# Patient Record
Sex: Female | Born: 1950
Health system: Southern US, Community
[De-identification: ages and names within clinical notes are randomized; demographics above are authoritative.]

## PROBLEM LIST (undated history)

## (undated) DIAGNOSIS — R55 Syncope and collapse: Secondary | ICD-10-CM

## (undated) DIAGNOSIS — E119 Type 2 diabetes mellitus without complications: Secondary | ICD-10-CM

## (undated) DIAGNOSIS — I10 Essential (primary) hypertension: Secondary | ICD-10-CM

## (undated) DIAGNOSIS — E871 Hypo-osmolality and hyponatremia: Secondary | ICD-10-CM

## (undated) DIAGNOSIS — Z992 Dependence on renal dialysis: Secondary | ICD-10-CM

## (undated) DIAGNOSIS — E785 Hyperlipidemia, unspecified: Secondary | ICD-10-CM

## (undated) DIAGNOSIS — D649 Anemia, unspecified: Secondary | ICD-10-CM

## (undated) DIAGNOSIS — D696 Thrombocytopenia, unspecified: Secondary | ICD-10-CM

## (undated) DIAGNOSIS — R413 Other amnesia: Secondary | ICD-10-CM

## (undated) DIAGNOSIS — N186 End stage renal disease: Secondary | ICD-10-CM

## (undated) DIAGNOSIS — I2510 Atherosclerotic heart disease of native coronary artery without angina pectoris: Secondary | ICD-10-CM

## (undated) DIAGNOSIS — H9190 Unspecified hearing loss, unspecified ear: Secondary | ICD-10-CM

## (undated) DIAGNOSIS — I255 Ischemic cardiomyopathy: Secondary | ICD-10-CM

## (undated) HISTORY — DX: Hypo-osmolality and hyponatremia: E87.1

## (undated) HISTORY — DX: Hyperlipidemia, unspecified: E78.5

## (undated) HISTORY — DX: Other amnesia: R41.3

## (undated) HISTORY — DX: Type 2 diabetes mellitus without complications: E11.9

## (undated) HISTORY — DX: Thrombocytopenia, unspecified: D69.6

## (undated) HISTORY — DX: Anemia, unspecified: D64.9

## (undated) HISTORY — DX: Unspecified hearing loss, unspecified ear: H91.90

---

## 2000-03-05 ENCOUNTER — Encounter: Payer: Self-pay | Admitting: Obstetrics and Gynecology

## 2000-03-05 ENCOUNTER — Encounter: Admission: RE | Admit: 2000-03-05 | Discharge: 2000-03-05 | Payer: Self-pay | Admitting: Obstetrics and Gynecology

## 2000-12-24 ENCOUNTER — Emergency Department (HOSPITAL_COMMUNITY): Admission: EM | Admit: 2000-12-24 | Discharge: 2000-12-24 | Payer: Self-pay | Admitting: *Deleted

## 2000-12-27 ENCOUNTER — Emergency Department (HOSPITAL_COMMUNITY): Admission: EM | Admit: 2000-12-27 | Discharge: 2000-12-27 | Payer: Self-pay | Admitting: Emergency Medicine

## 2000-12-31 ENCOUNTER — Emergency Department (HOSPITAL_COMMUNITY): Admission: EM | Admit: 2000-12-31 | Discharge: 2000-12-31 | Payer: Self-pay | Admitting: Emergency Medicine

## 2001-03-18 ENCOUNTER — Other Ambulatory Visit: Admission: RE | Admit: 2001-03-18 | Discharge: 2001-03-18 | Payer: Self-pay | Admitting: Obstetrics and Gynecology

## 2001-04-20 ENCOUNTER — Encounter: Admission: RE | Admit: 2001-04-20 | Discharge: 2001-04-20 | Payer: Self-pay | Admitting: Obstetrics and Gynecology

## 2001-04-20 ENCOUNTER — Encounter: Payer: Self-pay | Admitting: Obstetrics and Gynecology

## 2010-07-04 ENCOUNTER — Emergency Department (HOSPITAL_COMMUNITY): Admission: EM | Admit: 2010-07-04 | Discharge: 2010-07-04 | Payer: Self-pay | Admitting: Emergency Medicine

## 2011-03-10 ENCOUNTER — Inpatient Hospital Stay (HOSPITAL_COMMUNITY)
Admission: EM | Admit: 2011-03-10 | Discharge: 2011-03-20 | DRG: 853 | Disposition: A | Discharge status: Home Health | Payer: Self-pay | Attending: Family Medicine | Admitting: Family Medicine

## 2011-03-10 ENCOUNTER — Emergency Department (HOSPITAL_COMMUNITY): Payer: Self-pay

## 2011-03-10 DIAGNOSIS — A419 Sepsis, unspecified organism: Secondary | ICD-10-CM

## 2011-03-10 DIAGNOSIS — E1310 Other specified diabetes mellitus with ketoacidosis without coma: Secondary | ICD-10-CM | POA: Diagnosis present

## 2011-03-10 DIAGNOSIS — R578 Other shock: Secondary | ICD-10-CM

## 2011-03-10 DIAGNOSIS — A498 Other bacterial infections of unspecified site: Secondary | ICD-10-CM | POA: Diagnosis present

## 2011-03-10 DIAGNOSIS — E871 Hypo-osmolality and hyponatremia: Secondary | ICD-10-CM | POA: Diagnosis present

## 2011-03-10 DIAGNOSIS — R6521 Severe sepsis with septic shock: Secondary | ICD-10-CM | POA: Diagnosis present

## 2011-03-10 DIAGNOSIS — K5900 Constipation, unspecified: Secondary | ICD-10-CM | POA: Diagnosis not present

## 2011-03-10 DIAGNOSIS — N179 Acute kidney failure, unspecified: Secondary | ICD-10-CM

## 2011-03-10 DIAGNOSIS — R6520 Severe sepsis without septic shock: Secondary | ICD-10-CM | POA: Diagnosis present

## 2011-03-10 DIAGNOSIS — E1010 Type 1 diabetes mellitus with ketoacidosis without coma: Secondary | ICD-10-CM

## 2011-03-10 DIAGNOSIS — N10 Acute tubulo-interstitial nephritis: Secondary | ICD-10-CM | POA: Diagnosis present

## 2011-03-10 DIAGNOSIS — R634 Abnormal weight loss: Secondary | ICD-10-CM | POA: Diagnosis present

## 2011-03-10 DIAGNOSIS — E872 Acidosis: Secondary | ICD-10-CM

## 2011-03-10 DIAGNOSIS — E8779 Other fluid overload: Secondary | ICD-10-CM | POA: Diagnosis not present

## 2011-03-10 DIAGNOSIS — J9600 Acute respiratory failure, unspecified whether with hypoxia or hypercapnia: Secondary | ICD-10-CM | POA: Diagnosis present

## 2011-03-10 HISTORY — DX: Essential (primary) hypertension: I10

## 2011-03-10 LAB — RAPID URINE DRUG SCREEN, HOSP PERFORMED
Amphetamines: NOT DETECTED
Barbiturates: NOT DETECTED
Benzodiazepines: NOT DETECTED
Cocaine: NOT DETECTED
Opiates: NOT DETECTED
Tetrahydrocannabinol: NOT DETECTED

## 2011-03-10 LAB — URINALYSIS, ROUTINE W REFLEX MICROSCOPIC
Glucose, UA: 500 mg/dL — AB
Ketones, ur: 15 mg/dL — AB
Nitrite: NEGATIVE
Protein, ur: 100 mg/dL — AB
Specific Gravity, Urine: 1.025 (ref 1.005–1.030)
Urobilinogen, UA: 0.2 mg/dL (ref 0.0–1.0)
pH: 5.5 (ref 5.0–8.0)

## 2011-03-10 LAB — CBC
HCT: 40.7 % (ref 36.0–46.0)
MCH: 31.8 pg (ref 26.0–34.0)
MCHC: 36.6 g/dL — ABNORMAL HIGH (ref 30.0–36.0)
MCV: 86.8 fL (ref 78.0–100.0)
MCV: 86.5 fL (ref 78.0–100.0)
Platelets: 66 10*3/uL — ABNORMAL LOW (ref 150–400)
RBC: 3.99 MIL/uL (ref 3.87–5.11)
RDW: 12.7 % (ref 11.5–15.5)
WBC: 20.1 10*3/uL — ABNORMAL HIGH (ref 4.0–10.5)

## 2011-03-10 LAB — POCT I-STAT 3, VENOUS BLOOD GAS (G3P V)
Acid-base deficit: 9 mmol/L — ABNORMAL HIGH (ref 0.0–2.0)
O2 Saturation: 40 %
TCO2: 17 mmol/L (ref 0–100)
pCO2, Ven: 33.3 mmHg — ABNORMAL LOW (ref 45.0–50.0)
pO2, Ven: 25 mmHg — CL (ref 30.0–45.0)

## 2011-03-10 LAB — CK TOTAL AND CKMB (NOT AT ARMC)
CK, MB: 2.9 ng/mL (ref 0.3–4.0)
Relative Index: 2.6 — ABNORMAL HIGH (ref 0.0–2.5)
Total CK: 112 U/L (ref 7–177)

## 2011-03-10 LAB — COMPREHENSIVE METABOLIC PANEL
Albumin: 2.4 g/dL — ABNORMAL LOW (ref 3.5–5.2)
Alkaline Phosphatase: 134 U/L — ABNORMAL HIGH (ref 39–117)
BUN: 53 mg/dL — ABNORMAL HIGH (ref 6–23)
GFR calc Af Amer: 26 mL/min — ABNORMAL LOW (ref >60)
Potassium: 3.8 mEq/L (ref 3.5–5.1)
Sodium: 126 mEq/L — ABNORMAL LOW (ref 135–145)
Total Protein: 5.5 g/dL — ABNORMAL LOW (ref 6.0–8.3)

## 2011-03-10 LAB — BASIC METABOLIC PANEL
Calcium: 6.8 mg/dL — ABNORMAL LOW (ref 8.4–10.5)
Chloride: 102 mEq/L (ref 96–112)
Creatinine, Ser: 1.65 mg/dL — ABNORMAL HIGH (ref 0.4–1.2)
GFR calc Af Amer: 33 mL/min — ABNORMAL LOW (ref >60)
GFR calc Af Amer: 38 mL/min — ABNORMAL LOW (ref >60)
Potassium: 4.2 mEq/L (ref 3.5–5.1)

## 2011-03-10 LAB — GRAM STAIN

## 2011-03-10 LAB — DIFFERENTIAL
Eosinophils Relative: 0 % (ref 0–5)
Lymphs Abs: 0.3 10*3/uL — ABNORMAL LOW (ref 0.7–4.0)
Monocytes Relative: 4 % (ref 3–12)
Neutro Abs: 16 10*3/uL — ABNORMAL HIGH (ref 1.7–7.7)

## 2011-03-10 LAB — POCT CARDIAC MARKERS
CKMB, poc: 1.8 ng/mL (ref 1.0–8.0)
Troponin i, poc: <0.05 ng/mL (ref 0.00–0.09)

## 2011-03-10 LAB — GLUCOSE, CAPILLARY
Glucose-Capillary: 323 mg/dL — ABNORMAL HIGH (ref 70–99)
Glucose-Capillary: 207 mg/dL — ABNORMAL HIGH (ref 70–99)

## 2011-03-10 LAB — URINE MICROSCOPIC-ADD ON

## 2011-03-10 LAB — BRAIN NATRIURETIC PEPTIDE: Pro B Natriuretic peptide (BNP): 1125 pg/mL — ABNORMAL HIGH (ref 0.0–100.0)

## 2011-03-10 LAB — LIPASE, BLOOD: Lipase: <10 U/L — ABNORMAL LOW (ref 11–59)

## 2011-03-10 LAB — APTT: aPTT: 32 seconds (ref 24–37)

## 2011-03-10 LAB — TROPONIN I: Troponin I: 0.22 ng/mL — ABNORMAL HIGH (ref 0.00–0.06)

## 2011-03-11 ENCOUNTER — Inpatient Hospital Stay (HOSPITAL_COMMUNITY): Payer: Self-pay

## 2011-03-11 LAB — BASIC METABOLIC PANEL
BUN: 33 mg/dL — ABNORMAL HIGH (ref 6–23)
CO2: 15 mEq/L — ABNORMAL LOW (ref 19–32)
CO2: 24 mEq/L (ref 19–32)
Calcium: 7.3 mg/dL — ABNORMAL LOW (ref 8.4–10.5)
Calcium: 8 mg/dL — ABNORMAL LOW (ref 8.4–10.5)
Chloride: 111 mEq/L (ref 96–112)
Creatinine, Ser: 0.86 mg/dL (ref 0.4–1.2)
GFR calc Af Amer: >60 mL/min (ref >60)
GFR calc Af Amer: 58 mL/min — ABNORMAL LOW (ref >60)
GFR calc non Af Amer: 48 mL/min — ABNORMAL LOW (ref >60)
Glucose, Bld: 322 mg/dL — ABNORMAL HIGH (ref 70–99)
Glucose, Bld: 170 mg/dL — ABNORMAL HIGH (ref 70–99)
Potassium: 3.3 mEq/L — ABNORMAL LOW (ref 3.5–5.1)
Sodium: 134 mEq/L — ABNORMAL LOW (ref 135–145)
Sodium: 131 mEq/L — ABNORMAL LOW (ref 135–145)

## 2011-03-11 LAB — CBC
HCT: 36.7 % (ref 36.0–46.0)
MCHC: 35.7 g/dL (ref 30.0–36.0)
Platelets: 66 10*3/uL — ABNORMAL LOW (ref 150–400)
RDW: 12.7 % (ref 11.5–15.5)
WBC: 17.6 10*3/uL — ABNORMAL HIGH (ref 4.0–10.5)

## 2011-03-11 LAB — COMPREHENSIVE METABOLIC PANEL
ALT: 33 U/L (ref 0–35)
AST: 36 U/L (ref 0–37)
Albumin: 1.9 g/dL — ABNORMAL LOW (ref 3.5–5.2)
Alkaline Phosphatase: 105 U/L (ref 39–117)
Calcium: 7 mg/dL — ABNORMAL LOW (ref 8.4–10.5)
GFR calc Af Amer: 44 mL/min — ABNORMAL LOW (ref >60)
Potassium: 3.4 mEq/L — ABNORMAL LOW (ref 3.5–5.1)
Sodium: 133 mEq/L — ABNORMAL LOW (ref 135–145)
Total Protein: 4.7 g/dL — ABNORMAL LOW (ref 6.0–8.3)

## 2011-03-11 LAB — CARDIAC PANEL(CRET KIN+CKTOT+MB+TROPI)
Relative Index: 3.1 — ABNORMAL HIGH (ref 0.0–2.5)
Relative Index: INVALID (ref 0.0–2.5)
Total CK: 26 U/L (ref 7–177)
Troponin I: 0.05 ng/mL (ref 0.00–0.06)

## 2011-03-11 LAB — GLUCOSE, CAPILLARY
Glucose-Capillary: 235 mg/dL — ABNORMAL HIGH (ref 70–99)
Glucose-Capillary: 163 mg/dL — ABNORMAL HIGH (ref 70–99)
Glucose-Capillary: 185 mg/dL — ABNORMAL HIGH (ref 70–99)
Glucose-Capillary: 201 mg/dL — ABNORMAL HIGH (ref 70–99)
Glucose-Capillary: 162 mg/dL — ABNORMAL HIGH (ref 70–99)

## 2011-03-11 LAB — TSH: TSH: 0.843 u[IU]/mL (ref 0.350–4.500)

## 2011-03-12 ENCOUNTER — Inpatient Hospital Stay (HOSPITAL_COMMUNITY): Payer: Self-pay

## 2011-03-12 LAB — CBC
HCT: 33.1 % — ABNORMAL LOW (ref 36.0–46.0)
Hemoglobin: 11.7 g/dL — ABNORMAL LOW (ref 12.0–15.0)
MCH: 30.4 pg (ref 26.0–34.0)
MCHC: 35.3 g/dL (ref 30.0–36.0)
MCV: 86 fL (ref 78.0–100.0)

## 2011-03-12 LAB — BASIC METABOLIC PANEL
BUN: 22 mg/dL (ref 6–23)
CO2: 15 mEq/L — ABNORMAL LOW (ref 19–32)
Chloride: 109 mEq/L (ref 96–112)
Creatinine, Ser: 0.99 mg/dL (ref 0.4–1.2)
Glucose, Bld: 118 mg/dL — ABNORMAL HIGH (ref 70–99)

## 2011-03-12 LAB — GLUCOSE, CAPILLARY: Glucose-Capillary: 116 mg/dL — ABNORMAL HIGH (ref 70–99)

## 2011-03-12 LAB — URINE CULTURE: Culture  Setup Time: 201204241708

## 2011-03-12 LAB — POCT I-STAT 3, ART BLOOD GAS (G3+)
Bicarbonate: 14.6 mEq/L — ABNORMAL LOW (ref 20.0–24.0)
Patient temperature: 98.1
pH, Arterial: 7.454 — ABNORMAL HIGH (ref 7.350–7.400)
pO2, Arterial: 59 mmHg — ABNORMAL LOW (ref 80.0–100.0)

## 2011-03-12 MED ORDER — IOHEXOL 300 MG/ML  SOLN
80.0000 mL | Freq: Once | INTRAMUSCULAR | Status: AC | PRN
  Administered 2011-03-12: 80 mL via INTRAVENOUS

## 2011-03-13 DIAGNOSIS — N12 Tubulo-interstitial nephritis, not specified as acute or chronic: Secondary | ICD-10-CM

## 2011-03-13 LAB — GLUCOSE, CAPILLARY
Glucose-Capillary: 94 mg/dL (ref 70–99)
Glucose-Capillary: 147 mg/dL — ABNORMAL HIGH (ref 70–99)
Glucose-Capillary: 118 mg/dL — ABNORMAL HIGH (ref 70–99)

## 2011-03-13 LAB — BASIC METABOLIC PANEL
BUN: 18 mg/dL (ref 6–23)
CO2: 17 mEq/L — ABNORMAL LOW (ref 19–32)
Chloride: 105 mEq/L (ref 96–112)
Creatinine, Ser: 0.88 mg/dL (ref 0.4–1.2)
Glucose, Bld: 94 mg/dL (ref 70–99)

## 2011-03-13 LAB — CBC
HCT: 30.7 % — ABNORMAL LOW (ref 36.0–46.0)
MCH: 30.2 pg (ref 26.0–34.0)
MCV: 85 fL (ref 78.0–100.0)
Platelets: 48 10*3/uL — ABNORMAL LOW (ref 150–400)
RBC: 3.61 MIL/uL — ABNORMAL LOW (ref 3.87–5.11)

## 2011-03-13 LAB — MAGNESIUM: Magnesium: 2.1 mg/dL (ref 1.5–2.5)

## 2011-03-14 LAB — CBC
HCT: 33 % — ABNORMAL LOW (ref 36.0–46.0)
Platelets: 57 10*3/uL — ABNORMAL LOW (ref 150–400)
RDW: 13 % (ref 11.5–15.5)
WBC: 12.8 10*3/uL — ABNORMAL HIGH (ref 4.0–10.5)

## 2011-03-14 LAB — GLUCOSE, CAPILLARY
Glucose-Capillary: 95 mg/dL (ref 70–99)
Glucose-Capillary: 89 mg/dL (ref 70–99)

## 2011-03-14 LAB — PHOSPHORUS: Phosphorus: 2.9 mg/dL (ref 2.3–4.6)

## 2011-03-14 LAB — BASIC METABOLIC PANEL
GFR calc non Af Amer: >60 mL/min (ref >60)
Glucose, Bld: 84 mg/dL (ref 70–99)
Potassium: 3.6 mEq/L (ref 3.5–5.1)
Sodium: 129 mEq/L — ABNORMAL LOW (ref 135–145)

## 2011-03-14 LAB — MAGNESIUM: Magnesium: 1.7 mg/dL (ref 1.5–2.5)

## 2011-03-15 ENCOUNTER — Inpatient Hospital Stay (HOSPITAL_COMMUNITY): Payer: Self-pay

## 2011-03-15 LAB — GLUCOSE, CAPILLARY
Glucose-Capillary: 122 mg/dL — ABNORMAL HIGH (ref 70–99)
Glucose-Capillary: 208 mg/dL — ABNORMAL HIGH (ref 70–99)
Glucose-Capillary: 207 mg/dL — ABNORMAL HIGH (ref 70–99)

## 2011-03-15 LAB — CBC
HCT: 33.5 % — ABNORMAL LOW (ref 36.0–46.0)
Hemoglobin: 11.6 g/dL — ABNORMAL LOW (ref 12.0–15.0)
MCV: 87 fL (ref 78.0–100.0)
RBC: 3.85 MIL/uL — ABNORMAL LOW (ref 3.87–5.11)
WBC: 11.6 10*3/uL — ABNORMAL HIGH (ref 4.0–10.5)

## 2011-03-15 LAB — BASIC METABOLIC PANEL
BUN: 11 mg/dL (ref 6–23)
Chloride: 102 mEq/L (ref 96–112)
Glucose, Bld: 137 mg/dL — ABNORMAL HIGH (ref 70–99)
Potassium: 3.5 mEq/L (ref 3.5–5.1)

## 2011-03-16 ENCOUNTER — Encounter (HOSPITAL_COMMUNITY): Payer: Self-pay | Admitting: Radiology

## 2011-03-16 ENCOUNTER — Inpatient Hospital Stay (HOSPITAL_COMMUNITY): Payer: Self-pay

## 2011-03-16 LAB — GLUCOSE, CAPILLARY
Glucose-Capillary: 136 mg/dL — ABNORMAL HIGH (ref 70–99)
Glucose-Capillary: 121 mg/dL — ABNORMAL HIGH (ref 70–99)

## 2011-03-16 LAB — CBC
Hemoglobin: 11.5 g/dL — ABNORMAL LOW (ref 12.0–15.0)
MCH: 30.2 pg (ref 26.0–34.0)
MCHC: 34.1 g/dL (ref 30.0–36.0)
MCV: 88.5 fL (ref 78.0–100.0)

## 2011-03-16 LAB — BASIC METABOLIC PANEL
BUN: 8 mg/dL (ref 6–23)
Chloride: 103 mEq/L (ref 96–112)
Creatinine, Ser: 0.65 mg/dL (ref 0.4–1.2)
Glucose, Bld: 109 mg/dL — ABNORMAL HIGH (ref 70–99)

## 2011-03-16 MED ORDER — IOHEXOL 300 MG/ML  SOLN: 80.0000 mL | Freq: Once | INTRAMUSCULAR | Status: AC | PRN

## 2011-03-16 NOTE — Consult Note (Signed)
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  HISTORY OF PRESENT ILLNESS:  [name] [name] is a 60 year old Caucasian female with a past medical history significant for diabetes which has been untreated as well as has hypertension who was admitted to the Teaching Service with Family Medicine with hyperglycemia and altered mental status.  She had apparently become incoherent, had slurred speech and fallen on the bathtub.  She had antecedent history of 4 days ofnausea and vomiting and decreased oral intake.  She had lost 100 pounds last year becoming more fatigued and depressed.  Upon talking to the patient today, she actually states she has had flank pain for more than a year.  In the emergency room, she was found to have pyuria and was given Rocephin and fluid resuscitated.  She was admitted to the Teaching Service and ultimately at the intensive care unit.  In the interim, imaging of her including CT of the abdomen and pelvis has been done on March 12, 2011, and this shows near-complete replacement of her right kidney with a gas and emphysematous pyelonephritis.  Some of this gas collection extends into the retroperitoneum along the inferior vena cava and the aorta.  There is also emphysematous cystitis with gas in the posterolateral bladder walls.  The patient's culture from urine has yielded an E-coli that is actually fairly sensitive.  Her blood cultures have remained sterile.  We were  asked to assist in the management work of this patient with emphysematous pyelonephritis that is severe.  PAST MEDICAL HISTORY: 1. Untreated diabetes mellitus. 2. Hypertension.  PAST SURGICAL HISTORY:  None.  SOCIAL HISTORY:  The patient lives with other husband, denies tobacco, alcohol or illicit drug use.  FAMILY HISTORY:  Mother and father both had heart disease.  REVIEW OF SYSTEMS:  Described in history of illness, otherwise, 12-point review of systems is negative.  ALLERGIES:  No known drug allergies.  CURRENT MEDICATIONS:  Ceftriaxone, Lasix, Haldol, metronidazole, pantoprazole, potassium chloride, Tylenol, dobutamine, morphine sulfate.  PHYSICAL EXAMINATION:  VITAL SIGNS:  Temperature max in last 24 hours is 98.1, temperature current 97.8, blood pressure 119/59, pulse 73, respirations 22, pulse ox 94% on room air.  GENERAL:  Quite pleasant lady is eating her breakfast. HEENT:  Normocephalic, atraumatic.  Pupils are equal, round and reactive to light.  Sclerae anicteric.  Oropharynx clear. NECK:  Supple. CARDIOVASCULAR:  Regular rate and rhythm.  No murmurs, gallops, or rubs. LUNGS:  Clear to auscultation bilaterally without wheezes, rhonchi or rales. ABDOMEN:  Soft but tender to palpation in left upper and lower quadrants.  She has had some left-sided flank pain and  CVA tenderness. LOWER EXTREMITIES:  Minimal edema. NEUROLOGICAL:  Nonfocal.  LABORATORY DATA:  CT scan as described above with near complete replacement of the right renal parenchyma by gas consistent with emphysematous pyelonephritis and extension of the gas collection to the retroperitoneum with extension to the inferior vena cava and aorta along the level of the liver and the aortic bifurcation, emphysematous cystitis and gas in posterolateral bladder walls, bilateral pleural effusions right greater than left.  LABORATORY DATA:  CBC with differential white count 12.3, hemoglobin 10.9, platelets 48.   Metabolic panel sodium 129, potassium 3.1, chloride 105, bicarb 17, BUN and creatinine 18 and 0.88, calcium 7.5.  Blood gas done yesterday 745, 20.7 and 59.  Serum for gallstone done on March 12, 2011, 33.55.  MICROBIOLOGICAL DATA:  Urine cultures from March 10, 2011, grew E-coli which was resistant to ampicillin but otherwise sensitive to ampicillin, cefazolin, ceftriaxone, ciprofloxacin, gentamicin with Floxin, Macrobid, Tobramycin and Bactrim.  Blood culture from March 10, 2011, negative.  IMPRESSION AND RECOMMENDATIONS:  This is a 60 year old lady with severe emphysematous pyelonephritis with near replacement of one of her kidneys due to this infection. 1. Severe emphysematous pyelonephritis:  In my mind, this patient     needs a nephrectomy to clear this.  I do not see I am going to     clear this with prolonged antibiotics, she is clearly critically     ill.  I agree with the Rocephin.  The Escherichia coli is actually     likely the culprit that is causing the gas as well.  It is well-     known to cause emphysematous cystitis and pyelonephritis.  We had     added Flagyl to cover anaerobes.  This is not completely     unreasonable although there is not clear evidence of the anaerobes     involved here and as mentioned the Escherichia coli can do this     completely by itself without anaerobic involvement.  Again, the     main issue in my     mind is the need for nephrectomy. 2. Thrombocytopenia was present on admission and is likely due to DIC.  Thank you for this Infectious Disease consultation.     Acey Lav, MD     CV/MEDQ  D:  03/13/2011  T:  03/13/2011  Job:  [id number]  cc:   Deniece Portela A. Sheffield Slider, M.D. Martina Sinner, MD  Electronically Signed by Paulette Blanch DAM MD on 03/16/2011 09:09:19 PM

## 2011-03-17 ENCOUNTER — Other Ambulatory Visit: Payer: Self-pay | Admitting: Urology

## 2011-03-17 DIAGNOSIS — N12 Tubulo-interstitial nephritis, not specified as acute or chronic: Secondary | ICD-10-CM

## 2011-03-17 HISTORY — PX: OTHER SURGICAL HISTORY: SHX169

## 2011-03-17 LAB — ABO/RH: ABO/RH(D): O POS

## 2011-03-17 LAB — OSMOLALITY, URINE: Osmolality, Ur: 340 mOsm/kg — ABNORMAL LOW (ref 390–1090)

## 2011-03-17 LAB — OSMOLALITY: Osmolality: 279 mOsm/kg (ref 275–300)

## 2011-03-17 LAB — CBC
HCT: 31.7 % — ABNORMAL LOW (ref 36.0–46.0)
Hemoglobin: 10.7 g/dL — ABNORMAL LOW (ref 12.0–15.0)
MCH: 30.3 pg (ref 26.0–34.0)
MCHC: 33.8 g/dL (ref 30.0–36.0)
MCV: 89.8 fL (ref 78.0–100.0)
RDW: 13.4 % (ref 11.5–15.5)

## 2011-03-17 LAB — GLUCOSE, CAPILLARY
Glucose-Capillary: 146 mg/dL — ABNORMAL HIGH (ref 70–99)
Glucose-Capillary: 177 mg/dL — ABNORMAL HIGH (ref 70–99)
Glucose-Capillary: 79 mg/dL (ref 70–99)

## 2011-03-17 LAB — CULTURE, BLOOD (ROUTINE X 2)
Culture: NO GROWTH
Culture: NO GROWTH

## 2011-03-17 LAB — BASIC METABOLIC PANEL
BUN: 7 mg/dL (ref 6–23)
Calcium: 7.8 mg/dL — ABNORMAL LOW (ref 8.4–10.5)
Creatinine, Ser: 0.79 mg/dL (ref 0.4–1.2)
GFR calc Af Amer: >60 mL/min (ref >60)
GFR calc non Af Amer: >60 mL/min (ref >60)

## 2011-03-17 LAB — POCT I-STAT GLUCOSE: Operator id: 153281

## 2011-03-18 LAB — GLUCOSE, CAPILLARY
Glucose-Capillary: 103 mg/dL — ABNORMAL HIGH (ref 70–99)
Glucose-Capillary: 115 mg/dL — ABNORMAL HIGH (ref 70–99)
Glucose-Capillary: 135 mg/dL — ABNORMAL HIGH (ref 70–99)
Glucose-Capillary: 78 mg/dL (ref 70–99)

## 2011-03-18 LAB — BASIC METABOLIC PANEL
CO2: 26 mEq/L (ref 19–32)
Calcium: 7.8 mg/dL — ABNORMAL LOW (ref 8.4–10.5)
Chloride: 100 mEq/L (ref 96–112)
Creatinine, Ser: 0.53 mg/dL (ref 0.4–1.2)
GFR calc Af Amer: >60 mL/min (ref >60)
Glucose, Bld: 108 mg/dL — ABNORMAL HIGH (ref 70–99)

## 2011-03-18 LAB — CBC
HCT: 30.7 % — ABNORMAL LOW (ref 36.0–46.0)
Hemoglobin: 10.4 g/dL — ABNORMAL LOW (ref 12.0–15.0)
Hemoglobin: 10.8 g/dL — ABNORMAL LOW (ref 12.0–15.0)
MCH: 30.4 pg (ref 26.0–34.0)
MCHC: 33.9 g/dL (ref 30.0–36.0)
MCV: 91.7 fL (ref 78.0–100.0)
Platelets: 318 10*3/uL (ref 150–400)
RBC: 3.42 MIL/uL — ABNORMAL LOW (ref 3.87–5.11)
RBC: 3.5 MIL/uL — ABNORMAL LOW (ref 3.87–5.11)
WBC: 9.7 10*3/uL (ref 4.0–10.5)

## 2011-03-18 LAB — BETA-HYDROXYBUTYRIC ACID

## 2011-03-19 LAB — GLUCOSE, CAPILLARY
Glucose-Capillary: 173 mg/dL — ABNORMAL HIGH (ref 70–99)
Glucose-Capillary: 132 mg/dL — ABNORMAL HIGH (ref 70–99)
Glucose-Capillary: 142 mg/dL — ABNORMAL HIGH (ref 70–99)

## 2011-03-19 LAB — BASIC METABOLIC PANEL
BUN: 5 mg/dL — ABNORMAL LOW (ref 6–23)
Calcium: 8.1 mg/dL — ABNORMAL LOW (ref 8.4–10.5)
Creatinine, Ser: 0.54 mg/dL (ref 0.4–1.2)
GFR calc Af Amer: >60 mL/min (ref >60)
GFR calc non Af Amer: >60 mL/min (ref >60)

## 2011-03-19 LAB — CBC
MCH: 29.6 pg (ref 26.0–34.0)
MCV: 90.1 fL (ref 78.0–100.0)
Platelets: 359 10*3/uL (ref 150–400)
RDW: 12.7 % (ref 11.5–15.5)

## 2011-03-20 LAB — BASIC METABOLIC PANEL
CO2: 31 mEq/L (ref 19–32)
Calcium: 8.4 mg/dL (ref 8.4–10.5)
Chloride: 102 mEq/L (ref 96–112)
Glucose, Bld: 95 mg/dL (ref 70–99)
Sodium: 136 mEq/L (ref 135–145)

## 2011-03-20 LAB — CBC
HCT: 29.6 % — ABNORMAL LOW (ref 36.0–46.0)
Hemoglobin: 9.8 g/dL — ABNORMAL LOW (ref 12.0–15.0)
MCHC: 33.1 g/dL (ref 30.0–36.0)

## 2011-03-20 LAB — GLUCOSE, CAPILLARY
Glucose-Capillary: 154 mg/dL — ABNORMAL HIGH (ref 70–99)
Glucose-Capillary: 119 mg/dL — ABNORMAL HIGH (ref 70–99)
Glucose-Capillary: 141 mg/dL — ABNORMAL HIGH (ref 70–99)

## 2011-03-20 NOTE — Discharge Summary (Signed)
[name] [name], [name] [name]               ACCOUNT NO.:  [id number]  MEDICAL RECORD NO.:  [id number]           PATIENT TYPE:  I  LOCATION:  2601                         FACILITY:  [facility]  PHYSICIAN:  Leighton Roach Yasaman Kolek, M.D.DATE OF BIRTH:  May 29, 1951  DATE OF ADMISSION:  03/10/2011 DATE OF DISCHARGE:                              DISCHARGE SUMMARY   Please refer to history and physical for detailed history of present illness.  Briefly, this is a 60 year old female who has not seen a primary care provider in over 6 years who presented to the ED with altered level of consciousness, nausea and vomiting, and hyperglycemia. Upon admission, the patient did have an elevated anion gap, acidosis, and ketosis.  We started the patient on Glucommander to treat for DKA and we ordered urine cultures and blood cultures because the patient had a mild low-grade fever.  The patient was hypotensive, her blood pressures were running 70s to 80s systolic over 40s to 50s diastolic and she had a white count of 17 and she was tachycardic and tachypneic. Therefore, she did meet SIRS criteria initially.  When her urinalysis returned, there was a UTI found and thus the patient was considered uroseptic and the code sepsis was called in the ED.  The patient was started empirically on vancomycin and Zosyn, and CCM was called for consult.  The patient's blood pressures continued to remain low, despite receiving 4-5 liters of IV fluids both in the ED and when she reached ICU.  The next morning, CCM recommended that the fluid be discontinued due to possible pulmonary edema found on her chest x-ray that morning. Therefore, the patient's fluids were KVO'd, however, the patient's blood pressures continued to drop after the fluids were stopped, therefore, a dobutamine drip was started and the patient's blood pressures were stable on the dobutamine drip for 12 hours.  Infectious Disease consult was appreciated and they initially  recommended to change from vancomycin and Zosyn to Avelox, but when the urine culture returned with E. coli, Infectious Disease changed the antibiotic to Rocephin 1 g daily.  The dobutamine drip was stopped and the patient's blood pressures remained stable.  She was then transferred to the floor.  A renal ultrasound was ordered at the time of admission which showed abnormal shadowing emanating from the right kidney, question abscess with gas forming organisms or emphysematous pyelonephritis, recommend a CT scan, that was March 11, 2011.  On March 12, 2011, a CT abdomen and pelvis with contrast showed: Near complete replacement of the right renal parenchyma by gas consistent with emphysematous pyelonephritis, extension of gas collection into the retroperitoneum with extension along the IVC and aorta from the level of the liver to the aortic bifurcation, emphysematous cystitis with gas in the posterolateral bladder wall and bilateral pleural effusions greater on the right, cannot exclude a right lower lobe pneumonia.  Urology was on board and at that time he wanted to discuss the case with Interventional Radiology and his partner.  It was decided that we would repeat the CT scan on Monday, March 16, 2011, to see if the emphysematous  pyelonephritis was improving overall.  On March 16, 2011, a CT abdomen and pelvis with contrast was repeated and showed no substantial interval change in appearance of the right kidney. Imaging features remained consistent with emphysematous pyelonephritis. The gas associated with the posterior bladder wall on the previous study has almost completely resolved.  As of March 16, 2011, Urology has decided to pursue nephrectomy on either Mar 17, 2011, or Mar 18, 2011.  The patient is now under the  excellent care of the new [facility] Teaching Service team for the month of May. Please call me if you have any questions or  concerns.    ______________________________ [name], MD   ______________________________ Leighton Roach Anaia Frith, M.D.    ID/MEDQ  D:  03/17/2011  T:  03/17/2011  Job:  [id number]  Electronically Signed by [name] MD on 03/18/2011 78:29:56 PM Electronically Signed by Acquanetta Belling M.D. on 03/20/2011 11:33:50 AM

## 2011-03-21 LAB — CROSSMATCH
ABO/RH(D): O POS
Antibody Screen: NEGATIVE
Unit division: 0

## 2011-03-24 NOTE — Discharge Summary (Signed)
[name] [name], [name] [name]               ACCOUNT NO.:  [id number]  MEDICAL RECORD NO.:  [id number]           PATIENT TYPE:  I  LOCATION:  5526                         FACILITY:  [facility]  PHYSICIAN:  Nestor Ramp, MD        DATE OF BIRTH:  10/02/1951  DATE OF ADMISSION:  03/10/2011 DATE OF DISCHARGE:  03/20/2011                              DISCHARGE SUMMARY   PRIMARY CARE PROVIDER:  None.  REASON FOR HOSPITALIZATION:  Diabetic ketoacidosis.  DISCHARGE DIAGNOSES: 1. Known type 2 diabetes mellitus in diabetic ketoacidosis. 2. Emphysematous pyelonephritis.  DISCHARGE MEDICATIONS: 1. Tylenol 650 mg p.o. q.8 h. p.r.n. pain. 2. Colace 100 mg p.o. b.i.d. for constipation. 3. Lantus 20 units subcutaneously q.a.m. 4. NovoLog 1 unit for every 50 blood sugar greater than 150 t.i.d.     with meals. 5. Vicodin 5/500 one to two tablets p.o. q.6 h. p.r.n. pain. 6. Ibuprofen 200 mg one to two tablets q.4 h. p.r.n. pain. 7. Antibiotic.CONSULTS:  Urology.  PROCEDURES: 1. Right nephrectomy on Mar 17, 2011. 2. CT abdomen and pelvis on March 12, 2011, showing a replacement of     the right renal parenchyma by gas consistent with emphysematous     pyelonephritis and extension of the gas collection into the     retroperitoneum as well as emphysematous cystitis.  Repeat CT     abdomen and pelvis on March 16, 2011, showed no substantial     interval change in the appearance of the right kidney and imaging     consistent with emphysematous pyelonephritis with decrease in the     amount of retroperitoneal gas and resolution of gas in the     posterior bladder wall.  PERTINENT LABORATORY DATA:  From Mar 17, 2011, to Mar 20, 2011, blood culture no growth x5 days.  CBC on the date of discharge white blood count 5.3, hemoglobin 9.8.  BRIEF HOSPITAL COURSE:  This is a 60 year old female who presented with nausea, vomiting, confusion, hyperglycemia, and hypotension. She is a known diabetic and was in diabetic  ketoacidosis. She also had a diagnosis of right emphysematous pyelonephritis.   Please refer to the interim summary regarding the patient's hospital course from the date of admission on March 10, 2011, to March 16, 2011.   I followed the patient from Mar 17, 2011, to her time of discharge. 1. Type 2 diabetes mellitus in diabetic ketoacidosis.  The patient's     gap closed on March 10, 2011. Following the interim discharge summary, the patient did well for the     remainder of her hospitalization regarding her diabetes.  At the     time of discharge, the patient was on 20 units of Lantus in the     morning and was on a sliding scale insulin Novolog regimen.  The     patient was requiring 0-2 units for mealtime coverage at the time     of discharge. 2. Emphysematous pyelonephritis.  No improvement of emphysematous pyelonephritis on repeat CT scan despite several days of antibiotics. So Urology perfromed right nephrectomy on 03/16/2010. Surgery went well without  any operative or postoperative     complications. Please refer to operative report for full details. The patient remained afebrile and had good urine     output and good pain control.   At the time of this dictation, biopsy results of right kidney were still pending. Still questionable whether emphysematous pyelonephritis was caused by an     infection versus malignancy.   At the time of discharge, the patient's antibiotic was     switched to Ciprofloxacin and the patient was asked to continue it for 17 days.   DISCHARGE INSTRUCTIONS:   Follow-up with Urology in 1-2 weeks. The patient was also asked to make a followup appointment and to establish care with a primary care provider in 1-2 weeks.  The patient was given options including the option to be followed at the [facility].  FOLLOWUP ISSUES: 1. Follow up on the kidney biopsy results. 2. Follow up on control of type 2 diabetes.  DISCHARGE CONDITION:  The  patient was discharged to her sister's home with home health nursing arrangements.     ______________________________ Priscella Mann, MD   ______________________________ Nestor Ramp, MD    AO/MEDQ  D:  03/20/2011  T:  03/20/2011  Job:  [id number]  Electronically Signed by Priscella Mann MD on 03/21/2011 11:07:03 AM Electronically Signed by Denny Levy MD on 03/24/2011 09:54:06 AM

## 2011-03-30 NOTE — Op Note (Signed)
NAMEHELI, [name] [name]               ACCOUNT NO.:  [id number]  MEDICAL RECORD NO.:  [id number]           PATIENT TYPE:  LOCATION:                                 FACILITY:  PHYSICIAN:  Heloise Purpura, MD      DATE OF BIRTH:  [birth year]  DATE OF PROCEDURE:  03/17/2011 DATE OF DISCHARGE:                              OPERATIVE REPORT   PREOPERATIVE DIAGNOSIS:  Emphysematous right pyelonephritis.  POSTOPERATIVE DIAGNOSIS:  Emphysematous right pyelonephritis.  PROCEDURE:  Right open nephrectomy.  SURGEON:  Martina Sinner, MD.  ASSISTANT:  Heloise Purpura, MD.  ANESTHESIA:  General.  COMPLICATIONS:  None.  ESTIMATED BLOOD LOSS:  300 mL.  INTRAVENOUS FLUIDS:  2400 mL of crystalloid.  SPECIMEN:  Right kidney.  DISPOSITION OF SPECIMEN:  Pathology.  DRAINS:  #19 Blake retroperitoneal drain.  INDICATIONS:  [name] [name] is a 60 year old female who was recently admitted to the hospital with altered mental status changes.  She underwent CT imaging and renal ultrasound imaging which revealed evidence of severe emphysematous pyelonephritis with almost total destruction of her right kidney and evidence of air extending into her retroperitoneum.  She was clinically stable and initially managed with intravenous antibiotics.  Her urine culture subsequently returned positive for pansensitive E. coli.  She underwent initial conservative management and re-imaging, which revealed persistent severe emphysematous pyelonephritis.  It was recommended that she undergo right nephrectomy for definitive treatment of her infection.  The potential risks, complications, and alternative treatment options were discussed in detail and informed consent obtained.  DESCRIPTION OF PROCEDURE:  The patient was taken to the operating room and a general anesthetic was administered, where she had been maintained on culture specific antibiotics and was now placed in the supine position with her abdomen prepped  and draped in the usual sterile fashion.  A preoperative time-out was performed.  A site was selected approximately 2 fingerbreadths below the right costal margin and an incision was made in the right subcostal region and carried down through the subcutaneous tissue with electrocautery.  The anterior rectus fascia was then divided along with the underlying rectus muscle fibers.  The posterior rectus sheath was then sharply divided allowing entry into the peritoneal cavity.  The fascial layers of the abdomen and muscular layers of the abdomen were then opened along the length of the incision. Self-retaining Bookwalter retractor was placed.  Inspection of the abdomen revealed a very enlarged and firm right kidney.  The adjacent colon did appear to be somewhat inflamed and portions of the liver were densely adherent to the anterior peritoneal surface of the right kidney. Using careful right-angle dissection, the colon was mobilized medially thereby exposing the duodenum and the entire kidney.  The hepatorenal ligaments were also divided allowing the liver to be mobilized and retracted superiorly.  The duodenum was then carefully released from the kidney, which did require entry into Gerota's fascia.  Once the duodenum was mobilized adequately, it was also retracted carefully medially thereby exposing the entire kidney.  The lateral attachments to the kidney were then entered allowing the posterior and lateral kidney to be carefully freed from the  surrounding structures.  The ureter was not readily identified during the inferior dissection due to the extensive inflammatory reaction that was present.  There was noted to be a small amount of gross purulence during the procedure as well.  On further dissection medially, the inferior vena caval was identified and dissection of the inflamed perinephric tissue was performed over the inferior vena cava exposing it superiorly toward the main renal  hilum. The superior attachments of the kidney were carefully taken down with cautery dissection under right-angle direction.  Eventually, the main renal hilum was able to be isolated away from the surrounding structures, although the renal artery and renal vein were densely adherent to each other due to the intense inflammatory reaction.  At this point, it was felt that en bloc division of the renal pedicle would be prudent and therefore a 45 mm Flex-ETS stapler with the vascular staple load was used to staple and divide the main renal artery and renal vein en bloc.  The kidney specimen was able to be removed and the renal fossa was copiously irrigated with antibiotic irrigation.  A #19 Blake drain was brought through a separate stab incision in the right lower quadrant and positioned appropriately within the renal fossa.  It was secured to the skin with a nylon suture.  Additional irrigation was utilized and hemostasis appeared excellent.  The ascending colon was placed back into its normal anatomic position and preparations were made for closure.  The abdominal wall was closed in 2 layers with a running #1 looped PDS used to close the transversus abdominis and internal oblique layers along with the posterior rectus sheath.  A second #1 looped PDS suture was used to close the external oblique and anterior rectus fascia.  The superficial wound was then copiously irrigated and the skin was reapproximated with staples.  Sterile dressings were applied.  The patient appeared to tolerate the procedure well without complications.  She remained hemodynamically stable throughout the procedure.     Heloise Purpura, MD     LB/MEDQ  D:  03/17/2011  T:  03/18/2011  Job:  [id number]  Electronically Signed by Heloise Purpura MD on 03/30/2011 08:00:13 PM

## 2011-04-05 NOTE — H&P (Signed)
[name] [name], [name] [name]               ACCOUNT NO.:  [id number]  MEDICAL RECORD NO.:  [id number]           PATIENT TYPE:  I  LOCATION:  2312                         FACILITY:  [facility]  PHYSICIAN:  Veryl Winemiller A. Sheffield Slider, M.D.    DATE OF BIRTH:  1951-09-29  DATE OF ADMISSION:  03/10/2011 DATE OF DISCHARGE:                             HISTORY & PHYSICAL   PRIMARY CARE PROVIDER:  None, unassigned.  CHIEF COMPLAINT:  Hyperglycemia with altered mental status.  HISTORY OF PRESENT ILLNESS:  This is a 60 year old female with a history of diabetes who was not seen a physician in over 5 years, presenting with hyperglycemia, nausea and vomiting.  The patient is a poor historian.  Per family members, the patient has become incoherent with slurred speech, this started 2 days ago.  Yesterday, the patient fell into a bathtub.  She did not hit her head or lose consciousness. Additionally, the patient has had a 4-day history of nausea and vomiting and decreased p.o. intake.  Per family, the patient is "sluggish" at baseline.  She has also lost over 100 pounds in the last year and has becoming more fatigued and "depressed."  Sister checks the patient's CBGs or blood glucoses when she comes to visit her.  Lately, CBGs have been too high to detect.  ED COURSE:  The patient was found to have UTI.  She received one dose of IV ceftriaxone and for her low blood pressure, she received 2 liters of IV fluids.  We were called to admit the patient.  ALLERGIES:  No known drug allergies.  MEDICATIONS: 1. Motrin. 2. Benadryl. 3. Multivitamin.  PAST MEDICAL HISTORY:  Diabetes.  PAST SURGICAL HISTORY:  None.  SOCIAL HISTORY:  Lives with husband.  Denies any tobacco, alcohol or illicit drug use.  FAMILY HISTORY:  Mother and father both had heart disease.  REVIEW OF SYSTEMS:  Endorses decreased appetite, fatigue, change in weight, nausea, vomiting, one loose stool, weakness, dry skin and hair. Denies fever, chills,  headache, chest pain, cough, dyspnea, dysuria, or abdominal pain.  PHYSICAL EXAMINATION:  VITAL SIGNS:  Temperature 97.1, pulse 103, respirations 32, blood pressure 76/42, pulse ox 96% on 2 liters. GENERAL:  Awake, no acute distress. HEENT:  EOMI.  PERRLA.  Oropharynx is dry with poor dentition.  No exudates or erythema. NECK:  Supple, nontender.  No lymphadenopathy. CARDIOVASCULAR:  Regular rate and rhythm.  No murmurs. LUNGS:  Clear to auscultation bilaterally.  No wheezes, rales, or rhonchi. ABDOMEN:  Soft, nontender, nondistended.  Positive bowel sounds. EXTREMITIES:  No CCE. NEUROLOGIC:  Alert, awake and oriented x3, but cognitively slow.  No focal neuro deficits. MSK:  Dorsalis pedis pulses are strong and equal bilaterally.  LABS AND STUDIES:  White count 17, hemoglobin 14.9, hematocrit 40.7, platelets 76.  Sodium 126, potassium 3.8, chloride 92, bicarb 14, BUN 53, creatinine 2.27, platelets 322, lactic acid 2.6.  Chest x-ray showed central airway thickening, no acute disease.  Point-of-care enzymes were negative.  I-Stat; pH 7.3, pCO2 33.3, PO2 25, bicarb 16.4.  Urinalysis: Amber glucose 500, moderate bilirubin, 15 ketones, large blood, moderate leukocyte.  U-micro; 21-50  white blood cells, 21-50 red blood cells, many bacteria.  UDS negative.  ASSESSMENT AND PLAN:  This is a 60 year old female who was not seen a doctor in over 5 years, who presents with nausea, vomiting and hyperglycemia. 1. Hyperglycemia.  Anion gap is 17, likely diabetic ketoacidosis.  UA     showed 15 ketones.  She is a known diabetic, but she does not take     any medications nor check her blood sugars.  We will start a DKA     protocol.  Once her gap closes, we will change to Lantus and     sliding scale insulin.  We will need to get diabetic education     involved and social work to establish care once discharged from the     hospital.  We will get an A1c and a fasting lipid panel. 2. Urinary tract  infection.  The patient received ceftriaxone IV one     dose in the ED.  We will continue ceftriaxone daily until the     patient can tolerate oral meds.  We will consider changing to     Keflex at that time.  We are awaiting a repeat UA with a Gram stain     and urine cultures.  We will give Tylenol p.r.n. for any fever or     pain.  There is concern for sepsis, the patient is hypotensive with     an elevated white count and tachycardic.  We will consider empiric     antibiotics, vancomycin and Zosyn, if the patient's blood pressure     do not improve with hydration.  We will also consider calling CCM     for consult if this is truly urosepsis. 3. Weight loss likely secondary to her uncontrolled diabetes.  We may     need to supplement with Ensure or order a Nutrition consult.  We     will also check her TSH. 4. Acute kidney injury likely secondary to dehydration.  We will     continue IV fluids at 200 mL an hour and then may consider     discontinuing that in the morning. 5. Altered mental status and confusion likely secondary to dehydration     versus urinary tract infection.  The patient is confused and    sluggish at baseline per family, and we will continue to monitor     her mental status. 6. Prophylaxis.  Lovenox 40 subcu. 7. Fluids, electrolytes, nutrition/gastrointestinal.  N.p.o. 8. Disposition.  Pending clinical improvement.    ______________________________ [name], MD   ______________________________ [name], M.D.    ID/MEDQ  D:  03/10/2011  T:  03/11/2011  Job:  [id number]  Electronically Signed by [name] MD on 03/18/2011 08:37:31 PM Electronically Signed by Zachery Dauer M.D. on 04/05/2011 04:10:27 PM

## 2011-04-17 ENCOUNTER — Ambulatory Visit (INDEPENDENT_AMBULATORY_CARE_PROVIDER_SITE_OTHER): Payer: Self-pay | Admitting: Family Medicine

## 2011-04-17 ENCOUNTER — Encounter: Payer: Self-pay | Admitting: Family Medicine

## 2011-04-17 DIAGNOSIS — R5383 Other fatigue: Secondary | ICD-10-CM | POA: Insufficient documentation

## 2011-04-17 DIAGNOSIS — N12 Tubulo-interstitial nephritis, not specified as acute or chronic: Secondary | ICD-10-CM | POA: Insufficient documentation

## 2011-04-17 DIAGNOSIS — E119 Type 2 diabetes mellitus without complications: Secondary | ICD-10-CM

## 2011-04-17 DIAGNOSIS — K5900 Constipation, unspecified: Secondary | ICD-10-CM

## 2011-04-17 DIAGNOSIS — I959 Hypotension, unspecified: Secondary | ICD-10-CM

## 2011-04-17 LAB — CBC
HCT: 40.4 % (ref 36.0–46.0)
Hemoglobin: 13.3 g/dL (ref 12.0–15.0)
MCH: 30.6 pg (ref 26.0–34.0)
MCHC: 32.9 g/dL (ref 30.0–36.0)
MCV: 93.1 fL (ref 78.0–100.0)
Platelets: 326 K/uL (ref 150–400)
RBC: 4.34 MIL/uL (ref 3.87–5.11)
RDW: 14.4 % (ref 11.5–15.5)
WBC: 6.3 K/uL (ref 4.0–10.5)

## 2011-04-17 LAB — BASIC METABOLIC PANEL WITH GFR
BUN: 15 mg/dL (ref 6–23)
CO2: 28 meq/L (ref 19–32)
Calcium: 9.7 mg/dL (ref 8.4–10.5)
Chloride: 104 meq/L (ref 96–112)
Creat: 0.85 mg/dL (ref 0.50–1.10)
Glucose, Bld: 109 mg/dL — ABNORMAL HIGH (ref 70–99)
Potassium: 4.2 meq/L (ref 3.5–5.3)
Sodium: 140 meq/L (ref 135–145)

## 2011-04-17 LAB — TSH: TSH: 1.198 u[IU]/mL (ref 0.350–4.500)

## 2011-04-17 NOTE — Progress Notes (Signed)
  Subjective:    Patient ID: [name] [name], female    DOB: 01-15-1951, 60 y.o.   MRN: [id number]  HPI Hospital follow-up following 04/25-05/04 admission.  1. T2DM, presenting with DKA in hospital Checks sugars at home in the morning. Ranging from 7-151. Feels lightheaded when sugars are on the lower side.  Taking Lantus 20 units in the morning. Takes Novolog 1 unit for sugars >150. Has only had to take Novolog 3 times since hospital D/C on 05/04.    2. R emphysematous pyelonephritis s/p R nephrectomy  Saw urologist recently. Told that she was doing well. ROS: no difficulty with urination; no fevers; no hematuria.   3. Fatigue Expected to be more energetic following hospital discharge. Not worsening fatigue.  Able to stay active during the day. Gets tired near the end of the day.   4. Constipation Sometimes 1 week without bowel movement.  Takes Citrucel daily and Colace twice daily.  Takes oxycodone/percocet occasionally for her side pain following surgery/anxiety.  Review of Systems    Objective:   Physical Exam General: NAD, pale? HEENT: dry MM CV: RRR Pulm: CTAB Abd: soft, non-tender, NABS, right-sided incision healing well, clean, dry; no CVA TTP Ext: no pedal edema, warm Skin: 4-5 sec capillary refill    Assessment & Plan:

## 2011-04-17 NOTE — Patient Instructions (Addendum)
For your constipation, try increasing the Citrucel to twice or three times daily. You can also try Miralax 1-2 capfuls once or twice daily as needed. Please also try to drink more water or other liquids. Let's aim for 6 cups daily. Try eating foods with fiber and try prune juice as well.   For your diabetes, cut back on your Lantus to 18 units every morning. Continue to measure your sugars at least once a day. Please write these down and bring them to me at your next visit. Do not take the extra insulin any more. If you sugars are consistently over 150 (for more than 3 days in a row), please call the clinic and let me know.   For your low blood pressure, I think it's due to dehydration. Please make sure you stay well hydrated, especially when it's warm outside and you are active.

## 2011-04-18 DIAGNOSIS — I959 Hypotension, unspecified: Secondary | ICD-10-CM | POA: Insufficient documentation

## 2011-04-18 DIAGNOSIS — K5900 Constipation, unspecified: Secondary | ICD-10-CM | POA: Insufficient documentation

## 2011-04-18 DIAGNOSIS — E119 Type 2 diabetes mellitus without complications: Secondary | ICD-10-CM | POA: Insufficient documentation

## 2011-04-18 NOTE — Assessment & Plan Note (Signed)
Likely 2/2 deconditioning. Patient not anemic today. Anemia has improved since D/C from hospital early May 2012.

## 2011-04-18 NOTE — Assessment & Plan Note (Signed)
Persistent on occasional narcotics. Recommended increasing Citrucel to bid-tid. Also increase fiber in diet and PO intake of liquids. Goal 6 cups daily. Patient appears dry on PE today.

## 2011-04-18 NOTE — Assessment & Plan Note (Addendum)
Doing well. Will f/u Urology June 12, 2011. Cr doing fine today.

## 2011-04-18 NOTE — Assessment & Plan Note (Addendum)
Occasional symptomatic hypoglycemia on Lantus 20. Will decrease to Lantus 18. Discontinuing Novolog. Asked patient to keep checking sugars daily for records and to call if consistently over 150. Will need to start ACEi at some point for renal protection but will hold off now due to low blood pressure today.

## 2011-04-18 NOTE — Assessment & Plan Note (Signed)
BP low today in the 90s/60s. Likely 2/2 dehydration. Patient appears dry on PE with dry MM and increased cap refill. Does not complain of symptoms for low BP, however. Encouraged increasing PO intake of liquids to about 6 cups daily.

## 2011-07-14 ENCOUNTER — Encounter: Payer: Self-pay | Admitting: Family Medicine

## 2011-07-14 ENCOUNTER — Ambulatory Visit (INDEPENDENT_AMBULATORY_CARE_PROVIDER_SITE_OTHER): Payer: Self-pay | Admitting: Family Medicine

## 2011-07-14 VITALS — BP 84/60 | HR 76 | Temp 97.9°F | Ht 64.0 in | Wt 132.5 lb

## 2011-07-14 DIAGNOSIS — E119 Type 2 diabetes mellitus without complications: Secondary | ICD-10-CM

## 2011-07-14 DIAGNOSIS — M62830 Muscle spasm of back: Secondary | ICD-10-CM | POA: Insufficient documentation

## 2011-07-14 DIAGNOSIS — N12 Tubulo-interstitial nephritis, not specified as acute or chronic: Secondary | ICD-10-CM

## 2011-07-14 DIAGNOSIS — M549 Dorsalgia, unspecified: Secondary | ICD-10-CM

## 2011-07-14 LAB — POCT URINALYSIS DIPSTICK
Bilirubin, UA: NEGATIVE
Blood, UA: NEGATIVE
Glucose, UA: NEGATIVE
Nitrite, UA: NEGATIVE
Spec Grav, UA: 1.025
Urobilinogen, UA: 0.2

## 2011-07-14 LAB — POCT GLYCOSYLATED HEMOGLOBIN (HGB A1C): Hemoglobin A1C: 7.1

## 2011-07-14 MED ORDER — CYCLOBENZAPRINE HCL 10 MG PO TABS: 10.0000 mg | ORAL_TABLET | Freq: Three times a day (TID) | ORAL | Status: DC | PRN

## 2011-07-14 NOTE — Patient Instructions (Signed)
Try the muscle relaxant to see if it helps your symptoms. You may continue to take the Motrin every 12 hours but make sure you stay well-hydrated (8-10 glasses of fluids daily).  Follow-up with me next Friday and we can re-assess this.

## 2011-07-15 ENCOUNTER — Encounter: Payer: Self-pay | Admitting: Family Medicine

## 2011-07-15 LAB — BASIC METABOLIC PANEL
CO2: 27 mEq/L (ref 19–32)
Chloride: 103 mEq/L (ref 96–112)
Potassium: 4.1 mEq/L (ref 3.5–5.3)
Sodium: 141 mEq/L (ref 135–145)

## 2011-07-15 LAB — SEDIMENTATION RATE: Sed Rate: 5 mm/hr (ref 0–22)

## 2011-07-15 LAB — CBC
HCT: 40.9 % (ref 36.0–46.0)
Hemoglobin: 13.4 g/dL (ref 12.0–15.0)
MCH: 29.6 pg (ref 26.0–34.0)
MCHC: 32.8 g/dL (ref 30.0–36.0)
RBC: 4.53 MIL/uL (ref 3.87–5.11)

## 2011-07-15 NOTE — Progress Notes (Signed)
  Subjective:    Patient ID: [name] [name], female    DOB: 09-28-1951, 60 y.o.   MRN: [id number]  HPI Reports doing poorly overall since I last saw her.  Complaining of significant pain down most of her spine and along her flank and abdomen.  These symptoms started about a year ago. Thought that the pain was due to her emphysematous pyelonephritis (hospitalized and s/p nephrectomy 03/2011) but the back is back.   ES Tylenol doesn't help pain but Motrin bid prn does help.  Warm compresses do not help. Not sure what aggravates pain, which is constant.  ROS: no loss of bladder/bowel, no numbness. Some bilateral arm tingling occasionally. Denies constipation, nausea/vomiting. Denies recent weight loss or decreased appetite.   Recent stressors: problems with her disability. Very frustrated because the process is taking so long and is repeatedly being asked to fill out new paperwork.   Review of Systems     Objective:   Physical Exam  Constitutional: She appears distressed.       Tearful, very anxious-appearing. Uncomfortable, lying on exam table when I entered room.   HENT:  Head: Normocephalic and atraumatic.  Mouth/Throat: Oropharynx is clear and moist.  Neck: Normal range of motion. Neck supple. No thyromegaly present.  Cardiovascular: Normal rate, regular rhythm, normal heart sounds and intact distal pulses.  Exam reveals no gallop.   No murmur heard. Pulmonary/Chest: Effort normal and breath sounds normal.  Abdominal: Soft. Bowel sounds are normal. She exhibits no distension and no mass. There is tenderness. There is guarding. There is no rebound.       Obese with loose skin Tenderness starting from bilateral flanks to lower abdomen. Symmetric distribution.   Musculoskeletal: Normal range of motion. She exhibits no edema.       Tender along bony prominences and immediately lateral bilaterally to her upper thoracic and lower lumbar spine.  No tenderness in her back muscles.     Lymphadenopathy:    She has no cervical adenopathy.  Neurological: She is alert. She has normal strength. She displays no atrophy and no tremor. No cranial nerve deficit or sensory deficit. She exhibits normal muscle tone. Coordination and gait normal.  Skin: Skin is warm and dry. No rash noted. She is not diaphoretic. No erythema. There is pallor.  Psychiatric: Her speech is normal. Judgment and thought content normal. Her mood appears anxious. Her affect is not angry, not blunt, not labile and not inappropriate. She is agitated. She is not aggressive, is not hyperactive, not slowed and not withdrawn. Cognition and memory are normal. She does not exhibit a depressed mood.          Assessment & Plan:

## 2011-07-15 NOTE — Assessment & Plan Note (Addendum)
Persistent problem for the past year but first time she is being seen by me for this. Differential is broad: may be musculoskeletal, electrolytes, neurologic, GI, urinary.  Right now, musculoskeletal seems most likely based on how tender she is to palpation, how diffuse the tenderness is down her spine and on her flank and abdomen, and absence of neurological signs (she is complaining of arm tingling, but her the tenderness does not seem to be in her cervical spine). Will try flexeril to see if this helps and continue low dose Motrin (being wary of her solitary kidney). Also encouraged stretching as tolerated. She was getting Vicodin but will not refill at this time, especially since she is not taking it and has not requested any.  Electrolytes are normal except for her urea today (Cr is okay) and CRP/ESR are also normal, making inflammatory process unlikely.  Urinalysis is negative for signs of infection.  Stress likely also contributing. Consider starting SSRI/SNRI. I considered getting images of her back, but I am not quite sure yet what I would be looking for based on such a broad distribution of her back pain. I am not sure what an x-ray would show, and I do not think getting an MRI is appropriate at this time.  Multiple myeloma is also possible with bony tenderness and fatigue, but less likely since she has no protein in her urine, normal calcium, and normal CBC.  Will follow-up with me in the next 1-2 weeks.   Addendum: based on rising Cr, avoid NSAIDs. Continue flexeril q8. May take Tylenol. Asked her to call by Friday if her pain is still severe without Motrin (currently pain is improved). May give Rx for Tramadol.

## 2011-07-15 NOTE — Assessment & Plan Note (Addendum)
We did not discuss this issue today but I wanted to include an addendum based on her labs. Her Cr is creeping up. Was 0.5 in early May. Now 0.95. Called patient and asked her to stop taking Motrin and increase fluid intake from her current 5-6 cups to 8-10 cups.

## 2011-07-15 NOTE — Assessment & Plan Note (Signed)
Fair control based on A1c. Will discuss more in detail in future; didn't have time to address this issue in depth due to other issues. Patient's sugars seem to be usually in the 150s (based on her sugar log). Asked her to bring log at next visit.

## 2011-07-28 ENCOUNTER — Ambulatory Visit (INDEPENDENT_AMBULATORY_CARE_PROVIDER_SITE_OTHER): Payer: Self-pay | Admitting: Family Medicine

## 2011-07-28 ENCOUNTER — Encounter: Payer: Self-pay | Admitting: Family Medicine

## 2011-07-28 VITALS — BP 62/48 | HR 100 | Temp 97.9°F | Ht 64.0 in | Wt 133.8 lb

## 2011-07-28 DIAGNOSIS — I959 Hypotension, unspecified: Secondary | ICD-10-CM

## 2011-07-28 DIAGNOSIS — N179 Acute kidney failure, unspecified: Secondary | ICD-10-CM | POA: Insufficient documentation

## 2011-07-28 DIAGNOSIS — M62830 Muscle spasm of back: Secondary | ICD-10-CM

## 2011-07-28 DIAGNOSIS — M549 Dorsalgia, unspecified: Secondary | ICD-10-CM

## 2011-07-28 DIAGNOSIS — M5380 Other specified dorsopathies, site unspecified: Secondary | ICD-10-CM

## 2011-07-28 MED ORDER — FLUDROCORTISONE ACETATE 0.1 MG PO TABS: 0.1000 mg | ORAL_TABLET | Freq: Every day | ORAL | Status: DC

## 2011-07-28 MED ORDER — CYCLOBENZAPRINE HCL 10 MG PO TABS: 10.0000 mg | ORAL_TABLET | Freq: Three times a day (TID) | ORAL | Status: DC | PRN

## 2011-07-28 NOTE — Assessment & Plan Note (Signed)
Pain significantly improved with Flexeril. Continue this and cold packs. Also recommended daily stretching.

## 2011-07-28 NOTE — Assessment & Plan Note (Signed)
Positive orthostatic today. I think she is still dehydrated and has an element of autonomic dysfunction as well (2/2 diabetes?). Continue to encourage her to drink more fluids. Will also start Florinef due to her being symptomatic. Will also check cortisol to rule-out Addison's disease.  I am also concerned her Cr has been rising, and I am concerned this is due to her hypoperfusing her kidneys. Although Flexeril may cause hypotension, will continue this for now since she reports significant improvement with this medication, and she was hypotensive with similar sitting blood pressures before starting this.  Follow-up in 2 weeks.

## 2011-07-28 NOTE — Progress Notes (Signed)
  Subjective:    Patient ID: [name] [name], female    DOB: 01-Jul-1951, 60 y.o.   MRN: [id number]  HPI 1. Back pain improved with Flexeril and cold packs  2. Dizziness and low blood pressure.  Persistently low blood pressure Complaining of dizziness recently Drinking 4-5 glasses liquids daily. Having difficult time drinking more. Feels full.  ROS: denies recent falls, difficultly urinating, heart palpatations/chest pain  Review of Systems Per HPI    Objective:   Physical Exam  Constitutional: She is oriented to person, place, and time.       Tired-appearing  HENT:       Dry mucous membranes  Cardiovascular: Normal rate, regular rhythm, normal heart sounds and intact distal pulses.  Exam reveals no gallop.   No murmur heard. Pulmonary/Chest: Effort normal and breath sounds normal. She has no rales.  Musculoskeletal: She exhibits no edema.  Neurological: She is alert and oriented to person, place, and time.  Skin: Skin is warm and dry. There is pallor.       3-4 sec capillary refill  Psychiatric:       Appears anxious-appearing (baseline for patient)  Back: mild TTP diffusely across lumbar back     Assessment & Plan:

## 2011-07-28 NOTE — Patient Instructions (Addendum)
Try the medication (Florinef) for your blood pressure and to help with your dizziness.   If your lab results are normal, I will send you a letter with the results. If abnormal, someone at the clinic will get in touch with you.   Follow-up with me in 2 weeks.

## 2011-07-28 NOTE — Assessment & Plan Note (Signed)
Cr has been trending upward since her hospital discharge in May 2012. 0.51 (May 2012), 0.85 (June 2012), 0.95 (August 2012). BUN/Cr ratio based on labs a few weeks ago in 20:1 ratio suggest dehydration. I think this and possibly decreased perfusion of her kidneys due to her low blood pressures may be contributing. Starting Florinef for her blood pressures. Encouraged increased hydration. She had no protein in a recent urinalysis, so I do not think her diabetes is contributing to this issue, especially with her HgbA1c almost being at goal. Will re-check BMET today.

## 2011-07-29 LAB — BASIC METABOLIC PANEL
BUN: 24 mg/dL — ABNORMAL HIGH (ref 6–23)
Chloride: 101 mEq/L (ref 96–112)
Potassium: 4.1 mEq/L (ref 3.5–5.3)
Sodium: 137 mEq/L (ref 135–145)

## 2011-07-29 LAB — CORTISOL: Cortisol, Plasma: 8.7 ug/dL

## 2011-07-30 ENCOUNTER — Encounter: Payer: Self-pay | Admitting: Family Medicine

## 2011-08-12 ENCOUNTER — Ambulatory Visit: Payer: Self-pay | Admitting: Family Medicine

## 2011-08-28 ENCOUNTER — Ambulatory Visit (INDEPENDENT_AMBULATORY_CARE_PROVIDER_SITE_OTHER): Payer: Medicaid Other | Admitting: Family Medicine

## 2011-08-28 ENCOUNTER — Encounter: Payer: Self-pay | Admitting: Family Medicine

## 2011-08-28 VITALS — BP 118/77 | HR 92 | Temp 98.0°F | Wt 135.0 lb

## 2011-08-28 DIAGNOSIS — N179 Acute kidney failure, unspecified: Secondary | ICD-10-CM

## 2011-08-28 DIAGNOSIS — F329 Major depressive disorder, single episode, unspecified: Secondary | ICD-10-CM

## 2011-08-28 DIAGNOSIS — F32A Depression, unspecified: Secondary | ICD-10-CM

## 2011-08-28 DIAGNOSIS — I959 Hypotension, unspecified: Secondary | ICD-10-CM

## 2011-08-28 DIAGNOSIS — Z23 Encounter for immunization: Secondary | ICD-10-CM

## 2011-08-28 DIAGNOSIS — M62830 Muscle spasm of back: Secondary | ICD-10-CM

## 2011-08-28 DIAGNOSIS — M5380 Other specified dorsopathies, site unspecified: Secondary | ICD-10-CM

## 2011-08-28 MED ORDER — FLUOXETINE HCL 20 MG PO CAPS: 20.0000 mg | ORAL_CAPSULE | Freq: Every day | ORAL | Status: DC

## 2011-08-28 NOTE — Assessment & Plan Note (Signed)
Persistent. Continue Flexeril and cold packs. Strongly encouraged her to do exercises daily. May refer her to PT in the future if stretching on her own isn't enough.

## 2011-08-28 NOTE — Assessment & Plan Note (Signed)
Blood pressure significantly improved today. Not quite sure why. May be due to Florinef, although she has not taken for a few days now. May be due to better hydration, although she still struggles with drinking adequately throughout the day. Cortisol level was negative when checked previously and Cr finally stabilized after creeping up for the past several weeks.  Told her she may continue to take Florinef if it helps her lightheadedness.  Will re-assess in 2 weeks when she is here for depression follow-up.

## 2011-08-28 NOTE — Assessment & Plan Note (Signed)
Blood pressure improved. Will not check Cr today. Cr had stabilized at last visit around 0.95.

## 2011-08-28 NOTE — Progress Notes (Signed)
  Subjective:    Patient ID: [name] [name], female    DOB: [birth year], 60 y.o.   MRN: [id number]  HPI This is a 60 YO F with a history of T2DM insulin-dependent in non-obese individual, chronically low blood pressure with positive orthostatics, depression, s/p nephrectomy from emphysematous pyelonephritis in 2012. Here for follow-up of:  1. Orthostatic hypotension Last took Florinef two days ago. Needs to get it refilled. Thinks may have helped lightheaded spells. Still gets them and lasts a few seconds with position changes but not as often. Denies falls. ROS: denies chest pain, headache, changes in vision/hearing, nausea/vomiting  2. Right back spasm Flexeril helps. Flexeril makes her sleepy 4-5 hours after taking.  Worse with certain positions. Does not do back exercises.   ROS: denies fevers, dysuria.  3. Depression Feels depressed. No SI/HI. But taking more naps during the day and more tired overall. Saw psychiatrist in the past when she was in college. But he made her do strange things (like talking to her hands). Did not help that much in the past.  Going through a lot right now fighting with insurance companies who are not agreeing to pay her hospital bills.  Review of Systems Per HPI.     Objective:   Physical Exam Gen: NAD Psych: appears anxious and depressed, sometimes looks like she is going to cry, but appropriate to questions, engaged, normal thought content and judgement MSK:   Back: no spasms with ROM with pain on right flank area worse with flexion on that side and also elicited by extension CV: RRR, no m/r/g Pulm: CTAB Ext: warm, dry, 2+ pulses pedal and radial    Assessment & Plan:

## 2011-08-28 NOTE — Patient Instructions (Signed)
Start the Prozac for your mood.  Follow-up with me in 2 weeks.  It may make you feel more anxious, nauseated, and cause headaches at first. These symptoms should improve. Decrease your dose by half if these symptoms are bothersome.   Continue the flexeril for your back spasms. Try those back exercises daily to help strengthen your muscles.  You may continue the Florinef if it is helping your dizziness. Try to stay hydrated. You are doing a good job trying to do this.

## 2011-08-28 NOTE — Assessment & Plan Note (Signed)
Long-standing problem I think but something we first addressed today. Going through a lot right now with her arguing with insurance companies trying to get hospital bills paid. Will start Prozac today to see if it will help her fatigue (taking several naps during the day, has low energy), decreased appetite/difficulty staying hydrated, and mood.  Discussed possibly talking to psychologist. Patient open to idea despite negative experience when she had psychiatrist during college. But not sure if she can afford at this time. Husband would like her to see psychologist. Will call her insurance to see if they will cover to see Dr. Pascal Lux. Will follow-up in 2 weeks now that she is starting Prozac.

## 2011-09-14 ENCOUNTER — Encounter: Payer: Self-pay | Admitting: Family Medicine

## 2011-09-14 ENCOUNTER — Ambulatory Visit (INDEPENDENT_AMBULATORY_CARE_PROVIDER_SITE_OTHER): Payer: Medicaid Other | Admitting: Family Medicine

## 2011-09-14 VITALS — BP 97/63 | HR 86 | Temp 98.0°F | Ht 64.0 in | Wt 135.0 lb

## 2011-09-14 DIAGNOSIS — F329 Major depressive disorder, single episode, unspecified: Secondary | ICD-10-CM

## 2011-09-14 DIAGNOSIS — I959 Hypotension, unspecified: Secondary | ICD-10-CM

## 2011-09-14 DIAGNOSIS — N179 Acute kidney failure, unspecified: Secondary | ICD-10-CM

## 2011-09-14 LAB — BASIC METABOLIC PANEL
CO2: 28 mEq/L (ref 19–32)
Calcium: 9.7 mg/dL (ref 8.4–10.5)
Chloride: 103 mEq/L (ref 96–112)
Glucose, Bld: 112 mg/dL — ABNORMAL HIGH (ref 70–99)
Sodium: 139 mEq/L (ref 135–145)

## 2011-09-14 MED ORDER — FLUOXETINE HCL 20 MG PO CAPS: 40.0000 mg | ORAL_CAPSULE | Freq: Every day | ORAL | Status: DC

## 2011-09-14 NOTE — Progress Notes (Signed)
  Subjective:    Patient ID: [name] [name], female    DOB: [birth year], 60 y.o.   MRN: [id number]  HPI 1. Low blood pressure  Occasionally gets lightheaded. Taking Florinef tablet helps. Takes about every other day.  Denies falling.  2. Depression Not sure if the medication is making a difference. Still feels tired during the day. Husband feels patient is able to concentrate better (able to read books for hours at a time), but sometimes her gait seems shorter and she seems more moody. ROS: denies HI/SI, hopelessness.  Review of Systems Per HPI.    Objective:   Physical Exam Gen: NAD, accompanied by husband Psych: appears less depressed than usual, still appears a little anxious, seems more engaged and has better eye contact; does not appear tearful CV: RRR Pulm: CTAB Ext: warm, dry, 2+ pedal pulses Neuro: no focal deficits    Assessment & Plan:

## 2011-09-14 NOTE — Assessment & Plan Note (Signed)
Seems to be improved today than it was at her last office visit on 10/12 before starting Prozac. Husband reports though that patient still appears fatigued and may be more moody. I think it is worth going up on this medication to see if it will help her fatigue since her demeanor seems brighter than it usually is. We will increase the Prozac from 20 mg to 40 mg daily and follow-up in 1 month. Husband and patient is amenable to this plan. He is amenable to seeing if a higher dose will help her despite his concerns about her mood, which do not seem to bother him significantly.  Follow-up in 1 month.

## 2011-09-14 NOTE — Assessment & Plan Note (Signed)
Systolics in the 90s is good for this patient. Her extremities are warm and pink. Does get dizzy still occasionally but since Florinef seems to help these spells, encouraged her to take daily regardless of her symptoms to see if it will prevent the spells.

## 2011-09-14 NOTE — Patient Instructions (Signed)
Take 1 tablet of the Florinef daily to prevent you from getting the dizzy spells.   Increase your Prozac to 2 tablets a day.   Follow-up on your blood pressure and your mood with me in 1 month please.

## 2011-10-01 ENCOUNTER — Other Ambulatory Visit: Payer: Self-pay | Admitting: Family Medicine

## 2011-10-01 NOTE — Telephone Encounter (Signed)
Refill request

## 2011-10-05 ENCOUNTER — Other Ambulatory Visit: Payer: Self-pay | Admitting: Family Medicine

## 2011-10-05 NOTE — Telephone Encounter (Signed)
Refill request

## 2011-10-12 ENCOUNTER — Ambulatory Visit (INDEPENDENT_AMBULATORY_CARE_PROVIDER_SITE_OTHER): Payer: Medicaid Other | Admitting: Family Medicine

## 2011-10-12 ENCOUNTER — Encounter: Payer: Self-pay | Admitting: Family Medicine

## 2011-10-12 DIAGNOSIS — E119 Type 2 diabetes mellitus without complications: Secondary | ICD-10-CM

## 2011-10-12 DIAGNOSIS — M62830 Muscle spasm of back: Secondary | ICD-10-CM

## 2011-10-12 DIAGNOSIS — F329 Major depressive disorder, single episode, unspecified: Secondary | ICD-10-CM

## 2011-10-12 DIAGNOSIS — M5380 Other specified dorsopathies, site unspecified: Secondary | ICD-10-CM

## 2011-10-12 MED ORDER — FLUOXETINE HCL 20 MG PO CAPS: 60.0000 mg | ORAL_CAPSULE | Freq: Every day | ORAL | Status: DC

## 2011-10-12 MED ORDER — CYCLOBENZAPRINE HCL 10 MG PO TABS: 10.0000 mg | ORAL_TABLET | Freq: Three times a day (TID) | ORAL | Status: DC | PRN

## 2011-10-12 MED ORDER — FLUDROCORTISONE ACETATE 0.1 MG PO TABS: 0.1000 mg | ORAL_TABLET | Freq: Every day | ORAL | Status: DC

## 2011-10-12 NOTE — Assessment & Plan Note (Signed)
Persistent but improved with Flexeril. Encouraged stretching exercises. Patient struggling to pay for hospital bills now and doesn't feel she can afford PT at this time. Will try stretches on her own. Concerned flexeril contributing to fatigue but since it really helps her pain, will continue for now.

## 2011-10-12 NOTE — Assessment & Plan Note (Signed)
Persistent per patient but improved per her husband who always accompanies her to her visits. Husband feels her attentiveness is improved. Still tired though during day and takes 2-3 hour naps.  Will increase Prozac to 60 daily.  Fatigue may be due to flexeril also. Patient is willing to deal with fatigue for now since Flexeril helps.

## 2011-10-12 NOTE — Progress Notes (Signed)
  Subjective:    Patient ID: [name] [name], female    DOB: [birth year], 60 y.o.   MRN: [id number]  HPI This is a 60 YO F with a history of emphysematous pyelonephritis s/p R nephrectomy, T2DM, depression, low blood pressure, chronic side pain attributed to muscle spasms. Here for follow-up.  1. Depression Patient doesn't notice a difference with the Prozac. Still feels depressed. Decreased energy. Feels like she has had depression most of her life. But husband says that she is more attentive, less forgetful, better to be around, more reasonable.   ROS: denies crying spells, feeling of hopelessness  2. Fatigue Still feeling sleepy through day and takes naps May be associated with flexeril, not as tired when she doesn't take it but then right-sided pain comes back  3. Dizziness Occasionally happens Florinef helps  4. T2DM Has been having lower sugars in 70s Sometimes forget to take her insulin  Review of Systems Per HPI    Objective:   Physical Exam Gen: NAD Psych: slightly anxious appearing; more engaged and has better eye contact since starting Prozac HEENT: MMM CV: RRR, no m/r/g Pulm: CTAB Neuro: normal gait, no dizziness with position changes Skin: warm, pink Back: significant TTP of right flank but without spasms with ROM; has good flexion, about 60 degrees, 30 degrees extension     Assessment & Plan:

## 2011-10-12 NOTE — Patient Instructions (Signed)
For your side pain:  -Continue taking the flexeril as needed  -Try those stretches 2-3 times a day  For your dizziness:  -Take the Florinef every day if you can  Follow-up in 1 week to talk about your diabetes

## 2011-10-12 NOTE — Assessment & Plan Note (Signed)
Will check HgbA1c at next visit in 1 week. Patient concerned about weight gain since starting insulin.

## 2011-10-22 ENCOUNTER — Ambulatory Visit: Payer: Medicaid Other | Admitting: Family Medicine

## 2012-01-14 ENCOUNTER — Other Ambulatory Visit: Payer: Self-pay | Admitting: Family Medicine

## 2012-01-14 NOTE — Telephone Encounter (Signed)
Refill request

## 2012-01-15 NOTE — Telephone Encounter (Signed)
Please encourage patient to schedule appointment within next month. Follow-up diabetes and back pain.

## 2012-02-05 ENCOUNTER — Other Ambulatory Visit: Payer: Self-pay | Admitting: Family Medicine

## 2012-03-03 ENCOUNTER — Other Ambulatory Visit: Payer: Self-pay | Admitting: Family Medicine

## 2013-02-13 ENCOUNTER — Inpatient Hospital Stay (HOSPITAL_COMMUNITY)
Admission: EM | Admit: 2013-02-13 | Discharge: 2013-03-08 | DRG: 674 | Disposition: A | Discharge status: Home Health | Payer: Medicaid Other | Attending: Family Medicine | Admitting: Family Medicine

## 2013-02-13 ENCOUNTER — Encounter (HOSPITAL_COMMUNITY): Payer: Self-pay | Admitting: Emergency Medicine

## 2013-02-13 ENCOUNTER — Emergency Department (HOSPITAL_COMMUNITY): Payer: Medicaid Other

## 2013-02-13 DIAGNOSIS — R21 Rash and other nonspecific skin eruption: Secondary | ICD-10-CM | POA: Diagnosis present

## 2013-02-13 DIAGNOSIS — M5380 Other specified dorsopathies, site unspecified: Secondary | ICD-10-CM | POA: Diagnosis present

## 2013-02-13 DIAGNOSIS — Z886 Allergy status to analgesic agent status: Secondary | ICD-10-CM | POA: Diagnosis not present

## 2013-02-13 DIAGNOSIS — I059 Rheumatic mitral valve disease, unspecified: Secondary | ICD-10-CM | POA: Diagnosis present

## 2013-02-13 DIAGNOSIS — N189 Chronic kidney disease, unspecified: Secondary | ICD-10-CM | POA: Diagnosis present

## 2013-02-13 DIAGNOSIS — E861 Hypovolemia: Secondary | ICD-10-CM | POA: Diagnosis present

## 2013-02-13 DIAGNOSIS — H9190 Unspecified hearing loss, unspecified ear: Secondary | ICD-10-CM | POA: Diagnosis present

## 2013-02-13 DIAGNOSIS — M62838 Other muscle spasm: Secondary | ICD-10-CM | POA: Insufficient documentation

## 2013-02-13 DIAGNOSIS — I129 Hypertensive chronic kidney disease with stage 1 through stage 4 chronic kidney disease, or unspecified chronic kidney disease: Secondary | ICD-10-CM | POA: Diagnosis present

## 2013-02-13 DIAGNOSIS — E119 Type 2 diabetes mellitus without complications: Secondary | ICD-10-CM | POA: Diagnosis present

## 2013-02-13 DIAGNOSIS — J90 Pleural effusion, not elsewhere classified: Secondary | ICD-10-CM | POA: Diagnosis present

## 2013-02-13 DIAGNOSIS — I079 Rheumatic tricuspid valve disease, unspecified: Secondary | ICD-10-CM | POA: Diagnosis present

## 2013-02-13 DIAGNOSIS — Z794 Long term (current) use of insulin: Secondary | ICD-10-CM | POA: Diagnosis not present

## 2013-02-13 DIAGNOSIS — E872 Acidosis, unspecified: Secondary | ICD-10-CM | POA: Diagnosis present

## 2013-02-13 DIAGNOSIS — R1013 Epigastric pain: Secondary | ICD-10-CM | POA: Diagnosis present

## 2013-02-13 DIAGNOSIS — E876 Hypokalemia: Secondary | ICD-10-CM | POA: Diagnosis not present

## 2013-02-13 DIAGNOSIS — R1115 Cyclical vomiting syndrome unrelated to migraine: Secondary | ICD-10-CM | POA: Diagnosis present

## 2013-02-13 DIAGNOSIS — K3184 Gastroparesis: Secondary | ICD-10-CM | POA: Diagnosis not present

## 2013-02-13 DIAGNOSIS — E860 Dehydration: Secondary | ICD-10-CM

## 2013-02-13 DIAGNOSIS — E669 Obesity, unspecified: Secondary | ICD-10-CM | POA: Diagnosis present

## 2013-02-13 DIAGNOSIS — Z6832 Body mass index (BMI) 32.0-32.9, adult: Secondary | ICD-10-CM | POA: Diagnosis not present

## 2013-02-13 DIAGNOSIS — D649 Anemia, unspecified: Secondary | ICD-10-CM | POA: Diagnosis present

## 2013-02-13 DIAGNOSIS — R1110 Vomiting, unspecified: Secondary | ICD-10-CM

## 2013-02-13 DIAGNOSIS — E871 Hypo-osmolality and hyponatremia: Secondary | ICD-10-CM | POA: Diagnosis present

## 2013-02-13 DIAGNOSIS — Q602 Renal agenesis, unspecified: Secondary | ICD-10-CM | POA: Diagnosis not present

## 2013-02-13 DIAGNOSIS — Z905 Acquired absence of kidney: Secondary | ICD-10-CM | POA: Diagnosis not present

## 2013-02-13 DIAGNOSIS — E875 Hyperkalemia: Secondary | ICD-10-CM

## 2013-02-13 DIAGNOSIS — E8779 Other fluid overload: Secondary | ICD-10-CM

## 2013-02-13 DIAGNOSIS — R197 Diarrhea, unspecified: Secondary | ICD-10-CM | POA: Diagnosis present

## 2013-02-13 DIAGNOSIS — K5900 Constipation, unspecified: Secondary | ICD-10-CM | POA: Diagnosis not present

## 2013-02-13 DIAGNOSIS — F329 Major depressive disorder, single episode, unspecified: Secondary | ICD-10-CM

## 2013-02-13 DIAGNOSIS — R7989 Other specified abnormal findings of blood chemistry: Secondary | ICD-10-CM | POA: Diagnosis present

## 2013-02-13 DIAGNOSIS — Q605 Renal hypoplasia, unspecified: Secondary | ICD-10-CM

## 2013-02-13 DIAGNOSIS — A498 Other bacterial infections of unspecified site: Secondary | ICD-10-CM | POA: Diagnosis present

## 2013-02-13 DIAGNOSIS — M719 Bursopathy, unspecified: Secondary | ICD-10-CM | POA: Insufficient documentation

## 2013-02-13 DIAGNOSIS — I959 Hypotension, unspecified: Secondary | ICD-10-CM | POA: Diagnosis not present

## 2013-02-13 DIAGNOSIS — N12 Tubulo-interstitial nephritis, not specified as acute or chronic: Secondary | ICD-10-CM

## 2013-02-13 DIAGNOSIS — L299 Pruritus, unspecified: Secondary | ICD-10-CM

## 2013-02-13 DIAGNOSIS — K5289 Other specified noninfective gastroenteritis and colitis: Secondary | ICD-10-CM | POA: Diagnosis present

## 2013-02-13 DIAGNOSIS — K3189 Other diseases of stomach and duodenum: Secondary | ICD-10-CM | POA: Diagnosis present

## 2013-02-13 DIAGNOSIS — N170 Acute kidney failure with tubular necrosis: Principal | ICD-10-CM | POA: Diagnosis present

## 2013-02-13 DIAGNOSIS — R42 Dizziness and giddiness: Secondary | ICD-10-CM | POA: Diagnosis not present

## 2013-02-13 DIAGNOSIS — A048 Other specified bacterial intestinal infections: Secondary | ICD-10-CM | POA: Diagnosis present

## 2013-02-13 DIAGNOSIS — I4891 Unspecified atrial fibrillation: Secondary | ICD-10-CM | POA: Diagnosis present

## 2013-02-13 DIAGNOSIS — K5730 Diverticulosis of large intestine without perforation or abscess without bleeding: Secondary | ICD-10-CM | POA: Diagnosis present

## 2013-02-13 DIAGNOSIS — R112 Nausea with vomiting, unspecified: Secondary | ICD-10-CM

## 2013-02-13 DIAGNOSIS — R5383 Other fatigue: Secondary | ICD-10-CM

## 2013-02-13 DIAGNOSIS — F4321 Adjustment disorder with depressed mood: Secondary | ICD-10-CM | POA: Diagnosis present

## 2013-02-13 DIAGNOSIS — F32A Depression, unspecified: Secondary | ICD-10-CM

## 2013-02-13 DIAGNOSIS — N390 Urinary tract infection, site not specified: Secondary | ICD-10-CM | POA: Diagnosis present

## 2013-02-13 DIAGNOSIS — N179 Acute kidney failure, unspecified: Secondary | ICD-10-CM

## 2013-02-13 DIAGNOSIS — M62830 Muscle spasm of back: Secondary | ICD-10-CM

## 2013-02-13 LAB — URINALYSIS, ROUTINE W REFLEX MICROSCOPIC
Glucose, UA: NEGATIVE mg/dL
Ketones, ur: NEGATIVE mg/dL
pH: 5.5 (ref 5.0–8.0)

## 2013-02-13 LAB — POCT I-STAT 3, VENOUS BLOOD GAS (G3P V)
O2 Saturation: 82 %
O2 Saturation: 99 %
TCO2: 6 mmol/L (ref 0–100)
pCO2, Ven: 14.5 mmHg — ABNORMAL LOW (ref 45.0–50.0)
pCO2, Ven: 20.5 mmHg — ABNORMAL LOW (ref 45.0–50.0)
pH, Ven: 7.142 — CL (ref 7.250–7.300)
pO2, Ven: 151 mmHg — ABNORMAL HIGH (ref 30.0–45.0)
pO2, Ven: 58 mmHg — ABNORMAL HIGH (ref 30.0–45.0)

## 2013-02-13 LAB — HEPATIC FUNCTION PANEL
ALT: <5 U/L (ref 0–35)
Bilirubin, Direct: <0.1 mg/dL (ref 0.0–0.3)
Total Protein: 7.3 g/dL (ref 6.0–8.3)

## 2013-02-13 LAB — CBC
HCT: 27.3 % — ABNORMAL LOW (ref 36.0–46.0)
Hemoglobin: 8.8 g/dL — ABNORMAL LOW (ref 12.0–15.0)
MCV: 89.8 fL (ref 78.0–100.0)
Platelets: 366 10*3/uL (ref 150–400)
RBC: 3.04 MIL/uL — ABNORMAL LOW (ref 3.87–5.11)
WBC: 12.6 10*3/uL — ABNORMAL HIGH (ref 4.0–10.5)

## 2013-02-13 LAB — BASIC METABOLIC PANEL
BUN: 103 mg/dL — ABNORMAL HIGH (ref 6–23)
CO2: <7 mEq/L — CL (ref 19–32)
Chloride: 102 mEq/L (ref 96–112)
Creatinine, Ser: 8.72 mg/dL — ABNORMAL HIGH (ref 0.50–1.10)

## 2013-02-13 LAB — URINE MICROSCOPIC-ADD ON

## 2013-02-13 LAB — PROTIME-INR
INR: 1.21 (ref 0.00–1.49)
Prothrombin Time: 15.1 seconds (ref 11.6–15.2)

## 2013-02-13 MED ORDER — SODIUM BICARBONATE 8.4 % IV SOLN
INTRAVENOUS | Status: DC
  Administered 2013-02-14 (×2): via INTRAVENOUS
  Filled 2013-02-13 (×7): qty 1000

## 2013-02-13 MED ORDER — SODIUM CHLORIDE 0.9 % IV BOLUS (SEPSIS)
1000.0000 mL | Freq: Once | INTRAVENOUS | Status: AC
  Administered 2013-02-13: 1000 mL via INTRAVENOUS

## 2013-02-13 NOTE — ED Notes (Signed)
MD at bedside. 

## 2013-02-13 NOTE — ED Notes (Signed)
Attempted to call report. Floor RN unable to accept report.  

## 2013-02-13 NOTE — ED Provider Notes (Signed)
History     CSN: [id number]  Arrival date & time 02/13/13  1929   First MD Initiated Contact with Patient 02/13/13 2145      Chief Complaint  Patient presents with  . Emesis  . Hypotension    (Consider location/radiation/quality/duration/timing/severity/associated sxs/prior treatment) HPI Comments: Pt presenting from urgent care secondary to hypotension.  On arrival here, she was not hypotensive, but she was found to be in acute renal failure based on labs drawn in triage.   Patient is a 62 y.o. female presenting with vomiting.  Emesis Severity:  Severe Duration:  2 weeks Timing:  Intermittent Number of daily episodes:  Several Quality:  Stomach contents Progression:  Unchanged Recent urination:  Decreased Relieved by:  Nothing Associated symptoms: abdominal pain   Associated symptoms: no diarrhea and no fever   Associated symptoms comment:  Shortness of breath Risk factors: diabetes     Past Medical History  Diagnosis Date  . Hearing difficulty     Mild   . Diabetes mellitus without complication   . Night muscle spasms   . Bursitis   . Fatigue     Past Surgical History  Procedure Laterality Date  . Right nephrectomy  03/2011    For emphysematous pyelonephritis    Family History  Problem Relation Age of Onset  . Heart disease    . Diabetes    . Stroke    . Bone cancer Father     Of jaws   . Throat cancer Father     History  Substance Use Topics  . Smoking status: Never Smoker   . Smokeless tobacco: Not on file  . Alcohol Use: No    OB History   Grav Para Term Preterm Abortions TAB SAB Ect Mult Living                  Review of Systems  Constitutional: Negative for fever.  HENT: Negative for congestion.   Respiratory: Negative for cough and shortness of breath.   Cardiovascular: Negative for chest pain.  Gastrointestinal: Positive for vomiting and abdominal pain. Negative for nausea and diarrhea.  All other systems reviewed and are  negative.    Allergies  Aleve and Motrin  Home Medications   Current Outpatient Rx  Name  Route  Sig  Dispense  Refill  . cyclobenzaprine (FLEXERIL) 10 MG tablet      TAKE 1 TABLET BY MOUTH THREE TIMES DAILY AS NEEDED FOR MUSCLE SPASMS   30 tablet   0   . docusate sodium (COLACE) 100 MG capsule   Oral   Take 100 mg by mouth daily.           . fludrocortisone (FLORINEF) 0.1 MG tablet   Oral   Take 1 tablet (0.1 mg total) by mouth daily.   30 tablet   3   . EXPIRED: FLUoxetine (PROZAC) 20 MG capsule   Oral   Take 3 capsules (60 mg total) by mouth daily.   90 capsule   1   . insulin glargine (LANTUS) 100 UNIT/ML injection   Subcutaneous   Inject 18 Units into the skin every morning.   10 mL   6   . methylcellulose packet   Oral   Take by mouth daily as needed.             BP 130/45  Pulse 66  Temp(Src) 97.7 F (36.5 C) (Oral)  Resp 18  SpO2 100%  Physical Exam  Nursing note and  vitals reviewed. Constitutional: She is oriented to person, place, and time. She appears well-developed and well-nourished. No distress.  HENT:  Head: Normocephalic and atraumatic.  Mouth/Throat: Oropharynx is clear and moist.  Eyes: Conjunctivae are normal. Pupils are equal, round, and reactive to light. No scleral icterus.  Neck: Neck supple.  Cardiovascular: Normal rate, regular rhythm, normal heart sounds and intact distal pulses.   No murmur heard. Pulmonary/Chest: Effort normal and breath sounds normal. No stridor. No respiratory distress. She has no rales.  Abdominal: Soft. Bowel sounds are normal. She exhibits no distension. There is tenderness in the epigastric area. There is no rigidity, no rebound and no guarding.  Musculoskeletal: Normal range of motion.  Neurological: She is alert and oriented to person, place, and time.  Skin: Skin is warm and dry. No rash noted.  Psychiatric: She has a normal mood and affect. Her behavior is normal.    ED Course  Procedures  (including critical care time)  Labs Reviewed  CBC - Abnormal; Notable for the following:    WBC 12.6 (*)    RBC 3.04 (*)    Hemoglobin 8.8 (*)    HCT 27.3 (*)    All other components within normal limits  BASIC METABOLIC PANEL - Abnormal; Notable for the following:    Sodium 130 (*)    Potassium 5.9 (*)    CO2 <7 (*)    BUN 103 (*)    Creatinine, Ser 8.72 (*)    Calcium 8.3 (*)    GFR calc non Af Amer 4 (*)    GFR calc Af Amer 5 (*)    All other components within normal limits  URINALYSIS, ROUTINE W REFLEX MICROSCOPIC   Dg Chest Portable 1 View  02/13/2013  *RADIOLOGY REPORT*  Clinical Data: Nausea and vomiting.  Hypotension.  PORTABLE CHEST - 1 VIEW  Comparison: 03/15/2011  Findings: Shallow inspiration.  Borderline heart size and pulmonary vascularity are probably normal for technique.  No focal consolidation or airspace disease in the lungs.  No blunting of costophrenic angles.  No pneumothorax.  Mediastinal contours appear intact.  IMPRESSION: Shallow inspiration.  No evidence of active pulmonary disease.   Original Report Authenticated By: Burman Nieves, M.D.   All radiology studies independently viewed by me.      Date: 02/13/2013  Rate: 68  Rhythm: normal sinus rhythm  QRS Axis: normal  Intervals: normal  ST/T Wave abnormalities: normal  Conduction Disutrbances:none  Narrative Interpretation:   Old EKG Reviewed: unchanged   1. Acute renal failure   2. Anemia   3. Dehydration   4. Vomiting       MDM   Acute renal failure of unknown etiology.  She has been vomiting for several weeks and has not been able to drink much fluid.  Labs show significant renal dysfunction.  Discussed with Dr. Hyman Hopes (Nephrology) who recommended IV hydration with D5W + bicarb.  She has no respiratory compromise and her EKG is narrow complex without peaked t waves.  He did not think she needed to be dialyzed tonight.  Labs also show anemia, of which she reports a long history.  Her  stool is heme-negative.  Consulted Family practice for admission to stepdown unit.          Rennis Petty, MD 02/14/13 [phone]

## 2013-02-13 NOTE — ED Notes (Signed)
Critical Lab value received from lab, CO >7, Pt name and DOB verified, Charge nurse notified

## 2013-02-13 NOTE — ED Notes (Addendum)
Patient complaining of abdominal cramps, nausea and vomiting for the past couple of weeks.  Emesis intermittent.  Patient was seen at [facility]; was told to come to the ED due to dehydration and low blood pressure.  Patient has one kidney; denies any urinary changes.  Denies chest pain and shortness of breath.

## 2013-02-13 NOTE — H&P (Signed)
Family Medicine [facility] Admission History and Physical Service Pager: [phone]  Patient name: [name] [name] Medical record number: [id number] Date of birth: [birth year] Age: 62 y.o. Gender: female  Primary Care Provider: [facility], Marylene Land, MD  Chief Complaint: Nausea/Vomiting  Assessment and Plan: [name] [name] is a 62 y.o. year old female presenting with PMH of DM-2, muscle spasms, and emphysematous pylenephritis s/p right nephrectomy who presents with nausea/vomiting for approximately 2 weeks.  In the ED, patient found to have acute kidney injury with associated metabolic acidosis and hyperkalemia.  # AKI  - Will admit to Step down, Attending Dr. Gwendolyn Grant - BUN/Creatinine elevated at 103/8.72, unclear how longstanding this is given patient has not had labs or follow up in nearly 18 months.  - Renal consulted in the ED and recommended IV fluids - D5 NS with 150 mEq of Bicarb - Will hydrated overnight, and Renal will evaluate in the am. - Follow up BMP at 3 am and 5 am.  # Hyperkalemia - Potassium 5.9 on admission with mild peaking of T waves. - Kayexalate 30 g x 1.  Will follow up BMP to reassess.  # Metabolic acidosis - secondary to acute renal failure - IV fluids with Bicarb as above per Renal recommendations.  # UTI - UA revealed large leukocytes, and TNTC WBC's. - Urine Cx obtained. - Given symptoms and AKI, will treat with empiric CTX while awaiting Culture results.  # Nausea/vomiting/diarrhea - Zofran PRN nausea - Clear liquid diet - IV fluids as above.  # DM-2 - Last A1C was 7.1 in 2012 - Obtaining A1C - CBG's TID AC and QHS - Sensitive SSI  # Anemia - Obtaining anemia panel for further evaluation - Will monitor closely during admission  # Muscle spasms of back - Flexeril TID PRN, Tylenol PRN  FEN/GI: D5 NS with 150 mEq NaHCO3; Clear liquid diet Prophylaxis: Heparin SQ Disposition: Pending clinical improvement Code Status: Full code.    History of Present Illness:  [name] [name] is a 62 y.o. year old female with PMH of DM-2, muscle spasms, and emphysematous pylenephritis s/p right nephrectomy who presents with nausea/vomiting for approximately 2 weeks.  Patient reports nausea/vomiting x 1-2 weeks.  She has been unable to keep liquids/solids down.  Given decreased intake, patient has also noted a decline in her urine output.  Today, she went to urgent care to be evaluation.  There she was found to have hypotension and was sent to the ED for further evaluation.  In the ED, BMP revealed hyponatremia (130), hyperkalemia (5.9), Uremia with BUN of 103, Creatinine of 8.72.  Patient also had metabolic acidosis with bicarb of <7.  Urinalysis revealed large leukocytes, and TNTC WBC's.   ROS: Reports diarrhea and some abdominal discomfort. Denies fevers, chest pain.  Reports SOB. Reports dysuria.  Patient Active Problem List  Diagnosis  . Emphysematous pyelonephritis  . Fatigue  . Diabetes mellitus type 2 in nonobese  . Constipation  . Low blood pressure  . Muscle spasm of back  . Acute kidney injury  . Depression  . Night muscle spasms  . Bursitis   Past Medical History: Past Medical History  Diagnosis Date  . Hearing difficulty     Mild   . Diabetes mellitus without complication   . Night muscle spasms   . Bursitis   . Fatigue    Past Surgical History: Past Surgical History  Procedure Laterality Date  . Right nephrectomy  03/2011    For  emphysematous pyelonephritis   Social History: History  Substance Use Topics  . Smoking status: Never Smoker   . Smokeless tobacco: Not on file  . Alcohol Use: No   Family History: Family History  Problem Relation Age of Onset  . Heart disease    . Diabetes    . Stroke    . Bone cancer Father     Of jaws   . Throat cancer Father    Allergies: Allergies  Allergen Reactions  . Aleve (Naproxen Sodium)     Due to kidneys.  . Motrin (Ibuprofen)     Due to  kidneys.   No current facility-administered medications on file prior to encounter.   Current Outpatient Prescriptions on File Prior to Encounter  Medication Sig Dispense Refill  . cyclobenzaprine (FLEXERIL) 10 MG tablet TAKE 1 TABLET BY MOUTH THREE TIMES DAILY AS NEEDED FOR MUSCLE SPASMS  30 tablet  0  . docusate sodium (COLACE) 100 MG capsule Take 100 mg by mouth daily.        . fludrocortisone (FLORINEF) 0.1 MG tablet Take 1 tablet (0.1 mg total) by mouth daily.  30 tablet  3  . FLUoxetine (PROZAC) 20 MG capsule Take 3 capsules (60 mg total) by mouth daily.  90 capsule  1  . insulin glargine (LANTUS) 100 UNIT/ML injection Inject 18 Units into the skin every morning.  10 mL  6  . methylcellulose packet Take by mouth daily as needed.         Review Of Systems: Per HPI. Otherwise 12 point review of systems was performed and was unremarkable.  Physical Exam: BP 131/51  Pulse 67  Temp(Src) 97.7 F (36.5 C) (Oral)  Resp 13  SpO2 98% Exam: General: elderly lady resting comfortably in bed, appears in mild distress secondary to reported muscle spasms. HEENT: NCAT. Mucous membranes appear slightly dry. No pharyngeal erythema appreciated. Cardiovascular: RRR. No murmurs, rubs, or gallops. Respiratory: CTAB. No rales, rhonchi, or wheezing appreciated. Abdomen: obese, soft, diffusely tender to palpation. No rebound, organomegaly, or guarding  Extremities: Trace lower extremity edema. Warm well perfused. 2+ DP and PT pulses. Skin: No rash. Neuro: Hard of hearing. AO x 3. No focal deficits.  Labs and Imaging: CBC BMET   Recent Labs Lab 02/13/13 1942  WBC 12.6*  HGB 8.8*  HCT 27.3*  PLT 366    Recent Labs Lab 02/13/13 1942  NA 130*  K 5.9*  CL 102  CO2 <7*  BUN 103*  CREATININE 8.72*  GLUCOSE 88  CALCIUM 8.3*     Urinalysis    Component Value Date/Time   COLORURINE YELLOW 02/13/2013 2315   APPEARANCEUR TURBID* 02/13/2013 2315   LABSPEC 1.015 02/13/2013 2315   PHURINE  5.5 02/13/2013 2315   GLUCOSEU NEGATIVE 02/13/2013 2315   HGBUR LARGE* 02/13/2013 2315   BILIRUBINUR NEGATIVE 02/13/2013 2315   BILIRUBINUR NEG 07/14/2011 1701   KETONESUR NEGATIVE 02/13/2013 2315   PROTEINUR 30* 02/13/2013 2315   UROBILINOGEN 0.2 02/13/2013 2315   UROBILINOGEN 0.2 07/14/2011 1701   NITRITE NEGATIVE 02/13/2013 2315   NITRITE NEG 07/14/2011 1701   LEUKOCYTESUR LARGE* 02/13/2013 2315    Dg Chest Portable 1 View 02/13/2013  *RADIOLOGY REPORT*  Clinical Data: Nausea and vomiting.  Hypotension.  PORTABLE CHEST - 1 VIEW  Comparison: 03/15/2011  Findings: Shallow inspiration.  Borderline heart size and pulmonary vascularity are probably normal for technique.  No focal consolidation or airspace disease in the lungs.  No blunting of costophrenic angles.  No  pneumothorax.  Mediastinal contours appear intact.  IMPRESSION: Shallow inspiration.  No evidence of active pulmonary disease.  Adriana Simas Deadwood, DO 02/13/2013, 11:15 PM  PGY3 Addendum to PGY1 H & P: I agree with the above note with highlighted additions.   Ula Couvillon 02/14/2013

## 2013-02-14 ENCOUNTER — Inpatient Hospital Stay (HOSPITAL_COMMUNITY): Payer: Medicaid Other

## 2013-02-14 DIAGNOSIS — E872 Acidosis: Secondary | ICD-10-CM

## 2013-02-14 DIAGNOSIS — E119 Type 2 diabetes mellitus without complications: Secondary | ICD-10-CM

## 2013-02-14 DIAGNOSIS — D649 Anemia, unspecified: Secondary | ICD-10-CM

## 2013-02-14 DIAGNOSIS — M5380 Other specified dorsopathies, site unspecified: Secondary | ICD-10-CM

## 2013-02-14 DIAGNOSIS — N179 Acute kidney failure, unspecified: Secondary | ICD-10-CM

## 2013-02-14 DIAGNOSIS — E875 Hyperkalemia: Secondary | ICD-10-CM

## 2013-02-14 DIAGNOSIS — R112 Nausea with vomiting, unspecified: Secondary | ICD-10-CM

## 2013-02-14 LAB — CBC
Hemoglobin: 8 g/dL — ABNORMAL LOW (ref 12.0–15.0)
MCH: 28.8 pg (ref 26.0–34.0)
MCV: 88.8 fL (ref 78.0–100.0)
RBC: 2.78 MIL/uL — ABNORMAL LOW (ref 3.87–5.11)

## 2013-02-14 LAB — PREALBUMIN: Prealbumin: 12.4 mg/dL — ABNORMAL LOW (ref 17.0–34.0)

## 2013-02-14 LAB — BASIC METABOLIC PANEL
BUN: 101 mg/dL — ABNORMAL HIGH (ref 6–23)
Creatinine, Ser: 8.42 mg/dL — ABNORMAL HIGH (ref 0.50–1.10)
GFR calc Af Amer: 5 mL/min — ABNORMAL LOW (ref >90)
GFR calc non Af Amer: 4 mL/min — ABNORMAL LOW (ref >90)

## 2013-02-14 LAB — FERRITIN: Ferritin: 495 ng/mL — ABNORMAL HIGH (ref 10–291)

## 2013-02-14 LAB — OCCULT BLOOD, POC DEVICE: Fecal Occult Bld: NEGATIVE

## 2013-02-14 LAB — IRON AND TIBC: TIBC: 137 ug/dL — ABNORMAL LOW (ref 250–470)

## 2013-02-14 LAB — CHLORIDE, URINE, RANDOM: Chloride Urine: 44 mEq/L

## 2013-02-14 LAB — RAPID URINE DRUG SCREEN, HOSP PERFORMED
Cocaine: NOT DETECTED
Opiates: NOT DETECTED
Tetrahydrocannabinol: NOT DETECTED

## 2013-02-14 LAB — RETICULOCYTES
Retic Count, Absolute: 38.9 10*3/uL (ref 19.0–186.0)
Retic Ct Pct: 1.4 % (ref 0.4–3.1)

## 2013-02-14 LAB — GLUCOSE, CAPILLARY
Glucose-Capillary: 149 mg/dL — ABNORMAL HIGH (ref 70–99)
Glucose-Capillary: 79 mg/dL (ref 70–99)

## 2013-02-14 LAB — NA AND K (SODIUM & POTASSIUM), RAND UR: Sodium, Ur: 66 mEq/L

## 2013-02-14 LAB — HEMOGLOBIN A1C: Hgb A1c MFr Bld: 6.1 % — ABNORMAL HIGH (ref <5.7)

## 2013-02-14 MED ORDER — ONDANSETRON HCL 4 MG/2ML IJ SOLN
4.0000 mg | Freq: Four times a day (QID) | INTRAMUSCULAR | Status: DC | PRN
  Administered 2013-02-14 – 2013-02-18 (×8): 4 mg via INTRAVENOUS
  Filled 2013-02-14 (×9): qty 2

## 2013-02-14 MED ORDER — ACETAMINOPHEN 325 MG PO TABS
650.0000 mg | ORAL_TABLET | Freq: Four times a day (QID) | ORAL | Status: DC | PRN
  Administered 2013-02-14 – 2013-02-27 (×9): 650 mg via ORAL
  Filled 2013-02-14 (×8): qty 2

## 2013-02-14 MED ORDER — HEPARIN SODIUM (PORCINE) 5000 UNIT/ML IJ SOLN
5000.0000 [IU] | Freq: Three times a day (TID) | INTRAMUSCULAR | Status: DC
  Administered 2013-02-14 – 2013-02-15 (×4): 5000 [IU] via SUBCUTANEOUS
  Filled 2013-02-14 (×7): qty 1

## 2013-02-14 MED ORDER — INSULIN ASPART 100 UNIT/ML ~~LOC~~ SOLN
0.0000 [IU] | Freq: Three times a day (TID) | SUBCUTANEOUS | Status: DC
  Administered 2013-02-14: 2 [IU] via SUBCUTANEOUS
  Administered 2013-02-15 (×3): 1 [IU] via SUBCUTANEOUS
  Administered 2013-02-16 (×2): 2 [IU] via SUBCUTANEOUS
  Administered 2013-02-17 – 2013-02-19 (×3): 1 [IU] via SUBCUTANEOUS
  Administered 2013-02-20 – 2013-02-21 (×2): 2 [IU] via SUBCUTANEOUS
  Administered 2013-02-21 (×2): 1 [IU] via SUBCUTANEOUS
  Administered 2013-02-22 – 2013-02-24 (×3): 2 [IU] via SUBCUTANEOUS
  Administered 2013-02-25 (×2): 1 [IU] via SUBCUTANEOUS
  Administered 2013-02-26: 2 [IU] via SUBCUTANEOUS
  Administered 2013-02-26: 3 [IU] via SUBCUTANEOUS
  Administered 2013-02-26 – 2013-02-27 (×2): 2 [IU] via SUBCUTANEOUS
  Administered 2013-02-27: 1 [IU] via SUBCUTANEOUS
  Administered 2013-02-28: 3 [IU] via SUBCUTANEOUS
  Administered 2013-02-28: 2 [IU] via SUBCUTANEOUS
  Administered 2013-03-01: 3 [IU] via SUBCUTANEOUS
  Administered 2013-03-02 (×2): 1 [IU] via SUBCUTANEOUS
  Administered 2013-03-02: 13:00:00 via SUBCUTANEOUS
  Administered 2013-03-03 – 2013-03-05 (×7): 1 [IU] via SUBCUTANEOUS
  Administered 2013-03-06 – 2013-03-08 (×4): 2 [IU] via SUBCUTANEOUS

## 2013-02-14 MED ORDER — CYCLOBENZAPRINE HCL 10 MG PO TABS: 5.0000 mg | ORAL_TABLET | Freq: Three times a day (TID) | ORAL | Status: DC | PRN

## 2013-02-14 MED ORDER — ASPIRIN EC 81 MG PO TBEC
81.0000 mg | DELAYED_RELEASE_TABLET | Freq: Every day | ORAL | Status: DC
  Administered 2013-02-14 – 2013-03-08 (×20): 81 mg via ORAL
  Filled 2013-02-14 (×23): qty 1

## 2013-02-14 MED ORDER — SODIUM CHLORIDE 0.9 % IJ SOLN
3.0000 mL | Freq: Two times a day (BID) | INTRAMUSCULAR | Status: DC
  Administered 2013-02-14: 10:00:00 via INTRAVENOUS
  Administered 2013-02-17 – 2013-03-08 (×34): 3 mL via INTRAVENOUS

## 2013-02-14 MED ORDER — ACETAMINOPHEN 650 MG RE SUPP: 650.0000 mg | Freq: Four times a day (QID) | RECTAL | Status: DC | PRN

## 2013-02-14 MED ORDER — DEXTROSE 5 % IV SOLN
1.0000 g | INTRAVENOUS | Status: DC
  Administered 2013-02-14 – 2013-02-23 (×11): 1 g via INTRAVENOUS
  Filled 2013-02-14 (×12): qty 10

## 2013-02-14 MED ORDER — SODIUM POLYSTYRENE SULFONATE 15 GM/60ML PO SUSP
30.0000 g | Freq: Once | ORAL | Status: AC
  Administered 2013-02-14: 30 g via ORAL
  Filled 2013-02-14: qty 120

## 2013-02-14 MED ORDER — SODIUM BICARBONATE 8.4 % IV SOLN
INTRAVENOUS | Status: DC
  Administered 2013-02-14 – 2013-02-15 (×4): via INTRAVENOUS
  Filled 2013-02-14 (×9): qty 150

## 2013-02-14 MED ORDER — PROMETHAZINE HCL 25 MG/ML IJ SOLN
25.0000 mg | Freq: Four times a day (QID) | INTRAMUSCULAR | Status: DC | PRN
  Administered 2013-02-15: 12.5 mg via INTRAVENOUS
  Filled 2013-02-14: qty 1

## 2013-02-14 MED ORDER — ONDANSETRON HCL 4 MG PO TABS
4.0000 mg | ORAL_TABLET | Freq: Four times a day (QID) | ORAL | Status: DC | PRN
  Administered 2013-02-19: 4 mg via ORAL
  Filled 2013-02-14: qty 1

## 2013-02-14 NOTE — ED Provider Notes (Signed)
Pt has had increased n/v over the last several weeks (vague with regards to timing), has had progressive weakness as well.  She has not had much PO intake.  Sx are severe on arrival and was sent from UC secondary to hypotension.    On exam she has a soft abdomen, appears dehydrated, has clear lungs and no murmurs, mild epigastric ttp.  No edema, no rashes and normal MS though she appears diffusely weak.  Lab w/u shows that the pt has a severe metabolic acidosis, hyperkalemia, hyponatremia and acute renal failure.  The ECG shows some flattening of the P waves but normal QRS complexes and normal sinus rhythm.  She required stat Nephrology consult by Dr. Hyman Hopes who agreed with my treatmnet plan of starting Sodium Bicarbonate and then a drip of the same and hydration to help with oligouria and renal failure.  The ABG shows acidosis.  Admitted to IM service  CRITICAL CARE Performed by: Vida Roller   Total critical care time: 35  Critical care time was exclusive of separately billable procedures and treating other patients.  Critical care was necessary to treat or prevent imminent or life-threatening deterioration.  Critical care was time spent personally by me on the following activities: development of treatment plan with patient and/or surrogate as well as nursing, discussions with consultants, evaluation of patient's response to treatment, examination of patient, obtaining history from patient or surrogate, ordering and performing treatments and interventions, ordering and review of laboratory studies, ordering and review of radiographic studies, pulse oximetry and re-evaluation of patient's condition.  I have seen and interpreted and agree with the residents interpretation of the ECG  Diagnosis:  1.  Metabolic Acidosis 2.  Acute Renal Failure 3.  Anemia 4.  Dehydration 5.  Nausea and Vomiting 6.  Hyponatremia   Vida Roller, MD 02/14/13 [phone]

## 2013-02-14 NOTE — Consult Note (Signed)
[facility] - CONSULT NOTE Resident Note    Please see below for attending addendum to resident note.   Date: 02/14/2013                  Patient Name:  [name] [name]  MRN: [id number]  DOB: 10/17/1951  Age / Sex: 62 y.o., female         PCP: Priscella Mann                 Referring Physician: Payton Mccallum, MD                 Reason for Consult: AKI, metabolic acidosis, hyperkalemia, dehydration, uremia            History of Present Illness: Patient is a 62 y.o. female with a PMHx of DM II, s/p right nephrectomy for emphysematous pyelonephritis in 2012, who was admitted to [facility] on 02/13/2013 for evaluation of intractable nausea and vomiting and diarrhea/watery stools with metabolic acidosis, now on IV replacement and bicarb drip. The patient states that her symptoms began roughly 2 weeks ago. She states that the nausea and vomiting were intermittent, some days she would vomit 3x and other days she wouldn't vomit at all. When vomiting was at its worst, she states that she could not get liquids down. The vomiting did not appear blood tinged or feculent. It appeared to be food she attempted to put down. She states that the symptoms worsened yesterday so she went to Urgent care. There they said her BP was low and advised her to come here to [facility]. She denies chest pain and trouble with urination.  Labs on Admission  K - 5.9 Cl - 102 Co2 - <7 BUN - 103 Cr - 8.72 Ca - 8.3 GFR - 4 Alk phos- 128 Alb- 3.0  This morning the patient had two episodes of emesis. She tolerated jello and liquids this am. Does not report any pain.     Medications: Outpatient medications: Prescriptions prior to admission  Medication Sig Dispense Refill  . cyclobenzaprine (FLEXERIL) 10 MG tablet TAKE 1 TABLET BY MOUTH THREE TIMES DAILY AS NEEDED FOR MUSCLE SPASMS  30 tablet  0  . docusate sodium (COLACE) 100 MG capsule Take 100 mg by mouth daily.        . insulin glargine (LANTUS) 100  UNIT/ML injection Inject 18 Units into the skin every morning.  10 mL  6    Current medications: Current Facility-Administered Medications  Medication Dose Route Frequency Provider Last Rate Last Dose  . acetaminophen (TYLENOL) tablet 650 mg  650 mg Oral Q6H PRN Tommie Sams, DO   650 mg at 02/14/13 1005   Or  . acetaminophen (TYLENOL) suppository 650 mg  650 mg Rectal Q6H PRN Tommie Sams, DO      . aspirin EC tablet 81 mg  81 mg Oral Daily Jayce G Cook, DO   81 mg at 02/14/13 1005  . cefTRIAXone (ROCEPHIN) 1 g in dextrose 5 % 50 mL IVPB  1 g Intravenous Q24H Tommie Sams, DO   1 g at 02/14/13 0223  . cyclobenzaprine (FLEXERIL) tablet 5 mg  5 mg Oral TID PRN Tommie Sams, DO      . dextrose 5 % 1,000 mL with sodium bicarbonate 150 mEq infusion   Intravenous Continuous Rennis Petty, MD 125 mL/hr at 02/14/13 0600    . heparin injection 5,000 Units  5,000 Units Subcutaneous Q8H Jayce G  Cook, DO   5,000 Units at 02/14/13 0636  . insulin aspart (novoLOG) injection 0-9 Units  0-9 Units Subcutaneous TID WC Jayce G Cook, DO      . ondansetron (ZOFRAN) tablet 4 mg  4 mg Oral Q6H PRN Tommie Sams, DO       Or  . ondansetron (ZOFRAN) injection 4 mg  4 mg Intravenous Q6H PRN Tommie Sams, DO   4 mg at 02/14/13 0640  . promethazine (PHENERGAN) injection 25 mg  25 mg Intravenous Q6H PRN Elenora Gamma, MD      . sodium chloride 0.9 % injection 3 mL  3 mL Intravenous Q12H Tommie Sams, DO          Allergies: Allergies  Allergen Reactions  . Aleve (Naproxen Sodium)     Due to kidneys.  . Motrin (Ibuprofen)     Due to kidneys.      Past Medical History: Past Medical History  Diagnosis Date  . Hearing difficulty     Mild   . Diabetes mellitus without complication   . Night muscle spasms   . Bursitis   . Fatigue      Past Surgical History: Past Surgical History  Procedure Laterality Date  . Right nephrectomy  03/2011    For emphysematous pyelonephritis     Family  History: Family History  Problem Relation Age of Onset  . Heart disease    . Diabetes    . Stroke    . Bone cancer Father     Of jaws   . Throat cancer Father   Father died  37 "cancer," mother died 42 "Alzheimer's." 2 sis one with CVA and DM, one with CABG. 2 bros--one with DM, one with CAD.  No children    Social History:  reports that she has never smoked. She does not have any smokeless tobacco history on file. She reports that she does not drink alcohol or use illicit drugs.Born in [place], [place].  Grad from [employer] with degree in Special Ed.  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  Married (40th anniversary later this year).     Review of Systems: As per HPI  Vital Signs: Blood pressure 135/45, pulse 68, temperature 98.6 F (37 C), temperature source Oral, resp. rate 22, height 5\' 5"  (1.651 m), weight 83.3 kg (183 lb 10.3 oz), SpO2 94.00%.  Weight trends: Filed Weights   02/14/13 0030  Weight: 83.3 kg (183 lb 10.3 oz)    Physical Exam: General: Vital signs reviewed and noted. Well-developed, well-nourished, in no acute distress; alert, appropriate and cooperative throughout examination.  Head: Normocephalic, atraumatic.  Eyes: PERRL, EOMI, No signs of anemia or jaundice. Significant sensorineural hearing loss L>R.  Nose: Mucous membranes on the dry side, not inflammed, nonerythematous.  Throat: Oropharynx nonerythematous, no exudate appreciated.   Neck: No deformities, masses, or tenderness noted.Supple, No carotid Bruits, no JVD.  Lungs:  Normal respiratory effort. Clear to auscultation BL without crackles or wheezes.  Heart: RRR. S1 and S2 normal without gallop, murmur, or rubs.  Abdomen:  Obese, BS normoactive. Soft, Nondistended, non-tender.  No masses or organomegaly. Incision R flank  Extremities: Trace pretibial edema BL .Tenderness to palpation BL over shins.  Neurologic: A&O X3, CN II - XII are grossly intact. Motor strength is 5/5 in  the all 4 extremities, Sensations intact to light touch, Cerebellar signs negative. R handed  Skin: No visible rashes, scars. Distal nail bed discoloration on all digits  BL.    Lab results: Basic Metabolic Panel:  Recent Labs Lab 02/13/13 1942 02/14/13 0449  NA 130* 134*  K 5.9* 5.2*  CL 102 105  CO2 <7* 8*  GLUCOSE 88 112*  BUN 103* 101*  CREATININE 8.72* 8.42*  CALCIUM 8.3* 8.0*    Liver Function Tests:  Recent Labs Lab 02/13/13 2153  AST 6  ALT <5  ALKPHOS 128*  BILITOT 0.2*  PROT 7.3  ALBUMIN 3.0*   No results found for this basename: LIPASE, AMYLASE,  in the last 168 hours No results found for this basename: AMMONIA,  in the last 168 hours  CBC:  Recent Labs Lab 02/13/13 1942 02/14/13 0449  WBC 12.6* 10.7*  HGB 8.8* 8.0*  HCT 27.3* 24.7*  MCV 89.8 88.8  PLT 366 341    Cardiac Enzymes: No results found for this basename: CKTOTAL, CKMB, CKMBINDEX, TROPONINI,  in the last 168 hours  BNP: No components found with this basename: POCBNP,   CBG:  Recent Labs Lab 02/14/13 0011 02/14/13 0807  GLUCAP 79 108*    Microbiology: Results for orders placed during the hospital encounter of 02/13/13  MRSA PCR SCREENING     Status: None   Collection Time    02/14/13  2:30 AM      Result Value Range Status   MRSA by PCR NEGATIVE  NEGATIVE Final   Comment:            The GeneXpert MRSA Assay (FDA     approved for NASAL specimens     only), is one component of a     comprehensive MRSA colonization     surveillance program. It is not     intended to diagnose MRSA     infection nor to guide or     monitor treatment for     MRSA infections.    Coagulation Studies:  Recent Labs  02/13/13 2323  LABPROT 15.1  INR 1.21    Urinalysis:  Recent Labs  02/13/13 2315  COLORURINE YELLOW  LABSPEC 1.015  PHURINE 5.5  GLUCOSEU NEGATIVE  HGBUR LARGE*  BILIRUBINUR NEGATIVE  KETONESUR NEGATIVE  PROTEINUR 30*  UROBILINOGEN 0.2  NITRITE NEGATIVE   LEUKOCYTESUR LARGE*   7-10 RBC, TNTC WBC, many bacteria  Imaging: Dg Chest Portable 1 View  02/13/2013  *RADIOLOGY REPORT*  Clinical Data: Nausea and vomiting.  Hypotension.  PORTABLE CHEST - 1 VIEW  Comparison: 03/15/2011  Findings: Shallow inspiration.  Borderline heart size and pulmonary vascularity are probably normal for technique.  No focal consolidation or airspace disease in the lungs.  No blunting of costophrenic angles.  No pneumothorax.  Mediastinal contours appear intact.  IMPRESSION: Shallow inspiration.  No evidence of active pulmonary disease.   Original Report Authenticated By: Burman Nieves, M.D.      Assessment & Plan: Pt is a 62 y.o. yo female with a PMHX of DM II, with prior history emphysematous pyelonephritis-- s/p right nephrectomy in 2012, was admitted to [facility] on 02/13/2013 with nausea, vomiting, diarrhea for 2 weeks resulting in AKI with associated metabolic acidosis and hyperkalemia.   1. Acute Kidney Injury, likely ATN 2/2 hypovolemia - BUN 101, Cr 8.42, Cr on admission 8.72 and prior to that, Cr from 03/20/11-09/14/11 trended from 0.51-0.93; no more recent data as pt has not been to PCP in about 43yr. Was given D5W with 150 mEq Bicarb in ED yesterday. Scheduled for Renal US today. BP is 135/45. Urine with  protein, some RBC's, LOTS of WBCs,  FEna = 9.3%. High FE Na probably reflect vomiting with High urine Na.   Pt does not exhibit signs of uremia and does not appear overloaded; however, with hyperkalemia and acidemia, may need to plan for dialysis, watch labs.  2. HTN/vol - continue IVF as bicarb is 8. No evidence of pulm dz on CXR.   3. Hyperkalemia - 5.9 on admission. Given 30 g Kayexalate at 1:05 am. Most recent 5.2. May need to give another dose.   4. Anemia - Sat 23%, Ferritin 495, Folate 8.4. Rx with IV Fe.  5. UTI - currently on rocephin  6.  N/V/D--this appears to be what caused her to become vol depleted resulting in lab abnormalities.  The question is why  did this develop?  Does she have UTI or gastroenteritis?  UA has WBC and bacteria.  Renal US pending.  She has had prior nephrectomy for pyelo and has little margin for error. It may be useful to have GU see to determine why she gets UTIs. Does she have diabetic gastroparesis?      @PROBHOSP @  DVT PPX - Heparin [address]   Patient history and plan of care reviewed with attending, Dr. Marina Gravel.   Winnifred Friar, Student-PA  02/14/2013, 10:39 AM I saw and examined [name] [name] and discussed her with Celesta Gentile, PA student.  See my comments above.  If she has UTI, she should then bee seen by GU since she's already had nephrectomy for bad pyelo.  IVF with bicarb is fine until acidosis corrects. I don't think uremia caused N/V/D--rather N/V/D resulted in prerenal azotemia.   I'm hopeful she'll get better with vol and bicarb +/- antibiotics.

## 2013-02-14 NOTE — H&P (Signed)
FMTS Attending Admission Note: Renold Don MD Personal pager:  [phone] FPTS Service Pager:  [phone]  I  have seen and examined this patient, reviewed their chart. I have discussed this patient with the resident. I agree with the resident's findings, assessment and care plan.  Additionally: 62 yo F s/p nephrectomy 2012 for emphysematous pyelonephritis admitted for intractable nausea and vomiting likely secondary to uremia with AKI, also with metabolic acidosis, now on IV replacement and bicarb drip:  1. Acute kidney injury: - unclear etiology.  Denies NSAID use - Hgb is 8.  Check FOB, but I suspect she has component of chronic renal disease leading to chronic anemia and this is acute on chronic renal injury.  A1C pending.  - History of N/V/D and decreased fluid intake with reported increased urinary output.  Question which came first -- signs of uremia leading to dehydration or dehydration leading to uremia.   - Obtaining FeNa to check.  Not on any diuretics at home - Will add urine potassium, chloride to evaluate for RTA.  Renal US - Agree with renal consult - Patient making urine.  May need short vs long-term renal replacement.    2.  Metabolic acidosis - Bicarb < 7 last night, 8 this AM. - Little improvement with drip - Need to find underlying cause.   - Would classify as mixed anion gap and non-AG metabolic acidosis based on delta:delta ratio, possibly AG acidosis secondary to dehydration, with non-AG acidosis possibly secondary to diarrhea, RTA, or both.  Unclear why vomiting has not caused metabolic alkalosis.  - Negative ketonuria, therefore check lactate and tox screen.    3.  Pyuria: - Also with increased urinary frequency.   - Possible that she has UTI which has sparked this as well.  Agree with CTX, await urine culture.   - Afebrile currently, has had some chills at home but no overt fevers.   - WBC 12 on admit, 10 this AM.    4.  Hyperkalemia: - as per resident note

## 2013-02-14 NOTE — Progress Notes (Signed)
I discussed the care plan with Dr Cook and the FPTS team and agree with assessment and plan as documented in the progress note above.  

## 2013-02-14 NOTE — Progress Notes (Signed)
Family Medicine Teaching Service Daily Progress Note Service Page: [phone]  Subjective:  Had 2 additional episodes of emesis this am.  She was able to tolerate some liquids and jello this am. Some abdominal discomfort reported, otherwise no pain.  Continues to report nausea this am.  Objective: Temp:  [97.6 F (36.4 C)-98.6 F (37 C)] 98.6 F (37 C) (04/01 0700) Pulse Rate:  [66-68] 68 (04/01 0349) Resp:  [13-22] 22 (04/01 0349) BP: (117-142)/(45-54) 135/45 mmHg (04/01 0349) SpO2:  [97 %-100 %] 98 % (04/01 0349) Weight:  [183 lb 10.3 oz (83.3 kg)] 183 lb 10.3 oz (83.3 kg) (04/01 0030)   Intake/Output Summary (Last 24 hours) at 02/14/13 0859 Last data filed at 02/14/13 0400  Gross per 24 hour  Intake      0 ml  Output    225 ml  Net   -225 ml   Exam: General: elderly lady resting comfortably in bed, NAD. Cardiovascular: RRR. No murmurs, rubs, or gallops.  Respiratory: CTAB. No rales, rhonchi, or wheezing appreciated.  Abdomen: obese, soft, slightly tender to palpation. Extremities: Trace lower extremity edema. Neuro: No focal deficits.   CBC BMET   Recent Labs Lab 02/13/13 1942 02/14/13 0449  WBC 12.6* 10.7*  HGB 8.8* 8.0*  HCT 27.3* 24.7*  PLT 366 341    Recent Labs Lab 02/13/13 1942 02/14/13 0449  NA 130* 134*  K 5.9* 5.2*  CL 102 105  CO2 <7* 8*  BUN 103* 101*  CREATININE 8.72* 8.42*  GLUCOSE 88 112*  CALCIUM 8.3* 8.0*     Imaging/Diagnostic Tests:  Dg Chest Portable 1 View 02/13/2013  *RADIOLOGY REPORT*  Clinical Data: Nausea and vomiting.  Hypotension.  PORTABLE CHEST - 1 VIEW  Comparison: 03/15/2011  Findings: Shallow inspiration.  Borderline heart size and pulmonary vascularity are probably normal for technique.  No focal consolidation or airspace disease in the lungs.  No blunting of costophrenic angles.  No pneumothorax.  Mediastinal contours appear intact.  IMPRESSION: Shallow inspiration.  No evidence of active pulmonary  disease.  Assessment/Plan: [name] [name] is a 62 y.o. year old female presenting with PMH of DM-2, muscle spasms, and emphysematous pylenephritis s/p right nephrectomy who presents with nausea/vomiting for approximately 2 weeks. In the ED, patient found to have acute kidney injury with associated metabolic acidosis and hyperkalemia.   # AKI  - BUN/Creatinine elevated at 103/8.72 - Renal consulted in the ED and recommended IV fluids - D5 NS with 150 mEq of Bicarb  - Will continue fluids and followup renal recommendations - Obtained Urine Sodium/Creatinine today to evaluate for pre-renal vs intrinsic renal pathology  # Hyperkalemia  - Potassium 5.9 on admission with mild peaking of T waves.  - Kayexalate 30 g x 1.  - Improved this am at 5.2. Holding of on additional treatment.  Will wait for renal recommendations.  # Metabolic acidosis - Anion gap 21.  Delta:Delta ratio - <1 indicating mix AG metabolic acidosis (likely from Uremia/renal failure) and non-anion gap metabolic acidosis (likely from diarrhea) - IV fluids with Bicarb as above per Renal recommendations.  - Lactic acid pending, Utox pending  # UTI  - UA revealed large leukocytes, and TNTC WBC's.  - Urine Cx obtained.  - Given symptoms and AKI, will treat with empiric CTX while awaiting Culture results.   # Nausea/vomiting/diarrhea  - Zofran PRN nausea  - Clear liquid diet  - IV fluids as above.   # DM-2 - Last A1C was 7.1 in 2012  -  Obtaining A1C  - CBG's TID AC and QHS  - Sensitive SSI   # Anemia  - Obtaining anemia panel for further evaluation  - FOBT pending - Will monitor closely during admission   # Muscle spasms of back  - Flexeril TID PRN, Tylenol PRN   FEN/GI: D5 NS with 150 mEq NaHCO3; Clear liquid diet  Prophylaxis: Heparin SQ  Disposition: Pending clinical improvement  Code Status: Full code.    Everlene Other, DO 02/14/2013, 8:47 AM

## 2013-02-14 NOTE — Progress Notes (Signed)
Utilization review completed.  

## 2013-02-14 NOTE — Evaluation (Signed)
Physical Therapy Evaluation Patient Details Name: [name] [name] MRN: [id number] DOB: October 04, 1951 Today's Date: 02/14/2013 Time: 0940-1005 PT Time Calculation (min): 25 min  PT Assessment / Plan / Recommendation Clinical Impression  Pt is a 62 yo female admitted with actute kidney injury. Pt presents with deconditioning and decreased balance. Pt was able to tolerate ambulation 130 feet with Supervision using a RW. Pt currently requires Supervision due to lines and equipment management. I feel pt would be Mod Independent household ambulation. Pt reports she lives with her husband, but he is unable to provide any physical support. I feel pt would be able to d/c home using the RW for mobility and follow-up HHPT services.     PT Assessment  Patient needs continued PT services    Follow Up Recommendations  Home health PT    Does the patient have the potential to tolerate intense rehabilitation      Barriers to Discharge Decreased caregiver support husband with decreased vision and unale to provide physical support.    Equipment Recommendations       Recommendations for Other Services     Frequency Min 3X/week    Precautions / Restrictions Restrictions Weight Bearing Restrictions: No   Pertinent Vitals/Pain       Mobility  Bed Mobility Bed Mobility: Supine to Sit Supine to Sit: 6: Modified independent (Device/Increase time) Details for Bed Mobility Assistance: decreased speed and increased effort Transfers Transfers: Sit to Stand;Stand to Sit Sit to Stand: 5: Supervision Details for Transfer Assistance: ue support and decreased speed Ambulation/Gait Ambulation/Gait Assistance: 5: Supervision Ambulation Distance (Feet): 135 Feet Assistive device: Rolling walker Ambulation/Gait Assistance Details: decreased balance without RW, cues for upright posture Gait Pattern: Step-through pattern;Decreased stride length;Trunk flexed Gait velocity: decreased Stairs: No    Exercises      PT Diagnosis: Difficulty walking;Generalized weakness  PT Problem List: Decreased strength;Decreased activity tolerance;Decreased balance;Decreased mobility PT Treatment Interventions: DME instruction;Gait training;Stair training;Functional mobility training;Therapeutic activities;Therapeutic exercise;Balance training   PT Goals Acute Rehab PT Goals PT Goal Formulation: With patient Time For Goal Achievement: 02/28/13 Potential to Achieve Goals: Good Pt will go Sit to Stand: with modified independence PT Goal: Sit to Stand - Progress: Goal set today Pt will go Stand to Sit: with modified independence PT Goal: Stand to Sit - Progress: Goal set today Pt will Transfer Bed to Chair/Chair to Bed: with modified independence PT Transfer Goal: Bed to Chair/Chair to Bed - Progress: Goal set today Pt will Ambulate: >150 feet;with modified independence;with least restrictive assistive device PT Goal: Ambulate - Progress: Goal set today  Visit Information  Last PT Received On: 02/14/13 Assistance Needed: +1    Subjective Data  Subjective: I am tired Patient Stated Goal: To go home and be independent.   Prior Functioning  Home Living Lives With: Spouse Available Help at Discharge: Family Type of Home: House Home Access: Level entry Home Layout: One level Home Adaptive Equipment: Straight cane;Walker - rolling Prior Function Level of Independence: Independent with assistive device(s) Able to Take Stairs?: Yes Driving: No Communication Communication: HOH    Cognition  Cognition Overall Cognitive Status: Appears within functional limits for tasks assessed/performed Arousal/Alertness: Awake/alert Orientation Level: Appears intact for tasks assessed Behavior During Session: [facility] for tasks performed    Extremity/Trunk Assessment Right Lower Extremity Assessment RLE ROM/Strength/Tone: Deficits RLE ROM/Strength/Tone Deficits: grossly 4/5 RLE Sensation: WFL - Light Touch Left Lower  Extremity Assessment LLE ROM/Strength/Tone: Deficits LLE ROM/Strength/Tone Deficits: grossly 4/5 LLE Sensation: WFL - Light Touch  LLE Coordination: WFL - gross/fine motor Trunk Assessment Trunk Assessment: Other exceptions Trunk Exceptions: decreased cervical ROM into extension, minimal Thoracic kyphosis   Balance Balance Balance Assessed: Yes Static Sitting Balance Static Sitting - Balance Support: No upper extremity supported Static Sitting - Level of Assistance: 7: Independent Static Standing Balance Static Standing - Balance Support: No upper extremity supported Static Standing - Level of Assistance: 5: Stand by assistance Dynamic Standing Balance Dynamic Standing - Balance Support: No upper extremity supported Dynamic Standing - Level of Assistance: 4: Min assist High Level Balance High Level Balance Activites: Side stepping  End of Session PT - End of Session Equipment Utilized During Treatment: Gait belt Activity Tolerance: Patient tolerated treatment well Patient left: in bed;with call bell/phone within reach Nurse Communication: Mobility status  GP     Greggory Stallion 02/14/2013, 10:22 AM

## 2013-02-15 ENCOUNTER — Inpatient Hospital Stay (HOSPITAL_COMMUNITY): Payer: Medicaid Other

## 2013-02-15 LAB — CBC
MCH: 29.8 pg (ref 26.0–34.0)
MCHC: 34.3 g/dL (ref 30.0–36.0)
MCV: 86.9 fL (ref 78.0–100.0)
Platelets: 281 10*3/uL (ref 150–400)
RDW: 15.3 % (ref 11.5–15.5)

## 2013-02-15 LAB — RENAL FUNCTION PANEL
Albumin: 2.3 g/dL — ABNORMAL LOW (ref 3.5–5.2)
Calcium: 6.1 mg/dL — CL (ref 8.4–10.5)
Chloride: 98 mEq/L (ref 96–112)
Creatinine, Ser: 7.54 mg/dL — ABNORMAL HIGH (ref 0.50–1.10)
GFR calc non Af Amer: 5 mL/min — ABNORMAL LOW (ref >90)
Phosphorus: 5.1 mg/dL — ABNORMAL HIGH (ref 2.3–4.6)

## 2013-02-15 LAB — GLUCOSE, CAPILLARY
Glucose-Capillary: 108 mg/dL — ABNORMAL HIGH (ref 70–99)
Glucose-Capillary: 142 mg/dL — ABNORMAL HIGH (ref 70–99)

## 2013-02-15 MED ORDER — GI COCKTAIL ~~LOC~~
30.0000 mL | Freq: Once | ORAL | Status: AC
  Administered 2013-02-15: 30 mL via ORAL
  Filled 2013-02-15: qty 30

## 2013-02-15 MED ORDER — DARBEPOETIN ALFA-POLYSORBATE 100 MCG/0.5ML IJ SOLN
100.0000 ug | INTRAMUSCULAR | Status: DC
  Administered 2013-02-15 – 2013-02-22 (×2): 100 ug via SUBCUTANEOUS
  Filled 2013-02-15 (×4): qty 0.5

## 2013-02-15 MED ORDER — CALCIUM CARBONATE 1250 (500 CA) MG PO TABS
1.0000 | ORAL_TABLET | Freq: Three times a day (TID) | ORAL | Status: DC
  Administered 2013-02-15 – 2013-03-08 (×50): 500 mg via ORAL
  Filled 2013-02-15 (×69): qty 1

## 2013-02-15 MED ORDER — FERUMOXYTOL INJECTION 510 MG/17 ML
510.0000 mg | Freq: Once | INTRAVENOUS | Status: AC
  Administered 2013-02-15: 510 mg via INTRAVENOUS
  Filled 2013-02-15: qty 17

## 2013-02-15 NOTE — Progress Notes (Signed)
I examined this patient and discussed the care plan with Dr Benjamin Stain  and the [facility] team and agree with assessment and plan as documented in the progress note above. The CT scan did not indicate a cause for her abdominal discomfort and her remaining kidney was normal. She remains very nauseated. I suspect that azotemia is contributing.

## 2013-02-15 NOTE — Progress Notes (Cosign Needed)
[facility] - PROGRESS NOTE Resident Note   Please see below for attending addendum to resident note.  Subjective:   No complaints this morning except for "hacking cough" that leads to emesis. Pt groggy after receiving phenergan at 9am. Wants to go back to sleep. Nurse states that emesis appears bilious and is not blood tinged. Not fluid overloaded today.  Objective:    Vital Signs:   Temp:  [98.1 F (36.7 C)-98.9 F (37.2 C)] 98.2 F (36.8 C) (04/02 0758) Pulse Rate:  [62-73] 63 (04/02 0338) Resp:  [12-27] 12 (04/02 0338) BP: (133-146)/(55-64) 146/64 mmHg (04/02 0338) SpO2:  [94 %-99 %] 97 % (04/02 0338) Last BM Date: 02/15/13  24-hour weight change: Weight change:   Weight trends: Filed Weights   02/14/13 0030  Weight: 83.3 kg (183 lb 10.3 oz)    Intake/Output:  04/01 0701 - 04/02 0700 In: 1425 [I.V.:1375; IV Piggyback:50] Out: 1450 [Urine:1450]  Physical Exam: General: Vital signs reviewed and noted. Well-developed, well-nourished, in no acute distress; pt did not participate due to sleepiness after being given Phenergan.  Lungs:  Normal respiratory effort. Clear to auscultation BL without crackles or wheezes.  Heart: Distant heart sounds. RRR. S1 and S2 normal without gallop, murmur, or rubs.  Abdomen:  BS normoactive. Soft, Nondistended, non-tender.  No masses or organomegaly.  Back ? CVA tenderness BL, L>R.  Extremities: No pretibial edema.     Labs: Basic Metabolic Panel:  Recent Labs Lab 02/13/13 1942 02/14/13 0449 02/15/13 0430  NA 130* 134* 135  K 5.9* 5.2* 4.0  CL 102 105 98  CO2 <7* 8* 19  GLUCOSE 88 112* 127*  BUN 103* 101* 93*  CREATININE 8.72* 8.42* 7.54*  CALCIUM 8.3* 8.0* 6.1*  PHOS  --   --  5.1*    Liver Function Tests:  Recent Labs Lab 02/13/13 2153 02/15/13 0430  AST 6  --   ALT <5  --   ALKPHOS 128*  --   BILITOT 0.2*  --   PROT 7.3  --   ALBUMIN 3.0* 2.3*   No results found for this basename: LIPASE,  AMYLASE,  in the last 168 hours No results found for this basename: AMMONIA,  in the last 168 hours  CBC:  Recent Labs Lab 02/13/13 1942 02/14/13 0449 02/15/13 0430  WBC 12.6* 10.7* 10.5  HGB 8.8* 8.0* 7.3*  HCT 27.3* 24.7* 21.3*  MCV 89.8 88.8 86.9  PLT 366 341 281    Cardiac Enzymes: No results found for this basename: CKTOTAL, CKMB, TROPONINI,  in the last 168 hours  BNP: No components found with this basename: POCBNP,   CBG:  Recent Labs Lab 02/14/13 0807 02/14/13 1258 02/14/13 1749 02/14/13 2346 02/15/13 0801  GLUCAP 108* 105* 149* 108* 122*    Microbiology: Results for orders placed during the hospital encounter of 02/13/13  URINE CULTURE     Status: None   Collection Time    02/13/13 11:15 PM      Result Value Range Status   Specimen Description URINE, RANDOM   Final   Special Requests ADDED 2350   Final   Culture  Setup Time 02/14/2013 00:00   Final   Colony Count >=100,000 COLONIES/ML   Final   Culture ESCHERICHIA COLI   Final   Report Status PENDING   Incomplete  MRSA PCR SCREENING     Status: None   Collection Time    02/14/13  2:30 AM      Result Value  Range Status   MRSA by PCR NEGATIVE  NEGATIVE Final   Comment:            The GeneXpert MRSA Assay (FDA     approved for NASAL specimens     only), is one component of a     comprehensive MRSA colonization     surveillance program. It is not     intended to diagnose MRSA     infection nor to guide or     monitor treatment for     MRSA infections.    Coagulation Studies:  Recent Labs  02/13/13 2323  LABPROT 15.1  INR 1.21    Urinalysis:    Component Value Date/Time   COLORURINE YELLOW 02/13/2013 2315   APPEARANCEUR TURBID* 02/13/2013 2315   LABSPEC 1.015 02/13/2013 2315   PHURINE 5.5 02/13/2013 2315   GLUCOSEU NEGATIVE 02/13/2013 2315   HGBUR LARGE* 02/13/2013 2315   BILIRUBINUR NEGATIVE 02/13/2013 2315   BILIRUBINUR NEG 07/14/2011 1701   KETONESUR NEGATIVE 02/13/2013 2315    PROTEINUR 30* 02/13/2013 2315   UROBILINOGEN 0.2 02/13/2013 2315   UROBILINOGEN 0.2 07/14/2011 1701   NITRITE NEGATIVE 02/13/2013 2315   NITRITE NEG 07/14/2011 1701   LEUKOCYTESUR LARGE* 02/13/2013 2315     Imaging: US Renal  02/14/2013  *RADIOLOGY REPORT*  Clinical Data: Acute renal injury uremia, prior right nephrectomy  RENAL/URINARY TRACT ULTRASOUND COMPLETE  Comparison:  03/11/2011 Correlation:  CT abdomen and pelvis 03/16/2011  Findings:  Right Kidney:  Surgically absent  Left Kidney:  11.6 cm length.  Normal cortical thickness.  Grossly normal cortical echogenicity with limitations of exam secondary to body habitus.  Mild fullness of the collecting system at the inferior pole of the left kidney is identified. No evidence of duplicated collecting system on prior CT.  No definite mass or shadowing calcification.  No perinephric fluid.  Bladder:  Foley catheter present.  Only contains mild amount of urine.  No gross wall thickening or mass.  IMPRESSION: Post right nephrectomy. Fullness of collecting system at inferior pole of left kidney without gross pelviectasis or evidence of obstruction.   Original Report Authenticated By: Ulyses Southward, M.D.    Dg Chest Portable 1 View  02/13/2013  *RADIOLOGY REPORT*  Clinical Data: Nausea and vomiting.  Hypotension.  PORTABLE CHEST - 1 VIEW  Comparison: 03/15/2011  Findings: Shallow inspiration.  Borderline heart size and pulmonary vascularity are probably normal for technique.  No focal consolidation or airspace disease in the lungs.  No blunting of costophrenic angles.  No pneumothorax.  Mediastinal contours appear intact.  IMPRESSION: Shallow inspiration.  No evidence of active pulmonary disease.   Original Report Authenticated By: Burman Nieves, M.D.       Medications:    Infusions: .  sodium bicarbonate  infusion 1000 mL 125 mL/hr at 02/15/13 0600    Scheduled Medications: . aspirin EC  81 mg Oral Daily  . calcium carbonate  1 tablet Oral TID WC  .  cefTRIAXone (ROCEPHIN)  IV  1 g Intravenous Q24H  . heparin  5,000 Units Subcutaneous Q8H  . insulin aspart  0-9 Units Subcutaneous TID WC  . sodium chloride  3 mL Intravenous Q12H    PRN Medications: acetaminophen, acetaminophen, cyclobenzaprine, ondansetron (ZOFRAN) IV, ondansetron, promethazine   Assessment/ Plan:    Pt is a 62 y.o. yo female with a PMHx of DM-2, muscle spasms, and emphysematous pylenephritis s/p right nephrectomy who presents with nausea/vomiting for approximately 2 weeks. Pt presented to the ED on  02/14/2013 for worsening of symptoms and was found to have acute kidney injury with associated metabolic acidosis and hyperkalemia. Renal was consulted for electrolyte imbalances and AKI.   1) Acute Kidney Injury (past hx R nephrectomy for "emphysematous pyelo") - Renal US done yesterday showed fullness of collecting system at inferior pole of L kidney without gross pelviectasis or evidence of obstruction. Can't see on [place] whether stones are present.  I'd suggest CT (no contrast) to see if we can identify reason for fullness of L lower pole  collecting system.  BUN/Cr down slightly from yesterday though still elevated. Corrected calcium 7.5 at 4am-- pt was given Ca carbonate 500 mg at 7:35am. Continue IVF, bicarb 19 today. Will continue to follow labs. Renal function improving with volume and bicarb--would continue.  Urine culture > 100K e coli (sens pending)--on ceftriaxone now.   2) Hyperkalemia - improved to 4.   3) Metabolic Acidosis - acidosis correcting, bicarb 19. Pt likely had metabolic acidosis from N/V/D that caused renal failure.   4) Anemia - FOBT x 2 neg. Fe panel sat 23%, ferritin 495, folate 8.4. Rx IV Fe then Aranesp.  I'd suggest d/c heparin and check stool hemocult.  Transfuse for hbg < 7.    5) HTN/Vol - making good urine foley inplace, no evidence of volume depletion, no pretibial edema, BP 146/64.    Length of Stay: 2 days  Patient history and plan of care  reviewed with attending, Dr. Marina Gravel.    Winnifred Friar, Student-PA  02/15/2013, 10:12 AM I examined Mrs. Terra, reviewed studies,  and agree with plans as outlined above by Celesta Gentile, PA student.   Deeann Cree, MD

## 2013-02-15 NOTE — Progress Notes (Signed)
Family Medicine Teaching Service Daily Progress Note Service Page: [phone]  Subjective:  Chest discomfort with emesis overnight that resolved after GI cocktail. During rounds this morning, pt was groggy and stated she just wanted to sleep after receiving phenergan at 9 AM. Per nursing, some green-colored emesis this AM.  Medications - Received PRN zofran 6am, prn phenergan 9am  Objective: Temp:  [98.1 F (36.7 C)-98.9 F (37.2 C)] 98.9 F (37.2 C) (04/02 0338) Pulse Rate:  [62-73] 63 (04/02 0338) Resp:  [12-27] 12 (04/02 0338) BP: (133-146)/(55-64) 146/64 mmHg (04/02 0338) SpO2:  [94 %-99 %] 97 % (04/02 0338) on 2L (started around midnight)   Intake/Output Summary (Last 24 hours) at 02/15/13 0741 Last data filed at 02/15/13 0400  Gross per 24 hour  Intake   1425 ml  Output   1450 ml  Net    -25 ml   Exam: Patient did not participate in exam due to grogginess General: resting comfortably in bed, NAD. Cardiovascular: RRR. No murmurs, rubs, or gallops. Distant. Respiratory: CTAB. No rales, rhonchi, or wheezing appreciated.  Normal effort. Abdomen: obese, soft, nontender. Extremities: No lower extremity edema; tenderness to shin palpation; no size difference. Neuro: No focal deficits. Groggy. Slurred speech that was incoherent at times as pt was falling asleep BACK: Bilateral lower back tenderness possible CVA, Left>Right  CBC BMET   Recent Labs Lab 02/13/13 1942 02/14/13 0449 02/15/13 0430  WBC 12.6* 10.7* 10.5  HGB 8.8* 8.0* 7.3*  HCT 27.3* 24.7* 21.3*  PLT 366 341 281    Recent Labs Lab 02/13/13 1942 02/14/13 0449 02/15/13 0430  NA 130* 134* 135  K 5.9* 5.2* 4.0  CL 102 105 98  CO2 <7* 8* 19  BUN 103* 101* 93*  CREATININE 8.72* 8.42* 7.54*  GLUCOSE 88 112* 127*  CALCIUM 8.3* 8.0* 6.1*    Calcium corrected to 7.46  Imaging/Diagnostic Tests:  Dg Chest Portable 1 View 02/13/2013  *RADIOLOGY REPORT*  Clinical Data: Nausea and vomiting.  Hypotension.   PORTABLE CHEST - 1 VIEW  Comparison: 03/15/2011  Findings: Shallow inspiration.  Borderline heart size and pulmonary vascularity are probably normal for technique.  No focal consolidation or airspace disease in the lungs.  No blunting of costophrenic angles.  No pneumothorax.  Mediastinal contours appear intact.  IMPRESSION: Shallow inspiration.  No evidence of active pulmonary disease.  Renal US 4/1: IMPRESSION:  Post right nephrectomy.  Fullness of collecting system at inferior pole of left kidney  without gross pelviectasis or evidence of obstruction.  FOBT neg x 2 EKG yesterday 4 pm no STEMI, wnl   Assessment/Plan: [name] [name] is a 62 y.o. year old female presenting with PMH of DM-2, muscle spasms, and emphysematous pylenephritis s/p right nephrectomy who presents with nausea/vomiting for approximately 2 weeks. In the ED, patient found to have acute kidney injury with associated metabolic acidosis and hyperkalemia.   # AKI  - BUN/Creatinine elevated at 103/8.7200>7.5 today - Renal consulted recommending continuing IV fluids with bicarb - D5 NS with 150 mEq of Bicarb. Greatly appreciate their consult and further recs.  - Renal ultrasound with no evidence for obstruction, however with full-appearing kidney. With h/o previous emphysematous pyelonephritis, favor CT abdomen/pelvis without contrast per renal recs.  - Watch labs; if no improvement, may need dialysis tomorrow - Gave calcium carbonate for low calcium - Consider urology consult if pt has not seen them in the past, pending CT read.  # Hyperkalemia  - Potassium 5.9 on admission with mild peaking  of T waves. Kayexalate 30 g x 1 with improvement to 4.  # Metabolic acidosis - Anion gap 21.  Delta:Delta ratio  <1 indicating mix AG metabolic acidosis (consider Uremia/renal failure) and non-anion gap metabolic acidosis (likely from diarrhea); lactic acid WNL - IV fluids with Bicarb as above per Renal recommendations.   # UTI -  Urine Cx resulted >100,000 cfu e coli with sensitivities pending - continue rocephin and f/u sensitivities. - Given symptoms and AKI, continue empiric rocephin while awaiting Culture results.   # Nausea/vomiting/diarrhea - unclear etiology though consider azotemia, gastroenteritis; no elevated lactic acid - Zofran PRN nausea  - Clear liquid diet  - IV fluids as above; consider hemodialysis per renal recs - C difficile study if no improvement   # DM-2 - A1c 6.1. - CBG's TID AC and QHS  - Sensitive SSI   # Anemia  - Iron panel with sat 23%, ferritin 495, folate 8.4; IV Fe per renal - FOBT neg x 2 - IV iron started - Will monitor closely during admission   # Muscle spasms of back  - Discontinued flexeril (pt has not received in >24 hours), Tylenol PRN   # Oxygen requirement overnight - O2 at 2L put on overnight, with no information about respiratory distress called to team. When asked nursing staff about this, nurse also got no signout about patient needing O2 and trialed pt off of it. O2 saturation remained 99-100% on room air.  FEN/GI: D5 NS with 150 mEq NaHCO3; Clear liquid diet  Prophylaxis: Discontinued heparin given low hgb and azotemia; SCDs ordered Disposition: Pending clinical improvement, CT abdomen/pelvis, and further renal management for AKI Code Status: Full code.   Simone Curia, MD 02/15/2013, 7:41 AM

## 2013-02-15 NOTE — Progress Notes (Signed)
CRITICAL VALUE ALERT  Critical value received: Ca 6.1 Date of notification:  02/14/13  Time of notification: 0558    Critical value read back:yes  Nurse who received alert:  Vivi Martens RN   MD notified (1st page):  MD on call for Family Med  Time of first page:  0558  MD notified (2nd page):  Time of second page:  Responding MD:    Time MD responded:  0600  Orders received from MD on call

## 2013-02-15 NOTE — Progress Notes (Signed)
Brief Nutrition Consult Note  Consulted due to patient's dislike of sweet foods on her meal trays.  She has been receiving a full liquid diet.  Patient reports that she is unable to eat a lot of sweet foods, such as pudding and ice cream.  She is willing to try solid foods.  Diet advanced to CHO-modified medium for supper tonight.  Discussed patient's concerns with patient and her husband; both were appreciative of RD visit.  Nutritional services ambassador to visit patient for meal preferences within current diet order.  No further nutrition intervention needed at this time.  Please re-consult RD if further nutrition concerns arise.  Joaquin Courts, RD, LDN, CNSC Pager [phone] After Hours Pager [phone]

## 2013-02-15 NOTE — Progress Notes (Signed)
Physical Therapy Treatment Patient Details Name: [name] [name] MRN: [id number] DOB: [birth year] Today's Date: 02/15/2013 Time: 4098-1191 PT Time Calculation (min): 22 min  PT Assessment / Plan / Recommendation Comments on Treatment Session  62 y.o. female admitted to [facility] with acute kidney injury.  She only has one kidney due to h/o nephrectomy.  She presents today needing a little more assistance today with gait due to increased fatigue/lethargy likely due to nausea meds.  She was able to walk a little further than yesterday.    Follow Up Recommendations  Home health PT     Does the patient have the potential to tolerate intense rehabilitation    NA  Barriers to Discharge   none      Equipment Recommendations  None recommended by PT    Recommendations for Other Services   none  Frequency Min 3X/week   Plan Discharge plan remains appropriate;Frequency remains appropriate    Precautions / Restrictions   none  Pertinent Vitals/Pain VSS on RA with gait    Mobility  Bed Mobility Bed Mobility: Supine to Sit;Sitting - Scoot to Edge of Bed;Sit to Supine Supine to Sit: 6: Modified independent (Device/Increase time);HOB elevated;With rails Sitting - Scoot to Edge of Bed: 6: Modified independent (Device/Increase time);With rail Sit to Supine: 6: Modified independent (Device/Increase time);With rail;HOB elevated Details for Bed Mobility Assistance: heavy reliance on rails Transfers Sit to Stand: 5: Supervision;With upper extremity assist;From bed Stand to Sit: 5: Supervision;With upper extremity assist;To bed Details for Transfer Assistance: supervision for safety and line management Ambulation/Gait Ambulation/Gait Assistance: 4: Min guard Ambulation Distance (Feet): 150 Feet Assistive device: Rolling walker Ambulation/Gait Assistance Details: Min guard assist today due to increased grogginess likely due to nausea meds.  Also, Hgb is lower today which may affect her energy level.    Gait Pattern: Step-through pattern;Shuffle Gait velocity: 1.03 ft/sec (<1.8 ft/sec indicates risk for recurrent falls).   General Gait Details: Walked on RA, O2 sats stable.        PT Goals Acute Rehab PT Goals PT Goal: Sit to Stand - Progress: Progressing toward goal PT Goal: Stand to Sit - Progress: Progressing toward goal PT Goal: Ambulate - Progress: Progressing toward goal  Visit Information  Last PT Received On: 02/15/13 Assistance Needed: +1    Subjective Data  Subjective: Pt reports nausea and vomiting this morning.  Had some medicine for nausea and is now lethargic and tired.     Cognition  Cognition Overall Cognitive Status: Appears within functional limits for tasks assessed/performed Arousal/Alertness: Awake/alert Orientation Level: Appears intact for tasks assessed Behavior During Session: [facility] for tasks performed Cognition - Other Comments: pt HOH so have to speak loudly       End of Session PT - End of Session Activity Tolerance: Patient limited by fatigue Patient left: in bed;with call bell/phone within reach       [place] B. Suraiya Dickerson, PT, DPT [phone]   02/15/2013, 12:59 PM

## 2013-02-16 ENCOUNTER — Inpatient Hospital Stay (HOSPITAL_COMMUNITY): Payer: Medicaid Other

## 2013-02-16 LAB — GLUCOSE, CAPILLARY

## 2013-02-16 LAB — CBC
HCT: 23.5 % — ABNORMAL LOW (ref 36.0–46.0)
Hemoglobin: 7.8 g/dL — ABNORMAL LOW (ref 12.0–15.0)
MCH: 29.1 pg (ref 26.0–34.0)
MCV: 87.7 fL (ref 78.0–100.0)
RBC: 2.68 MIL/uL — ABNORMAL LOW (ref 3.87–5.11)
WBC: 11.2 10*3/uL — ABNORMAL HIGH (ref 4.0–10.5)

## 2013-02-16 LAB — RENAL FUNCTION PANEL
BUN: 85 mg/dL — ABNORMAL HIGH (ref 6–23)
CO2: 29 mEq/L (ref 19–32)
Chloride: 92 mEq/L — ABNORMAL LOW (ref 96–112)
Creatinine, Ser: 7.54 mg/dL — ABNORMAL HIGH (ref 0.50–1.10)
GFR calc non Af Amer: 5 mL/min — ABNORMAL LOW (ref >90)
Glucose, Bld: 162 mg/dL — ABNORMAL HIGH (ref 70–99)

## 2013-02-16 LAB — MAGNESIUM: Magnesium: 1.6 mg/dL (ref 1.5–2.5)

## 2013-02-16 LAB — URINE CULTURE

## 2013-02-16 MED ORDER — SODIUM CHLORIDE 0.9 % IV SOLN
INTRAVENOUS | Status: DC
  Administered 2013-02-16: 12:00:00 via INTRAVENOUS

## 2013-02-16 MED ORDER — PANTOPRAZOLE SODIUM 40 MG PO TBEC
40.0000 mg | DELAYED_RELEASE_TABLET | Freq: Every day | ORAL | Status: DC
  Administered 2013-02-17 – 2013-03-08 (×18): 40 mg via ORAL
  Filled 2013-02-16 (×15): qty 1

## 2013-02-16 MED ORDER — METOCLOPRAMIDE HCL 5 MG/ML IJ SOLN
5.0000 mg | Freq: Four times a day (QID) | INTRAMUSCULAR | Status: DC
  Administered 2013-02-16 – 2013-02-17 (×4): 5 mg via INTRAVENOUS
  Filled 2013-02-16 (×7): qty 1

## 2013-02-16 MED ORDER — POTASSIUM CHLORIDE 10 MEQ/100ML IV SOLN
10.0000 meq | INTRAVENOUS | Status: AC
  Administered 2013-02-16 (×4): 10 meq via INTRAVENOUS
  Filled 2013-02-16 (×2): qty 100
  Filled 2013-02-16: qty 200

## 2013-02-16 MED ORDER — PROMETHAZINE HCL 25 MG/ML IJ SOLN
12.5000 mg | Freq: Four times a day (QID) | INTRAMUSCULAR | Status: DC | PRN
  Administered 2013-02-16 – 2013-02-21 (×4): 12.5 mg via INTRAVENOUS
  Filled 2013-02-16 (×4): qty 1

## 2013-02-16 NOTE — Progress Notes (Signed)
[facility] - PROGRESS NOTE Resident Note   Please see below for attending addendum to resident note.  Subjective:   Vomited 4 times this morning.  No blood in vomit IV bicarb at 125 / hr Objective:    Vital Signs:   Temp:  [97.5 F (36.4 C)-98.5 F (36.9 C)] 97.5 F (36.4 C) (04/03 0715) Pulse Rate:  [59-64] 60 (04/03 0715) Resp:  [12-25] 21 (04/03 0715) BP: (144-173)/(57-87) 167/65 mmHg (04/03 0715) SpO2:  [88 %-97 %] 92 % (04/03 0715) Last BM Date: 02/15/13  24-hour weight change: Weight change:   Weight trends: Filed Weights   02/14/13 0030  Weight: 83.3 kg (183 lb 10.3 oz)  weight 87.9 kg today (3 Apr)  Intake/Output:  04/02 0701 - 04/03 0700 In: 3165 [P.O.:240; I.V.:2875; IV Piggyback:50] Out: 2150 [Urine:2150]  Physical Exam: General: Vital signs reviewed and noted. Well-developed, well-nourished, in no acute distress; alert, appropriate and cooperative throughout examination.  Lungs:  Normal respiratory effort. Clear to auscultation BL without crackles or wheezes.  Heart: RRR. S1 and S2 normal without gallop, murmur, or rubs.  Abdomen:  BS normoactive. Soft, Nondistended, non-tender.  No masses or organomegaly.  Extremities: No pretibial edema.  Back Tenderness to palpation: L flank area     Labs: Basic Metabolic Panel:  Recent Labs Lab 02/14/13 0449 02/15/13 0430 02/16/13 0502  NA 134* 135 137  K 5.2* 4.0 3.1*  CL 105 98 92*  CO2 8* 19 29  GLUCOSE 112* 127* 162*  BUN 101* 93* 85*  CREATININE 8.42* 7.54* 7.54*  CALCIUM 8.0* 6.1* 5.9*  PHOS  --  5.1* 5.5*    Liver Function Tests:  Recent Labs Lab 02/13/13 2153 02/15/13 0430 02/16/13 0502  AST 6  --   --   ALT <5  --   --   ALKPHOS 128*  --   --   BILITOT 0.2*  --   --   PROT 7.3  --   --   ALBUMIN 3.0* 2.3* 2.3*   No results found for this basename: LIPASE, AMYLASE,  in the last 168 hours No results found for this basename: AMMONIA,  in the last 168  hours  CBC:  Recent Labs Lab 02/14/13 0449 02/15/13 0430 02/16/13 0502  WBC 10.7* 10.5 11.2*  HGB 8.0* 7.3* 7.8*  HCT 24.7* 21.3* 23.5*  MCV 88.8 86.9 87.7  PLT 341 281 308     CBG:  Recent Labs Lab 02/15/13 0801 02/15/13 1159 02/15/13 1644 02/15/13 2137 02/16/13 0716  GLUCAP 122* 125* 142* 128* 162*     Medications:    Infusions: .  sodium bicarbonate  infusion 1000 mL 125 mL/hr at 02/15/13 2326    Scheduled Medications: . aspirin EC  81 mg Oral Daily  . calcium carbonate  1 tablet Oral TID WC  . cefTRIAXone (ROCEPHIN)  IV  1 g Intravenous Q24H  . darbepoetin (ARANESP) injection - NON-DIALYSIS  100 mcg Subcutaneous Q Wed-1800  . insulin aspart  0-9 Units Subcutaneous TID WC  . sodium chloride  3 mL Intravenous Q12H    PRN Medications: acetaminophen, acetaminophen, ondansetron (ZOFRAN) IV, ondansetron, promethazine   Assessment/ Plan:    Pt is a 62 y.o. yo female with a PMHx of DM-2, muscle spasms, and emphysematous pylenephritis s/p right nephrectomy who presents with nausea/vomiting for approximately 2 weeks. Pt presented to the ED on 02/14/2013 for worsening of symptoms and was found to have acute kidney injury with associated metabolic acidosis and hyperkalemia. Renal  was consulted for electrolyte imbalances and AKI.  1) Acute Kidney Injury-- (past hx R nephrectomy for "emphysematous pyelo") - Renal USshowed fullness in L lower pole collecting asystem. CT showed no kidney stones.  Cr was coming down with IVF, but has leveled off today.  >100 K E coli in urine (on ceftriaxone--sensitivities pending).  She's already lost one kidney(so we know she has seen urologist) to pyelo.  I rec that urology be contacted to see if there's any problem with L kidney (fullness in L lower pole collecting system) or bladder predisposing her to UTIs.  Check CXR to be sure she's not becoming fluid overloaded 2) Hyperkalemia-- - K is low now--needs KCl (will give IV since she's  vomiting)  3) Metabolic Acidosis - acidosis corrected bicarb 29.  d/c IV isotonic bicarb--continue IVF (1/2 NS alternating with NS 4) Anemia-- FOBT neg. Fe panel sat 23%, ferritin 495, folate 8.4. On IV Fe and  Aranesp. Transfuse for hgb <7.  5) HTN/Vol -- making good urine foley in place, no evidence of volume depletion, no pretibial edema, BP 167/65. 6) Vomiting-- this has persisted since admission.  Rec H2 blocker and if no better ask GI to see 7) UTI---E coli, sens pending, on ceftriaxone.  8) Hypocalcemia-- check Mg   Length of Stay: 3 days  Patient history and plan of care reviewed with attending, Dr. Marina Gravel.   Winnifred Friar, Student-PA  02/16/2013, 9:32 AM

## 2013-02-16 NOTE — Progress Notes (Signed)
Dr Caryn Section had suggested to the nurse Reglan for her nausea. Review of her diabetes control showed that A1c's have been good over the last year, but she had an A1c of 14 when diagnosed and has some numbness in her toes, so I ordered the metoclopramide IV in hopes it will improve her nausea.

## 2013-02-16 NOTE — Progress Notes (Signed)
I examined this patient and discussed the care plan with Dr Benjamin Stain  and the [facility] team and agree with assessment and plan as documented in the progress note above.  We stopped Cyclobenzaprine due to concern that it would contribute to urinary retention, but she says she hadn't been on it for over a year before this hospitalization. It was Dr Laverle Patter who did her nephrectomy.

## 2013-02-16 NOTE — Progress Notes (Signed)
Family Medicine Teaching Service Daily Progress Note Service Page: [phone]  Subjective:  - Pt reporting continued nausea and vomiting, nausea even when just watching TV. Tried food yesterday unsuccessfully. Keeping down liquids.  - Small stool this AM that improved symptoms. Denies flatus.  - Mild sore abdominal pain she thinks from vomiting.  - Chronic foot and back pain.  - Mild SOB when lay on side.  Medications - Received PRN zofran 11PM, prn phenergan 5Am  Objective: BP 167/65  Pulse 60  Temp(Src) 97.5 F (36.4 C) (Oral)  Resp 21  Ht 5\' 5"  (1.651 m)  Wt 183 lb 10.3 oz (83.3 kg)  BMI 30.56 kg/m2  SpO2 92%  (88% overnight -->2L [place], resolved, resolved in AM so d/c [place])  Intake/Output Summary (Last 24 hours) at 02/16/13 0950 Last data filed at 02/16/13 0700  Gross per 24 hour  Intake   2915 ml  Output   1825 ml  Net   1090 ml   Exam: General: resting comfortably in bed, NAD, conversant, less groggy than yest Cardiovascular: RRR. No murmurs, rubs, or gallops. Distant. Respiratory: Mild left upper lobe expiratory wheeze and faint crackles. No rales, rhonchi appreciated. Normal effort. Abdomen: obese, soft, nondistended, mild generalized tenderness, NABS. Extremities: No lower extremity edema; tenderness to shin palpation; no size difference; SCD on. Neuro: No focal deficits. Normal speech, seems hard of hearing BACK: Bilateral lower back tenderness possible CVA, Left>Right  CBC BMET   Recent Labs Lab 02/14/13 0449 02/15/13 0430 02/16/13 0502  WBC 10.7* 10.5 11.2*  HGB 8.0* 7.3* 7.8*  HCT 24.7* 21.3* 23.5*  PLT 341 281 308    Recent Labs Lab 02/14/13 0449 02/15/13 0430 02/16/13 0502  NA 134* 135 137  K 5.2* 4.0 3.1*  CL 105 98 92*  CO2 8* 19 29  BUN 101* 93* 85*  CREATININE 8.42* 7.54* 7.54*  GLUCOSE 112* 127* 162*  CALCIUM 8.0* 6.1* 5.9*    Calcium corrected to 7.3 today <--7.46 yest  Imaging/Diagnostic Tests:  Dg Chest Portable 1 View 02/13/2013   *RADIOLOGY REPORT*  Clinical Data: Nausea and vomiting.  Hypotension.  PORTABLE CHEST - 1 VIEW  Comparison: 03/15/2011  Findings: Shallow inspiration.  Borderline heart size and pulmonary vascularity are probably normal for technique.  No focal consolidation or airspace disease in the lungs.  No blunting of costophrenic angles.  No pneumothorax.  Mediastinal contours appear intact.  IMPRESSION: Shallow inspiration.  No evidence of active pulmonary disease.  Renal US 4/1: IMPRESSION:  Post right nephrectomy.  Fullness of collecting system at inferior pole of left kidney  without gross pelviectasis or evidence of obstruction.  FOBT neg x 2 EKG yesterday 4 pm no STEMI, wnl  CT abd/pelvis: IMPRESSION:  1. Previous right nephrectomy. No evidence of urolithiasis or  hydronephrosis.  2. Sigmoid diverticulosis. No radiographic evidence of  diverticulitis.  3. Tiny less than 1 cm calcified uterine fibroid.  4. Small bilateral pleural effusions, and bibasilar interstitial  infiltrates suspicious for pulmonary edema.   Assessment/Plan: [name] [name] is a 62 y.o. year old female presenting with PMH of DM-2, muscle spasms, and emphysematous pylenephritis s/p right nephrectomy who presents with nausea/vomiting for approximately 2 weeks. In the ED, patient found to have acute kidney injury with associated metabolic acidosis and hyperkalemia.   # AKI - Thought due most likely to nausea/vomiting preceding and during this admission leading to dehydration. Renal following and we greatly appreciate their recommendations. - BUN/Creatinine elevated at 103/8.7200>7.5 today, stable - Discontinuing  bicarb in IV fluids today, continue D5 NS at 161mL/hr - Renal ultrasound with no evidence for obstruction, however with full-appearing kidney. With h/o previous emphysematous pyelonephritis, will consult urology for further recommendations. - Continue calcium carbonate for low calcium - No HD at this time  #  Hyperkalemia  - Potassium 5.9 on admission with mild peaking of T waves. Kayexalate 30 g x 1 with improvement to 4 -->now with K 3.1  - Dosing K per renal - Checking Mg level today  # Metabolic acidosis - Thought to be likely from diarrhea. Corrected after IV fluids with bicarbonate, so d/c'ed bicarbonate today.   # UTI - Urine Cx resulted >100,000 cfu e coli with sensitivities pending - continue rocephin and f/u sensitivities.  # Nausea/vomiting/diarrhea - Thought to be cause of AKI. Unclear etiology, as the long duration of symptoms makes gastroenteritis less likely; no elevated lactic acid - Will start protonix and if no improvement, will order GI consult and test c difficile - Zofran PRN nausea  - Clear liquid diet  - IV fluids as above  # DM-2 - A1c 6.1. - CBG's TID AC and QHS  - Sensitive SSI   # Anemia - Iron panel with sat 23%, ferritin 495, folate 8.4; IV Fe per renal, FOBT neg x 2 - IV iron started - Will monitor closely during admission   # Muscle spasms of back  - Discontinued flexeril (pt has not received in >24 hours) due to anticholinergic effects - Tylenol PRN   # Oxygen requirement overnight - Resolves when pt awake per nursing and she is otherwise stable. with CT showing small bilateral pleural effusion and bibasilar interstitial infiltrate, consider atelectasis - Continue O2 at 2L as needed when asleep - Chest x-ray to further evaluate - IS ordered - Encourage ambulation with assistance ordered - Consider echocardiogram if more signs of fluid overload  # Hypertension - BP 173/71 today;  - Monitor and consider adding antihypertensive  # Diverticulosis - No evidence of diverticulitis on CT. If develops fever or other signs of infection, consider starting antibiotics for diverticulitis  FEN/GI: D5 NS at 188mL/hr; Clear liquid diet  Prophylaxis: Discontinued heparin given low hgb and azotemia; SCDs  Disposition: Pending clinical improvement and further renal  management for AKI Code Status: Full code.   Simone Curia, MD 02/16/2013, 9:50 AM

## 2013-02-16 NOTE — Consult Note (Signed)
Urology Consult  Referring physician: Dr Ermalinda Memos Reason for referral: renal failure  Chief Complaint: Patient had previous rt nephrectomy for emphysematous pylo; admitted and being treated for UTI and renal insufficiency  History of Present Illness: as above; E Coli; on rocephin; admitted with nausea and vomiting and Cr >8; Cr 5.9 today; questionable lower pole on u/sound; Normal CT urogram- no obstruction of stone;  Modifying factors: There are no other modifying factors  Associated signs and symptoms: There are no other associated signs and symptoms Aggravating and relieving factors: There are no other aggravating or relieving factors Severity: Moderate Duration: Persistent  Past Medical History  Diagnosis Date  . Hearing difficulty     Mild   . Diabetes mellitus without complication   . Night muscle spasms   . Bursitis   . Fatigue    Past Surgical History  Procedure Laterality Date  . Right nephrectomy  03/2011    For emphysematous pyelonephritis    Medications: I have reviewed the patient's current medications. Allergies:  Allergies  Allergen Reactions  . Aleve (Naproxen Sodium)     Due to kidneys.  . Motrin (Ibuprofen)     Due to kidneys.    Family History  Problem Relation Age of Onset  . Heart disease    . Diabetes    . Stroke    . Bone cancer Father     Of jaws   . Throat cancer Father    Social History:  reports that she has never smoked. She does not have any smokeless tobacco history on file. She reports that she does not drink alcohol or use illicit drugs.  ROS: All systems are reviewed and negative except as noted. Rest ROS neg  Physical Exam:  Vital signs in last 24 hours: Temp:  [97.4 F (36.3 C)-98.5 F (36.9 C)] 98.4 F (36.9 C) (04/03 1516) Pulse Rate:  [58-64] 58 (04/03 1100) Resp:  [16-25] 18 (04/03 1100) BP: (145-173)/(65-87) 158/69 mmHg (04/03 1100) SpO2:  [92 %-97 %] 96 % (04/03 1100) Weight:  [87.9 kg (193 lb 12.6 oz)] 87.9 kg  (193 lb 12.6 oz) (04/03 0715)  Cardiovascular: Skin warm; not flushed Respiratory: Breaths quiet; no shortness of breath Abdomen: No masses Neurological: Normal sensation to touch Musculoskeletal: Normal motor function arms and legs Lymphatics: No inguinal adenopathy Skin: No rashes Genitourinary:no exam  Laboratory Data:  Results for orders placed during the hospital encounter of 02/13/13 (from the past 72 hour(s))  CBC     Status: Abnormal   Collection Time    02/13/13  7:42 PM      Result Value Range   WBC 12.6 (*) 4.0 - 10.5 K/uL   RBC 3.04 (*) 3.87 - 5.11 MIL/uL   Hemoglobin 8.8 (*) 12.0 - 15.0 g/dL   HCT 45.4 (*) 09.8 - 11.9 %   MCV 89.8  78.0 - 100.0 fL   MCH 28.9  26.0 - 34.0 pg   MCHC 32.2  30.0 - 36.0 g/dL   RDW 14.7  82.9 - 56.2 %   Platelets 366  150 - 400 K/uL  BASIC METABOLIC PANEL     Status: Abnormal   Collection Time    02/13/13  7:42 PM      Result Value Range   Sodium 130 (*) 135 - 145 mEq/L   Potassium 5.9 (*) 3.5 - 5.1 mEq/L   Comment: NO VISIBLE HEMOLYSIS   Chloride 102  96 - 112 mEq/L   CO2 <7 (*) 19 - 32 mEq/L  Comment: CRITICAL RESULT CALLED TO, READ BACK BY AND VERIFIED WITH:     PRICE A,RN 02/13/13 2059 WAYK     REPEATED TO VERIFY   Glucose, Bld 88  70 - 99 mg/dL   BUN 161 (*) 6 - 23 mg/dL   Creatinine, Ser 0.96 (*) 0.50 - 1.10 mg/dL   Calcium 8.3 (*) 8.4 - 10.5 mg/dL   GFR calc non Af Amer 4 (*) >90 mL/min   GFR calc Af Amer 5 (*) >90 mL/min   Comment:            The eGFR has been calculated     using the CKD EPI equation.     This calculation has not been     validated in all clinical     situations.     eGFR's persistently     <90 mL/min signify     possible Chronic Kidney Disease.  HEPATIC FUNCTION PANEL     Status: Abnormal   Collection Time    02/13/13  9:53 PM      Result Value Range   Total Protein 7.3  6.0 - 8.3 g/dL   Albumin 3.0 (*) 3.5 - 5.2 g/dL   AST 6  0 - 37 U/L   ALT <5  0 - 35 U/L   Alkaline Phosphatase 128 (*)  39 - 117 U/L   Total Bilirubin 0.2 (*) 0.3 - 1.2 mg/dL   Bilirubin, Direct <0.4  0.0 - 0.3 mg/dL   Indirect Bilirubin NOT CALCULATED  0.3 - 0.9 mg/dL  LACTIC ACID, PLASMA     Status: None   Collection Time    02/13/13  9:58 PM      Result Value Range   Lactic Acid, Venous 0.6  0.5 - 2.2 mmol/L  POCT I-STAT 3, BLOOD GAS (G3P V)     Status: Abnormal   Collection Time    02/13/13 10:49 PM      Result Value Range   pH, Ven 7.142 (*) 7.250 - 7.300   pCO2, Ven 20.5 (*) 45.0 - 50.0 mmHg   pO2, Ven 58.0 (*) 30.0 - 45.0 mmHg   Bicarbonate 7.0 (*) 20.0 - 24.0 mEq/L   TCO2 8  0 - 100 mmol/L   O2 Saturation 82.0     Acid-base deficit 20.0 (*) 0.0 - 2.0 mmol/L   Sample type VENOUS     Comment NOTIFIED PHYSICIAN    URINALYSIS, ROUTINE W REFLEX MICROSCOPIC     Status: Abnormal   Collection Time    02/13/13 11:15 PM      Result Value Range   Color, Urine YELLOW  YELLOW   APPearance TURBID (*) CLEAR   Specific Gravity, Urine 1.015  1.005 - 1.030   pH 5.5  5.0 - 8.0   Glucose, UA NEGATIVE  NEGATIVE mg/dL   Hgb urine dipstick LARGE (*) NEGATIVE   Bilirubin Urine NEGATIVE  NEGATIVE   Ketones, ur NEGATIVE  NEGATIVE mg/dL   Protein, ur 30 (*) NEGATIVE mg/dL   Urobilinogen, UA 0.2  0.0 - 1.0 mg/dL   Nitrite NEGATIVE  NEGATIVE   Leukocytes, UA LARGE (*) NEGATIVE  URINE MICROSCOPIC-ADD ON     Status: Abnormal   Collection Time    02/13/13 11:15 PM      Result Value Range   Squamous Epithelial / LPF FEW (*) RARE   WBC, UA TOO NUMEROUS TO COUNT  <3 WBC/hpf   RBC / HPF 7-10  <3 RBC/hpf   Bacteria, UA MANY (*)  RARE  URINE CULTURE     Status: None   Collection Time    02/13/13 11:15 PM      Result Value Range   Specimen Description URINE, RANDOM     Special Requests ADDED 2350     Culture  Setup Time 02/14/2013 00:00     Colony Count >=100,000 COLONIES/ML     Culture ESCHERICHIA COLI     Report Status 02/16/2013 FINAL     Organism ID, Bacteria ESCHERICHIA COLI    OCCULT BLOOD, POC DEVICE      Status: None   Collection Time    02/13/13 11:15 PM      Result Value Range   Fecal Occult Bld NEGATIVE  NEGATIVE  PROTIME-INR     Status: None   Collection Time    02/13/13 11:23 PM      Result Value Range   Prothrombin Time 15.1  11.6 - 15.2 seconds   INR 1.21  0.00 - 1.49  POCT I-STAT 3, BLOOD GAS (G3P V)     Status: Abnormal   Collection Time    02/13/13 11:58 PM      Result Value Range   pH, Ven 7.190 (*) 7.250 - 7.300   pCO2, Ven 14.5 (*) 45.0 - 50.0 mmHg   pO2, Ven 151.0 (*) 30.0 - 45.0 mmHg   Bicarbonate 5.5 (*) 20.0 - 24.0 mEq/L   TCO2 6  0 - 100 mmol/L   O2 Saturation 99.0     Acid-base deficit 21.0 (*) 0.0 - 2.0 mmol/L   Sample type VENOUS     Comment NOTIFIED PHYSICIAN    GLUCOSE, CAPILLARY     Status: None   Collection Time    02/14/13 12:11 AM      Result Value Range   Glucose-Capillary 79  70 - 99 mg/dL   Comment 1 Notify RN    MRSA PCR SCREENING     Status: None   Collection Time    02/14/13  2:30 AM      Result Value Range   MRSA by PCR NEGATIVE  NEGATIVE   Comment:            The GeneXpert MRSA Assay (FDA     approved for NASAL specimens     only), is one component of a     comprehensive MRSA colonization     surveillance program. It is not     intended to diagnose MRSA     infection nor to guide or     monitor treatment for     MRSA infections.  CBC     Status: Abnormal   Collection Time    02/14/13  4:49 AM      Result Value Range   WBC 10.7 (*) 4.0 - 10.5 K/uL   RBC 2.78 (*) 3.87 - 5.11 MIL/uL   Hemoglobin 8.0 (*) 12.0 - 15.0 g/dL   HCT 96.0 (*) 45.4 - 09.8 %   MCV 88.8  78.0 - 100.0 fL   MCH 28.8  26.0 - 34.0 pg   MCHC 32.4  30.0 - 36.0 g/dL   RDW 11.9  14.7 - 82.9 %   Platelets 341  150 - 400 K/uL  PREALBUMIN     Status: Abnormal   Collection Time    02/14/13  4:49 AM      Result Value Range   Prealbumin 12.4 (*) 17.0 - 34.0 mg/dL  BASIC METABOLIC PANEL     Status: Abnormal   Collection Time  02/14/13  4:49 AM      Result  Value Range   Sodium 134 (*) 135 - 145 mEq/L   Potassium 5.2 (*) 3.5 - 5.1 mEq/L   Chloride 105  96 - 112 mEq/L   CO2 8 (*) 19 - 32 mEq/L   Comment: CRITICAL RESULT CALLED TO, READ BACK BY AND VERIFIED WITH:     L.WORLEY RN 1610 02/14/13 E.GADDY   Glucose, Bld 112 (*) 70 - 99 mg/dL   BUN 960 (*) 6 - 23 mg/dL   Creatinine, Ser 4.54 (*) 0.50 - 1.10 mg/dL   Calcium 8.0 (*) 8.4 - 10.5 mg/dL   GFR calc non Af Amer 4 (*) >90 mL/min   GFR calc Af Amer 5 (*) >90 mL/min   Comment:            The eGFR has been calculated     using the CKD EPI equation.     This calculation has not been     validated in all clinical     situations.     eGFR's persistently     <90 mL/min signify     possible Chronic Kidney Disease.  VITAMIN B12     Status: None   Collection Time    02/14/13  4:49 AM      Result Value Range   Vitamin B-12 389  211 - 911 pg/mL  FOLATE     Status: None   Collection Time    02/14/13  4:49 AM      Result Value Range   Folate 8.4     Comment: (NOTE)     Reference Ranges            Deficient:       0.4 - 3.3 ng/mL            Indeterminate:   3.4 - 5.4 ng/mL            Normal:              > 5.4 ng/mL  IRON AND TIBC     Status: Abnormal   Collection Time    02/14/13  4:49 AM      Result Value Range   Iron 31 (*) 42 - 135 ug/dL   TIBC 098 (*) 119 - 147 ug/dL   Saturation Ratios 23  20 - 55 %   UIBC 106 (*) 125 - 400 ug/dL  FERRITIN     Status: Abnormal   Collection Time    02/14/13  4:49 AM      Result Value Range   Ferritin 495 (*) 10 - 291 ng/mL  RETICULOCYTES     Status: Abnormal   Collection Time    02/14/13  4:49 AM      Result Value Range   Retic Ct Pct 1.4  0.4 - 3.1 %   RBC. 2.78 (*) 3.87 - 5.11 MIL/uL   Retic Count, Manual 38.9  19.0 - 186.0 K/uL  HEMOGLOBIN A1C     Status: Abnormal   Collection Time    02/14/13  4:49 AM      Result Value Range   Hemoglobin A1C 6.1 (*) <5.7 %   Comment: (NOTE)  According to the ADA Clinical Practice Recommendations for 2011, when     HbA1c is used as a screening test:      >=6.5%   Diagnostic of Diabetes Mellitus               (if abnormal result is confirmed)     5.7-6.4%   Increased risk of developing Diabetes Mellitus     References:Diagnosis and Classification of Diabetes Mellitus,Diabetes     Care,2011,34(Suppl 1):S62-S69 and Standards of Medical Care in             Diabetes - 2011,Diabetes Care,2011,34 (Suppl 1):S11-S61.   Mean Plasma Glucose 128 (*) <117 mg/dL  LACTIC ACID, PLASMA     Status: None   Collection Time    02/14/13  8:07 AM      Result Value Range   Lactic Acid, Venous 0.5  0.5 - 2.2 mmol/L  GLUCOSE, CAPILLARY     Status: Abnormal   Collection Time    02/14/13  8:07 AM      Result Value Range   Glucose-Capillary 108 (*) 70 - 99 mg/dL  CREATININE, URINE, RANDOM     Status: None   Collection Time    02/14/13  8:32 AM      Result Value Range   Creatinine, Urine 44.70    NA AND K (SODIUM & POTASSIUM), RAND UR     Status: None   Collection Time    02/14/13  8:32 AM      Result Value Range   Sodium, Ur 66     Potassium Urine Timed 8    CHLORIDE, URINE, RANDOM     Status: None   Collection Time    02/14/13  8:32 AM      Result Value Range   Chloride Urine 44    URINE RAPID DRUG SCREEN (HOSP PERFORMED)     Status: None   Collection Time    02/14/13  8:32 AM      Result Value Range   Opiates NONE DETECTED  NONE DETECTED   Cocaine NONE DETECTED  NONE DETECTED   Benzodiazepines NONE DETECTED  NONE DETECTED   Amphetamines NONE DETECTED  NONE DETECTED   Tetrahydrocannabinol NONE DETECTED  NONE DETECTED   Barbiturates NONE DETECTED  NONE DETECTED   Comment:            DRUG SCREEN FOR MEDICAL PURPOSES     ONLY.  IF CONFIRMATION IS NEEDED     FOR ANY PURPOSE, NOTIFY LAB     WITHIN 5 DAYS.                LOWEST DETECTABLE LIMITS     FOR URINE DRUG SCREEN     Drug Class       Cutoff (ng/mL)      Amphetamine      1000     Barbiturate      200     Benzodiazepine   200     Tricyclics       300     Opiates          300     Cocaine          300     THC              50  GLUCOSE, CAPILLARY     Status: Abnormal   Collection Time    02/14/13 12:58 PM      Result Value Range   Glucose-Capillary 105 (*)  70 - 99 mg/dL  GLUCOSE, CAPILLARY     Status: Abnormal   Collection Time    02/14/13  5:49 PM      Result Value Range   Glucose-Capillary 149 (*) 70 - 99 mg/dL   Comment 1 Documented in Chart     Comment 2 Notify RN    GLUCOSE, CAPILLARY     Status: Abnormal   Collection Time    02/14/13 11:46 PM      Result Value Range   Glucose-Capillary 108 (*) 70 - 99 mg/dL   Comment 1 Notify RN     Comment 2 Documented in Chart    OCCULT BLOOD X 1 CARD TO LAB, STOOL     Status: None   Collection Time    02/15/13  1:00 AM      Result Value Range   Fecal Occult Bld NEGATIVE  NEGATIVE  OCCULT BLOOD X 1 CARD TO LAB, STOOL     Status: None   Collection Time    02/15/13  2:04 AM      Result Value Range   Fecal Occult Bld NEGATIVE  NEGATIVE  RENAL FUNCTION PANEL     Status: Abnormal   Collection Time    02/15/13  4:30 AM      Result Value Range   Sodium 135  135 - 145 mEq/L   Potassium 4.0  3.5 - 5.1 mEq/L   Comment: DELTA CHECK NOTED   Chloride 98  96 - 112 mEq/L   CO2 19  19 - 32 mEq/L   Glucose, Bld 127 (*) 70 - 99 mg/dL   BUN 93 (*) 6 - 23 mg/dL   Creatinine, Ser 7.84 (*) 0.50 - 1.10 mg/dL   Calcium 6.1 (*) 8.4 - 10.5 mg/dL   Comment: CRITICAL RESULT CALLED TO, READ BACK BY AND VERIFIED WITH:     BILLINGS,A RN 02/15/2013 0557 JORDANS   Phosphorus 5.1 (*) 2.3 - 4.6 mg/dL   Albumin 2.3 (*) 3.5 - 5.2 g/dL   GFR calc non Af Amer 5 (*) >90 mL/min   GFR calc Af Amer 6 (*) >90 mL/min   Comment:            The eGFR has been calculated     using the CKD EPI equation.     This calculation has not been     validated in all clinical     situations.     eGFR's persistently     <90  mL/min signify     possible Chronic Kidney Disease.  CBC     Status: Abnormal   Collection Time    02/15/13  4:30 AM      Result Value Range   WBC 10.5  4.0 - 10.5 K/uL   RBC 2.45 (*) 3.87 - 5.11 MIL/uL   Hemoglobin 7.3 (*) 12.0 - 15.0 g/dL   HCT 69.6 (*) 29.5 - 28.4 %   MCV 86.9  78.0 - 100.0 fL   MCH 29.8  26.0 - 34.0 pg   MCHC 34.3  30.0 - 36.0 g/dL   RDW 13.2  44.0 - 10.2 %   Platelets 281  150 - 400 K/uL  GLUCOSE, CAPILLARY     Status: Abnormal   Collection Time    02/15/13  8:01 AM      Result Value Range   Glucose-Capillary 122 (*) 70 - 99 mg/dL   Comment 1 Notify RN     Comment 2 Documented in Chart  GLUCOSE, CAPILLARY     Status: Abnormal   Collection Time    02/15/13 11:59 AM      Result Value Range   Glucose-Capillary 125 (*) 70 - 99 mg/dL   Comment 1 Notify RN     Comment 2 Documented in Chart    GLUCOSE, CAPILLARY     Status: Abnormal   Collection Time    02/15/13  4:44 PM      Result Value Range   Glucose-Capillary 142 (*) 70 - 99 mg/dL   Comment 1 Notify RN     Comment 2 Documented in Chart    GLUCOSE, CAPILLARY     Status: Abnormal   Collection Time    02/15/13  9:37 PM      Result Value Range   Glucose-Capillary 128 (*) 70 - 99 mg/dL   Comment 1 Notify RN     Comment 2 Documented in Chart    CBC     Status: Abnormal   Collection Time    02/16/13  5:02 AM      Result Value Range   WBC 11.2 (*) 4.0 - 10.5 K/uL   RBC 2.68 (*) 3.87 - 5.11 MIL/uL   Hemoglobin 7.8 (*) 12.0 - 15.0 g/dL   HCT 11.9 (*) 14.7 - 82.9 %   MCV 87.7  78.0 - 100.0 fL   MCH 29.1  26.0 - 34.0 pg   MCHC 33.2  30.0 - 36.0 g/dL   RDW 56.2  13.0 - 86.5 %   Platelets 308  150 - 400 K/uL  RENAL FUNCTION PANEL     Status: Abnormal   Collection Time    02/16/13  5:02 AM      Result Value Range   Sodium 137  135 - 145 mEq/L   Potassium 3.1 (*) 3.5 - 5.1 mEq/L   Chloride 92 (*) 96 - 112 mEq/L   CO2 29  19 - 32 mEq/L   Glucose, Bld 162 (*) 70 - 99 mg/dL   BUN 85 (*) 6 - 23 mg/dL    Creatinine, Ser 7.84 (*) 0.50 - 1.10 mg/dL   Calcium 5.9 (*) 8.4 - 10.5 mg/dL   Comment: CRITICAL RESULT CALLED TO, READ BACK BY AND VERIFIED WITH:     HITT, L. RN [phone] EBANKS COLCLOUGH, S   Phosphorus 5.5 (*) 2.3 - 4.6 mg/dL   Albumin 2.3 (*) 3.5 - 5.2 g/dL   GFR calc non Af Amer 5 (*) >90 mL/min   GFR calc Af Amer 6 (*) >90 mL/min   Comment:            The eGFR has been calculated     using the CKD EPI equation.     This calculation has not been     validated in all clinical     situations.     eGFR's persistently     <90 mL/min signify     possible Chronic Kidney Disease.  MAGNESIUM     Status: None   Collection Time    02/16/13  5:02 AM      Result Value Range   Magnesium 1.6  1.5 - 2.5 mg/dL  GLUCOSE, CAPILLARY     Status: Abnormal   Collection Time    02/16/13  7:16 AM      Result Value Range   Glucose-Capillary 162 (*) 70 - 99 mg/dL   Comment 1 Notify RN     Comment 2 Documented in Chart    GLUCOSE, CAPILLARY  Status: Abnormal   Collection Time    02/16/13 12:11 PM      Result Value Range   Glucose-Capillary 152 (*) 70 - 99 mg/dL   Comment 1 Notify RN     Comment 2 Documented in Chart     Recent Results (from the past 240 hour(s))  URINE CULTURE     Status: None   Collection Time    02/13/13 11:15 PM      Result Value Range Status   Specimen Description URINE, RANDOM   Final   Special Requests ADDED 2350   Final   Culture  Setup Time 02/14/2013 00:00   Final   Colony Count >=100,000 COLONIES/ML   Final   Culture ESCHERICHIA COLI   Final   Report Status 02/16/2013 FINAL   Final   Organism ID, Bacteria ESCHERICHIA COLI   Final  MRSA PCR SCREENING     Status: None   Collection Time    02/14/13  2:30 AM      Result Value Range Status   MRSA by PCR NEGATIVE  NEGATIVE Final   Comment:            The GeneXpert MRSA Assay (FDA     approved for NASAL specimens     only), is one component of a     comprehensive MRSA colonization     surveillance  program. It is not     intended to diagnose MRSA     infection nor to guide or     monitor treatment for     MRSA infections.   Creatinine:  Recent Labs  02/13/13 1942 02/14/13 0449 02/15/13 0430 02/16/13 0502  CREATININE 8.72* 8.42* 7.54* 7.54*    Xrays: See report/chart See above  Impression/Assessment:  Non-obstructive elevated Cr with UTI   Plan:  No urologic intervention; treat supportive Please call me if any changes SPoke with primary team Dr Ermalinda Memos  Adasia Hoar A 02/16/2013, 4:27 PM

## 2013-02-16 NOTE — Progress Notes (Signed)
CRITICAL VALUE ALERT  Critical value received:  Calcium 5.9  Date of notification:  02/16/2013  Time of notification:  0605  Critical value read back:yes  Nurse who received alert:  Leta Baptist RN  MD notified (1st page):  Earlene Plater  Time of first page:  0605  MD notified (2nd page):  Time of second page:  Responding MD:  Earlene Plater  Time MD responded:  [phone]

## 2013-02-17 DIAGNOSIS — F329 Major depressive disorder, single episode, unspecified: Secondary | ICD-10-CM

## 2013-02-17 LAB — CBC
HCT: 24.4 % — ABNORMAL LOW (ref 36.0–46.0)
Hemoglobin: 8 g/dL — ABNORMAL LOW (ref 12.0–15.0)
MCH: 29.7 pg (ref 26.0–34.0)
MCHC: 32.8 g/dL (ref 30.0–36.0)
MCV: 90.7 fL (ref 78.0–100.0)
RBC: 2.69 MIL/uL — ABNORMAL LOW (ref 3.87–5.11)

## 2013-02-17 LAB — RENAL FUNCTION PANEL
BUN: 79 mg/dL — ABNORMAL HIGH (ref 6–23)
CO2: 30 mEq/L (ref 19–32)
Calcium: 6 mg/dL — CL (ref 8.4–10.5)
GFR calc Af Amer: 6 mL/min — ABNORMAL LOW (ref >90)
Glucose, Bld: 106 mg/dL — ABNORMAL HIGH (ref 70–99)
Sodium: 134 mEq/L — ABNORMAL LOW (ref 135–145)

## 2013-02-17 LAB — GLUCOSE, CAPILLARY
Glucose-Capillary: 99 mg/dL (ref 70–99)
Glucose-Capillary: 100 mg/dL — ABNORMAL HIGH (ref 70–99)

## 2013-02-17 LAB — HEPATITIS B SURFACE ANTIGEN: Hepatitis B Surface Ag: NEGATIVE

## 2013-02-17 LAB — HEPATITIS B SURFACE ANTIBODY,QUALITATIVE: Hep B S Ab: NONREACTIVE

## 2013-02-17 MED ORDER — ALTEPLASE 2 MG IJ SOLR
2.0000 mg | Freq: Once | INTRAMUSCULAR | Status: DC | PRN
  Filled 2013-02-17: qty 2

## 2013-02-17 MED ORDER — HEPARIN SODIUM (PORCINE) 1000 UNIT/ML DIALYSIS
1000.0000 [IU] | INTRAMUSCULAR | Status: DC | PRN
  Administered 2013-02-17: 1000 [IU] via INTRAVENOUS_CENTRAL

## 2013-02-17 MED ORDER — HEPARIN SODIUM (PORCINE) 1000 UNIT/ML DIALYSIS
20.0000 [IU]/kg | INTRAMUSCULAR | Status: DC | PRN
  Administered 2013-02-17: 1800 [IU] via INTRAVENOUS_CENTRAL

## 2013-02-17 MED ORDER — LIDOCAINE HCL (PF) 1 % IJ SOLN: 5.0000 mL | INTRAMUSCULAR | Status: DC | PRN

## 2013-02-17 MED ORDER — MAGNESIUM SULFATE 40 MG/ML IJ SOLN: 4.0000 g | Freq: Once | INTRAMUSCULAR | Status: DC

## 2013-02-17 MED ORDER — SODIUM CHLORIDE 0.9 % IV SOLN: 100.0000 mL | INTRAVENOUS | Status: DC | PRN

## 2013-02-17 MED ORDER — NEPRO/CARBSTEADY PO LIQD
237.0000 mL | ORAL | Status: DC | PRN
  Filled 2013-02-17: qty 237

## 2013-02-17 MED ORDER — PENTAFLUOROPROP-TETRAFLUOROETH EX AERO: 1.0000 "application " | INHALATION_SPRAY | CUTANEOUS | Status: DC | PRN

## 2013-02-17 MED ORDER — LIDOCAINE-PRILOCAINE 2.5-2.5 % EX CREA
1.0000 "application " | TOPICAL_CREAM | CUTANEOUS | Status: DC | PRN
  Filled 2013-02-17: qty 5

## 2013-02-17 MED ORDER — MAGNESIUM SULFATE 40 MG/ML IJ SOLN
4.0000 g | Freq: Once | INTRAMUSCULAR | Status: AC
  Administered 2013-02-17: 4 g via INTRAVENOUS
  Filled 2013-02-17: qty 100

## 2013-02-17 NOTE — Progress Notes (Signed)
PT Cancellation Note  Patient Details Name: JOSLYNNE KLATT MRN: [id number] DOB: 05-Dec-1950   Cancelled Treatment:    Reason Eval/Treat Not Completed: Fatigue/lethargy limiting ability to participate. Pt complaining of being too dizzy to do anything today and needing to rest. Will try to check back later if schedule allows. Patient educated on benefits of getting OOB with nursing staff daily to prevent deconditioning.   [facility] HELEN 02/17/2013, 10:04 AM Pager: [phone]

## 2013-02-17 NOTE — Progress Notes (Signed)
I examined this patient and discussed the care plan with Dr Benjamin Stain, Dr Caryn Section and the [facility] team and agree with assessment and plan as documented in the progress note above.

## 2013-02-17 NOTE — Progress Notes (Addendum)
Family Medicine Teaching Service Daily Progress Note Service Page: [phone]  Subjective:  Pt complains of dizziness this morning/lightheadedness. Still vomited this morning at 6AM, but ate 1/2 apple sauce afterward and kept it down.   Medications - Received PRN phenergan at 6 pm  Objective: BP 150-160s/80s Pulse 49  Temp(Src) 97.5 F (36.4 C) (Oral)  Resp 14  Ht 5\' 5"  (1.651 m)  Wt 195 lb 15.8 oz (88.9 kg)  BMI 32.61 kg/m2  SpO2 97% on 2L O2  Intake/Output Summary (Last 24 hours) at 02/16/13 0950   Gross per 24 hour  Intake   3290  Output   1500      Exam: General: resting comfortably in bed, NAD, conversant, seems irritable Cardiovascular: RRR. No murmurs, rubs, or gallops. Distant. Respiratory: Bilateral faint crackles, No wheezing appreciated. Normal effort. Abdomen: obese, soft, nondistended, NT , NABS. Extremities: Trace lower extremity edema; tenderness to shin palpation; no size difference; SCD on. Neuro: No focal deficits. Normal speech, seems hard of hearing; alert and oriented x 3 with normal registration and recall, EOMI BACK: Bilateral lower back tenderness possible CVA, Left>Right  Labs:  CBC    Component Value Date/Time   WBC 13.1* 02/17/2013 0404   RBC 2.69* 02/17/2013 0404   HGB 8.0* 02/17/2013 0404   HCT 24.4* 02/17/2013 0404   PLT 302 02/17/2013 0404   MCV 90.7 02/17/2013 0404   MCH 29.7 02/17/2013 0404   MCHC 32.8 02/17/2013 0404   RDW 15.0 02/17/2013 0404   LYMPHSABS 0.3* 03/10/2011 1431   MONOABS 0.7 03/10/2011 1431   EOSABS 0.0 03/10/2011 1431   BASOSABS 0.0 03/10/2011 1431    BMET    Component Value Date/Time   NA 134* 02/17/2013 0454   K 3.7 02/17/2013 0454   CL 89* 02/17/2013 0454   CO2 30 02/17/2013 0454   GLUCOSE 106* 02/17/2013 0454   BUN 79* 02/17/2013 0454   CREATININE 7.77* 02/17/2013 0454   CREATININE 0.93 09/14/2011 1615   CALCIUM 6.0* 02/17/2013 0454   GFRNONAA 5* 02/17/2013 0454   GFRAA 6* 02/17/2013 0454   Calcium corrected to 7.3 today <--7.3 yest Mg  1.6 yest  Imaging/Diagnostic Tests:  Dg Chest Portable 1 View 02/13/2013  *RADIOLOGY REPORT*  Clinical Data: Nausea and vomiting.  Hypotension.  PORTABLE CHEST - 1 VIEW  Comparison: 03/15/2011  Findings: Shallow inspiration.  Borderline heart size and pulmonary vascularity are probably normal for technique.  No focal consolidation or airspace disease in the lungs.  No blunting of costophrenic angles.  No pneumothorax.  Mediastinal contours appear intact.  IMPRESSION: Shallow inspiration.  No evidence of active pulmonary disease.  Renal US 4/1: IMPRESSION:  Post right nephrectomy.  Fullness of collecting system at inferior pole of left kidney  without gross pelviectasis or evidence of obstruction.  FOBT neg x 2 EKG yesterday 4 pm no STEMI, wnl  CT abd/pelvis: IMPRESSION:  1. Previous right nephrectomy. No evidence of urolithiasis or  hydronephrosis.  2. Sigmoid diverticulosis. No radiographic evidence of  diverticulitis.  3. Tiny less than 1 cm calcified uterine fibroid.  4. Small bilateral pleural effusions, and bibasilar interstitial  infiltrates suspicious for pulmonary edema.  Chest x-ray 4/3: IMPRESSION:  1. Findings, as above, suggestive of mild to moderate congestive  heart failure.  Assessment/Plan: CHRISTOL THETFORD is a 62 y.o. year old female presenting with PMH of DM-2, muscle spasms, and emphysematous pylenephritis s/p right nephrectomy who presents with nausea/vomiting for approximately 2 weeks. On admission found to have acute kidney  injury with associated metabolic acidosis and hyperkalemia, as well as intractable n/v possibly secondary to azotemia.   # AKI - Thought due most likely to nausea/vomiting preceding and during this admission leading to dehydration. Renal following and we greatly appreciate their recommendations. Renal US with full-appearing kidney with no sign of obstruction. Contulted urology due to h/o previous emphysematous pyelonephritis, saw pt and signed  off with no further rec or concern for obstruction. - UTI likely contributing, e coli pan-sensitive; continue ctx - BUN/Creatinine elevated at 103/8.7200>7.7 today, stable - Discontinued IV fluids yesterday with signs of fluid overload (see below) - Continue calcium carbonate for low calcium - Per renal recommendations, started HD today given fluid overload seen on chest x-ray; greatly appreciate further recs - Review records to understand better pt's baseline Cr - SUpplemental O2 prn  # Hyperkalemia with mild T wave peaking on EKG, s/p kayexilate with resultant hypokalemia, dosed IV K x 1, resolved; normal Mg  # Metabolic acidosis - Thought to be likely from diarrhea. Corrected after IV fluids with bicarbonate  # UTI - Urine Cx resulted >100,000 cfu e coli, pan-sensitive; continue CTX  # Nausea/vomiting/diarrhea - Thought to be cause of AKI. Unclear etiology, as the long duration of symptoms makes gastroenteritis less likely; no elevated lactic acid; now possibly also due to azotemia - Protonix started yesterday - Discontinued reglan due to dizziness this AM - Ordered h pylori antibody - Will order GI consult and test c difficile if no improvement - Zofran PRN nausea   # DM-2 - A1c 6.1 - CBG's TID AC and QHS  - Sensitive SSI   # Anemia - Iron panel with sat 23%, ferritin 495, folate 8.4; IV Fe per renal, FOBT neg x 2 - IV iron started - Will monitor closely during admission   # Muscle spasms of back  - Discontinued flexeril (pt has not received in >24 hours) due to anticholinergic effects - Tylenol PRN   # Hypertension - BP 173/71 today; likely 2/2 fluid overload - Hemodialysis today - Continue to monitor  # Diverticulosis - No evidence of diverticulitis on CT. If develops fever or other signs of infection, consider starting antibiotics for diverticulitis  FEN/GI: KVO; renal diet Prophylaxis: Discontinued heparin given low hgb and azotemia; SCDs  Disposition: Pending  clinical improvement in nausea and vomiting and further renal management for AKI Code Status: Full code.   Simone Curia, MD 02/16/2013, 9:50 AM

## 2013-02-17 NOTE — Procedures (Signed)
[facility]  On HD via R femoral vein cath BP--126/63 Goal--3.5 L Hgb--8   K+--3.7

## 2013-02-17 NOTE — Progress Notes (Signed)
OT Cancellation Note  Patient Details Name: [name] [name] MRN: [id number] DOB: 1951/05/19   Cancelled Treatment:    Reason Eval/Treat Not Completed: Patient at procedure or test/ unavailable (@ HD at this time)  Lucile Shutters 02/17/2013, 12:06 PM Pager: [phone]

## 2013-02-17 NOTE — Procedures (Signed)
 [facility]  DIALYSIS CATHETER INSERTION NOTE [name] [name] [id number] June 18, 1951  3 lumen trialysis catheter placed in R femoral vein after lidocaine local anesthesia. Saline placed in both ends of cath.  Pt tolerated procedure well.  [name] F 02/17/2013, 12:19 PM

## 2013-02-17 NOTE — Care Management Note (Signed)
    Page 1 of 2   02/23/2013     3:08:57 PM   CARE MANAGEMENT NOTE 02/23/2013  Patient:  [name] [name], [name] [name]   Account Number:  [id number]  Date Initiated:  02/17/2013  Documentation initiated by:  Donn Pierini  Subjective/Objective Assessment:   Pt admitted with ARF     Action/Plan:   PTA pt lived at home with spouse   Anticipated DC Date:  02/27/2013   Anticipated DC Plan:  HOME W HOME HEALTH SERVICES      DC Planning Services  CM consult      Choice offered to / List presented to:             Status of service:  In process, will continue to follow Medicare Important Message given?   (If response is "NO", the following Medicare IM given date fields will be blank) Date Medicare IM given:   Date Additional Medicare IM given:    Discharge Disposition:    Per UR Regulation:  Reviewed for med. necessity/level of care/duration of stay  If discussed at Long Length of Stay Meetings, dates discussed:    Comments:  02/23/13- 1200- Donn Pierini RN, BSN [phone] Received referral regarding pt's HD situation- pt is self pay- has no insurance- and as per renal note- "Cannot be discharged until clearly recovers or not (has no means of payment for outpt HD at present even if we declared her ESRD which we cannot do at this stage as she is "medicaid pending") So will have to remain in house for observation and prn HD until course declares itself"- spoke with resident regarding this and explained that pt has no other option but to remain in hospital   02/21/13- 1515- Donn Pierini RN, BSN [phone] Pt went into afib today now on cardizem gtt- plan for HD again today then possible removal of fem. cath to allow pt to get up- PT/OT still recommending HH at discharge- NCM to cont. to follow for d/c planning  02/17/13- 1125- Donn Pierini RN, BSN [phone] Plan today to insert fem vein HD cath  and begin HD tx- per PT evals recommendations at this time for [facility]- RNCM will follow for d/c  needs/planning as pt progresses.

## 2013-02-17 NOTE — Progress Notes (Signed)
[name]  Subjective:  "I feel dizzy and light headed" (? From reglan).  Vomited last night and this morning   Objective: Vital signs in last 24 hours: Blood pressure 135/67, pulse 58, temperature 98.4 F (36.9 C), temperature source Oral, resp. rate 15, height 5\' 5"  (1.651 m), weight 87.9 kg (193 lb 12.6 oz), SpO2 99.00%.    PHYSICAL EXAM General--as above Chest--exp wheezes and crackles Heart--no rub Abd--nontender Extr--trace edema  Lab Results:   Recent Labs Lab 02/15/13 0430 02/16/13 0502 02/17/13 0454  NA 135 137 134*  K 4.0 3.1* 3.7  CL 98 92* 89*  CO2 19 29 30   BUN 93* 85* 79*  CREATININE 7.54* 7.54* 7.77*  GLUCOSE 127* 162* 106*  CALCIUM 6.1* 5.9* 6.0*  PHOS 5.1* 5.5* 6.2*     Recent Labs  02/16/13 0502 02/17/13 0404  WBC 11.2* 13.1*  HGB 7.8* 8.0*  HCT 23.5* 24.4*  PLT 308 302   CXR--pulm vascular congestion plus small B pl effusions  I have reviewed the patient's current medications. Scheduled: . aspirin EC  81 mg Oral Daily  . calcium carbonate  1 tablet Oral TID WC  . cefTRIAXone (ROCEPHIN)  IV  1 g Intravenous Q24H  . darbepoetin (ARANESP) injection - NON-DIALYSIS  100 mcg Subcutaneous Q Wed-1800  . insulin aspart  0-9 Units Subcutaneous TID WC  . metoCLOPramide (REGLAN) injection  5 mg Intravenous Q6H  . pantoprazole  40 mg Oral Daily  . sodium chloride  3 mL Intravenous Q12H   Continuous: . sodium chloride 10 mL/hr (02/16/13 1921)    Assessment/Plan: 1. Acute Kidney Injury-- past hx R nephrectomy for emphysematous pyelo--GU not suspicious of anything acc to Dr. McDiarmid's note yesterday.  IVF have not reversed potential prerenal problem and now CXR looks wet.  Will plan dialysis today (I'll insert fem vein HD cath on 6700). IV now at [facility].   Pt understands and agrees 2.  Hyperkalemia-- K 3.7 today 3.   Metabolic Acidosis - acidosis corrected bicarb 30 today. 4.   Anemia-- FOBT neg. Fe panel sat 23%, ferritin 495,  folate 8.4. On IV Fe and Aranesp. Transfuse for hgb <7.  Hgb 8 today  5.   HTN/Vol -- BP ok today.  Urine output 2350 past 24 hr.    6.   Vomiting-- this has persisted since admission. Rec H2 blocker and if no better ask GI to see .  I'd suggest d/c reglan (I didn't recommend it at all) 7.   UTI---E coli, sens pending, on ceftriaxone.  8.   Hypocalcemia--  Mg 1.6 yesterday, alb 2.4.  Corrected Ca 7.2.  Will give MgSO4 IV 32 mEq over 24 hr.      LOS: 4 days   [name] [name] F 02/17/2013,7:59 AM   .labalb

## 2013-02-17 NOTE — Progress Notes (Signed)
CRITICAL VALUE ALERT  Critical value received:  Calcium 6.0  Date of notification:  02/17/13  Time of notification:  0600  Critical value read back:yes  Nurse who received alert:  Leta Baptist RN  MD notified (1st page):  Gwendolyn Grant  Time of first page:  0650  MD notified (2nd page):  Time of second page:  Responding MD:    Time MD responded:  [phone]  No new orders at this time.

## 2013-02-18 LAB — RENAL FUNCTION PANEL
CO2: 26 mEq/L (ref 19–32)
Calcium: 7.1 mg/dL — ABNORMAL LOW (ref 8.4–10.5)
GFR calc Af Amer: 8 mL/min — ABNORMAL LOW (ref >90)
GFR calc non Af Amer: 6 mL/min — ABNORMAL LOW (ref >90)
Phosphorus: 5.3 mg/dL — ABNORMAL HIGH (ref 2.3–4.6)
Potassium: 4.1 mEq/L (ref 3.5–5.1)
Sodium: 133 mEq/L — ABNORMAL LOW (ref 135–145)

## 2013-02-18 LAB — GLUCOSE, CAPILLARY
Glucose-Capillary: 96 mg/dL (ref 70–99)
Glucose-Capillary: 148 mg/dL — ABNORMAL HIGH (ref 70–99)

## 2013-02-18 LAB — URINALYSIS, ROUTINE W REFLEX MICROSCOPIC
Bilirubin Urine: NEGATIVE
Glucose, UA: 100 mg/dL — AB
Ketones, ur: NEGATIVE mg/dL
pH: 7.5 (ref 5.0–8.0)

## 2013-02-18 LAB — URINE MICROSCOPIC-ADD ON

## 2013-02-18 LAB — CBC
MCHC: 31.5 g/dL (ref 30.0–36.0)
RDW: 14.6 % (ref 11.5–15.5)
WBC: 13.9 10*3/uL — ABNORMAL HIGH (ref 4.0–10.5)

## 2013-02-18 MED ORDER — POTASSIUM CHLORIDE CRYS ER 20 MEQ PO TBCR
40.0000 meq | EXTENDED_RELEASE_TABLET | Freq: Once | ORAL | Status: AC
  Administered 2013-02-18: 40 meq via ORAL
  Filled 2013-02-18 (×2): qty 1

## 2013-02-18 MED ORDER — NEPRO/CARBSTEADY PO LIQD
237.0000 mL | ORAL | Status: DC | PRN
  Filled 2013-02-18: qty 237

## 2013-02-18 MED ORDER — SODIUM CHLORIDE 0.9 % IV SOLN: 100.0000 mL | INTRAVENOUS | Status: DC | PRN

## 2013-02-18 MED ORDER — LIDOCAINE HCL (PF) 1 % IJ SOLN: 5.0000 mL | INTRAMUSCULAR | Status: DC | PRN

## 2013-02-18 MED ORDER — HEPARIN SODIUM (PORCINE) 1000 UNIT/ML DIALYSIS
20.0000 [IU]/kg | INTRAMUSCULAR | Status: DC | PRN
  Filled 2013-02-18: qty 2

## 2013-02-18 MED ORDER — LIDOCAINE-PRILOCAINE 2.5-2.5 % EX CREA: 1.0000 "application " | TOPICAL_CREAM | CUTANEOUS | Status: DC | PRN

## 2013-02-18 MED ORDER — HEPARIN SODIUM (PORCINE) 1000 UNIT/ML DIALYSIS
1000.0000 [IU] | INTRAMUSCULAR | Status: DC | PRN
  Filled 2013-02-18: qty 1

## 2013-02-18 MED ORDER — PENTAFLUOROPROP-TETRAFLUOROETH EX AERO: 1.0000 "application " | INHALATION_SPRAY | CUTANEOUS | Status: DC | PRN

## 2013-02-18 MED ORDER — ALTEPLASE 2 MG IJ SOLR: 2.0000 mg | Freq: Once | INTRAMUSCULAR | Status: AC | PRN

## 2013-02-18 MED ORDER — SODIUM CHLORIDE 0.9 % IJ SOLN
INTRAMUSCULAR | Status: AC
  Administered 2013-02-18: 08:00:00
  Filled 2013-02-18: qty 10

## 2013-02-18 MED ORDER — HEPARIN SOD (PORK) LOCK FLUSH 100 UNIT/ML IV SOLN: 2800.0000 [IU] | Freq: Once | INTRAVENOUS | Status: DC

## 2013-02-18 NOTE — Progress Notes (Signed)
I examined this patient and discussed the care plan with Dr Tye Savoy and the [facility] team and agree with assessment and plan as documented in the progress note above. Hopefully her nausea will improve after further dialysis.

## 2013-02-18 NOTE — Procedures (Signed)
[facility]  On HD via R fem. HD cath BP--189/87 Goal--3 L Hgb--8.7   K+--4.1

## 2013-02-18 NOTE — Progress Notes (Signed)
[facility]  Subjective:  Dialyzed yesterday, awake, breathing easier, still vomiting   Objective: Vital signs in last 24 hours: Blood pressure 161/137, pulse 74, temperature 98.2 F (36.8 C), temperature source Axillary, resp. rate 23, height 5\' 5"  (1.651 m), weight 86.6 kg (190 lb 14.7 oz), SpO2 98.00%.    PHYSICAL EXAM General--as above  Chest--exp wheezes and crackles  Heart--no rub  Abd--nontender  Extr--trace edema, HD cath in R femoral vein   Lab Results:   Recent Labs Lab 02/16/13 0502 02/17/13 0454 02/18/13 0445  NA 137 134* 133*  K 3.1* 3.7 4.1  CL 92* 89* 92*  CO2 29 30 26   BUN 85* 79* 57*  CREATININE 7.54* 7.77* 6.19*  GLUCOSE 162* 106* 102*  CALCIUM 5.9* 6.0* 7.1*  PHOS 5.5* 6.2* 5.3*     Recent Labs  02/17/13 0404 02/18/13 0439  WBC 13.1* 13.9*  HGB 8.0* 8.7*  HCT 24.4* 27.6*  PLT 302 325     I have reviewed the patient's current medications. Scheduled: . aspirin EC  81 mg Oral Daily  . calcium carbonate  1 tablet Oral TID WC  . cefTRIAXone (ROCEPHIN)  IV  1 g Intravenous Q24H  . darbepoetin (ARANESP) injection - NON-DIALYSIS  100 mcg Subcutaneous Q Wed-1800  . insulin aspart  0-9 Units Subcutaneous TID WC  . pantoprazole  40 mg Oral Daily  . potassium chloride  40 mEq Oral Once  . sodium chloride  3 mL Intravenous Q12H    Assessment/Plan:  1.  Acute Kidney Injury-- past hx R nephrectomy for emphysematous pyelo--GU not suspicious of anything acc to Dr. McDiarmid's note. IVF have not reversed potential prerenal problem. CXR looked wet on 3 Apr.. Will plan dialysis again today.  Vomiting no better with dialysis 2. Hyperkalemia-- K 4.1 today  3. Metabolic Acidosis - acidosis corrected bicarb 26 today.  4. Anemia-- FOBT neg. Fe panel sat 23%, ferritin 495, folate 8.4. On IV Fe and Aranesp. Transfuse for hgb <7. Hgb 8.7 today  5. HTN/Vol -- BP ok today. Urine output 950 cc past 24 hr.  6. Vomiting-- this has persisted since  admission. On protonix.  I'd suggest GI see 7. UTI---E coli, sens pending, on ceftriaxone. UA today--TNTC WBC, 11-20 RBC.  Will send ANCA, anti GBM, ANA in case acute GN causing elevated Cr (I doubt this).  Urine EOS pending.   8. Hypocalcemia--Ca 7.1 today --corrected  Ca 8.6 (got MgSO4 yesterday).    LOS: 5 days   Peterson Mathey F 02/18/2013,11:01 AM   .labalb

## 2013-02-18 NOTE — Progress Notes (Signed)
Family Medicine Teaching Service Daily Progress Note Service Page: [phone]  Subjective: Patient vomited again this morning.  Nurse says she was able to eat more after dialysis yesterday, and patient can keep fluids down.  Overall, patient says she is feeling better.  She complains of RT flank pain that started yesterday, which she thinks are muscle spasms.  Denies any abdominal or pelvic pain at this time.  Objective: Temp:  [97.5 F (36.4 C)-98.6 F (37 C)] 98.6 F (37 C) (04/05 0348) Pulse Rate:  [49-64] 59 (04/05 0349) Resp:  [10-18] 17 (04/05 0349) BP: (137-171)/(61-83) 157/69 mmHg (04/05 0349) SpO2:  [90 %-99 %] 97 % (04/05 0349) Weight:  [190 lb 14.7 oz (86.6 kg)-195 lb 15.8 oz (88.9 kg)] 190 lb 14.7 oz (86.6 kg) (04/04 1604)  Exam: General: resting comfortably in bed, NAD Cardiovascular: RRR. No murmurs, rubs, or gallops. Distant.  Respiratory: CTAB. No wheezing appreciated. Normal effort.  Abdomen: obese, soft, nondistended, NT , NABS.  Extremities: SCD in place bilaterally, no edema, clubbing, or cyanosis  BACK: Mild tenderness on palpation of midline lumbar spine and RT flank.  I have reviewed the patient's medications, labs, imaging, and diagnostic testing.  Notable results are summarized below.  CBC BMET   Recent Labs Lab 02/16/13 0502 02/17/13 0404 02/18/13 0439  WBC 11.2* 13.1* 13.9*  HGB 7.8* 8.0* 8.7*  HCT 23.5* 24.4* 27.6*  PLT 308 302 325    Recent Labs Lab 02/16/13 0502 02/17/13 0454 02/18/13 0445  NA 137 134* 133*  K 3.1* 3.7 4.1  CL 92* 89* 92*  CO2 29 30 26   BUN 85* 79* 57*  CREATININE 7.54* 7.77* 6.19*  GLUCOSE 162* 106* 102*  CALCIUM 5.9* 6.0* 7.1*     Imaging/Diagnostic Tests:  Renal US 4/1: IMPRESSION:  Post right nephrectomy.  Fullness of collecting system at inferior pole of left kidney  without gross pelviectasis or evidence of obstruction.  FOBT neg x 2  CT abd/pelvis: IMPRESSION:  1. Previous right nephrectomy. No  evidence of urolithiasis or  hydronephrosis.  2. Sigmoid diverticulosis. No radiographic evidence of  diverticulitis.  3. Tiny less than 1 cm calcified uterine fibroid.  4. Small bilateral pleural effusions, and bibasilar interstitial  infiltrates suspicious for pulmonary edema.  Chest x-ray 4/3: IMPRESSION:  1. Findings, as above, suggestive of mild to moderate congestive  heart failure.  Plan: [name] [name] is a 62 y.o. female presenting with PMH of DM-2, muscle spasms, and emphysematous pylenephritis s/p right nephrectomy who presents with nausea/vomiting for approximately 2 weeks. On admission found to have acute kidney injury with associated metabolic acidosis and hyperkalemia, as well as intractable n/v possibly secondary to azotemia.   # AKI - Thought due most likely to nausea/vomiting preceding and during this admission leading to dehydration. Renal following and we greatly appreciate their recommendations. Renal US with full-appearing kidney with no sign of obstruction. Contulted urology due to h/o previous emphysematous pyelonephritis, saw pt and signed off with no further rec or concern for obstruction.  - UTI likely contributing, e coli pan-sensitive; continue CTX - Creatinine down to 6.19 today status post HD yesterday - Continue calcium carbonate for low calcium   # Hyperkalemia with mild T wave peaking on EKG, s/p kayexalate with resultant hypokalemia, dosed IV K x 1, resolved; normal Mg. # Metabolic acidosis - Thought to be likely from diarrhea. Corrected after IV fluids with bicarbonate   # UTI - Urine Cx resulted >100,000 cfu e coli, pan-sensitive; continue CTX   #  Nausea/vomiting/diarrhea - Thought to be cause of AKI. Unclear etiology, as the long duration of symptoms makes gastroenteritis less likely; no elevated lactic acid; now possibly also due to azotemia. - Protonix started yesterday  - Ordered h pylori antibody - in process - Will order GI consult and test c  difficile if no improvement  - Zofran PRN nausea   # DM-2 - A1c 6.1  - CBG's TID AC and QHS  - Sensitive SSI   # Anemia - Iron panel with sat 23%, ferritin 495, folate 8.4; IV Fe per renal, FOBT neg x 2  - IV iron started  - Will monitor closely during admission   # Muscle spasms of back  - Discontinued flexeril (pt has not received in >24 hours) due to anticholinergic effects  - Tylenol PRN   # Hypertension - BP 173/71 today; likely 2/2 fluid overload  - Hemodialysis today  - Continue to monitor   # Diverticulosis - No evidence of diverticulitis on CT. If develops fever or other signs of infection, consider starting antibiotics for diverticulitis   FEN/GI: KVO; renal diet  Prophylaxis: Discontinued heparin given low hgb and azotemia; SCDs  Disposition: Pending clinical improvement in nausea and vomiting and further renal management for AKI  Code Status: Full code  DE LA CRUZ,Marylynne Keelin, DO 02/18/2013, 8:30 AM

## 2013-02-19 LAB — GLUCOSE, CAPILLARY
Glucose-Capillary: 123 mg/dL — ABNORMAL HIGH (ref 70–99)
Glucose-Capillary: 143 mg/dL — ABNORMAL HIGH (ref 70–99)

## 2013-02-19 LAB — COMPREHENSIVE METABOLIC PANEL
ALT: <5 U/L (ref 0–35)
Alkaline Phosphatase: 116 U/L (ref 39–117)
BUN: 42 mg/dL — ABNORMAL HIGH (ref 6–23)
CO2: 26 mEq/L (ref 19–32)
Calcium: 8.2 mg/dL — ABNORMAL LOW (ref 8.4–10.5)
GFR calc Af Amer: 9 mL/min — ABNORMAL LOW (ref >90)
GFR calc non Af Amer: 8 mL/min — ABNORMAL LOW (ref >90)
Glucose, Bld: 126 mg/dL — ABNORMAL HIGH (ref 70–99)
Potassium: 4.8 mEq/L (ref 3.5–5.1)
Sodium: 133 mEq/L — ABNORMAL LOW (ref 135–145)

## 2013-02-19 LAB — RENAL FUNCTION PANEL
Albumin: 2.6 g/dL — ABNORMAL LOW (ref 3.5–5.2)
BUN: 41 mg/dL — ABNORMAL HIGH (ref 6–23)
Calcium: 8.2 mg/dL — ABNORMAL LOW (ref 8.4–10.5)
Chloride: 93 mEq/L — ABNORMAL LOW (ref 96–112)
Creatinine, Ser: 5.24 mg/dL — ABNORMAL HIGH (ref 0.50–1.10)

## 2013-02-19 LAB — CBC
HCT: 28.3 % — ABNORMAL LOW (ref 36.0–46.0)
Hemoglobin: 8.9 g/dL — ABNORMAL LOW (ref 12.0–15.0)
MCHC: 31.4 g/dL (ref 30.0–36.0)
RBC: 3.06 MIL/uL — ABNORMAL LOW (ref 3.87–5.11)
RDW: 14.3 % (ref 11.5–15.5)

## 2013-02-19 MED ORDER — TRAZODONE 25 MG HALF TABLET
25.0000 mg | ORAL_TABLET | Freq: Every evening | ORAL | Status: DC | PRN
  Filled 2013-02-19: qty 1

## 2013-02-19 MED ORDER — DIPHENHYDRAMINE HCL 25 MG PO CAPS
25.0000 mg | ORAL_CAPSULE | Freq: Four times a day (QID) | ORAL | Status: DC | PRN
  Administered 2013-02-21 (×2): 25 mg via ORAL
  Filled 2013-02-19 (×4): qty 1

## 2013-02-19 MED ORDER — WHITE PETROLATUM GEL
Status: AC
  Administered 2013-02-19: 11:00:00
  Filled 2013-02-19: qty 5

## 2013-02-19 MED ORDER — ZINC OXIDE 12.8 % EX OINT
TOPICAL_OINTMENT | CUTANEOUS | Status: DC | PRN
  Filled 2013-02-19: qty 56.7

## 2013-02-19 NOTE — Consult Note (Signed)
[facility] Consultation Note  Referring Provider: Payton Mccallum, MD [facility] Teaching service) Primary Care Physician:  [facility], Marylene Land, MD  Reason for Consultation:  Nausea, vomiting  HPI: [name] [name] is a 62 y.o. female admitted for nausea and vomiting with acute renal failure.  She has required dialysis, but her nausea and vomiting has persisted despite improvement in her BUN.  She has had persistent nausea and vomiting for the past several days.  She describes intermittent vomiting of food almost immediately after she eats.  Some early satiety and sensation of poor food digestion as well.  Vague solid and liquid dysphagia.  Chronic constipation with bloating, straining.  No blood in stool or hematemesis.  Patient tells me she has had intermittent troubles with nausea and vomiting, followed by significant symptom-free intervals, for the past several years.  Patient denies NSAIDs.  She tells me she had an endoscopy over 10 years ago, during which time an ulcer was seen.   She also describes having what might have been a colonoscopy several years ago as well.   Past Medical History  Diagnosis Date  . Hearing difficulty     Mild   . Diabetes mellitus without complication   . Night muscle spasms   . Bursitis   . Fatigue     Past Surgical History  Procedure Laterality Date  . 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 Authorizing Provider  insulin glargine (LANTUS) 100 UNIT/ML injection Inject 20 Units into the skin daily.   Yes Historical Provider, MD  pseudoephedrine-acetaminophen (TYLENOL SINUS) 30-500 MG TABS Take 1 tablet by mouth every 4 (four) hours as needed (for sinus drainage).   Yes Historical Provider, MD    Current Facility-Administered Medications  Medication Dose Route Frequency Provider Last Rate Last Dose  . 0.9 %  sodium chloride infusion  100  mL Intravenous PRN Zada Girt, MD      . 0.9 %  sodium chloride infusion  100 mL Intravenous PRN Zada Girt, MD      . acetaminophen (TYLENOL) tablet 650 mg  650 mg Oral Q6H PRN Tommie Sams, DO   650 mg at 02/18/13 0900   Or  . acetaminophen (TYLENOL) suppository 650 mg  650 mg Rectal Q6H PRN Tommie Sams, DO      . aspirin EC tablet 81 mg  81 mg Oral Daily Tommie Sams, DO   81 mg at 02/19/13 1610  . calcium carbonate (OS-CAL - dosed in mg of elemental calcium) tablet 500 mg of elemental calcium  1 tablet Oral TID WC Lonia Skinner, MD   500 mg of elemental calcium at 02/19/13 1056  . cefTRIAXone (ROCEPHIN) 1 g in dextrose 5 % 50 mL IVPB  1 g Intravenous Q24H Tommie Sams, DO   1 g at 02/19/13 0148  . darbepoetin (ARANESP) injection 100 mcg  100 mcg Subcutaneous Q Wed-1800 Zada Girt, MD   100 mcg at 02/15/13 1747  . feeding supplement (NEPRO CARB STEADY) liquid 237 mL  237 mL Oral PRN Zada Girt, MD      . heparin injection 1,000 Units  1,000 Units Dialysis PRN Zada Girt, MD      . heparin injection 1,700 Units  20 Units/kg Dialysis PRN Zada Girt, MD      . heparin lock flush 100 unit/mL  2,800 Units Intravenous Once Zada Girt, MD      . insulin aspart (novoLOG) injection 0-9 Units  0-9 Units Subcutaneous TID WC Tommie Sams, DO   1 Units at 02/18/13 1231  . lidocaine (PF) (XYLOCAINE) 1 % injection 5 mL  5 mL Intradermal PRN Zada Girt, MD      . lidocaine-prilocaine (EMLA) cream 1 application  1 application Topical PRN Zada Girt, MD      . ondansetron [facility]) tablet 4 mg  4 mg Oral Q6H PRN Tommie Sams, DO   4 mg at 02/19/13 0842   Or  . ondansetron (ZOFRAN) injection 4 mg  4 mg Intravenous Q6H PRN Tommie Sams, DO   4 mg at 02/18/13 1045  . pantoprazole (PROTONIX) EC tablet 40 mg  40 mg Oral Daily Lonia Skinner, MD   40 mg at 02/19/13 0918  . pentafluoroprop-tetrafluoroeth (GEBAUERS) aerosol 1 application  1 application Topical PRN Zada Girt, MD       . promethazine (PHENERGAN) injection 12.5 mg  12.5 mg Intravenous Q6H PRN Martina Sinner, MD   12.5 mg at 02/18/13 0738  . sodium chloride 0.9 % injection 3 mL  3 mL Intravenous Q12H Tommie Sams, DO   3 mL at 02/19/13 0918  . traZODone (DESYREL) tablet 25 mg  25 mg Oral QHS PRN Shelly Rubenstein, MD        Allergies as of 02/13/2013 - Review Complete 02/13/2013  Allergen Reaction Noted  . Aleve (naproxen sodium)  02/13/2013  . Motrin (ibuprofen)  02/13/2013    Family History  Problem Relation Age of Onset  . Heart disease    . Diabetes    . Stroke    . Bone cancer Father     Of jaws   . Throat cancer Father     History   Social History  . Marital Status: Married    Spouse Name: N/A    Number of Children: N/A  . Years of Education: N/A   Occupational History  . Not on file.   Social History Main Topics  . Smoking status: Never Smoker   . Smokeless tobacco: Not on file  . Alcohol Use: No  . Drug Use: No  . Sexually Active: Not on file   Other Topics Concern  . Not on file   Social History Narrative   Lives with husband [name] [name] (born [birth year]).    Denies alcohol, tobacco (never smoked), other drugs   Hobbies: reading          Review of Systems: ROS Dr. Lula Olszewski 02/13/13 reviewed and I agree  Physical Exam: Vital signs in last 24 hours: Temp:  [98.3 F (36.8 C)-98.6 F (37 C)] 98.6 F (37 C) (04/06 1104) Pulse Rate:  [59-73] 73 (04/06 1104) Resp:  [12-20] 15 (04/06 1104) BP: (78-181)/(34-113) 168/80 mmHg (04/06 1104) SpO2:  [91 %-100 %] 98 % (04/06 1104) Weight:  [83.7 kg (184 lb 8.4 oz)-85.3 kg (188 lb 0.8 oz)] 83.7 kg (184 lb 8.4 oz) (04/05 2135) Last BM Date: 02/16/13 General:   Alert,  Well-developed, well-nourished, pleasant and cooperative in NAD Head:  Normocephalic and atraumatic. Eyes:  Sclera clear, no icterus.   Conjunctiva pink. Ears:  Some hearing difficulty R > L Nose:  No deformity, discharge,  or lesions. Mouth:  Fair  dentition; No deformity or lesions.  Oropharynx pink, mucous membranes a bit dry. Neck:  Supple; no masses or thyromegaly.  Lungs:  Clear throughout to auscultation.   No wheezes, crackles, or rhonchi. No acute distress. Heart:  Regular rate and rhythm; no murmurs, clicks, rubs,  or gallops. Abdomen:  Soft, mild generalized (worse in periumbilical region) abdominal tenderness No masses, hepatosplenomegaly or hernias noted. Normal bowel sounds, without guarding, and without rebound.     Msk:  Symmetrical without gross deformities. Normal posture. Pulses:  Normal pulses noted. Extremities:  Without clubbing or edema. Neurologic:  Alert and  oriented x4;  Diffusely weak, otherwise grossly normal neurologically. Skin:  Intact without significant lesions or rashes. Psych:  Alert and cooperative. Normal mood and affect.   Lab Results:  Recent Labs  02/17/13 0404 02/18/13 0439 02/19/13 0747  WBC 13.1* 13.9* 11.9*  HGB 8.0* 8.7* 8.9*  HCT 24.4* 27.6* 28.3*  PLT 302 325 310   BMET  Recent Labs  02/17/13 0454 02/18/13 0445 02/19/13 0747  NA 134* 133* 134*  K 3.7 4.1 4.4  CL 89* 92* 93*  CO2 30 26 26   GLUCOSE 106* 102* 95  BUN 79* 57* 41*  CREATININE 7.77* 6.19* 5.24*  CALCIUM 6.0* 7.1* 8.2*   LFT  Recent Labs  02/19/13 0747  ALBUMIN 2.6*   PT/INR No results found for this basename: LABPROT, INR,  in the last 72 hours  Studies/Results: No results found.  Impression:  1.  Nausea and vomiting.  This has been an ongoing issue, albeit intermittent with acute flares as well as symptom-free intervals, for a number of years.  Suspect gastroparesis.  Uremia can certainly be playing a role, but her nausea and vomiting symptoms have persisted despite dialysis and interval decrease in BUN.  Plan:  1.  Pantoprazole. 2.  Check liver tests. 3.  Endoscopy tomorrow. 4.  If endoscopy is unrevealing, would consider gastric emptying study and possible abdominal ultrasound. 5.  Thank  you for the consult. Will follow.   LOS: 6 days   Hung Rhinesmith M  02/19/2013, 12:53 PM

## 2013-02-19 NOTE — Progress Notes (Signed)
[facility]  Subjective:  Awake, no SOB, vomited again this AM Dialyzed last night but treatment shortened due to low BP (lowest 83/34)   Objective: Vital signs in last 24 hours: Blood pressure 153/63, pulse 70, temperature 98.5 [name] (36.9 C), temperature source Oral, resp. rate 20, height 5\' 5"  (1.651 m), weight 83.7 kg (184 lb 8.4 oz), SpO2 94.00%.    PHYSICAL EXAM General--as above  Chest--exp wheezes--no crackles today Heart--no rub  Abd--nontender  Extr--no edema, HD cath in R femoral vein  Date   Weight 1 Apr  83.3  4 Apr  88.9 5 Apr  85.3 6 Apr  83.7  I/0 last 24 hr  13/1500 (nonoliguric)  Lab Results:   Recent Labs Lab 02/17/13 0454 02/18/13 0445 02/19/13 0747  NA 134* 133* 134*  K 3.7 4.1 4.4  CL 89* 92* 93*  CO2 30 26 26   BUN 79* 57* 41*  CREATININE 7.77* 6.19* 5.24*  GLUCOSE 106* 102* 95  CALCIUM 6.0* 7.1* 8.2*  PHOS 6.2* 5.3* 4.2     Recent Labs  02/18/13 0439 02/19/13 0747  WBC 13.9* 11.9*  HGB 8.7* 8.9*  HCT 27.6* 28.3*  PLT 325 310     I have reviewed the patient's current medications. Scheduled: . aspirin EC  81 mg Oral Daily  . calcium carbonate  1 tablet Oral TID WC  . cefTRIAXone (ROCEPHIN)  IV  1 g Intravenous Q24H  . darbepoetin (ARANESP) injection - NON-DIALYSIS  100 mcg Subcutaneous Q Wed-1800  . heparin lock flush  2,800 Units Intravenous Once  . insulin aspart  0-9 Units Subcutaneous TID WC  . pantoprazole  40 mg Oral Daily  . sodium chloride  3 mL Intravenous Q12H    Assessment/Plan: 1. Acute Kidney Injury-- past hx R nephrectomy for emphysematous pyelo--GU not suspicious of anything acc to Dr. McDiarmid's note. IVF have not reversed potential prerenal problem. CXR looked wet on 3 Apr.. Dialyzed on 4 and 5 Apr. Vomiting no better with dialysis.  Urine EOS--pending, ANA, ant-GBM, and ANCA pending.  HD cath still in R  Fem vein. 2. Hyperkalemia-- K 4.4 today  3. Metabolic Acidosis - acidosis corrected  bicarb 26 today.  4. Anemia-- FOBT neg. Fe panel sat 23%, ferritin 495, folate 8.4. On IV Fe and Aranesp. Transfuse for hgb <7. Hgb 8.9 today  5. HTN/Vol -- BP ok today. Urine output 1500 cc past 24 hr.  6. Vomiting-- this has persisted since admission. On protonix. I'd suggest GI see  7. UTI---E coli, sens pending, on ceftriaxone. UA yesterday--TNTC WBC, 11-20 RBC.  ANCA, anti GBM, ANA sent in case acute GN causing elevated Cr (I doubt this). Urine EOS pending.  8. Hypocalcemia--Ca 8.2 today --corrected Ca  9.4 (got MgSO4 4 Apr).     LOS: 6 days   [name] [name] 02/19/2013,10:08 AM   .labalb

## 2013-02-19 NOTE — Discharge Summary (Signed)
Discharge Summary 03/08/2013 10:38 PM  [name] [name] DOB: 1951-03-21 MRN: [id number]  Date of Admission: 02/13/2013 Date of Discharge: 03/08/2013  PCP: Karen Kays, MD Consultants: Nephrology, urology, GI, radiology   Reason for Admission: Intractable nausea and vomiting with acute kidney injury  Discharge Diagnosis Primary 1. Chronic renal failure Secondary 1. Anemia 2. Hyperkalemia with mild T wave peaking on EKG 3. Itching 4. Atrial fibrillation with RVR 5. Metabolic acidosis 6. Nausea/vomiting/diarrhea 7. DM Type II 8. Muscle spasms of back 9. Hypertension 10. Diverticulosis 11. CHD risk with DM Type II and HTN 12. Hard of hearing 13. Depressed mood/adjustment disorder  Hospital Course: [name] [name] is a 62 y.o. year old female presenting with PMH of DM-2, muscle spasms, and emphysematous pylenephritis s/p right nephrectomy who presents with nausea/vomiting for approximately 2 weeks. On admission found to have acute kidney injury with associated metabolic acidosis and hyperkalemia, as well as intractable n/v possibly secondary to azotemia.   # Chronic renal failure - Initially thought due most likely to nausea/vomiting preceding and during this admission leading to dehydration, along with pan-sensitive e coli UTI. Treated UTI with 10 days ceftriaxone and hydrated with IV fluids, with creatinine improving from 8 to 4-5 (baseline previously was <1). Renal consult recommended Renal US which was negative for obstruction but showed full-appearing kidney. With h/o previous emphysematous pyelo and loss of one kidney, consulted urology, who found no abnormalities or reason for urologic intervention. ANCA, anti GBM, and ANA negative. Etiology remained unclear. On 4/3, signs of fluid overload in chest xray so discontinued IV fluids, performed echocardiogram (relatively WNL with trivial mitral regurgitation and mild tricuspid regurgitation). On 4/4, started hemodialysis via femoral  catheter. Creatinine was not improving with subsequent multiple treatments of hemodialysis and pt continued to be fluid overloaded on 2nd CXR. Dialyzed 4/4, 4/5, 4/8, 4/10, 4/14, 4/18, 4/19 and then began Tuesday-Thurs-Sat regimen. With plan for regular hemodialysis, placed AV fistula/graft left arm. Dosed renal vitamins including ca carbonate and calcitriol for low Ca. Patient was very interested in learning diabetic and renal diet recommendations and met with dietary services. Patient was deemed chronic renal failure and set up with outpatient M-W-F dialysis with appointment for next HD on 4/25. Discharged patient with renavit and Calcium carbonate. Pt has follow-up with Dr. Jimmy Footman scheduled (renal) and was instructed to follow up with Dr. Eden Lathe (vascular surgery) in 5-6 weeks.  # Anemia - Hemoglobin stable through admission ~8. Iron panel with sat 23%, ferritin 495, folate 8.4. FOBT was checked and negative x 2. Per renal, dosed IV iron and aranesp. Hgb on discharge was 8.9. Used mechanical PPX for DVT due to low Hgb and azotemia but once platelet count stable, switched to SQ heparin with no issues.   # Hyperkalemia with mild T wave peaking on EKG - Pt was given a dose of kayexilate with resultant hypokalemia, and was dosed IV K x 1 with resultant resolution. Mg level was normal.   # Itching - Severe itching that patient states is baseline at home due to "sensitive skin". Dosed PRN atarax that helped with itching. Dosed polysporin abx ointment for excoriation at base of neck that appeared honey-crusted 4/21, last dose 4/23 PM, and discharged with zinc oxide ointment and stopped atarax.  # Atrial fibrillation with RVR - Pt had a brief episode 4/8 with emesis and was started on diltiazem drip until returned to NSR and was having borderline low BP, so diltiazem drip stopped the following day with no further  episodes.  # Metabolic acidosis on admission - Thought likely due to diarrhea and corrected after  receiving IV fluids with bicarbonate. Now getting hemodialysis with tighter electrolyte control in dialysate.  # Nausea/vomiting/diarrhea - Unclear initial etiology possibly gastroenteritis, but thought to be worsened by azotemia. LFTs, lipase WNL but h pylori IgG positive. With no resolution even after hemodialysis x2, GI consulted and suggested possible gastroparesis and reglan and protonix started. Endoscopy was unrevealing; Gastric emptying study showed moderate delayed emptying. Improved with reglan and hemodialysis. GI signed off 4/11. Held off on h pylori treatment due to no EGD findings - can consider outpatient treatment if becomes an issue again. Tapered reglan from TID to daily dosing and discharged on this; plan to eventually discontinue reglan to prevent tardive dyskinesia. For intermittent diarrhea and constipation, dosed miralax and colace prn and c difficile was negative. Discharged patient on colace and protonix.  # Type II DM - A1c 6.1. Patient put on sensitive SSI. Continued low-dose aspirin for CHD risk with HTN. Discharged on lantus 4 units daily. Consider statin as outpatient.   # Muscle spasms of back - Pain controlled with tylenol PRN; discontinued home flexeril due to potential for anticholinergic effects. Patient never actually received in hospital.  # Hypertension - BP was initially elevated likely secondary to fluid overload state. After HD, pt continiued having significant fluctuations in BP. By discharge, stable and continued to monitor. PT was consulted and recommended Home Health PT and rolling walker with 5 inch wheels to prevent falls, which was set up by CM through [place].   # Diverticulosis - seen on CT scan with no evidence of diverticulitis. No issues.  # CHD risk with DM Type II and HTN - Continued low-dose aspirin. Consider starting statin as outpatient.  # Hard of hearing - Patient would benefit from hearing aid as outpatient.  # Depressed  mood/adjustment disorder - Patient is facing major life transition with new diagnosis of chronic renal failure. Consulted spiritual services and started effexor renally dosed, in place of [place] which patient wanted. Patient also became tearful more than once this hospitalization when discussing her current medical situation and her home situation, describing herself as a Chiropractor". Follow-up.    Discharge day physical exam: Filed Vitals:   03/08/13 1345  BP: 119/72  Pulse: 84  Temp: 98.6 F (37 C)  Resp: 16  General: NAD, sitting up in bedside chair in the sun. HOH.  Cardiovascular: RRR.1/6 SM no gallops or rubs, though distant.  Respiratory: CTAB. No wheezing appreciated. No increased work of breathing. HD Cath Right upper chest C/D/I  Abdomen: obese, soft, nondistended, NABS  Extremities: No LE edema, erythema or tenderness today. Left arm with 2 inch incision that is closed and non-erythematous with no exudate. Healing well.  NEURO: Awake, alert, normal speech, no focal findings, hard of hearing; hears best from the right ear.  SKIN: excoriations that are healing scabs on thighs and upper back - upper back excoriations with mild erythema and honey-colored crust  PSYCH: Makes appropriate eye contact, appears a little down with affect congruent but smiles occasionally and appropriately.    Procedures: EGD, echocardiogram, gastric emptying study, placement of hemodialysis catheter, removal of this catheter and placement of permanent left arm shunt; hemodialysis   Discharge Medications   Medication List    STOP taking these medications       pseudoephedrine-acetaminophen 30-500 MG Tabs  Commonly known as:  TYLENOL SINUS  TAKE these medications       acetaminophen 325 MG tablet  Commonly known as:  TYLENOL  Take 2 tablets (650 mg total) by mouth every 6 (six) hours as needed.     aspirin 81 MG EC tablet  Take 1 tablet (81 mg total) by mouth daily.     calcium  carbonate 1250 MG tablet  Commonly known as:  OS-CAL - dosed in mg of elemental calcium  Take 1 tablet (500 mg of elemental calcium total) by mouth 3 (three) times daily with meals.     DSS 100 MG Caps  Take 100 mg by mouth daily as needed.     insulin glargine 100 UNIT/ML injection  Commonly known as:  LANTUS  Inject 0.04 mLs (4 Units total) into the skin daily.     metoCLOPramide 5 MG tablet  Commonly known as:  REGLAN  Take 1 tablet (5 mg total) by mouth daily before breakfast.     multivitamin Tabs tablet  Take 1 tablet by mouth at bedtime.     pantoprazole 40 MG tablet  Commonly known as:  PROTONIX  Take 1 tablet (40 mg total) by mouth daily.     polymixin-bacitracin 500-10000 UNIT/GM Oint ointment  Apply 1 application topically 2 (two) times daily.     venlafaxine XR 37.5 MG 24 hr capsule  Commonly known as:  EFFEXOR-XR  Take 1 capsule (37.5 mg total) by mouth daily with breakfast.     Zinc Oxide 12.8 % ointment  Commonly known as:  TRIPLE PASTE  Apply topically as needed. Please allow current tube to go with her.        Pertinent Hospital Imaging/Labs  Albumin 2.4  FOBT neg x 2  Autoimmune studies (AND, GBM Ab, myeloperoxidase abs, serine protease 3) WNL  Alpha-1-globulin 10.2  Alpha-2-globulin 21.1  Beta-globulin 4.5, total protein ELP 5.7, albumin ELP 41.7  LFTs WNL, lipase 21  TSH 0.431  H pylori IgG 2.24  INR 4/22 0.98  c difficile 03/06/13 Neg  Recent Labs  Lab  03/05/13 0948  03/07/13 0550  03/07/13 1300   HGB  8.1*  9.0*  8.9*   HCT  26.0*  28.4*  28.0*   WBC  7.0  7.5  7.4   PLT  267  263  275     Recent Labs  Lab  03/02/13 0500  03/04/13 0635  03/05/13 0948  03/07/13 0550  03/07/13 1300   NA  125*  130*  132*  134*  134*   K  4.6  4.2  3.7  4.3  3.9   CL  91*  94*  96  97  97   CO2  26  28  29  28  27    GLUCOSE  124*  115*  173*  136*  156*   BUN  30*  16  16  25*  29*   CREATININE  5.32*  3.88*  3.42*  4.31*  4.40*   CALCIUM   8.7  8.6  8.8  9.2  8.7   PHOS  3.2  3.1  2.7  --  3.3    Imaging/Diagnostic Tests:   Renal US 4/1: IMPRESSION:  Post right nephrectomy.  Fullness of collecting system at inferior pole of left kidney  without gross pelviectasis or evidence of obstruction.   CT abd/pelvis: IMPRESSION:  1. Previous right nephrectomy. No evidence of urolithiasis or  hydronephrosis.  2. Sigmoid diverticulosis. No radiographic evidence of  diverticulitis.  3.  Tiny less than 1 cm calcified uterine fibroid.  4. Small bilateral pleural effusions, and bibasilar interstitial  infiltrates suspicious for pulmonary edema.   Chest x-ray 4/3: IMPRESSION:  1. Findings, as above, suggestive of mild to moderate congestive  heart failure.  4-8 chest xray: IMPRESSION:  Suboptimal inspiration. Worsening interstitial pulmonary edema  since the examination 5 days ago. Probable small bilateral pleural  effusions.   Echocardiogram 4/8: Normal systolic function (EF 55-60%), trivial mitral regurgitation, mild tricuspid regurgitation  Upper extremity doppler - all appear compressible    Discharge instructions: Instructions regarding dialysis and access care, shunt placement care, and follow up appointments  Condition at discharge: stable  Disposition: Home with Home Health PT   Pending Tests: None  Follow up: Follow-up Information   Schedule an appointment as soon as possible for a visit with Sherren Kerns, MD. (5-6 week follow up)    Contact information:   [address] [place] [address] [phone]       Follow up with DETERDING,JAMES L, MD. Schedule an appointment as soon as possible for a visit on 03/10/2013. (12:00 )    Contact information:   [facility]  [phone]       Follow up with [facility], ANGELA, MD On 03/15/2013. (2:45 PM)    Contact information:   [address] [place] [address] [phone]       Follow up Issues:  - F/u with Renal to help  understand etiology of chronic renal failure - Consider H pylori treatment if pain/nausea becomes an issue again - Eventual discontinuation of reglan to prevent tardive dyskinesia - Consider statin - Attempt to get patient hearing aid  - F/u on mood with initiation of Effexor renally dosed; f/u on hoarding   [name] [name] 03/08/2013 10:38 PM PGY-1 Family Medicine Teaching Service Service pager [phone]

## 2013-02-19 NOTE — Progress Notes (Signed)
I examined this patient and discussed the care plan with Dr Sharol Given and the [facility] team and agree with assessment and plan as documented in the progress note above. She now says she vomited after eating apple sauce this morning and sometimes after coughing, but isn't nauseated currently. She does endorse early satiety and bloated feeling after eating before the current illness. She may benefit from a gastric emptying study.

## 2013-02-19 NOTE — Progress Notes (Signed)
Family Medicine Teaching Service Daily Progress Note Service Page: [phone]  Subjective:  She underwent dialysis yesterday. She said it was curtailed due to low blood pressures. She thinks she may have been there for 1-2 hours.  She was able to keep down applesauce last night, and she wants to try again to eat today. She still endorses some nausea. She last vomited yesterday.   Objective: Temp:  [98.2 F (36.8 C)-98.8 F (37.1 C)] 98.6 F (37 C) (04/06 0357) Pulse Rate:  [59-74] 70 (04/06 0357) Resp:  [14-23] 20 (04/06 0357) BP: (78-181)/(34-137) 153/63 mmHg (04/06 0357) SpO2:  [91 %-100 %] 94 % (04/06 0357) Weight:  [184 lb 8.4 oz (83.7 kg)-188 lb 0.8 oz (85.3 kg)] 184 lb 8.4 oz (83.7 kg) (04/05 2135)  Exam: General: resting comfortably in bed, NAD Cardiovascular: RRR. No murmurs, rubs, or gallops. Distant.  Respiratory: CTAB. No wheezing appreciated. Normal effort.  Abdomen: obese, soft, nondistended, NT , NABS.  Extremities: SCD in place bilaterally, no edema, clubbing, or cyanosis  BACK: Mild tenderness on palpation of midline lumbar spine and RT flank.  I have reviewed the patient's medications, labs, imaging, and diagnostic testing.  Notable results are summarized below.  CBC BMET   Recent Labs Lab 02/16/13 0502 02/17/13 0404 02/18/13 0439  WBC 11.2* 13.1* 13.9*  HGB 7.8* 8.0* 8.7*  HCT 23.5* 24.4* 27.6*  PLT 308 302 325    Recent Labs Lab 02/16/13 0502 02/17/13 0454 02/18/13 0445  NA 137 134* 133*  K 3.1* 3.7 4.1  CL 92* 89* 92*  CO2 29 30 26   BUN 85* 79* 57*  CREATININE 7.54* 7.77* 6.19*  GLUCOSE 162* 106* 102*  CALCIUM 5.9* 6.0* 7.1*     Imaging/Diagnostic Tests:  Renal US 4/1: IMPRESSION:  Post right nephrectomy.  Fullness of collecting system at inferior pole of left kidney  without gross pelviectasis or evidence of obstruction.  FOBT neg x 2  CT abd/pelvis: IMPRESSION:  1. Previous right nephrectomy. No evidence of urolithiasis or   hydronephrosis.  2. Sigmoid diverticulosis. No radiographic evidence of  diverticulitis.  3. Tiny less than 1 cm calcified uterine fibroid.  4. Small bilateral pleural effusions, and bibasilar interstitial  infiltrates suspicious for pulmonary edema.  Chest x-ray 4/3: IMPRESSION:  1. Findings, as above, suggestive of mild to moderate congestive  heart failure.  Plan: [name] [name] is a 62 y.o. female presenting with PMH of DM-2, muscle spasms, and emphysematous pylenephritis s/p right nephrectomy who presents with nausea/vomiting for approximately 2 weeks. On admission found to have acute kidney injury with associated metabolic acidosis and hyperkalemia, as well as intractable n/v possibly secondary to azotemia.   # AKI - Thought due most likely to nausea/vomiting preceding and during this admission leading to dehydration.  Renal US with full-appearing kidney with no sign of obstruction. Contulted urology due to h/o previous emphysematous pyelonephritis, saw pt and signed off with no further rec or concern for obstruction.  - UTI likely contributing, e coli pan-sensitive; continue CTX - Renal following and we greatly appreciate their recommendations.     4/5, 4/6 HD   -Follow-up ANCA, anti GBM, ANA in case of acute GN, urine eosinophils  - Follow-up Cr; it was trending back up as of 4/5 - Continue calcium carbonate for low calcium   # Hyperkalemia with mild T wave peaking on EKG, s/p kayexalate with resultant hypokalemia, dosed IV K x 1, resolved; normal Mg.  # Metabolic acidosis - Thought to be likely from  diarrhea. Corrected after IV fluids with bicarbonate   # UTI - Urine Cx resulted >100,000 cfu e coli, pan-sensitive; continue CTX   # Nausea/vomiting/diarrhea - Thought to be cause of AKI. Unclear etiology, as the long duration of symptoms makes gastroenteritis less likely; no elevated lactic acid. We had suspected this may be secondary to azotemia, but it has been persistent  despite HD x 2 and improvement in BUN.  - Consult GI due to persistent nausea/vomiting. She is a diabetic and may have component of gastroparesis; HgbA1c has indicated good control, however at 6.1, but this is still possible.  - Protonix  - Ordered h pylori antibody - in proces - Zofran PRN (x 1), phenergan prn (x 1 ); she reports antiemetics not that helpful   # DM-2 - A1c 6.1  - CBG's TID AC and QHS  - Sensitive SSI   # Anemia - Iron panel with sat 23%, ferritin 495, folate 8.4; IV Fe per renal, FOBT neg x 2  - IV iron started  - Will monitor closely during admission. Follow-up AM Hgb.   # Muscle spasms of back  - Discontinued flexeril (pt has not received in >24 hours) due to anticholinergic effects  - Tylenol PRN   # Hypertension - BP 173/71 today; likely 2/2 fluid overload  - Continue to monitor. She has significant fluctuations in blood pressures.   # Diverticulosis - No evidence of diverticulitis on CT. If develops fever or other signs of infection, consider starting antibiotics for diverticulitis    FEN/GI: KVO; renal diet  Prophylaxis: Discontinued heparin given low hgb and azotemia; SCDs  Disposition: Pending clinical improvement in nausea and vomiting and further renal management for AKI  Code Status: Full code   [facility] [place], Otto Felkins, MD 02/19/2013, 6:22 AM

## 2013-02-20 ENCOUNTER — Encounter (HOSPITAL_COMMUNITY): Payer: Self-pay | Admitting: Gastroenterology

## 2013-02-20 ENCOUNTER — Encounter (HOSPITAL_COMMUNITY)
Admission: EM | Disposition: A | Discharge status: Home Health | Payer: Self-pay | Source: Home / Self Care | Attending: Family Medicine

## 2013-02-20 DIAGNOSIS — N12 Tubulo-interstitial nephritis, not specified as acute or chronic: Secondary | ICD-10-CM

## 2013-02-20 HISTORY — PX: ESOPHAGOGASTRODUODENOSCOPY: SHX5428

## 2013-02-20 LAB — UIFE/LIGHT CHAINS/TP QN, 24-HR UR
Albumin, U: DETECTED
Free Kappa Lt Chains,Ur: 5.74 mg/dL — ABNORMAL HIGH (ref 0.14–2.42)
Free Lambda Lt Chains,Ur: 1.62 mg/dL — ABNORMAL HIGH (ref 0.02–0.67)
Gamma Globulin, Urine: DETECTED — AB
Total Protein, Urine: 9.4 mg/dL

## 2013-02-20 LAB — PROTEIN ELECTROPHORESIS, SERUM
Albumin ELP: 41.7 % — ABNORMAL LOW (ref 55.8–66.1)
Alpha-2-Globulin: 21.1 % — ABNORMAL HIGH (ref 7.1–11.8)
M-Spike, %: NOT DETECTED g/dL
Total Protein ELP: 5.7 g/dL — ABNORMAL LOW (ref 6.0–8.3)

## 2013-02-20 LAB — GLUCOSE, CAPILLARY
Glucose-Capillary: 103 mg/dL — ABNORMAL HIGH (ref 70–99)
Glucose-Capillary: 138 mg/dL — ABNORMAL HIGH (ref 70–99)

## 2013-02-20 LAB — H. PYLORI ANTIBODY, IGG: H Pylori IgG: 2.24 {ISR} — ABNORMAL HIGH

## 2013-02-20 LAB — CBC
MCHC: 31.8 g/dL (ref 30.0–36.0)
RDW: 14.4 % (ref 11.5–15.5)

## 2013-02-20 LAB — RENAL FUNCTION PANEL
Albumin: 2.4 g/dL — ABNORMAL LOW (ref 3.5–5.2)
Chloride: 95 mEq/L — ABNORMAL LOW (ref 96–112)
GFR calc Af Amer: 8 mL/min — ABNORMAL LOW (ref >90)
GFR calc non Af Amer: 7 mL/min — ABNORMAL LOW (ref >90)
Potassium: 4.4 mEq/L (ref 3.5–5.1)

## 2013-02-20 LAB — MPO/PR-3 (ANCA) ANTIBODIES: Myeloperoxidase Abs: <1 AU/mL (ref <20)

## 2013-02-20 LAB — ANA: Anti Nuclear Antibody(ANA): NEGATIVE

## 2013-02-20 SURGERY — EGD (ESOPHAGOGASTRODUODENOSCOPY)
Anesthesia: Moderate Sedation | Laterality: Left

## 2013-02-20 MED ORDER — BENZONATATE 100 MG PO CAPS
200.0000 mg | ORAL_CAPSULE | Freq: Three times a day (TID) | ORAL | Status: DC
  Administered 2013-02-20: 200 mg via ORAL
  Filled 2013-02-20 (×4): qty 2

## 2013-02-20 MED ORDER — TRAZODONE HCL 50 MG PO TABS
50.0000 mg | ORAL_TABLET | Freq: Every day | ORAL | Status: DC
  Administered 2013-02-20: 50 mg via ORAL
  Filled 2013-02-20 (×2): qty 1

## 2013-02-20 MED ORDER — SODIUM CHLORIDE 0.9 % IV SOLN: INTRAVENOUS | Status: DC

## 2013-02-20 MED ORDER — BUTAMBEN-TETRACAINE-BENZOCAINE 2-2-14 % EX AERO
INHALATION_SPRAY | CUTANEOUS | Status: DC | PRN
  Administered 2013-02-20: 2 via TOPICAL

## 2013-02-20 MED ORDER — MIDAZOLAM HCL 10 MG/2ML IJ SOLN
INTRAMUSCULAR | Status: DC | PRN
  Administered 2013-02-20 (×4): .5 mg via INTRAVENOUS

## 2013-02-20 MED ORDER — MIDAZOLAM HCL 5 MG/ML IJ SOLN
INTRAMUSCULAR | Status: AC
  Filled 2013-02-20: qty 2

## 2013-02-20 MED ORDER — FENTANYL CITRATE 0.05 MG/ML IJ SOLN
INTRAMUSCULAR | Status: AC
  Filled 2013-02-20: qty 2

## 2013-02-20 NOTE — Progress Notes (Signed)
Seen and examined.  Discussed with Dr. Karie Schwalbe.  Appreciate GI and renal help. [name] [name] is finally feeling better.  She was eating lunch as I interviewed her and tolerating well.  She feels the best she has felt is weeks.  Of course, she is quite weak.  Will transfer to the floor and disconnect her for foley and O2.  PT eval will be important.  Providing the nausea does not recur, we are looking at DC in the next couple of days.  If it does recur, we will do gastric emptying and likely a trial of reglan.

## 2013-02-20 NOTE — Progress Notes (Signed)
Interim Progress Note  Family Medicine Resident Pager [phone]  Patient name: [name] [name] Medical record number: [id number] Date of birth: 1950/12/20 Age: 62 y.o. Gender: female  Overview: Pt is a 61 y.o. yo female with a PMHX of DM II, with prior history emphysematous pyelonephritis-- s/p right nephrectomy in 2012, was admitted to [facility] on 02/13/2013 with nausea, vomiting, diarrhea for 2 weeks resulting in AKI with associated metabolic acidosis and hyperkalemia.  S: Patient notes that improving PO intake with increased appetite. She denies nausea and vomiting right now. She notes that she is very stressed about her social situation. She has been depressed for several years. This has caused her to sleep excessively, become less social, and allow her house to become filthy.  She says it is analogous to the show [employer], which she was watching when I walked into the room. She says that she is "ashamed" of her house and her appearance, and feels like a "failure." She also said that her husband is her best social support, but does not have any other family members to confide in. She has no friends and notes being very lonely. Previous treatment for depression included prozac, which was stopped due to nightmares.   O: BP 146/56  Pulse 69  Temp(Src) 97.8 F (36.6 C) (Oral)  Resp 11  Ht 5\' 5"  (1.651 m)  Wt 184 lb 8.4 oz (83.7 kg)  BMI 30.71 kg/m2  SpO2 96% Gen: chronically ill appearing, persistant dry cough Psych: very tearful, depressed affect, normal thought content and speech pattern  A/P: 62 year old w/ AKI on HD who has severe depression.  - Increase trazodone to 50 mg nightly to improve sleep - Consider starting SSRI and referral for counseling - Tessalon for cough.   Si Raider Clinton Sawyer, MD, MBA 02/20/2013, 7:21 PM Family Medicine Resident, PGY-2

## 2013-02-20 NOTE — Progress Notes (Signed)
PT/ OT Cancellation Note  Patient Details Name: [name] MRN: [id number] DOB: 11-Jun-1951   Cancelled Treatment:    Reason Eval/Treat Not Completed: Patient at procedure or test/ unavailable (endoscopy)  Harrel Carina [facility] 02/20/2013, 10:33 AM Pager: [phone]

## 2013-02-20 NOTE — Op Note (Signed)
[facility] [address] [place], [address]   ENDOSCOPY PROCEDURE REPORT  PATIENT: [name] [name], [name] [name].  MR#: [id number] BIRTHDATE: 10-15-1951 , 61  yrs. old GENDER: Female ENDOSCOPIST:Andrell Bergeson, MD REFERRED BY:  Unassigned PROCEDURE DATE:  02/20/2013 PROCEDURE:      upper endoscopy ASA CLASS: INDICATIONS:   nausea and vomiting, recurrent, in a diabetic patient who presented to the hospital with infection and acute renal insufficiency MEDICATION:    Versed 4 mg IV TOPICAL ANESTHETIC:    Cetacaine spray  DESCRIPTION OF PROCEDURE:   the patient was brought from her hospital room to the [facility] cone endoscopy unit and received the above sedation after time out. She remained clinically stable through the course of the procedure.  The Pentax video endoscope was passed under direct vision. The larynx looked grossly normal.  The esophagus was entered under direct vision without undue difficulty, and was normal in its entirety. No reflux esophagitis, Barrett's esophagus, varices, infection, neoplasia, ring, stricture, or significant hiatal hernia were noted.  The stomach was entered. It contained essentially no residual. A small amount of bile-stained mucus was present here and there.  There was a rather prominent absence of gastric rugal folds in the stomach, consistent with gastric mucosal atrophy.  In the antrum of the stomach, there was mottled erythema, without any erosive changes or ulcers. No polyps or masses were seen.  A retroflex view the cardia was normal, with perhaps a minimal hiatal hernia present.  The pylorus, duodenal bulb, and second duodenum looked normal. In particular, there was no evidence of any anatomic gastric outlet obstruction.  No gastric motility was observed during this exam, but that is not necessarily abnormal while under sedation.  No biopsies were obtained. The patient tolerated the procedure  well.     COMPLICATIONS: None  ENDOSCOPIC IMPRESSION:  1. No anatomic cause for patient's nausea and vomiting seen. This makes the impression of gastroparesis more likely.  2. Gastric mucosal atrophy and erythema of uncertain clinical significance, and doubtful relevance to the patient's symptoms.  RECOMMENDATIONS:  1. I would favor motility evaluation with a gastric emptying scan.  2. Once the patient's renal failure clears, if nausea is still a problem, could consider a trial of sucralfate for possible bile reflux gastritis.    _______________________________ Rosalie DoctorBernette Redbird, MD 02/20/2013 12:02 PM    PATIENT NAME:  [name] [name]. MR#: [id number]

## 2013-02-20 NOTE — Progress Notes (Signed)
Family Medicine Teaching Service Daily Progress Note Service Page: [phone]  Subjective:  Very sleepy this morning, reporting no nausea or vomiting (no emesis since 6pm yesterday) and that her throat hurts and she is hungry. Per RN, pt has been doing well overnight.  Objective: Temp:  [98.4 F (36.9 C)-98.7 F (37.1 C)] 98.5 F (36.9 C) (04/07 0335) Pulse Rate:  [65-73] 65 (04/07 0335) Resp:  [12-23] 22 (04/07 0335) BP: (140-169)/(60-80) 169/70 mmHg (04/07 0335) SpO2:  [97 %-98 %] 97 % (04/07 0335)  Exam: General: resting comfortably in bed, NAD Cardiovascular: RRR. No murmurs, rubs, or gallops. 2+ bilateral DP pulses. Right femoral catheter in place for hemodialysis Respiratory: CTAB. No wheezing appreciated. Normal effort. Possible left upper and lower lobe fine crackles v poor effort Abdomen: obese, soft, nondistended, NT, NABS.  Extremities: SCD in place bilaterally, no edema, clubbing, or cyanosis; tibial tenderness that pt states is her baseline  GU: Foley catheter in place NEURO: Sleepy but answers questions appropriately  ALP:FXTK = 833 in, 1250 out, PO 720mL   I have reviewed the patient's medications, labs, imaging, and diagnostic testing.  Notable results are summarized below. Medications - No PRN over 24 hours as of 7 AM   Labs/Imaging -   CBC BMET   Recent Labs Lab 02/18/13 0439 02/19/13 0747 02/20/13 0445  WBC 13.9* 11.9* 10.7*  HGB 8.7* 8.9* 8.8*  HCT 27.6* 28.3* 27.7*  PLT 325 310 277    Recent Labs Lab 02/19/13 0747 02/19/13 1518 02/20/13 0445  NA 134* 133* 135  K 4.4 4.8 4.4  CL 93* 93* 95*  CO2 26 26 28   BUN 41* 42* 44*  CREATININE 5.24* 5.37* 5.87*  GLUCOSE 95 126* 122*  CALCIUM 8.2* 8.2* 8.2*     Albumin 2.4  Imaging/Diagnostic Tests:  Renal US 4/1: IMPRESSION:  Post right nephrectomy.  Fullness of collecting system at inferior pole of left kidney  without gross pelviectasis or evidence of obstruction.  FOBT neg x 2  CT  abd/pelvis: IMPRESSION:  1. Previous right nephrectomy. No evidence of urolithiasis or  hydronephrosis.  2. Sigmoid diverticulosis. No radiographic evidence of  diverticulitis.  3. Tiny less than 1 cm calcified uterine fibroid.  4. Small bilateral pleural effusions, and bibasilar interstitial  infiltrates suspicious for pulmonary edema.  Chest x-ray 4/3: IMPRESSION:  1. Findings, as above, suggestive of mild to moderate congestive  heart failure.    Assessment and Plan: DEZHANE STATEN is a 62 y.o. female presenting with PMH of DM-2, muscle spasms, and emphysematous pylenephritis s/p right nephrectomy who presented with nausea/vomiting for approximately 2 weeks. On admission found to have acute kidney injury with associated metabolic acidosis and hyperkalemia, as well as intractable n/v possibly secondary to azotemia.   # AKI - Thought due most likely to nausea/vomiting preceding and during this admission leading to dehydration. Renal US with full-appearing kidney with no sign of obstruction. Consulted urology due to h/o previous emphysematous pyelonephritis, saw pt and signed off with no further rec or concern for obstruction. With no significant improvement with IV fluids, and fluid overload 4/3 per CXR, dialyzed 4/4 and 4/5. - UTI likely contributing, e coli pan-sensitive; continue CTX (abx 24-OXB course), once certain tolerating PO, transition to keflex - Renal following and we greatly appreciate their recommendations.     4/5, 4/6 HD - Follow-up ANCA, anti GBM, ANA in case of acute GN, urine eosinophils  - Follow-up Cr; down since admission but slightly uptrending at 5.87 - Continue  calcium carbonate for low calcium   # Hyperkalemia with mild T wave peaking on EKG, s/p kayexalate with resultant hypokalemia, dosed IV K x 1, resolved; normal Mg.  # Metabolic acidosis - Thought to be likely from diarrhea. Corrected after IV fluids with bicarbonate   # UTI - Urine Cx resulted >100,000  cfu e coli, pan-sensitive; continue CTX started 4/1 for 10 days course due to h/o emphysematous pyelo  # Nausea/vomiting/diarrhea - Seems improving since last night, possibly due to hemodialysis. Thought to be cause of AKI. Unclear etiology, as the long duration of symptoms makes gastroenteritis less likely; no elevated lactic acid. We had suspected this may be secondary to azotemia, now with some improvement reported since yesterday evening with HD x 2 and improvement in BUN.  - GI consulted yesterday due to persistent nausea/vomiting and suspects gastroparesis. (A1c has indicated good control at 6.1, however 14.6 in 2012). LFTs WNL; EGD and lipase today; if EGD unrevealing, consider gastric emptying study and ?abdominal US; Appreciate consult and will f/u further recs - Protonix  - F/u pending h pylori antibody - Zofran PRN, phenergan prn; she reports antiemetics not that helpful   # DM-2 - A1c 6.1  - CBG's TID AC and QHS  - Sensitive SSI   # Anemia - Iron panel with sat 23%, ferritin 495, folate 8.4; IV Fe per renal, FOBT neg x 2  - IV iron started and on aranesp - Will monitor closely during admission. Follow-up  - Transfuse for Hgb <7  # Muscle spasms of back  - Discontinued flexeril (pt has not received in >24 hours) due to anticholinergic effects  - Tylenol PRN   # Hypertension - BP continues to be high, 2/2 fluid overload v renal disease - Continue to monitor. She has significant fluctuations in blood pressures.   # Diverticulosis - No evidence of diverticulitis on CT. If develops fever or other signs of infection, consider starting antibiotics for diverticulitis    # CHD risk with DM Type II and HTN - continue low-dose aspirin  FEN/GI: SLIV; diet NPO since yesterday likely for EGD Prophylaxis: Discontinued heparin given low hgb and azotemia; SCDs  Disposition: Pending clinical improvement in nausea and vomiting and further renal management for AKI  - f/u PT/OT recs Code  Status: Full code   Conni Slipper, MD 02/20/2013, 6:54 AM

## 2013-02-20 NOTE — Interval H&P Note (Signed)
History and Physical Interval Note:  02/20/2013 11:25 AM  [name] [name]  has presented today for surgery, with the diagnosis of nausea and vomiting  The various methods of treatment have been discussed with the patient. After consideration of risks, benefits and other options for treatment, the patient has consented to  Procedure(s): ESOPHAGOGASTRODUODENOSCOPY (EGD) (Left) as a surgical intervention .  The patient's history has been reviewed, patient examined, no change in status, stable for surgery.  I have reviewed the patient's chart and labs.  Questions were answered to the patient's satisfaction.     Florencia Reasons

## 2013-02-20 NOTE — Progress Notes (Signed)
As per OT note cancelling for today due to procedure.  Will check back tomorrow. [place], [place] [phone] 02/20/2013

## 2013-02-20 NOTE — Progress Notes (Signed)
[facility]  Subjective:  Back from Endo No anatomic reason for N/V seen on EGD Actually ate some lunch and is thirsty Gastroparesis still felt likely Dialyzed 4/5 but treatment shortened due to low BP (lowest 83/34)  Objective: Vital signs in last 24 hours: Blood pressure 164/77, pulse 70, temperature 99.1 F (37.3 C), temperature source Oral, resp. rate 18, height 5\' 5"  (1.651 m), weight 83.7 kg (184 lb 8.4 oz), SpO2 97.00%.  PHYSICAL EXAM General--as above  Very hard of hearing Chest--clear Heart--no rub  Abd--nontender  Extr--no edema, HD cath in R femoral vein (4/4)  Date   Weight 1 Apr  83.3  4 Apr  88.9 5 Apr  85.3 pre HD 5 Apr  83.7 post HD 7 Apr  None recorded  I/O last 3 completed shifts: In: 517 [P.O.:780; I.V.:3; IV Piggyback:50] Out: 2604 [Urine:2000; Other:604] Total I/O In: 200 [I.V.:200] Out: 375 [Urine:375]  Lab Results:  Recent Labs Lab 02/18/13 0445 02/19/13 0747 02/19/13 1518 02/20/13 0445  NA 133* 134* 133* 135  K 4.1 4.4 4.8 4.4  CL 92* 93* 93* 95*  CO2 26 26 26 28   BUN 57* 41* 42* 44*  CREATININE 6.19* 5.24* 5.37* 5.87*  GLUCOSE 102* 95 126* 122*  CALCIUM 7.1* 8.2* 8.2* 8.2*  PHOS 5.3* 4.2  --  4.2     Recent Labs  02/19/13 0747 02/20/13 0445  WBC 11.9* 10.7*  HGB 8.9* 8.8*  HCT 28.3* 27.7*  PLT 310 277   Results for [name], MINTEER (MRN [id number]) as of 02/20/2013 15:11  Ref. Range 02/18/2013 11:33  ANA Latest Range: NEGATIVE  NEGATIVE  GBM Ab Latest Range: <20 AU/mL <1  Myeloperoxidase Abs Latest Range: <20 AU/mL <1  Serine Protease 3 Latest Range: <20 AU/mL <1   Results for [name], [name] (MRN [id number]) as of 02/20/2013 15:11  Ref. Range 02/17/2013 04:04 02/17/2013 16:37  Hepatitis B Surface Ag Latest Range: NEGATIVE  NEGATIVE   Hep B S Ab Latest Range: NONREACTIVE  NONREACTIVE   Hep B Core Total Ab Latest Range: NEGATIVE  NEGATIVE   H Pylori IgG No range found  2.24 (H)   I have reviewed the patient's  current medications. Scheduled: . aspirin EC  81 mg Oral Daily  . calcium carbonate  1 tablet Oral TID WC  . cefTRIAXone (ROCEPHIN)  IV  1 g Intravenous Q24H  . darbepoetin (ARANESP) injection - NON-DIALYSIS  100 mcg Subcutaneous Q Wed-1800  . heparin lock flush  2,800 Units Intravenous Once  . insulin aspart  0-9 Units Subcutaneous TID WC  . pantoprazole  40 mg Oral Daily  . sodium chloride  3 mL Intravenous Q12H    Assessment/Plan: Pt is a 62 y.o. yo female with a PMHX of DM II, with prior history emphysematous pyelonephritis-- s/p right nephrectomy in 2012, was admitted to [facility] on 02/13/2013 with nausea, vomiting, diarrhea for 2 weeks resulting in AKI with associated metabolic acidosis and hyperkalemia.   Initially felt d/t hypovolemia with  BUN 101, Cr 8.42.    Baseline Cr from 03/20/11-09/14/11 trended from 0.51-0.93, but no labs since that time, and has not responded to IV fluid hydration, requiring initiation of HD 02/17/13  1. Acute Kidney Injury Past hx R nephrectomy for emphysematous pyelo Kidney normal size by ultrasound GU not suspicious of anything acc to Dr. McDiarmid's note.   Urine EOS--pending,  ANA, ANCA and antiGBM all negative IVF did not reverse potential prerenal problem.  CXR looked wet on 3  Apr.  Dialyzed on 4 and 5 Apr.  Vomiting no better with dialysis.    HD cath still in R  Fem vein (4/4). No indications for HD today but creatinine continues to rise in interdialytic interval Will reassess 4/8 Etiology of her AKI not readily apparent to me (assuming that it is acute)  2. Hyperkalemia--corrected  3. Metabolic Acidosis - acidosis corrected   4. Anemia-- FOBT neg. Fe panel sat 23%, ferritin 495, folate 8.4.  On IV Fe and Aranesp.  Transfuse for hgb <7. Hgb 8.9 today  5. HTN/Vol -- BP ok today. Urine output 1500 cc past 24 hr.   6. Vomiting-- this has persisted since admission. On protonix.  EGD negative IS H pylori positive  7. UTI---E coli, sens  pending, on ceftriaxone.       LOS: 7 days   Ikechukwu Cerny B 02/20/2013,3:08 PM   .labalb

## 2013-02-20 NOTE — Progress Notes (Signed)
Endoscopy well tolerated, and unrevealing. Some chronic atrophic gastritis was noted, with minimal bile reflux. No anatomic cause for nausea and vomiting endoscopically evident. Suspect gastroparesis.  Anticipate doing a gastric emptying scan prior to discharge if nausea and vomiting persist. However, at the moment, the patient is doing better, having tolerated supper last night and feeling hungry this morning.  Florencia Reasons, M.D. [phone]

## 2013-02-21 ENCOUNTER — Inpatient Hospital Stay (HOSPITAL_COMMUNITY): Payer: Medicaid Other

## 2013-02-21 ENCOUNTER — Encounter (HOSPITAL_COMMUNITY): Payer: Self-pay | Admitting: Gastroenterology

## 2013-02-21 LAB — RENAL FUNCTION PANEL
Albumin: 2.3 g/dL — ABNORMAL LOW (ref 3.5–5.2)
Calcium: 7.8 mg/dL — ABNORMAL LOW (ref 8.4–10.5)
Chloride: 94 mEq/L — ABNORMAL LOW (ref 96–112)
Creatinine, Ser: 6.44 mg/dL — ABNORMAL HIGH (ref 0.50–1.10)
GFR calc non Af Amer: 6 mL/min — ABNORMAL LOW (ref >90)

## 2013-02-21 LAB — URINE CULTURE

## 2013-02-21 LAB — CBC
MCH: 29.1 pg (ref 26.0–34.0)
MCHC: 30.7 g/dL (ref 30.0–36.0)
Platelets: 257 10*3/uL (ref 150–400)

## 2013-02-21 LAB — GLUCOSE, CAPILLARY: Glucose-Capillary: 123 mg/dL — ABNORMAL HIGH (ref 70–99)

## 2013-02-21 MED ORDER — DILTIAZEM HCL 100 MG IV SOLR
5.0000 mg/h | INTRAVENOUS | Status: DC
  Administered 2013-02-21: 5 mg/h via INTRAVENOUS
  Filled 2013-02-21: qty 100

## 2013-02-21 MED ORDER — GUAIFENESIN-DM 100-10 MG/5ML PO SYRP
5.0000 mL | ORAL_SOLUTION | ORAL | Status: DC | PRN
  Filled 2013-02-21: qty 5

## 2013-02-21 MED ORDER — LORATADINE 10 MG PO TABS
10.0000 mg | ORAL_TABLET | Freq: Every day | ORAL | Status: DC
  Administered 2013-02-21 – 2013-02-24 (×4): 10 mg via ORAL
  Filled 2013-02-21 (×5): qty 1

## 2013-02-21 MED ORDER — METOCLOPRAMIDE HCL 5 MG/ML IJ SOLN
5.0000 mg | Freq: Four times a day (QID) | INTRAMUSCULAR | Status: DC
  Administered 2013-02-21 – 2013-02-22 (×3): 5 mg via INTRAVENOUS
  Filled 2013-02-21 (×8): qty 1

## 2013-02-21 MED ORDER — SODIUM CHLORIDE 0.9 % IJ SOLN
INTRAMUSCULAR | Status: AC
  Administered 2013-02-21: 10 mL
  Filled 2013-02-21: qty 10

## 2013-02-21 MED ORDER — CEFAZOLIN SODIUM 1-5 GM-% IV SOLN
1.0000 g | Freq: Once | INTRAVENOUS | Status: AC
  Administered 2013-02-22: 1 g via INTRAVENOUS
  Filled 2013-02-21: qty 50

## 2013-02-21 NOTE — Progress Notes (Signed)
Utilization review completed.  

## 2013-02-21 NOTE — Progress Notes (Signed)
Family Medicine Teaching Service Daily Progress Note Service Page: [phone]  Subjective:  Still tolerating PO without nausea, notes some itching (responding to benadryl, for last few days she says) and increased cough with intermittent dyspnea  Objective: Temp:  [98.4 F (36.9 C)-98.7 F (37.1 C)] 98.5 F (36.9 C) (04/07 0335) Pulse Rate:  [65-73] 65 (04/07 0335) Resp:  [12-23] 22 (04/07 0335) BP: (140-169)/(60-80) 169/70 mmHg (04/07 0335) SpO2:  [97 %-98 %] 97 % (04/07 0335)  Exam: General: resting comfortably in bed, NAD Cardiovascular: RRR. No murmurs, rubs, or gallops. 2+ bilateral DP pulses. Right femoral catheter in place for hemodialysis Respiratory: CTAB. No wheezing appreciated. Normal effort. L upper lobe coarse breath sounds, also poor effort Abdomen: obese, soft, nondistended, NT, NABS.  Extremities: SCD in place bilaterally, no edema, clubbing, or cyanosis; GU: Foley catheter in place NEURO: Sleepy but answers questions appropriately  ION:GEXB = 833 in, 1250 out, PO 745mL   I have reviewed the patient's medications, labs, imaging, and diagnostic testing.  Notable results are summarized below. Medications - No PRN over 24 hours as of 7 AM   Scheduled Meds: . aspirin EC  81 mg Oral Daily  . benzonatate  200 mg Oral TID  . calcium carbonate  1 tablet Oral TID WC  . cefTRIAXone (ROCEPHIN)  IV  1 g Intravenous Q24H  . darbepoetin (ARANESP) injection - NON-DIALYSIS  100 mcg Subcutaneous Q Wed-1800  . heparin lock flush  2,800 Units Intravenous Once  . insulin aspart  0-9 Units Subcutaneous TID WC  . pantoprazole  40 mg Oral Daily  . sodium chloride  3 mL Intravenous Q12H  . traZODone  50 mg Oral QHS   Continuous Infusions:  PRN Meds:.sodium chloride, sodium chloride, acetaminophen, acetaminophen, diphenhydrAMINE, feeding supplement (NEPRO CARB STEADY), heparin, heparin, lidocaine (PF), lidocaine-prilocaine, ondansetron (ZOFRAN) IV, ondansetron,  pentafluoroprop-tetrafluoroeth, promethazine, Zinc Oxide\  Labs/Imaging -   Recent Labs Lab 02/19/13 0747 02/20/13 0445 02/21/13 0522  HGB 8.9* 8.8* 8.9*  HCT 28.3* 27.7* 29.0*  WBC 11.9* 10.7* 11.8*  PLT 310 277 257    Recent Labs Lab 02/16/13 0502 02/17/13 0454 02/18/13 0445 02/19/13 0747 02/19/13 1518 02/20/13 0445 02/21/13 0522  NA 137 134* 133* 134* 133* 135 131*  K 3.1* 3.7 4.1 4.4 4.8 4.4 4.6  CL 92* 89* 92* 93* 93* 95* 94*  CO2 29 30 26 26 26 28 27   GLUCOSE 162* 106* 102* 95 126* 122* 121*  BUN 85* 79* 57* 41* 42* 44* 47*  CREATININE 7.54* 7.77* 6.19* 5.24* 5.37* 5.87* 6.44*  CALCIUM 5.9* 6.0* 7.1* 8.2* 8.2* 8.2* 7.8*  MG 1.6  --  2.7*  --   --   --   --   PHOS 5.5* 6.2* 5.3* 4.2  --  4.2 3.9    Albumin 2.4  Imaging/Diagnostic Tests:  Renal US 4/1: IMPRESSION:  Post right nephrectomy.  Fullness of collecting system at inferior pole of left kidney  without gross pelviectasis or evidence of obstruction.  FOBT neg x 2  CT abd/pelvis: IMPRESSION:  1. Previous right nephrectomy. No evidence of urolithiasis or  hydronephrosis.  2. Sigmoid diverticulosis. No radiographic evidence of  diverticulitis.  3. Tiny less than 1 cm calcified uterine fibroid.  4. Small bilateral pleural effusions, and bibasilar interstitial  infiltrates suspicious for pulmonary edema.  Chest x-ray 4/3: IMPRESSION:  1. Findings, as above, suggestive of mild to moderate congestive  heart failure.    Assessment and Plan: [name] [name] is a 62  y.o. female presenting with PMH of DM-2, muscle spasms, and emphysematous pylenephritis s/p right nephrectomy who presented with nausea/vomiting for approximately 2 weeks. On admission found to have acute kidney injury with associated metabolic acidosis and hyperkalemia, as well as intractable n/v possibly secondary to azotemia.   AKI  - Thought due most likely to nausea/vomiting preceding and during this admission leading to dehydration.  Renal US with full-appearing kidney with no sign of obstruction. - Consulted urology due to h/o previous emphysematous pyelonephritis, saw pt and signed off with no further rec or concern for obstruction.  - With no significant improvement with IV fluids, and fluid overload 4/3 per CXR, dialyzed 4/4 and 4/5. - UTI likely contributing, e coli pan-sensitive; continue CTX (abx 10-day course), - plan to transition to [place] today - Renal following and we greatly appreciate their recommendations.      - 4/5, 4/6 HD    - ANCA, anti GBM, ANA  Negative, urine eosinophils pending    - Will eval today for need of HD - Cre trending up at 6.4 - Continue calcium carbonate for low calcium   Hyperkalemia with mild T wave peaking on EKG,  - s/p kayexalate with resultant hypokalemia, dosed IV K x 1, resolved; normal Mg.  Metabolic acidosis - Thought to be likely from diarrhea. Corrected after IV fluids with bicarbonate   UTI  - Urine Cx resulted >100,000 cfu e coli, pan-sensitive  - continue CTX started 4/1 for 10 days course due to h/o emphysematous pyelo--> keflex today  Nausea/vomiting/diarrhea  - Seems improving for 24+ hourst, possibly due to hemodialysis. Thought to be cause of AKI.  - Unclear etiology, unlikely to be viral gastro, possibly azotemia with improvement after HD X2 - GI consulted - appreciate reccs  - Normal EGD  - Gatsric emptying study if continues - Protonix  - H pylori antibody- Pending - Zofran PRN, phenergan prn  DM-2 - A1c 6.1  - CBG's TID AC and QHS ,  - CBGs 103-181 - Sensitive SSI   Anemia - Iron panel with sat 23%, ferritin 495, folate 8.4; IV Fe per renal, FOBT neg x 2  - IV iron, If nephro Agrees, on aranesp - Will monitor closely during admission. Follow-up  - Transfuse for Hgb <7  Muscle spasms of back  - Discontinued flexeril (pt has not received in >24 hours) due to anticholinergic effects  - Tylenol PRN   Hypertension - 129-167/75-80, mostly above 409  systolic - BP continues to be high, 2/2 fluid overload v renal disease - Continue to monitor.   Diverticulosis  - No evidence of diverticulitis on CT. I - Monitor  CHD risk with DM Type II and HTN - continue low-dose aspirin  FEN/GI: SLIV; diet carb modified Prophylaxis: SCDs  Disposition: Pending clinical improvement in nausea and vomiting and further renal management for AKI   - f/u PT/OT recs Code Status: Full code   Kenn File, MD 02/21/2013, 6:49 AM

## 2013-02-21 NOTE — Progress Notes (Signed)
OT Cancellation NOTE I agree with the following treatment note after reviewing documentation.   Harrel Carina Nilwood   OTR/L Pager: [phone] Office: [phone] .

## 2013-02-21 NOTE — Progress Notes (Signed)
Seen and examined.  Bad day.  Vomited.  While I believe she had some element of uremia as cause of nausea, we will now treat as if she has added element of gastroparesis and begin reglan. Brief episode of A fib with RVR with vomiting.  Now in RSR with low BP.  Will stop diltiazem.  Sleepy - she wonders if hung over from sleeping pill. Rash on legs. Urticarial.  "I have sensitive skin and break out easily."

## 2013-02-21 NOTE — Progress Notes (Signed)
Patient has been in normal sinus since around 1pm just notified family practice resident about patient's BP and the fact it is low and renal wants to dialyses patient later today.

## 2013-02-21 NOTE — Progress Notes (Signed)
Family Medicine Teaching Service Daily Progress Note Service Page: [phone]  Subjective:  After symptoms began improving, [name] [name] has re-developed nausea/vomiting over the past 2 days and had an episode of a-fib yesterday with projectile vomiting, so was put on cardizem drip. This was stopped after she converted back to normal sinus rhythm and had decreased SBP to 100s during HD with no further episodes a-fib.  This morning, sleepy and per nurse no complaints of nausea/vomiting. Complaining of itching and headache.   Patient did not require any PRNs over 24 hours.  Objective: BP 123/55 (SBP 93-155) Pulse 63  Temp(Src) 99.4 F (37.4 C) (Oral)  Resp 19  Ht 5\' 5"  (1.651 m)  Wt 183 lb 13.8 oz (83.4 kg)  BMI 30.6 kg/m2  SpO2 96%  Exam: General: resting comfortably in bed, NAD Cardiovascular: RRR. No murmurs, rubs, or gallops.  Respiratory: Fine basilar crackles, poor effort. No wheezing appreciated. No increased work of breathing.  Abdomen: obese, soft, nondistended, NT, NABS.  Extremities: Trace LE edema, SCD not in place today, 2+ bilateral DP pulses  NEURO: Sleepy but answers questions appropriately when woken  HWE:XHBZ =513 in, 1125 out, net 612 out   I have reviewed the patient's medications, labs, imaging, and diagnostic testing.  Notable results are summarized below. Medications - No PRN over 24 hours as of 7 AM   Labs/Imaging -   Recent Labs Lab 02/20/13 0445 02/21/13 0522 02/22/13 0450  HGB 8.8* 8.9* 8.1*  HCT 27.7* 29.0* 26.0*  WBC 10.7* 11.8* 9.7  PLT 277 257 189    Recent Labs Lab 02/16/13 0502  02/18/13 0445 02/19/13 0747 02/19/13 1518 02/20/13 0445 02/21/13 0522 02/22/13 0450  NA 137  < > 133* 134* 133* 135 131* 137  K 3.1*  < > 4.1 4.4 4.8 4.4 4.6 3.7  CL 92*  < > 92* 93* 93* 95* 94* 100  CO2 29  < > 26 26 26 28 27 28   GLUCOSE 162*  < > 102* 95 126* 122* 121* 109*  BUN 85*  < > 57* 41* 42* 44* 47* 12  CREATININE 7.54*  < > 6.19* 5.24* 5.37*  5.87* 6.44* 2.51*  CALCIUM 5.9*  < > 7.1* 8.2* 8.2* 8.2* 7.8* 7.4*  MG 1.6  --  2.7*  --   --   --   --   --   PHOS 5.5*  < > 5.3* 4.2  --  4.2 3.9 2.2*  < > = values in this interval not displayed.  Imaging/Diagnostic Tests:  Autoimmune studies (AND, GBM Ab, myeloperoxidase abs, serine protease 3) WNL Alpha-1-globulin 10.2 Alpha-2-globulin 21.1 Beta-globulin  4.5, total protein ELP 5.7, albumin ELP 41.7 LFTs WNL, lipase 21 TSH 0.431 H pylori IgG 2.24  4-8 chest xray: IMPRESSION:  Suboptimal inspiration. Worsening interstitial pulmonary edema  since the examination 5 days ago. Probable small bilateral pleural  effusions.  Echocardiogram 4/8: Normal systolic function (EF 16-96%), trivial mitral regurgitation, mild tricuspid regurgitation   Assessment and Plan: [name] [name] is a 62 y.o. female presenting with PMH of DM-2, muscle spasms, and emphysematous pylenephritis s/p right nephrectomy who presented with nausea/vomiting for approximately 2 weeks. On admission found to have acute kidney injury with associated metabolic acidosis and hyperkalemia, as well as intractable n/v possibly secondary to azotemia.   # AKI - Likely prerenal due to nausea/vomiting. Creatinine improving with dialysis, 2.5 today. BP tenuous, and pt not on any antihypertensives. - UTI likely contributing, e coli pan-sensitive; continue CTX due  to poor PO (10-day course, last dose 4/9) - Renal following and we greatly appreciate their recommendations.      - 4/5, 4/6, 4/8 late pm HD    - ANCA, anti GBM, ANA  Negative, urine eosinophils pending  - Continue calcium carbonate for low calcium  - Echo ordered for fluid overload was WNL with mild regurgitation (Tricuspid and mitral); order IS to bedside - Pulled femoral cath yesterday so patient could be OOB and plan to place tunneled HD cath today  # Hyperkalemia with mild T wave peaking on EKG - s/p kayexalate with resultant hypokalemia, dosed IV K x 1, resolved;  normal Mg.  # Atrial fibrillation with RVR - Brief episode yesterday with vomiting, started diltiazem drip and pt came out of episode. With borderline low blood pressures, stopped diltiazem drip and pt stable with no further episodes.  # Metabolic acidosis - Thought to be likely from diarrhea. Corrected after IV fluids with bicarbonate   # UTI  - Urine Cx resulted >100,000 cfu e coli, pan-sensitive  - continue CTX started 4/1 for 10 days course due to h/o emphysematous pyelo--> keflex 4/8, last dose 4/11  # Nausea/vomiting/diarrhea - thought due to AKI/azotemia, and was improving with HD until yesterday when symptoms returned; now more strongly considering contribution of gastroparesis - GI consulted - appreciate reccs  - Normal EGD  - Gatsric emptying study - now now when pt feels less ill - Protonix, reglan (need endpoint in mind to avoid tardive dyskinesia) - H pylori IgG positive - will hold off on treatment as no EGD findings and do not want to muddy the waters with potential for c diff infection or GI symptoms with antibiotics if not needed - Zofran PRN, phenergan prn  # DM-2 - A1c 6.1; - CBG's TID AC and QHS  - Sensitive SSI   # Anemia - Iron panel with sat 23%, ferritin 495, folate 8.4; IV Fe per renal, FOBT neg x 2  - IV iron, If nephro Agrees, on aranesp - Will monitor closely during admission. - Transfuse for Hgb <7  # Muscle spasms of back  - Tylenol PRN; will refrain from flexeril due to anticholinergic effects   # Hypertension - 193-155/66-69 - BP continues to be tenuous, 2/2 fluid overload v renal disease and episode of atrial fibrillation yesterday - Continue to monitor.   # Diverticulosis with no evidence of diverticulitis on CT  # CHD risk with DM Type II and HTN - continue low-dose aspirin  # FEN/GI: SLIV; diet carb modified  # Prophylaxis: SCDs   # Disposition: Pending clinical improvement in nausea and vomiting and further renal management for AKI  -  f/u PT/OT recs  - Will remove foley.. Hoped for possible discharge tomorrow pending renal recs and able to tolerate PO but cannot due to AKI and continued need for HD. - Will c/s CM to find out if can get outpatient HD for AKI.  # Code Status: Full code   Conni Slipper, MD 02/22/2013, 9:48 AM

## 2013-02-21 NOTE — Progress Notes (Signed)
  Echocardiogram 2D Echocardiogram has been performed.  Ellender Hose A 02/21/2013, 2:59 PM

## 2013-02-21 NOTE — H&P (Signed)
Agree 

## 2013-02-21 NOTE — Progress Notes (Signed)
PT Cancellation Note  Patient Details Name: [name] [name] MRN: [id number] DOB: [birth year]   Cancelled Treatment:    Reason Eval/Treat Not Completed: Medical issues which prohibited therapy; pt with projectile vomiting and irregular HR per RN.  Will attempt at later date.   WYNN,CYNDI 02/21/2013, 10:23 AM

## 2013-02-21 NOTE — H&P (Signed)
[name] [name] is an 62 y.o. female.   Chief Complaint: pt with continual worsening renal function Presented N/V/D 3/31 Temp cath placed by nephrology Dialysis started 02/17/13 Need more permanent catheter for long term use Scheduled now for tunneled hemodialysis catheter 4/9 in IR HPI: R nephrectomy 2012- emphysematous nephritis; metabolic acidosis; hyperkalemia  Past Medical History  Diagnosis Date  . Hearing difficulty     Mild   . Diabetes mellitus without complication   . Night muscle spasms   . Bursitis   . Fatigue     Past Surgical History  Procedure Laterality Date  . 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 Esophagogastroduodenoscopy Left 02/20/2013    Procedure: ESOPHAGOGASTRODUODENOSCOPY (EGD);  Surgeon: Florencia Reasons, MD;  Location: [facility] ENDOSCOPY;  Service: Endoscopy;  Laterality: Left;    Family History  Problem Relation Age of Onset  . Heart disease    . Diabetes    . Stroke    . Bone cancer Father     Of jaws   . Throat cancer Father    Social History:  reports that she has never smoked. She does not have any smokeless tobacco history on file. She reports that she does not drink alcohol or use illicit drugs.  Allergies:  Allergies  Allergen Reactions  . Aleve (Naproxen Sodium)     Due to kidneys.  . Motrin (Ibuprofen)     Due to kidneys.    Medications Prior to Admission  Medication Sig Dispense Refill  . insulin glargine (LANTUS) 100 UNIT/ML injection Inject 20 Units into the skin daily.      . pseudoephedrine-acetaminophen (TYLENOL SINUS) 30-500 MG TABS Take 1 tablet by mouth every 4 (four) hours as needed (for sinus drainage).        Results for orders placed during the hospital encounter of 02/13/13 (from the past 48 hour(s))  COMPREHENSIVE METABOLIC PANEL     Status: Abnormal   Collection Time    02/19/13  3:18 PM      Result Value Range   Sodium 133 (*) 135 - 145 mEq/L   Potassium 4.8  3.5 - 5.1 mEq/L   Chloride  93 (*) 96 - 112 mEq/L   CO2 26  19 - 32 mEq/L   Glucose, Bld 126 (*) 70 - 99 mg/dL   BUN 42 (*) 6 - 23 mg/dL   Creatinine, Ser 8.29 (*) 0.50 - 1.10 mg/dL   Calcium 8.2 (*) 8.4 - 10.5 mg/dL   Total Protein 6.7  6.0 - 8.3 g/dL   Albumin 2.6 (*) 3.5 - 5.2 g/dL   AST 10  0 - 37 U/L   ALT <5  0 - 35 U/L   Alkaline Phosphatase 116  39 - 117 U/L   Total Bilirubin 0.2 (*) 0.3 - 1.2 mg/dL   GFR calc non Af Amer 8 (*) >90 mL/min   GFR calc Af Amer 9 (*) >90 mL/min   Comment:            The eGFR has been calculated     using the CKD EPI equation.     This calculation has not been     validated in all clinical     situations.     eGFR's persistently     <90 mL/min signify     possible Chronic Kidney Disease.  GLUCOSE, CAPILLARY     Status: Abnormal   Collection Time    02/19/13  4:41 PM  Result Value Range   Glucose-Capillary 123 (*) 70 - 99 mg/dL   Comment 1 Notify RN    GLUCOSE, CAPILLARY     Status: Abnormal   Collection Time    02/19/13  9:51 PM      Result Value Range   Glucose-Capillary 143 (*) 70 - 99 mg/dL   Comment 1 Documented in Chart     Comment 2 Notify RN    RENAL FUNCTION PANEL     Status: Abnormal   Collection Time    02/20/13  4:45 AM      Result Value Range   Sodium 135  135 - 145 mEq/L   Potassium 4.4  3.5 - 5.1 mEq/L   Chloride 95 (*) 96 - 112 mEq/L   CO2 28  19 - 32 mEq/L   Glucose, Bld 122 (*) 70 - 99 mg/dL   BUN 44 (*) 6 - 23 mg/dL   Creatinine, Ser 0.45 (*) 0.50 - 1.10 mg/dL   Calcium 8.2 (*) 8.4 - 10.5 mg/dL   Phosphorus 4.2  2.3 - 4.6 mg/dL   Albumin 2.4 (*) 3.5 - 5.2 g/dL   GFR calc non Af Amer 7 (*) >90 mL/min   GFR calc Af Amer 8 (*) >90 mL/min   Comment:            The eGFR has been calculated     using the CKD EPI equation.     This calculation has not been     validated in all clinical     situations.     eGFR's persistently     <90 mL/min signify     possible Chronic Kidney Disease.  CBC     Status: Abnormal   Collection Time     02/20/13  4:45 AM      Result Value Range   WBC 10.7 (*) 4.0 - 10.5 K/uL   RBC 2.96 (*) 3.87 - 5.11 MIL/uL   Hemoglobin 8.8 (*) 12.0 - 15.0 g/dL   HCT 40.9 (*) 81.1 - 91.4 %   MCV 93.6  78.0 - 100.0 fL   MCH 29.7  26.0 - 34.0 pg   MCHC 31.8  30.0 - 36.0 g/dL   RDW 78.2  95.6 - 21.3 %   Platelets 277  150 - 400 K/uL  GLUCOSE, CAPILLARY     Status: Abnormal   Collection Time    02/20/13  7:50 AM      Result Value Range   Glucose-Capillary 116 (*) 70 - 99 mg/dL  GLUCOSE, CAPILLARY     Status: Abnormal   Collection Time    02/20/13 12:52 PM      Result Value Range   Glucose-Capillary 103 (*) 70 - 99 mg/dL  LIPASE, BLOOD     Status: None   Collection Time    02/20/13  3:36 PM      Result Value Range   Lipase 21  11 - 59 U/L  GLUCOSE, CAPILLARY     Status: Abnormal   Collection Time    02/20/13  4:45 PM      Result Value Range   Glucose-Capillary 181 (*) 70 - 99 mg/dL   Comment 1 Notify RN     Comment 2 Documented in Chart    GLUCOSE, CAPILLARY     Status: Abnormal   Collection Time    02/20/13  9:28 PM      Result Value Range   Glucose-Capillary 138 (*) 70 - 99 mg/dL  RENAL FUNCTION  PANEL     Status: Abnormal   Collection Time    02/21/13  5:22 AM      Result Value Range   Sodium 131 (*) 135 - 145 mEq/L   Potassium 4.6  3.5 - 5.1 mEq/L   Chloride 94 (*) 96 - 112 mEq/L   CO2 27  19 - 32 mEq/L   Glucose, Bld 121 (*) 70 - 99 mg/dL   BUN 47 (*) 6 - 23 mg/dL   Creatinine, Ser 1.19 (*) 0.50 - 1.10 mg/dL   Calcium 7.8 (*) 8.4 - 10.5 mg/dL   Phosphorus 3.9  2.3 - 4.6 mg/dL   Albumin 2.3 (*) 3.5 - 5.2 g/dL   GFR calc non Af Amer 6 (*) >90 mL/min   GFR calc Af Amer 7 (*) >90 mL/min   Comment:            The eGFR has been calculated     using the CKD EPI equation.     This calculation has not been     validated in all clinical     situations.     eGFR's persistently     <90 mL/min signify     possible Chronic Kidney Disease.  CBC     Status: Abnormal   Collection Time     02/21/13  5:22 AM      Result Value Range   WBC 11.8 (*) 4.0 - 10.5 K/uL   RBC 3.06 (*) 3.87 - 5.11 MIL/uL   Hemoglobin 8.9 (*) 12.0 - 15.0 g/dL   HCT 14.7 (*) 82.9 - 56.2 %   MCV 94.8  78.0 - 100.0 fL   MCH 29.1  26.0 - 34.0 pg   MCHC 30.7  30.0 - 36.0 g/dL   RDW 13.0  86.5 - 78.4 %   Platelets 257  150 - 400 K/uL  GLUCOSE, CAPILLARY     Status: Abnormal   Collection Time    02/21/13  7:07 AM      Result Value Range   Glucose-Capillary 128 (*) 70 - 99 mg/dL   Comment 1 Notify RN     Comment 2 Documented in Chart    GLUCOSE, CAPILLARY     Status: Abnormal   Collection Time    02/21/13 11:49 AM      Result Value Range   Glucose-Capillary 152 (*) 70 - 99 mg/dL   Comment 1 Notify RN     Comment 2 Documented in Chart     Dg Chest Port 1 View  02/21/2013  *RADIOLOGY REPORT*  Clinical Data: Cough.  PORTABLE CHEST - 1 VIEW 02/21/2013 1138 hours:  Comparison: Two-view chest x-ray 02/16/2013, 03/15/2011.  Portable chest x-ray 02/13/2013, 03/12/2011.  Findings: Suboptimal inspiration accounts for crowded bronchovascular markings, especially in the lung bases, and accentuates the cardiac silhouette.  Taking this into account, cardiac silhouette mildly enlarged but stable.  Mild diffuse interstitial pulmonary edema.  Small bilateral pleural effusions suspected.  Interval slight worsening since the examination 5 days ago.  IMPRESSION: Suboptimal inspiration.  Worsening interstitial pulmonary edema since the examination 5 days ago.  Probable small bilateral pleural effusions.   Original Report Authenticated By: Hulan Saas, M.D.     Review of Systems  Constitutional: Negative for fever and weight loss.  Respiratory: Negative for cough.   Cardiovascular: Negative for chest pain.  Gastrointestinal: Positive for nausea and vomiting.  Neurological: Positive for weakness.    Blood pressure 143/65, pulse 69, temperature 100.2 F (37.9 C), temperature source  Oral, resp. rate 18, height 5\' 5"   (1.651 m), weight 182 lb 5.1 oz (82.7 kg), SpO2 97.00%. Physical Exam  Constitutional: She is oriented to person, place, and time. She appears well-nourished.  Cardiovascular: Normal rate and regular rhythm.   No murmur heard. Respiratory: Effort normal. She has no wheezes.  GI: Soft. Bowel sounds are normal. There is no tenderness.  Musculoskeletal: Normal range of motion.  But very sleepy  Neurological: She is alert and oriented to person, place, and time.  Oriented to time; place; name;dob; reason for exam  Skin: Skin is warm.  Psychiatric: She has a normal mood and affect. Her behavior is normal. Judgment and thought content normal.     Assessment/Plan Worsening renal fxn Temp cath placed 3/31- in use since 4/4 Need more permanent access per Dr Eliott Nine Scheduled for tunneled HD cath in am Pt aware of procedure benefits and risks and agreeable to proceed consent signed and in chart  Emonnie Cannady A 02/21/2013, 12:47 PM

## 2013-02-21 NOTE — Progress Notes (Signed)
While attempting to get this patient to of bed to the chair patient suddenly had projectile vomiting which was mainly undigested food. Also patient has gone into Afib. Color is extremely pale. Bowel sounds are hypoactive at present. Have notified physician of episode. Physician in to see patient and has placed new orders.

## 2013-02-21 NOTE — Progress Notes (Signed)
EGD findings from yesterday reviewed with patient.  Unfortunately, after having had a good day 2 days ago, the patient is now symptomatic again, with vomiting earlier today and some residual nausea now.  It remains to be seen to what degree her nausea is due to gastric dysmotility, versus uremia , as per Dr. Cyndia Skeeters note. Agree with trial of Reglan; the patient is not currently up to undergoing a gastric emptying scan.  We will follow at distance; please call us if earlier input would be helpful.  [name] [name], M.D. [phone]

## 2013-02-21 NOTE — Progress Notes (Signed)
[facility]  Subjective:  Episode of projectile vomiting of undigested food earlier today Reglan restarted 5 IV Qh^ No anatomic reason for N/V seen on EGD Gastroparesis still felt likely Dialyzed 4/5 but treatment shortened due to low BP (lowest 83/34) Creatinine continues to rise in interdialytic interval Went into Afib earlier - now on dilt drip  Objective: Vital signs in last 24 hours: Blood pressure 143/65, pulse 69, temperature 99.6 F (37.6 C), temperature source Axillary, resp. rate 18, height 5\' 5"  (1.651 m), weight 82.7 kg (182 lb 5.1 oz), SpO2 97.00%.  PHYSICAL EXAM General--as above  Very hard of hearing Chest--clear Heart--no rub  Abd--nontender  Extr--no edema, HD cath in R femoral vein (4/4)  Date   Weight 1 Apr  83.3  4 Apr  88.9 5 Apr  85.3 pre HD 5 Apr  83.7 post HD 7 Apr  None recorded 8 Apr  82.7 kg I/O last 3 completed shifts: In: 1233 [P.O.:980; I.V.:203; IV Piggyback:50] Out: 8119 [Urine:1550] Total I/O In: 10 [I.V.:10] Out: 350 [Urine:350]  Lab Results:  Recent Labs Lab 02/19/13 0747 02/19/13 1518 02/20/13 0445 02/21/13 0522  NA 134* 133* 135 131*  K 4.4 4.8 4.4 4.6  CL 93* 93* 95* 94*  CO2 26 26 28 27   BUN 41* 42* 44* 47*  CREATININE 5.24* 5.37* 5.87* 6.44*  GLUCOSE 95 126* 122* 121*  CALCIUM 8.2* 8.2* 8.2* 7.8*  PHOS 4.2  --  4.2 3.9     Recent Labs  02/20/13 0445 02/21/13 0522  WBC 10.7* 11.8*  HGB 8.8* 8.9*  HCT 27.7* 29.0*  PLT 277 257   Results for [name] [name], [name] [name] (MRN [id number]) as of 02/20/2013 15:11  Ref. Range 02/18/2013 11:33  ANA Latest Range: NEGATIVE  NEGATIVE  GBM Ab Latest Range: <20 AU/mL <1  Myeloperoxidase Abs Latest Range: <20 AU/mL <1  Serine Protease 3 Latest Range: <20 AU/mL <1   Results for [name] [name], [name] [name] (MRN [id number]) as of 02/20/2013 15:11  Ref. Range 02/17/2013 04:04 02/17/2013 16:37  Hepatitis B Surface Ag Latest Range: NEGATIVE  NEGATIVE   Hep B S Ab Latest Range: NONREACTIVE   NONREACTIVE   Hep B Core Total Ab Latest Range: NEGATIVE  NEGATIVE   H Pylori IgG No range found  2.24 (H)   I have reviewed the patient's current medications. Scheduled: . aspirin EC  81 mg Oral Daily  . benzonatate  200 mg Oral TID  . calcium carbonate  1 tablet Oral TID WC  . cefTRIAXone (ROCEPHIN)  IV  1 g Intravenous Q24H  . darbepoetin (ARANESP) injection - NON-DIALYSIS  100 mcg Subcutaneous Q Wed-1800  . heparin lock flush  2,800 Units Intravenous Once  . insulin aspart  0-9 Units Subcutaneous TID WC  . metoCLOPramide (REGLAN) injection  5 mg Intravenous Q6H  . pantoprazole  40 mg Oral Daily  . sodium chloride  3 mL Intravenous Q12H  . traZODone  50 mg Oral QHS   . diltiazem (CARDIZEM) infusion 5 mg/hr (02/21/13 1042)    Assessment/Plan: Pt is a 62 y.o. yo female with a PMHX of DM II, with prior history emphysematous pyelonephritis-- s/p right nephrectomy in 2012, was admitted to [facility] on 02/13/2013 with nausea, vomiting, diarrhea for 2 weeks resulting in AKI with associated metabolic acidosis and hyperkalemia.   Initially felt d/t hypovolemia with  BUN 101, Cr 8.42.    Baseline Cr from 03/20/11-09/14/11 trended from 0.51-0.93, but no labs in interim PTA, and has not responded to  IV fluid hydration, requiring initiation of HD 02/17/13  1. Acute Kidney Injury Past hx R nephrectomy for emphysematous pyelo Kidney normal size by ultrasound GU not suspicious of anything acc to Dr. McDiarmid's note.   Urine EOS--pending,  ANA, ANCA and antiGBM all negative IVF did not reverse potential prerenal problem.  CXR looked wet on 3 Apr.  Dialyzed on 4 and 5 Apr.  Vomiting no better with dialysis  HD cath still in R  Fem vein (4/4). Creatinine continues to rise in interdialytic interval Will go ahead with HD today then pull fem cath (so pt can be OOB) Replace with [facility] tomorrow  Etiology of her AKI still not readily apparent to me (assuming that it is acute)  2.  Hyperkalemia--corrected  3. Metabolic Acidosis - acidosis corrected   4. Anemia-- FOBT neg. Fe panel sat 23%, ferritin 495, folate 8.4.  On IV Fe and Aranesp. Transfuse for hgb <7. Hgb 8.9 today   5. HTN/Vol -- BP ok today. Urine output 1500 cc past 24 hr.   6. Vomiting-- this has persisted since admission. On protonix.  EGD negative IS H pylori positive Felt gastroparesis Back on IV Reglan  7. UTI---E coli, sens pending, on ceftriaxone.       LOS: 8 days   Devery Murgia B 02/21/2013,11:42 AM

## 2013-02-22 ENCOUNTER — Inpatient Hospital Stay (HOSPITAL_COMMUNITY): Payer: Medicaid Other

## 2013-02-22 DIAGNOSIS — I10 Essential (primary) hypertension: Secondary | ICD-10-CM

## 2013-02-22 DIAGNOSIS — E8779 Other fluid overload: Secondary | ICD-10-CM

## 2013-02-22 LAB — RENAL FUNCTION PANEL
Albumin: 2.2 g/dL — ABNORMAL LOW (ref 3.5–5.2)
Calcium: 7.4 mg/dL — ABNORMAL LOW (ref 8.4–10.5)
Chloride: 100 mEq/L (ref 96–112)
Creatinine, Ser: 2.51 mg/dL — ABNORMAL HIGH (ref 0.50–1.10)
GFR calc Af Amer: 22 mL/min — ABNORMAL LOW (ref >90)
GFR calc non Af Amer: 19 mL/min — ABNORMAL LOW (ref >90)
Sodium: 137 mEq/L (ref 135–145)

## 2013-02-22 LAB — GLUCOSE, CAPILLARY
Glucose-Capillary: 108 mg/dL — ABNORMAL HIGH (ref 70–99)
Glucose-Capillary: 153 mg/dL — ABNORMAL HIGH (ref 70–99)

## 2013-02-22 LAB — CBC
MCH: 29.9 pg (ref 26.0–34.0)
MCHC: 31.2 g/dL (ref 30.0–36.0)
Platelets: 189 10*3/uL (ref 150–400)
RDW: 14.6 % (ref 11.5–15.5)

## 2013-02-22 MED ORDER — TECHNETIUM TC 99M SULFUR COLLOID
2.0000 | Freq: Once | INTRAVENOUS | Status: AC | PRN
  Administered 2013-02-22: 2 via INTRAVENOUS

## 2013-02-22 MED ORDER — BIOTENE DRY MOUTH MT LIQD
15.0000 mL | Freq: Two times a day (BID) | OROMUCOSAL | Status: DC
  Administered 2013-02-22 – 2013-03-08 (×25): 15 mL via OROMUCOSAL

## 2013-02-22 MED ORDER — CEFAZOLIN SODIUM-DEXTROSE 2-3 GM-% IV SOLR
2.0000 g | INTRAVENOUS | Status: AC
  Filled 2013-02-22 (×3): qty 50

## 2013-02-22 MED ORDER — METOCLOPRAMIDE HCL 5 MG PO TABS
5.0000 mg | ORAL_TABLET | Freq: Three times a day (TID) | ORAL | Status: DC
  Administered 2013-02-22 – 2013-02-26 (×12): 5 mg via ORAL
  Filled 2013-02-22 (×16): qty 1

## 2013-02-22 NOTE — Progress Notes (Signed)
OT Cancellation Note  Patient Details Name: [name] [name] MRN: [id number] DOB: 10/14/1951   Cancelled Treatment:    Reason Eval/Treat Not Completed: Other (comment) (Fem cath removed and HD cath placement pt unable at this time)  Lucile Shutters Pager: [phone]  02/22/2013, 2:45 PM

## 2013-02-22 NOTE — Progress Notes (Addendum)
[facility]  Subjective: Dialysis yesterday without any major issues via fem cath which was removed this AM No volume removed (intentionally) Down in radiology for gastric emptying study ?) and IR placement of permcath Labs and chart reviewed RN reports no further vomiting overnight (but was NPO) Has been switched to PO reglan  Objective: Vital signs in last 24 hours: Blood pressure 123/55, pulse 63, temperature 99.4 F (37.4 C), temperature source Oral, resp. rate 19, height 5\' 5"  (1.651 m), weight 83.4 kg (183 lb 13.8 oz), SpO2 96.00%.  PHYSICAL EXAM Will examine upon return to the floor from radiology Very hard of hearing  Chest--clear  Heart--no rub  Abd--nontender  Extr--no edema   I/O last 3 completed shifts: In: 39 [P.O.:620; I.V.:13] Out: 1600 [Urine:1600]   Weights: 02/22/13 0303 83.5 kg   02/22/13 0228 83 kg  02/21/13 2240 82.8 kg  02/21/13 0800 82.7 kg  02/18/13 2135 83.7 kg  02/18/13 1930 85.3 kg   02/17/13 1604 86.6 kg 02/17/13 1145 88.9 kg  02/16/13 0715 87.9 kg  02/14/13 0030 83.3 kg   Lab Results:  Recent Labs Lab 02/20/13 0445 02/21/13 0522 02/22/13 0450  NA 135 131* 137  K 4.4 4.6 3.7  CL 95* 94* 100  CO2 28 27 28   BUN 44* 47* 12  CREATININE 5.87* 6.44* 2.51*  GLUCOSE 122* 121* 109*  CALCIUM 8.2* 7.8* 7.4*  PHOS 4.2 3.9 2.2*     Recent Labs  02/21/13 0522 02/22/13 0450  WBC 11.8* 9.7  HGB 8.9* 8.1*  HCT 29.0* 26.0*  PLT 257 189   Results for [name] [name], [name] [name] (MRN [id number]) as of 02/20/2013 15:11  Ref. Range 02/18/2013 11:33  ANA Latest Range: NEGATIVE  NEGATIVE  GBM Ab Latest Range: <20 AU/mL <1  Myeloperoxidase Abs Latest Range: <20 AU/mL <1  Serine Protease 3 Latest Range: <20 AU/mL <1   Results for [name] [name], [name] [name] (MRN [id number]) as of 02/20/2013 15:11  Ref. Range 02/17/2013 04:04 02/17/2013 16:37  Hepatitis B Surface Ag Latest Range: NEGATIVE  NEGATIVE   Hep B S Ab Latest Range: NONREACTIVE  NONREACTIVE   Hep B  Core Total Ab Latest Range: NEGATIVE  NEGATIVE   H Pylori IgG No range found  2.24 (H)   Results for [name] [name], [name] [name] (MRN [id number]) as of 02/22/2013 12:28  Ref. Range 02/14/2013 04:49  Iron Latest Range: 42-135 ug/dL 31 (L)  UIBC Latest Range: 125-400 ug/dL 106 (L)  TIBC Latest Range: 250-470 ug/dL 137 (L)  Saturation Ratios Latest Range: 20-55 % 23  Ferritin Latest Range: 10-291 ng/mL 495 (H)  Folate No range found 8.4   I have reviewed the patient's current medications. Scheduled: . antiseptic oral rinse  15 mL Mouth Rinse BID  . aspirin EC  81 mg Oral Daily  . calcium carbonate  1 tablet Oral TID WC  . cefTRIAXone (ROCEPHIN)  IV  1 g Intravenous Q24H  . darbepoetin (ARANESP) injection - NON-DIALYSIS  100 mcg Subcutaneous Q Wed-1800  . heparin lock flush  2,800 Units Intravenous Once  . insulin aspart  0-9 Units Subcutaneous TID WC  . loratadine  10 mg Oral Daily  . metoCLOPramide  5 mg Oral TID AC  . pantoprazole  40 mg Oral Daily  . sodium chloride  3 mL Intravenous Q12H      Assessment/Plan: Pt is a 62 y.o. yo female with a PMHX of DM II, with prior history emphysematous pyelonephritis-- s/p right nephrectomy in 2012, was admitted to  [place] on 02/13/2013 with nausea, vomiting, diarrhea for 2 weeks resulting in AKI with associated metabolic acidosis and hyperkalemia.   Initially felt d/t hypovolemia with  BUN 101, Cr 8.42.    Baseline Cr from 03/20/11-09/14/11 trended from 0.51-0.93, but no labs in interim PTA, and did not respond to IV fluid hydration, requiring initiation of HD 02/17/13  Acute Kidney Injury in pt with solitary kidney Past hx R nephrectomy for emphysematous pyelo Kidney normal size by ultrasound GU not suspicious of anything acc to Dr. McDiarmid's note.  Urine EOS--still pending (unclear that lab ever received),  ANA, ANCA and antiGBM all negative IVF did not reverse potential prerenal problem.  CXR looked wet on 3 Apr.  HD dep since 4/4 Fem cath (4/4) removed  4/9 For permcath today then prn HD until recovers (or not)--->postponed until 4/10 Cannot be discharged until clearly recovers or not (has no means of payment for outpt HD at present even if we declared her ESRD which we cannot do at this stage as she is "medicaid pending") So will have to remain in house for observation and prn HD until course declares itself Etiology of her AKI still not readily apparent to me (assuming that it is acute) unless just slow to resolve ATN in her solitary kidney  Hyperkalemia--corrected  Metabolic Acidosis - corrected  Anemia FOBT neg.  Fe panel sat 23%, ferritin 495, folate 8.4.  On Aranesp 100/week Received Feraheme  510 IV on 4/2.  HTN/Vol -- BP ok today.  Vomiting On protonix.  EGD negative IS H pylori positive but no plan to Rx since no ulcers etc seen on EGD Felt gastroparesis On Reglan Gast emptying study pending  UTI on ceftriaxone.  E Coli     LOS: 9 days   [name] [name] B 02/22/2013,12:25 PM

## 2013-02-22 NOTE — Progress Notes (Signed)
Patient ID: [name] [name], female   DOB: [birth year], 62 y.o.   MRN: [id number] HD cath placement (with IV conscious sedation) on pt will be postponed until 4/10 secondary to pt receiving NM gastric emptying study earlier today. Nurse aware and new orders placed.

## 2013-02-22 NOTE — Progress Notes (Signed)
R femoral HD catheter removed by IV team.  Pressure held for 30 minutes. No signs of bleeding noted.  Site level 0.  Pt instructed to lay still for 1 hour.  Will continue to monitor.    Maximino Greenland RN

## 2013-02-22 NOTE — Progress Notes (Signed)
Down to xray in bed for placement of HD catheter.

## 2013-02-22 NOTE — Progress Notes (Signed)
Physical Therapy Treatment Patient Details Name: [name] [name] MRN: [id number] DOB: 1951/06/01 Today's Date: 02/22/2013 Time: 1308-6578 PT Time Calculation (min): 26 min  PT Assessment / Plan / Recommendation Comments on Treatment Session  Patient demonstrates more weakness and increased assist for mobility today.  Has been difficult to see due to N/V and HD so first PT session in one week.  Will keep recommendation for HHPT at this time, but may need updated recs if still this weak next session.    Follow Up Recommendations  Home health PT           Equipment Recommendations  Rolling walker with 5" wheels       Frequency Min 3X/week   Plan Discharge plan remains appropriate    Precautions / Restrictions Precautions Precautions: Fall   Pertinent Vitals/Pain Denies pain     Mobility  Bed Mobility Supine to Sit: HOB elevated;5: Supervision Sitting - Scoot to Edge of Bed: 5: Supervision Sit to Supine: HOB flat Details for Bed Mobility Assistance: increased reliance on rails and assist due to fatige Transfers Sit to Stand: 4: Min guard;From bed;From chair/3-in-1 Stand to Sit: To chair/3-in-1;To bed;4: Min assist Details for Transfer Assistance: cues for safety to back up all the way with walker to bed; sat in chair during ambulation due to c/o weak and shakey Ambulation/Gait Ambulation/Gait Assistance: 4: Min guard Ambulation Distance (Feet): 70 Feet Assistive device: Rolling walker Ambulation/Gait Assistance Details: Needed assist for safety, demonstated decreased speed, increased trunk flexion and needed seated rest during ambulation due to c/o fatigue/weakness Gait Pattern: Decreased stride length;Step-through pattern;Shuffle      PT Goals Acute Rehab PT Goals Pt will go Sit to Stand: with modified independence PT Goal: Sit to Stand - Progress: Progressing toward goal Pt will go Stand to Sit: with modified independence PT Goal: Stand to Sit - Progress:  Progressing toward goal Pt will Ambulate: >150 feet;with modified independence;with least restrictive assistive device PT Goal: Ambulate - Progress: Not progressing (due to sick for couple of days and no PT x 1 week)  Visit Information  Last PT Received On: 02/22/13    Subjective Data  Subjective: Just back from nuclear medicine and had dialysis till 3 am, really tired, but I will try cause want to go home soon.   Cognition  Cognition Overall Cognitive Status: Appears within functional limits for tasks assessed/performed Arousal/Alertness: Awake/alert Orientation Level: Appears intact for tasks assessed Behavior During Session: [facility] for tasks performed Cognition - Other Comments: pt HOH so have to speak loudly    Balance  Static Standing Balance Static Standing - Balance Support: Bilateral upper extremity supported Static Standing - Level of Assistance: 5: Stand by assistance  End of Session PT - End of Session Equipment Utilized During Treatment: Gait belt Activity Tolerance: Patient limited by fatigue Patient left: in bed;with call bell/phone within reach;with family/visitor present   GP     [facility] 02/22/2013, 4:29 PM Sheran Lawless, PT [phone] 02/22/2013

## 2013-02-22 NOTE — Progress Notes (Signed)
Seen and examined.  Less nausea.  Moderate delayed gastric emptying.  Acute renal failure continues to need dialysis.  So, despite the fact that she is making good progress in other realms, will need to remain inpatient for this dialysis.  Transfer to floor - tele because of single bout of a fib.

## 2013-02-23 ENCOUNTER — Inpatient Hospital Stay (HOSPITAL_COMMUNITY): Payer: Medicaid Other

## 2013-02-23 LAB — RENAL FUNCTION PANEL
Albumin: 2.3 g/dL — ABNORMAL LOW (ref 3.5–5.2)
BUN: 16 mg/dL (ref 6–23)
GFR calc non Af Amer: 12 mL/min — ABNORMAL LOW (ref >90)
Phosphorus: 2.9 mg/dL (ref 2.3–4.6)
Potassium: 3.5 mEq/L (ref 3.5–5.1)
Sodium: 133 mEq/L — ABNORMAL LOW (ref 135–145)

## 2013-02-23 LAB — CBC
HCT: 25.6 % — ABNORMAL LOW (ref 36.0–46.0)
MCHC: 31.3 g/dL (ref 30.0–36.0)
Platelets: 197 10*3/uL (ref 150–400)
RDW: 14.9 % (ref 11.5–15.5)
WBC: 9.9 10*3/uL (ref 4.0–10.5)

## 2013-02-23 LAB — GLUCOSE, CAPILLARY
Glucose-Capillary: 104 mg/dL — ABNORMAL HIGH (ref 70–99)
Glucose-Capillary: 116 mg/dL — ABNORMAL HIGH (ref 70–99)
Glucose-Capillary: 110 mg/dL — ABNORMAL HIGH (ref 70–99)

## 2013-02-23 MED ORDER — DIPHENHYDRAMINE HCL 25 MG PO CAPS
25.0000 mg | ORAL_CAPSULE | Freq: Three times a day (TID) | ORAL | Status: DC | PRN
  Administered 2013-02-23: 25 mg via ORAL
  Filled 2013-02-23: qty 1

## 2013-02-23 MED ORDER — FENTANYL CITRATE 0.05 MG/ML IJ SOLN
INTRAMUSCULAR | Status: AC | PRN
  Administered 2013-02-23: 50 ug via INTRAVENOUS

## 2013-02-23 MED ORDER — MIDAZOLAM HCL 2 MG/2ML IJ SOLN
INTRAMUSCULAR | Status: AC | PRN
  Administered 2013-02-23: 1 mg via INTRAVENOUS

## 2013-02-23 NOTE — Progress Notes (Signed)
Subjective:  Just back from permcath placement Reports dyspnea when she moves around/exerts No nausea this AM  Objective:   Vital signs in last 24 hours: Filed Vitals:   02/23/13 0828 02/23/13 0845 02/23/13 0850 02/23/13 0855  BP: 125/59 148/68 128/66 133/84  Pulse: 62 78 63 58  Temp:      TempSrc:      Resp: 15 23 17 16   Height:      Weight:      SpO2: 90% 98% 95% 96%   Weight change: 0.7 kg (1 lb 8.7 oz)  Intake/Output Summary (Last 24 hours) at 02/23/13 0858 Last data filed at 02/23/13 0046  Gross per 24 hour  Intake      0 ml  Output    400 ml  Net   -400 ml   Weights:  02/22/13 0303 83.5 kg  02/22/13 0228 83 kg  02/21/13 2240 82.8 kg  02/21/13 0800 82.7 kg  02/18/13 2135 83.7 kg  02/18/13 1930 85.3 kg  02/17/13 1604 86.6 kg  02/17/13 1145 88.9 kg  02/16/13 0715 87.9 kg  02/14/13 0030 83.3 kg   Physical Exam:  Blood pressure 133/84, pulse 58, temperature 98.9 F (37.2 C), temperature source Oral, resp. rate 16, height 5\' 5"  (1.651 m), weight 83.4 kg (183 lb 13.8 oz), SpO2 96.00%. Hard of hearing NAD Few base crackles on lung exam S1S2 no S3 Abdomen soft and non tender No substantial LE edema   Recent Labs Lab 02/17/13 0454 02/18/13 0445 02/19/13 0747 02/19/13 1518 02/20/13 0445 02/21/13 0522 02/22/13 0450 02/23/13 0500  NA 134* 133* 134* 133* 135 131* 137 133*  K 3.7 4.1 4.4 4.8 4.4 4.6 3.7 3.5  CL 89* 92* 93* 93* 95* 94* 100 97  CO2 30 26 26 26 28 27 28 28   GLUCOSE 106* 102* 95 126* 122* 121* 109* 102*  BUN 79* 57* 41* 42* 44* 47* 12 16  CREATININE 7.77* 6.19* 5.24* 5.37* 5.87* 6.44* 2.51* 3.74*  CALCIUM 6.0* 7.1* 8.2* 8.2* 8.2* 7.8* 7.4* 7.6*  PHOS 6.2* 5.3* 4.2  --  4.2 3.9 2.2* 2.9    Recent Labs Lab 02/19/13 1518  02/21/13 0522 02/22/13 0450 02/23/13 0500  AST 10  --   --   --   --   ALT <5  --   --   --   --   ALKPHOS 116  --   --   --   --   BILITOT 0.2*  --   --   --   --   PROT 6.7  --   --   --   --   ALBUMIN 2.6*  < > 2.3*  2.2* 2.3*  < > = values in this interval not displayed.  Recent Labs Lab 02/20/13 1536  LIPASE 21   Recent Labs Lab 02/20/13 0445 02/21/13 0522 02/22/13 0450 02/23/13 0500  WBC 10.7* 11.8* 9.7 9.9  HGB 8.8* 8.9* 8.1* 8.0*  HCT 27.7* 29.0* 26.0* 25.6*  MCV 93.6 94.8 95.9 95.9  PLT 277 257 189 197    Recent Labs Lab 02/22/13 0618 02/22/13 0718 02/22/13 1618 02/22/13 2150 02/23/13 0801  GLUCAP 94 108* 153* 121* 104*  Results for TYANA, BUTZER (MRN [id number]) as of 02/20/2013 15:11   Ref. Range  02/18/2013 11:33   ANA  Latest Range: NEGATIVE  NEGATIVE   GBM Ab  Latest Range: <20 AU/mL  <1   Myeloperoxidase Abs  Latest Range: <20 AU/mL  <1   Serine  Protease 3  Latest Range: <20 AU/mL  <1   Results for FAVOUR, ALESHIRE (MRN [id number]) as of 02/20/2013 15:11   Ref. Range  02/17/2013 04:04  02/17/2013 16:37   Hepatitis B Surface Ag  Latest Range: NEGATIVE  NEGATIVE    Hep B S Ab  Latest Range: NONREACTIVE  NONREACTIVE    Hep B Core Total Ab  Latest Range: NEGATIVE  NEGATIVE    H Pylori IgG  No range found   2.24 (H)   Results for LEVON, PENNING (MRN [id number]) as of 02/22/2013 12:28   Ref. Range  02/14/2013 04:49   Iron  Latest Range: 42-135 ug/dL  31 (L)   UIBC  Latest Range: 125-400 ug/dL  106 (L)   TIBC  Latest Range: 250-470 ug/dL  137 (L)   Saturation Ratios  Latest Range: 20-55 %  23   Ferritin  Latest Range: 10-291 ng/mL  495 (H)   Folate  No range found  8.4     Studies/Results: Nm Gastric Emptying  02/22/2013  *RADIOLOGY REPORT*  Clinical Data:  Abdominal pain with persistent nausea and projectile vomiting.  NUCLEAR MEDICINE GASTRIC EMPTYING SCAN  Technique:  After oral ingestion of radiolabeled meal, sequential abdominal images were obtained for 120 minutes.  Residual percentage of activity remaining within the stomach was calculated at 60 and 120 minutes.  Radiopharmaceutical: 2 mCi Tc-87m Sulfur Colloid cooked in one egg.  Comparison: Abdominal CT 02/15/2013.  Findings:  Anterior images of the abdomen demonstrate moderately delayed emptying of the stomach.  Calculated retention in the stomach is 65% at both one and two hours.  Normal retention is less than 30% at 2 hours.  IMPRESSION: Moderately delayed solid gastric emptying.   Original Report Authenticated By: Richardean Sale, M.D.    Dg Chest Port 1 View  02/21/2013  *RADIOLOGY REPORT*  Clinical Data: Cough.  PORTABLE CHEST - 1 VIEW 02/21/2013 1138 hours:  Comparison: Two-view chest x-ray 02/16/2013, 03/15/2011.  Portable chest x-ray 02/13/2013, 03/12/2011.  Findings: Suboptimal inspiration accounts for crowded bronchovascular markings, especially in the lung bases, and accentuates the cardiac silhouette.  Taking this into account, cardiac silhouette mildly enlarged but stable.  Mild diffuse interstitial pulmonary edema.  Small bilateral pleural effusions suspected.  Interval slight worsening since the examination 5 days ago.  IMPRESSION: Suboptimal inspiration.  Worsening interstitial pulmonary edema since the examination 5 days ago.  Probable small bilateral pleural effusions.   Original Report Authenticated By: Evangeline Dakin, M.D.       . antiseptic oral rinse  15 mL Mouth Rinse BID  . aspirin EC  81 mg Oral Daily  . calcium carbonate  1 tablet Oral TID WC  .  ceFAZolin (ANCEF) IV  2 g Intravenous On Call  . darbepoetin (ARANESP) injection - NON-DIALYSIS  100 mcg Subcutaneous Q Wed-1800  . heparin lock flush  2,800 Units Intravenous Once  . insulin aspart  0-9 Units Subcutaneous TID WC  . loratadine  10 mg Oral Daily  . metoCLOPramide  5 mg Oral TID AC  . pantoprazole  40 mg Oral Daily  . sodium chloride  3 mL Intravenous Q12H     I  have reviewed scheduled and prn medications.  ASSESSMENT/RECOMMENDATIONS Pt is a 62 y.o. yo female with a PMHX of DM II, with prior history emphysematous pyelonephritis-- s/p right nephrectomy in 2012, was admitted to [facility] on 02/13/2013 with nausea, vomiting, diarrhea for 2  weeks resulting in AKI with associated metabolic acidosis and hyperkalemia. Initially  felt d/t hypovolemia with BUN 101, Cr 8.42. Baseline Cr from 03/20/11-09/14/11 trended from 0.51-0.93, but no labs in interim PTA, and did not respond to IV fluid hydration this admit, requiring initiation of HD 02/17/13 for volume and azotemia   Renal failure (presume AKI) in patient with solitary kidney Kidney normal size by ultrasound and GU not suspicious of anything acc to Dr. McDiarmid's note.  Urine EOS--still pending (unclear that lab ever received),  ANA, ANCA and antiGBM all negative  IVF did not reverse potential prerenal problem.  HD dep since 4/4  Fem cath (4/4) removed 4/9 S/p TDC today (4/10) then HD after and prn until recovers or not (Creatinine rising >1mg % per day in intradialytic interval) Cannot be discharged until clearly recovers or not (has no means of payment for outpt HD at present even if we declared her ESRD which we cannot do at this stage as she is "medicaid pending")  So will have to remain in house for observation and prn HD until course declares itself  Etiology of her AKI still not readily apparent to me (assuming that it is acute) unless just slow to resolve ATN in her solitary kidney   CKD-MBD On calcium carbonate Check PTH  Anemia  FOBT neg.  Fe panel sat 23%, ferritin 495, folate 8.4.  On Aranesp 100/week  Received Feraheme 510 IV on 4/2.   HTN/Vol  BP OK Last CXR with small effusions and some interstitial edema Decrease vol further with HD  Vomiting  On protonix.  EGD negative  IS H pylori positive but no plan to Rx since no ulcers etc seen on EGD  Felt gastroparesis  On Reglan  Gast emptying study pending   UTI  E Coli  S/p rocephin  Jamal Maes, MD [place] Pager 02/23/2013, 8:58 AM

## 2013-02-23 NOTE — Progress Notes (Signed)
Report called to Synetta Fail, receiving RN on  6700. VSS. Transferred to hemodialysis and patient will transfer from HD to [phone].Personal belongings sent up to [id number].   [facility]

## 2013-02-23 NOTE — Progress Notes (Signed)
Family Medicine Teaching Service Daily Progress Note Service Page: [phone]  Subjective:  Patient's temporary dialysis catheter removed yesterday, she tolerated PT, and she had gastric emptying study showing moderate slowing. She was NPO overnight for tunneled catheter placement by IR today.  She states having tolerated some liquids yesterday with only a little nausea.  Objective: BP 141/65  Pulse 57  Temp(Src) 98.8 F (37.1 C) (Oral)  Resp 19  Ht 5\' 5"  (1.651 m)  Wt 183 lb 13.8 oz (83.4 kg)  BMI 30.6 kg/m2  SpO2 98%   Exam:  General: resting comfortably in bed, NAD, talkative Cardiovascular: RRR. No murmurs, rubs, or gallops.  Respiratory: Fine basilar crackles, poor effort. No wheezing appreciated. No increased work of breathing.  Abdomen: obese, soft, nondistended, NT, NABS.  Extremities: Trace LE edema, SCD not in place today, 2+ bilateral DP pulses  NEURO: Awake, alert, normal speech, no focal findings SKIN: confluent nonraised erythematous rash on chest, decreased from other parts of body  Intake/Output Summary (Last 24 hours) at 02/23/13 0655 Last data filed at 02/23/13 0046  Gross per 24 hour  Intake      0 ml  Output    525 ml  Net   -525 ml    I have reviewed the patient's medications, labs, imaging, and diagnostic testing.  Notable results are summarized below. Medications - No PRN over 24 hours as of 7 AM   Labs/Imaging -   Recent Labs Lab 02/21/13 0522 02/22/13 0450 02/23/13 0500  HGB 8.9* 8.1* 8.0*  HCT 29.0* 26.0* 25.6*  WBC 11.8* 9.7 9.9  PLT 257 189 197    Recent Labs Lab 02/18/13 0445 02/19/13 0747 02/19/13 1518 02/20/13 0445 02/21/13 0522 02/22/13 0450  NA 133* 134* 133* 135 131* 137  K 4.1 4.4 4.8 4.4 4.6 3.7  CL 92* 93* 93* 95* 94* 100  CO2 26 26 26 28 27 28   GLUCOSE 102* 95 126* 122* 121* 109*  BUN 57* 41* 42* 44* 47* 12  CREATININE 6.19* 5.24* 5.37* 5.87* 6.44* 2.51*  CALCIUM 7.1* 8.2* 8.2* 8.2* 7.8* 7.4*  MG 2.7*  --   --   --    --   --   PHOS 5.3* 4.2  --  4.2 3.9 2.2*   Autoimmune studies (AND, GBM Ab, myeloperoxidase abs, serine protease 3) WNL Alpha-1-globulin 10.2 Alpha-2-globulin 21.1 Beta-globulin  4.5, total protein ELP 5.7, albumin ELP 41.7  Echocardiogram 4/8: Normal systolic function (EF 32-99%), trivial mitral regurgitation, mild tricuspid regurgitation Gastric emptying study: Moderate slowing Urine culture 4/10 - in process, catheterized sample   Assessment and Plan: TIDA SANER is a 62 y.o. female presenting with PMH of DM-2, muscle spasms, and emphysematous pylenephritis s/p right nephrectomy who presented with nausea/vomiting for approximately 2 weeks. On admission found to have acute kidney injury with associated metabolic acidosis and hyperkalemia, as well as intractable n/v possibly secondary to azotemia.   # AKI - Likely prerenal ?slowly resolving ATN in solitary kidney. Creatinine improving with dialysis but slightly worsened to 3.74 today. BP tenuous, and pt not on any antihypertensives. - UTI likely contributing, e coli pan-sensitive; continued CTX due to poor PO (10-day course, last dose 4/10 at midnight)  - f/u cath urine culture, collected 4/10 AM, due to 4/5 cath urine culture growing 5000 CFU e coli after starting abx. - Renal following and we greatly appreciate further recommendations.      - 4/5, 4/6, 4/8 late pm HD    - ANCA, anti  GBM, ANA  Negative, urine eosinophils pending  - Continue calcium carbonate for low calcium  - Echo ordered for fluid overload was WNL with mild regurgitation (Tricuspid and mitral); order IS to bedside - Pulled femoral cath yesterday so patient could be OOB and plan to place tunneled HD cath today 4/10  # Hyperkalemia with mild T wave peaking on EKG- s/p kayexalate with resultant hypokalemia, dosed IV K x 1, resolved; normal Mg.  # Atrial fibrillation with RVR - Brief episode 4/8 with vomiting, started diltiazem drip and pt came out of episode. With  borderline low blood pressures, stopped diltiazem drip and pt stable with no further episodes.  # Metabolic acidosis - Thought to be likely from diarrhea. Corrected after IV fluids with bicarbonate   # UTI  - Urine Cx resulted >100,000 cfu e coli, pan-sensitive; 2nd culture 4/5 again with 5000 CFU e coli - continue CTX started 4/1 for 10 days course due to h/o emphysematous pyelo (last dose early this morning, discontinued) - F/u 4/10 cath urine culture TOC  # Nausea/vomiting/diarrhea - thought due to AKI/azotemia, and was improving with HD until yesterday when symptoms returned; now more strongly considering contribution of gastroparesis - GI consulted - appreciate reccs  - Normal EGD  - Gatsric emptying study - now now when pt feels less ill - Protonix, reglan (need endpoint in mind to avoid tardive dyskinesia) - H pylori IgG positive - will hold off on treatment as no EGD findings and do not want to muddy the waters with potential for c diff infection or GI symptoms with antibiotics if not needed - Zofran PRN, phenergan prn  # DM-2 - A1c 6.1; - CBG's TID AC and QHS  - Sensitive SSI   # Anemia - Iron panel with sat 23%, ferritin 495, folate 8.4; IV Fe per renal, FOBT neg x 2  - IV iron, aranesp - Will monitor closely during admission. - Transfuse for Hgb <7  # Muscle spasms of back  - Tylenol PRN; will refrain from flexeril due to anticholinergic effects   # Hypertension - Still elevated today, 143/65, but better overall with no extreme highs or lows - Continue to monitor.   # Diverticulosis with no evidence of diverticulitis on CT  # CHD risk with DM Type II and HTN - continue low-dose aspirin  # FEN/GI: SLIV; diet carb modified  # Prophylaxis: SCDs   # Disposition: Pending clinical improvement in nausea and vomiting and further renal management for AKI  - f/u PT/OT recs: Home health PT, rolling walker with 5" wheels  - Hoped for possible discharge soon pending renal recs  and able to tolerate PO but cannot due to AKI and continued need for HD. - F/u CM to find out if can get outpatient HD for AKI.  # Code Status: Full code   Conni Slipper, MD 02/23/2013, 6:52 AM

## 2013-02-23 NOTE — Progress Notes (Signed)
X2 box not operating correctly with dialysis tele box. Notified CCMT and sent patient to dialysis on dialysis tele box.

## 2013-02-23 NOTE — Evaluation (Signed)
Occupational Therapy Evaluation Patient Details Name: [name] [name] MRN: [id number] DOB: Jan 19, 1951 Today's Date: 02/23/2013 Time: 4098-1191 OT Time Calculation (min): 23 min  OT Assessment / Plan / Recommendation Clinical Impression  62 yo female admitted with nausea, vomitting and diarrhea for 2 weeks with associated metabolic acidosis and hyperkalemia. Pt with UTI and Rt IJ tunneled HD cath placed. Ot to follow acutely. Recommend SNf for d/c planning due to no transportation and limited caregiver support.    OT Assessment  Patient needs continued OT Services    Follow Up Recommendations  SNF;Supervision/Assistance - 24 hour If transportation and ability to obtain food is resolved patient could progress to HHOT. Pt currently unsafe to d/c home due to decr caregiver support. Husband with visual deficits and amputation.   Barriers to Discharge Decreased caregiver support No transportation for HD  Equipment Recommendations  3 in 1 bedside comode;Other (comment) (RW)    Recommendations for Other Services    Frequency  Min 2X/week    Precautions / Restrictions Precautions Precautions: Fall Restrictions Weight Bearing Restrictions: No   Pertinent Vitals/Pain 86% RA oxygen saturation    ADL  Eating/Feeding: Set up Where Assessed - Eating/Feeding: Chair Grooming: Wash/dry hands;Wash/dry face;Set up Where Assessed - Grooming: Supported sitting Lower Body Dressing: Min guard Where Assessed - Lower Body Dressing: Unsupported sitting (don socks/ Rt Knee deficits) Toilet Transfer: Minimal assistance Toilet Transfer Method: Sit to stand Toilet Transfer Equipment: Raised toilet seat with arms (or 3-in-1 over toilet) Equipment Used: Gait belt;Rolling walker Transfers/Ambulation Related to ADLs: Pt ambulated ~ 100 ft this session. Pt with gait velocity 32 ft in 11 seconds. Pt demonstrates high fall risk ADL Comments: Pt with husband that has visual impairments and needs (A) for  adls. Pt has a brother in [place] that has assisted husband with transportation to hospital but can not continue to be main source of transportation. Pt expressed that she has no idea how she is going to get to HD upon d/c or grocery store. Pt with oxgyen desaturation to 86 % with ambulation on RA. Pt provided rest break and rebounded to 95% on RA. Pt reports "my legs feel heavy"    OT Diagnosis: Generalized weakness;Acute pain  OT Problem List: Decreased strength;Decreased activity tolerance;Impaired balance (sitting and/or standing);Decreased knowledge of use of DME or AE;Decreased knowledge of precautions;Decreased safety awareness;Obesity OT Treatment Interventions: Self-care/ADL training;DME and/or AE instruction;Therapeutic activities;Patient/family education;Balance training   OT Goals Acute Rehab OT Goals OT Goal Formulation: With patient Time For Goal Achievement: 03/09/13 Potential to Achieve Goals: Good ADL Goals Pt Will Perform Upper Body Bathing: with modified independence;Sit to stand from chair;Sitting at sink ADL Goal: Upper Body Bathing - Progress: Goal set today Pt Will Perform Lower Body Bathing: with modified independence;Sit to stand from chair;Sitting at sink ADL Goal: Lower Body Bathing - Progress: Goal set today Pt Will Perform Upper Body Dressing: with modified independence;Sit to stand from chair;Sitting, chair ADL Goal: Upper Body Dressing - Progress: Goal set today Pt Will Perform Lower Body Dressing: with modified independence;Sit to stand from chair;Sitting, chair ADL Goal: Lower Body Dressing - Progress: Goal set today Pt Will Transfer to Toilet: with modified independence;3-in-1 ADL Goal: Toilet Transfer - Progress: Goal set today Pt Will Perform Toileting - Hygiene: with modified independence;Sit to stand from 3-in-1/toilet ADL Goal: Toileting - Hygiene - Progress: Goal set today  Visit Information  Last OT Received On: 02/23/13 Assistance Needed: +1     Subjective Data  Subjective: "That  is going to be a problem"- pt does not have transportation to HD upon d/c Patient Stated Goal: to go home   Prior Functioning     Home Living Lives With: Spouse Available Help at Discharge: Family Type of Home: House Home Access: Level entry Home Layout: One level Home Adaptive Equipment: Straight cane;Walker - rolling Prior Function Level of Independence: Independent with assistive device(s) Able to Take Stairs?: Yes Driving: No Communication Communication: HOH Dominant Hand: Right         Vision/Perception Vision - History Baseline Vision: No visual deficits Patient Visual Report: No change from baseline   Cognition  Cognition Overall Cognitive Status: Appears within functional limits for tasks assessed/performed Arousal/Alertness: Awake/alert Orientation Level: Appears intact for tasks assessed Behavior During Session: [facility] for tasks performed    Extremity/Trunk Assessment Right Upper Extremity Assessment RUE ROM/Strength/Tone: Within functional levels RUE Sensation: WFL - Light Touch RUE Coordination: WFL - gross/fine motor Left Upper Extremity Assessment LUE ROM/Strength/Tone: Within functional levels LUE Sensation: WFL - Light Touch LUE Coordination: WFL - gross/fine motor Trunk Assessment Trunk Assessment: Kyphotic     Mobility Bed Mobility Bed Mobility: Not assessed Transfers Sit to Stand: 4: Min assist;With upper extremity assist;From chair/3-in-1 Stand to Sit: 4: Min assist;With upper extremity assist;To chair/3-in-1 Details for Transfer Assistance: pt with excellent hand placement for sit<>Stand. Pt with uncontrolled descend to chair the second attempt.      Exercise     Balance Static Standing Balance Static Standing - Balance Support: Bilateral upper extremity supported;During functional activity Static Standing - Level of Assistance: 5: Stand by assistance   End of Session OT - End of Session Activity  Tolerance: Patient limited by fatigue Patient left: in chair;with call bell/phone within reach Nurse Communication: Mobility status;Precautions  GO     Lucile Shutters 02/23/2013, 11:56 AM  Pager: [phone]

## 2013-02-23 NOTE — Progress Notes (Signed)
Received from 3300, via hemo, in bed. Patient c/o being hungry and thirsty. Spoke with 3300, tray not on unit,checked 6700, tray not on unit, Service Response notified,tray ordered. CBG=113.No insulin required. Beverage obtained for pt. Meds moved to 1930 for administration with food as ordered.

## 2013-02-23 NOTE — Procedures (Signed)
R IJ tunneled HD catheter No complication No blood loss. See complete dictation in [facility] P[facility].  

## 2013-02-23 NOTE — Progress Notes (Signed)
Seen and examined.  Agree with Dr. Karie Schwalbe.  Feel safe to transfer to floor.

## 2013-02-24 DIAGNOSIS — R5381 Other malaise: Secondary | ICD-10-CM

## 2013-02-24 DIAGNOSIS — L299 Pruritus, unspecified: Secondary | ICD-10-CM

## 2013-02-24 LAB — CBC
MCH: 29.6 pg (ref 26.0–34.0)
MCHC: 30.2 g/dL (ref 30.0–36.0)
Platelets: 171 10*3/uL (ref 150–400)

## 2013-02-24 LAB — GLUCOSE, CAPILLARY
Glucose-Capillary: 184 mg/dL — ABNORMAL HIGH (ref 70–99)
Glucose-Capillary: 108 mg/dL — ABNORMAL HIGH (ref 70–99)
Glucose-Capillary: 131 mg/dL — ABNORMAL HIGH (ref 70–99)

## 2013-02-24 LAB — PARATHYROID HORMONE, INTACT (NO CA): PTH: 334.7 pg/mL — ABNORMAL HIGH (ref 14.0–72.0)

## 2013-02-24 LAB — URINE CULTURE

## 2013-02-24 MED ORDER — HYDROXYZINE HCL 25 MG PO TABS
25.0000 mg | ORAL_TABLET | Freq: Four times a day (QID) | ORAL | Status: DC | PRN
  Administered 2013-02-26: 25 mg via ORAL
  Filled 2013-02-24 (×2): qty 1

## 2013-02-24 MED FILL — Heparin Sodium (Porcine) Inj 1000 Unit/ML: INTRAMUSCULAR | Qty: 10 | Status: AC

## 2013-02-24 NOTE — Progress Notes (Signed)
Physical Therapy Treatment Patient Details Name: [name] [name] MRN: [id number] DOB: 06-13-1951 Today's Date: 02/24/2013 Time: 4696-2952 PT Time Calculation (min): 25 min  PT Assessment / Plan / Recommendation Comments on Treatment Session  Pt mobility improving.      Follow Up Recommendations  Home health PT     Does the patient have the potential to tolerate intense rehabilitation     Barriers to Discharge        Equipment Recommendations  Rolling walker with 5" wheels    Recommendations for Other Services    Frequency Min 3X/week   Plan Discharge plan remains appropriate    Precautions / Restrictions Precautions Precautions: Fall Restrictions Weight Bearing Restrictions: No   Pertinent Vitals/Pain No c/o pain.     Mobility  Bed Mobility Bed Mobility: Supine to Sit Supine to Sit: 5: Supervision;HOB elevated Sitting - Scoot to Edge of Bed: 5: Supervision Sit to Supine: Not Tested (comment) Details for Bed Mobility Assistance: Pt required use of rails and verbal cues for technique.   Transfers Transfers: Sit to Stand;Stand to Sit Sit to Stand: 5: Supervision;From bed Stand to Sit: 5: Supervision;To chair/3-in-1 Details for Transfer Assistance: No instruction required.  Ambulation/Gait Ambulation/Gait Assistance: 4: Min assist Ambulation Distance (Feet): 150 Feet Assistive device: Rolling walker Ambulation/Gait Assistance Details: Pt had 2 LOB episodes with RW secondary to weakness in R LE.   Gait Pattern: Step-through pattern General Gait Details: SpO2 stable on 2L O2 via [place].  SpO2 at rest was 93% on room air,  Stairs: No Wheelchair Mobility Wheelchair Mobility: No    Exercises     PT Diagnosis:    PT Problem List:   PT Treatment Interventions:     PT Goals Acute Rehab PT Goals PT Goal Formulation: With patient Time For Goal Achievement: 02/28/13 Potential to Achieve Goals: Good Pt will go Sit to Stand: with modified independence PT Goal: Sit to  Stand - Progress: Progressing toward goal Pt will go Stand to Sit: with modified independence PT Goal: Stand to Sit - Progress: Progressing toward goal Pt will Transfer Bed to Chair/Chair to Bed: with modified independence PT Transfer Goal: Bed to Chair/Chair to Bed - Progress: Goal set today Pt will Ambulate: >150 feet;with modified independence;with least restrictive assistive device PT Goal: Ambulate - Progress: Progressing toward goal  Visit Information  Last PT Received On: 02/24/13 Assistance Needed: +1    Subjective Data  Subjective: I dont know if I will have dialysis today.   Patient Stated Goal: To go home and be independent.   Cognition  Cognition Overall Cognitive Status: Appears within functional limits for tasks assessed/performed Arousal/Alertness: Awake/alert Orientation Level: Appears intact for tasks assessed Behavior During Session: [facility] for tasks performed Cognition - Other Comments: pt HOH so have to speak loudly    Balance  Balance Balance Assessed: No  End of Session PT - End of Session Equipment Utilized During Treatment: Gait belt Activity Tolerance: Patient tolerated treatment well Patient left: in chair;with call bell/phone within reach Nurse Communication: Mobility status   GP     [name] [name] 02/24/2013, 11:28 AM  Theron Arista L. Rashed Edler DPT [phone]

## 2013-02-24 NOTE — Progress Notes (Signed)
Occupational Therapy Treatment Patient Details Name: [name] [name] MRN: [id number] DOB: 05-Apr-1951 Today's Date: 02/24/2013 Time:  -     OT Assessment / Plan / Recommendation Comments on Treatment Session Pt was in good spirits about the progress she had made. Pt stated that she completed a toliet transfer on a regular height toliet with nursing earlier without a rw.  Pt stated she felt confident in completeing all of her self care task and did not need OT anymore.    Follow Up Recommendations  No OT follow up       Equipment Recommendations  3 in 1 bedside comode          Plan Discharge plan needs to be updated       Pertinent Vitals/Pain No denies of pain    ADL  Grooming: Wash/dry face;Modified independent Where Assessed - Grooming: Unsupported standing Toilet Transfer: Performed;Modified independent Toilet Transfer Method: Sit to [place]: Regular height toilet Toileting - Clothing Manipulation and Hygiene: Performed;Modified independent Where Assessed - Toileting Clothing Manipulation and Hygiene: Standing Equipment Used: Gait belt Transfers/Ambulation Related to ADLs: Pt ambulated from recliner>bathroom>sink>closet>recliner with increased time.  Pt did not use rw this session but did furniture walk during session ADL Comments: Pt was able to complete grooming task of washing face and reaching for objects out of BOS while on RA at 92%      OT Goals ADL Goals ADL Goal: Toilet Transfer - Progress: Met ADL Goal: Toileting - Hygiene - Progress: Met           Cognition  Cognition Overall Cognitive Status: Appears within functional limits for tasks assessed/performed Arousal/Alertness: Awake/alert Behavior During Session: [facility] for tasks performed Cognition - Other Comments: pt HOH so have to speak loudly    Mobility  Bed Mobility Details for Bed Mobility Assistance: Pt in recliner at beginning of session Transfers Transfers: Sit to  Stand;Stand to Sit Sit to Stand: 6: Modified independent (Device/Increase time);With armrests;From chair/3-in-1;From toilet Stand to Sit: 6: Modified independent (Device/Increase time);With armrests;To chair/3-in-1;To toilet Details for Transfer Assistance: No instruction required for safe hand placement       Balance Balance Balance Assessed: Yes Dynamic Standing Balance Dynamic Standing - Balance Support: No upper extremity supported;During functional activity Dynamic Standing - Level of Assistance: 6: Modified independent (Device/Increase time) Dynamic Standing - Balance Activities: Reaching for objects;Forward lean/weight shifting   End of Session OT - End of Session Equipment Utilized During Treatment: Gait belt Activity Tolerance: Patient tolerated treatment well Patient left: in chair;with call bell/phone within reach       Dietrich Pates 02/24/2013, 3:34 PM

## 2013-02-24 NOTE — Care Management Note (Signed)
   CARE MANAGEMENT NOTE 02/24/2013  Patient:  [name] [name], [name] [name]   Account Number:  [id number]  Date Initiated:  02/17/2013  Documentation initiated by:  Donn Pierini  Subjective/Objective Assessment:   Pt admitted with ARF     Action/Plan:   PTA pt lived at home with spouse   Anticipated DC Date:  02/27/2013   Anticipated DC Plan:  HOME W HOME HEALTH SERVICES      DC Planning Services  CM consult      Choice offered to / List presented to:             Status of service:  In process, will continue to follow Medicare Important Message given?   (If response is "NO", the following Medicare IM given date fields will be blank) Date Medicare IM given:   Date Additional Medicare IM given:    Discharge Disposition:    Per UR Regulation:  Reviewed for med. necessity/level of care/duration of stay  If discussed at Long Length of Stay Meetings, dates discussed:    Comments:   02/24/2013 Noted that pt has not been determined to be Chronic Renal failure , however checked with Finance to see is application had been started as the notes state Medicaid Pending. Spoke with Rachael Darby of Finance who states that a Medicaid application was initiated on 02/21/2013.  Dr Eliott Nine informed, CM will continue to follow patient. Johny Shock RN MPH Case Manager [phone]    02/23/13- 1200- Donn Pierini RN, BSN [phone] Received referral regarding pt's HD situation- pt is self pay- has no insurance- and as per renal note- "Cannot be discharged until clearly recovers or not (has no means of payment for outpt HD at present even if we declared her ESRD which we cannot do at this stage as she is "medicaid pending") So will have to remain in house for observation and prn HD until course declares itself"- spoke with resident regarding this and explained that pt has no other option but to remain in hospital   02/21/13- 1515- Donn Pierini RN, BSN [phone] Pt went into afib today now on cardizem gtt- plan  for HD again today then possible removal of fem. cath to allow pt to get up- PT/OT still recommending HH at discharge- NCM to cont. to follow for d/c planning  02/17/13- 1125- Donn Pierini RN, BSN [phone] Plan today to insert fem vein HD cath  and begin HD tx- per PT evals recommendations at this time for [facility]- RNCM will follow for d/c needs/planning as pt progresses.

## 2013-02-24 NOTE — Progress Notes (Signed)
Seen and examined.  Patient is teary - and her emotions/depression is improving.  She is more positive and future focused.  Medically, we are settling in to a stable phase.  Of course, the big question is: If/when will her kidney recover?  Continue current treatment.

## 2013-02-24 NOTE — Progress Notes (Signed)
I have reviewed and agree with this note. Acute OT will sign off. Ignacia Palma, OTR/L [phone] 02/24/2013

## 2013-02-24 NOTE — Progress Notes (Signed)
Family Medicine Teaching Service Daily Progress Note Service Page: [phone]  Subjective:  Patient received tunneled catheter yesterday. Reports Doing well this morning, not vomiting and tolerating diet today and yesterday. Had severe itching overnight but benadryl helped. Some SOB when moving but otherwise okay.  Medications have been reviewed. Patient received PRN Benadryl at 2300 for itching and tylenol at 2000 over past 24 hours.  Objective: BP 144/61  Pulse 65  Temp(Src) 98.2 F (36.8 C) (Oral)  Resp 18  Ht 5\' 5"  (1.651 m)  Wt 182 lb 5.1 oz (82.7 kg)  BMI 30.34 kg/m2  SpO2 96%   Exam: General: resting comfortably in bed, NAD, talkative Cardiovascular: RRR. No murmurs, rubs, or gallops.  Respiratory: CTAB anteriorly, poor effort. No wheezing appreciated. No increased work of breathing.  Abdomen: obese, soft, nondistended, NT, NABS.  Extremities: Trace LE edema, tenderness to palpation at ankles and shin but not calves, no cord, no erythema or size difference, SCD not in place today, 2+ bilateral DP pulses  NEURO: Awake, alert, normal speech, no focal findings SKIN: No rash seen today but excoriated skin on legs and arms.   Intake/Output Summary (Last 24 hours) at 02/24/13 0553 Last data filed at 02/23/13 2300  Gross per 24 hour  Intake   1203 ml  Output   1217 ml  Net    -14 ml    Labs/Imaging -  Urine culture 4/10 - Pending  Echocardiogram 4/8: Normal systolic function (EF 32-44%), trivial mitral regurgitation, mild tricuspid regurgitation Gastric emptying study: Moderate slowing Urine culture 4/10 - in process, catheterized sample  4/10 IR fluoro - guided CV line RIGHT: IMPRESSION:  1. Technically successful placement of tunneled right IJ  hemodialysis catheter with ultrasound and fluoroscopic guidance.  Ready for routine use.  Assessment and Plan: [name] [name] is a 62 y.o. female presenting with PMH of DM-2, muscle spasms, and emphysematous pylenephritis s/p  right nephrectomy who presented with nausea/vomiting for approximately 2 weeks. On admission found to have acute kidney injury with associated metabolic acidosis and hyperkalemia, as well as intractable n/v possibly secondary to azotemia.   # AKI - Likely prerenal ?slowly resolving ATN in solitary kidney. Creatinine improving with dialysis but slightly worsened to 3.74 yest. BP tenuous, and pt not on any antihypertensives. -E coli UTI likely contributed but now is s/p treatment of CTX x 10 days.  - f/u cath urine culture, collected 4/10 AM, due to 4/5 cath urine culture growing 5000 CFU e coli after starting abx. - Renal following and we greatly appreciate further recommendations.      - 4/5, 4/6, 4/8 late pm HD    - ANCA, anti GBM, ANA  Negative, urine eosinophils pending  - Continue calcium carbonate for low calcium  - Echo ordered for fluid overload was WNL with mild regurgitation (Tricuspid and mitral); order IS to bedside - Tunneled cath placed today  # Hyperkalemia with mild T wave peaking on EKG- s/p kayexalate with resultant hypokalemia, dosed IV K x 1, resolved; normal Mg.  # Itching - Severe itching overnight that was keeping patient awake. Improved with benadryl which pt requested, restarted prn overnight. - Discontinue when possible due to sedation side effect  # Atrial fibrillation with RVR - Brief episode 4/8 with vomiting, started diltiazem drip and pt came out of episode. With borderline low blood pressures, stopped diltiazem drip and pt stable with no further episodes.  # Metabolic acidosis - Thought to be likely from diarrhea. Corrected after IV fluids  with bicarbonate   # UTI  - Urine Cx resulted >100,000 cfu e coli, pan-sensitive; 2nd culture 4/5 again with 5000 CFU e coli - continue CTX started 4/1 for 10 days course due to h/o emphysematous pyelo (last dose early this morning, discontinued) - F/u 4/10 cath urine culture TOC  # Nausea/vomiting/diarrhea - Improved today;  likely from azotemia and gastroparesis - GI consulted and found normal EGD and gastric emptying study with moderate slowing - Protonix, reglan (with eventual endpoint to avoid Tardive Dyskinesia) - H pylori IgG positive - will hold off on treatment as no EGD findings and do not want to muddy the waters with potential for c diff infection or GI symptoms with antibiotics if not needed - Zofran PRN, phenergan prn  # DM-2 - A1c 6.1; - CBG's TID AC and QHS  - Sensitive SSI   # Anemia - Iron panel with sat 23%, ferritin 495, folate 8.4; IV Fe per renal, FOBT neg x 2  - IV iron, aranesp - Will monitor closely during admission. - Transfuse for Hgb <7  # Muscle spasms of back  - Tylenol PRN; will refrain from flexeril due to anticholinergic effects   # Hypertension - Still elevated today to SBP 150s and down to 100s - Continue to monitor.   # Diverticulosis with no evidence of diverticulitis on CT  # CHD risk with DM Type II and HTN - continue low-dose aspirin  # FEN/GI: SLIV; diet carb modified with low sodium  # Prophylaxis: SCDs   # Disposition: Pending clinical improvement in AKI - f/u PT/OT recs: Changed recs yesterday to SNF or 24 hr supervision if transportation and ability to obtain food is resolved. 3-in-1 bedside commode.   - Consulted CM in hopes of earlier discharge with outpatient HD but this is not possible.  # Code Status: Full code   Conni Slipper, MD 02/24/2013, 5:53 AM

## 2013-02-24 NOTE — Progress Notes (Signed)
The patient has had not had nausea and vomiting for about 48 hours, and is currently tolerating a solid diet, on low-dose oral metoclopramide (5 mg prior to meals).  No obvious neurologic side effects to the metoclopramide so far, specifically, no unusual sleep disturbance, excessive drowsiness, tremor, or twitching.  Potential for long-term neurologic side effects discussed in general terms, along with my recommendation that the patient be tried on a taper to zero of the metoclopramide medication, resuming it in the event of recurrent symptoms.  The decision as to whether, and when, to initiate such a taper will be deferred to the patient's attending physician, but realistically, when she has adapted to dialysis would probably be a good time (for example, in the next week or two).  It is my hope this patient will not need long-term metoclopramide on an ongoing basis, especially if her nausea and vomiting were largely do to her azotemia, and by doing so, the risk of long-term neurologic side effects such as choreoathetotic movements could be either avoided or minimized.  To summarize: Recommend tapering off metoclopramide (see above discussion).  I will sign off at this time, but we would be very happy to discuss the patient's case with you, or to see her again at your request.  [name] [name], M.D. [phone]

## 2013-02-24 NOTE — Progress Notes (Signed)
Subjective:  Very emotional Talking with Dr. Andria Frames Worrying about whether or not she will have to stay on HD, who will be "my central person" re management of medical problems Blames self because did not see MD since 09/2011 Had HD 4/10 via new TDC (poaced 4/10) Objective:   Vital signs in last 24 hours: Filed Vitals:   02/23/13 2059 02/24/13 0512 02/24/13 1000 02/24/13 1400  BP: 107/47 144/61 147/62 150/65  Pulse: 73 65 72 66  Temp: 98.6 F (37 C) 98.2 F (36.8 C) 98.6 F (37 C) 98.6 F (37 C)  TempSrc: Oral Oral Oral Oral  Resp: 20 18 18 18   Height:      Weight: 82.7 kg (182 lb 5.1 oz)     SpO2: 97% 96% 98% 98%   Weight change: -0.4 kg (-14.1 oz)  Intake/Output Summary (Last 24 hours) at 02/24/13 1622 Last data filed at 02/24/13 1300  Gross per 24 hour  Intake   1320 ml  Output    996 ml  Net    324 ml  She says UOP not being measured Weights:  02/23/13 2059 82.7 kg  02/22/13 0303 83.5 kg  02/22/13 0228 83 kg  02/21/13 2240 82.8 kg  02/21/13 0800 82.7 kg  02/18/13 2135 83.7 kg  02/18/13 1930 85.3 kg  02/17/13 1604 86.6 kg  02/17/13 1145 88.9 kg  02/16/13 0715 87.9 kg  02/14/13 0030 83.3 kg   Physical Exam:  Blood pressure 150/65, pulse 66, temperature 98.6 F (37 C), temperature source Oral, resp. rate 18, height 5\' 5"  (1.651 m), weight 82.7 kg (182 lb 5.1 oz), SpO2 98.00%. Hard of hearing NAD  Wearing oxygen OOB in chair Right TDC Lungs grossly clear S1S2 no S3 Abdomen soft and non tender 1+ edema LE's   Recent Labs Lab 02/18/13 0445 02/19/13 0747 02/19/13 1518 02/20/13 0445 02/21/13 0522 02/22/13 0450 02/23/13 0500  NA 133* 134* 133* 135 131* 137 133*  K 4.1 4.4 4.8 4.4 4.6 3.7 3.5  CL 92* 93* 93* 95* 94* 100 97  CO2 26 26 26 28 27 28 28   GLUCOSE 102* 95 126* 122* 121* 109* 102*  BUN 57* 41* 42* 44* 47* 12 16  CREATININE 6.19* 5.24* 5.37* 5.87* 6.44* 2.51* 3.74*  CALCIUM 7.1* 8.2* 8.2* 8.2* 7.8* 7.4* 7.6*  PHOS 5.3* 4.2  --  4.2 3.9 2.2*  2.9    Recent Labs Lab 02/19/13 1518  02/21/13 0522 02/22/13 0450 02/23/13 0500  AST 10  --   --   --   --   ALT <5  --   --   --   --   ALKPHOS 116  --   --   --   --   BILITOT 0.2*  --   --   --   --   PROT 6.7  --   --   --   --   ALBUMIN 2.6*  < > 2.3* 2.2* 2.3*  < > = values in this interval not displayed.  Recent Labs Lab 02/20/13 1536  LIPASE 21    Recent Labs Lab 02/21/13 0522 02/22/13 0450 02/23/13 0500 02/24/13 0918  WBC 11.8* 9.7 9.9 8.7  HGB 8.9* 8.1* 8.0* 8.5*  HCT 29.0* 26.0* 25.6* 28.1*  MCV 94.8 95.9 95.9 97.9  PLT 257 189 197 171    Recent Labs Lab 02/23/13 1218 02/23/13 1812 02/23/13 2212 02/24/13 0903 02/24/13 1105  GLUCAP 116* 110* 246* 156* 184*  Results for [name], [name] (  MRN [id number]) as of 02/20/2013 15:11   Ref. Range  02/18/2013 11:33   ANA  Latest Range: NEGATIVE  NEGATIVE   GBM Ab  Latest Range: <20 AU/mL  <1   Myeloperoxidase Abs  Latest Range: <20 AU/mL  <1   Serine Protease 3  Latest Range: <20 AU/mL  <1   Results for [name], [name] (MRN [id number]) as of 02/20/2013 15:11   Ref. Range  02/17/2013 04:04  02/17/2013 16:37   Hepatitis B Surface Ag  Latest Range: NEGATIVE  NEGATIVE    Hep B S Ab  Latest Range: NONREACTIVE  NONREACTIVE    Hep B Core Total Ab  Latest Range: NEGATIVE  NEGATIVE    H Pylori IgG  No range found   2.24 (H)   Results for [name], [name] (MRN [id number]) as of 02/22/2013 12:28   Ref. Range  02/14/2013 04:49   Iron  Latest Range: 42-135 ug/dL  31 (L)   UIBC  Latest Range: 125-400 ug/dL  742 (L)   TIBC  Latest Range: 250-470 ug/dL  595 (L)   Saturation Ratios  Latest Range: 20-55 %  23   Ferritin  Latest Range: 10-291 ng/mL  495 (H)   Folate  No range found  8.4     Studies/Results: Ir Fluoro Guide Cv Line Right  02/23/2013  *RADIOLOGY REPORT*  Clinical data: Acute renal failure, needs hemodialysis access.  TUNNELED HEMODIALYSIS CATHETER PLACEMENT WITH ULTRASOUND AND FLUOROSCOPIC GUIDANCE:  Comparison: None   Technique: The procedure, risks, benefits, and alternatives were explained to the patient.  Questions regarding the procedure were encouraged and answered.  The patient understands and consents to the procedure.  As antibiotic prophylaxis, cefazolin 2 grams was ordered pre- procedure and administered intravenously within one hour of incision.  Patency of the right IJ vein was confirmed with ultrasound with image documentation. An appropriate skin site was determined. Region was prepped using maximum barrier technique including cap and mask, sterile gown, sterile gloves, large sterile sheet, and Chlorhexidine   as cutaneous antisepsis. The region was infiltrated locally with 1% lidocaine.  Intravenous Fentanyl and Versed were administered as conscious sedation during continuous cardiorespiratory monitoring by the radiology RN, with a total moderate sedation time of 13 minutes.  Under real-time ultrasound guidance, the right IJ vein was accessed with a 21 gauge micropuncture needle; the needle tip within the vein was confirmed with ultrasound image documentation.   Needle exchanged over the 018 guidewire for transitional dilator, which allowed advancement of a Benson wire into the IVC. Over this, an MPA catheter was advanced. A Hemosplit 19 hemodialysis catheter was tunneled from the right anterior chest wall approach to the right IJ dermatotomy site. The MPA catheter was exchanged over an Amplatz wire for serial vascular dilators which allow placement of a peel- away sheath, through which the catheter was advanced under intermittent fluoroscopy, positioned with its tips in the proximal and midright atrium. Spot chest radiograph confirms good catheter position. No pneumothorax. Catheter was flushed and primed per protocol. Catheter secured externally with O Prolene sutures. The right IJ   dermatotomy site was closed with 3-0 Monocryl subcutaneous suture and covered with Dermabond. No immediate complication.   IMPRESSION:  1. Technically successful placement of tunneled right IJ hemodialysis catheter with ultrasound and fluoroscopic guidance. Ready for routine use.   Original Report Authenticated By: D. Andria Rhein, MD    Ir US Guide Vasc Access Right  02/23/2013  *RADIOLOGY REPORT*  Clinical data: Acute renal failure, needs  hemodialysis access.  TUNNELED HEMODIALYSIS CATHETER PLACEMENT WITH ULTRASOUND AND FLUOROSCOPIC GUIDANCE:  Comparison: None  Technique: The procedure, risks, benefits, and alternatives were explained to the patient.  Questions regarding the procedure were encouraged and answered.  The patient understands and consents to the procedure.  As antibiotic prophylaxis, cefazolin 2 grams was ordered pre- procedure and administered intravenously within one hour of incision.  Patency of the right IJ vein was confirmed with ultrasound with image documentation. An appropriate skin site was determined. Region was prepped using maximum barrier technique including cap and mask, sterile gown, sterile gloves, large sterile sheet, and Chlorhexidine   as cutaneous antisepsis. The region was infiltrated locally with 1% lidocaine.  Intravenous Fentanyl and Versed were administered as conscious sedation during continuous cardiorespiratory monitoring by the radiology RN, with a total moderate sedation time of 13 minutes.  Under real-time ultrasound guidance, the right IJ vein was accessed with a 21 gauge micropuncture needle; the needle tip within the vein was confirmed with ultrasound image documentation.   Needle exchanged over the 018 guidewire for transitional dilator, which allowed advancement of a Benson wire into the IVC. Over this, an MPA catheter was advanced. A Hemosplit 19 hemodialysis catheter was tunneled from the right anterior chest wall approach to the right IJ dermatotomy site. The MPA catheter was exchanged over an Amplatz wire for serial vascular dilators which allow placement of a peel- away sheath,  through which the catheter was advanced under intermittent fluoroscopy, positioned with its tips in the proximal and midright atrium. Spot chest radiograph confirms good catheter position. No pneumothorax. Catheter was flushed and primed per protocol. Catheter secured externally with O Prolene sutures. The right IJ   dermatotomy site was closed with 3-0 Monocryl subcutaneous suture and covered with Dermabond. No immediate complication.  IMPRESSION:  1. Technically successful placement of tunneled right IJ hemodialysis catheter with ultrasound and fluoroscopic guidance. Ready for routine use.   Original Report Authenticated By: D. Hassell III, MD       . antiseptic oral rinse  15 mL Mouth Rinse BID  . aspirin EC  81 mg Oral Daily  . calcium carbonate  1 tablet Oral TID WC  . darbepoetin (ARANESP) injection - NON-DIALYSIS  100 mcg Subcutaneous Q Wed-1800  . heparin lock flush  2,800 Units Intravenous Once  . insulin aspart  0-9 Units Subcutaneous TID WC  . metoCLOPramide  5 mg Oral TID AC  . pantoprazole  40 mg Oral Daily  . sodium chloride  3 mL Intravenous Q12H     I  have reviewed scheduled and prn medications.  ASSESSMENT/RECOMMENDATIONS Pt is a 62 y.o. yo female with a PMHX of DM II, with prior history emphysematous pyelonephritis-- s/p right nephrectomy in 2012, admitted to [facility] on 02/13/2013 with nausea, vomiting, diarrhea for 2 weeks resulting in AKI with associated metabolic acidosis and hyperkalemia. Initially felt d/t hypovolemia with BUN 101, Cr 8.42. (Baseline Cr from 03/20/11-09/14/11 trended from 0.51-0.93, but no labs in interim), and did not respond to IV fluid hydration, requiring initiation of HD 02/17/13 for volume and azotemia   1. Renal failure (presume AKI) in patient with solitary kidney Kidney normal size by ultrasound and GU not suspicious of anything acc to Dr. McDiarmid's note.  Urine EOS--still pending (unclear that lab ever received),  ANA, ANCA and antiGBM all negative   IVF did not reverse potential prerenal problem.  HD dep since 4/4  Fem cath (4/4) removed 4/9 S/p TDC  (4/10) Last HD 4/10  HD prn  Check labs AM (Creatinine rising >1mg % per day in intradialytic interval) Cannot be discharged until clearly recovers or not (has no means of payment for outpt HD at present even if we declared her ESRD which we cannot do at this stage as she is "medicaid pending")  So will have to remain in house for observation and prn HD until course declares itself  Etiology of her AKI still not readily apparent to me (assuming that it is acute) unless just slow to resolve ATN in her solitary kidney   CKD-MBD On calcium carbonate PTH pending  Anemia  FOBT neg.  Fe panel sat 23%, ferritin 495, folate 8.4.  On Aranesp 100/week  Received Feraheme 510 IV on 4/2.   HTN/Vol  BP OK Last CXR with small effusions and some interstitial edema Decrease vol further with HD  Vomiting  Better On protonix/reglan (Dr. Cristina Gong does not want to continue long term).  EGD negative  Gast emptying study c/w gastroparesis  UTI  E Coli  S/p rocephin  Jamal Maes, MD [facility] [phone] Pager 02/24/2013, 4:22 PM

## 2013-02-25 LAB — RENAL FUNCTION PANEL
Albumin: 2.6 g/dL — ABNORMAL LOW (ref 3.5–5.2)
BUN: 15 mg/dL (ref 6–23)
Chloride: 97 mEq/L (ref 96–112)
GFR calc Af Amer: 15 mL/min — ABNORMAL LOW (ref >90)
GFR calc non Af Amer: 13 mL/min — ABNORMAL LOW (ref >90)
Potassium: 4.9 mEq/L (ref 3.5–5.1)
Sodium: 132 mEq/L — ABNORMAL LOW (ref 135–145)

## 2013-02-25 LAB — CBC
HCT: 27.7 % — ABNORMAL LOW (ref 36.0–46.0)
Platelets: 191 10*3/uL (ref 150–400)
RDW: 14.4 % (ref 11.5–15.5)
WBC: 7.9 10*3/uL (ref 4.0–10.5)

## 2013-02-25 MED ORDER — CALCITRIOL 0.5 MCG PO CAPS
0.5000 ug | ORAL_CAPSULE | Freq: Every day | ORAL | Status: DC
  Administered 2013-02-25 – 2013-03-06 (×10): 0.5 ug via ORAL
  Filled 2013-02-25 (×10): qty 1

## 2013-02-25 NOTE — Progress Notes (Signed)
Seen and examined.  Patient is stable.  Her reason for continued hospitalization is dialysis.  Agree with Dr. Yetta Numbers documentation and management.

## 2013-02-25 NOTE — Progress Notes (Signed)
Family Medicine Teaching Service Daily Progress Note Service Page: [phone]  Subjective:  No problems overnight, denies nausea and vomiting   Objective: BP 151/64  Pulse 72  Temp(Src) 98 F (36.7 C) (Oral)  Resp 18  Ht 5\' 5"  (1.651 m)  Wt 181 lb 14.4 oz (82.509 kg)  BMI 30.27 kg/m2  SpO2 98%   Exam: General: resting comfortably in bed, NAD, talkative Cardiovascular: RRR. No murmurs, rubs, or gallops.  Respiratory: CTAB anteriorly, poor effort. No wheezing appreciated. No increased work of breathing.  Abdomen: obese, soft, nondistended, NT, NABS.  Extremities: Trace LE edema, tenderness to palpation at ankles and shin but not calves, no cord, no erythema or size difference, SCD not in place today, 2+ bilateral DP pulses  NEURO: Awake, alert, normal speech, no focal findings SKIN: erythema of anterior chest wall  Intake/Output Summary (Last 24 hours) at 02/25/13 1218 Last data filed at 02/25/13 0900  Gross per 24 hour  Intake    720 ml  Output   1340 ml  Net   -620 ml    Labs/Imaging -  Urine culture 4/10 - negative   Echocardiogram 4/8: Normal systolic function (EF 87-56%), trivial mitral regurgitation, mild tricuspid regurgitation Gastric emptying study: Moderate slowing   4/10 IR fluoro - guided CV line RIGHT: IMPRESSION:  1. Technically successful placement of tunneled right IJ  hemodialysis catheter with ultrasound and fluoroscopic guidance.  Ready for routine use.  Assessment and Plan: [name] [name] is a 62 y.o. female presenting with PMH of DM-2, muscle spasms, and emphysematous pylenephritis s/p right nephrectomy who presented with nausea/vomiting for approximately 2 weeks. On admission found to have acute kidney injury with associated metabolic acidosis and hyperkalemia, as well as intractable n/v possibly secondary to azotemia.   # AKI - Likely prerenal ?slowly resolving ATN in solitary kidney. Creatinine improving with dialysis but slightly worsened to 3.74  yest. BP tenuous, and pt not on any antihypertensives. -E coli UTI likely contributed but now is s/p treatment of CTX x 10 days.  - Renal following and we greatly appreciate further recommendations.      - 4/5, 4/6, 4/8, 4/10    - ANCA, anti GBM, ANA  Negative, urine eosinophils pending  - Continue calcium carbonate for low calcium  - Remain in house while waiting improvement vs 6 weeks   # Anemia - Iron panel with sat 23%, ferritin 495, folate 8.4; IV Fe per renal, FOBT neg x 2  - IV iron, aranesp - Will monitor closely during admission. - Transfuse for Hgb <7  # Hyperkalemia with mild T wave peaking on EKG- s/p kayexalate with resultant hypokalemia, dosed IV K x 1, resolved; normal Mg.  # Itching - Severe itching overnight that was keeping patient awake. Improved with benadryl which pt requested, restarted prn overnight. - Discontinue when possible due to sedation side effect  # Atrial fibrillation with RVR - Brief episode 4/8 with vomiting, started diltiazem drip and pt came out of episode. With borderline low blood pressures, stopped diltiazem drip and pt stable with no further episodes.  # Metabolic acidosis - Thought to be likely from diarrhea. Corrected after IV fluids with bicarbonate   # UTI  - Urine Cx resulted >100,000 cfu e coli, pan-sensitive; 2nd culture 4/5 again with 5000 CFU e coli - continue CTX started 4/1 for 10 days course due to h/o emphysematous pyelo (last dose early this morning, discontinued) - Neg 4/10 culture   # Nausea/vomiting/diarrhea - Improved today; likely  from azotemia and gastroparesis - GI consulted and found normal EGD and gastric emptying study with moderate slowing - H pylori IgG positive - will hold off on treatment as no EGD findings and do not want to muddy the waters with potential for c diff infection or GI symptoms with antibiotics if not needed - Zofran PRN, phenergan prn - Protonix daily - Reglan stopped and GI signed off 4/11; greatly  appreciate their input   # DM-2 - A1c 6.1; - CBG's TID AC and QHS  - Sensitive SSI    # Muscle spasms of back  - Tylenol PRN; will refrain from flexeril due to anticholinergic effects   # Hypertension - Still elevated today to SBP 150s and down to 100s - Continue to monitor.   # Diverticulosis with no evidence of diverticulitis on CT  # CHD risk with DM Type II and HTN - continue low-dose aspirin  # FEN/GI: SLIV; diet carb modified with low sodium  # Prophylaxis: SCDs   # Disposition: Pending clinical improvement in AKI - f/u PT/OT recs: PT- home health with rolling walker and 5" wheels; OT - 3-in-1 bedside commode.   - Consulted CM in hopes of earlier discharge with outpatient HD but this is not possible.  # Code Status: Full code   Dewain Penning, MD 02/25/2013, 12:18 PM

## 2013-02-25 NOTE — Progress Notes (Signed)
[facility] ROUNDING NOTE   ASSESSMENT/RECOMMENDATIONS  Pt is a 62 y.o. yo female with a PMHX of DM II, with prior history emphysematous pyelonephritis-- s/p right nephrectomy in 2012, admitted to [facility] on 02/13/2013 with nausea, vomiting, diarrhea for 2 weeks resulting in AKI with associated metabolic acidosis and hyperkalemia. Initially felt d/t hypovolemia with BUN 101, Cr 8.42. (Baseline Cr from 03/20/11-09/14/11 trended from 0.51-0.93, but no labs in interim), and did not respond to IV fluid hydration, requiring initiation of HD 02/17/13 for volume and azotemia   Renal failure (presume AKI) in patient with solitary kidney Kidney normal size by ultrasound and GU not suspicious of anything acc to Dr. McDiarmid's note.  Urine EOS--still pending (unclear that lab ever received),  ANA, ANCA and antiGBM all negative  IVF did not reverse potential prerenal problem.  HD dep since 4/4  Fem cath (4/4) removed 4/9 S/p TDC  (4/10) Last HD 4/10 HD prn   Check labs QAM through the weekend - no HD planned for Sat or Sun  Cannot be discharged until clearly recovers or not (has no means of payment for outpt HD at present even if we declared her ESRD which we cannot do at this stage as she is "medicaid pending")  So will have to remain in house for observation and prn HD until course declares itself  Etiology of her AKI still not readily apparent to me (assuming that it is acute) unless just slow to resolve ATN in her solitary kidney   CKD-MBD On calcium carbonate PTH 334 Will use oral calcitriol for now, since unclear if ESRD or not and dialysis not on a schedule  Anemia  FOBT neg.  Fe panel sat 23%, ferritin 495, folate 8.4.  On Aranesp 100/week (QWED) Received Feraheme 510 IV on 4/2.   HTN/Vol  BP OK Last CXR with small effusions and some interstitial edema Decreased vol further with last HD  Vomiting  Better On protonix/reglan (Dr. Cristina Gong does not want to continue long  term).  EGD negative  Gast emptying study c/w gastroparesis  UTI  E Coli  S/p rocephin   Subjective:  No new complaints No nausea "ate a big roughage meal" yesterday Last HD 4/10 via new TDC (placed 4/10)  Objective:   Vital signs in last 24 hours: Filed Vitals:   02/24/13 2155 02/25/13 0500 02/25/13 0554 02/25/13 1000  BP:  174/73 148/78 151/64  Pulse:  71  72  Temp:  97.8 F (36.6 C)  98 F (36.7 C)  TempSrc:  Oral  Oral  Resp:  17  18  Height:      Weight: 82.509 kg (181 lb 14.4 oz)     SpO2:  99%  98%   Weight change: -0.491 kg (-1 lb 1.3 oz)  Intake/Output Summary (Last 24 hours) at 02/25/13 1315 Last data filed at 02/25/13 0900  Gross per 24 hour  Intake    480 ml  Output   1340 ml  Net   -860 ml   Weights:  02/24/13 2155 82.509 kg  02/23/13 2059 82.7 kg  02/22/13 0303 83.5 kg  02/22/13 0228 83 kg  02/21/13 2240 82.8 kg  02/21/13 0800 82.7 kg  02/18/13 2135 83.7 kg  02/18/13 1930 85.3 kg  02/17/13 1604 86.6 kg  02/17/13 1145 88.9 kg  02/16/13 0715 87.9 kg  02/14/13 0030 83.3 kg   Physical Exam:  Blood pressure 151/64, pulse 72, temperature 98 F (36.7 C), temperature source Oral, resp. rate 18,  height 5\' 5"  (1.651 m), weight 82.509 kg (181 lb 14.4 oz), SpO2 98.00%. Hard of hearing NAD   Not wearing oxygen OOB in chair Right TDC clean and dry Lungs grossly clear S1S2 no S3 Abdomen soft and non tender 1+ edema LE's  Recent Labs Lab 02/19/13 0747 02/19/13 1518 02/20/13 0445 02/21/13 0522 02/22/13 0450 02/23/13 0500 02/25/13 0545  NA 134* 133* 135 131* 137 133* 132*  K 4.4 4.8 4.4 4.6 3.7 3.5 4.9  CL 93* 93* 95* 94* 100 97 97  CO2 26 26 28 27 28 28 28   GLUCOSE 95 126* 122* 121* 109* 102* 123*  BUN 41* 42* 44* 47* 12 16 15   CREATININE 5.24* 5.37* 5.87* 6.44* 2.51* 3.74* 3.47*  CALCIUM 8.2* 8.2* 8.2* 7.8* 7.4* 7.6* 8.1*  PHOS 4.2  --  4.2 3.9 2.2* 2.9 2.7  Results for HARLENE, PETRALIA (MRN [id number]) as of 02/25/2013 13:18  Ref. Range  02/23/2013 09:50  PTH Latest Range: 14.0-72.0 pg/mL 334.7 (H)    Recent Labs Lab 02/19/13 1518  02/22/13 0450 02/23/13 0500 02/25/13 0545  AST 10  --   --   --   --   ALT <5  --   --   --   --   ALKPHOS 116  --   --   --   --   BILITOT 0.2*  --   --   --   --   PROT 6.7  --   --   --   --   ALBUMIN 2.6*  < > 2.2* 2.3* 2.6*  < > = values in this interval not displayed.  Recent Labs Lab 02/20/13 1536  LIPASE 21    Recent Labs Lab 02/22/13 0450 02/23/13 0500 02/24/13 0918 02/25/13 0545  WBC 9.7 9.9 8.7 7.9  HGB 8.1* 8.0* 8.5* 8.5*  HCT 26.0* 25.6* 28.1* 27.7*  MCV 95.9 95.9 97.9 96.5  PLT 189 197 171 191    Recent Labs Lab 02/24/13 1105 02/24/13 1635 02/24/13 2153 02/25/13 0736 02/25/13 1140  GLUCAP 184* 108* 131* 116* 145*  Results for MALILLANY, KAZLAUSKAS (MRN [id number]) as of 02/20/2013 15:11   Ref. Range  02/18/2013 11:33   ANA  Latest Range: NEGATIVE  NEGATIVE   GBM Ab  Latest Range: <20 AU/mL  <1   Myeloperoxidase Abs  Latest Range: <20 AU/mL  <1   Serine Protease 3  Latest Range: <20 AU/mL  <1   Results for REHEMA, MUFFLEY (MRN [id number]) as of 02/20/2013 15:11   Ref. Range  02/17/2013 04:04  02/17/2013 16:37   Hepatitis B Surface Ag  Latest Range: NEGATIVE  NEGATIVE    Hep B S Ab  Latest Range: NONREACTIVE  NONREACTIVE    Hep B Core Total Ab  Latest Range: NEGATIVE  NEGATIVE    H Pylori IgG  No range found   2.24 (H)   Results for ELYSA, WOMAC (MRN [id number]) as of 02/22/2013 12:28   Ref. Range  02/14/2013 04:49   Iron  Latest Range: 42-135 ug/dL  31 (L)   UIBC  Latest Range: 125-400 ug/dL  106 (L)   TIBC  Latest Range: 250-470 ug/dL  137 (L)   Saturation Ratios  Latest Range: 20-55 %  23   Ferritin  Latest Range: 10-291 ng/mL  495 (H)   Folate  No range found  8.4     Studies/Results: No results found.    Marland Kitchen antiseptic oral rinse  15 mL Mouth Rinse BID  .  aspirin EC  81 mg Oral Daily  . calcium carbonate  1 tablet Oral TID WC  . darbepoetin (ARANESP)  injection - NON-DIALYSIS  100 mcg Subcutaneous Q Wed-1800  . heparin lock flush  2,800 Units Intravenous Once  . insulin aspart  0-9 Units Subcutaneous TID WC  . metoCLOPramide  5 mg Oral TID AC  . pantoprazole  40 mg Oral Daily  . sodium chloride  3 mL Intravenous Q12H     I  have reviewed scheduled and prn medications.  Jamal Maes, MD [place] Pager 02/25/2013, 1:15 PM

## 2013-02-26 DIAGNOSIS — K5900 Constipation, unspecified: Secondary | ICD-10-CM

## 2013-02-26 LAB — CBC
HCT: 28 % — ABNORMAL LOW (ref 36.0–46.0)
MCH: 29.7 pg (ref 26.0–34.0)
MCHC: 31.1 g/dL (ref 30.0–36.0)
MCV: 95.6 fL (ref 78.0–100.0)
RDW: 14.4 % (ref 11.5–15.5)

## 2013-02-26 LAB — GLUCOSE, CAPILLARY: Glucose-Capillary: 165 mg/dL — ABNORMAL HIGH (ref 70–99)

## 2013-02-26 LAB — RENAL FUNCTION PANEL
Albumin: 2.5 g/dL — ABNORMAL LOW (ref 3.5–5.2)
BUN: 22 mg/dL (ref 6–23)
Creatinine, Ser: 4.53 mg/dL — ABNORMAL HIGH (ref 0.50–1.10)
Glucose, Bld: 130 mg/dL — ABNORMAL HIGH (ref 70–99)
Phosphorus: 2.3 mg/dL (ref 2.3–4.6)

## 2013-02-26 MED ORDER — METOCLOPRAMIDE HCL 5 MG PO TABS
5.0000 mg | ORAL_TABLET | Freq: Every day | ORAL | Status: DC
  Administered 2013-02-27 – 2013-03-08 (×9): 5 mg via ORAL
  Filled 2013-02-26 (×11): qty 1

## 2013-02-26 MED ORDER — POLYETHYLENE GLYCOL 3350 17 G PO PACK
17.0000 g | PACK | Freq: Two times a day (BID) | ORAL | Status: DC | PRN
  Administered 2013-02-26 (×2): 17 g via ORAL
  Filled 2013-02-26: qty 1

## 2013-02-26 NOTE — Progress Notes (Signed)
Family Medicine Teaching Service Daily Progress Note Service Page: [phone]  Subjective:  Complains today of no bowel movement in 1 week and itching, though just got atarax 5 min ago. Also mood is down after hearing about her kidney injury. Tolerating PO and wants to walk.  Medications reviewed: No PRN over 24 hours.  Objective: BP 106/52  Pulse 81  Temp(Src) 97.9 F (36.6 C) (Oral)  Resp 16  Ht 5\' 5"  (1.651 m)  Wt 181 lb 14.4 oz (82.509 kg)  BMI 30.27 kg/m2  SpO2 91%   Exam: General: resting in chair by bed, NAD, talkative Cardiovascular: RRR. No murmurs, rubs, or gallops though distant. Respiratory: CTAB upper lung fields, decreased breath sounds lower. No wheezing appreciated. No increased work of breathing.  Abdomen: obese, soft, nondistended, NT, NABS.  Extremities: Trace LE edema, tenderness to palpation at ankles and shin but not calves, no cord, no erythema or size difference, SCD not in place today (continue to encourage), 2+ bilateral DP pulses  NEURO: Awake, alert, normal speech, no focal findings PSYCH: Mood "kind of down", affect congruent SKIN: no rash appreciated   Intake/Output Summary (Last 24 hours) at 02/26/13 0825 Last data filed at 02/26/13 0455  Gross per 24 hour  Intake    480 ml  Output   1040 ml  Net   -560 ml    Labs/Imaging -  Urine culture 4/10 - negative    Recent Labs Lab 02/24/13 0918 02/25/13 0545 02/26/13 0440  HGB 8.5* 8.5* 8.7*  HCT 28.1* 27.7* 28.0*  WBC 8.7 7.9 8.6  PLT 171 191 199    Recent Labs Lab 02/21/13 0522 02/22/13 0450 02/23/13 0500 02/25/13 0545 02/26/13 0440  NA 131* 137 133* 132* 133*  K 4.6 3.7 3.5 4.9 4.0  CL 94* 100 97 97 96  CO2 27 28 28 28 28   GLUCOSE 121* 109* 102* 123* 130*  BUN 47* 12 16 15 22   CREATININE 6.44* 2.51* 3.74* 3.47* 4.53*  CALCIUM 7.8* 7.4* 7.6* 8.1* 8.7  PHOS 3.9 2.2* 2.9 2.7 2.3    Echocardiogram 4/8: Normal systolic function (EF 91-50%), trivial mitral regurgitation, mild  tricuspid regurgitation Gastric emptying study: Moderate slowing  4/10 IR fluoro - guided CV line RIGHT: IMPRESSION:  1. Technically successful placement of tunneled right IJ  hemodialysis catheter with ultrasound and fluoroscopic guidance.  Ready for routine use.  Assessment and Plan: PIA JEDLICKA is a 62 y.o. female presenting with PMH of DM-2, muscle spasms, and emphysematous pylenephritis s/p right nephrectomy who presented with nausea/vomiting for approximately 2 weeks. On admission found to have acute kidney injury with associated metabolic acidosis and hyperkalemia, as well as intractable n/v possibly secondary to azotemia.   # AKI - Likely prerenal ?slowly resolving ATN in solitary kidney. Creatinine improving with dialysis bu still slightly worsened to 4.5 today. BP tenuous, and pt not on any antihypertensives. -E coli UTI likely contributed but now is s/p treatment of CTX x 10 days.  - Renal following and we greatly appreciate further recommendations.      - 4/5, 4/6, 4/8, 4/10    - ANCA, anti GBM, ANA  Negative, urine eosinophils pending  - Continue calcium carbonate for low calcium  - Remain in house while waiting improvement vs 6 weeks  - IS to bedside, encourage OOB  # Anemia - Hgb stable at 8.4. Iron panel with sat 23%, ferritin 495, folate 8.4; IV Fe per renal, FOBT neg x 2  - IV iron, aranesp -  Will monitor closely during admission. - Transfuse for Hgb <7  # Hyperkalemia with mild T wave peaking on EKG- s/p kayexalate with resultant hypokalemia, dosed IV K x 1, resolved; normal Mg.  # Itching - Severe itching, prn atarax - Discontinue when possible  # Atrial fibrillation with RVR - Brief episode 4/8 with vomiting, started diltiazem drip and pt came out of episode. With borderline low blood pressures, stopped diltiazem drip and pt stable with no further episodes.  # Metabolic acidosis - Thought to be likely from diarrhea. Corrected after IV fluids with bicarbonate    # UTI  - Urine Cx resulted >100,000 cfu e coli, pan-sensitive; 2nd culture 4/5 again with 5000 CFU e coli - Rcd CTX 10 days course due to h/o emphysematous pyelo - Neg 4/10 culture   # Nausea/vomiting/diarrhea - Improved today; likely from azotemia and gastroparesis - GI consulted and found normal EGD and gastric emptying study with moderate slowing - H pylori IgG positive - will hold off on treatment as no EGD findings and do not want to muddy the waters with potential for c diff infection or GI symptoms with antibiotics if not needed - Zofran PRN, phenergan prn - Protonix daily - Reglan plan to taper off and GI signed off 4/11; greatly appreciate their input   # DM-2 - A1c 6.1; - CBG's TID AC and QHS  - Sensitive SSI   # Muscle spasms of back  - Tylenol PRN; will refrain from flexeril due to anticholinergic effects   # Hypertension - Still elevated today to SBP 150s and down to 100s - Continue to monitor.   # Diverticulosis with no evidence of diverticulitis on CT  # CHD risk with DM Type II and HTN - continue low-dose aspirin  # FEN/GI: SLIV; diet carb modified with low sodium  # Prophylaxis: SCDs - encourage pt to wear daily; encourage OOB with nurse/walker  # Disposition: Pending clinical improvement in AKI - PT- home health PT with rolling walker and 5" wheels; OT - 3-in-1 bedside commode.   - Consulted CM in hopes of earlier discharge with outpatient HD but this is not possible. - Will be important to f/u closely with PCP  # Code Status: Full code   Conni Slipper, MD 02/26/2013, 8:25 AM

## 2013-02-26 NOTE — Progress Notes (Signed)
Seen and examined.  Stable.  Continue inpatient dialysis until recovery for AKI.

## 2013-02-26 NOTE — Progress Notes (Signed)
**[name] [name]** [name] [name] [name]   ASSESSMENT/RECOMMENDATIONS  Pt is a 62 y.o. yo female with a PMHX of DM II, with prior history emphysematous pyelonephritis-- s/p right nephrectomy in 2012, admitted to [facility] on 02/13/2013 with nausea, vomiting, diarrhea for 2 weeks resulting in AKI with associated metabolic acidosis and hyperkalemia. Initially felt d/t hypovolemia with BUN 101, Cr 8.42. (Baseline Cr from 03/20/11-09/14/11 trended from 0.51-0.93, but no labs in interim), and did not respond to IV fluid hydration, requiring initiation of HD 02/17/13 for volume and azotemia   Renal failure (presume AKI) in patient with solitary kidney Kidney normal size by ultrasound and GU not suspicious of anything acc to Dr. McDiarmid's [name] [name].  Urine EOS--still pending (unclear that lab ever received),  ANA, ANCA and antiGBM all negative  IVF did not reverse potential prerenal problem.  HD dep since 4/4  Fem cath (4/4) removed 4/9 S/p TDC  (4/10) Last HD 4/10 HD prn pending recovery Hesitate to bx for dx/prognosis with solitary kidney  Checking  labs QAM through the weekend - no HD planned for this weekend as we observe renal function - anticipate will HD on Monday based on lab trending so far but will hold off on writing orders pending Monday AM labs  Cannot be discharged until clearly recovers or not (has no means of payment for outpt HD at present even if we declared her ESRD which we cannot do at this stage/ she is "medicaid pending") Tells me today that she will have BCBS May 1 through the [facility]  So will have to remain in house for observation and prn HD until course declares itself   Etiology of her AKI still not readily apparent to me (assuming that it is acute) unless just slow to resolve ATN in her solitary kidney   CKD-MBD On calcium carbonate PTH 334 Will use oral calcitriol for now, since unclear if ESRD or not and dialysis not on a schedule  Anemia  FOBT neg.  Fe panel sat 23%,  ferritin 495, folate 8.4.  On Aranesp 100/week (QWED) Received Feraheme 510 IV on 4/2.   HTN/Vol  BP OK Last CXR with small effusions and some interstitial edema Decreased vol further with last HD  Vomiting  Better On protonix/reglan (Dr. Cristina Gong does not want to continue long term).  EGD negative  Gast emptying study c/w gastroparesis  UTI  E Coli  S/p rocephin   Subjective:  Last HD 4/10 via new TDC (placed 4/10) Tearful about her renal situation Feeling guilt about not seeing MD for 2 years  Objective:   Vital signs in last 24 hours: Filed Vitals:   02/25/13 1812 02/25/13 2046 02/26/13 0455 02/26/13 0938  BP: 146/68 131/110 106/52 112/50  Pulse: 78 66 81 70  Temp: 98.6 F (37 C) 98.8 F (37.1 C) 97.9 F (36.6 C) 98.3 F (36.8 C)  TempSrc: Oral Oral Oral Oral  Resp: 18 19 16 16   Height:      Weight:      SpO2: 98% 98% 91% 98%   Weight change:   Intake/Output Summary (Last 24 hours) at 02/26/13 0957 Last data filed at 02/26/13 0833  Gross per 24 hour  Intake    480 ml  Output    840 ml  Net   -360 ml   Weights:  02/24/13 2155 82.509 kg  02/23/13 2059 82.7 kg  02/22/13 0303 83.5 kg  02/22/13 0228 83 kg  02/21/13 2240 82.8 kg  02/21/13 0800 82.7 kg  02/18/13 2135 83.7 kg  02/18/13 1930 85.3 kg  02/17/13 1604 86.6 kg  02/17/13 1145 88.9 kg  02/16/13 0715 87.9 kg  02/14/13 0030 83.3 kg   Physical Exam:  Blood pressure 112/50, pulse 70, temperature 98.3 F (36.8 C), temperature source Oral, resp. rate 16, height 5\' 5"  (1.651 m), weight 82.509 kg (181 lb 14.4 oz), SpO2 98.00%. Hard of hearing NAD   Not wearing oxygen OOB in chair Right TDC clean and dry Lungs grossly clear S1S2 no S3 Abdomen soft and non tender 1+ edema LE's  Recent Labs Lab 02/19/13 1518 02/20/13 0445 02/21/13 0522 02/22/13 0450 02/23/13 0500 02/25/13 0545 02/26/13 0440  NA 133* 135 131* 137 133* 132* 133*  K 4.8 4.4 4.6 3.7 3.5 4.9 4.0  CL 93* 95* 94* 100 97 97 96   CO2 26 28 27 28 28 28 28   GLUCOSE 126* 122* 121* 109* 102* 123* 130*  BUN 42* 44* 47* 12 16 15 22   CREATININE 5.37* 5.87* 6.44* 2.51* 3.74* 3.47* 4.53*  CALCIUM 8.2* 8.2* 7.8* 7.4* 7.6* 8.1* 8.7  PHOS  --  4.2 3.9 2.2* 2.9 2.7 2.3  Results for [name] [name] [name], [name] [name] [name] (MRN [id number]) as of 02/25/2013 13:18  Ref. Range 02/23/2013 09:50  PTH Latest Range: 14.0-72.0 pg/mL 334.7 (H)    Recent Labs Lab 02/19/13 1518  02/23/13 0500 02/25/13 0545 02/26/13 0440  AST 10  --   --   --   --   ALT <5  --   --   --   --   ALKPHOS 116  --   --   --   --   BILITOT 0.2*  --   --   --   --   PROT 6.7  --   --   --   --   ALBUMIN 2.6*  < > 2.3* 2.6* 2.5*  < > = values in this interval not displayed.  Recent Labs Lab 02/20/13 1536  LIPASE 21    Recent Labs Lab 02/23/13 0500 02/24/13 0918 02/25/13 0545 02/26/13 0440  WBC 9.9 8.7 7.9 8.6  HGB 8.0* 8.5* 8.5* 8.7*  HCT 25.6* 28.1* 27.7* 28.0*  MCV 95.9 97.9 96.5 95.6  PLT 197 171 191 199    Recent Labs Lab 02/25/13 0736 02/25/13 1140 02/25/13 1626 02/25/13 2128 02/26/13 0733  GLUCAP 116* 145* 140* 152* 153*  Results for [name] [name] [name], [name] [name] [name] (MRN [id number]) as of 02/20/2013 15:11   Ref. Range  02/18/2013 11:33   ANA  Latest Range: NEGATIVE  NEGATIVE   GBM Ab  Latest Range: <20 AU/mL  <1   Myeloperoxidase Abs  Latest Range: <20 AU/mL  <1   Serine Protease 3  Latest Range: <20 AU/mL  <1   Results for [name] [name] [name], [name] [name] [name] (MRN [id number]) as of 02/20/2013 15:11   Ref. Range  02/17/2013 04:04  02/17/2013 16:37   Hepatitis B Surface Ag  Latest Range: NEGATIVE  NEGATIVE    Hep B S Ab  Latest Range: NONREACTIVE  NONREACTIVE    Hep B Core Total Ab  Latest Range: NEGATIVE  NEGATIVE    H Pylori IgG  No range found   2.24 (H)   Results for [name] [name] [name], [name] [name] [name] (MRN [id number]) as of 02/22/2013 12:28   Ref. Range  02/14/2013 04:49   Iron  Latest Range: 42-135 ug/dL  31 (L)   UIBC  Latest Range: 125-400 ug/dL  106 (L)   TIBC  Latest Range: 250-470 ug/dL  137 (L)  Saturation Ratios  Latest Range: 20-55 %  23   Ferritin  Latest Range: 10-291 ng/mL  495 (H)   Folate  No range found  8.4     Studies/Results: No results found.    Marland Kitchen antiseptic oral rinse  15 mL Mouth Rinse BID  . aspirin EC  81 mg Oral Daily  . calcitRIOL  0.5 mcg Oral Daily  . calcium carbonate  1 tablet Oral TID WC  . darbepoetin (ARANESP) injection - NON-DIALYSIS  100 mcg Subcutaneous Q Wed-1800  . heparin lock flush  2,800 Units Intravenous Once  . insulin aspart  0-9 Units Subcutaneous TID WC  . metoCLOPramide  5 mg Oral TID AC  . pantoprazole  40 mg Oral Daily  . sodium chloride  3 mL Intravenous Q12H     I  have reviewed scheduled and prn medications.  Jamal Maes, MD [facility] [phone] Pager 02/26/2013, 9:57 AM

## 2013-02-27 LAB — CBC
HCT: 27.4 % — ABNORMAL LOW (ref 36.0–46.0)
Hemoglobin: 8.6 g/dL — ABNORMAL LOW (ref 12.0–15.0)
MCV: 94.2 fL (ref 78.0–100.0)
RBC: 2.91 MIL/uL — ABNORMAL LOW (ref 3.87–5.11)
RDW: 14.9 % (ref 11.5–15.5)
WBC: 10.4 10*3/uL (ref 4.0–10.5)

## 2013-02-27 LAB — RENAL FUNCTION PANEL
Albumin: 2.5 g/dL — ABNORMAL LOW (ref 3.5–5.2)
BUN: 31 mg/dL — ABNORMAL HIGH (ref 6–23)
CO2: 29 mEq/L (ref 19–32)
Chloride: 94 mEq/L — ABNORMAL LOW (ref 96–112)
Glucose, Bld: 141 mg/dL — ABNORMAL HIGH (ref 70–99)
Potassium: 4.8 mEq/L (ref 3.5–5.1)

## 2013-02-27 LAB — GLUCOSE, CAPILLARY
Glucose-Capillary: 145 mg/dL — ABNORMAL HIGH (ref 70–99)
Glucose-Capillary: 186 mg/dL — ABNORMAL HIGH (ref 70–99)

## 2013-02-27 MED ORDER — HEPARIN SODIUM (PORCINE) 5000 UNIT/ML IJ SOLN: 5000.0000 [IU] | Freq: Three times a day (TID) | INTRAMUSCULAR | Status: DC

## 2013-02-27 MED ORDER — POLYETHYLENE GLYCOL 3350 17 G PO PACK
34.0000 g | PACK | Freq: Two times a day (BID) | ORAL | Status: DC
  Administered 2013-02-27 – 2013-03-01 (×5): 34 g via ORAL
  Filled 2013-02-27 (×8): qty 2

## 2013-02-27 MED ORDER — HEPARIN SODIUM (PORCINE) 5000 UNIT/ML IJ SOLN
5000.0000 [IU] | Freq: Three times a day (TID) | INTRAMUSCULAR | Status: DC
  Administered 2013-02-27 – 2013-03-06 (×20): 5000 [IU] via SUBCUTANEOUS
  Filled 2013-02-27 (×27): qty 1

## 2013-02-27 MED ORDER — POLYETHYLENE GLYCOL 3350 17 G PO PACK: 34.0000 g | PACK | Freq: Two times a day (BID) | ORAL | Status: DC

## 2013-02-27 NOTE — Progress Notes (Signed)
ASSESSMENT/RECOMMENDATIONS  Pt is a 62 y.o. yo female with a PMHX of DM II, with prior history emphysematous pyelonephritis-- s/p right nephrectomy in 2012, admitted to [facility] on 02/13/2013 with nausea, vomiting, diarrhea for 2 weeks resulting in AKI with associated metabolic acidosis and hyperkalemia. Initially felt d/t hypovolemia with BUN 101, Cr 8.42. (Baseline Cr from 03/20/11-09/14/11 trended from 0.51-0.93, but no labs in interim), and did not respond to IV fluid hydration, requiring initiation of HD 02/17/13 for volume and azotemia   Acute Renal failure (presume AKI) in patient with solitary kidney, hemodynamic etio suspected   HD dep since 4/4 , Fem cath (4/4) removed 4/9 , S/p TDC (4/10) , Last HD 4/10 and will do again today for volume; HD prn pending recovery , no planned bx for dx/prognosis with solitary kidney. Cannot be discharged until clearly recovers or not (has no means of payment for outpt HD at present even if we declared her ESRD which we cannot do at this stage/ she is "medicaid pending") Tells me today that she will have BCBS May 1 through the [employer]  So will have to remain in house for observation and prn HD until course declares itself   CKD-MBD  On calcium carbonate PTH 334 Oral calcitriol for now, since unclear if ESRD or not and dialysis not on a schedule  Anemia On Aranesp 100/week (QWED) Received Feraheme 510 IV on 4/2.  HTN/Vol  Vomiting Gast emptying study c/w gastroparesis  UTI E Coli S/p rocephin  Subjective: Interval History: No complaints  Objective: Vital signs in last 24 hours: Temp:  [97.7 F (36.5 C)-98.6 F (37 C)] 98.6 F (37 C) (04/14 0523) Pulse Rate:  [66-70] 70 (04/14 0523) Resp:  [16-18] 16 (04/14 0523) BP: (123-169)/(56-70) 149/56 mmHg (04/14 0523) SpO2:  [91 %-95 %] 92 % (04/14 0523) Weight:  [82.373 kg (181 lb 9.6 oz)] 82.373 kg (181 lb 9.6 oz) (04/13 2119) Weight change:   Intake/Output from previous day: 04/13 0701 - 04/14 0700 In: 720  [P.O.:720] Out: 1050 [Urine:1050] Intake/Output this shift:    General appearance: alert and cooperative Resp: clear to auscultation bilaterally Chest wall: no tenderness Cardio: regular rate and rhythm, S1, S2 normal, no murmur, click, rub or gallop GI: soft, non-tender; bowel sounds normal; no masses,  no organomegaly Extremities: edema 1+  Lab Results:  Recent Labs  02/26/13 0440 02/27/13 0720  WBC 8.6 10.4  HGB 8.7* 8.6*  HCT 28.0* 27.4*  PLT 199 225   BMET:  Recent Labs  02/26/13 0440 02/27/13 0720  NA 133* 130*  K 4.0 4.8  CL 96 94*  CO2 28 29  GLUCOSE 130* 141*  BUN 22 31*  CREATININE 4.53* 5.33*  CALCIUM 8.7 9.2   No results found for this basename: PTH,  in the last 72 hours Iron Studies: No results found for this basename: IRON, TIBC, TRANSFERRIN, FERRITIN,  in the last 72 hours Studies/Results: No results found.  Scheduled: . antiseptic oral rinse  15 mL Mouth Rinse BID  . aspirin EC  81 mg Oral Daily  . calcitRIOL  0.5 mcg Oral Daily  . calcium carbonate  1 tablet Oral TID WC  . darbepoetin (ARANESP) injection - NON-DIALYSIS  100 mcg Subcutaneous Q Wed-1800  . heparin lock flush  2,800 Units Intravenous Once  . insulin aspart  0-9 Units Subcutaneous TID WC  . metoCLOPramide  5 mg Oral QAC breakfast  . pantoprazole  40 mg Oral Daily  . polyethylene glycol  34 g  Oral BID  . sodium chloride  3 mL Intravenous Q12H     LOS: 14 days   [name] [name] C 02/27/2013,9:55 AM

## 2013-02-27 NOTE — Progress Notes (Signed)
Physical Therapy Treatment Patient Details Name: [name] [name] MRN: [id number] DOB: 1951-02-03 Today's Date: 02/27/2013 Time: 6213-0865 PT Time Calculation (min): 25 min  PT Assessment / Plan / Recommendation Comments on Treatment Session  pt continues to improve in mobility and gait with W    Follow Up Recommendations  Home health PT     Does the patient have the potential to tolerate intense rehabilitation     Barriers to Discharge        Equipment Recommendations  Rolling walker with 5" wheels    Recommendations for Other Services    Frequency Min 3X/week   Plan Discharge plan remains appropriate    Precautions / Restrictions     Pertinent Vitals/Pain No c/o pain    Mobility  Bed Mobility Details for Bed Mobility Assistance: Pt in recliner at beginning of session Transfers Sit to Stand: 6: Modified independent (Device/Increase time);With armrests;From chair/3-in-1 Stand to Sit: 6: Modified independent (Device/Increase time);With armrests;To chair/3-in-1 Details for Transfer Assistance: No instruction required for safe hand placement Ambulation/Gait Ambulation/Gait Assistance: 5: Supervision Ambulation Distance (Feet): 500 Feet (200, 300) Assistive device: Rolling walker Ambulation/Gait Assistance Details: Pt doing well with RW Gait Pattern: Step-through pattern;Trunk flexed Gait velocity: pt needs consistent cues to  extend upper trunk and hips to improve posture. General Gait Details: Pt doing better with RW, no dyspnea noted today.  Pt tends to stay flexed over RW despite cues to stand erect Stairs: No Wheelchair Mobility Wheelchair Mobility: No    Exercises General Exercises - Lower Extremity Ankle Circles/Pumps: AROM;Both;10 reps;Seated Long Arc Quad: AROM;Both;10 reps;Seated Hip Flexion/Marching: AROM;Both;10 reps;Seated Other Exercises Other Exercises: standing balance for one minute without hand hold assist pt encouraged to activate gluteals   PT  Diagnosis:    PT Problem List:   PT Treatment Interventions:     PT Goals Acute Rehab PT Goals PT Goal Formulation: With patient Time For Goal Achievement: 02/28/13 Potential to Achieve Goals: Good Pt will go Sit to Stand: with modified independence PT Goal: Sit to Stand - Progress: Met Pt will go Stand to Sit: with modified independence PT Goal: Stand to Sit - Progress: Met Pt will Transfer Bed to Chair/Chair to Bed: with modified independence Pt will Ambulate: >150 feet;with modified independence;with least restrictive assistive device PT Goal: Ambulate - Progress: Progressing toward goal  Visit Information  Last PT Received On: 02/27/13 Assistance Needed: +1    Subjective Data  Subjective: I want to do as much as I can to get stronger Patient Stated Goal: To go home and be independent.   Cognition  Cognition Overall Cognitive Status: Appears within functional limits for tasks assessed/performed Arousal/Alertness: Awake/alert Orientation Level: Oriented X4 / Intact Behavior During Session: WFL for tasks performed Cognition - Other Comments: pt HOH so have to speak loudly    Balance     End of Session PT - End of Session Activity Tolerance: Patient tolerated treatment well Patient left: in chair;with call bell/phone within reach   GP    Fusako Tanabe K. [place], [place] [phone] 02/27/2013, 12:46 PM

## 2013-02-27 NOTE — Progress Notes (Signed)
FMTS Attending Daily Note: Sanaiya Welliver MD [phone] pager office [phone] I have discussed this patient with the resident and reviewed the assessment and plan as documented above. I agree wit the resident's findings and plan.  

## 2013-02-27 NOTE — Progress Notes (Signed)
Family Medicine Teaching Service Daily Progress Note Service Page: [phone]  Subjective:  Ms Wiltse denies nausea/vomiting and is eating breakfast when I saw her today.  Has not had a BM since last week. Back is still very itchy but this happens at home too and she is unsure what she is sensitive to. Denies dysuria or chest pain.  Medications reviewed: No PRN over 24 hours.  Objective: BP 149/56  Pulse 70  Temp(Src) 98.6 F (37 C) (Oral)  Resp 16  Ht 5\' 5"  (1.651 m)  Wt 181 lb 9.6 oz (82.373 kg)  BMI 30.22 kg/m2  SpO2 92%   Exam:  General: resting in chair by bed, NAD, talkative Cardiovascular: RRR. No murmurs, rubs, or gallops though distant. Respiratory: CTAB upper lung fields, decreased breath sounds lower with fine crackles. No wheezing appreciated. No increased work of breathing.  Abdomen: obese, soft, nondistended, mildly tender right flank/RUQ, NABS.  Extremities: No LE edema, tenderness to palpation at ankles and shin but not calves, no cord, no erythema or size difference, SCD not in place today (continue to encourage) NEURO: Awake, alert, normal speech, no focal findings, hard of hearing PSYCH: Mood seems low, affect congruent SKIN: Back with mildly erythematous non-raised confluent rash with excoriations    Intake/Output Summary (Last 24 hours) at 02/27/13 0843 Last data filed at 02/27/13 0524  Gross per 24 hour  Intake    480 ml  Output   1050 ml  Net   -570 ml    Labs/Imaging -  Urine culture 4/10 - negative    Recent Labs Lab 02/25/13 0545 02/26/13 0440 02/27/13 0720  HGB 8.5* 8.7* 8.6*  HCT 27.7* 28.0* 27.4*  WBC 7.9 8.6 10.4  PLT 191 199 225    Recent Labs Lab 02/22/13 0450 02/23/13 0500 02/25/13 0545 02/26/13 0440 02/27/13 0720  NA 137 133* 132* 133* 130*  K 3.7 3.5 4.9 4.0 4.8  CL 100 97 97 96 94*  CO2 28 28 28 28 29   GLUCOSE 109* 102* 123* 130* 141*  BUN 12 16 15 22  31*  CREATININE 2.51* 3.74* 3.47* 4.53* 5.33*  CALCIUM 7.4*  7.6* 8.1* 8.7 9.2  PHOS 2.2* 2.9 2.7 2.3 2.3    Echocardiogram 4/8: Normal systolic function (EF 35-57%), trivial mitral regurgitation, mild tricuspid regurgitation Gastric emptying study: Moderate slowing  4/10 IR fluoro - guided CV line RIGHT: IMPRESSION:  1. Technically successful placement of tunneled right IJ  hemodialysis catheter with ultrasound and fluoroscopic guidance.  Ready for routine use.  Assessment and Plan: ONI DIETZMAN is a 62 y.o. female presenting with PMH of DM-2, muscle spasms, and emphysematous pylenephritis s/p right nephrectomy who presented with nausea/vomiting for approximately 2 weeks. On admission found to have acute kidney injury with associated metabolic acidosis and hyperkalemia, as well as intractable n/v possibly secondary to azotemia.   # AKI - Unclear etiology - Likely prerenal ?slowly resolving ATN in solitary kidney. Creatinine continues worsening, to 5.3  today. BP has been tenuous but is stable today. Not on antihypertensive. Per renal US and GU, no signs of obstruction. Not improved with IV fluids or with continued HD. Do not want to biopsy as this is pt's solitary kidney.  - Also today with hyponatremia to 130 - E coli UTI likely contributed but now is s/p treatment of CTX x 10 days.  - Renal following and we greatly appreciate further recommendations. Possible HD today? Per renal recs.    - 4/5, 4/6, 4/8, 4/10    -  ANCA, anti GBM, ANA  Negative, urine eosinophils pending  - Continue calcium carbonate for low calcium and calcitriol - Remain in house while waiting improvement vs 6 weeks   # Anemia - Hgb stable at 8.6. Iron panel with sat 23%, ferritin 495, folate 8.4; IV Fe per renal, FOBT neg x 2  - IV iron, aranesp - Will monitor closely during admission. - Transfuse for Hgb <7  # Hyperkalemia with mild T wave peaking on EKG- s/p kayexalate with resultant hypokalemia, dosed IV K x 1, resolved; normal Mg.  # Itching - Severe itching, prn  atarax - Discontinue when possible  # Atrial fibrillation with RVR - Brief episode 4/8 with vomiting, started diltiazem drip and pt came out of episode. With borderline low blood pressures, stopped diltiazem drip and pt stable with no further episodes.  # Metabolic acidosis - Thought to be likely from diarrhea. Corrected after IV fluids with bicarbonate   # UTI  - Urine Cx resulted >100,000 cfu e coli, pan-sensitive; 2nd culture 4/5 again with 5000 CFU e coli - Rcd CTX 10 days course due to h/o emphysematous pyelo - Neg 4/10 culture   # Nausea/vomiting/diarrhea - Improved today; likely from azotemia and gastroparesis - GI consulted and found normal EGD and gastric emptying study with moderate slowing - H pylori IgG positive - will hold off on treatment as no EGD findings and do not want to muddy the waters with potential for c diff infection or GI symptoms with antibiotics if not needed - Zofran PRN, phenergan prn - Protonix daily - Reglan plan to taper off (down to once daily today, d/c tomorrow) and GI signed off 4/11; greatly appreciate their input   # DM-2 - A1c 6.1; - CBG's TID AC and QHS  - Sensitive SSI   # Muscle spasms of back  - Tylenol PRN; will refrain from flexeril due to anticholinergic effects   # Hypertension - Stable today - Continue to monitor.   # Diverticulosis with no evidence of diverticulitis on CT  # CHD risk with DM Type II and HTN - continue low-dose aspirin  # Hard of hearing - Pt would benefit from hearing aid here or outpatient  # FEN/GI: SLIV; diet carb modified with low sodium - GI: No BM in 1 week - Increase to 34g miralax BID daily  # Prophylaxis: SCDs - encourage pt to wear daily; encourage OOB with nurse/walker - IS to bedside, encourage OOB  # Disposition: Pending clinical improvement in AKI - PT- home health PT with rolling walker and 5" wheels; OT - 3-in-1 bedside commode.   - Consulted CM in hopes of earlier discharge with outpatient  HD but this is not possible. - Will be important to f/u closely with PCP  # Code Status: Full code   Conni Slipper, MD 02/27/2013, 8:43 AM

## 2013-02-28 LAB — CBC
MCH: 30.1 pg (ref 26.0–34.0)
Platelets: 227 10*3/uL (ref 150–400)
RBC: 2.66 MIL/uL — ABNORMAL LOW (ref 3.87–5.11)

## 2013-02-28 LAB — RENAL FUNCTION PANEL
CO2: 30 mEq/L (ref 19–32)
Calcium: 8.4 mg/dL (ref 8.4–10.5)
GFR calc Af Amer: 15 mL/min — ABNORMAL LOW (ref >90)
GFR calc non Af Amer: 13 mL/min — ABNORMAL LOW (ref >90)
Phosphorus: 2.5 mg/dL (ref 2.3–4.6)
Potassium: 3.7 mEq/L (ref 3.5–5.1)
Sodium: 134 mEq/L — ABNORMAL LOW (ref 135–145)

## 2013-02-28 LAB — GLUCOSE, CAPILLARY
Glucose-Capillary: 120 mg/dL — ABNORMAL HIGH (ref 70–99)
Glucose-Capillary: 132 mg/dL — ABNORMAL HIGH (ref 70–99)
Glucose-Capillary: 154 mg/dL — ABNORMAL HIGH (ref 70–99)

## 2013-02-28 NOTE — Progress Notes (Signed)
Dr. Powell into see patient on rounds. 

## 2013-02-28 NOTE — Progress Notes (Signed)
FMTS Attending Daily Note: Rickia Freeburg MD [phone] pager office [phone] I  have seen and examined this patient, reviewed their chart. I have discussed this patient with the resident. I agree with the resident's findings, assessment and care plan. 

## 2013-02-28 NOTE — Progress Notes (Signed)
Utilization review completed.  

## 2013-02-28 NOTE — Progress Notes (Signed)
ASSESSMENT/RECOMMENDATIONS  Pt is a 62 y.o. yo female with a PMHX of DM II, with prior history emphysematous pyelonephritis-- s/p right nephrectomy in 2012, admitted to [facility] on 02/13/2013 with nausea, vomiting, diarrhea for 2 weeks resulting in AKI with associated metabolic acidosis and hyperkalemia. Initially felt d/t hypovolemia with BUN 101, Cr 8.42. (Baseline Cr from 03/20/11-09/14/11 trended from 0.51-0.93, but no labs in interim), and did not respond to IV fluid hydration, requiring initiation of HD 02/17/13 for volume and azotemia   1. Acute Renal failure (presume AKI) in patient with solitary kidney, hemodynamic etio suspected   Will see what happens without dialysis over next couple of days.  Also BP labile low and may benefit from PRBCs.  Subjective: Interval History: C/O malaise post treatment yesterday  Objective: Vital signs in last 24 hours: Temp:  [98 F (36.7 C)-99.1 F (37.3 C)] 98.6 F (37 C) (04/15 1000) Pulse Rate:  [46-120] 67 (04/15 1000) Resp:  [14-18] 18 (04/15 1000) BP: (70-170)/(34-110) 96/45 mmHg (04/15 1000) SpO2:  [93 %-96 %] 96 % (04/15 1000) Weight:  [78.472 kg (173 lb)-79.5 kg (175 lb 4.3 oz)] 78.472 kg (173 lb) (04/14 2046) Weight change: -2.873 kg (-6 lb 5.4 oz)  Intake/Output from previous day: 04/14 0701 - 04/15 0700 In: 720 [P.O.:720] Out: 1937  Intake/Output this shift: Total I/O In: 240 [P.O.:240] Out: -   General appearance: alert and cooperative Resp: clear to auscultation bilaterally Chest wall: no tenderness Cardio: regular rate and rhythm, S1, S2 normal, no murmur, click, rub or gallop GI: soft, non-tender; bowel sounds normal; no masses,  no organomegaly Extremities: edema 1+  Lab Results:  Recent Labs  02/27/13 0720 02/28/13 0635  WBC 10.4 8.1  HGB 8.6* 8.0*  HCT 27.4* 25.6*  PLT 225 227   BMET:  Recent Labs  02/27/13 0720 02/28/13 0635  NA 130* 134*  K 4.8 3.7  CL 94* 97  CO2 29 30  GLUCOSE 141* 132*  BUN 31* 16   CREATININE 5.33* 3.47*  CALCIUM 9.2 8.4   No results found for this basename: PTH,  in the last 72 hours Iron Studies: No results found for this basename: IRON, TIBC, TRANSFERRIN, FERRITIN,  in the last 72 hours Studies/Results: No results found.  Scheduled: . antiseptic oral rinse  15 mL Mouth Rinse BID  . aspirin EC  81 mg Oral Daily  . calcitRIOL  0.5 mcg Oral Daily  . calcium carbonate  1 tablet Oral TID WC  . darbepoetin (ARANESP) injection - NON-DIALYSIS  100 mcg Subcutaneous Q Wed-1800  . heparin subcutaneous  5,000 Units Subcutaneous Q8H  . heparin lock flush  2,800 Units Intravenous Once  . insulin aspart  0-9 Units Subcutaneous TID WC  . metoCLOPramide  5 mg Oral QAC breakfast  . pantoprazole  40 mg Oral Daily  . polyethylene glycol  34 g Oral BID  . sodium chloride  3 mL Intravenous Q12H    LOS: 15 days   Aleane Wesenberg C 02/28/2013,11:39 AM

## 2013-02-28 NOTE — Progress Notes (Signed)
Family Medicine Teaching Service Daily Progress Note Service Page: [phone]  Subjective:  Feels better today, had an episode of fatigue and nausea (no emesis) after PT and then hemodialysis yesterday that distressed her. Ate dinner and ate breakfast today. Had small BM yesterday.  Patient received hemodialysis yesterday due to overload.  Medications reviewed: PRN tylenol yesterday 7pm,   Objective: BP 132/59  Pulse 60  Temp(Src) 98.1 F (36.7 C) (Oral)  Resp 18  Ht 5\' 5"  (1.651 m)  Wt 173 lb (78.472 kg)  BMI 28.79 kg/m2  SpO2 95%   Exam:  General: resting in chair by bed, NAD, talkative Cardiovascular: RRR. No murmurs, rubs, or gallops though distant. Respiratory: CTAB upper lung fields, decreased breath sounds lower lobes. No wheezing appreciated. No increased work of breathing.  Abdomen: obese, soft, nondistended  Extremities: Trace LE edema, tenderness to palpation at ankles and shin but not calves, no cord, no erythema or size difference, SCD not in place NEURO: Awake, alert, normal speech, no focal findings, hard of hearing PSYCH: Mood seems low, affect congruent SKIN: No rash or cyanosis  Intake/Output Summary (Last 24 hours) at 02/28/13 0657 Last data filed at 02/28/13 0400  Gross per 24 hour  Intake    720 ml  Output   1937 ml  Net  -1217 ml    Labs/Imaging -  Urine culture 4/10 - negative    Recent Labs Lab 02/25/13 0545 02/26/13 0440 02/27/13 0720  HGB 8.5* 8.7* 8.6*  HCT 27.7* 28.0* 27.4*  WBC 7.9 8.6 10.4  PLT 191 199 225    Recent Labs Lab 02/22/13 0450 02/23/13 0500 02/25/13 0545 02/26/13 0440 02/27/13 0720  NA 137 133* 132* 133* 130*  K 3.7 3.5 4.9 4.0 4.8  CL 100 97 97 96 94*  CO2 28 28 28 28 29   GLUCOSE 109* 102* 123* 130* 141*  BUN 12 16 15 22  31*  CREATININE 2.51* 3.74* 3.47* 4.53* 5.33*  CALCIUM 7.4* 7.6* 8.1* 8.7 9.2  PHOS 2.2* 2.9 2.7 2.3 2.3   Urine culture pending  Echocardiogram 4/8: Normal systolic function (EF  30-86%), trivial mitral regurgitation, mild tricuspid regurgitation Gastric emptying study: Moderate slowing  4/10 IR fluoro - guided CV line RIGHT: IMPRESSION:  1. Technically successful placement of tunneled right IJ  hemodialysis catheter with ultrasound and fluoroscopic guidance.  Ready for routine use.  Assessment and Plan: [name] [name] is a 62 y.o. female presenting with PMH of DM-2, muscle spasms, and emphysematous pylenephritis s/p right nephrectomy who presented with nausea/vomiting for approximately 2 weeks. On admission found to have acute kidney injury with associated metabolic acidosis and hyperkalemia, as well as intractable n/v possibly secondary to azotemia.   # AKI - Unclear etiology - Prerenal ATN suspected though prolonged course, other etiology with solitary kidney?. Creatinine continues worsening, to 5.3 yest, renal panel pending today. BP has been tenuous. Not on antihypertensive. Per renal US and GU, no signs of obstruction. Not improved with IV fluids or with continued HD. Do not want to biopsy as this is pt's solitary kidney.  - F/u hyponatremia yest and creatinine with renal panel this AM - E coli UTI likely contributed but now is s/p treatment of CTX x 10 days.  - Renal following and we greatly appreciate further recommendations. Per renal recs.    - 4/5, 4/6, 4/8, 4/10, 4/14    - ANCA, anti GBM, ANA  Negative, urine eosinophils pending  - Continue calcium carbonate for low calcium and calcitriol -  Remain in house while waiting improvement vs 6 weeks   # Anemia - Hgb stable at 8.6. Iron panel with sat 23%, ferritin 495, folate 8.4; IV Fe per renal, FOBT neg x 2  - IV iron, aranesp - Will monitor closely during admission. - Transfuse for Hgb <7  # Hyperkalemia with mild T wave peaking on EKG- s/p kayexalate with resultant hypokalemia, dosed IV K x 1, resolved; normal Mg.  # Itching - Severe itching, prn atarax - Discontinue when possible  # Atrial  fibrillation with RVR - Brief episode 4/8 with vomiting, started diltiazem drip and pt came out of episode. With borderline low blood pressures, stopped diltiazem drip and pt stable with no further episodes.  # Metabolic acidosis - Thought to be likely from diarrhea. Corrected after IV fluids with bicarbonate   # UTI  - Urine Cx resulted >100,000 cfu e coli, pan-sensitive; 2nd culture 4/5 again with 5000 CFU e coli - Rcd CTX 10 days course due to h/o emphysematous pyelo - Neg 4/10 culture   # Nausea/vomiting/diarrhea - Improved today; likely from azotemia and gastroparesis - GI consulted and found normal EGD and gastric emptying study with moderate slowing - H pylori IgG positive - will hold off on treatment as no EGD findings and do not want to muddy the waters with potential for c diff infection or GI symptoms with antibiotics if not needed - Zofran PRN, phenergan prn - Protonix daily - Reglan plan to taper off (down to once daily yesterday, eventual d/c) and GI signed off 4/11; greatly appreciate their input   # DM-2 - A1c 6.1; - CBG's TID AC and QHS  - Sensitive SSI   # Muscle spasms of back  - Tylenol PRN; will refrain from flexeril due to anticholinergic effects   # Hypertension - Tenuous today 70s-170s/40-83 - Continue to monitor; HD yesterday when had low BP, stable this AM at 130s/50s  # Diverticulosis with no evidence of diverticulitis on CT  # CHD risk with DM Type II and HTN - continue low-dose aspirin  # Hard of hearing - Pt would benefit from hearing aid here or outpatient  # FEN/GI: SLIV; diet carb modified with low sodium - GI: Increased to 34g miralax BID daily - f/u BM. Small BM yesterday  # Prophylaxis: SCDs - encourage pt to wear daily; encourage OOB with nurse/walker - IS to bedside, encourage OOB with asst - started SQ heparin yesterday  # Disposition: Pending clinical improvement in AKI - PT- home health PT with rolling walker and 5" wheels; OT - 3-in-1  bedside commode.   - Consulted CM in hopes of earlier discharge with outpatient HD but this is not possible. - Will be important to f/u closely with PCP  # Code Status: Full code   Conni Slipper, MD 02/28/2013, 6:57 AM

## 2013-03-01 DIAGNOSIS — I959 Hypotension, unspecified: Secondary | ICD-10-CM

## 2013-03-01 DIAGNOSIS — M62838 Other muscle spasm: Secondary | ICD-10-CM

## 2013-03-01 LAB — CBC
HCT: 25.7 % — ABNORMAL LOW (ref 36.0–46.0)
Hemoglobin: 8.1 g/dL — ABNORMAL LOW (ref 12.0–15.0)
RBC: 2.73 MIL/uL — ABNORMAL LOW (ref 3.87–5.11)
WBC: 7 10*3/uL (ref 4.0–10.5)

## 2013-03-01 LAB — GLUCOSE, CAPILLARY: Glucose-Capillary: 118 mg/dL — ABNORMAL HIGH (ref 70–99)

## 2013-03-01 LAB — RENAL FUNCTION PANEL
BUN: 24 mg/dL — ABNORMAL HIGH (ref 6–23)
CO2: 27 mEq/L (ref 19–32)
Chloride: 94 mEq/L — ABNORMAL LOW (ref 96–112)
Glucose, Bld: 125 mg/dL — ABNORMAL HIGH (ref 70–99)
Potassium: 4.4 mEq/L (ref 3.5–5.1)

## 2013-03-01 NOTE — Progress Notes (Signed)
Physical Therapy Treatment Patient Details Name: [name] [name] MRN: [id number] DOB: July 21, 1951 Today's Date: 03/01/2013 Time: 7829-5621 PT Time Calculation (min): 27 min  PT Assessment / Plan / Recommendation Comments on Treatment Session  pt continues to improve and is motivated to get stronger.  Recommend continuned ambulation several times a day with nursing to increase general strength    Follow Up Recommendations  Home health PT     Does the patient have the potential to tolerate intense rehabilitation     Barriers to Discharge        Equipment Recommendations  Rolling walker with 5" wheels    Recommendations for Other Services    Frequency Min 3X/week   Plan Discharge plan remains appropriate    Precautions / Restrictions Precautions Precautions: Fall Restrictions Weight Bearing Restrictions: No   Pertinent Vitals/Pain C/o some pain in right neck from catheter insertion    Mobility  Bed Mobility Details for Bed Mobility Assistance: Pt in recliner at beginning of session Transfers Transfers: Sit to Stand;Stand to Sit Sit to Stand: 6: Modified independent (Device/Increase time);Not tested (comment);From chair/3-in-1 Stand to Sit: 6: Modified independent (Device/Increase time) Details for Transfer Assistance: pt having trouble with control of descent today even with multiple verbal cues to slow down and use legs to contol Ambulation/Gait Ambulation/Gait Assistance: 5: Supervision Ambulation Distance (Feet): 400 Feet Assistive device: Rolling walker Ambulation/Gait Assistance Details: frequent cues to extend trunk and stand erect Gait Pattern: Trunk flexed;Step-to pattern Gait velocity: pt needs consistent cues to  extend upper trunk and hips to improve posture. General Gait Details: pt with some dyspnea and fatigue at end of walk today Stairs: No Wheelchair Mobility Wheelchair Mobility: No    Exercises Other Exercises Other Exercises: standing balance for  several  minutes without hand hold assist pt encouraged to activate gluteals and extend upper trunk Other Exercises: issued written home program for beginning core and LE strengthening (copy placed in shadow chart)   PT Diagnosis:    PT Problem List:   PT Treatment Interventions:     PT Goals Acute Rehab PT Goals PT Goal Formulation: With patient Time For Goal Achievement: 03/15/13 Potential to Achieve Goals: Good Pt will go Sit to Stand: Independently PT Goal: Sit to Stand - Progress: Updated due to goal met Pt will go Stand to Sit: Independently PT Goal: Stand to Sit - Progress: Updated due to goals met Pt will Ambulate: Independently;>150 feet PT Goal: Ambulate - Progress: Updated due to goal met  Visit Information  Last PT Received On: 03/01/13 Assistance Needed: +1    Subjective Data  Subjective: I feel like my brain is turning to mush Patient Stated Goal: To go home and be independent.   Cognition  Cognition Arousal/Alertness: Awake/alert Behavior During Therapy: WFL for tasks assessed/performed Overall Cognitive Status: Within Functional Limits for tasks assessed General Comments: pt HOH so have to speak loudly    Balance     End of Session PT - End of Session Activity Tolerance: Patient tolerated treatment well Patient left: in chair;Other (comment) (MD in room)   GP    [name] [name], [place] [phone] 03/01/2013, 10:12 AM

## 2013-03-01 NOTE — Progress Notes (Signed)
Family Medicine Teaching Service Daily Progress Note Service Page: [phone]  Subjective:  Reports BM last night and no n/vomiting. Mentation wnl.  Medications reviewed: No PRN in 24 hours.  Objective: BP 128/58  Pulse 65  Temp(Src) 98.7 F (37.1 C) (Oral)  Resp 18  Ht 5\' 5"  (1.651 m)  Wt 173 lb (78.472 kg)  BMI 28.79 kg/m2  SpO2 96%   Exam: General: resting in chair by bed eating breakfast, NAD, talkative Cardiovascular: RRR. No murmurs, rubs, or gallops though distant. Respiratory: CTAB. No wheezing appreciated. No increased work of breathing.  Abdomen: obese, soft, nondistended  Extremities: Trace LE edema, tenderness to palpation at ankles and shin but not calves, no cord, no erythema or size difference, SCD not in place NEURO: Awake, alert, normal speech, no focal findings, hard of hearing SKIN: No rash or cyanosis  Intake/Output Summary (Last 24 hours) at 03/01/13 0905 Last data filed at 03/01/13 0531  Gross per 24 hour  Intake   1560 ml  Output      0 ml  Net   1560 ml    Labs/Imaging -  Urine culture 4/10 - negative    Recent Labs Lab 02/27/13 0720 02/28/13 0635 03/01/13 0655  HGB 8.6* 8.0* 8.1*  HCT 27.4* 25.6* 25.7*  WBC 10.4 8.1 7.0  PLT 225 227 243    Recent Labs Lab 02/25/13 0545 02/26/13 0440 02/27/13 0720 02/28/13 0635 03/01/13 0655  NA 132* 133* 130* 134* 129*  K 4.9 4.0 4.8 3.7 4.4  CL 97 96 94* 97 94*  CO2 28 28 29 30 27   GLUCOSE 123* 130* 141* 132* 125*  BUN 15 22 31* 16 24*  CREATININE 3.47* 4.53* 5.33* 3.47* 4.50*  CALCIUM 8.1* 8.7 9.2 8.4 8.5  PHOS 2.7 2.3 2.3 2.5 2.4   Urine culture pending  Echocardiogram 4/8: Normal systolic function (EF 75-64%), trivial mitral regurgitation, mild tricuspid regurgitation Gastric emptying study: Moderate slowing  4/10 IR fluoro - guided CV line RIGHT: IMPRESSION:  1. Technically successful placement of tunneled right IJ  hemodialysis catheter with ultrasound and fluoroscopic guidance.   Ready for routine use.  Assessment and Plan: [name] [name] is a 61 y.o. female presenting with PMH of DM-2, muscle spasms, and emphysematous pylenephritis s/p right nephrectomy who presented with nausea/vomiting for approximately 2 weeks. On admission found to have acute kidney injury with associated metabolic acidosis and hyperkalemia, as well as intractable n/v possibly secondary to azotemia.   # AKI - Unclear etiology - Prerenal ATN suspected though prolonged course, other etiology with solitary kidney?. Cr ~8 on admission, had previously had baseline <1, currently around 4-5. BP has been tenuous. Not on antihypertensive. Per renal US and GU, no signs of obstruction. Not improved with IV fluids or with continued HD. Do not want to biopsy as this is pt's solitary kidney. - Trial a few days without HD - E coli UTI likely contributed but now is s/p treatment of CTX x 10 days.  - Renal following and we greatly appreciate further recommendations. Per renal recs.    - 4/5, 4/6, 4/8, 4/10, 4/14    - ANCA, anti GBM, ANA  Negative, urine eosinophils pending  - Continue calcium carbonate for low calcium and calcitriol - Remain in house while waiting improvement vs 6 weeks   # Anemia - Hgb stable at ~8. Iron panel with sat 23%, ferritin 495, folate 8.4; IV Fe per renal, FOBT neg x 2  - IV iron, aranesp - Will monitor  closely during admission. - Transfuse for Hgb <7  # Hyperkalemia with mild T wave peaking on EKG- s/p kayexalate with resultant hypokalemia, dosed IV K x 1, resolved; normal Mg.  # Itching - Severe itching, prn atarax - Discontinue when possible  # Atrial fibrillation with RVR - Brief episode 4/8 with vomiting, started diltiazem drip and pt came out of episode. With borderline low blood pressures, stopped diltiazem drip and pt stable with no further episodes.  # Metabolic acidosis - Thought to be likely from diarrhea. Corrected after IV fluids with bicarbonate   # UTI  - Urine Cx  resulted >100,000 cfu e coli, pan-sensitive; 2nd culture 4/5 again with 5000 CFU e coli - Rcd CTX 10 days course due to h/o emphysematous pyelo - Neg 4/10 culture   # Nausea/vomiting/diarrhea - Improved today; likely from azotemia and gastroparesis - GI consulted and found normal EGD and gastric emptying study with moderate slowing - H pylori IgG positive - will hold off on treatment as no EGD findings and do not want to muddy the waters with potential for c diff infection or GI symptoms with antibiotics if not needed - Zofran PRN, phenergan prn - Protonix daily - Reglan plan to taper off (down to once daily, eventual d/c) and GI signed off 4/11; greatly appreciate their input   # DM-2 - A1c 6.1; - CBG's TID AC and QHS  - Sensitive SSI   # Muscle spasms of back  - Tylenol PRN; will refrain from flexeril due to anticholinergic effects   # Hypertension - Improved though continued large variation SBP 90s-130s. - Renal disease - Continue to monitor  # Diverticulosis with no evidence of diverticulitis on CT  # CHD risk with DM Type II and HTN - continue low-dose aspirin  # Hard of hearing - Pt would benefit from hearing aid outpatient  # FEN/GI: SLIV; diet carb modified with low sodium - GI: Increased to 34g miralax BID daily  # Prophylaxis:  - IS to bedside, encourage OOB with asst - started SQ heparin 4/13  # Disposition: Pending clinical improvement in AKI - PT- home health PT with rolling walker and 5" wheels; OT - 3-in-1 bedside commode.   - Consulted CM in hopes of earlier discharge with outpatient HD but this is not possible. - Will be important to f/u closely with PCP  # Code Status: Full code   Conni Slipper, MD 03/01/2013, 9:05 AM

## 2013-03-01 NOTE — Progress Notes (Signed)
ASSESSMENT/RECOMMENDATIONS  Pt is a 62 y.o. yo female with a PMHX of DM II, with prior history emphysematous pyelonephritis-- s/p right nephrectomy in 2012, admitted to [facility] on 02/13/2013 with nausea, vomiting, diarrhea for 2 weeks resulting in AKI with associated metabolic acidosis and hyperkalemia. Initially felt d/t hypovolemia with BUN 101, Cr 8.42. (Baseline Cr from 03/20/11-09/14/11 trended from 0.51-0.93, but no labs in interim), and did not respond to IV fluid hydration, requiring initiation of HD 02/17/13 for volume and azotemia   1. Acute Renal failure (presume AKI) in patient with solitary kidney, rising creat   No urgent indication for HD.  Will follow.  Subjective: Interval History: Will work with PT  Objective: Vital signs in last 24 hours: Temp:  [98 F (36.7 C)-98.7 F (37.1 C)] 98.7 F (37.1 C) (04/16 0530) Pulse Rate:  [64-75] 65 (04/16 0530) Resp:  [18] 18 (04/16 0530) BP: (96-137)/(45-67) 128/58 mmHg (04/16 0530) SpO2:  [96 %-99 %] 96 % (04/16 0530) Weight:  [78.472 kg (173 lb)] 78.472 kg (173 lb) (04/15 2111) Weight change: -1.028 kg (-2 lb 4.3 oz)  Intake/Output from previous day: 04/15 0701 - 04/16 0700 In: 1560 [P.O.:1560] Out: -  Intake/Output this shift:    General appearance: alert and cooperative Chest wall: no tenderness Cardio: regular rate and rhythm, S1, S2 normal, no murmur, click, rub or gallop GI: soft, non-tender; bowel sounds normal; no masses,  no organomegaly Extremities: edema 1-2+  Lab Results:  Recent Labs  02/28/13 0635 03/01/13 0655  WBC 8.1 7.0  HGB 8.0* 8.1*  HCT 25.6* 25.7*  PLT 227 243   BMET:  Recent Labs  02/28/13 0635 03/01/13 0655  NA 134* 129*  K 3.7 4.4  CL 97 94*  CO2 30 27  GLUCOSE 132* 125*  BUN 16 24*  CREATININE 3.47* 4.50*  CALCIUM 8.4 8.5   No results found for this basename: PTH,  in the last 72 hours Iron Studies: No results found for this basename: IRON, TIBC, TRANSFERRIN, FERRITIN,  in the last 72  hours Studies/Results: No results found.  Scheduled: . antiseptic oral rinse  15 mL Mouth Rinse BID  . aspirin EC  81 mg Oral Daily  . calcitRIOL  0.5 mcg Oral Daily  . calcium carbonate  1 tablet Oral TID WC  . darbepoetin (ARANESP) injection - NON-DIALYSIS  100 mcg Subcutaneous Q Wed-1800  . heparin subcutaneous  5,000 Units Subcutaneous Q8H  . heparin lock flush  2,800 Units Intravenous Once  . insulin aspart  0-9 Units Subcutaneous TID WC  . metoCLOPramide  5 mg Oral QAC breakfast  . pantoprazole  40 mg Oral Daily  . polyethylene glycol  34 g Oral BID  . sodium chloride  3 mL Intravenous Q12H     LOS: 16 days   Yazaira Speas C 03/01/2013,9:22 AM

## 2013-03-02 LAB — GLUCOSE, CAPILLARY: Glucose-Capillary: 143 mg/dL — ABNORMAL HIGH (ref 70–99)

## 2013-03-02 LAB — RENAL FUNCTION PANEL
Albumin: 2.5 g/dL — ABNORMAL LOW (ref 3.5–5.2)
Chloride: 91 mEq/L — ABNORMAL LOW (ref 96–112)
Creatinine, Ser: 5.32 mg/dL — ABNORMAL HIGH (ref 0.50–1.10)
GFR calc non Af Amer: 8 mL/min — ABNORMAL LOW (ref >90)
Phosphorus: 3.2 mg/dL (ref 2.3–4.6)
Potassium: 4.6 mEq/L (ref 3.5–5.1)

## 2013-03-02 LAB — CBC
Platelets: 286 10*3/uL (ref 150–400)
RDW: 15 % (ref 11.5–15.5)
WBC: 7.4 10*3/uL (ref 4.0–10.5)

## 2013-03-02 MED ORDER — POLYETHYLENE GLYCOL 3350 17 G PO PACK
17.0000 g | PACK | Freq: Every day | ORAL | Status: DC | PRN
  Filled 2013-03-02: qty 1

## 2013-03-02 NOTE — Plan of Care (Signed)
Problem: Food- and Nutrition-Related Knowledge Deficit (NB-1.1) Goal: Nutrition education Formal process to instruct or train a patient/client in a skill or to impart knowledge to help patients/clients voluntarily manage or modify food choices and eating behavior to maintain or improve health. Outcome: Completed/Met Date Met:  03/02/13  RD consulted for nutrition education regarding diabetes.     Lab Results  Component Value Date    HGBA1C 6.1* 02/14/2013    RD provided "Carbohydrate Counting for People with Diabetes" handout from the Academy of [employer]. Discussed different food groups and their effects on blood sugar, emphasizing carbohydrate-containing foods. Provided list of carbohydrates and recommended serving sizes of common foods.  Discussed importance of controlled and consistent carbohydrate intake throughout the day. Provided examples of ways to balance meals/snacks and encouraged intake of high-fiber, whole grain complex carbohydrates. Teach back method used.  Expect fair compliance.  Body mass index is 28.79 kg/(m^2). Pt meets criteria for overweight based on current BMI.  Current diet order is Carbohydrate Modified Medium, patient is consuming approximately 100% of meals at this time. Labs and medications reviewed. No further nutrition interventions warranted at this time. RD contact information provided. If additional nutrition issues arise, please re-consult RD.  Jarold Motto MS, RD, LDN Pager: [phone] After-hours pager: [phone]

## 2013-03-02 NOTE — Progress Notes (Signed)
Physical Therapy Treatment Patient Details Name: [name] [name] MRN: [id number] DOB: [birth year] Today's Date: 03/02/2013 Time: 7829-5621 PT Time Calculation (min): 35 min  PT Assessment / Plan / Recommendation Comments on Treatment Session  Pt movtivated to walk, but had sudden episode of dizziness that caused her to stop and sit down for several minutes to prevent falling. Nursing aware    Follow Up Recommendations  Home health PT     Does the patient have the potential to tolerate intense rehabilitation     Barriers to Discharge        Equipment Recommendations       Recommendations for Other Services    Frequency Min 3X/week   Plan Discharge plan remains appropriate    Precautions / Restrictions Precautions Precautions: Fall Restrictions Weight Bearing Restrictions: No   Pertinent Vitals/Pain Significant transient dizziness episode during walking    Mobility  Bed Mobility Details for Bed Mobility Assistance: Pt in recliner at beginning of session Transfers Transfers: Sit to Stand;Stand to Sit Sit to Stand: 6: Modified independent (Device/Increase time);Not tested (comment);From chair/3-in-1 Stand to Sit: 6: Modified independent (Device/Increase time) Details for Transfer Assistance: pt having trouble with control of descent today even with multiple verbal cues to slow down and use legs to contol Ambulation/Gait Ambulation/Gait Assistance: 5: Supervision Ambulation Distance (Feet): 500 Feet (250. dizziness, recovered with sitting rest break, 250) Assistive device: Rolling walker Ambulation/Gait Assistance Details: had to have chair brought up due to dizziness.  Need to sit and rest about 5 minutes before she could recover and walk back.  Then states, "I feel fine now"  BP upon return 135/67 Gait Pattern: Trunk flexed;Step-to pattern Gait velocity: pt needs consistent cues to  extend upper trunk and hips to improve posture. General Gait Details: Major episode of  dizziness during walk today.  Pt did not fall, but needed to sit in chair for several minutes prior to recovery. Stairs: No Wheelchair Mobility Wheelchair Mobility: No    Exercises Other Exercises Other Exercises: pt reports she is doing exercises on written program without difficulty   PT Diagnosis:    PT Problem List:   PT Treatment Interventions:     PT Goals Acute Rehab PT Goals PT Goal Formulation: With patient Time For Goal Achievement: 03/15/13 Potential to Achieve Goals: Good Pt will go Sit to Stand: Independently PT Goal: Sit to Stand - Progress: Progressing toward goal Pt will go Stand to Sit: Independently PT Goal: Stand to Sit - Progress: Progressing toward goal Pt will Ambulate: Independently;>150 feet PT Goal: Ambulate - Progress: Progressing toward goal  Visit Information  Last PT Received On: 03/02/13 Assistance Needed: +1    Subjective Data  Subjective: "I feel like the elevator just went down"  Pt dizzy while walking.  Had to have chair brought up Patient Stated Goal: To go home and be independent.   Cognition  Cognition Arousal/Alertness: Awake/alert Behavior During Therapy: WFL for tasks assessed/performed Overall Cognitive Status: Within Functional Limits for tasks assessed General Comments: pt HOH so have to speak loudly    Balance     End of Session PT - End of Session Equipment Utilized During Treatment: Gait belt Activity Tolerance: Other (comment) (significant episode of dizziness during walking) Patient left: in chair;with call bell/phone within reach;with nursing in room Nurse Communication: Mobility status   GP    Rosey Bath K. Manson Passey, [place] [phone] 03/02/2013, 9:17 AM

## 2013-03-02 NOTE — Progress Notes (Signed)
Family Medicine Teaching Service Daily Progress Note Service Page: [phone]  Subjective:  Episode of dizziness yesterday during PT, BM yesterday was loose.   Medications reviewed: No PRN in 24 hours.  Objective: BP 135/73  Pulse 66  Temp(Src) 98.6 F (37 C) (Oral)  Resp 18  Ht 5\' 5"  (1.651 m)  Wt 173 lb (78.472 kg)  BMI 28.79 kg/m2  SpO2 100%   Exam: General: resting in chair by bed eating breakfast, NAD, talkative Cardiovascular: RRR. No murmurs, rubs, or gallops though distant. Respiratory: CTAB. No wheezing appreciated. No increased work of breathing.  Abdomen: obese, soft, nondistended  Extremities: Trace LE edema, tenderness to palpation at ankles and shin but not calves, no cord, no erythema or size difference NEURO: Awake, alert, normal speech, no focal findings, hard of hearing SKIN: No rash or cyanosis  Intake/Output Summary (Last 24 hours) at 03/02/13 0729 Last data filed at 03/02/13 0600  Gross per 24 hour  Intake    240 ml  Output    300 ml  Net    -60 ml    Labs/Imaging -  Urine culture 4/10 - negative    Recent Labs Lab 02/27/13 0720 02/28/13 0635 03/01/13 0655  HGB 8.6* 8.0* 8.1*  HCT 27.4* 25.6* 25.7*  WBC 10.4 8.1 7.0  PLT 225 227 243    Recent Labs Lab 02/25/13 0545 02/26/13 0440 02/27/13 0720 02/28/13 0635 03/01/13 0655  NA 132* 133* 130* 134* 129*  K 4.9 4.0 4.8 3.7 4.4  CL 97 96 94* 97 94*  CO2 28 28 29 30 27   GLUCOSE 123* 130* 141* 132* 125*  BUN 15 22 31* 16 24*  CREATININE 3.47* 4.53* 5.33* 3.47* 4.50*  CALCIUM 8.1* 8.7 9.2 8.4 8.5  PHOS 2.7 2.3 2.3 2.5 2.4   Urine culture pending  Echocardiogram 4/8: Normal systolic function (EF 71-06%), trivial mitral regurgitation, mild tricuspid regurgitation Gastric emptying study: Moderate slowing  4/10 IR fluoro - guided CV line RIGHT: IMPRESSION:  1. Technically successful placement of tunneled right IJ  hemodialysis catheter with ultrasound and fluoroscopic guidance.  Ready  for routine use.  Assessment and Plan: [name] [name] is a 62 y.o. female presenting with PMH of DM-2, muscle spasms, and emphysematous pylenephritis s/p right nephrectomy who presented with nausea/vomiting for approximately 2 weeks. On admission found to have acute kidney injury with associated metabolic acidosis and hyperkalemia, as well as intractable n/v possibly secondary to azotemia.   # AKI - Unclear etiology - Prerenal ATN suspected though prolonged course, other etiology with solitary kidney?. Cr ~8 on admission, had previously had baseline <1, currently around 4-5. BP has been tenuous. Not on antihypertensive. Per renal US and GU, no signs of obstruction. Not improved with IV fluids or with continued HD. Do not want to biopsy as this is pt's solitary kidney. - Trial a few days without HD - E coli UTI likely contributed but now is s/p treatment of CTX x 10 days.  - Renal following and we greatly appreciate further recommendations. Per renal recs.    - 4/5, 4/6, 4/8, 4/10, 4/14    - ANCA, anti GBM, ANA  Negative, urine eosinophils pending  - Continue calcium carbonate for low calcium and calcitriol - Remain in house while waiting improvement vs 6 weeks   # Anemia - Hgb stable at ~8. Iron panel with sat 23%, ferritin 495, folate 8.4; IV Fe per renal, FOBT neg x 2  - IV iron, aranesp - Will monitor closely during  admission. - Transfuse for Hgb <7  # Hyperkalemia with mild T wave peaking on EKG- s/p kayexalate with resultant hypokalemia, dosed IV K x 1, resolved; normal Mg.  # Itching - Severe itching, prn atarax, seems resolved - Discontinue when possible  # Atrial fibrillation with RVR - Brief episode 4/8 with vomiting, started diltiazem drip and pt came out of episode. With borderline low blood pressures, stopped diltiazem drip and pt stable with no further episodes.  # Metabolic acidosis - Thought to be likely from diarrhea. Corrected after IV fluids with bicarbonate   # UTI  -  Urine Cx resulted >100,000 cfu e coli, pan-sensitive; 2nd culture 4/5 again with 5000 CFU e coli - Rcd CTX 10 days course due to h/o emphysematous pyelo - Neg 4/10 culture   # Nausea/vomiting/diarrhea - Improved today; likely from azotemia and gastroparesis - GI consulted and found normal EGD and gastric emptying study with moderate slowing - H pylori IgG positive - will hold off on treatment as no EGD findings and do not want to muddy the waters with potential for c diff infection or GI symptoms with antibiotics if not needed - Zofran PRN, phenergan prn - Protonix daily - Reglan plan to taper off (down to once daily, eventual d/c) and GI signed off 4/11; greatly appreciate their input   # DM-2 - A1c 6.1; - CBG's TID AC and QHS  - Sensitive SSI   # Muscle spasms of back  - Tylenol PRN; will refrain from flexeril due to anticholinergic effects   # Hypertension - Remain labile, with one episode dizzines yesterday - Renal disease - Continue to monitor - Out of bed with assistance   # Diverticulosis with no evidence of diverticulitis on CT  # CHD risk with DM Type II and HTN - continue low-dose aspirin  # Hard of hearing - Pt would benefit from hearing aid outpatient  # FEN/GI: SLIV; diet carb modified with low sodium - GI: Decrease miralax to prn  # Prophylaxis:  - IS to bedside, encourage OOB with asst - started SQ heparin 4/13  # Disposition: Pending clinical improvement in AKI - PT- home health PT with rolling walker and 5" wheels; OT - 3-in-1 bedside commode.   - Consulted CM in hopes of earlier discharge with outpatient HD but this is not possible. - Will be important to f/u closely with PCP  # Code Status: Full code   Conni Slipper, MD 03/02/2013, 7:29 AM

## 2013-03-02 NOTE — Progress Notes (Signed)
  Pt is a 63 y.o. yo female with a PMHX of DM II, with prior history emphysematous pyelonephritis-- s/p right nephrectomy in 2012, admitted to [facility] on 02/13/2013 with nausea, vomiting, diarrhea for 2 weeks resulting in AKI with associated metabolic acidosis and hyperkalemia. Initially felt d/t hypovolemia with BUN 101, Cr 8.42. (Baseline Cr from 03/20/11-09/14/11 trended from 0.51-0.93, but no labs in interim), and did not respond to IV fluid hydration, requiring initiation of HD 02/17/13 for volume and azotemia  ASSESSMENT/RECOMMENDATIONS  1. Acute Renal failure (presume AKI) in patient with solitary kidney, rising creat with hyponatremia and volume excess. Dialysis again today.  I am more inclined to think recovery is less likely. Subjective: Interval History: Diarrhea  Objective: Vital signs in last 24 hours: Temp:  [97.7 F (36.5 C)-98.6 F (37 C)] 98.6 F (37 C) (04/17 1045) Pulse Rate:  [62-69] 62 (04/17 1045) Resp:  [16-18] 17 (04/17 1045) BP: (108-154)/(46-73) 139/72 mmHg (04/17 1045) SpO2:  [96 %-100 %] 98 % (04/17 1045) Weight change:   Intake/Output from previous day: 04/16 0701 - 04/17 0700 In: 240 [P.O.:240] Out: 300 [Urine:300] Intake/Output this shift: Total I/O In: 360 [P.O.:360] Out: -   General appearance: alert and cooperative Chest wall: no tenderness Cardio: regular rate and rhythm, S1, S2 normal, no murmur, click, rub or gallop GI: soft, non-tender; bowel sounds normal; no masses,  no organomegaly Extremities: edema 2+  Lab Results:  Recent Labs  03/01/13 0655 03/02/13 0500  WBC 7.0 7.4  HGB 8.1* 8.1*  HCT 25.7* 25.4*  PLT 243 286   BMET:  Recent Labs  03/01/13 0655 03/02/13 0500  NA 129* 125*  K 4.4 4.6  CL 94* 91*  CO2 27 26  GLUCOSE 125* 124*  BUN 24* 30*  CREATININE 4.50* 5.32*  CALCIUM 8.5 8.7   No results found for this basename: PTH,  in the last 72 hours Iron Studies: No results found for this basename: IRON, TIBC, TRANSFERRIN,  FERRITIN,  in the last 72 hours Studies/Results: No results found.  Scheduled: . antiseptic oral rinse  15 mL Mouth Rinse BID  . aspirin EC  81 mg Oral Daily  . calcitRIOL  0.5 mcg Oral Daily  . calcium carbonate  1 tablet Oral TID WC  . darbepoetin (ARANESP) injection - NON-DIALYSIS  100 mcg Subcutaneous Q Wed-1800  . heparin subcutaneous  5,000 Units Subcutaneous Q8H  . heparin lock flush  2,800 Units Intravenous Once  . insulin aspart  0-9 Units Subcutaneous TID WC  . metoCLOPramide  5 mg Oral QAC breakfast  . pantoprazole  40 mg Oral Daily  . sodium chloride  3 mL Intravenous Q12H     LOS: 17 days   Armani Brar C 03/02/2013,12:21 PM

## 2013-03-03 DIAGNOSIS — N19 Unspecified kidney failure: Secondary | ICD-10-CM

## 2013-03-03 DIAGNOSIS — N186 End stage renal disease: Secondary | ICD-10-CM

## 2013-03-03 LAB — GLUCOSE, CAPILLARY
Glucose-Capillary: 124 mg/dL — ABNORMAL HIGH (ref 70–99)
Glucose-Capillary: 143 mg/dL — ABNORMAL HIGH (ref 70–99)
Glucose-Capillary: 146 mg/dL — ABNORMAL HIGH (ref 70–99)
Glucose-Capillary: 154 mg/dL — ABNORMAL HIGH (ref 70–99)

## 2013-03-03 NOTE — Progress Notes (Signed)
FMTS Attending Daily Note: Averiana Clouatre MD [phone] pager office [phone] I have discussed this patient with the resident and reviewed the assessment and plan as documented above. I agree wit the resident's findings and plan.  

## 2013-03-03 NOTE — Progress Notes (Signed)
Chaplain Note:  Responded to request to help pt facing Major Life Transition. Found pt sitting in recliner by the window.  Pt states she has just been told she will have to be on dialysis permanently and she is struggling with that.  Pt also struggling with issues related to finances and not being able to clean her house. I provided emotional support and active and reflective listening. Prayed with pt and will follow up as needed.  Rutherford Nail Chaplain Resident.

## 2013-03-03 NOTE — Progress Notes (Signed)
ASSESSMENT/RECOMMENDATIONS  Renal failure  in patient with solitary kidney, rising creat with hyponatremia and volume excess. Limited medical f/u in the past. I am more inclined to think recovery is less likely.  Will plan for outpatient dialysis and access placement.  Next dialysis in AM  Subjective: Interval History: HD yesterday well tolerated  Objective: Vital signs in last 24 hours: Temp:  [97.6 F (36.4 C)-98.7 F (37.1 C)] 98 F (36.7 C) (04/18 1000) Pulse Rate:  [56-66] 65 (04/18 1000) Resp:  [14-18] 16 (04/18 1000) BP: (76-172)/(39-92) 158/69 mmHg (04/18 1000) SpO2:  [94 %-100 %] 98 % (04/18 1000) Weight:  [81.1 kg (178 lb 12.7 oz)-83.3 kg (183 lb 10.3 oz)] 81.1 kg (178 lb 12.7 oz) (04/18 0241) Weight change:   Intake/Output from previous day: 04/17 0701 - 04/18 0700 In: 1200 [P.O.:1200] Out: 2500 [Urine:500] Intake/Output this shift: Total I/O In: 240 [P.O.:240] Out: -   General appearance: alert and cooperative Resp: diminished breath sounds bibasilar Cardio: regular rate and rhythm, S1, S2 normal, no murmur, click, rub or gallop Extremities: edema 2+  Lab Results:  Recent Labs  03/01/13 0655 03/02/13 0500  WBC 7.0 7.4  HGB 8.1* 8.1*  HCT 25.7* 25.4*  PLT 243 286   BMET:  Recent Labs  03/01/13 0655 03/02/13 0500  NA 129* 125*  K 4.4 4.6  CL 94* 91*  CO2 27 26  GLUCOSE 125* 124*  BUN 24* 30*  CREATININE 4.50* 5.32*  CALCIUM 8.5 8.7   No results found for this basename: PTH,  in the last 72 hours Iron Studies: No results found for this basename: IRON, TIBC, TRANSFERRIN, FERRITIN,  in the last 72 hours Studies/Results: No results found.  Scheduled: . antiseptic oral rinse  15 mL Mouth Rinse BID  . aspirin EC  81 mg Oral Daily  . calcitRIOL  0.5 mcg Oral Daily  . calcium carbonate  1 tablet Oral TID WC  . darbepoetin (ARANESP) injection - NON-DIALYSIS  100 mcg Subcutaneous Q Wed-1800  . heparin subcutaneous  5,000 Units Subcutaneous Q8H  .  heparin lock flush  2,800 Units Intravenous Once  . insulin aspart  0-9 Units Subcutaneous TID WC  . metoCLOPramide  5 mg Oral QAC breakfast  . pantoprazole  40 mg Oral Daily  . sodium chloride  3 mL Intravenous Q12H     LOS: 18 days   Jernard Reiber C 03/03/2013,10:41 AM

## 2013-03-03 NOTE — Consult Note (Signed)
History and exam details as above.  She is right handed.  Has good arterial inflow both arms.  Will await vein map results to decide on arm and fistula vs graft.  Fabienne Bruns, MD [facility] [place] Office: [phone] Pager: [phone]

## 2013-03-03 NOTE — Progress Notes (Signed)
FMTS Attending Daily Note: Syerra Abdelrahman MD [phone] pager office [phone] I have discussed this patient with the resident and reviewed the assessment and plan as documented above. I agree wit the resident's findings and plan.  

## 2013-03-03 NOTE — Progress Notes (Signed)
Utilization review completed.  

## 2013-03-03 NOTE — Progress Notes (Signed)
Right  Upper Extremity Vein Map    Cephalic  Segment Diameter Depth Comment  1. Axilla 3mm  Branch  2. Mid upper arm 2.39mm    3. Above [facility] 2.48mm  Branch  4. In [facility] 2.20mm    5. Below AC 1.59mm    6. Mid forearm 1.61mm    7. Wrist   Unable to visualize due to IV placement.                  Basilic  Segment Diameter Depth Comment  1. Axilla 7.70mm 25mm   2. Mid upper arm 4.71mm 24.82mm   3. Above [facility] 5.52mm 18.66mm   4. In [facility] 4.43mm 12.39mm Branch  5. Below AC 4.75mm 6.85mm   6. Mid forearm 3.66mm 5.55mm   7. Wrist   Unable to visualize due to IV placement                   Left Upper Extremity Vein Map    Cephalic  Segment Diameter Depth Comment  1. Axilla 3.52mm    2. Mid upper arm 3.80mm    3. Above AC 2.37mm    4. In [facility] 3.13mm  Branch  5. Below AC 2.23mm    6. Mid forearm 2.67mm    7. Wrist 1.14mm     Basilic  Segment Diameter Depth Comment  1. Axilla 8.39mm 20.26mm Branch  2. Mid upper arm 4.55mm 22.37mm   3. Above AC 5.67mm 24.59mm   4. In [facility] 4.73mm 18.32mm   5. Below AC 5.39mm 5.35mm   6. Mid forearm 3.36mm 4.26mm   7. Wrist 2.63mm 2.28mm                      03/03/2013 3:39 PM Gertie Fey, RDMS, RDCS   sas

## 2013-03-03 NOTE — Progress Notes (Signed)
FMTS Attending Daily Note: [name] Minnie MD [phone] pager office [phone] I have discussed this patient with the resident and reviewed the assessment and plan as documented above. I agree wit the resident's findings and plan.  

## 2013-03-03 NOTE — Consult Note (Signed)
[facility] [place] CONSULT NOTE 03/03/2013 DOB: [id number] MRN : [id number]  BJ:[id number] Referring Physician:A. Lowell Guitar, MD  History of Present Illness: [name] [name] is a 62 y.o. female with PMHx DM and right nephrectomy for emphysematous pyelonephritis in 2012. She was admitted to [facility] for intractable  Nausea/vomiting developed AKI. CR is 5.32. We were asked to evaluate for placement HD access.    Past Medical History  Diagnosis Date  . Hearing difficulty     Mild   . Diabetes mellitus without complication   . Night muscle spasms   . Bursitis   . Fatigue     Past Surgical History  Procedure Laterality Date  . 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 Esophagogastroduodenoscopy Left 02/20/2013    Procedure: ESOPHAGOGASTRODUODENOSCOPY (EGD);  Surgeon: Florencia Reasons, MD;  Location: [facility] ENDOSCOPY;  Service: Endoscopy;  Laterality: Left;     ROS: [x]  Positive  [ ]  Denies    General: [ ]  Weight loss, [ ]  Fever, [ ]  chills Neurologic: [ ]  Dizziness, [ ]  Blackouts, [ ]  Seizure [ ]  Stroke, [ ]  "Mini stroke", [ ]  Slurred speech, [ ]  Temporary blindness; [ ]  weakness in arms or legs, [ ]  Hoarseness Cardiac: [ ]  Chest pain/pressure, [ ]  Shortness of breath at rest [ ]  Shortness of breath with exertion, [ ]  Atrial fibrillation or irregular heartbeat Vascular: [ ]  Pain in legs with walking, [ ]  Pain in legs at rest, [ ]  Pain in legs at night,  [ ]  Non-healing ulcer, [ ]  Blood clot in vein/DVT,   Pulmonary: [ ]  Home oxygen, [ ]  Productive cough, [ ]  Coughing up blood, [ ]  Asthma,  [ ]  Wheezing Musculoskeletal:  [ ]  Arthritis, [ ]  Low back pain, [ ]  Joint pain Hematologic: [ ]  Easy Bruising, [ ]  Anemia; [ ]  Hepatitis Gastrointestinal: [ ]  Blood in stool, [ ]  Gastroesophageal Reflux/heartburn, [ ]  Trouble swallowing Urinary: [ ]  chronic Kidney disease, [ ]  on HD - [ ]  MWF or [ ]  TTHS, [ ]  Burning with urination, [ ]  Difficulty urinating Skin: [ ]   Rashes, [ ]  Wounds Psychological: [ ]  Anxiety, [ ]  Depression  Social History History  Substance Use Topics  . Smoking status: Never Smoker   . Smokeless tobacco: Not on file  . Alcohol Use: No    Family History Family History  Problem Relation Age of Onset  . Heart disease    . Diabetes    . Stroke    . Bone cancer Father     Of jaws   . Throat cancer Father     Allergies  Allergen Reactions  . Aleve (Naproxen Sodium)     Due to kidneys.  . Motrin (Ibuprofen)     Due to kidneys.    Current Facility-Administered Medications  Medication Dose Route Frequency Provider Last Rate Last Dose  . 0.9 %  sodium chloride infusion  100 mL Intravenous PRN Zada Girt, MD      . 0.9 %  sodium chloride infusion  100 mL Intravenous PRN Zada Girt, MD      . acetaminophen (TYLENOL) tablet 650 mg  650 mg Oral Q6H PRN Tommie Sams, DO   650 mg at 02/27/13 1937   Or  . acetaminophen (TYLENOL) suppository 650 mg  650 mg Rectal Q6H PRN Tommie Sams, DO      . antiseptic oral rinse (BIOTENE) solution 15 mL  15  mL Mouth Rinse BID Tobey Grim, MD   15 mL at 03/03/13 0800  . aspirin EC tablet 81 mg  81 mg Oral Daily Tommie Sams, DO   81 mg at 03/03/13 1041  . calcitRIOL (ROCALTROL) capsule 0.5 mcg  0.5 mcg Oral Daily Sadie Haber, MD   0.5 mcg at 03/03/13 1041  . calcium carbonate (OS-CAL - dosed in mg of elemental calcium) tablet 500 mg of elemental calcium  1 tablet Oral TID WC Lonia Skinner, MD   500 mg of elemental calcium at 03/03/13 0843  . darbepoetin (ARANESP) injection 100 mcg  100 mcg Subcutaneous Q Wed-1800 Zada Girt, MD   100 mcg at 02/22/13 1945  . guaiFENesin-dextromethorphan (ROBITUSSIN DM) 100-10 MG/5ML syrup 5 mL  5 mL Oral Q4H PRN Sanjuana Letters, MD      . heparin injection 1,000 Units  1,000 Units Dialysis PRN Zada Girt, MD      . heparin injection 1,700 Units  20 Units/kg Dialysis PRN Zada Girt, MD      . heparin injection 5,000 Units  5,000  Units Subcutaneous Q8H Leona Singleton, MD   5,000 Units at 03/03/13 1610  . heparin lock flush 100 unit/mL  2,800 Units Intravenous Once Zada Girt, MD      . hydrOXYzine (ATARAX/VISTARIL) tablet 25 mg  25 mg Oral QID PRN Lonia Skinner, MD   25 mg at 02/26/13 0910  . insulin aspart (novoLOG) injection 0-9 Units  0-9 Units Subcutaneous TID WC Tommie Sams, DO   1 Units at 03/03/13 [phone]  . lidocaine (PF) (XYLOCAINE) 1 % injection 5 mL  5 mL Intradermal PRN Zada Girt, MD      . metoCLOPramide (REGLAN) tablet 5 mg  5 mg Oral QAC breakfast Leona Singleton, MD   5 mg at 03/03/13 0843  . ondansetron (ZOFRAN) tablet 4 mg  4 mg Oral Q6H PRN Tommie Sams, DO   4 mg at 02/19/13 0842   Or  . ondansetron (ZOFRAN) injection 4 mg  4 mg Intravenous Q6H PRN Tommie Sams, DO   4 mg at 02/18/13 1045  . pantoprazole (PROTONIX) EC tablet 40 mg  40 mg Oral Daily Lonia Skinner, MD   40 mg at 03/03/13 1041  . polyethylene glycol (MIRALAX / GLYCOLAX) packet 17 g  17 g Oral Daily PRN Leona Singleton, MD      . sodium chloride 0.9 % injection 3 mL  3 mL Intravenous Q12H Jayce G Cook, DO   3 mL at 03/03/13 1041  . Zinc Oxide (TRIPLE PASTE) 12.8 % ointment   Topical PRN Lonia Skinner, MD         Imaging: No results found.  Significant Diagnostic Studies: CBC Lab Results  Component Value Date   WBC 7.4 03/02/2013   HGB 8.1* 03/02/2013   HCT 25.4* 03/02/2013   MCV 92.0 03/02/2013   PLT 286 03/02/2013    BMET    Component Value Date/Time   NA 125* 03/02/2013 0500   K 4.6 03/02/2013 0500   CL 91* 03/02/2013 0500   CO2 26 03/02/2013 0500   GLUCOSE 124* 03/02/2013 0500   BUN 30* 03/02/2013 0500   CREATININE 5.32* 03/02/2013 0500   CREATININE 0.93 09/14/2011 1615   CALCIUM 8.7 03/02/2013 0500   GFRNONAA 8* 03/02/2013 0500   GFRAA 9* 03/02/2013 0500    COAG Lab Results  Component Value Date  INR 1.21 02/13/2013   INR 1.20 03/10/2011   No results found for this basename: PTT      Physical Examination BP Readings from Last 3 Encounters:  03/03/13 158/69  03/03/13 158/69  10/12/11 110/73   Temp Readings from Last 3 Encounters:  03/03/13 98 F (36.7 C) Oral  03/03/13 98 F (36.7 C) Oral  10/12/11 97.9 F (36.6 C) Oral   SpO2 Readings from Last 3 Encounters:  03/03/13 98%  03/03/13 98%   Pulse Readings from Last 3 Encounters:  03/03/13 65  03/03/13 65  10/12/11 85    General:  WDWN in NAD Gait: Normal HENT: WNL Eyes: Pupils equal Pulmonary: normal non-labored breathing  Cardiac: RRR,  Abdomen: soft, NT, Skin: no rashes, ulcers noted Vascular Exam/Pulses: 2+ radial and ulnar pulses bilaterally Extremities without ischemic changes, no Gangrene , no cellulitis; no open wounds;  Musculoskeletal: no muscle wasting or atrophy  Neurologic: A&O X 3; Appropriate Affect ;  SENSATION: normal; MOTOR FUNCTION: Pt has good and equal strength in all extremities - 5/5 Speech is fluent/normal  Non-Invasive Vascular Imaging: vein mapping pending  ASSESSMENT/PLAN: XOLANI DEGRACIA is a 62 y.o. female With AKI in setting of single kidney. Pt CR now 5.33. Pt may need HD soon and we will get vein mapping and plan AVF/AVGG. This may be done as an outpt if pt discharged

## 2013-03-03 NOTE — Progress Notes (Signed)
Family Medicine Teaching Service Daily Progress Note Service Page: [phone]  Subjective:  [name] [name] had Hemodialysis overnight for electrolyte abnormalities. She reports accidental bowel movement yesterday but no nausea/vomiting. Wonders if for anxiety she can try [employer] which she has taken in the past.  Medications reviewed: No PRN in 24 hours.  Objective: BP 144/57  Pulse 62  Temp(Src) 98.7 F (37.1 C) (Oral)  Resp 14  Ht 5\' 5"  (1.651 m)  Wt 178 lb 12.7 oz (81.1 kg)  BMI 29.75 kg/m2  SpO2 96%   Exam:  General: resting in chair by bed eating breakfast, NAD, talkative Cardiovascular: RRR. No murmurs, rubs, or gallops though distant. Respiratory: CTAB. No wheezing appreciated. No increased work of breathing.  Abdomen: obese, soft, nondistended  Extremities: No LE edema; tenderness to palpation at ankles and shin but not calves, no cord, no erythema or size difference NEURO: Awake, alert, normal speech, no focal findings, hard of hearing SKIN: No rash or cyanosis  Intake/Output Summary (Last 24 hours) at 03/03/13 0832 Last data filed at 03/03/13 0541  Gross per 24 hour  Intake   1200 ml  Output   2500 ml  Net  -1300 ml    Labs/Imaging -  No new labs this AM.  Recent Labs Lab 02/28/13 0635 03/01/13 0655 03/02/13 0500  HGB 8.0* 8.1* 8.1*  HCT 25.6* 25.7* 25.4*  WBC 8.1 7.0 7.4  PLT 227 243 286    Recent Labs Lab 02/26/13 0440 02/27/13 0720 02/28/13 0635 03/01/13 0655 03/02/13 0500  NA 133* 130* 134* 129* 125*  K 4.0 4.8 3.7 4.4 4.6  CL 96 94* 97 94* 91*  CO2 28 29 30 27 26   GLUCOSE 130* 141* 132* 125* 124*  BUN 22 31* 16 24* 30*  CREATININE 4.53* 5.33* 3.47* 4.50* 5.32*  CALCIUM 8.7 9.2 8.4 8.5 8.7  PHOS 2.3 2.3 2.5 2.4 3.2    Assessment and Plan: VERNESHA TALBOT is a 62 y.o. female with PMH of DM-2, muscle spasms, and emphysematous pylenephritis s/p right nephrectomy who presented with nausea/vomiting for approximately 2 weeks and acute kidney  injury with associated metabolic acidosis and hyperkalemia. Nausea/vomiting have resolved but kidney injury remains despite adequate hydration.  # AKI - Unclear etiology - Prerenal ATN suspected though prolonged course, other etiology with solitary kidney?. Cr ~8 on admission from baseline <1, currently around 4-5. BP has been tenuous. Not on antihypertensive. Per renal US and GU, no signs of obstruction. Not improved with IV fluids or with continued HD. Do not want to biopsy as this is pt's solitary kidney. - HD yesterday with rising cr and hyponatremia. Recovery less likely per renal - E coli UTI s/p treatment of CTX x 10 days., neg 4/10 culture - Renal following and we greatly appreciate further recommendations. Per renal recs.    - 4/5, 4/6, 4/8, 4/10, 4/14    - ANCA, anti GBM, ANA  Negative, urine eosinophils pending  - Continue calcium carbonate for low calcium and calcitriol - Remain in house while waiting improvement vs 6 weeks   # Anemia - Hgb stable at ~8. Iron panel with sat 23%, ferritin 495, folate 8.4; IV Fe per renal, FOBT neg x 2  - IV iron, aranesp that renal is managing - Will monitor closely during admission. - Transfuse for Hgb <7  # Hyperkalemia with mild T wave peaking on EKG- s/p kayexalate with normal Mg. Resolved.  # Itching - Severe itching, prn atarax, seems resolved - Discontinue  when possible  # Atrial fibrillation with RVR - Brief episode 4/8 with vomiting, started diltiazem drip and pt came out of episode. With borderline low blood pressures, stopped diltiazem drip and pt stable with no further episodes.  # Metabolic acidosis - Thought to be likely from diarrhea. Corrected after IV fluids with bicarbonate   # Nausea/vomiting/diarrhea - Improved with HD and reglan; likely from azotemia and gastroparesis - GI consulted and found normal EGD and gastric emptying study with moderate slowing - H pylori IgG positive - will hold off on treatment as no EGD findings  and do not want to muddy the waters with potential for c diff infection or GI symptoms with antibiotics if not needed - Zofran PRN, phenergan prn - Protonix daily - Reglan tapered from TID to daily, eventual d/c. GI signed off 4/11  # DM-2 - A1c 6.1 - CBG's TID AC and QHS  - Sensitive SSI  - RD discussed diet with pt  # Muscle spasms of back  - Tylenol PRN; will refrain from flexeril due to anticholinergic effects   # Hypertension - Remain labile - Renal disease - Continue to monitor - Out of bed with assistance   # Diverticulosis with no evidence of diverticulitis on CT  # CHD risk with DM Type II and HTN - continue low-dose aspirin  # Hard of hearing - Pt would benefit from hearing aid outpatient  # FEN/GI: SLIV; diet carb modified with low sodium - GI: d/c miralax with loose stools - c difficile pcr tomorrow if continued loose stools  # Prophylaxis:  - IS to bedside, encourage OOB with asst - started SQ heparin 4/13  # Disposition: Pending clinical improvement in AKI - PT- home health PT with rolling walker and 5" wheels; OT - 3-in-1 bedside commode.   - Consulted CM in hopes of earlier discharge with outpatient HD but this is not possible. - Renal feels pt will need long-term HD. Will re-consult CM and SW to discuss with pt insurance and cost issues - Will be important to f/u closely with PCP  # Code Status: Full code   Conni Slipper, MD 03/03/2013, 8:32 AM

## 2013-03-04 LAB — RENAL FUNCTION PANEL
CO2: 28 mEq/L (ref 19–32)
Calcium: 8.6 mg/dL (ref 8.4–10.5)
Chloride: 94 mEq/L — ABNORMAL LOW (ref 96–112)
GFR calc Af Amer: 13 mL/min — ABNORMAL LOW (ref >90)
GFR calc non Af Amer: 11 mL/min — ABNORMAL LOW (ref >90)
Potassium: 4.2 mEq/L (ref 3.5–5.1)
Sodium: 130 mEq/L — ABNORMAL LOW (ref 135–145)

## 2013-03-04 LAB — CBC
MCH: 30.3 pg (ref 26.0–34.0)
Platelets: 278 10*3/uL (ref 150–400)
RBC: 2.61 MIL/uL — ABNORMAL LOW (ref 3.87–5.11)
WBC: 5.4 10*3/uL (ref 4.0–10.5)

## 2013-03-04 LAB — GLUCOSE, CAPILLARY: Glucose-Capillary: 138 mg/dL — ABNORMAL HIGH (ref 70–99)

## 2013-03-04 MED ORDER — ALTEPLASE 2 MG IJ SOLR: 2.0000 mg | Freq: Once | INTRAMUSCULAR | Status: DC | PRN

## 2013-03-04 MED ORDER — LIDOCAINE HCL (PF) 1 % IJ SOLN: 5.0000 mL | INTRAMUSCULAR | Status: DC | PRN

## 2013-03-04 MED ORDER — SODIUM CHLORIDE 0.9 % IV SOLN: 100.0000 mL | INTRAVENOUS | Status: DC | PRN

## 2013-03-04 MED ORDER — LIDOCAINE-PRILOCAINE 2.5-2.5 % EX CREA: 1.0000 "application " | TOPICAL_CREAM | CUTANEOUS | Status: DC | PRN

## 2013-03-04 MED ORDER — RENA-VITE PO TABS
1.0000 | ORAL_TABLET | Freq: Every day | ORAL | Status: DC
  Administered 2013-03-04 – 2013-03-07 (×4): 1 via ORAL
  Filled 2013-03-04 (×5): qty 1

## 2013-03-04 MED ORDER — PENTAFLUOROPROP-TETRAFLUOROETH EX AERO: 1.0000 "application " | INHALATION_SPRAY | CUTANEOUS | Status: DC | PRN

## 2013-03-04 MED ORDER — NEPRO/CARBSTEADY PO LIQD: 237.0000 mL | ORAL | Status: DC | PRN

## 2013-03-04 MED ORDER — HEPARIN SODIUM (PORCINE) 1000 UNIT/ML DIALYSIS: 1000.0000 [IU] | INTRAMUSCULAR | Status: DC | PRN

## 2013-03-04 MED ORDER — HEPARIN SODIUM (PORCINE) 1000 UNIT/ML DIALYSIS: 20.0000 [IU]/kg | INTRAMUSCULAR | Status: DC | PRN

## 2013-03-04 NOTE — Progress Notes (Signed)
Family Medicine Teaching Service Daily Progress Note Service Page: [phone]  Subjective:  HD this AM. No complaints. Spoke with chaplain for "a long time" yesterday and appreciated this. No diarrhea yesterday.   Medications reviewed: No PRN in 24 hours.  Objective: BP 112/59  Pulse 63  Temp(Src) 97.8 F (36.6 C) (Oral)  Resp 18  Ht 5\' 5"  (1.651 m)  Wt 181 lb 14.1 oz (82.5 kg)  BMI 30.27 kg/m2  SpO2 94%   Exam:  General: NAD, lying in HD bed, talkative Cardiovascular: RRR. No murmurs, rubs, or gallops though distant. Respiratory: CTAB anteriorly. No wheezing appreciated. No increased work of breathing.  Abdomen: obese, soft, nondistended  Extremities: No LE edema; tenderness to palpation at ankles and shin but not calves (pt states this is chronic), no cord, no erythema or size difference NEURO: Awake, alert, normal speech, no focal findings, hard of hearing SKIN: No rash or cyanosis  Intake/Output Summary (Last 24 hours) at 03/04/13 0721 Last data filed at 03/04/13 0542  Gross per 24 hour  Intake    720 ml  Output    200 ml  Net    520 ml   Labs/Imaging -    Recent Labs Lab 03/01/13 0655 03/02/13 0500 03/04/13 0635  HGB 8.1* 8.1* 7.9*  HCT 25.7* 25.4* 24.4*  WBC 7.0 7.4 5.4  PLT 243 286 278    Recent Labs Lab 02/26/13 0440 02/27/13 0720 02/28/13 0635 03/01/13 0655 03/02/13 0500  NA 133* 130* 134* 129* 125*  K 4.0 4.8 3.7 4.4 4.6  CL 96 94* 97 94* 91*  CO2 28 29 30 27 26   GLUCOSE 130* 141* 132* 125* 124*  BUN 22 31* 16 24* 30*  CREATININE 4.53* 5.33* 3.47* 4.50* 5.32*  CALCIUM 8.7 9.2 8.4 8.5 8.7  PHOS 2.3 2.3 2.5 2.4 3.2   Upper extremity doppler - all appear compressible   Assessment and Plan: [name] [name] is a 62 y.o. female with PMH of DM-2, muscle spasms, and emphysematous pylenephritis s/p right nephrectomy who presented with nausea/vomiting for approximately 2 weeks and acute kidney injury with associated metabolic acidosis and  hyperkalemia. Nausea/vomiting have resolved but kidney injury remains despite adequate hydration.  # AKI - Unclear etiology - Prerenal ATN suspected though prolonged course, other etiology with solitary kidney?. Cr ~8 on admission from baseline <1, currently around 4-5. BP has been tenuous. Not on antihypertensive. Per renal US and GU, no signs of obstruction. Not improved with IV fluids or with continued HD. Do not want to biopsy as this is pt's solitary kidney. - HD 4/17 and again this AM. Recovery less likely per renal - E coli UTI s/p treatment of CTX x 10 days., neg 4/10 culture - Renal following and we greatly appreciate further recommendations. Per renal recs.    - 4/5, 4/6, 4/8, 4/10, 4/14, 4/18, 4/19    - ANCA, anti GBM, ANA  Negative, urine eosinophils pending  - Continue calcium carbonate for low calcium and calcitriol - Remain in house while waiting improvement vs 6 weeks  - Upper extremity doppler to eval placement of long-term HD access  # Anemia - Hgb stable at ~8. Iron panel with sat 23%, ferritin 495, folate 8.4; IV Fe per renal, FOBT neg x 2  - IV iron, aranesp that renal is managing - Will monitor closely during admission. - Transfuse for Hgb <7  # Hyperkalemia with mild T wave peaking on EKG- s/p kayexalate with normal Mg. Resolved.  # Itching -  Severe itching, prn atarax, seems resolved - Discontinue when possible  # Atrial fibrillation with RVR - Brief episode 4/8 with vomiting, started diltiazem drip and pt came out of episode. With borderline low blood pressures, stopped diltiazem drip and pt stable with no further episodes.  # Metabolic acidosis - Thought to be likely from diarrhea. Corrected after IV fluids with bicarbonate   # Nausea/vomiting/diarrhea - Improved with HD and reglan; likely from azotemia and gastroparesis - GI consulted and found normal EGD and gastric emptying study with moderate slowing - H pylori IgG positive - will hold off on treatment as no  EGD findings and do not want to muddy the waters with potential for c diff infection or GI symptoms with antibiotics if not needed - Zofran PRN, phenergan prn - Protonix daily - Reglan tapered from TID to daily, eventual d/c. GI signed off 4/11 - Diarrhea yesterday, likely from miralax. D/c'ed yesterday, no diarrhea since - if another episode, test for c difficile  # DM-2 - A1c 6.1 - CBG's TID AC and QHS  - Sensitive SSI  - RD discussed diet with pt  # Muscle spasms of back  - Tylenol PRN; will refrain from flexeril due to anticholinergic effects   # Hypertension - Improved over 24 hours, continue to monitor - Renal disease - Continue to monitor - Out of bed with assistance   # Diverticulosis with no evidence of diverticulitis on CT  # CHD risk with DM Type II and HTN - continue low-dose aspirin  # Hard of hearing - Pt would benefit from hearing aid outpatient  # Psych - Pt is facing a major life transition with renal injury that is seeming more permanent to require long term hemodialysis. When I asked if she would like to start any medication or speak with spiritual counselor, she opted to speak with spiritual counselor and stated [employer] has worked for her in the past. - Will find out if this is possible to start here - Spiritual services saw pt yesterday. Will f/u as needed  # FEN/GI: SLIV; diet carb modified with low sodium - GI: d/c'ed miralax with loose stools - c difficile pcr if continued loose stools  # Prophylaxis:  - IS to bedside, encourage OOB with asst - started SQ heparin 4/13  # Disposition: Pending clinical improvement in AKI - PT- home health PT with rolling walker and 5" wheels; OT - 3-in-1 bedside commode.   - Consulted CM in hopes of earlier discharge with outpatient HD but this is not possible. - Renal feels pt will need long-term HD. Will re-consult CM and SW to discuss with pt insurance and cost issues - Will be important to f/u closely with  PCP  # Code Status: Full code   Conni Slipper, MD 03/04/2013, 7:21 AM

## 2013-03-04 NOTE — Progress Notes (Signed)
ASSESSMENT/RECOMMENDATIONS  Renal failure in patient with solitary kidney, rising creat with hyponatremia and volume excess. Presumed ESRD  Planning for outpatient dialysis and access placement. Next dialysis in AM Plan For AV access Monday. CLIP.  Subjective: Interval History: Had a better dialysis treatment yesterday.  Objective: Vital signs in last 24 hours: Temp:  [97.8 F (36.6 C)-99.4 F (37.4 C)] 98 F (36.7 C) (04/19 1032) Pulse Rate:  [60-75] 66 (04/19 1032) Resp:  [18] 18 (04/19 1032) BP: (106-159)/(47-89) 133/58 mmHg (04/19 1032) SpO2:  [94 %-96 %] 96 % (04/19 1032) Weight:  [79.6 kg (175 lb 7.8 oz)-82.5 kg (181 lb 14.1 oz)] 79.6 kg (175 lb 7.8 oz) (04/19 1000) Weight change: -2.2 kg (-4 lb 13.6 oz)  Intake/Output from previous day: 04/18 0701 - 04/19 0700 In: 720 [P.O.:720] Out: 200 [Urine:200] Intake/Output this shift: Total I/O In: 240 [P.O.:240] Out: 2479 [Urine:1; Other:2478]  General appearance: alert and cooperative Resp: diminished breath sounds base - . Cardio: regular rate and rhythm GI: soft, non-tender; bowel sounds normal; no masses,  no organomegaly Extremities: edema 2+ Lab Results:  Recent Labs  03/02/13 0500 03/04/13 0635  WBC 7.4 5.4  HGB 8.1* 7.9*  HCT 25.4* 24.4*  PLT 286 278   BMET:  Recent Labs  03/02/13 0500 03/04/13 0635  NA 125* 130*  K 4.6 4.2  CL 91* 94*  CO2 26 28  GLUCOSE 124* 115*  BUN 30* 16  CREATININE 5.32* 3.88*  CALCIUM 8.7 8.6   No results found for this basename: PTH,  in the last 72 hours Iron Studies: No results found for this basename: IRON, TIBC, TRANSFERRIN, FERRITIN,  in the last 72 hours Studies/Results: No results found.  Scheduled: . antiseptic oral rinse  15 mL Mouth Rinse BID  . aspirin EC  81 mg Oral Daily  . calcitRIOL  0.5 mcg Oral Daily  . calcium carbonate  1 tablet Oral TID WC  . darbepoetin (ARANESP) injection - NON-DIALYSIS  100 mcg Subcutaneous Q Wed-1800  . heparin subcutaneous   5,000 Units Subcutaneous Q8H  . heparin lock flush  2,800 Units Intravenous Once  . insulin aspart  0-9 Units Subcutaneous TID WC  . metoCLOPramide  5 mg Oral QAC breakfast  . pantoprazole  40 mg Oral Daily  . sodium chloride  3 mL Intravenous Q12H     LOS: 19 days   [name] [name] C 03/04/2013,12:27 PM

## 2013-03-04 NOTE — Progress Notes (Signed)
Pt awake and on dialysis  Filed Vitals:   03/04/13 0730 03/04/13 0800 03/04/13 0830 03/04/13 0900  BP: 106/47 113/57 136/61 134/66  Pulse: 62 60 60 61  Temp:      TempSrc:      Resp:      Height:      Weight:      SpO2:       Left upper extremity 2+ brachial and radial pulse  Vein map reviewed: Left basilic 3.5 to 8 mm, left cephalic upper 2.5-3.6 mm, right cephalic small, right basilic 3-7 mm  A: Needs hemodialysis access P: Left brachial cephalic AVF possibly Monday 4/21 but most likely Tuesday 4/22.  Risks benefits possible complications discussed with patient including but not limited to bleeding infection steal non maturation.  She wishes to proceed.  Fabienne Bruns, MD [facility] Office: [phone] Pager: [phone]

## 2013-03-04 NOTE — Progress Notes (Signed)
FMTS Attending Daily Note: Sara Neal MD [phone] pager office [phone] I have discussed this patient with the resident and reviewed the assessment and plan as documented above. I agree wit the resident's findings and plan.  

## 2013-03-05 DIAGNOSIS — E860 Dehydration: Secondary | ICD-10-CM

## 2013-03-05 LAB — CBC
HCT: 26 % — ABNORMAL LOW (ref 36.0–46.0)
Hemoglobin: 8.1 g/dL — ABNORMAL LOW (ref 12.0–15.0)
MCH: 29.9 pg (ref 26.0–34.0)
MCHC: 31.2 g/dL (ref 30.0–36.0)
MCV: 95.9 fL (ref 78.0–100.0)

## 2013-03-05 LAB — RENAL FUNCTION PANEL
BUN: 16 mg/dL (ref 6–23)
Calcium: 8.8 mg/dL (ref 8.4–10.5)
Creatinine, Ser: 3.42 mg/dL — ABNORMAL HIGH (ref 0.50–1.10)
Glucose, Bld: 173 mg/dL — ABNORMAL HIGH (ref 70–99)
Phosphorus: 2.7 mg/dL (ref 2.3–4.6)

## 2013-03-05 MED ORDER — VENLAFAXINE HCL ER 37.5 MG PO CP24
37.5000 mg | ORAL_CAPSULE | Freq: Every day | ORAL | Status: DC
  Administered 2013-03-05 – 2013-03-08 (×3): 37.5 mg via ORAL
  Filled 2013-03-05 (×6): qty 1

## 2013-03-05 MED ORDER — ALBUMIN HUMAN 25 % IV SOLN
INTRAVENOUS | Status: AC
  Administered 2013-03-05: 25 g via INTRAVENOUS
  Filled 2013-03-05: qty 100

## 2013-03-05 MED ORDER — ALBUMIN HUMAN 25 % IV SOLN: 25.0000 g | Freq: Once | INTRAVENOUS | Status: AC

## 2013-03-05 NOTE — Progress Notes (Signed)
FMTS Attending Daily Note: Adyen Bifulco MD [phone] pager office [phone] I have discussed this patient with the resident and reviewed the assessment and plan as documented above. I agree wit the resident's findings and plan.  

## 2013-03-05 NOTE — Progress Notes (Signed)
Family Medicine Teaching Service Daily Progress Note Service Page: [phone]  Subjective:  Pt doing well this morning. States she is feeling more depressed and overwhelmed. She appears very knowledgeable about her condition and the plan while she is here in the hospital. HD yesterday with 2.478L off  Medications reviewed: No PRN in 24 hours.  Objective: BP 152/64  Pulse 70  Temp(Src) 98.8 F (37.1 C) (Oral)  Resp 18  Ht 5\' 5"  (1.651 m)  Wt 186 lb 8.2 oz (84.6 kg)  BMI 31.04 kg/m2  SpO2 95%   Exam:  General: NAD, sitting up in chair washing off. Very talkative but intermittently crying Cardiovascular: RRR. No murmurs, rubs, or gallops though distant. Respiratory: CTAB anteriorly. No wheezing appreciated. No increased work of breathing. HD Cath Right upper chest C/D/I Abdomen: obese, soft, nondistended  Extremities: No LE edema; tenderness to palpation at ankles and shin but not calves (pt states this is chronic), no cord, no erythema or size difference NEURO: Awake, alert, normal speech, no focal findings, hard of hearing SKIN: No rash or cyanosis PSYCH: Makes appropriate eye contact, but becomes very tearful when talking about HD, diet changes, and outcomes.  Intake/Output Summary (Last 24 hours) at 03/05/13 0846 Last data filed at 03/05/13 0814  Gross per 24 hour  Intake    780 ml  Output   2481 ml  Net  -1701 ml   Labs/Imaging -    Recent Labs Lab 03/01/13 0655 03/02/13 0500 03/04/13 0635  HGB 8.1* 8.1* 7.9*  HCT 25.7* 25.4* 24.4*  WBC 7.0 7.4 5.4  PLT 243 286 278    Recent Labs Lab 02/27/13 0720 02/28/13 0635 03/01/13 0655 03/02/13 0500 03/04/13 0635  NA 130* 134* 129* 125* 130*  K 4.8 3.7 4.4 4.6 4.2  CL 94* 97 94* 91* 94*  CO2 29 30 27 26 28   GLUCOSE 141* 132* 125* 124* 115*  BUN 31* 16 24* 30* 16  CREATININE 5.33* 3.47* 4.50* 5.32* 3.88*  CALCIUM 9.2 8.4 8.5 8.7 8.6  PHOS 2.3 2.5 2.4 3.2 3.1   Upper extremity doppler - all appear  compressible   Assessment and Plan: MAKALEY STORTS is a 62 y.o. female with PMH of DM-2, muscle spasms, and emphysematous pylenephritis s/p right nephrectomy who presented with nausea/vomiting for approximately 2 weeks and acute kidney injury with associated metabolic acidosis and hyperkalemia. Nausea/vomiting have resolved but kidney injury remains despite adequate hydration.  # AKI - Unclear etiology - Prerenal ATN suspected though prolonged course, other etiology with solitary kidney?. Cr ~8 on admission from baseline <1, currently around 3-4 with HD. BP has been tenuous. Not on antihypertensive. Per renal US and GU, no signs of obstruction. Not improved with IV fluids or with continued HD. Do not want to biopsy as this is pt's solitary kidney. - HD yesterday. Recovery less likely per renal, which patient is aware of - E coli UTI s/p treatment of CTX x 10 days., neg 4/10 culture - Renal following and we greatly appreciate further recommendations. Per renal recs.    - 4/5, 4/6, 4/8, 4/10, 4/14, 4/18, 4/19    - ANCA, anti GBM, ANA  Negative, urine eosinophils pending  - Continue calcium carbonate for low calcium and calcitriol - Remain in house while waiting improvement vs 6 weeks  - Upper extremity doppler to eval placement of long-term HD access. Planned placement of permanent access 03/06/13.  # Anemia - Hgb stable at ~8. Iron panel with sat 23%, ferritin 495, folate  8.4; IV Fe per renal, FOBT neg x 2  - IV iron, aranesp that renal is managing - Will monitor closely during admission. - Transfuse if persistent Hgb <7  # Hyperkalemia with mild T wave peaking on EKG- s/p kayexalate with normal Mg. Resolved.  # Itching - Severe itching, prn atarax, seems resolved. Could have been due to renal disease - Discontinue when possible  # Atrial fibrillation with RVR - Brief episode 4/8 with vomiting, started diltiazem drip and pt came out of episode. With borderline low blood pressures, stopped  diltiazem drip and pt stable with no further episodes. - NSR  # Metabolic acidosis - Thought to be likely from diarrhea. Corrected after IV fluids with bicarbonate - Now getting HD with tighter electrolyte control in dialysate    # Nausea/vomiting/diarrhea - Improved with HD and reglan; likely from azotemia and gastroparesis - GI consulted and found normal EGD and gastric emptying study with moderate slowing - H pylori IgG positive - will hold off on treatment as no EGD findings and do not want to muddy the waters with potential for c diff infection or GI symptoms with antibiotics if not needed - Zofran PRN, phenergan prn - Protonix daily - Reglan tapered from TID to daily, eventual d/c. GI signed off 4/11 - Diarrhea yesterday, likely from miralax. D/c'ed yesterday, no diarrhea since - if another episode, test for c difficile  # DM-2 - A1c 6.1 - CBG's TID AC and QHS  - Sensitive SSI  - RD discussed diet with pt. Will encourage another visit, and patient's request. She is still confused about carb control and renal diet.  # Muscle spasms of back  - Tylenol PRN; will refrain from flexeril due to anticholinergic effects   # Hypertension - Improved over 24 hours, continue to monitor - Renal disease - Continue to monitor - Out of bed with assistance   # Diverticulosis with no evidence of diverticulitis on CT  # CHD risk with DM Type II and HTN - continue low-dose aspirin  # Hard of hearing - Pt would benefit from hearing aid outpatient  # Psych - Pt is facing a major life transition with renal injury that is seeming more permanent to require long term hemodialysis. When I asked if she would like to start any medication or speak with spiritual counselor, she opted to speak with spiritual counselor and stated [facility] has worked for her in the past. - [facility] [place] not available in the hospital. Patient is requesting to start something for depression. SSRI has not worked well for her  in the past (started on Prozac twice which worsened her depression/mood). Will start Effexor low dose for her. Renal adjustment for HD is dose reduction by 50% given after HD.  - Spiritual services will f/u as needed  # FEN/GI: SLIV; diet carb modified with low sodium - GI: d/c'ed miralax with loose stools - c difficile pcr if continued loose stools  # Prophylaxis:  - IS to bedside, encourage OOB with asst - started SQ heparin 4/13  # Disposition: Pending clinical improvement in AKI - PT- home health PT with rolling walker and 5" wheels; OT - 3-in-1 bedside commode.   - Consulted CM in hopes of earlier discharge with outpatient HD but this is not possible. - Renal feels pt will need long-term HD. Will re-consult CM and SW to discuss with pt insurance and cost issues - Will be important to f/u closely with PCP  # Code  Status: Full code  Melrose Nakayama, MD 03/05/2013, 8:46 AM

## 2013-03-05 NOTE — Procedures (Signed)
Tolerating hemodialysis with some hemodynamic instability treated with IV albumin, goal of  3.5 liters.  For av access placement and outpatient HD arrangement in planning process  ASSESSMENT/RECOMMENDATIONS  Renal failure in patient with solitary kidney, rising creat with hyponatremia and volume excess. Presumed ESRD  [name] [name]

## 2013-03-06 LAB — GLUCOSE, CAPILLARY
Glucose-Capillary: 145 mg/dL — ABNORMAL HIGH (ref 70–99)
Glucose-Capillary: 136 mg/dL — ABNORMAL HIGH (ref 70–99)
Glucose-Capillary: 106 mg/dL — ABNORMAL HIGH (ref 70–99)

## 2013-03-06 MED ORDER — CEFAZOLIN SODIUM 1-5 GM-% IV SOLN: 1.0000 g | INTRAVENOUS | Status: DC

## 2013-03-06 MED ORDER — CEFAZOLIN SODIUM 1-5 GM-% IV SOLN: 1.0000 g | Freq: Once | INTRAVENOUS | Status: DC

## 2013-03-06 MED ORDER — CEFAZOLIN SODIUM-DEXTROSE 2-3 GM-% IV SOLR
2.0000 g | INTRAVENOUS | Status: AC
  Administered 2013-03-07: 2 g via INTRAVENOUS
  Filled 2013-03-06: qty 50

## 2013-03-06 MED ORDER — DOUBLE ANTIBIOTIC 500-10000 UNIT/GM EX OINT
1.0000 "application " | TOPICAL_OINTMENT | Freq: Two times a day (BID) | CUTANEOUS | Status: DC
  Administered 2013-03-06 – 2013-03-08 (×5): 1 via TOPICAL
  Filled 2013-03-06 (×3): qty 1
  Filled 2013-03-06: qty 14.17
  Filled 2013-03-06 (×3): qty 1

## 2013-03-06 MED ORDER — CEFAZOLIN SODIUM-DEXTROSE 2-3 GM-% IV SOLR
2.0000 g | INTRAVENOUS | Status: DC
  Filled 2013-03-06: qty 50

## 2013-03-06 MED ORDER — POLYETHYLENE GLYCOL 3350 17 G PO PACK
17.0000 g | PACK | Freq: Every day | ORAL | Status: DC
  Administered 2013-03-06: 17 g via ORAL
  Filled 2013-03-06 (×2): qty 1

## 2013-03-06 MED ORDER — PARICALCITOL 5 MCG/ML IV SOLN
2.0000 ug | INTRAVENOUS | Status: DC
  Filled 2013-03-06 (×2): qty 0.4

## 2013-03-06 MED ORDER — DOCUSATE SODIUM 100 MG PO CAPS
100.0000 mg | ORAL_CAPSULE | Freq: Every day | ORAL | Status: DC
  Administered 2013-03-06: 100 mg via ORAL
  Filled 2013-03-06: qty 1

## 2013-03-06 NOTE — Progress Notes (Signed)
Utilization review completed.  

## 2013-03-06 NOTE — Progress Notes (Signed)
I discussed with Dr Thekkekandam.  I agree with their plans documented in their progress note for today.  

## 2013-03-06 NOTE — Progress Notes (Signed)
Chaplain Note: Found pt sitting in recliner by the window.  Pt appeared to be in much better spirits...not crying like before.  Pt states she sent for chaplain because she was starting to feel herself spiral downwards again and needed to talk. States she has now been put on medication for depression.  Gave pt copy of serenity prayer she had requested the last time I visited her.  Pt delighted that I remembered. Pt also talked about some of her ongoing frustrations.  Will follow up tomorrow.  Rutherford Nail Chaplain Resident

## 2013-03-06 NOTE — Plan of Care (Signed)
Problem: Food- and Nutrition-Related Knowledge Deficit (NB-1.1) Goal: Nutrition education Formal process to instruct or train a patient/client in a skill or to impart knowledge to help patients/clients voluntarily manage or modify food choices and eating behavior to maintain or improve health. Outcome: Completed/Met Date Met:  03/06/13 Nutrition Education Note  RD consulted for Renal Education. Provided Choose-A-Meal Booklet to patient/family. Reviewed food groups and provided written recommended serving sizes specifically determined for patient's current nutritional status.   Explained why diet restrictions are needed and provided lists of foods to limit/avoid that are high potassium, sodium, and phosphorus. Provided specific recommendations on safer alternatives of these foods. Strongly encouraged compliance of this diet.   Discussed importance of protein intake at each meal and snack. Provided examples of how to maximize protein intake throughout the day. Discussed need for fluid restriction with dialysis, importance of minimizing weight gain between HD treatments, and renal-friendly beverage options.  Encouraged pt to discuss specific diet questions/concerns with RD at HD outpatient facility. Teach back method used.  Expect good compliance.  Body mass index is 28.76 kg/(m^2). Pt meets criteria for overweight based on current BMI.  Current diet order is NPO. Labs and medications reviewed. No further nutrition interventions warranted at this time. RD contact information provided. If additional nutrition issues arise, please re-consult RD.  Jarold Motto MS, RD, LDN Pager: [phone] After-hours pager: [phone]

## 2013-03-06 NOTE — Progress Notes (Signed)
Family Medicine Teaching Service Daily Progress Note Service Page: [phone]  Subjective:  HD yesterday, started effexor yesterday and feels somewhat drowsy. No chest pain or shortness of breath reported. Had small hard BM today and water, requesting laxative.  Medications reviewed: No PRN in 24 hours.  Objective: BP 138/60  Pulse 68  Temp(Src) 98.4 F (36.9 C) (Oral)  Resp 18  Ht 5\' 5"  (1.651 m)  Wt 172 lb 13.5 oz (78.401 kg)  BMI 28.76 kg/m2  SpO2 93%   Exam:  General: NAD, sitting up in chair. Very talkative and appears comfortable Cardiovascular: RRR. No murmurs, rubs, or gallops though distant. Respiratory: CTAB. No wheezing appreciated. No increased work of breathing. HD Cath Right upper chest C/D/I Abdomen: obese, soft, nondistended  Extremities: No LE edema; tenderness to palpation at ankles and shin but not calves (pt states this is chronic), no cord, no erythema or size difference NEURO: Awake, alert, normal speech, no focal findings, hard of hearing SKIN: excoriations that are healing scabs on thighs and upper back - upper back excoriations with mild erythema and honey-colored crust PSYCH: Makes appropriate eye contact, but appears upset when talking about hemodialysis.  Intake/Output Summary (Last 24 hours) at 03/06/13 0914 Last data filed at 03/06/13 0700  Gross per 24 hour  Intake    780 ml  Output   3019 ml  Net  -2239 ml   Labs/Imaging -    Recent Labs Lab 03/02/13 0500 03/04/13 0635 03/05/13 0948  HGB 8.1* 7.9* 8.1*  HCT 25.4* 24.4* 26.0*  WBC 7.4 5.4 7.0  PLT 286 278 267    Recent Labs Lab 02/28/13 0635 03/01/13 0655 03/02/13 0500 03/04/13 0635 03/05/13 0948  NA 134* 129* 125* 130* 132*  K 3.7 4.4 4.6 4.2 3.7  CL 97 94* 91* 94* 96  CO2 30 27 26 28 29   GLUCOSE 132* 125* 124* 115* 173*  BUN 16 24* 30* 16 16  CREATININE 3.47* 4.50* 5.32* 3.88* 3.42*  CALCIUM 8.4 8.5 8.7 8.6 8.8  PHOS 2.5 2.4 3.2 3.1 2.7   Upper extremity doppler - all  appear compressible   Assessment and Plan: [name] [name] is a 62 y.o. female with PMH of DM-2, muscle spasms, and emphysematous pylenephritis s/p right nephrectomy who presented with nausea/vomiting for approximately 2 weeks and acute kidney injury with associated metabolic acidosis and hyperkalemia. Nausea/vomiting have resolved but kidney injury remains despite adequate hydration.  # AKI - Unclear etiology - Prerenal ATN suspected though prolonged course, other etiology with solitary kidney?. Cr ~8 on admission from baseline <1, currently around 3-4 with HD. BP has been tenuous. Not on antihypertensive. Per renal US and GU, no signs of obstruction. Not improved with IV fluids or with continued HD. Do not want to biopsy as this is pt's solitary kidney. - Recovery less likely per renal, which patient is aware of - E coli UTI s/p treatment of CTX x 10 days., neg 4/10 culture - Renal following and we greatly appreciate further recommendations. Per renal recs.    - 4/5, 4/6, 4/8, 4/10, 4/14, 4/18, 4/19 (begin T-Th-Sat regimen per pt)    - ANCA, anti GBM, ANA  Negative, urine eosinophils pending  - Continue calcium carbonate for low calcium and calcitriol - Remain in house while waiting improvement vs 6 weeks  - Upper extremity doppler to eval placement of long-term HD access. Planned placement of permanent access 03/07/13.  # Anemia - Hgb stable at ~8. Iron panel with sat 23%, ferritin  495, folate 8.4; IV Fe per renal, FOBT neg x 2  - IV iron, aranesp that renal is managing - Will monitor closely during admission. - Transfuse if persistent Hgb <7  # Hyperkalemia with mild T wave peaking on EKG- s/p kayexalate with normal Mg. Resolved.  # Itching - Severe itching, prn atarax, seems resolved. Could have been due to renal disease - Discontinue when possible' - topical polysporin abx ointment to excoriation on back BID x 3 days  # Atrial fibrillation with RVR - Brief episode 4/8 with vomiting,  started diltiazem drip and pt came out of episode. With borderline low blood pressures, stopped diltiazem drip and pt stable with no further episodes. - NSR  # Metabolic acidosis - Thought to be likely from diarrhea. Corrected after IV fluids with bicarbonate - Now getting HD with tighter electrolyte control in dialysate    # Nausea/vomiting/diarrhea - Improved with HD and reglan; likely from azotemia and gastroparesis - GI consulted and found normal EGD and gastric emptying study with moderate slowing - H pylori IgG positive - will hold off on treatment as no EGD findings and do not want to muddy the waters with potential for c diff infection or GI symptoms with antibiotics if not needed - Zofran PRN, phenergan prn - Protonix daily - Reglan tapered from TID to daily, eventual d/c. GI signed off 4/11 - Had been constipated so started miralax, d/c'ed with 2 d loose stools, but today again a hard BM with some watery - collect for c diff - Restart daily miralax and start colace per pt request due to hard stool  # DM-2 - A1c 6.1 - CBG's TID AC and QHS  - Sensitive SSI  - RD discussed diet with pt. Will encourage visits prn at patient's request. She is still confused about carb control and renal diet.  # Muscle spasms of back  - Tylenol PRN; will refrain from flexeril due to anticholinergic effects   # Hypertension - Large range (43X-540G systolic), continue to monitor - Renal disease - Continue to monitor - Out of bed with assistance to avoid fall with hypotensive syncope   # Diverticulosis with no evidence of diverticulitis on CT  # CHD risk with DM Type II and HTN - continue low-dose aspirin  # Hard of hearing - Pt would benefit from hearing aid outpatient  # Psych - Pt is facing a major life transition with renal injury that is seeming more permanent to require long term hemodialysis. When I asked if she would like to start any medication or speak with spiritual counselor, she opted  to speak with spiritual counselor and stated [facility] has worked for her in the past. - [place] not available in the hospital. SSRI has not worked well for her in the past (started on Prozac twice which worsened her depression/mood). Will start Effexor low dose for her per pt request. Renal adjustment for HD is dose reduction by 50% given after HD.  - Spiritual services will f/u as needed  # FEN/GI: SLIV; renal and carb modified diet; NPO after midnight - GI: d/c'ed miralax with loose stools - c difficile pcr if continued loose stools  # Prophylaxis:  - IS to bedside, encourage OOB with asst - started SQ heparin 4/13  # Disposition: Pending clinical improvement in AKI - PT- home health PT with rolling walker and 5" wheels; OT - 3-in-1 bedside commode.   - Consulted CM in hopes of earlier discharge with  outpatient HD but this is not possible. - Renal feels pt will need long-term HD. Will re-consult CM and SW to discuss with pt insurance and cost issues - Will be important to f/u closely with PCP  # Code Status: Full code  Conni Slipper, MD 03/06/2013, 9:14 AM

## 2013-03-06 NOTE — Progress Notes (Signed)
Physical Therapy Treatment Patient Details Name: [name] [name] MRN: [id number] DOB: [birth year] Today's Date: 03/06/2013 Time: 1208-1221 PT Time Calculation (min): 13 min  PT Assessment / Plan / Recommendation Comments on Treatment Session  Patient continues to be motivated to keep moving.  Decreased distance today - patient feeling shaky due to NPO status.    Follow Up Recommendations  Home health PT     Does the patient have the potential to tolerate intense rehabilitation     Barriers to Discharge        Equipment Recommendations  Rolling walker with 5" wheels    Recommendations for Other Services    Frequency Min 3X/week   Plan Discharge plan remains appropriate    Precautions / Restrictions Precautions Precautions: Fall Restrictions Weight Bearing Restrictions: No   Pertinent Vitals/Pain     Mobility  Bed Mobility Bed Mobility: Not assessed Transfers Transfers: Sit to Stand;Stand to Sit Sit to Stand: 6: Modified independent (Device/Increase time);With upper extremity assist;With armrests;From chair/3-in-1 Stand to Sit: 6: Modified independent (Device/Increase time);With upper extremity assist;With armrests;To chair/3-in-1 Details for Transfer Assistance: No cues or assist needed Ambulation/Gait Ambulation/Gait Assistance: 5: Supervision Ambulation Distance (Feet): 220 Feet Assistive device: Rolling walker Ambulation/Gait Assistance Details: Good gait pattern Gait Pattern: Step-through pattern;Decreased step length - right;Decreased step length - left;Trunk flexed      PT Goals Acute Rehab PT Goals Pt will go Sit to Stand: Independently PT Goal: Sit to Stand - Progress: Progressing toward goal Pt will go Stand to Sit: Independently PT Goal: Stand to Sit - Progress: Progressing toward goal Pt will Transfer Bed to Chair/Chair to Bed: with modified independence PT Transfer Goal: Bed to Chair/Chair to Bed - Progress: Met Pt will Ambulate: Independently;>150  feet PT Goal: Ambulate - Progress: Progressing toward goal  Visit Information  Last PT Received On: 03/06/13 Assistance Needed: +1    Subjective Data  Subjective: "I'm shaky today.  I haven't eaten since last night."  (Patient NPO)   Cognition  Cognition Arousal/Alertness: Awake/alert Behavior During Therapy: WFL for tasks assessed/performed Overall Cognitive Status: Within Functional Limits for tasks assessed    Balance     End of Session PT - End of Session Equipment Utilized During Treatment: Gait belt Activity Tolerance: Patient tolerated treatment well;Patient limited by fatigue (Patient NPO - had not eaten all day) Patient left: in chair;with call bell/phone within reach Nurse Communication: Mobility status (Question NPO - MD states surgery not until tomorrow)   GP     Vena Austria 03/06/2013, 1:12 PM Durenda Hurt. Renaldo Fiddler, [facility] Acute Rehab Services Pager [phone]

## 2013-03-06 NOTE — Progress Notes (Signed)
Subjective: Interval History: has complaints feels much better.  Objective: Vital signs in last 24 hours: Temp:  [97.3 F (36.3 C)-98.6 F (37 C)] 98.4 F (36.9 C) (04/21 0844) Pulse Rate:  [59-72] 68 (04/21 0844) Resp:  [18-19] 18 (04/21 0844) BP: (78-140)/(33-70) 138/60 mmHg (04/21 0844) SpO2:  [93 %-100 %] 93 % (04/21 0844) Weight:  [78.4 kg (172 lb 13.5 oz)-78.401 kg (172 lb 13.5 oz)] 78.401 kg (172 lb 13.5 oz) (04/20 2045) Weight change: 2 kg (4 lb 6.6 oz)  Intake/Output from previous day: 04/20 0701 - 04/21 0700 In: 840 [P.O.:840] Out: 3019  Intake/Output this shift:    General appearance: alert, cooperative, mildly obese and pale Resp: clear to auscultation bilaterally Chest wall: no tenderness, R IJ cath Cardio: S1, S2 normal and systolic murmur: holosystolic 2/6, blowing at apex GI: obese, pos bs, liver down 5 cm Extremities: edema 1+ Hyperpigmentation on back  Lab Results:  Recent Labs  03/04/13 0635 03/05/13 0948  WBC 5.4 7.0  HGB 7.9* 8.1*  HCT 24.4* 26.0*  PLT 278 267   BMET:  Recent Labs  03/04/13 0635 03/05/13 0948  NA 130* 132*  K 4.2 3.7  CL 94* 96  CO2 28 29  GLUCOSE 115* 173*  BUN 16 16  CREATININE 3.88* 3.42*  CALCIUM 8.6 8.8   No results found for this basename: PTH,  in the last 72 hours Iron Studies: No results found for this basename: IRON, TIBC, TRANSFERRIN, FERRITIN,  in the last 72 hours  Studies/Results: No results found.  I have reviewed the patient's current medications.  Assessment/Plan: 1 CRF HD yest, vol xs, HD on Tues.  For access. 2 Anemia eval 3 HPTH iv Vit D 4 DM controlled. P HD, epo, fe, Vit D    LOS: 21 days   Kella Splinter L 03/06/2013,9:52 AM

## 2013-03-07 ENCOUNTER — Telehealth: Payer: Self-pay | Admitting: Vascular Surgery

## 2013-03-07 ENCOUNTER — Encounter (HOSPITAL_COMMUNITY): Payer: Self-pay | Admitting: Anesthesiology

## 2013-03-07 ENCOUNTER — Encounter (HOSPITAL_COMMUNITY)
Admission: EM | Disposition: A | Discharge status: Home Health | Payer: Self-pay | Source: Home / Self Care | Attending: Family Medicine

## 2013-03-07 ENCOUNTER — Inpatient Hospital Stay (HOSPITAL_COMMUNITY): Payer: Medicaid Other | Admitting: Anesthesiology

## 2013-03-07 HISTORY — PX: AV FISTULA PLACEMENT: SHX1204

## 2013-03-07 LAB — COMPREHENSIVE METABOLIC PANEL
ALT: <5 U/L (ref 0–35)
Alkaline Phosphatase: 156 U/L — ABNORMAL HIGH (ref 39–117)
BUN: 29 mg/dL — ABNORMAL HIGH (ref 6–23)
CO2: 27 mEq/L (ref 19–32)
Calcium: 8.7 mg/dL (ref 8.4–10.5)
GFR calc Af Amer: 11 mL/min — ABNORMAL LOW (ref >90)
GFR calc non Af Amer: 10 mL/min — ABNORMAL LOW (ref >90)
Glucose, Bld: 156 mg/dL — ABNORMAL HIGH (ref 70–99)
Sodium: 134 mEq/L — ABNORMAL LOW (ref 135–145)

## 2013-03-07 LAB — IRON AND TIBC: TIBC: 182 ug/dL — ABNORMAL LOW (ref 250–470)

## 2013-03-07 LAB — BASIC METABOLIC PANEL
BUN: 25 mg/dL — ABNORMAL HIGH (ref 6–23)
Calcium: 9.2 mg/dL (ref 8.4–10.5)
Creatinine, Ser: 4.31 mg/dL — ABNORMAL HIGH (ref 0.50–1.10)
GFR calc non Af Amer: 10 mL/min — ABNORMAL LOW (ref >90)
Glucose, Bld: 136 mg/dL — ABNORMAL HIGH (ref 70–99)
Potassium: 4.3 mEq/L (ref 3.5–5.1)

## 2013-03-07 LAB — CBC
HCT: 28 % — ABNORMAL LOW (ref 36.0–46.0)
Hemoglobin: 9 g/dL — ABNORMAL LOW (ref 12.0–15.0)
Hemoglobin: 8.9 g/dL — ABNORMAL LOW (ref 12.0–15.0)
MCH: 30.2 pg (ref 26.0–34.0)
MCH: 30.4 pg (ref 26.0–34.0)
MCHC: 31.7 g/dL (ref 30.0–36.0)
MCHC: 31.8 g/dL (ref 30.0–36.0)
MCV: 95.6 fL (ref 78.0–100.0)
RBC: 2.93 MIL/uL — ABNORMAL LOW (ref 3.87–5.11)

## 2013-03-07 LAB — GLUCOSE, CAPILLARY
Glucose-Capillary: 134 mg/dL — ABNORMAL HIGH (ref 70–99)
Glucose-Capillary: 190 mg/dL — ABNORMAL HIGH (ref 70–99)

## 2013-03-07 LAB — PHOSPHORUS: Phosphorus: 3.3 mg/dL (ref 2.3–4.6)

## 2013-03-07 LAB — SURGICAL PCR SCREEN: Staphylococcus aureus: NEGATIVE

## 2013-03-07 SURGERY — ARTERIOVENOUS (AV) FISTULA CREATION
Anesthesia: Monitor Anesthesia Care | Site: Arm Upper | Laterality: Left | Wound class: Clean

## 2013-03-07 MED ORDER — PROTAMINE SULFATE 10 MG/ML IV SOLN
INTRAVENOUS | Status: DC | PRN
  Administered 2013-03-07: 20 mg via INTRAVENOUS
  Administered 2013-03-07 (×3): 10 mg via INTRAVENOUS

## 2013-03-07 MED ORDER — LIDOCAINE HCL (PF) 1 % IJ SOLN: 5.0000 mL | INTRAMUSCULAR | Status: DC | PRN

## 2013-03-07 MED ORDER — LIDOCAINE HCL (PF) 1 % IJ SOLN
INTRAMUSCULAR | Status: DC | PRN
  Administered 2013-03-07: 30 mL

## 2013-03-07 MED ORDER — PENTAFLUOROPROP-TETRAFLUOROETH EX AERO: 1.0000 "application " | INHALATION_SPRAY | CUTANEOUS | Status: DC | PRN

## 2013-03-07 MED ORDER — HEPARIN SODIUM (PORCINE) 1000 UNIT/ML DIALYSIS
20.0000 [IU]/kg | INTRAMUSCULAR | Status: DC | PRN
  Filled 2013-03-07: qty 2

## 2013-03-07 MED ORDER — SODIUM CHLORIDE 0.9 % IR SOLN
Status: DC | PRN
  Administered 2013-03-07: 07:00:00

## 2013-03-07 MED ORDER — NEPRO/CARBSTEADY PO LIQD: 237.0000 mL | ORAL | Status: DC | PRN

## 2013-03-07 MED ORDER — HEPARIN SODIUM (PORCINE) 5000 UNIT/ML IJ SOLN
5000.0000 [IU] | Freq: Three times a day (TID) | INTRAMUSCULAR | Status: DC
  Administered 2013-03-07 – 2013-03-08 (×3): 5000 [IU] via SUBCUTANEOUS
  Filled 2013-03-07 (×5): qty 1

## 2013-03-07 MED ORDER — FENTANYL CITRATE 0.05 MG/ML IJ SOLN
INTRAMUSCULAR | Status: DC | PRN
  Administered 2013-03-07: 50 ug via INTRAVENOUS

## 2013-03-07 MED ORDER — DOCUSATE SODIUM 100 MG PO CAPS: 100.0000 mg | ORAL_CAPSULE | Freq: Every day | ORAL | Status: DC | PRN

## 2013-03-07 MED ORDER — HEPARIN SODIUM (PORCINE) 1000 UNIT/ML IJ SOLN
INTRAMUSCULAR | Status: DC | PRN
  Administered 2013-03-07: 5000 [IU] via INTRAVENOUS

## 2013-03-07 MED ORDER — HEPARIN SODIUM (PORCINE) 1000 UNIT/ML DIALYSIS
40.0000 [IU]/kg | Freq: Once | INTRAMUSCULAR | Status: DC
  Filled 2013-03-07: qty 4

## 2013-03-07 MED ORDER — ONDANSETRON HCL 4 MG/2ML IJ SOLN: 4.0000 mg | Freq: Once | INTRAMUSCULAR | Status: DC | PRN

## 2013-03-07 MED ORDER — PROPOFOL INFUSION 10 MG/ML OPTIME
INTRAVENOUS | Status: DC | PRN
  Administered 2013-03-07: 75 ug/kg/min via INTRAVENOUS

## 2013-03-07 MED ORDER — HYDROMORPHONE HCL PF 1 MG/ML IJ SOLN: 0.2500 mg | INTRAMUSCULAR | Status: DC | PRN

## 2013-03-07 MED ORDER — 0.9 % SODIUM CHLORIDE (POUR BTL) OPTIME
TOPICAL | Status: DC | PRN
  Administered 2013-03-07: 1000 mL

## 2013-03-07 MED ORDER — SODIUM CHLORIDE 0.9 % IV SOLN
INTRAVENOUS | Status: DC | PRN
  Administered 2013-03-07: 07:00:00 via INTRAVENOUS

## 2013-03-07 MED ORDER — CEFAZOLIN SODIUM-DEXTROSE 2-3 GM-% IV SOLR
INTRAVENOUS | Status: AC
  Filled 2013-03-07: qty 50

## 2013-03-07 MED ORDER — MORPHINE SULFATE 2 MG/ML IJ SOLN: 1.0000 mg | INTRAMUSCULAR | Status: DC | PRN

## 2013-03-07 MED ORDER — DARBEPOETIN ALFA-POLYSORBATE 100 MCG/0.5ML IJ SOLN: 100.0000 ug | INTRAMUSCULAR | Status: DC

## 2013-03-07 MED ORDER — OXYCODONE HCL 5 MG PO TABS: 5.0000 mg | ORAL_TABLET | Freq: Four times a day (QID) | ORAL | Status: DC | PRN

## 2013-03-07 MED ORDER — MIDAZOLAM HCL 5 MG/5ML IJ SOLN
INTRAMUSCULAR | Status: DC | PRN
  Administered 2013-03-07: 0.5 mg via INTRAVENOUS

## 2013-03-07 MED ORDER — LIDOCAINE-PRILOCAINE 2.5-2.5 % EX CREA: 1.0000 "application " | TOPICAL_CREAM | CUTANEOUS | Status: DC | PRN

## 2013-03-07 MED ORDER — LIDOCAINE HCL (PF) 1 % IJ SOLN
INTRAMUSCULAR | Status: AC
  Filled 2013-03-07: qty 30

## 2013-03-07 MED ORDER — SODIUM CHLORIDE 0.9 % IV SOLN: 100.0000 mL | INTRAVENOUS | Status: DC | PRN

## 2013-03-07 MED ORDER — ALTEPLASE 2 MG IJ SOLR
2.0000 mg | Freq: Once | INTRAMUSCULAR | Status: DC | PRN
  Filled 2013-03-07: qty 2

## 2013-03-07 MED ORDER — POLYETHYLENE GLYCOL 3350 17 G PO PACK
17.0000 g | PACK | Freq: Every day | ORAL | Status: DC | PRN
  Administered 2013-03-08 (×2): 17 g via ORAL
  Filled 2013-03-07: qty 1

## 2013-03-07 MED ORDER — HEPARIN SODIUM (PORCINE) 1000 UNIT/ML DIALYSIS
1000.0000 [IU] | INTRAMUSCULAR | Status: DC | PRN
  Filled 2013-03-07: qty 1

## 2013-03-07 SURGICAL SUPPLY — 40 items
CANISTER SUCTION 2500CC (MISCELLANEOUS) ×2 IMPLANT
CLIP TI MEDIUM 6 (CLIP) ×2 IMPLANT
CLIP TI WIDE RED SMALL 6 (CLIP) ×2 IMPLANT
CLOTH BEACON ORANGE TIMEOUT ST (SAFETY) ×2 IMPLANT
COVER PROBE W GEL 5X96 (DRAPES) ×2 IMPLANT
COVER SURGICAL LIGHT HANDLE (MISCELLANEOUS) ×2 IMPLANT
DECANTER SPIKE VIAL GLASS SM (MISCELLANEOUS) ×2 IMPLANT
DERMABOND ADVANCED (GAUZE/BANDAGES/DRESSINGS) ×1
DERMABOND ADVANCED .7 DNX12 (GAUZE/BANDAGES/DRESSINGS) ×1 IMPLANT
DRAIN PENROSE 1/4X12 LTX STRL (WOUND CARE) ×2 IMPLANT
ELECT REM PT RETURN 9FT ADLT (ELECTROSURGICAL) ×2
ELECTRODE REM PT RTRN 9FT ADLT (ELECTROSURGICAL) ×1 IMPLANT
GEL ULTRASOUND 20GR AQUASONIC (MISCELLANEOUS) IMPLANT
GLOVE BIO SURGEON STRL SZ7.5 (GLOVE) ×4 IMPLANT
GLOVE BIOGEL PI IND STRL 6.5 (GLOVE) ×1 IMPLANT
GLOVE BIOGEL PI IND STRL 7.0 (GLOVE) ×2 IMPLANT
GLOVE BIOGEL PI IND STRL 7.5 (GLOVE) ×1 IMPLANT
GLOVE BIOGEL PI INDICATOR 6.5 (GLOVE) ×1
GLOVE BIOGEL PI INDICATOR 7.0 (GLOVE) ×2
GLOVE BIOGEL PI INDICATOR 7.5 (GLOVE) ×1
GLOVE ECLIPSE 6.5 STRL STRAW (GLOVE) ×2 IMPLANT
GLOVE SS BIOGEL STRL SZ 6.5 (GLOVE) ×1 IMPLANT
GLOVE SUPERSENSE BIOGEL SZ 6.5 (GLOVE) ×1
GOWN PREVENTION PLUS XLARGE (GOWN DISPOSABLE) ×2 IMPLANT
GOWN STRL NON-REIN LRG LVL3 (GOWN DISPOSABLE) ×6 IMPLANT
KIT BASIN OR (CUSTOM PROCEDURE TRAY) ×2 IMPLANT
KIT ROOM TURNOVER OR (KITS) ×2 IMPLANT
LOOP VESSEL MINI RED (MISCELLANEOUS) IMPLANT
NS IRRIG 1000ML POUR BTL (IV SOLUTION) ×2 IMPLANT
PACK CV ACCESS (CUSTOM PROCEDURE TRAY) ×2 IMPLANT
PAD ARMBOARD 7.5X6 YLW CONV (MISCELLANEOUS) ×4 IMPLANT
SPONGE SURGIFOAM ABS GEL 100 (HEMOSTASIS) IMPLANT
SUT PROLENE 7 0 BV 1 (SUTURE) ×2 IMPLANT
SUT VIC AB 3-0 SH 27 (SUTURE) ×1
SUT VIC AB 3-0 SH 27X BRD (SUTURE) ×1 IMPLANT
SUT VICRYL 4-0 PS2 18IN ABS (SUTURE) ×2 IMPLANT
TOWEL OR 17X24 6PK STRL BLUE (TOWEL DISPOSABLE) ×2 IMPLANT
TOWEL OR 17X26 10 PK STRL BLUE (TOWEL DISPOSABLE) ×2 IMPLANT
UNDERPAD 30X30 INCONTINENT (UNDERPADS AND DIAPERS) ×2 IMPLANT
WATER STERILE IRR 1000ML POUR (IV SOLUTION) ×2 IMPLANT

## 2013-03-07 NOTE — Progress Notes (Signed)
Dr. Clinton Sawyer notified of pt BP 88/45 asymptomatic. Orders to recheck BP q 20 min x1 hr.

## 2013-03-07 NOTE — Progress Notes (Signed)
Subjective: Interval History: none.  Objective: Vital signs in last 24 hours: Temp:  [97.6 F (36.4 C)-99 F (37.2 C)] 98 F (36.7 C) (04/22 0927) Pulse Rate:  [59-70] 62 (04/22 0927) Resp:  [12-18] 16 (04/22 0927) BP: (149-172)/(66-82) 172/77 mmHg (04/22 0927) SpO2:  [93 %-98 %] 94 % (04/22 0927) Weight:  [81.6 kg (179 lb 14.3 oz)] 81.6 kg (179 lb 14.3 oz) (04/21 2149) Weight change: 0 kg (0 lb)  Intake/Output from previous day: 04/21 0701 - 04/22 0700 In: 480 [P.O.:480] Out: -  Intake/Output this shift: Total I/O In: 325 [I.V.:325] Out: -   General appearance: alert, cooperative, pale and HOH Resp: diminished breath sounds bilaterally Chest wall: no tenderness, RIJ cath Cardio: S1, S2 normal and systolic murmur: holosystolic 2/6, blowing at apex GI: pos bs, liver down 3 cm, soft Extremities: edema !+, AVF LUA B&T and pale  Lab Results:  Recent Labs  03/05/13 0948 03/07/13 0550  WBC 7.0 7.5  HGB 8.1* 9.0*  HCT 26.0* 28.4*  PLT 267 263   BMET:  Recent Labs  03/05/13 0948 03/07/13 0550  NA 132* 134*  K 3.7 4.3  CL 96 97  CO2 29 28  GLUCOSE 173* 136*  BUN 16 25*  CREATININE 3.42* 4.31*  CALCIUM 8.8 9.2   No results found for this basename: PTH,  in the last 72 hours Iron Studies: No results found for this basename: IRON, TIBC, TRANSFERRIN, FERRITIN,  in the last 72 hours  Studies/Results: No results found.  I have reviewed the patient's current medications.  Assessment/Plan: 1 CRF for HD Needs CLIP   2 Anemia on epo,/fe 3 HPTH vit D 4DM stable 5 HOH P HD epo/fe CLIP    LOS: 22 days   Malaka Ruffner L 03/07/2013,10:23 AM

## 2013-03-07 NOTE — Anesthesia Postprocedure Evaluation (Signed)
  Anesthesia Post-op Note  Patient: [name] [name]  Procedure(s) Performed: Procedure(s) with comments: ARTERIOVENOUS (AV) FISTULA CREATION (Left) - left brachiocephalic AVF  Patient Location: PACU  Anesthesia Type:MAC  Level of Consciousness: awake, alert , oriented and patient cooperative  Airway and Oxygen Therapy: Patient Spontanous Breathing  Post-op Pain: none  Post-op Assessment: Post-op Vital signs reviewed, Patient's Cardiovascular Status Stable, Respiratory Function Stable, Patent Airway, No signs of Nausea or vomiting and Pain level controlled  Post-op Vital Signs: stable  Complications: No apparent anesthesia complications

## 2013-03-07 NOTE — Anesthesia Preprocedure Evaluation (Addendum)
Anesthesia Evaluation  Patient identified by MRN, date of birth, ID band Patient awake    Reviewed: Allergy & Precautions, H&P , NPO status , Patient's Chart, lab work & pertinent test results  Airway Mallampati: I TM Distance: >3 FB Neck ROM: full    Dental  (+) Dental Advidsory Given and Poor Dentition   Pulmonary          Cardiovascular hypertension, Rhythm:regular Rate:Normal     Neuro/Psych PSYCHIATRIC DISORDERS    GI/Hepatic   Endo/Other  diabetes, Type 2  Renal/GU ESRF and DialysisRenal disease     Musculoskeletal   Abdominal   Peds  Hematology  (+) anemia ,   Anesthesia Other Findings   Reproductive/Obstetrics                          Anesthesia Physical Anesthesia Plan  ASA: III  Anesthesia Plan: MAC   Post-op Pain Management:    Induction: Intravenous  Airway Management Planned: Mask and Simple Face Mask  Additional Equipment:   Intra-op Plan:   Post-operative Plan:   Informed Consent: I have reviewed the patients History and Physical, chart, labs and discussed the procedure including the risks, benefits and alternatives for the proposed anesthesia with the patient or authorized representative who has indicated his/her understanding and acceptance.   Dental Advisory Given  Plan Discussed with: CRNA, Anesthesiologist and Surgeon  Anesthesia Plan Comments:        Anesthesia Quick Evaluation

## 2013-03-07 NOTE — Preoperative (Signed)
Beta Blockers   Reason not to administer Beta Blockers:Not Applicable 

## 2013-03-07 NOTE — Progress Notes (Signed)
I discussed with Dr Benjamin Stain.  I agree with their plans documented in their progress note for today.  Dr Deterdine of Renal Service stated that patient may be discharged home tomorrow.

## 2013-03-07 NOTE — Telephone Encounter (Addendum)
Message copied by Fredrich Birks on Tue Mar 07, 2013  9:13 AM ------      Message from: Marlowe Shores      Created: Tue Mar 07, 2013  8:36 AM       5-6 weeks AVF F/U - Fields ------  03/07/13- left message with pts spouse and mailed letter, dpm

## 2013-03-07 NOTE — Procedures (Signed)
I was present at this session.  I have reviewed the session itself and made appropriate changes.  bp drop quickly, keep even.  Kelsie Zaborowski L 4/22/20142:24 PM

## 2013-03-07 NOTE — Op Note (Signed)
Procedure: Left Brachial Cephalic AV fistula  Preop: ESRD  Postop: ESRD  Anesthesia: General  Assistant:Regina Roczniak PA-C  Findings: 3 mm cephalic vein  Procedure: After obtaining informed consent, the patient was taken to the operating room.  After , the left upper extremity was prepped and draped in usual sterile fashion.  A transverse incision was then made near the antecubital crease the left arm. The incision was carried into the subcutaneous tissues down to level of the cephalic vein. The cephalic vein was approximately 3 mm in diameter. It was of good quality. This was dissected free circumferentially and small side branches ligated and divided between silk ties or clips. Next the brachial artery was dissected free in the medial portion of the incision. The artery was  3-4 mm in diameter. The vessel loops were placed proximal and distal to the planned site of arteriotomy. The patient was given 5000 units of intravenous heparin. After appropriate circulation time, the vessel loops were used to control the artery. A longitudinal opening was made in the brachial artery.  The vein was ligated distally with a 2-0 silk tie. The vein was controlled proximally with a fine bulldog clamp. The vein was then swung over to the artery and sewn end of vein to side of artery using a running 7-0 Prolene suture. Just prior to completion of the anastomosis, everything was fore bled back bled and thoroughly flushed. The anastomosis was secured, vessel loops released, and there was a palpable thrill in the fistula immediately. After hemostasis was obtained, the subcutaneous tissues were reapproximated using a running 3-0 Vicryl suture. The skin was then closed with a 4 Vicryl subcuticular stitch. Dermabond was applied to the skin incision.  The patient had a palpable radial pulse at the end of the case.  Fabienne Bruns, MD [facility] Office: [phone] Pager: [phone]

## 2013-03-07 NOTE — Interval H&P Note (Signed)
History and Physical Interval Note:  03/07/2013 7:32 AM  [name] [name]  has presented today for surgery, with the diagnosis of End Stage Renal Disease  The various methods of treatment have been discussed with the patient and family. After consideration of risks, benefits and other options for treatment, the patient has consented to  Procedure(s) with comments: ARTERIOVENOUS (AV) FISTULA CREATION (Left) - left brachiocephalic AVF as a surgical intervention .  The patient's history has been reviewed, patient examined, no change in status, stable for surgery.  I have reviewed the patient's chart and labs.  Questions were answered to the patient's satisfaction.     FIELDS,CHARLES E

## 2013-03-07 NOTE — Anesthesia Procedure Notes (Signed)
Procedure Name: MAC Date/Time: 03/07/2013 7:54 AM Performed by: Carmela Rima Pre-anesthesia Checklist: Patient identified, Timeout performed, Emergency Drugs available, Suction available and Patient being monitored Patient Re-evaluated:Patient Re-evaluated prior to inductionOxygen Delivery Method: Nasal cannula Placement Confirmation: positive ETCO2

## 2013-03-07 NOTE — Progress Notes (Signed)
Family Medicine Teaching Service Daily Progress Note Service Page: [phone]  Subjective:  Pt got AV fistula placed this morning. No complaints today. Wondering when she will get food. No abd pain.  Medications reviewed: No PRN in 24 hours.  Objective: BP 149/77  Pulse 70  Temp(Src) 99 F (37.2 C) (Oral)  Resp 18  Ht 5\' 5"  (1.651 m)  Wt 179 lb 14.3 oz (81.6 kg)  BMI 29.94 kg/m2  SpO2 93%   Exam:  General: NAD, lying in bed. talkative and appears comfortable Cardiovascular: RRR. No murmurs, rubs, or gallops though distant. Respiratory: CTAB. No wheezing appreciated. No increased work of breathing. HD Cath Right upper chest C/D/I  Abdomen: obese, soft, nondistended, NABS  Extremities: No LE edema; tenderness to palpation at ankles and shin but not calves (pt states this is chronic), no cord, no erythema or size difference, Left arm with 2 inch incision that is closed and non-erythematous with no exudate NEURO: Awake, alert, normal speech, no focal findings, hard of hearing SKIN: excoriations that are healing scabs on thighs and upper back - upper back excoriations with mild erythema and honey-colored crust PSYCH: Makes appropriate eye contact, appears a little down with affect congruent but smiles occasionally and appropriately.  Intake/Output Summary (Last 24 hours) at 03/07/13 0653 Last data filed at 03/06/13 1856  Gross per 24 hour  Intake    480 ml  Output      0 ml  Net    480 ml   Labs/Imaging -    Recent Labs Lab 03/04/13 0635 03/05/13 0948 03/07/13 0550  HGB 7.9* 8.1* 9.0*  HCT 24.4* 26.0* 28.4*  WBC 5.4 7.0 7.5  PLT 278 267 263    Recent Labs Lab 03/01/13 0655 03/02/13 0500 03/04/13 0635 03/05/13 0948 03/07/13 0550  NA 129* 125* 130* 132* 134*  K 4.4 4.6 4.2 3.7 4.3  CL 94* 91* 94* 96 97  CO2 27 26 28 29 28   GLUCOSE 125* 124* 115* 173* 136*  BUN 24* 30* 16 16 25*  CREATININE 4.50* 5.32* 3.88* 3.42* 4.31*  CALCIUM 8.5 8.7 8.6 8.8 9.2  PHOS 2.4  3.2 3.1 2.7  --    Upper extremity doppler - all appear compressible INR 4/22 0.98 c difficile 03/06/13 Neg  Assessment and Plan: [name] [name] is a 62 y.o. female with PMH of DM-2, muscle spasms, and emphysematous pylenephritis s/p right nephrectomy who presented with nausea/vomiting for approximately 2 weeks and acute kidney injury with associated metabolic acidosis and hyperkalemia. Nausea/vomiting have resolved but kidney injury remains despite adequate hydration. Had AV fistula placed left arm today.  # AKI - Unclear etiology - Suspect prerenal ATN with admission Cr ~8 from baseline <1, but prolonged course, now getting regular HD and AV fistula / graft left arm placed this morning. BP has been tenuous. Not on antihypertensive. Per renal US and GU, no signs of obstruction. Not improved with IV fluids or with continued HD. Do not want to biopsy as this is pt's solitary kidney. - Recovery less likely per renal, which patient is aware of - E coli UTI s/p treatment of CTX x 10 days., neg 4/10 culture - Renal following and we greatly appreciate further recommendations. Per renal recs.    - 4/5, 4/6, 4/8, 4/10, 4/14, 4/18, 4/19 (begin T-Th-Sat regimen per pt)    - ANCA, anti GBM, ANA  Negative, urine eosinophils pending  - Continue calcium carbonate for low calcium and calcitriol - Remain in house while waiting  improvement vs 6 weeks  - Permanent access placed left arm this AM - Dietary services seeing patient to help educate re: diabetic and renal diet  # Anemia - Hgb stable at ~8. Iron panel with sat 23%, ferritin 495, folate 8.4; IV Fe per renal, FOBT neg x 2  - IV iron, aranesp that renal is managing - Will monitor closely during admission. - Transfuse if persistent Hgb <7  # Hyperkalemia with mild T wave peaking on EKG- s/p kayexalate with normal Mg. Resolved.  # Itching - Severe itching, prn atarax, seems resolved. Could have been due to renal disease - Discontinue when possible' -  topical polysporin abx ointment to excoriation on back BID x 3 days (end PM 4/23)  # Atrial fibrillation with RVR - Brief episode 4/8 with vomiting, started diltiazem drip and pt came out of episode. With borderline low blood pressures, stopped diltiazem drip and pt stable with no further episodes. - NSR  # Metabolic acidosis - Thought to be likely from diarrhea. Corrected after IV fluids with bicarbonate - Now getting HD with tighter electrolyte control in dialysate    # Nausea/vomiting/diarrhea - Improved with HD and reglan; likely from azotemia and gastroparesis - GI consulted and found normal EGD and gastric emptying study with moderate slowing, signed off 4/11 - H pylori IgG positive - will hold off on treatment as no EGD findings; consider outpatient tx  - Zofran PRN, phenergan prn, protonix daily - Reglan tapered from TID to daily, eventual d/c. - Had been constipated so started miralax, d/c'ed with 2 d loose stools, but today again a hard BM with some watery - collect for c diff - Restart daily miralax and start colace per pt request due to hard stool  # DM-2 - A1c 6.1 - CBG's TID AC and QHS  - Sensitive SSI  - RD discussed diet with pt. Will encourage visits prn at patient's request. She is still confused about carb control and renal diet.  # Muscle spasms of back  - Tylenol PRN; will refrain from flexeril due to anticholinergic effects   # Hypertension - Stable over 24 hours, continue to monitor - Renal disease - Continue to monitor - Out of bed with assistance to avoid fall with hypotensive syncope   # Diverticulosis with no evidence of diverticulitis on CT  # CHD risk with DM Type II and HTN - continue low-dose aspirin  # Hard of hearing - Pt would benefit from hearing aid outpatient  # Psych - Pt is facing a major life transition with renal injury that is seeming more permanent to require long term hemodialysis.  - Consulted spiritual counselor and started Effexor  renally dosed, in place of [place] which pt wanted  # FEN/GI: SLIV; restart renal carb modified diet today - C diff negative - GI: miralax and colace PRN  # Prophylaxis:  - IS to bedside, encourage OOB with asst - started SQ heparin 4/13  # Disposition: Pending clinical improvement in AKI - PT- home health PT with rolling walker and 5" wheels; OT - 3-in-1 bedside commode.   - Consulted CM in hopes of earlier discharge with outpatient HD but this is not possible. - Renal feels pt will need long-term HD. Will re-consult CM and SW to discuss with pt insurance and cost issues - Will be important to f/u closely with PCP  # Code Status: Full code  Conni Slipper, MD 03/07/2013, 6:53 AM

## 2013-03-07 NOTE — Transfer of Care (Signed)
Immediate Anesthesia Transfer of Care Note  Patient: [name] [name]  Procedure(s) Performed: Procedure(s) with comments: ARTERIOVENOUS (AV) FISTULA CREATION (Left) - left brachiocephalic AVF  Patient Location: PACU  Anesthesia Type:MAC  Level of Consciousness: awake, alert  and oriented  Airway & Oxygen Therapy: Patient Spontanous Breathing and Patient connected to nasal cannula oxygen  Post-op Assessment: Report given to PACU RN, Post -op Vital signs reviewed and stable and Patient moving all extremities X 4  Post vital signs: Reviewed and stable  Complications: No apparent anesthesia complications

## 2013-03-07 NOTE — Progress Notes (Signed)
Hemodialysis- Pt has been accepted to [facility] on a MWF 1215 chair. Available to start as soon as Friday. Please Notify Dialysis department of impending discharge. Thank you.

## 2013-03-08 ENCOUNTER — Encounter (HOSPITAL_COMMUNITY): Payer: Self-pay | Admitting: Vascular Surgery

## 2013-03-08 LAB — GLUCOSE, CAPILLARY
Glucose-Capillary: 150 mg/dL — ABNORMAL HIGH (ref 70–99)
Glucose-Capillary: 163 mg/dL — ABNORMAL HIGH (ref 70–99)
Glucose-Capillary: 95 mg/dL (ref 70–99)

## 2013-03-08 MED ORDER — PANTOPRAZOLE SODIUM 40 MG PO TBEC: 40.0000 mg | DELAYED_RELEASE_TABLET | Freq: Every day | ORAL | Status: DC

## 2013-03-08 MED ORDER — DSS 100 MG PO CAPS: 100.0000 mg | ORAL_CAPSULE | Freq: Every day | ORAL | Status: DC | PRN

## 2013-03-08 MED ORDER — VENLAFAXINE HCL ER 37.5 MG PO CP24: 37.5000 mg | ORAL_CAPSULE | Freq: Every day | ORAL | Status: DC

## 2013-03-08 MED ORDER — ZINC OXIDE 12.8 % EX OINT: TOPICAL_OINTMENT | CUTANEOUS | Status: DC | PRN

## 2013-03-08 MED ORDER — RENA-VITE PO TABS: 1.0000 | ORAL_TABLET | Freq: Every day | ORAL | Status: DC

## 2013-03-08 MED ORDER — ACETAMINOPHEN 325 MG PO TABS: 650.0000 mg | ORAL_TABLET | Freq: Four times a day (QID) | ORAL | Status: DC | PRN

## 2013-03-08 MED ORDER — CALCIUM CARBONATE 1250 (500 CA) MG PO TABS: 1.0000 | ORAL_TABLET | Freq: Three times a day (TID) | ORAL | Status: DC

## 2013-03-08 MED ORDER — METOCLOPRAMIDE HCL 5 MG PO TABS: 5.0000 mg | ORAL_TABLET | Freq: Every day | ORAL | Status: DC

## 2013-03-08 MED ORDER — ASPIRIN 81 MG PO TBEC: 81.0000 mg | DELAYED_RELEASE_TABLET | Freq: Every day | ORAL | Status: DC

## 2013-03-08 MED ORDER — DOUBLE ANTIBIOTIC 500-10000 UNIT/GM EX OINT: 1.0000 "application " | TOPICAL_OINTMENT | Freq: Two times a day (BID) | CUTANEOUS | Status: DC

## 2013-03-08 MED ORDER — INSULIN GLARGINE 100 UNIT/ML ~~LOC~~ SOLN: 4.0000 [IU] | Freq: Every day | SUBCUTANEOUS | Status: DC

## 2013-03-08 MED ORDER — DARBEPOETIN ALFA-POLYSORBATE 100 MCG/0.5ML IJ SOLN: 100.0000 ug | INTRAMUSCULAR | Status: DC

## 2013-03-08 NOTE — Care Management Note (Signed)
   CARE MANAGEMENT NOTE 03/08/2013  Patient:  [name] [name], [name] [name]   Account Number:  [id number]  Date Initiated:  02/17/2013  Documentation initiated by:  Donn Pierini  Subjective/Objective Assessment:   Pt admitted with ARF     Action/Plan:   PTA pt lived at home with spouse  Met with pt and discussed insurnace   Anticipated DC Date:  03/08/2013   Anticipated DC Plan:  HOME W HOME HEALTH SERVICES      DC Planning Services  CM consult      Choice offered to / List presented to:             Status of service:  In process, will continue to follow Medicare Important Message given?   (If response is "NO", the following Medicare IM given date fields will be blank) Date Medicare IM given:   Date Additional Medicare IM given:    Discharge Disposition:    Per UR Regulation:  Reviewed for med. necessity/level of care/duration of stay  If discussed at Long Length of Stay Meetings, dates discussed:   02/28/2013    Comments:  03/04/2103  1200 Darlyne Russian RN, [place]  [phone] Continue to monitor renal status to determine acute vs chronic. 03/08/2013 Spoke with pt at length re Medicaid application and Insurance she previously applied for , scheduled to go into effect Mar 16, 2013. Will ask [facility] to provide [facility] services as this pt is self pay at this point. Medicaid is pending so outpatient hemodialysis has been arranged.  CSW assisting with transportation needs, pt able to arrange some transportation until [place] is in place. Johny Shock RN MPH, case manager, [phone]  02/24/2013 Noted that pt has not been determined to be Chronic Renal failure , however checked with Finance to see is application had been started as the notes state Medicaid Pending. Spoke with Rachael Darby of Finance who states that a Medicaid application was initiated on 02/21/2013.  Dr Eliott Nine informed, CM will continue to follow patient. Johny Shock RN MPH Case Manager [phone]    02/23/13- 1200- Donn Pierini RN, BSN  [phone] Received referral regarding pt's HD situation- pt is self pay- has no insurance- and as per renal note- "Cannot be discharged until clearly recovers or not (has no means of payment for outpt HD at present even if we declared her ESRD which we cannot do at this stage as she is "medicaid pending") So will have to remain in house for observation and prn HD until course declares itself"- spoke with resident regarding this and explained that pt has no other option but to remain in hospital   02/21/13- 1515- Donn Pierini RN, BSN [phone] Pt went into afib today now on cardizem gtt- plan for HD again today then possible removal of fem. cath to allow pt to get up- PT/OT still recommending HH at discharge- NCM to cont. to follow for d/c planning  02/17/13- 1125- Donn Pierini RN, BSN [phone] Plan today to insert fem vein HD cath  and begin HD tx- per PT evals recommendations at this time for [facility]- RNCM will follow for d/c needs/planning as pt progresses.

## 2013-03-08 NOTE — Progress Notes (Signed)
Physical Therapy Treatment Patient Details Name: [name] [name] MRN: [id number] DOB: 06-15-1951 Today's Date: 03/08/2013 Time: 4098-1191 PT Time Calculation (min): 12 min  PT Assessment / Plan / Recommendation Comments on Treatment Session  Pt demonstrating safety with mobility using AD.  Functionally pt is cleared for d/c to home when cleared by MD.   Acute PT will continue to follow pt to work on balance.      Follow Up Recommendations  Home health PT     Does the patient have the potential to tolerate intense rehabilitation     Barriers to Discharge        Equipment Recommendations  Rolling walker with 5" wheels    Recommendations for Other Services    Frequency Min 3X/week   Plan Discharge plan remains appropriate    Precautions / Restrictions Precautions Precautions: Fall Restrictions Weight Bearing Restrictions: No   Pertinent Vitals/Pain C/o Right knee pain when ambulating 4-6/10.  Notified RN.     Mobility  Bed Mobility Bed Mobility: Supine to Sit;Sit to Supine Supine to Sit: 7: Independent Sitting - Scoot to Edge of Bed: 7: Independent Sit to Supine: 7: Independent Transfers Transfers: Sit to Stand;Stand to Sit Sit to Stand: 7: Independent;From bed;From chair/3-in-1;With upper extremity assist Stand to Sit: 7: Independent;To bed;To chair/3-in-1;With upper extremity assist Details for Transfer Assistance: No cues or assist needed Ambulation/Gait Ambulation/Gait Assistance: 6: Modified independent (Device/Increase time) Ambulation Distance (Feet): 150 Feet Assistive device: Rolling walker Ambulation/Gait Assistance Details: Attempted to ambulate with no AD but pt had difficulty secondary to c/o R knee pain today.  Pt able to continue with RW and no difficulty.   Gait Pattern: Step-through pattern;Decreased step length - right;Decreased step length - left;Trunk flexed General Gait Details: Pt reporting R knee pain and feeling weak in R knee. No buckling or  antalgic gait noted.  Stairs: No Wheelchair Mobility Wheelchair Mobility: No    Exercises     PT Diagnosis:    PT Problem List:   PT Treatment Interventions:     PT Goals Acute Rehab PT Goals PT Goal Formulation: With patient Time For Goal Achievement: 03/15/13 Potential to Achieve Goals: Good Pt will go Sit to Stand: Independently PT Goal: Sit to Stand - Progress: Met Pt will go Stand to Sit: Independently PT Goal: Stand to Sit - Progress: Met Pt will Transfer Bed to Chair/Chair to Bed: with modified independence PT Transfer Goal: Bed to Chair/Chair to Bed - Progress: Met Pt will Ambulate: Independently;>150 feet PT Goal: Ambulate - Progress: Progressing toward goal  Visit Information  Last PT Received On: 03/08/13 Assistance Needed: +1    Subjective Data  Subjective: My Right knee hurts. I may be able to go home sooner than expected.   Patient Stated Goal: To go home and be independent.   Cognition  Cognition Arousal/Alertness: Awake/alert Behavior During Therapy: WFL for tasks assessed/performed Overall Cognitive Status: Within Functional Limits for tasks assessed General Comments: pt HOH so have to speak loudly    Balance     End of Session PT - End of Session Equipment Utilized During Treatment: Gait belt Activity Tolerance: Patient tolerated treatment well;Patient limited by fatigue Patient left: in chair;with call bell/phone within reach Nurse Communication: Mobility status   GP     Parsa Rickett 03/08/2013, 12:38 PM Jarmaine Ehrler L. Arick Mareno DPT [phone]

## 2013-03-08 NOTE — Progress Notes (Signed)
Subjective: Interval History: none.  Objective: Vital signs in last 24 hours: Temp:  [97.5 F (36.4 C)-99.3 F (37.4 C)] 97.5 F (36.4 C) (04/23 0845) Pulse Rate:  [55-83] 71 (04/23 0845) Resp:  [16-19] 16 (04/23 0845) BP: (76-173)/(37-82) 110/72 mmHg (04/23 0845) SpO2:  [94 %-98 %] 98 % (04/23 0845) Weight:  [79.799 kg (175 lb 14.8 oz)-79.8 kg (175 lb 14.8 oz)] 79.799 kg (175 lb 14.8 oz) (04/22 2052) Weight change: -1.8 kg (-3 lb 15.5 oz)  Intake/Output from previous day: 04/22 0701 - 04/23 0700 In: 1163 [P.O.:838; I.V.:325] Out: 2200  Intake/Output this shift: Total I/O In: 120 [P.O.:120] Out: -   General appearance: alert, cooperative, mildly obese and pale Resp: clear to auscultation bilaterally Cardio: S1, S2 normal and systolic murmur: holosystolic 2/6, blowing at apex GI: soft,liver down 4 cm, pos bs Extremities: edema 1+ and AVF LUA B&T  Lab Results:  Recent Labs  03/07/13 0550 03/07/13 1300  WBC 7.5 7.4  HGB 9.0* 8.9*  HCT 28.4* 28.0*  PLT 263 275   BMET:  Recent Labs  03/07/13 0550 03/07/13 1300  NA 134* 134*  K 4.3 3.9  CL 97 97  CO2 28 27  GLUCOSE 136* 156*  BUN 25* 29*  CREATININE 4.31* 4.40*  CALCIUM 9.2 8.7   No results found for this basename: PTH,  in the last 72 hours Iron Studies:  Recent Labs  03/07/13 1300  IRON 70  TIBC 182*    Studies/Results: No results found.  I have reviewed the patient's current medications.  Assessment/Plan: 1 CRF for HD MWF @ [facility]. Will plan next HD on Fri either here or [facility] 2 anemia epo/fe 3 DM controlled 4 HPTH vit D 5 Pyelo resolved P HD on Fri, epo, fe    LOS: 23 days   Alain Deschene L 03/08/2013,10:30 AM

## 2013-03-08 NOTE — Progress Notes (Signed)
[facility] [place]  Subjective  - Doing well over all.  She has incisional pain and reports no signs of steal.   Objective 131/78 73 99.3 F (37.4 C) (Oral) 16 98%  Intake/Output Summary (Last 24 hours) at 03/08/13 0807 Last data filed at 03/08/13 0537  Gross per 24 hour  Intake   1163 ml  Output   2200 ml  Net  -1037 ml    Left anti cubital incision without erythema or edema. Distally left hand N/V/M intact and equal bilaterally.  Assessment/Planning: POD #1  Brachial Cephalic  AV fistula creation left upper arm. The fistula will be ready for use in 12 weeks. F/u with Dr. Darrick Penna in 4-6 weeks.  Clinton Gallant [facility] 03/08/2013 8:07 AM --  Laboratory Lab Results:  Recent Labs  03/07/13 0550 03/07/13 1300  WBC 7.5 7.4  HGB 9.0* 8.9*  HCT 28.4* 28.0*  PLT 263 275   BMET  Recent Labs  03/07/13 0550 03/07/13 1300  NA 134* 134*  K 4.3 3.9  CL 97 97  CO2 28 27  GLUCOSE 136* 156*  BUN 25* 29*  CREATININE 4.31* 4.40*  CALCIUM 9.2 8.7    COAG Lab Results  Component Value Date   INR 0.98 03/07/2013   INR 1.21 02/13/2013   INR 1.20 03/10/2011   No results found for this basename: PTT

## 2013-03-08 NOTE — Progress Notes (Signed)
Patient ID: [name] [name], female   DOB: January 30, 1951, 62 y.o.   MRN: [id number] Family Medicine Teaching Service Daily Progress Note Service Page: [phone]  Subjective:  Pt got AV fistula placed this yesterday and is healing well. No complaints today. Tolerating meals and ready to go home.   Medications reviewed: No PRN in 24 hours.  Objective: BP 110/72  Pulse 71  Temp(Src) 97.5 F (36.4 C) (Oral)  Resp 16  Ht 5\' 5"  (1.651 m)  Wt 175 lb 14.8 oz (79.799 kg)  BMI 29.28 kg/m2  SpO2 98%   Exam:  General: NAD, sitting up in bedside chair in the sun. HOH. Cardiovascular: RRR.1/6 SM no gallops or rubs, though distant. Respiratory: CTAB. No wheezing appreciated. No increased work of breathing. HD Cath Right upper chest C/D/I  Abdomen: obese, soft, nondistended, NABS  Extremities: No LE edema, erythema or tenderness today. Left arm with 2 inch incision that is closed and non-erythematous with no exudate. Healing well.  NEURO: Awake, alert, normal speech, no focal findings, hard of hearing; hears best from the right ear. SKIN: excoriations that are healing scabs on thighs and upper back - upper back excoriations with mild erythema and honey-colored crust PSYCH: Makes appropriate eye contact, appears a little down with affect congruent but smiles occasionally and appropriately.  Intake/Output Summary (Last 24 hours) at 03/08/13 1418 Last data filed at 03/08/13 1313  Gross per 24 hour  Intake    840 ml  Output   2200 ml  Net  -1360 ml    Recent Labs Lab 03/05/13 0948 03/07/13 0550 03/07/13 1300  HGB 8.1* 9.0* 8.9*  HCT 26.0* 28.4* 28.0*  WBC 7.0 7.5 7.4  PLT 267 263 275    Recent Labs Lab 03/02/13 0500 03/04/13 0635 03/05/13 0948 03/07/13 0550 03/07/13 1300  NA 125* 130* 132* 134* 134*  K 4.6 4.2 3.7 4.3 3.9  CL 91* 94* 96 97 97  CO2 26 28 29 28 27   GLUCOSE 124* 115* 173* 136* 156*  BUN 30* 16 16 25* 29*  CREATININE 5.32* 3.88* 3.42* 4.31* 4.40*  CALCIUM 8.7 8.6  8.8 9.2 8.7  PHOS 3.2 3.1 2.7  --  3.3   Upper extremity doppler - all appear compressible INR 4/22 0.98 c difficile 03/06/13 Neg  Assessment and Plan: [name] [name] is a 62 y.o. female with PMH of DM-2, muscle spasms, and emphysematous pylenephritis s/p right nephrectomy who presented with nausea/vomiting for approximately 2 weeks and acute kidney injury with associated metabolic acidosis and hyperkalemia. Nausea/vomiting have resolved but kidney injury remains despite adequate hydration. Had AV fistula placed left arm yesterday and is tolerating well.   # AKI - Unclear etiology - Suspect prerenal ATN with admission Cr ~8 from baseline <1, but prolonged course, now getting regular HD and AV fistula / graft left arm placed. BP has been tenuous. Not on antihypertensive. Per renal US and GU, no signs of obstruction. Not improved with IV fluids or with continued HD. Do not want to biopsy as this is pt's solitary kidney. - Recovery less likely per renal, which patient is aware of chronic condition - E coli UTI s/p treatment of CTX x 10 days., neg 4/10 culture - Renal following and we greatly appreciate further recommendations. Per renal recs.    - 4/5, 4/6, 4/8, 4/10, 4/14, 4/18, 4/19 (begin T-Th-Sat regimen per pt)--> has outpatient schedule for Friday    - ANCA, anti GBM, ANA  Negative, urine eosinophils pending  - Continue  calcium carbonate for low calcium and calcitriol - Remain in house while waiting improvement vs 6 weeks  - Permanent access placed left arm  - Dietary services seeing patient to help educate re: diabetic and renal diet  # Anemia - Hgb stable at ~8. Iron panel with sat 23%, ferritin 495, folate 8.4; IV Fe per renal, FOBT neg x 2  - IV iron, aranesp that renal is managing - Will monitor closely during admission. - Transfuse if persistent Hgb <7  # Hyperkalemia with mild T wave peaking on EKG- s/p kayexalate with normal Mg. Resolved.  # Itching - Severe itching, prn atarax,  seems resolved. Could have been due to renal disease - Discontinue when possible' - topical polysporin abx ointment to excoriation on back BID x 3 days (end PM 4/23)  # Atrial fibrillation with RVR - Brief episode 4/8 with vomiting, started diltiazem drip and pt came out of episode. With borderline low blood pressures, stopped diltiazem drip and pt stable with no further episodes. - NSR  # Metabolic acidosis - Thought to be likely from diarrhea. Corrected after IV fluids with bicarbonate - Now getting HD with tighter electrolyte control in dialysate    # Nausea/vomiting/diarrhea - Improved with HD and reglan; likely from azotemia and gastroparesis - GI consulted and found normal EGD and gastric emptying study with moderate slowing, signed off 4/11 - H pylori IgG positive - will hold off on treatment as no EGD findings; consider outpatient tx  - Zofran PRN, phenergan prn, protonix daily - Reglan tapered from TID to daily, eventual d/c. - Had been constipated so started miralax, d/c'ed with 2 d loose stools, but today again a hard BM with some watery - collect for c diff - Restart daily miralax and start colace per pt request due to hard stool  # DM-2 - A1c 6.1 - CBG's TID AC and QHS  - Sensitive SSI  - RD discussed diet with pt. Will encourage visits prn at patient's request. She is still confused about carb control and renal diet.  # Muscle spasms of back  - Tylenol PRN; will refrain from flexeril due to anticholinergic effects   # Hypertension - Stable over 24 hours, continue to monitor - Renal disease - Continue to monitor - Out of bed with assistance to avoid fall with hypotensive syncope   # Diverticulosis with no evidence of diverticulitis on CT  # CHD risk with DM Type II and HTN - continue low-dose aspirin  # Hard of hearing - Pt would benefit from hearing aid outpatient  # Psych - Pt is facing a major life transition with renal injury that is seeming more permanent to  require long term hemodialysis.  - Consulted spiritual counselor and started Effexor renally dosed, in place of [place] Wort which pt wanted  # FEN/GI: SLIV; restart renal carb modified diet today - C diff negative - GI: miralax and colace PRN  # Prophylaxis:  - IS to bedside, encourage OOB with asst - started SQ heparin 4/13  # Disposition: Pending clinical improvement in AKI - PT- home health PT with rolling walker and 5" wheels; OT - 3-in-1 bedside commode.   - Consulted CM in hopes of earlier discharge with outpatient HD but this is not possible. - Renal feels pt will need long-term HD. Will re-consult CM and SW to discuss with pt insurance and cost issues - Will be important to f/u closely with PCP - Probable discharge today, renal approved.  #  Code Status: Full code  Howard Pouch, DO 03/08/2013, 2:18 PM

## 2013-03-09 NOTE — Progress Notes (Signed)
I discussed with Dr Kuneff.  I agree with their plans documented in their progress note for today.  

## 2013-03-09 NOTE — Discharge Summary (Signed)
I discussed with Dr Thekkekandam.  I agree with their plans documented in their progress note for today.  

## 2013-03-10 ENCOUNTER — Telehealth: Payer: Self-pay | Admitting: *Deleted

## 2013-03-10 NOTE — Telephone Encounter (Signed)
[name] [name], renal PA called to say the AVF created 03/07/13 is clotted. Pt has a catheter for dialysis. He wants to know next step;declot or new access. I told him I would contact Dr Darrick Penna Monday and discuss with him. He said to call back to the kidney center.

## 2013-03-13 ENCOUNTER — Telehealth: Payer: Self-pay | Admitting: Vascular Surgery

## 2013-03-13 NOTE — Telephone Encounter (Signed)
lvm for pt to cb to confirm appt - kf

## 2013-03-13 NOTE — Telephone Encounter (Signed)
Per Dr Darrick Penna she is set up for an office visit 03/16/13.

## 2013-03-13 NOTE — Telephone Encounter (Signed)
Message copied by Margaretmary Eddy on Mon Mar 13, 2013  3:40 PM ------      Message from: Melene Plan      Created: Mon Mar 13, 2013  9:22 AM                   ----- Message -----         From: Sherren Kerns, MD         Sent: 03/13/2013   7:37 AM           To: Melene Plan, RN            Office visit      ----- Message -----         From: Melene Plan, RN         Sent: 03/10/2013   4:40 PM           To: Sherren Kerns, MD            Onalee Hua the renal PA called today and said her fistula is clotted today. You created it 03/07/13. She has a cath. Do I need to set up an office visit?jjk       ------

## 2013-03-15 ENCOUNTER — Encounter: Payer: Self-pay | Admitting: Vascular Surgery

## 2013-03-15 ENCOUNTER — Inpatient Hospital Stay: Payer: Self-pay | Admitting: Family Medicine

## 2013-03-16 ENCOUNTER — Encounter: Payer: Self-pay | Admitting: Family Medicine

## 2013-03-16 ENCOUNTER — Ambulatory Visit (INDEPENDENT_AMBULATORY_CARE_PROVIDER_SITE_OTHER): Payer: Self-pay | Admitting: Family Medicine

## 2013-03-16 ENCOUNTER — Ambulatory Visit (INDEPENDENT_AMBULATORY_CARE_PROVIDER_SITE_OTHER): Payer: Self-pay | Admitting: Vascular Surgery

## 2013-03-16 ENCOUNTER — Encounter: Payer: Self-pay | Admitting: Vascular Surgery

## 2013-03-16 VITALS — BP 133/68 | HR 71 | Resp 14 | Ht 64.0 in | Wt 177.0 lb

## 2013-03-16 VITALS — BP 140/50 | HR 75 | Temp 98.1°F | Ht 64.0 in | Wt 177.0 lb

## 2013-03-16 DIAGNOSIS — N179 Acute kidney failure, unspecified: Secondary | ICD-10-CM

## 2013-03-16 DIAGNOSIS — F329 Major depressive disorder, single episode, unspecified: Secondary | ICD-10-CM

## 2013-03-16 DIAGNOSIS — R112 Nausea with vomiting, unspecified: Secondary | ICD-10-CM

## 2013-03-16 DIAGNOSIS — N186 End stage renal disease: Secondary | ICD-10-CM

## 2013-03-16 DIAGNOSIS — Z48812 Encounter for surgical aftercare following surgery on the circulatory system: Secondary | ICD-10-CM

## 2013-03-16 MED ORDER — VENLAFAXINE HCL ER 75 MG PO CP24: 75.0000 mg | ORAL_CAPSULE | Freq: Every day | ORAL | Status: DC

## 2013-03-16 NOTE — Progress Notes (Signed)
  Subjective:    Patient ID: [name] [name], female    DOB: [birth year], 62 y.o.   MRN: [id number]  HPI # Hospital follow-up renal failure, nausea/vomiting  Admitted 03/31-04/23 She is now on dialysis.   She denies nausea/vomiting and is having regular bowel movements.   Hospital follow-up issues: "- Eventual discontinuation of reglan to prevent tardive dyskinesia  - Attempt to get patient hearing aid--got a "cheap" one which amplifies but helps - F/u on mood with initiation of Effexor renally dosed; f/u on hoarding"  # Depression/adjustment disorder She was started on Effexor during hospitalization She still is waking up at nighttime and having trouble falling asleep.   Review of Systems Denies fevers, chills, dysuria  Allergies, medication, past medical history reviewed.  Smoking status noted.  Significant for: T2DM--on 5 unit insulin; around 150 fasting  Emphysematous pyelo in pat  Hyperkalemia in hospital  Low blood pressures--has been an issue at dialysis center; had been SBP 70s. Endorses occasional lightheadedness.  Night muscle spasms     Objective:   Physical Exam GEN: NAD PSYCH: pleasant; mildly depressed appearing but appears more positive than she did in the hospital CV: RRR PULM: NI WOB ABD: soft, NT, ND SKIN: LEFT ARM fistula: appears to be healing well, no erythema, tenderness, warmth      Assessment & Plan:  Stop metoclopramide (Reglan). If your nausea comes back, let me know.   Take Effexor 2 capsules a day.   Call and schedule an appointment with Dr. Darrick Penna (kidney doctor). [facility]  [phone]  Tell them that Dr. Darrick Penna saw you in the hospital, started you on dialysis, and you want to make an appointment to follow-up.   Follow-up in 2 weeks.

## 2013-03-16 NOTE — Progress Notes (Signed)
Patient is a 62 year old female who is a few weeks status post placement of a left brachiocephalic AV fistula. She was referred to the office from [facility] due to concerns about clot in her fistula. The patient denies any numbness or tingling in her left hand. She is currently dialyzing via a right-sided catheter.  Physical exam:  Filed Vitals:   03/16/13 1321  BP: 133/68  Pulse: 71  Resp: 14  Height: 5\' 4"  (1.626 m)  Weight: 177 lb (80.287 kg)  SpO2: 98%   Left upper extremity: Left hand pink warm brisk capillary refill audible bruit palpable thrill left upper arm AV fistula with healing antecubital incision  Assessment: Patent left arm AV fistula  Plan: Followup one month  Fabienne Bruns, MD [facility] [place] Office: [phone] Pager: [phone]

## 2013-03-16 NOTE — Patient Instructions (Addendum)
Stop metoclopramide (Reglan). If your nausea comes back, let me know.   Take Effexor 2 capsules a day.   Call and schedule an appointment with Dr. Darrick Penna (kidney doctor). [facility]  [phone]  Tell them that Dr. Darrick Penna saw you in the hospital, started you on dialysis, and you want to make an appointment to follow-up.   Follow-up in 2 weeks.

## 2013-03-20 NOTE — Assessment & Plan Note (Signed)
She did not make follow-up appointment to discuss potential other etiology for kidney injury besides dehydration from nausea/vomiting. She was given number and advised to make hospital follow-up appointment per renal recommendations.

## 2013-03-20 NOTE — Assessment & Plan Note (Signed)
Increase Effexor. Follow-up in 2 weeks.

## 2013-03-20 NOTE — Assessment & Plan Note (Signed)
Resolved. Stop Reglan.

## 2013-04-06 ENCOUNTER — Encounter: Payer: Self-pay | Admitting: Family Medicine

## 2013-04-06 ENCOUNTER — Ambulatory Visit (INDEPENDENT_AMBULATORY_CARE_PROVIDER_SITE_OTHER): Payer: BC Managed Care – PPO | Admitting: Family Medicine

## 2013-04-06 VITALS — BP 133/83 | HR 79 | Temp 97.9°F | Ht 64.0 in | Wt 175.0 lb

## 2013-04-06 DIAGNOSIS — I959 Hypotension, unspecified: Secondary | ICD-10-CM

## 2013-04-06 DIAGNOSIS — F3289 Other specified depressive episodes: Secondary | ICD-10-CM

## 2013-04-06 DIAGNOSIS — R112 Nausea with vomiting, unspecified: Secondary | ICD-10-CM

## 2013-04-06 DIAGNOSIS — F329 Major depressive disorder, single episode, unspecified: Secondary | ICD-10-CM

## 2013-04-06 DIAGNOSIS — N186 End stage renal disease: Secondary | ICD-10-CM

## 2013-04-06 MED ORDER — VENLAFAXINE HCL ER 75 MG PO CP24: 75.0000 mg | ORAL_CAPSULE | Freq: Every day | ORAL | Status: DC

## 2013-04-06 MED ORDER — PANTOPRAZOLE SODIUM 40 MG PO TBEC: 40.0000 mg | DELAYED_RELEASE_TABLET | Freq: Every day | ORAL | Status: DC

## 2013-04-06 NOTE — Assessment & Plan Note (Signed)
Controlled with prn anti-emetics Continue to hold Reglan

## 2013-04-06 NOTE — Assessment & Plan Note (Signed)
Improved. Continue Effexor 75 mg daily; will avoid going up higher than this due to ESRD (which recommends 50% of normal dose).

## 2013-04-06 NOTE — Assessment & Plan Note (Signed)
Controlled with prn laxative.

## 2013-04-06 NOTE — Assessment & Plan Note (Signed)
Documentation only. At dialysis but WNL today

## 2013-04-06 NOTE — Patient Instructions (Addendum)
Follow-up next month or sooner if your morning sugars are consistently > 200  Please bring a list of your sugar numbers to your next visit

## 2013-04-06 NOTE — Progress Notes (Signed)
  Subjective:    Patient ID: [name] [name], female    DOB: [birth year], 62 y.o.   MRN: [id number]  HPI # She threw-up Tuesday and took one of her nausea pills Otherwise, her nausea has been well controlled  # ESRD She called Dr. Detereding's office and he see her at dialysis  # Depression 75 mg daily of Effexor She has not noticed significant difference after going up on Effexor  Review of Systems Denies chest pain, nausea, abdominal pain, constipation     Objective:   Physical Exam GEN: NAD; well-appearing PSYCH: mildly depressed appearing (at least baseline, but I think she looks more cheerful than usual); alert and oriented CV: distant HS, RRR PULM: NI WOB; CTAB without w/r/r     Assessment & Plan:

## 2013-04-11 NOTE — Assessment & Plan Note (Signed)
Documentation only. Sees Dr. Darrick Penna at dialysis center.

## 2013-04-13 ENCOUNTER — Other Ambulatory Visit: Payer: Self-pay | Admitting: *Deleted

## 2013-04-13 DIAGNOSIS — Z4931 Encounter for adequacy testing for hemodialysis: Secondary | ICD-10-CM

## 2013-04-13 DIAGNOSIS — N186 End stage renal disease: Secondary | ICD-10-CM

## 2013-04-19 ENCOUNTER — Encounter: Payer: Self-pay | Admitting: Vascular Surgery

## 2013-04-20 ENCOUNTER — Encounter (INDEPENDENT_AMBULATORY_CARE_PROVIDER_SITE_OTHER): Payer: BC Managed Care – PPO | Admitting: *Deleted

## 2013-04-20 ENCOUNTER — Encounter: Payer: Self-pay | Admitting: Vascular Surgery

## 2013-04-20 ENCOUNTER — Ambulatory Visit (INDEPENDENT_AMBULATORY_CARE_PROVIDER_SITE_OTHER): Payer: BC Managed Care – PPO | Admitting: Vascular Surgery

## 2013-04-20 VITALS — BP 145/71 | HR 73 | Resp 14 | Ht 65.0 in | Wt 178.0 lb

## 2013-04-20 DIAGNOSIS — N186 End stage renal disease: Secondary | ICD-10-CM

## 2013-04-20 DIAGNOSIS — Z48812 Encounter for surgical aftercare following surgery on the circulatory system: Secondary | ICD-10-CM

## 2013-04-20 DIAGNOSIS — T82598A Other mechanical complication of other cardiac and vascular devices and implants, initial encounter: Secondary | ICD-10-CM

## 2013-04-20 DIAGNOSIS — Z4931 Encounter for adequacy testing for hemodialysis: Secondary | ICD-10-CM

## 2013-04-20 NOTE — Progress Notes (Signed)
Patient is a 62 year old female status post left brachiocephalic AV fistula on April 22. She returns today for followup. She is currently on dialysis via a right-sided catheter. She denies any numbness or tingling in her left hand.  Filed Vitals:   04/20/13 1225  BP: 145/71  Pulse: 73  Resp: 14  Height: 5\' 5"  (1.651 m)  Weight: 178 lb (80.74 kg)  SpO2: 98%   Left upper extremity: Well-healed antecubital incision palpable thrill above the fistula the fistula is palpable throughout most of its course  Data: The patient had a duplex of her AV fistula today which shows that most of the fistula is less than 6 mm from the skin and greater than 6 mm in diameter. I reviewed and interpreted this study.  Assessment: Maturing left brachiocephalic AV fistula  Plan: Followup as needed if difficulty cannulating fistula. It should be ready for use in approximately 6 weeks.  Fabienne Bruns, MD [facility] [place] Office: [phone] Pager: [phone]

## 2013-04-24 ENCOUNTER — Other Ambulatory Visit: Payer: Self-pay | Admitting: *Deleted

## 2013-04-24 MED ORDER — INSULIN GLARGINE 100 UNIT/ML ~~LOC~~ SOLN: 5.0000 [IU] | Freq: Every day | SUBCUTANEOUS | Status: DC

## 2013-04-24 MED ORDER — VENLAFAXINE HCL ER 75 MG PO CP24: 75.0000 mg | ORAL_CAPSULE | Freq: Every day | ORAL | Status: DC

## 2013-04-27 ENCOUNTER — Ambulatory Visit (INDEPENDENT_AMBULATORY_CARE_PROVIDER_SITE_OTHER): Payer: BC Managed Care – PPO | Admitting: Family Medicine

## 2013-04-27 VITALS — BP 159/82 | HR 72 | Temp 97.5°F | Wt 177.9 lb

## 2013-04-27 DIAGNOSIS — R112 Nausea with vomiting, unspecified: Secondary | ICD-10-CM

## 2013-04-27 DIAGNOSIS — F3289 Other specified depressive episodes: Secondary | ICD-10-CM

## 2013-04-27 DIAGNOSIS — F329 Major depressive disorder, single episode, unspecified: Secondary | ICD-10-CM

## 2013-04-27 DIAGNOSIS — I959 Hypotension, unspecified: Secondary | ICD-10-CM

## 2013-04-27 DIAGNOSIS — E119 Type 2 diabetes mellitus without complications: Secondary | ICD-10-CM

## 2013-04-27 NOTE — Progress Notes (Signed)
  Subjective:    Patient ID: [name] [name], female    DOB: [birth year], 62 y.o.   MRN: [id number]  HPI # DM Nephrologist went up on Lantus from 5 to 8 recently due to elevated sugars at dialysis Home BS past 2 weeks: usually 100-150s; 1 low in 90; no highs  # Anxiety, decreased appetite sometimes, difficulty sleeping sometimes She goes to bed around midnight and sometimes cannot fall asleep until 2-3 am or she wakes up and cannot fall back asleep She reads or watches television in living room at nighttime She does not have TV in bedroom She sleeps with one light on her husband's side She sometimes stays awake worrying about how long she can be on dialysis before having problems  Review of Systems  Allergies, medication, past medical history reviewed.  Smoking status noted.     Objective:   Physical Exam GEN: NAD PSYCH: anxious appearing (baseline); PHQ-9 12 CV: RRR PULM: NI WOB GAIT: uses a 1-pronged cane    Assessment & Plan:

## 2013-04-27 NOTE — Assessment & Plan Note (Signed)
Documentation only. She was recently stated on Promethazine prn.

## 2013-04-27 NOTE — Assessment & Plan Note (Signed)
Nephrologist recently increased from 5 to 8 units Lantus. BS well controlled.

## 2013-04-27 NOTE — Assessment & Plan Note (Signed)
Blood pressure fine today.

## 2013-04-27 NOTE — Patient Instructions (Addendum)
Try to avoid watching television at nighttime Try to avoid naps   If you take a nap and cannot sleep that night, do something restful like reading   Follow-up in 3 months for your diabetes and mood   Call in 2 months and and schedule an appointment; they will tell you who your doctor is at that time

## 2013-04-27 NOTE — Assessment & Plan Note (Signed)
Continue Effexor 75. Patient thinks it is working well for her.  She declines starting another anti-depressant.  Discussed sleep hygiene techniques.  Follow-up if anxiety worsens, sleep worsens; otherwise 3 months

## 2013-05-25 ENCOUNTER — Other Ambulatory Visit: Payer: Self-pay

## 2013-06-06 ENCOUNTER — Other Ambulatory Visit (HOSPITAL_COMMUNITY): Payer: Self-pay | Admitting: Family Medicine

## 2013-07-01 ENCOUNTER — Other Ambulatory Visit (HOSPITAL_COMMUNITY): Payer: Self-pay | Admitting: Family Medicine

## 2013-07-03 ENCOUNTER — Other Ambulatory Visit: Payer: Self-pay | Admitting: *Deleted

## 2013-07-03 MED ORDER — PANTOPRAZOLE SODIUM 40 MG PO TBEC: 40.0000 mg | DELAYED_RELEASE_TABLET | Freq: Every day | ORAL | Status: DC

## 2013-07-15 ENCOUNTER — Other Ambulatory Visit (HOSPITAL_COMMUNITY): Payer: Self-pay | Admitting: Family Medicine

## 2013-07-20 ENCOUNTER — Ambulatory Visit (INDEPENDENT_AMBULATORY_CARE_PROVIDER_SITE_OTHER): Payer: BC Managed Care – PPO | Admitting: Family Medicine

## 2013-07-20 VITALS — BP 118/69 | HR 75 | Temp 98.1°F | Wt 182.0 lb

## 2013-07-20 DIAGNOSIS — E119 Type 2 diabetes mellitus without complications: Secondary | ICD-10-CM

## 2013-07-20 DIAGNOSIS — N186 End stage renal disease: Secondary | ICD-10-CM

## 2013-07-20 LAB — POCT GLYCOSYLATED HEMOGLOBIN (HGB A1C): Hemoglobin A1C: 5.7

## 2013-07-20 NOTE — Progress Notes (Signed)
Patient ID: [name] [name], female   DOB: 1951-01-15, 62 y.o.   MRN: [id number]  [facility] Zade Falkner M. Adelayde Minney, MD Phone: [phone]   Subjective: HPI: Patient is a 62 y.o. female presenting to clinic today for follow up appointment.  1. Diabetes: Typically runs 125-130's High at home: 160 Low at home: No symptomatic lows Taking medications: Lantus 8 units Side effects: None ROS: denies fever, chills, dizziness, LOC, polyuria, polydipsia, numbness or tingling in extremities or chest pain. Last eye exam: 2 months ago (saw one spot of retinopathy) - At [facility] Last foot exam: 2 months ago at the dialysis clinic. Nephropathy screen indicated?: No, on HD  2. Renal - Goes to HD on [facility]. MWF schedule. Labs have been stable, other than low protein. Nephrologist sees her every Wednesday.   History Reviewed: Never smoker. Health Maintenance: Appears to be behind on mammogram (2002 but currently has port-a-cath in right chest), colonoscopy (long time ago), pneumovax and Tdap. She states she last had Tdap at dialysis. Will get flu shot this fall.   ROS: Please see HPI above.  Objective: Office vital signs reviewed. BP 118/69  Pulse 75  Temp(Src) 98.1 F (36.7 C) (Oral)  Wt 182 lb (82.555 kg)  BMI 30.29 kg/m2  Physical Examination:  General: Awake, alert. NAD.  HEENT: Atraumatic, normocephalic. Dental caries. MMM Pulm: CTAB, no wheezes Cardio: RRR, no murmurs appreciated. Port a cath right upper chest Extremities: Trace edema lower extremities. Palpable thrill over left antecubital fossa. Large bruise proximally due to recent revision of AV fistula.  Neuro: Grossly intact  Assessment: 63 y.o. female follow up and meet new doctor  Plan: See Problem List and After Visit Summary

## 2013-07-20 NOTE — Patient Instructions (Addendum)
It was nice to meet you today.  Your blood sugar is perfect today! Your A1C is 5.7 today. Congratulations!  The only recommendation I will make is for you to get a mammogram after your port is out.   I will see you in 3 months, or sooner if you need anything. Please bring your labs from the dialysis center with you to that visit.  Xan Sparkman M. Regene Mccarthy, M.D.

## 2013-07-20 NOTE — Assessment & Plan Note (Signed)
A1C 5.7. On Lantus 8 units and HD. Stable at this time. Continue to monitor, but no changes today.

## 2013-07-20 NOTE — Assessment & Plan Note (Signed)
HD on MWF. Monitored closely by renal. No concerns today. Will ask her to bring labs with her, or recheck in December if she does not have labs with her.

## 2013-07-25 ENCOUNTER — Other Ambulatory Visit (HOSPITAL_COMMUNITY): Payer: Self-pay | Admitting: Family Medicine

## 2013-07-26 ENCOUNTER — Other Ambulatory Visit: Payer: Self-pay | Admitting: Family Medicine

## 2013-07-26 MED ORDER — RENA-VITE PO TABS: 1.0000 | ORAL_TABLET | Freq: Every day | ORAL | Status: DC

## 2013-08-05 ENCOUNTER — Other Ambulatory Visit (HOSPITAL_COMMUNITY): Payer: Self-pay | Admitting: Family Medicine

## 2013-08-07 ENCOUNTER — Other Ambulatory Visit: Payer: Self-pay | Admitting: Family Medicine

## 2013-08-07 ENCOUNTER — Other Ambulatory Visit (HOSPITAL_COMMUNITY): Payer: Self-pay | Admitting: Family Medicine

## 2013-09-19 ENCOUNTER — Other Ambulatory Visit (HOSPITAL_COMMUNITY): Payer: Self-pay | Admitting: Nephrology

## 2013-09-19 DIAGNOSIS — N186 End stage renal disease: Secondary | ICD-10-CM

## 2013-09-21 ENCOUNTER — Ambulatory Visit (HOSPITAL_COMMUNITY)
Admission: RE | Admit: 2013-09-21 | Discharge: 2013-09-21 | Disposition: A | Discharge status: Home / Self Care | Payer: Medicare Other | Source: Ambulatory Visit | Attending: Nephrology | Admitting: Nephrology

## 2013-09-21 DIAGNOSIS — N186 End stage renal disease: Secondary | ICD-10-CM

## 2013-09-21 DIAGNOSIS — Z992 Dependence on renal dialysis: Secondary | ICD-10-CM | POA: Insufficient documentation

## 2013-09-21 DIAGNOSIS — Z452 Encounter for adjustment and management of vascular access device: Secondary | ICD-10-CM | POA: Insufficient documentation

## 2013-09-21 LAB — GLUCOSE, CAPILLARY
Glucose-Capillary: 126 mg/dL — ABNORMAL HIGH (ref 70–99)
Glucose-Capillary: 56 mg/dL — ABNORMAL LOW (ref 70–99)
Glucose-Capillary: 126 mg/dL — ABNORMAL HIGH (ref 70–99)

## 2013-09-21 MED ORDER — DEXTROSE 50 % IV SOLN
25.0000 mL | Freq: Once | INTRAVENOUS | Status: AC
  Administered 2013-09-21: 25 mL via INTRAVENOUS

## 2013-09-21 MED ORDER — CHLORHEXIDINE GLUCONATE 4 % EX LIQD
CUTANEOUS | Status: AC
  Filled 2013-09-21: qty 15

## 2013-09-21 MED ORDER — DEXTROSE 50 % IV SOLN
INTRAVENOUS | Status: AC
  Administered 2013-09-21: 25 mL
  Filled 2013-09-21: qty 50

## 2013-09-21 MED ORDER — GLUCOSE 40 % PO GEL
ORAL | Status: AC
  Administered 2013-09-21: 1
  Filled 2013-09-21: qty 1

## 2013-09-21 NOTE — Procedures (Signed)
Procedure:  Removal of tunneled HD cath Findings:  Symptomatic hypoglycemia treated with D50 IV prior to removal.  Right tunneled HD cath successfully removed in entirety.

## 2013-09-21 NOTE — Progress Notes (Signed)
Pt more alert repeat BS 126

## 2013-09-21 NOTE — Progress Notes (Signed)
Called to IR c/o pt diaphoretic BS done 27, given glucose gel per order and policy

## 2013-11-08 ENCOUNTER — Other Ambulatory Visit (HOSPITAL_COMMUNITY): Payer: Self-pay | Admitting: Family Medicine

## 2013-11-30 ENCOUNTER — Other Ambulatory Visit (HOSPITAL_COMMUNITY): Payer: Self-pay | Admitting: Family Medicine

## 2013-12-10 ENCOUNTER — Other Ambulatory Visit (HOSPITAL_COMMUNITY): Payer: Self-pay | Admitting: Family Medicine

## 2014-01-05 ENCOUNTER — Telehealth: Payer: Self-pay | Admitting: *Deleted

## 2014-01-05 NOTE — Telephone Encounter (Signed)
Please have her schedule an appt to see Dr. Sheral Apley (including labwork) when she calls back. Fleeger, Salome Spotted

## 2014-02-20 ENCOUNTER — Ambulatory Visit (INDEPENDENT_AMBULATORY_CARE_PROVIDER_SITE_OTHER): Payer: Medicare Other | Admitting: Family Medicine

## 2014-02-20 ENCOUNTER — Encounter: Payer: Self-pay | Admitting: Family Medicine

## 2014-02-20 VITALS — BP 143/79 | HR 69 | Temp 98.6°F | Ht 65.0 in | Wt 193.0 lb

## 2014-02-20 DIAGNOSIS — E119 Type 2 diabetes mellitus without complications: Secondary | ICD-10-CM

## 2014-02-20 DIAGNOSIS — M25561 Pain in right knee: Secondary | ICD-10-CM

## 2014-02-20 DIAGNOSIS — M25569 Pain in unspecified knee: Secondary | ICD-10-CM

## 2014-02-20 DIAGNOSIS — G479 Sleep disorder, unspecified: Secondary | ICD-10-CM

## 2014-02-20 LAB — POCT GLYCOSYLATED HEMOGLOBIN (HGB A1C): Hemoglobin A1C: 6.2

## 2014-02-20 MED ORDER — PROMETHAZINE HCL 25 MG PO TABS: 25.0000 mg | ORAL_TABLET | Freq: Four times a day (QID) | ORAL | Status: DC | PRN

## 2014-02-20 MED ORDER — METOCLOPRAMIDE HCL 5 MG PO TABS: ORAL_TABLET | ORAL | Status: DC

## 2014-02-20 MED ORDER — VENLAFAXINE HCL ER 75 MG PO CP24: ORAL_CAPSULE | ORAL | Status: DC

## 2014-02-20 MED ORDER — PANTOPRAZOLE SODIUM 40 MG PO TBEC: 40.0000 mg | DELAYED_RELEASE_TABLET | Freq: Every day | ORAL | Status: DC

## 2014-02-20 NOTE — Progress Notes (Signed)
Patient ID: [name] [name], female   DOB: September 26, 1951, 63 y.o.   MRN: [id number]    Subjective: HPI: Patient is a 63 y.o. female presenting to clinic today for follow up appointment. Concerns today include flank pain  1. Diabetes:  High at home: 200 Low at home: No symptomatic lows Taking medications: Lantus 5 units ROS: denies fever, chills, dizziness, LOC, polyuria, polydipsia, numbness or tingling in extremities or chest pain. Last eye exam: Summer 2014 - At [facility] Last foot exam: Needs today Nephropathy screen indicated?: No ESRD Last flu, zoster and/or pneumovax: Flu shot  2. Knee pain - Diagnosed with bursitis in the past. She has to walk with a cane or walker. She takes Tylenol for the pain which helps some. Knee locks up but has not given out, no recent falls. No swelling of knee.  3. Problems sleeping - Falls asleep in HD, and then problems sleeping at night due to being off schedule. Does not want to take more medications.  History Reviewed: Non smoker. Health Maintenance: Declines colonoscopy, given mammo information now that her port is out.  ROS: Please see HPI above.  Objective: Office vital signs reviewed. BP 143/79  Pulse 69  Temp(Src) 98.6 F (37 C) (Oral)  Ht 5\' 5"  (1.651 m)  Wt 193 lb (87.544 kg)  BMI 32.12 kg/m2  Physical Examination:  General: Awake, alert. NAD HEENT: Atraumatic, normocephalic. MMM Pulm: CTAB, no wheezes Cardio: RRR, no murmurs appreciated. Scar on right chest from prior port Abdomen:+BS, soft, nontender, nondistended Extremities: Trace edema. AVF in place LUE with palpable thrill  Right knee with mild edema. TTP medial joint line. Appropriate extension and flexion. No erythema or rash. Neuro: Strength and sensation grossly intact  Assessment: 63 y.o. female follow up.  Plan: See Problem List and After Visit Summary

## 2014-02-20 NOTE — Patient Instructions (Signed)
I have refilled your medications today.  Ask Dr. Jimmy Footman if he would recommend for you to have a steroid injection in your knee. If he thinks it is ok, you can call and schedule an injection appointment.  We will see you back in 3 months or sooner, if you need anything.  [name] [name] M. Cadel Stairs, M.D.

## 2014-02-22 DIAGNOSIS — G479 Sleep disorder, unspecified: Secondary | ICD-10-CM | POA: Insufficient documentation

## 2014-02-22 DIAGNOSIS — M25561 Pain in right knee: Secondary | ICD-10-CM | POA: Insufficient documentation

## 2014-02-22 NOTE — Assessment & Plan Note (Signed)
A: Stable, no lows.  P: - Con't current regimen. No changes - If she has lows, consider decreasing lantus - F/u 3 months - Bring copy of labs from HD

## 2014-02-22 NOTE — Assessment & Plan Note (Signed)
A: Chronic knee pain, but limits daily life. Has to walk with cane or walker. DDx includes OA, meniscus tear, functional pain due to muscle weakness.  P: - Offered steroid injection but she would like to talk to her renal doctor before she does this - Con't Tylenol for pain - F/u prn. May benefit from X-ray or further work up if not improving.

## 2014-02-22 NOTE — Assessment & Plan Note (Signed)
A: Most likely related to her sleep schedule with long HD sessions.  P: - Encouraged her to practice better sleep hygiene if possible - No medications at this time - F/u prn

## 2014-04-06 IMAGING — XA IR REMOVAL TUNNELED CV CATH
1 series · 2 of 2 positions shown · non-contrast
Comparison: none

CLINICAL DATA: Renal failure. The patient requires removal of a
tunneled dialysis catheter.

[Series 1: run · 2 of 2 slices shown]
[im 1/2]
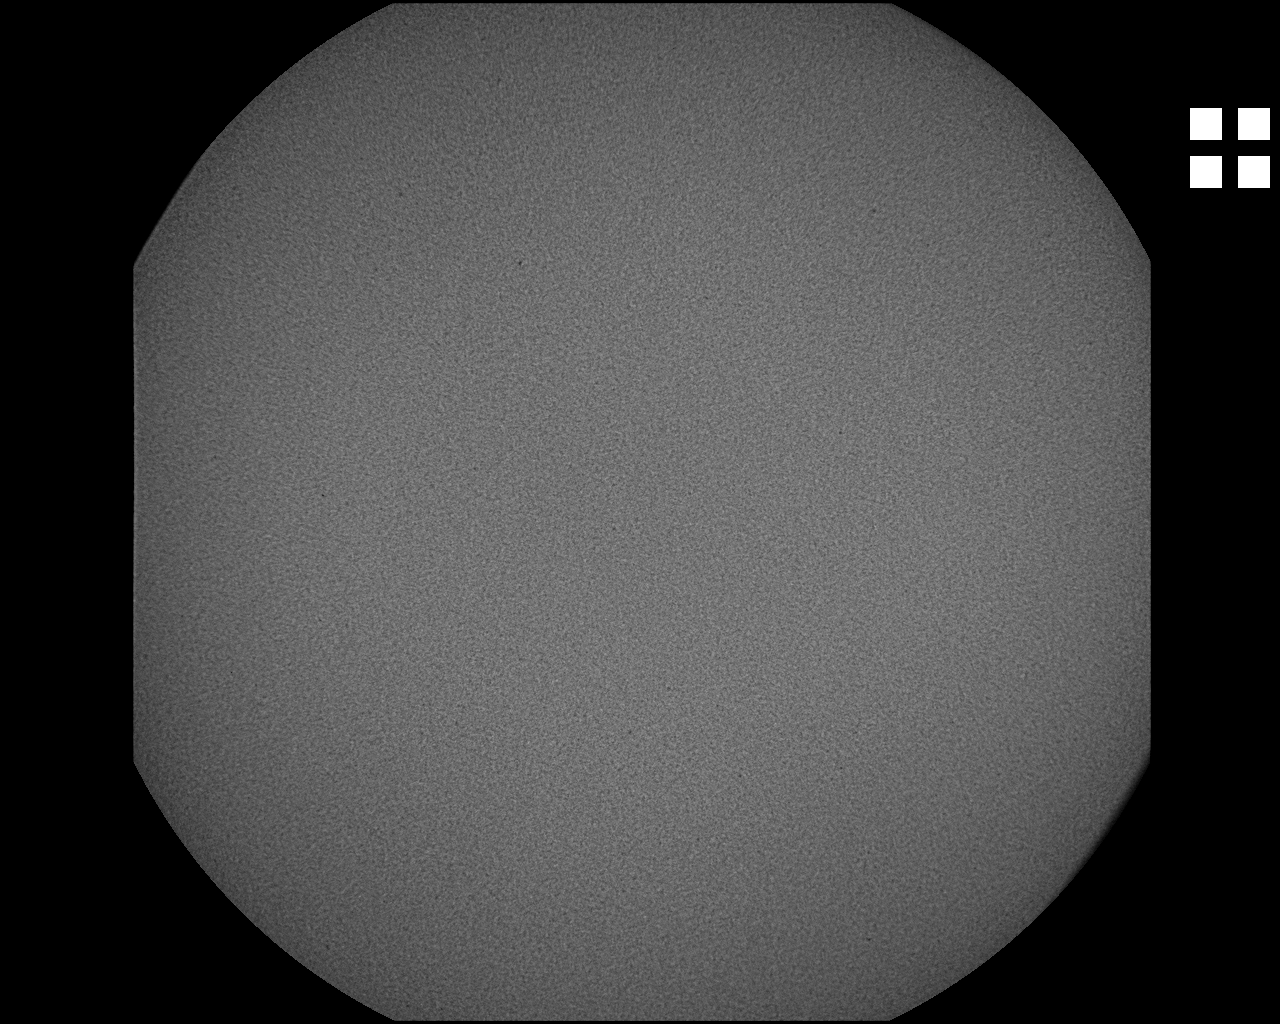
[im 2/2]
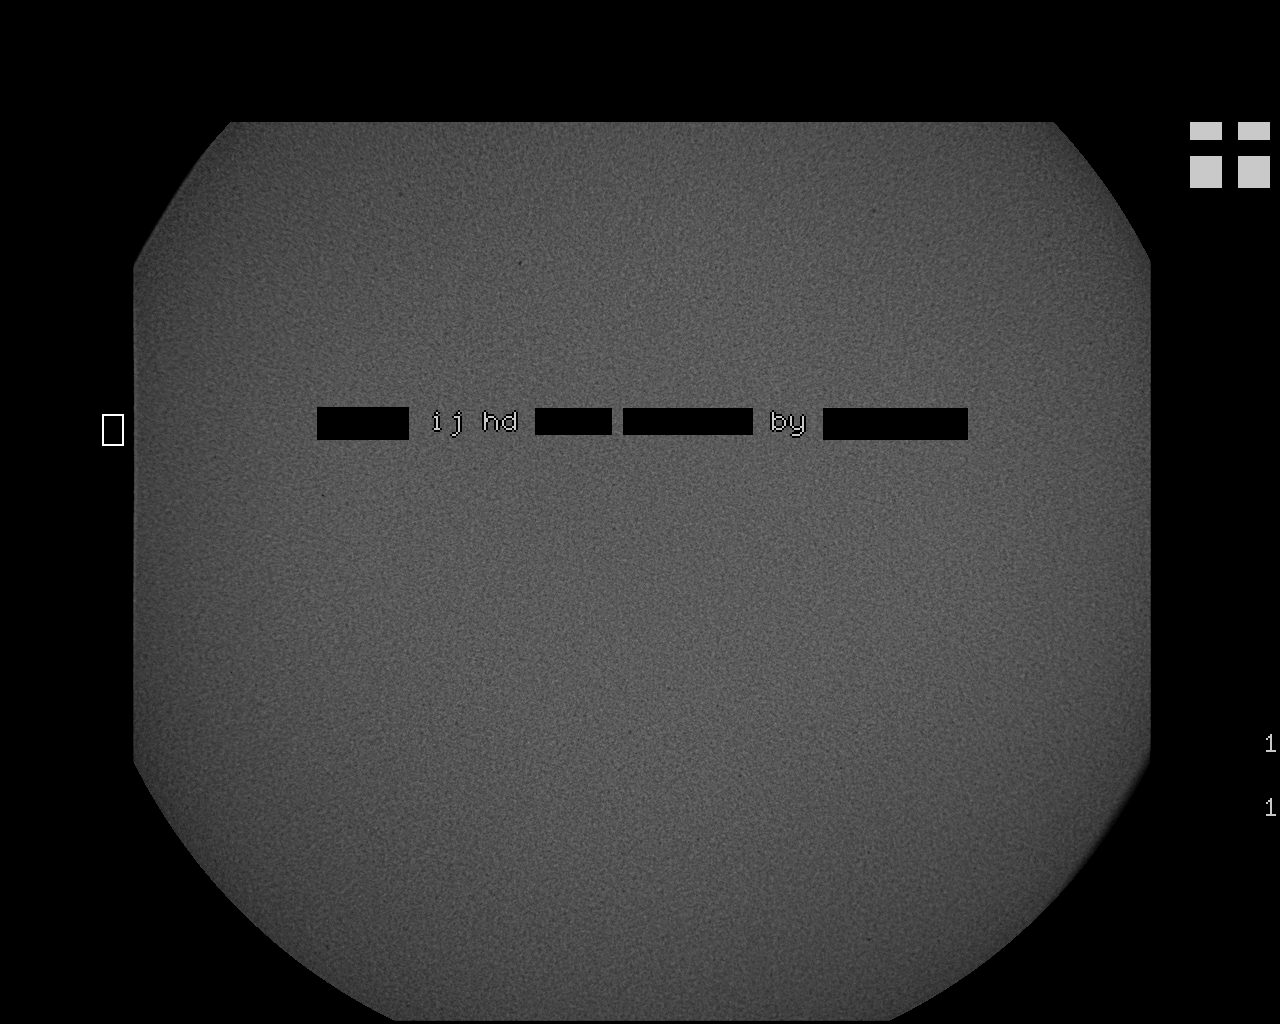

[2 of 2 positions shown; findings below may reference images not displayed]

EXAM:
REMOVAL OF TUNNELED CENTRAL VENOUS CATHETER

PROCEDURE:
Prior to the procedure, the patient was diaphoretic and demonstrated
decreased responsiveness. A CBG was 25 and the patient received 1
amp of D50 IV resulting in response of blood sugar up to
approximately 120 and significant improvement in symptoms. The
catheter removal was then able to be performed safely.

The right chest dialysis catheter site was prepped with
chlorhexidine. A sterile gown and gloves were worn during the
procedure. Local anesthesia was provided with 1% lidocaine.

Utilizing sharp and blunt dissection, the subcutaneous cuff of the
dialysis catheter was freed. The catheter was then successfully
removed in its entirety. A sterile dressing was applied over the
catheter exit site.
IMPRESSION: Removal of tunneled dialysis catheter utilizing sharp and blunt
dissection. The procedure was uncomplicated.

## 2014-06-12 ENCOUNTER — Ambulatory Visit (INDEPENDENT_AMBULATORY_CARE_PROVIDER_SITE_OTHER): Payer: Medicare Other | Admitting: Family Medicine

## 2014-06-12 ENCOUNTER — Ambulatory Visit: Payer: Medicare Other | Admitting: Family Medicine

## 2014-06-12 ENCOUNTER — Encounter: Payer: Self-pay | Admitting: Family Medicine

## 2014-06-12 VITALS — BP 135/72 | HR 75 | Temp 96.8°F | Resp 18 | Wt 204.0 lb

## 2014-06-12 DIAGNOSIS — R5381 Other malaise: Secondary | ICD-10-CM

## 2014-06-12 DIAGNOSIS — M25561 Pain in right knee: Secondary | ICD-10-CM

## 2014-06-12 DIAGNOSIS — M25569 Pain in unspecified knee: Secondary | ICD-10-CM

## 2014-06-12 DIAGNOSIS — R413 Other amnesia: Secondary | ICD-10-CM

## 2014-06-12 DIAGNOSIS — N186 End stage renal disease: Secondary | ICD-10-CM

## 2014-06-12 DIAGNOSIS — R5383 Other fatigue: Secondary | ICD-10-CM

## 2014-06-12 DIAGNOSIS — R5382 Chronic fatigue, unspecified: Secondary | ICD-10-CM

## 2014-06-12 DIAGNOSIS — E119 Type 2 diabetes mellitus without complications: Secondary | ICD-10-CM

## 2014-06-12 LAB — CBC WITH DIFFERENTIAL/PLATELET
BASOS ABS: 0.1 10*3/uL (ref 0.0–0.1)
Basophils Relative: 1 % (ref 0–1)
EOS ABS: 0.4 10*3/uL (ref 0.0–0.7)
Eosinophils Relative: 5 % (ref 0–5)
HCT: 32.6 % — ABNORMAL LOW (ref 36.0–46.0)
HEMOGLOBIN: 11.3 g/dL — AB (ref 12.0–15.0)
Lymphocytes Relative: 8 % — ABNORMAL LOW (ref 12–46)
Lymphs Abs: 0.7 10*3/uL (ref 0.7–4.0)
MCH: 34.3 pg — ABNORMAL HIGH (ref 26.0–34.0)
MCHC: 34.7 g/dL (ref 30.0–36.0)
MCV: 99.1 fL (ref 78.0–100.0)
MONO ABS: 0.9 10*3/uL (ref 0.1–1.0)
MONOS PCT: 11 % (ref 3–12)
NEUTROS ABS: 6.4 10*3/uL (ref 1.7–7.7)
NEUTROS PCT: 75 % (ref 43–77)
Platelets: 247 10*3/uL (ref 150–400)
RBC: 3.29 MIL/uL — ABNORMAL LOW (ref 3.87–5.11)
RDW: 16.1 % — AB (ref 11.5–15.5)
WBC: 8.5 10*3/uL (ref 4.0–10.5)

## 2014-06-12 LAB — LIPID PANEL
CHOLESTEROL: 191 mg/dL (ref 0–200)
HDL: 51 mg/dL (ref >39)
LDL CALC: 117 mg/dL — AB (ref 0–99)
Total CHOL/HDL Ratio: 3.7 Ratio
Triglycerides: 115 mg/dL (ref <150)
VLDL: 23 mg/dL (ref 0–40)

## 2014-06-12 LAB — POCT GLYCOSYLATED HEMOGLOBIN (HGB A1C): Hemoglobin A1C: 6.1

## 2014-06-12 LAB — TSH: TSH: 1.404 u[IU]/mL (ref 0.350–4.500)

## 2014-06-12 LAB — VITAMIN B12: Vitamin B-12: 778 pg/mL (ref 211–911)

## 2014-06-12 NOTE — Assessment & Plan Note (Addendum)
Patient reports she does not check her BS at home often, but they do check her BS at dialysis. Yesterday her BS was 180.  She is currently taking 5 units of Lantus daily in the mornings. She reports multiple episodes of hypoglycemic symptoms in the past few weeks including dizziness, nausea, vomiting, and night sweats. She does not check her BS during these episodes. Hemoglobin A1c was 6.1 today 06/12/2014 and 6.2 on 02/22/2014.   -Will decrease dose of Lantus to 3 units daily in the mornings due to recent hypoglycemic episodes and well-controlled A1c levels for the past two visits. Counseled patient to check her BS at home more frequently, and she should check her BS daily before taking her insulin. If her BS is <80, she should not take her insulin that day due to recent episodes of hypoglycemic symptoms. Counseled patient to stop taking her insulin if her BS's are always running <100.  -Counseled patient on the signs of hypoglycemia and to drink orange juice if she is experiencing these symptoms  -Follow-up in 3-4 weeks with PCP to discuss changes to insulin regimen and frequency of hypoglycemic episodes -Patient's last eye exam was 1 year ago, and she is currently in the process of scheduling another diabetic eye exam  -Non-fasting lipid panel performed today  -Diabetic foot exam was performed today  Maudie Mercury, MS3 06/12/2014    Addendum: A1C looks ok but will not want to stop medication all together.                    Plan to cut back to 3 units lantus daily.                    If Glucose remains less than 100 despite 3 unit she is advised to stopped insulin all together.                    Instructed to follow up with PCP in 2-3 wks or sooner if not feeling well.                    She verbalized understanding and agreed with plan.                    DM foot exam completed today.

## 2014-06-12 NOTE — Assessment & Plan Note (Signed)
Patient is currently on dialysis 3x weekly M-W-F. She sees the nephrologist every Wednesday and has blood work performed monthly. She reports her dialysis access in her left arm is clogged. She has an appointment at the vascular clinic for this in 3 days.  -Patient appears stable with this currently. Will continue current medications and dialysis.  -Keep appointment at vascular clinic for clogged dialysis access.   Maudie Mercury, MS3 06/12/2014

## 2014-06-12 NOTE — Patient Instructions (Addendum)
It was great to see you today.    We are going to change the amount of insulin you are taking daily. Start taking only 3 units of Lantus daily. Please start checking your blood sugars at home. Check your blood sugar before you take your insulin. If your blood sugar is less than 80, do not take your insulin that morning. If your blood sugars are always less than 100, you can stop taking the insulin completely. Please make a follow-up appointment with your PCP in 3-4 weeks from now.   If you feel that you are having signs of low blood sugar, such as dizziness, sweating, nausea, or vomiting, please drink orange juice to bring your sugar up. We have attached information below about low blood sugar.   We will call you about your lab work results.   The steroid injection should begin to help with your right knee pain. If this does not improve, follow-up with your PCP.   If you have any other concerns before your next appointment, please give [place] a call or go to the ED if needed.   Best wishes,  Maudie Mercury          Low Blood Sugar Low blood sugar (hypoglycemia) means that the level of sugar in your blood is lower than it should be. Signs of low blood sugar include:  Getting sweaty.  Feeling hungry.  Feeling dizzy or weak.  Feeling sleepier than normal.  Feeling nervous.  Headaches.  Having a fast heartbeat. Low blood sugar can happen fast and can be an emergency. Your doctor can do tests to check your blood sugar level. You can have low blood sugar and not have diabetes. HOME CARE  Check your blood sugar as told by your doctor. If it is less than 70 mg/dl or as told by your doctor, take 1 of the following:  3 to 4 glucose tablets.   cup clear juice.   cup soda pop, not diet.  1 cup milk.  5 to 6 hard candies.  Recheck blood sugar after 15 minutes. Repeat until it is at the right level.  Eat a snack if it is more than 1 hour until the next meal.  Only take medicine  as told by your doctor.  Do not skip meals. Eat on time.  Do not drink alcohol except with meals.  Check your blood glucose before driving.  Check your blood glucose before and after exercise.  Always carry treatment with you, such as glucose pills.  Always wear a medical alert bracelet if you have diabetes. GET HELP RIGHT AWAY IF:   Your blood glucose goes below 70 mg/dl or as told by your doctor, and you:  Are confused.  Are not able to swallow.  Pass out (faint).  You cannot treat yourself. You may need someone to help you.  You have low blood sugar problems often.  You have problems from your medicines.  You are not feeling better after 3 to 4 days.  You have vision changes. MAKE SURE YOU:   Understand these instructions.  Will watch this condition.  Will get help right away if you are not doing well or get worse. Document Released: 01/27/2010 Document Revised: 01/25/2012 Document Reviewed: 01/27/2010 [facility] Patient Information 2015 [place], [place]. This information is not intended to replace advice given to you by your health care provider. Make sure you discuss any questions you have with your health care provider.

## 2014-06-12 NOTE — Assessment & Plan Note (Signed)
Aging vs alzheimer. Baseline mental assess ok.  B12, TSH, RPR, HIV ordered. Patient to follow up with PCP for imaging if test results are normal.

## 2014-06-12 NOTE — Assessment & Plan Note (Addendum)
Patient reports increased fatigue, difficulty concentrating, and problems with decreased memory for the past few months. DDx includes anemia, vitamin B12 deficiency, hypothyroidism, HIV, syphilis, and depression.  -Will check CBC, B12, TSH, HIV, and RPR today.  -If normal, f/u with PCP for possible imaging/ mini-cog due to family hx of Alzheimer's in patient's mother.   Maudie Mercury, MS3 06/12/2014

## 2014-06-12 NOTE — Assessment & Plan Note (Addendum)
Patient reports chronic right medial knee pain for the past 15 years that is worse lately. The pain is worse with walking, and she uses a cane or walker to help with this. Last visit here in April 2015, she was given a steroid injection which helped decrease the pain. DDx includes bursitis, OA, or medial meniscal injury.  -Gave another steroid injection today  -If not improving, f/u with PCP for possible imaging.   Maudie Mercury, MS3 06/12/2014  Addendum: Intraarticular steroid injection given after obtaining informed and written consent, procedure was well tolerated.                    No complication after procedure.

## 2014-06-12 NOTE — Progress Notes (Signed)
Subjective:     Patient ID: [name] [name], female   DOB: [birth year], 63 y.o.   MRN: [id number]  HPI  [name] [name] is a 63 yo female that presents to the clinic today for right knee pain, fatigue,  and follow-up of DM Type II and ESRD.   Right knee pain:  She complains of right knee pain for the past 15 years. She complains of difficulty walking due to this pain and has been using a cane or walker to avoid falling. During her last visit here in April 2015, she was given a steroid injection that helped with this. She would like another steroid injection today.   Fatigue/SOB/decreased memory:  The patient reports occasional shortness of breath with walking for the past few months. She also complains of increased fatigue daily, difficulty concentrating, and problems with memory. Her mother has a hx of Alzheimer's, and she is worried she may have this now.   DM Type II:  She is still currently taking Lantus 5 units every morning. She does not check her BS at home very often. She only checks her BS at home 2-3 times per week. They do check her BS at dialysis, and she reports her BS was 180 yesterday. She also reports episodes of hypoglycemic symptoms in the past few weeks. One week ago, she woke up at 5 AM with nausea and vomiting x3. She did not check her BS during this episode. She ate toast and sugar-free applesauce during this episode. She also reports increased episodes of dizziness and night sweats lately. The lowest BS she has recorded was in the 50s-60s when she was at the [employer] and had a syncopal episode requiring EMS response after not eating breakfast that morning. She has monthly foot exams at the dialysis center. She denies any ulcerations or skin breakdown at this time.  She reports intermittent numbness in her lower extremities and feet bilaterally. Her last eye exam was one year ago. She is currently in the process of making an appointment for another eye exam.  She denies fevers or  chest pain.   ESRD:  The patient is currently on dialysis 3x weekly every M-W-F. She sees the nephrologist every Wednesday. Once a month, they obtain lab work at dialysis, but she is unsure of which labs they perform and does not have these records with her today. She reports the dialysis access in her left arm is clogged, and she has an appointment at the vascular clinic for this in 3 days.  She also takes two Vitamin D pills at dialysis each day.  Current Outpatient Prescriptions on File Prior to Visit  Medication Sig Dispense Refill  . calcium carbonate (OS-CAL - DOSED IN MG OF ELEMENTAL CALCIUM) 1250 MG tablet TAKE 1 TABLET BY MOUTH THREE TIMES DAILY WITH MEALS  100 tablet  0  . DOCQLACE 100 MG capsule TAKE ONE CAPSULE BY MOUTH EVERY DAY AS NEEDED  30 capsule  11  . LANTUS 100 UNIT/ML injection INJECT 0.04ML INTO SKIN DAILY  10 mL  6  . multivitamin (RENA-VIT) TABS tablet Take 1 tablet by mouth at bedtime.  90 tablet  3  . pantoprazole (PROTONIX) 40 MG tablet Take 1 tablet (40 mg total) by mouth daily.  30 tablet  11  . promethazine (PHENERGAN) 25 MG tablet Take 1 tablet (25 mg total) by mouth every 6 (six) hours as needed for nausea.  30 tablet  11  . venlafaxine XR (EFFEXOR-XR) 75 MG 24 hr  capsule TAKE ONE CAPSULE BY MOUTH DAILY WITH BREAKFAST  30 capsule  11  . aspirin 81 MG EC tablet Take 1 tablet (81 mg total) by mouth daily.  30 tablet  0  . cinacalcet (SENSIPAR) 60 MG tablet Take 60 mg by mouth daily.      . metoCLOPramide (REGLAN) 5 MG tablet TAKE 1 TABLET BY MOUTH DAILY BEFORE BREAKFAST  30 tablet  11   No current facility-administered medications on file prior to visit.   Past Medical History  Diagnosis Date  . Hearing difficulty     Mild   . Diabetes mellitus without complication   . Night muscle spasms   . Bursitis   . Fatigue     Review of Systems  Respiratory: Positive for shortness of breath. Negative for cough and wheezing.   Cardiovascular: Negative.    Gastrointestinal: Negative.   Musculoskeletal: Positive for arthralgias.  Neurological:       Memory loss  All other systems reviewed and are negative.  As per HPI.     Objective:   Physical Exam  Nursing note and vitals reviewed. Constitutional: She appears well-developed. No distress.  Cardiovascular: Normal rate, regular rhythm and normal heart sounds.   No murmur heard. Pulmonary/Chest: Effort normal and breath sounds normal. No respiratory distress. She has no wheezes. She exhibits no tenderness.  Abdominal: Soft. Bowel sounds are normal. She exhibits no distension. There is no tenderness.  Musculoskeletal:       Right knee: She exhibits decreased range of motion. She exhibits no swelling. Tenderness found.       Left knee: Normal.  Neurological: She is alert.   Filed Vitals:   06/12/14 1012  BP: 135/72  Pulse: 75  Temp: 96.8 F (36 C)  Resp: 18   General: Well-appearing. NAD.  Cardiac: RRR. No murmurs, rubs, or gallops.  Lungs: CTA. No wheezes. No crackles. Normal work of breathing.  Extremities: No LE edema. Tenderness to medial joint line of right knee. Walks with cane.  Neuro: A&Ox3.   [place] ATTENDING NOTE Kehinde Eniola,MD I  have seen and examined this patient, reviewed their chart. I have discussed this patient with the medical studentt. I agree with the student's findings, assessment and care plan.      Assessment:      Right knee pain Fatigue SOB Memory loss DM2 ESRD on HD     Plan:     Check problem list.

## 2014-06-13 ENCOUNTER — Telehealth: Payer: Self-pay | Admitting: Family Medicine

## 2014-06-13 LAB — HIV ANTIBODY (ROUTINE TESTING W REFLEX): HIV: NONREACTIVE

## 2014-06-13 LAB — RPR

## 2014-06-13 NOTE — Telephone Encounter (Signed)
I spoke with patient about her insulin regimen, she is now on Lantus 3 unit qd,this morning prior to taking her insulin her capillary glucose was 195. Patient will continue 3 units for now pending f/u with her PCP, since A1C was 6.1 she might be able to come off insulin all together due to frequent hypoglycemia. She has follow up with her PCP in 2-3 wks, he will decide if she should continue Lantus or not.

## 2014-06-14 ENCOUNTER — Telehealth: Payer: Self-pay | Admitting: Family Medicine

## 2014-06-14 NOTE — Telephone Encounter (Signed)
Test result for memory loss work up discussed with patient. It was essentially normal. She is instructed to follow up with her PCP for further evaluation. She agreed with plan and verbalized understanding.

## 2014-07-12 ENCOUNTER — Encounter: Payer: Self-pay | Admitting: Family Medicine

## 2014-07-12 ENCOUNTER — Ambulatory Visit (INDEPENDENT_AMBULATORY_CARE_PROVIDER_SITE_OTHER): Payer: Medicare Other | Admitting: Family Medicine

## 2014-07-12 VITALS — BP 123/77 | HR 62 | Temp 97.9°F | Wt 201.0 lb

## 2014-07-12 DIAGNOSIS — E119 Type 2 diabetes mellitus without complications: Secondary | ICD-10-CM

## 2014-07-12 DIAGNOSIS — N186 End stage renal disease: Secondary | ICD-10-CM

## 2014-07-12 DIAGNOSIS — R413 Other amnesia: Secondary | ICD-10-CM

## 2014-07-12 LAB — HM DIABETES EYE EXAM

## 2014-07-12 MED ORDER — INSULIN GLARGINE 100 UNIT/ML ~~LOC~~ SOLN: 3.0000 [IU] | Freq: Every day | SUBCUTANEOUS | Status: DC

## 2014-07-12 NOTE — Assessment & Plan Note (Signed)
Patient had decreased lantus to 2 units instead of 3 units. Has had more elevations in cbgs since this. Will increase to 3 u given increased cgbs. Will follow-up in 3 months for A1c. Advised if blood sugar is lower than 100 on consistent basis she is to call and let us know.

## 2014-07-12 NOTE — Assessment & Plan Note (Signed)
Dialysis access has been replaced and patient has been tolerating dialysis well. MWF schedule. Currently stable. She is to continue to follow up with nephrology.

## 2014-07-12 NOTE — Patient Instructions (Signed)
Nice to meet you. Please take 4 units of lantus each day. We will let you know the results of your eye exam. I will see you back in 3 months.

## 2014-07-12 NOTE — Assessment & Plan Note (Signed)
Likely related to aging at this time. MMSE normal. Lab work unremarkable. Will continue to monitor as her mother had dementia. F/u prn.

## 2014-07-12 NOTE — Progress Notes (Signed)
Patient ID: [name] [name], female   DOB: [birth year], 62 y.o.   MRN: [id number]  Tommi Rumps, MD Phone: [phone]  [name] [name] is a 63 y.o. female who presents today for f/u.  DIABETES Disease Monitoring: Blood Sugar ranges-140-200 Polyuria/phagia/dipsia- no      Visual problems- no Medications: Compliance- taking lantus 2 units daily Hypoglycemic symptoms- infrequent  Memory loss: she notes being worried about this since her mother had alzheimers dementia. She had lab work checked at her last visit that was normal, including TSH, CBC, B12, RPR, and HIV. She notes there are times when she goes somewhere and forgets why she is there. She has not gotten lost.  ESRD: is MWF for dialysis. Recently had to have her access redone as she'd had collapse of her access previously. She had this done about a month ago. No issues with the new access site. She makes a small amount of urine. Takes vitamin D at dialysis and iron supplements.   Patient is a nonsmoker.   ROS: Per HPI   Physical Exam Filed Vitals:   07/12/14 1403  BP: 123/77  Pulse: 62  Temp: 97.9 F (36.6 C)    Gen: Well NAD HEENT: PERRL,  MMM Lungs: CTABL Nl WOB Heart: RRR no MRG Exts: Non edematous BL  LE, warm and well perfused. Left UE with graft site with good bruit. MMSE 27/30  Assessment/Plan: Please see individual problem list.  # Healthcare maintenance: retinal scan performed today.  Tommi Rumps, MD [facility] PGY-3

## 2014-07-16 ENCOUNTER — Encounter: Payer: Self-pay | Admitting: Family Medicine

## 2014-07-16 DIAGNOSIS — E11339 Type 2 diabetes mellitus with moderate nonproliferative diabetic retinopathy without macular edema: Secondary | ICD-10-CM | POA: Insufficient documentation

## 2014-07-16 DIAGNOSIS — E113399 Type 2 diabetes mellitus with moderate nonproliferative diabetic retinopathy without macular edema, unspecified eye: Secondary | ICD-10-CM | POA: Insufficient documentation

## 2014-07-18 ENCOUNTER — Telehealth: Payer: Self-pay | Admitting: Family Medicine

## 2014-07-18 ENCOUNTER — Encounter: Payer: Self-pay | Admitting: Family Medicine

## 2014-07-18 DIAGNOSIS — E11319 Type 2 diabetes mellitus with unspecified diabetic retinopathy without macular edema: Secondary | ICD-10-CM | POA: Insufficient documentation

## 2014-07-18 NOTE — Telephone Encounter (Signed)
Called patient to inform of retinal imaging results. Patient with moderate diabetic retinopathy in the left eye and mild diabetic retinopathy in the right eye. Advised patient that we would need to repeat the photographs in 6 months.

## 2014-09-03 ENCOUNTER — Other Ambulatory Visit: Payer: Self-pay | Admitting: *Deleted

## 2014-09-04 MED ORDER — RENA-VITE PO TABS: 1.0000 | ORAL_TABLET | Freq: Every day | ORAL | Status: DC

## 2014-11-18 ENCOUNTER — Emergency Department (HOSPITAL_COMMUNITY)
Admission: EM | Admit: 2014-11-18 | Discharge: 2014-11-18 | Disposition: A | Discharge status: Home / Self Care | Payer: Medicare Other | Attending: Emergency Medicine | Admitting: Emergency Medicine

## 2014-11-18 ENCOUNTER — Encounter (HOSPITAL_COMMUNITY): Payer: Self-pay | Admitting: *Deleted

## 2014-11-18 DIAGNOSIS — Z7982 Long term (current) use of aspirin: Secondary | ICD-10-CM | POA: Diagnosis not present

## 2014-11-18 DIAGNOSIS — Z8739 Personal history of other diseases of the musculoskeletal system and connective tissue: Secondary | ICD-10-CM | POA: Diagnosis not present

## 2014-11-18 DIAGNOSIS — Y998 Other external cause status: Secondary | ICD-10-CM | POA: Insufficient documentation

## 2014-11-18 DIAGNOSIS — Y92241 Library as the place of occurrence of the external cause: Secondary | ICD-10-CM | POA: Insufficient documentation

## 2014-11-18 DIAGNOSIS — E119 Type 2 diabetes mellitus without complications: Secondary | ICD-10-CM | POA: Diagnosis not present

## 2014-11-18 DIAGNOSIS — Y9389 Activity, other specified: Secondary | ICD-10-CM | POA: Diagnosis not present

## 2014-11-18 DIAGNOSIS — Z794 Long term (current) use of insulin: Secondary | ICD-10-CM | POA: Insufficient documentation

## 2014-11-18 DIAGNOSIS — Z79899 Other long term (current) drug therapy: Secondary | ICD-10-CM | POA: Insufficient documentation

## 2014-11-18 DIAGNOSIS — W01198A Fall on same level from slipping, tripping and stumbling with subsequent striking against other object, initial encounter: Secondary | ICD-10-CM | POA: Insufficient documentation

## 2014-11-18 DIAGNOSIS — S0181XA Laceration without foreign body of other part of head, initial encounter: Secondary | ICD-10-CM | POA: Insufficient documentation

## 2014-11-18 LAB — BASIC METABOLIC PANEL
Anion gap: 10 (ref 5–15)
BUN: 33 mg/dL — AB (ref 6–23)
CALCIUM: 8.5 mg/dL (ref 8.4–10.5)
CHLORIDE: 97 meq/L (ref 96–112)
CO2: 27 mmol/L (ref 19–32)
CREATININE: 5.9 mg/dL — AB (ref 0.50–1.10)
GFR calc Af Amer: 8 mL/min — ABNORMAL LOW (ref >90)
GFR calc non Af Amer: 7 mL/min — ABNORMAL LOW (ref >90)
GLUCOSE: 127 mg/dL — AB (ref 70–99)
Potassium: 3.7 mmol/L (ref 3.5–5.1)
Sodium: 134 mmol/L — ABNORMAL LOW (ref 135–145)

## 2014-11-18 LAB — CBC WITH DIFFERENTIAL/PLATELET
Basophils Absolute: 0.1 10*3/uL (ref 0.0–0.1)
Basophils Relative: 1 % (ref 0–1)
EOS PCT: 4 % (ref 0–5)
Eosinophils Absolute: 0.3 10*3/uL (ref 0.0–0.7)
HCT: 41 % (ref 36.0–46.0)
HEMOGLOBIN: 13.1 g/dL (ref 12.0–15.0)
LYMPHS ABS: 0.8 10*3/uL (ref 0.7–4.0)
LYMPHS PCT: 12 % (ref 12–46)
MCH: 33.5 pg (ref 26.0–34.0)
MCHC: 32 g/dL (ref 30.0–36.0)
MCV: 104.9 fL — AB (ref 78.0–100.0)
MONO ABS: 0.6 10*3/uL (ref 0.1–1.0)
MONOS PCT: 9 % (ref 3–12)
NEUTROS ABS: 5 10*3/uL (ref 1.7–7.7)
Neutrophils Relative %: 74 % (ref 43–77)
Platelets: 120 10*3/uL — ABNORMAL LOW (ref 150–400)
RBC: 3.91 MIL/uL (ref 3.87–5.11)
RDW: 14.4 % (ref 11.5–15.5)
WBC: 6.6 10*3/uL (ref 4.0–10.5)

## 2014-11-18 MED ORDER — BACITRACIN ZINC 500 UNIT/GM EX OINT: TOPICAL_OINTMENT | Freq: Once | CUTANEOUS | Status: DC

## 2014-11-18 MED ORDER — TETANUS-DIPHTHERIA TOXOIDS TD 5-2 LFU IM INJ
0.5000 mL | INJECTION | Freq: Once | INTRAMUSCULAR | Status: AC
  Administered 2014-11-18: 0.5 mL via INTRAMUSCULAR
  Filled 2014-11-18: qty 0.5

## 2014-11-18 MED ORDER — LIDOCAINE-EPINEPHRINE (PF) 2 %-1:200000 IJ SOLN
20.0000 mL | Freq: Once | INTRAMUSCULAR | Status: AC
  Administered 2014-11-18: 10 mL via INTRADERMAL
  Filled 2014-11-18: qty 20

## 2014-11-18 MED ORDER — OXYCODONE-ACETAMINOPHEN 5-325 MG PO TABS
1.0000 | ORAL_TABLET | Freq: Once | ORAL | Status: AC
  Administered 2014-11-18: 1 via ORAL
  Filled 2014-11-18: qty 1

## 2014-11-18 MED ORDER — BACITRACIN ZINC 500 UNIT/GM EX OINT: TOPICAL_OINTMENT | Freq: Two times a day (BID) | CUTANEOUS | Status: DC

## 2014-11-18 NOTE — ED Notes (Signed)
The pts husband has been contacted and he knows his wife is ok.  Pt will be going home in a taxi after her treatment

## 2014-11-18 NOTE — ED Provider Notes (Signed)
CSN: [id number]     Arrival date & time 11/18/14  1725 History   First MD Initiated Contact with Patient 11/18/14 1740     Chief Complaint  Patient presents with  . Fall  . Facial Laceration     (Consider location/radiation/quality/duration/timing/severity/associated sxs/prior Treatment) Patient is a 64 y.o. female presenting with fall and scalp laceration. The history is provided by the patient.  Fall This is a new problem. The current episode started today. The problem occurs constantly. Associated symptoms include headaches (only after fall, no headahce preceeding fall). Pertinent negatives include no abdominal pain, arthralgias, chest pain, chills, coughing, diaphoresis, fatigue, fever, myalgias, nausea, numbness, rash, sore throat, vomiting or weakness.  Head Laceration This is a new problem. The current episode started today. Associated symptoms include headaches (only after fall, no headahce preceeding fall). Pertinent negatives include no abdominal pain, arthralgias, chest pain, chills, coughing, diaphoresis, fatigue, fever, myalgias, nausea, numbness, rash, sore throat, vomiting or weakness. She has tried nothing for the symptoms. The treatment provided no relief.    Past Medical History  Diagnosis Date  . Hearing difficulty     Mild   . Diabetes mellitus without complication   . Night muscle spasms   . Bursitis   . Fatigue    Past Surgical History  Procedure Laterality Date  . 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 Esophagogastroduodenoscopy Left 02/20/2013    Procedure: ESOPHAGOGASTRODUODENOSCOPY (EGD);  Surgeon: Cleotis Nipper, MD;  Location: [facility] ENDOSCOPY;  Service: Endoscopy;  Laterality: Left;  . Av fistula placement Left 03/07/2013    Procedure: ARTERIOVENOUS (AV) FISTULA CREATION;  Surgeon: Elam Dutch, MD;  Location: [facility] OR;  Service: Vascular;  Laterality: Left;  left brachiocephalic AVF   Family History  Problem Relation Age of  Onset  . Heart disease    . Diabetes    . Stroke    . Bone cancer Father     Of jaws   . Throat cancer Father    History  Substance Use Topics  . Smoking status: Never Smoker   . Smokeless tobacco: Never Used  . Alcohol Use: No   OB History    No data available     Review of Systems  Constitutional: Negative for fever, chills, diaphoresis, activity change, appetite change and fatigue.  HENT: Negative for facial swelling, rhinorrhea, sore throat, trouble swallowing and voice change.   Eyes: Negative for photophobia, pain and visual disturbance.  Respiratory: Negative for cough, shortness of breath, wheezing and stridor.   Cardiovascular: Negative for chest pain, palpitations and leg swelling.  Gastrointestinal: Negative for nausea, vomiting, abdominal pain, constipation and anal bleeding.  Endocrine: Negative.   Genitourinary: Negative for dysuria, vaginal bleeding, vaginal discharge and vaginal pain.  Musculoskeletal: Negative for myalgias, back pain and arthralgias.  Skin: Positive for wound. Negative for rash.  Allergic/Immunologic: Negative.   Neurological: Positive for headaches (only after fall, no headahce preceeding fall). Negative for dizziness, tremors, syncope, weakness and numbness.  Psychiatric/Behavioral: Negative for suicidal ideas, sleep disturbance and self-injury.  All other systems reviewed and are negative.     Allergies  Aleve and Motrin  Home Medications   Prior to Admission medications   Medication Sig Start Date End Date Taking? Authorizing Provider  aspirin 81 MG EC tablet Take 1 tablet (81 mg total) by mouth daily. 03/08/13   Kandis Nab, MD  calcium carbonate (OS-CAL - DOSED IN MG OF ELEMENTAL CALCIUM) 1250 MG tablet TAKE 1  TABLET BY MOUTH THREE TIMES DAILY WITH MEALS 07/15/13   Montez Morita, MD  cinacalcet (SENSIPAR) 60 MG tablet Take 60 mg by mouth daily.    Historical Provider, MD  DOCQLACE 100 MG capsule TAKE ONE CAPSULE BY MOUTH  EVERY DAY AS NEEDED 12/10/13   Montez Morita, MD  insulin glargine (LANTUS) 100 UNIT/ML injection Inject 0.03 mLs (3 Units total) into the skin at bedtime. 07/12/14   Leone Haven, MD  metoCLOPramide (REGLAN) 5 MG tablet TAKE 1 TABLET BY MOUTH DAILY BEFORE BREAKFAST 02/20/14   Montez Morita, MD  multivitamin (RENA-VIT) TABS tablet Take 1 tablet by mouth at bedtime. 09/04/14   Leone Haven, MD  pantoprazole (PROTONIX) 40 MG tablet Take 1 tablet (40 mg total) by mouth daily. 02/20/14   Amber Fidel Levy, MD  promethazine (PHENERGAN) 25 MG tablet Take 1 tablet (25 mg total) by mouth every 6 (six) hours as needed for nausea. 02/20/14   Amber Fidel Levy, MD  venlafaxine XR (EFFEXOR-XR) 75 MG 24 hr capsule TAKE ONE CAPSULE BY MOUTH DAILY WITH BREAKFAST 02/20/14   Amber M Hairford, MD   BP 142/95 mmHg  Pulse 61  Temp(Src) 98.3 F (36.8 C) (Oral)  Resp 16  Ht 5\' 5"  (1.651 m)  Wt 200 lb (90.719 kg)  BMI 33.28 kg/m2  SpO2 97% Physical Exam  Constitutional: She is oriented to person, place, and time. She appears well-developed and well-nourished. No distress.  HENT:  Head: Normocephalic. Head is with laceration. Head is without raccoon's eyes, without Battle's sign, without right periorbital erythema and without left periorbital erythema.    Right Ear: External ear normal. No hemotympanum.  Left Ear: External ear normal. No hemotympanum.  Nose: No nasal septal hematoma. No epistaxis.  Mouth/Throat: Uvula is midline and oropharynx is clear and moist. No oropharyngeal exudate.  Two lacerations to left forehead. Upper on is 4 cm, linear, hemostatic with light pressure. Lower is 6 cm, V shaped, slightly avulsed, hemostatic with pressure. No foreign bodies on inspection.  Eyes: Conjunctivae and EOM are normal. Pupils are equal, round, and reactive to light. No scleral icterus.  Neck: Normal range of motion. Neck supple. No JVD present. No tracheal deviation present. No thyromegaly present.    Cardiovascular: Normal rate, regular rhythm and intact distal pulses.  Exam reveals no gallop and no friction rub.   No murmur heard. Pulmonary/Chest: Effort normal and breath sounds normal. No respiratory distress. She has no wheezes. She has no rales.  Abdominal: Soft. Bowel sounds are normal. She exhibits no distension. There is no tenderness.  Musculoskeletal: Normal range of motion. She exhibits no edema or tenderness.  Neurological: She is alert and oriented to person, place, and time. No cranial nerve deficit. She exhibits normal muscle tone. Coordination normal.  5/5 strength in all 4 extremities. Normal Gait. Sensation intact and equal in all 4 extremities and all CN V distributions of the face.  Skin: Skin is warm and dry. She is not diaphoretic. No pallor.  Psychiatric: She has a normal mood and affect. She expresses no homicidal and no suicidal ideation. She expresses no suicidal plans and no homicidal plans.  Nursing note and vitals reviewed.   ED Course  LACERATION REPAIR Date/Time: 11/18/2014 8:36 PM Performed by: Margaretann Loveless Authorized by: Carmin Muskrat Consent: Verbal consent obtained. Risks and benefits: risks, benefits and alternatives were discussed Consent given by: patient Patient understanding: patient states understanding of the procedure being performed Patient identity confirmed: verbally with  patient, arm band and hospital-assigned identification number Time out: Immediately prior to procedure a "time out" was called to verify the correct patient, procedure, equipment, support staff and site/side marked as required. Body area: head/neck Location details: forehead Wound length (cm): 2 lacerations, one is 4 cm, one is 6 cm. Foreign bodies: no foreign bodies Tendon involvement: none Nerve involvement: none Vascular damage: no Anesthesia: local infiltration Local anesthetic: lidocaine 2% with epinephrine Anesthetic total: 8 ml Patient sedated:  no Preparation: Patient was prepped and draped in the usual sterile fashion. Irrigation solution: saline Irrigation method: jet lavage Amount of cleaning: extensive Debridement: none Degree of undermining: none Skin closure: 5-0 Prolene Number of sutures: 11 Technique: simple Approximation: loose Approximation difficulty: complex Dressing: 4x4 sterile gauze and antibiotic ointment Patient tolerance: Patient tolerated the procedure well with no immediate complications   (including critical care time) Labs Review Labs Reviewed  CBC WITH DIFFERENTIAL - Abnormal; Notable for the following:    MCV 104.9 (*)    Platelets 120 (*)    All other components within normal limits  BASIC METABOLIC PANEL - Abnormal; Notable for the following:    Sodium 134 (*)    Glucose, Bld 127 (*)    BUN 33 (*)    Creatinine, Ser 5.90 (*)    GFR calc non Af Amer 7 (*)    GFR calc Af Amer 8 (*)    All other components within normal limits    Imaging Review No results found.   EKG Interpretation None      MDM   Final diagnoses:  Facial laceration, initial encounter    The patient is a 64 y.o. F with ESRD on MWF dialysis who presents with 2 forehead lacerations after suffering a mechanical fall outside [employer]. The patient states she simply slipped when stepping off of a curb and denies any shortness of breath, chest pain, headache, dizziness, or other symptoms preceding the fall. No neurologic deficits on exam in the ED. Exam as above, do not suspect skull fracture or intracranial bleed and will not obtain imaging. Labs all within normal limits. Patient unsure of last tetanus shot 2 tetanus is updated in the ED. Lacerations are extensively washed out and repaired with simple sutures as above. Patient given standard wound care instructions and told to have stitches removed in 5-7 days. Patient expresses understanding and agreement with this plan and is discharged home.   Patient seen with  attending, Dr. Vanita Panda, who oversaw clinical decision making.   Margaretann Loveless, MD 11/18/14 2049  Carmin Muskrat, MD 11/19/14 [phone]

## 2014-11-18 NOTE — Discharge Instructions (Signed)
Facial Laceration  A facial laceration is a cut on the face. These injuries can be painful and cause bleeding. Lacerations usually heal quickly, but they need special care to reduce scarring. DIAGNOSIS  Your health care provider will take a medical history, ask for details about how the injury occurred, and examine the wound to determine how deep the cut is. TREATMENT  Some facial lacerations may not require closure. Others may not be able to be closed because of an increased risk of infection. The risk of infection and the chance for successful closure will depend on various factors, including the amount of time since the injury occurred. The wound may be cleaned to help prevent infection. If closure is appropriate, pain medicines may be given if needed. Your health care provider will use stitches (sutures), wound glue (adhesive), or skin adhesive strips to repair the laceration. These tools bring the skin edges together to allow for faster healing and a better cosmetic outcome. If needed, you may also be given a tetanus shot. HOME CARE INSTRUCTIONS  Only take over-the-counter or prescription medicines as directed by your health care provider.  Follow your health care provider's instructions for wound care. These instructions will vary depending on the technique used for closing the wound. For Sutures:  Keep the wound clean and dry.   If you were given a bandage (dressing), you should change it at least once a day. Also change the dressing if it becomes wet or dirty, or as directed by your health care provider.   Wash the wound with soap and water 2 times a day. Rinse the wound off with water to remove all soap. Pat the wound dry with a clean towel.   After cleaning, apply a thin layer of the antibiotic ointment recommended by your health care provider. This will help prevent infection and keep the dressing from sticking.   You may shower as usual after the first 24 hours. Do not soak the  wound in water until the sutures are removed.   Get your sutures removed as directed by your health care provider. With facial lacerations, sutures should usually be taken out after 4-5 days to avoid stitch marks.   Wait a few days after your sutures are removed before applying any makeup. For Skin Adhesive Strips:  Keep the wound clean and dry.   Do not get the skin adhesive strips wet. You may bathe carefully, using caution to keep the wound dry.   If the wound gets wet, pat it dry with a clean towel.   Skin adhesive strips will fall off on their own. You may trim the strips as the wound heals. Do not remove skin adhesive strips that are still stuck to the wound. They will fall off in time.  For Wound Adhesive:  You may briefly wet your wound in the shower or bath. Do not soak or scrub the wound. Do not swim. Avoid periods of heavy sweating until the skin adhesive has fallen off on its own. After showering or bathing, gently pat the wound dry with a clean towel.   Do not apply liquid medicine, cream medicine, ointment medicine, or makeup to your wound while the skin adhesive is in place. This may loosen the film before your wound is healed.   If a dressing is placed over the wound, be careful not to apply tape directly over the skin adhesive. This may cause the adhesive to be pulled off before the wound is healed.   Avoid   prolonged exposure to sunlight or tanning lamps while the skin adhesive is in place.  The skin adhesive will usually remain in place for 5-10 days, then naturally fall off the skin. Do not pick at the adhesive film.  After Healing: Once the wound has healed, cover the wound with sunscreen during the day for 1 full year. This can help minimize scarring. Exposure to ultraviolet light in the first year will darken the scar. It can take 1-2 years for the scar to lose its redness and to heal completely.  SEEK IMMEDIATE MEDICAL CARE IF:  You have redness, pain, or  swelling around the wound.   You see ayellowish-white fluid (pus) coming from the wound.   You have chills or a fever.  MAKE SURE YOU:  Understand these instructions.  Will watch your condition.  Will get help right away if you are not doing well or get worse. Document Released: 12/10/2004 Document Revised: 08/23/2013 Document Reviewed: 06/15/2013 ExitCare Patient Information 2015 [employer]. This information is not intended to replace advice given to you by your health care provider. Make sure you discuss any questions you have with your health care provider.  

## 2014-11-18 NOTE — ED Notes (Signed)
The pt  Is alert  And concerned that her husband does nit knw where she is and she cannot get in tough with her husband.  He is not answering the phone at home.  Blanket given

## 2014-11-18 NOTE — ED Notes (Signed)
Pt arrived via [facility] from [employer]. Pt lives at home with husband. Pt tripped on the curb coming out of [employer] and has two lacerations on her head. Pt denies LOC, N/V, vision changes, neck/back pain and dizziness. Pt has c/o headache 4/10 pain. Pt receives dialysis M, W, F.

## 2014-11-23 ENCOUNTER — Encounter (HOSPITAL_COMMUNITY): Payer: Self-pay | Admitting: *Deleted

## 2014-11-23 ENCOUNTER — Emergency Department (HOSPITAL_COMMUNITY)
Admission: EM | Admit: 2014-11-23 | Discharge: 2014-11-23 | Disposition: A | Discharge status: Home / Self Care | Payer: BLUE CROSS/BLUE SHIELD | Attending: Emergency Medicine | Admitting: Emergency Medicine

## 2014-11-23 DIAGNOSIS — E119 Type 2 diabetes mellitus without complications: Secondary | ICD-10-CM | POA: Insufficient documentation

## 2014-11-23 DIAGNOSIS — Z4802 Encounter for removal of sutures: Secondary | ICD-10-CM | POA: Insufficient documentation

## 2014-11-23 DIAGNOSIS — Z79899 Other long term (current) drug therapy: Secondary | ICD-10-CM | POA: Insufficient documentation

## 2014-11-23 DIAGNOSIS — H5710 Ocular pain, unspecified eye: Secondary | ICD-10-CM | POA: Insufficient documentation

## 2014-11-23 DIAGNOSIS — W19XXXD Unspecified fall, subsequent encounter: Secondary | ICD-10-CM | POA: Insufficient documentation

## 2014-11-23 DIAGNOSIS — Z794 Long term (current) use of insulin: Secondary | ICD-10-CM | POA: Insufficient documentation

## 2014-11-23 DIAGNOSIS — R51 Headache: Secondary | ICD-10-CM | POA: Insufficient documentation

## 2014-11-23 DIAGNOSIS — Z7982 Long term (current) use of aspirin: Secondary | ICD-10-CM | POA: Insufficient documentation

## 2014-11-23 DIAGNOSIS — H9190 Unspecified hearing loss, unspecified ear: Secondary | ICD-10-CM | POA: Insufficient documentation

## 2014-11-23 DIAGNOSIS — Z8739 Personal history of other diseases of the musculoskeletal system and connective tissue: Secondary | ICD-10-CM | POA: Insufficient documentation

## 2014-11-23 DIAGNOSIS — S0181XD Laceration without foreign body of other part of head, subsequent encounter: Secondary | ICD-10-CM | POA: Insufficient documentation

## 2014-11-23 NOTE — Discharge Instructions (Signed)
Facial Laceration  A facial laceration is a cut on the face. These injuries can be painful and cause bleeding. Lacerations usually heal quickly, but they need special care to reduce scarring. DIAGNOSIS  Your health care provider will take a medical history, ask for details about how the injury occurred, and examine the wound to determine how deep the cut is. TREATMENT  Some facial lacerations may not require closure. Others may not be able to be closed because of an increased risk of infection. The risk of infection and the chance for successful closure will depend on various factors, including the amount of time since the injury occurred. The wound may be cleaned to help prevent infection. If closure is appropriate, pain medicines may be given if needed. Your health care provider will use stitches (sutures), wound glue (adhesive), or skin adhesive strips to repair the laceration. These tools bring the skin edges together to allow for faster healing and a better cosmetic outcome. If needed, you may also be given a tetanus shot. HOME CARE INSTRUCTIONS  Only take over-the-counter or prescription medicines as directed by your health care provider.  Follow your health care provider's instructions for wound care. These instructions will vary depending on the technique used for closing the wound. For Sutures:  Keep the wound clean and dry.   If you were given a bandage (dressing), you should change it at least once a day. Also change the dressing if it becomes wet or dirty, or as directed by your health care provider.   Wash the wound with soap and water 2 times a day. Rinse the wound off with water to remove all soap. Pat the wound dry with a clean towel.   After cleaning, apply a thin layer of the antibiotic ointment recommended by your health care provider. This will help prevent infection and keep the dressing from sticking.   You may shower as usual after the first 24 hours. Do not soak the  wound in water until the sutures are removed.   Get your sutures removed as directed by your health care provider. With facial lacerations, sutures should usually be taken out after 4-5 days to avoid stitch marks.   Wait a few days after your sutures are removed before applying any makeup. For Skin Adhesive Strips:  Keep the wound clean and dry.   Do not get the skin adhesive strips wet. You may bathe carefully, using caution to keep the wound dry.   If the wound gets wet, pat it dry with a clean towel.   Skin adhesive strips will fall off on their own. You may trim the strips as the wound heals. Do not remove skin adhesive strips that are still stuck to the wound. They will fall off in time.  For Wound Adhesive:  You may briefly wet your wound in the shower or bath. Do not soak or scrub the wound. Do not swim. Avoid periods of heavy sweating until the skin adhesive has fallen off on its own. After showering or bathing, gently pat the wound dry with a clean towel.   Do not apply liquid medicine, cream medicine, ointment medicine, or makeup to your wound while the skin adhesive is in place. This may loosen the film before your wound is healed.   If a dressing is placed over the wound, be careful not to apply tape directly over the skin adhesive. This may cause the adhesive to be pulled off before the wound is healed.   Avoid   prolonged exposure to sunlight or tanning lamps while the skin adhesive is in place.  The skin adhesive will usually remain in place for 5-10 days, then naturally fall off the skin. Do not pick at the adhesive film.  After Healing: Once the wound has healed, cover the wound with sunscreen during the day for 1 full year. This can help minimize scarring. Exposure to ultraviolet light in the first year will darken the scar. It can take 1-2 years for the scar to lose its redness and to heal completely.  SEEK IMMEDIATE MEDICAL CARE IF:  You have redness, pain, or  swelling around the wound.   You see ayellowish-white fluid (pus) coming from the wound.   You have chills or a fever.  MAKE SURE YOU:  Understand these instructions.  Will watch your condition.  Will get help right away if you are not doing well or get worse. Document Released: 12/10/2004 Document Revised: 08/23/2013 Document Reviewed: 06/15/2013 ExitCare Patient Information 2015 [employer]. This information is not intended to replace advice given to you by your health care provider. Make sure you discuss any questions you have with your health care provider.  

## 2014-11-23 NOTE — ED Notes (Signed)
Pt to get stiches removed.

## 2014-11-23 NOTE — ED Provider Notes (Signed)
CSN: [id number]     Arrival date & time 11/23/14  1955 History  This chart was scribed for [employer], PA-C, working with [employer]. Alvino Chapel, MD by Starleen Arms, ED Scribe. This patient was seen in room TR05C/TR05C and the patient's care was started at 8:44 PM.   Chief Complaint  Patient presents with  . Suture / Staple Removal   The history is provided by the patient. No language interpreter was used.   HPI Comments: [name] [name] is a 64 y.o. female who presents to the Emergency Department complaining of suture removal.  Patient was seen in ED on 1/3 after a mechanical fall where she received 11 sutures on her forehead.  She reports a mild lingering headache and eye soreness.  Patient denies discharge, bleeding, fever, chills, n/v.     Past Medical History  Diagnosis Date  . Hearing difficulty     Mild   . Diabetes mellitus without complication   . Night muscle spasms   . Bursitis   . Fatigue    Past Surgical History  Procedure Laterality Date  . 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 Esophagogastroduodenoscopy Left 02/20/2013    Procedure: ESOPHAGOGASTRODUODENOSCOPY (EGD);  Surgeon: Cleotis Nipper, MD;  Location: [facility] ENDOSCOPY;  Service: Endoscopy;  Laterality: Left;  . Av fistula placement Left 03/07/2013    Procedure: ARTERIOVENOUS (AV) FISTULA CREATION;  Surgeon: Elam Dutch, MD;  Location: [facility] OR;  Service: Vascular;  Laterality: Left;  left brachiocephalic AVF   Family History  Problem Relation Age of Onset  . Heart disease    . Diabetes    . Stroke    . Bone cancer Father     Of jaws   . Throat cancer Father    History  Substance Use Topics  . Smoking status: Never Smoker   . Smokeless tobacco: Never Used  . Alcohol Use: No   OB History    No data available     Review of Systems  Skin: Positive for wound.  Neurological: Positive for headaches.  All other systems reviewed and are negative.     Allergies  Aleve  and Motrin  Home Medications   Prior to Admission medications   Medication Sig Start Date End Date Taking? Authorizing Provider  aspirin 81 MG EC tablet Take 1 tablet (81 mg total) by mouth daily. 03/08/13   Kandis Nab, MD  calcium carbonate (OS-CAL - DOSED IN MG OF ELEMENTAL CALCIUM) 1250 MG tablet TAKE 1 TABLET BY MOUTH THREE TIMES DAILY WITH MEALS 07/15/13   Montez Morita, MD  cinacalcet (SENSIPAR) 60 MG tablet Take 60 mg by mouth daily.    Historical Provider, MD  DOCQLACE 100 MG capsule TAKE ONE CAPSULE BY MOUTH EVERY DAY AS NEEDED 12/10/13   Montez Morita, MD  insulin glargine (LANTUS) 100 UNIT/ML injection Inject 0.03 mLs (3 Units total) into the skin at bedtime. 07/12/14   Leone Haven, MD  metoCLOPramide (REGLAN) 5 MG tablet TAKE 1 TABLET BY MOUTH DAILY BEFORE BREAKFAST 02/20/14   Montez Morita, MD  multivitamin (RENA-VIT) TABS tablet Take 1 tablet by mouth at bedtime. 09/04/14   Leone Haven, MD  pantoprazole (PROTONIX) 40 MG tablet Take 1 tablet (40 mg total) by mouth daily. 02/20/14   Amber Fidel Levy, MD  promethazine (PHENERGAN) 25 MG tablet Take 1 tablet (25 mg total) by mouth every 6 (six) hours as needed for nausea. 02/20/14  Amber Fidel Levy, MD  venlafaxine XR (EFFEXOR-XR) 75 MG 24 hr capsule TAKE ONE CAPSULE BY MOUTH DAILY WITH BREAKFAST 02/20/14   Amber M Hairford, MD   BP 111/53 mmHg  Pulse 60  Temp(Src) 98.4 F (36.9 C) (Oral)  Resp 18  SpO2 96% Physical Exam  Constitutional: She is oriented to person, place, and time. She appears well-developed and well-nourished. No distress.  HENT:  Head: Normocephalic and atraumatic.  Eyes: Conjunctivae and EOM are normal.  Neck: Neck supple. No tracheal deviation present.  Cardiovascular: Normal rate.   Pulmonary/Chest: Effort normal. No respiratory distress.  Musculoskeletal: Normal range of motion.  Neurological: She is alert and oriented to person, place, and time.  Skin: Skin is warm and dry. No rash  noted. No erythema. No pallor.  11 sutures removed. 2 facial lacerations with 11 total blue Prolene sutures in place. Wounds are clean and dry with no active bleeding, discharge, dehiscence, or drainage. There is minimal tenderness palpation.  Psychiatric: She has a normal mood and affect. Her behavior is normal. Judgment and thought content normal.  Nursing note and vitals reviewed.   ED Course  SUTURE REMOVAL Date/Time: 11/23/2014 8:55 PM Performed by: Starlyn Skeans A Authorized by: Starlyn Skeans A Consent: Verbal consent obtained. Risks and benefits: risks, benefits and alternatives were discussed Consent given by: patient Patient understanding: patient states understanding of the procedure being performed Patient consent: the patient's understanding of the procedure matches consent given Procedure consent: procedure consent matches procedure scheduled Relevant documents: relevant documents present and verified Test results: test results available and properly labeled Site marked: the operative site was marked Imaging studies: imaging studies available Patient identity confirmed: verbally with patient Time out: Immediately prior to procedure a "time out" was called to verify the correct patient, procedure, equipment, support staff and site/side marked as required. Body area: head/neck Location details: forehead Wound Appearance: clean Sutures Removed: 11 Post-removal: antibiotic ointment applied Facility: sutures placed in this facility Patient tolerance: Patient tolerated the procedure well with no immediate complications   (including critical care time)  DIAGNOSTIC STUDIES: Oxygen Saturation is 95% on RA, adequate by my interpretation.    COORDINATION OF CARE:  8:49 PM Patient advised of return precautions including warmth, redness, drainage.  Patient advised she may continue to apply neosporin.  Patient acknowledges and agrees with plan.    Labs Review Labs  Reviewed - No data to display  Imaging Review No results found.   EKG Interpretation None      MDM   Final diagnoses:  Visit for suture removal  Facial laceration, subsequent encounter   Patient is a 64 year old female who presents emergency room for evaluation of suture removal for facial lacerations. Sutures were placed on 11/18/2014 here in this facility after a fall. Patient has had no drainage, discharge, fevers, nausea, vomiting. There is no evidence for on physical exam. Sutures were removed with no dehiscence, drainage, or any other concerning symptoms. I've told the patient to continue to watch for signs of infection. She states understanding and agreement at this time. Patient follow-up with her PCP as needed.   I personally performed the services described in this documentation, which was scribed in my presence. The recorded information has been reviewed and is accurate.    Cherylann Parr, PA-C 11/23/14 2056  Robertson Alvino Chapel, MD 11/24/14 [phone]

## 2015-02-04 ENCOUNTER — Other Ambulatory Visit: Payer: Self-pay | Admitting: *Deleted

## 2015-02-05 MED ORDER — DOCUSATE SODIUM 100 MG PO CAPS: 100.0000 mg | ORAL_CAPSULE | Freq: Every day | ORAL | Status: DC | PRN

## 2015-02-05 NOTE — Telephone Encounter (Signed)
Letter mailed to patient. Jailyn Langhorst,CMA  

## 2015-02-05 NOTE — Telephone Encounter (Signed)
Refill given. Patient needs follow-up for diabetes in the next 1-2 months. Thanks.

## 2015-03-06 ENCOUNTER — Other Ambulatory Visit: Payer: Self-pay | Admitting: *Deleted

## 2015-03-06 MED ORDER — PROMETHAZINE HCL 25 MG PO TABS: 25.0000 mg | ORAL_TABLET | Freq: Four times a day (QID) | ORAL | Status: DC | PRN

## 2015-03-06 MED ORDER — PANTOPRAZOLE SODIUM 40 MG PO TBEC: 40.0000 mg | DELAYED_RELEASE_TABLET | Freq: Every day | ORAL | Status: DC

## 2015-03-25 ENCOUNTER — Other Ambulatory Visit: Payer: Self-pay | Admitting: *Deleted

## 2015-03-25 MED ORDER — VENLAFAXINE HCL ER 75 MG PO CP24: ORAL_CAPSULE | ORAL | Status: DC

## 2015-03-25 NOTE — Telephone Encounter (Signed)
Refill given. Patient needs follow-up in the next one to two months. Please inform her of this. Thanks.

## 2015-03-26 NOTE — Telephone Encounter (Signed)
Letter mailed to patient. Elijan Googe,CMA  

## 2015-04-03 ENCOUNTER — Emergency Department (HOSPITAL_COMMUNITY)
Admission: EM | Admit: 2015-04-03 | Discharge: 2015-04-03 | Disposition: A | Discharge status: Home / Self Care | Payer: Medicare Other | Attending: Emergency Medicine | Admitting: Emergency Medicine

## 2015-04-03 ENCOUNTER — Encounter (HOSPITAL_COMMUNITY): Payer: Self-pay

## 2015-04-03 DIAGNOSIS — Y998 Other external cause status: Secondary | ICD-10-CM | POA: Diagnosis not present

## 2015-04-03 DIAGNOSIS — Y92811 Bus as the place of occurrence of the external cause: Secondary | ICD-10-CM | POA: Diagnosis not present

## 2015-04-03 DIAGNOSIS — Y9301 Activity, walking, marching and hiking: Secondary | ICD-10-CM | POA: Insufficient documentation

## 2015-04-03 DIAGNOSIS — W01198A Fall on same level from slipping, tripping and stumbling with subsequent striking against other object, initial encounter: Secondary | ICD-10-CM | POA: Insufficient documentation

## 2015-04-03 DIAGNOSIS — Z7982 Long term (current) use of aspirin: Secondary | ICD-10-CM | POA: Insufficient documentation

## 2015-04-03 DIAGNOSIS — Z8739 Personal history of other diseases of the musculoskeletal system and connective tissue: Secondary | ICD-10-CM | POA: Insufficient documentation

## 2015-04-03 DIAGNOSIS — S0990XA Unspecified injury of head, initial encounter: Secondary | ICD-10-CM | POA: Insufficient documentation

## 2015-04-03 DIAGNOSIS — W19XXXA Unspecified fall, initial encounter: Secondary | ICD-10-CM

## 2015-04-03 DIAGNOSIS — Z79899 Other long term (current) drug therapy: Secondary | ICD-10-CM | POA: Diagnosis not present

## 2015-04-03 DIAGNOSIS — Z794 Long term (current) use of insulin: Secondary | ICD-10-CM | POA: Diagnosis not present

## 2015-04-03 DIAGNOSIS — E119 Type 2 diabetes mellitus without complications: Secondary | ICD-10-CM | POA: Insufficient documentation

## 2015-04-03 DIAGNOSIS — H9190 Unspecified hearing loss, unspecified ear: Secondary | ICD-10-CM | POA: Insufficient documentation

## 2015-04-03 NOTE — ED Notes (Signed)
Pt tri[place] and fell [place] the [employer] [place] this [place], patient doesn't complain of any injury and says that the city made her come get checked.

## 2015-04-03 NOTE — Discharge Instructions (Signed)
Please follow directions provided. Be sure to follow-up in a few days with your primary care doctor to make sure you're getting better. You may use Tylenol as needed for pain or discomfort. Don't hesitate to return for any new, worsening, or concerning symptoms.   SEEK IMMEDIATE MEDICAL CARE IF:  You have pain that is getting worse and is not relieved by medications.  You develop chest pain that is associated with shortness or breath, sweating, feeling sick to your stomach (nauseous), or throw up (vomit).  Your pain becomes localized to the abdomen.  You develop any new symptoms that seem different or that concern you.

## 2015-04-03 NOTE — ED Provider Notes (Signed)
CSN: [id number]     Arrival date & time 04/03/15  1951 History  This chart was scribed for Britt Bottom, NP, working with Pamella Pert, MD by Steva Colder, ED Scribe. The patient was seen in room WTR5/WTR5 at 9:21 PM.    Chief Complaint  Patient presents with  . Fall      The history is provided by the patient. No language interpreter was used.    HPI Comments: [name] [name] is a 64 y.o. female who presents to the Emergency Department complaining of fall onset this afternoon.  [place] the [employer] [place] when she fell today. Pt was walking to her seat in the back of the bus and she tripped on the seatbelt straps that are attached to the floor of the bus. Pt denies being dizzy prior to falling. Pt fell and hit her head against one of the chairs. Pt states that the city informed her to come in. She states that she is having associated symptoms of HA and myalgia. She denies LOC, and any other symptoms. Pt is a dialysis pt.    Past Medical History  Diagnosis Date  . Hearing difficulty     Mild   . Diabetes mellitus without complication   . Night muscle spasms   . Bursitis   . Fatigue    Past Surgical History  Procedure Laterality Date  . 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 Esophagogastroduodenoscopy Left 02/20/2013    Procedure: ESOPHAGOGASTRODUODENOSCOPY (EGD);  Surgeon: Cleotis Nipper, MD;  Location: [facility] ENDOSCOPY;  Service: Endoscopy;  Laterality: Left;  . Av fistula placement Left 03/07/2013    Procedure: ARTERIOVENOUS (AV) FISTULA CREATION;  Surgeon: Elam Dutch, MD;  Location: [facility] OR;  Service: Vascular;  Laterality: Left;  left brachiocephalic AVF   Family History  Problem Relation Age of Onset  . Heart disease    . Diabetes    . Stroke    . Bone cancer Father     Of jaws   . Throat cancer Father    History  Substance Use Topics  . Smoking status: Never Smoker   . Smokeless tobacco: Never Used  . Alcohol Use: No   OB History     No data available     Review of Systems  Musculoskeletal: Positive for myalgias. Negative for back pain, joint swelling and arthralgias.  Neurological: Positive for headaches. Negative for dizziness, syncope, weakness, light-headedness and numbness.      Allergies  Aleve and Motrin  Home Medications   Prior to Admission medications   Medication Sig Start Date End Date Taking? Authorizing Provider  aspirin 81 MG EC tablet Take 1 tablet (81 mg total) by mouth daily. 03/08/13   Kandis Nab, MD  calcium carbonate (OS-CAL - DOSED IN MG OF ELEMENTAL CALCIUM) 1250 MG tablet TAKE 1 TABLET BY MOUTH THREE TIMES DAILY WITH MEALS 07/15/13   Montez Morita, MD  cinacalcet (SENSIPAR) 60 MG tablet Take 60 mg by mouth daily.    Historical Provider, MD  docusate sodium (DOCQLACE) 100 MG capsule Take 1 capsule (100 mg total) by mouth daily as needed. 02/05/15   Leone Haven, MD  insulin glargine (LANTUS) 100 UNIT/ML injection Inject 0.03 mLs (3 Units total) into the skin at bedtime. 07/12/14   Leone Haven, MD  metoCLOPramide (REGLAN) 5 MG tablet TAKE 1 TABLET BY MOUTH DAILY BEFORE BREAKFAST 02/20/14   Montez Morita, MD  multivitamin (RENA-VIT) TABS  tablet Take 1 tablet by mouth at bedtime. 09/04/14   Leone Haven, MD  pantoprazole (PROTONIX) 40 MG tablet Take 1 tablet (40 mg total) by mouth daily. 03/06/15   Leeanne Rio, MD  promethazine (PHENERGAN) 25 MG tablet Take 1 tablet (25 mg total) by mouth every 6 (six) hours as needed for nausea. 03/06/15   Leeanne Rio, MD  venlafaxine XR (EFFEXOR-XR) 75 MG 24 hr capsule TAKE ONE CAPSULE BY MOUTH DAILY WITH BREAKFAST 03/25/15   Leone Haven, MD   BP 140/97 mmHg  Pulse 59  Temp(Src) 97.8 F (36.6 C) (Oral)  Resp 20  SpO2 96%  Physical Exam  Constitutional: She is oriented to person, place, and time. She appears well-developed and well-nourished. No distress.  HENT:  Head: Normocephalic and atraumatic.  Eyes: EOM  are normal.  Neck: Neck supple. No tracheal deviation present.  Cardiovascular: Normal rate.   Pulmonary/Chest: Effort normal. No respiratory distress.  Musculoskeletal: Normal range of motion.  bilateral TTP to cervical paraspinous muscles. bilateral tenderness to trapezius muscles.  Neurological: She is alert and oriented to person, place, and time.  Cranial nerves 2-12 intact  Skin: Skin is warm and dry.   Intact dialysis dressing to left anterior upper arm.   Psychiatric: She has a normal mood and affect. Her behavior is normal.  Nursing note and vitals reviewed.   ED Course  Procedures (including critical care time) DIAGNOSTIC STUDIES: Oxygen Saturation is 96% on RA, nl by my interpretation.    COORDINATION OF CARE: 9:27 PM-Discussed treatment plan which includes f/u with PCP with pt at bedside and pt agreed to plan.   Labs Review Labs Reviewed - No data to display  Imaging Review No results found.   EKG Interpretation None      MDM   Final diagnoses:  Fall, initial encounter   64 yo with no complaints after fall on bus today.  She states she just lost her footing in th narrow aisle today but denies any dizziness or light-headedness before the fall.  She bumped her head on the padded seat but there was no LOC.  She reports coming only because she was encouraged to by the [place] bus driver and she knew her PCP would want her to.  She has no signs of injury, her neuro exam is normal and only mild complaints of headache and muscle soreness. She declines pain meds at this time. Pt is well-appearing, in no acute distress and vital signs reviewed and not concerning.She appears safe to be discharged.  Discharge include follow-up with her PCP.  Return precautions provided.  Pt aware of plan and in agreement.    I personally performed the services described in this documentation, which was scribed in my presence. The recorded information has been reviewed and is accurate.  Filed  Vitals:   04/03/15 2001  BP: 140/97  Pulse: 59  Temp: 97.8 F (36.6 C)  TempSrc: Oral  Resp: 20  SpO2: 96%   Meds given in ED:  Medications - No data to display  Discharge Medication List as of 04/03/2015  9:30 PM        Britt Bottom, NP 04/04/15 1646  Pamella Pert, MD 04/04/15 1715

## 2015-04-12 ENCOUNTER — Other Ambulatory Visit: Payer: Self-pay | Admitting: Family Medicine

## 2015-04-15 NOTE — Telephone Encounter (Signed)
Patient needs a follow-up for a refill on this medication. Please inform her of this. Thanks.

## 2015-04-16 NOTE — Telephone Encounter (Signed)
Mailed letter to pt today to schedule follow up appt for Promethazine refill. Keturah Yerby, CMA.

## 2015-04-22 ENCOUNTER — Encounter (HOSPITAL_COMMUNITY): Payer: Self-pay | Admitting: Emergency Medicine

## 2015-04-22 ENCOUNTER — Inpatient Hospital Stay (HOSPITAL_COMMUNITY)
Admission: EM | Admit: 2015-04-22 | Discharge: 2015-04-30 | DRG: 286 | Disposition: A | Discharge status: Skilled Nursing Facility | Payer: Medicare Other | Attending: Family Medicine | Admitting: Family Medicine

## 2015-04-22 ENCOUNTER — Emergency Department (HOSPITAL_COMMUNITY): Payer: Medicare Other

## 2015-04-22 DIAGNOSIS — R4020 Unspecified coma: Secondary | ICD-10-CM | POA: Diagnosis present

## 2015-04-22 DIAGNOSIS — D649 Anemia, unspecified: Secondary | ICD-10-CM | POA: Diagnosis present

## 2015-04-22 DIAGNOSIS — R55 Syncope and collapse: Secondary | ICD-10-CM | POA: Diagnosis not present

## 2015-04-22 DIAGNOSIS — W19XXXD Unspecified fall, subsequent encounter: Secondary | ICD-10-CM

## 2015-04-22 DIAGNOSIS — R001 Bradycardia, unspecified: Secondary | ICD-10-CM | POA: Diagnosis present

## 2015-04-22 DIAGNOSIS — E213 Hyperparathyroidism, unspecified: Secondary | ICD-10-CM | POA: Diagnosis present

## 2015-04-22 DIAGNOSIS — I2510 Atherosclerotic heart disease of native coronary artery without angina pectoris: Secondary | ICD-10-CM | POA: Diagnosis present

## 2015-04-22 DIAGNOSIS — I255 Ischemic cardiomyopathy: Secondary | ICD-10-CM | POA: Diagnosis present

## 2015-04-22 DIAGNOSIS — M170 Bilateral primary osteoarthritis of knee: Secondary | ICD-10-CM | POA: Diagnosis present

## 2015-04-22 DIAGNOSIS — R296 Repeated falls: Secondary | ICD-10-CM | POA: Diagnosis present

## 2015-04-22 DIAGNOSIS — N2581 Secondary hyperparathyroidism of renal origin: Secondary | ICD-10-CM | POA: Diagnosis present

## 2015-04-22 DIAGNOSIS — I120 Hypertensive chronic kidney disease with stage 5 chronic kidney disease or end stage renal disease: Secondary | ICD-10-CM | POA: Diagnosis present

## 2015-04-22 DIAGNOSIS — R51 Headache: Secondary | ICD-10-CM | POA: Diagnosis present

## 2015-04-22 DIAGNOSIS — I440 Atrioventricular block, first degree: Secondary | ICD-10-CM | POA: Diagnosis present

## 2015-04-22 DIAGNOSIS — F329 Major depressive disorder, single episode, unspecified: Secondary | ICD-10-CM | POA: Diagnosis present

## 2015-04-22 DIAGNOSIS — S0011XA Contusion of right eyelid and periocular area, initial encounter: Secondary | ICD-10-CM | POA: Diagnosis present

## 2015-04-22 DIAGNOSIS — S0083XA Contusion of other part of head, initial encounter: Secondary | ICD-10-CM | POA: Insufficient documentation

## 2015-04-22 DIAGNOSIS — H538 Other visual disturbances: Secondary | ICD-10-CM | POA: Diagnosis present

## 2015-04-22 DIAGNOSIS — Z79899 Other long term (current) drug therapy: Secondary | ICD-10-CM

## 2015-04-22 DIAGNOSIS — Z7982 Long term (current) use of aspirin: Secondary | ICD-10-CM

## 2015-04-22 DIAGNOSIS — G629 Polyneuropathy, unspecified: Secondary | ICD-10-CM | POA: Diagnosis present

## 2015-04-22 DIAGNOSIS — N186 End stage renal disease: Secondary | ICD-10-CM | POA: Diagnosis present

## 2015-04-22 DIAGNOSIS — Z794 Long term (current) use of insulin: Secondary | ICD-10-CM

## 2015-04-22 DIAGNOSIS — W19XXXA Unspecified fall, initial encounter: Secondary | ICD-10-CM | POA: Diagnosis present

## 2015-04-22 DIAGNOSIS — Z992 Dependence on renal dialysis: Secondary | ICD-10-CM

## 2015-04-22 DIAGNOSIS — E11339 Type 2 diabetes mellitus with moderate nonproliferative diabetic retinopathy without macular edema: Secondary | ICD-10-CM | POA: Diagnosis present

## 2015-04-22 DIAGNOSIS — E1122 Type 2 diabetes mellitus with diabetic chronic kidney disease: Secondary | ICD-10-CM | POA: Diagnosis present

## 2015-04-22 DIAGNOSIS — K219 Gastro-esophageal reflux disease without esophagitis: Secondary | ICD-10-CM | POA: Diagnosis present

## 2015-04-22 DIAGNOSIS — E119 Type 2 diabetes mellitus without complications: Secondary | ICD-10-CM

## 2015-04-22 HISTORY — DX: Dependence on renal dialysis: Z99.2

## 2015-04-22 HISTORY — DX: End stage renal disease: N18.6

## 2015-04-22 HISTORY — DX: Ischemic cardiomyopathy: I25.5

## 2015-04-22 HISTORY — DX: Syncope and collapse: R55

## 2015-04-22 HISTORY — DX: Atherosclerotic heart disease of native coronary artery without angina pectoris: I25.10

## 2015-04-22 LAB — BASIC METABOLIC PANEL
ANION GAP: 12 (ref 5–15)
BUN: 28 mg/dL — ABNORMAL HIGH (ref 6–20)
CO2: 27 mmol/L (ref 22–32)
CREATININE: 3.68 mg/dL — AB (ref 0.44–1.00)
Calcium: 8.2 mg/dL — ABNORMAL LOW (ref 8.9–10.3)
Chloride: 96 mmol/L — ABNORMAL LOW (ref 101–111)
GFR calc non Af Amer: 12 mL/min — ABNORMAL LOW (ref >60)
GFR, EST AFRICAN AMERICAN: 14 mL/min — AB (ref >60)
GLUCOSE: 204 mg/dL — AB (ref 65–99)
Potassium: 3.4 mmol/L — ABNORMAL LOW (ref 3.5–5.1)
SODIUM: 135 mmol/L (ref 135–145)

## 2015-04-22 LAB — CBC
HEMATOCRIT: 33.5 % — AB (ref 36.0–46.0)
Hemoglobin: 11.3 g/dL — ABNORMAL LOW (ref 12.0–15.0)
MCH: 33.5 pg (ref 26.0–34.0)
MCHC: 33.7 g/dL (ref 30.0–36.0)
MCV: 99.4 fL (ref 78.0–100.0)
Platelets: 105 10*3/uL — ABNORMAL LOW (ref 150–400)
RBC: 3.37 MIL/uL — ABNORMAL LOW (ref 3.87–5.11)
RDW: 13.8 % (ref 11.5–15.5)
WBC: 5.5 10*3/uL (ref 4.0–10.5)

## 2015-04-22 LAB — CBG MONITORING, ED: GLUCOSE-CAPILLARY: 191 mg/dL — AB (ref 65–99)

## 2015-04-22 NOTE — ED Notes (Signed)
Phlebotomy at the bedside  

## 2015-04-22 NOTE — ED Notes (Signed)
Spoke with CT, additional ct scans ordered while patient is in radiology per Wille Glaser, PA-C.

## 2015-04-22 NOTE — ED Notes (Signed)
Spoke with the husband Penney Domanski at [phone]. He will be home to let patient in if she is discharged.

## 2015-04-22 NOTE — ED Notes (Signed)
Pt had an unwitnessed fall outside of a restaurant, pt found prone by a bystander, EMS called. Pt has a hematoma to her right eye with a laceration to her right outside eye, bleeding controlled. Pt reports back pain and knee pain, also reports a hx of arthritis. Pt did dialyze today, no complications, dialyses MWF. EMS reports GCS 13.

## 2015-04-22 NOTE — ED Provider Notes (Signed)
CSN: [id number]     Arrival date & time 04/22/15  2112 History   First MD Initiated Contact with Patient 04/22/15 2145     Chief Complaint  Patient presents with  . Near Syncope  . Fall     (Consider location/radiation/quality/duration/timing/severity/associated sxs/prior Treatment) HPI Patient is a 64 year old female with past medical history of end-stage renal disease, on dialysis Monday/Wednesday/Friday diabetes, hearing difficulty presents the ER after an unwitnessed fall. Patient was brought in by EMS after a passerby found the patient prone in the parking lot. Patient reports she was eating at [place], and does not recall any of the events leading up to the family ground. Patient is your members paying for her meal, and only remembers being brought into the ER by EMS. Patient states she denies any complaints prior to this episode today. Patient has history of syncopal episodes. Currently patient complains of frontal right headache with associated hematoma. Patient denies dizziness, weakness, chest pain, shortness of breath, nausea, vomiting, abdominal pain, diarrhea, dysuria.  Past Medical History  Diagnosis Date  . Hearing difficulty     Mild   . Diabetes mellitus without complication   . Night muscle spasms   . Bursitis   . Fatigue    Past Surgical History  Procedure Laterality Date  . 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 Esophagogastroduodenoscopy Left 02/20/2013    Procedure: ESOPHAGOGASTRODUODENOSCOPY (EGD);  Surgeon: Cleotis Nipper, MD;  Location: [facility] ENDOSCOPY;  Service: Endoscopy;  Laterality: Left;  . Av fistula placement Left 03/07/2013    Procedure: ARTERIOVENOUS (AV) FISTULA CREATION;  Surgeon: Elam Dutch, MD;  Location: [facility] OR;  Service: Vascular;  Laterality: Left;  left brachiocephalic AVF   Family History  Problem Relation Age of Onset  . Heart disease    . Diabetes    . Stroke    . Bone cancer Father    Of jaws   . Throat cancer Father    History  Substance Use Topics  . Smoking status: Never Smoker   . Smokeless tobacco: Never Used  . Alcohol Use: No   OB History    No data available     Review of Systems  Constitutional: Negative for fever.  HENT: Positive for facial swelling. Negative for trouble swallowing.   Eyes: Negative for visual disturbance.  Respiratory: Negative for shortness of breath.   Cardiovascular: Negative for chest pain.  Gastrointestinal: Negative for nausea, vomiting and abdominal pain.  Genitourinary: Negative for dysuria.  Musculoskeletal: Positive for back pain and neck pain.  Skin: Negative for rash.  Neurological: Negative for dizziness, weakness and numbness.  Psychiatric/Behavioral: Negative.       Allergies  Aleve and Motrin  Home Medications   Prior to Admission medications   Medication Sig Start Date End Date Taking? Authorizing Provider  calcium carbonate (OS-CAL - DOSED IN MG OF ELEMENTAL CALCIUM) 1250 MG tablet TAKE 1 TABLET BY MOUTH THREE TIMES DAILY WITH MEALS 07/15/13  Yes Amber Fidel Levy, MD  docusate sodium (DOCQLACE) 100 MG capsule Take 1 capsule (100 mg total) by mouth daily as needed. 02/05/15  Yes Leone Haven, MD  doxycycline (VIBRA-TABS) 100 MG tablet Take 100 mg by mouth 2 (two) times daily. For 7 days   Yes Historical Provider, MD  insulin glargine (LANTUS) 100 UNIT/ML injection Inject 0.03 mLs (3 Units total) into the skin at bedtime. 07/12/14  Yes Leone Haven, MD  multivitamin (RENA-VIT) TABS tablet Take  1 tablet by mouth at bedtime. 09/04/14  Yes Leone Haven, MD  pantoprazole (PROTONIX) 40 MG tablet Take 1 tablet (40 mg total) by mouth daily. 03/06/15  Yes Leeanne Rio, MD  promethazine (PHENERGAN) 25 MG tablet Take 1 tablet (25 mg total) by mouth every 6 (six) hours as needed for nausea. 03/06/15  Yes Leeanne Rio, MD  sulfamethoxazole-trimethoprim (BACTRIM DS,SEPTRA DS) 800-160 MG per tablet  Take 1 tablet by mouth 2 (two) times daily. For 7 days   Yes Historical Provider, MD  venlafaxine XR (EFFEXOR-XR) 75 MG 24 hr capsule TAKE ONE CAPSULE BY MOUTH DAILY WITH BREAKFAST 03/25/15  Yes Leone Haven, MD  aspirin 81 MG EC tablet Take 1 tablet (81 mg total) by mouth daily. Patient not taking: Reported on 04/22/2015 03/08/13   Kandis Nab, MD  metoCLOPramide (REGLAN) 5 MG tablet TAKE 1 TABLET BY MOUTH DAILY BEFORE BREAKFAST Patient not taking: Reported on 04/22/2015 02/20/14   Montez Morita, MD   BP 136/75 mmHg  Pulse 57  Temp(Src) 98.2 F (36.8 C) (Oral)  Resp 18  SpO2 91% Physical Exam  Constitutional: She is oriented to person, place, and time. She appears well-developed and well-nourished. No distress.  HENT:  Head:    Right Ear: Tympanic membrane normal. No hemotympanum.  Left Ear: Tympanic membrane normal. No hemotympanum.  Nose: Nose normal.  Mouth/Throat: Uvula is midline and oropharynx is clear and moist. No oropharyngeal exudate.  Hematoma noted with associated skin tear to right eyelid.  Eyes: Right eye exhibits no discharge. Left eye exhibits no discharge. Left conjunctiva is not injected. Left conjunctiva has no hemorrhage. No scleral icterus. Left eye exhibits normal extraocular motion and no nystagmus. Left pupil is round and reactive.  Unable to fully assess right eye due to amount of swelling.  Neck:  Patient in cervical collar  Cardiovascular: Normal rate, regular rhythm and normal heart sounds.   No murmur heard. Pulmonary/Chest: Effort normal and breath sounds normal. No accessory muscle usage. No tachypnea. No respiratory distress.    Abdominal: Soft. There is no tenderness. There is no rigidity, no guarding, no tenderness at McBurney's point and negative Murphy's sign.  Musculoskeletal: Normal range of motion. She exhibits no edema or tenderness.       Legs: Neurological: She is alert and oriented to person, place, and time. No cranial nerve deficit  or sensory deficit. Coordination normal. GCS eye subscore is 4. GCS verbal subscore is 5. GCS motor subscore is 6.  Patient fully alert, answering questions appropriately in full, clear sentences. Cranial nerves II through XII grossly intact, however this evaluation is limited due to the swelling in patient's right eye. Motor strength 5 out of 5 in all major muscle groups of upper and lower extremities. Distal sensation intact.   Skin: Skin is warm and dry. No rash noted. She is not diaphoretic.  Psychiatric: She has a normal mood and affect.    ED Course  Procedures (including critical care time) Labs Review Labs Reviewed  CBC - Abnormal; Notable for the following:    RBC 3.37 (*)    Hemoglobin 11.3 (*)    HCT 33.5 (*)    Platelets 105 (*)    All other components within normal limits  BASIC METABOLIC PANEL - Abnormal; Notable for the following:    Potassium 3.4 (*)    Chloride 96 (*)    Glucose, Bld 204 (*)    BUN 28 (*)    Creatinine, Ser  3.68 (*)    Calcium 8.2 (*)    GFR calc non Af Amer 12 (*)    GFR calc Af Amer 14 (*)    All other components within normal limits  TROPONIN I - Abnormal; Notable for the following:    Troponin I 0.05 (*)    All other components within normal limits  CBG MONITORING, ED - Abnormal; Notable for the following:    Glucose-Capillary 191 (*)    All other components within normal limits    Imaging Review Dg Chest 2 View  04/23/2015   CLINICAL DATA:  Fall with right hip pain.  Shortness of breath.  EXAM: CHEST  2 VIEW  COMPARISON:  02/21/2013  FINDINGS: The heart is enlarged. Diffuse reticular opacities suggesting pulmonary edema. No confluent airspace disease. No pleural effusion. No pneumothorax. No acute osseous abnormalities are seen. Hyperdensity in the left upper abdomen is likely related to ingested material.  IMPRESSION: Cardiomegaly and pulmonary edema.  Correlation for CHF recommended.   Electronically Signed   By: Jeb Levering M.D.   On:  04/23/2015 00:05   Ct Head Wo Contrast  04/22/2015   CLINICAL DATA:  Unwitnessed fall with large right orbital hematoma, initial encounter  EXAM: CT HEAD WITHOUT CONTRAST  CT MAXILLOFACIAL WITHOUT CONTRAST  CT CERVICAL SPINE WITHOUT CONTRAST  TECHNIQUE: Multidetector CT imaging of the head, cervical spine, and maxillofacial structures were performed using the standard protocol without intravenous contrast. Multiplanar CT image reconstructions of the cervical spine and maxillofacial structures were also generated.  COMPARISON:  None.  FINDINGS: CT HEAD FINDINGS  Bony calvarium is intact. A large soft tissue hematoma is noted about the right orbit and right cheek consistent with the patient's recent injury. Mild atrophic changes are noted. No findings to suggest acute hemorrhage, acute infarction or space-occupying mass lesion are noted.  CT MAXILLOFACIAL FINDINGS  Significant motion artifact is identified limiting the exam. No definitive fracture is seen. Significant soft tissue swelling is again noted in the region of the right cheek and right orbit. A focal 2.5 cm hematoma with a fluid fluid level is noted just lateral to the right orbit. The orbits and their contents are within normal limits. Dental caries are noted. No other focal abnormality is seen.  CT CERVICAL SPINE FINDINGS  Seven cervical segments are well visualized. Multilevel disc space narrowing is noted with mild associated osteophytic changes. Facet hypertrophic changes are seen. No acute fracture or acute facet abnormality is noted. The surrounding soft tissues are within normal limits.  IMPRESSION: CT of the head: Atrophic changes without acute intracranial abnormality.  Significant right facial hematoma.  CT of the maxillofacial bones: Considerable right facial soft tissue changes consistent with a recent injury. A focal 2.5 cm hematoma is noted just lateral to the right orbit. No acute fracture is identified.  CT of the cervical spine:  Multilevel degenerative change without acute abnormality.   Electronically Signed   By: Inez Catalina M.D.   On: 04/22/2015 22:53   Ct Cervical Spine Wo Contrast  04/22/2015   CLINICAL DATA:  Unwitnessed fall with large right orbital hematoma, initial encounter  EXAM: CT HEAD WITHOUT CONTRAST  CT MAXILLOFACIAL WITHOUT CONTRAST  CT CERVICAL SPINE WITHOUT CONTRAST  TECHNIQUE: Multidetector CT imaging of the head, cervical spine, and maxillofacial structures were performed using the standard protocol without intravenous contrast. Multiplanar CT image reconstructions of the cervical spine and maxillofacial structures were also generated.  COMPARISON:  None.  FINDINGS: CT HEAD FINDINGS  Bony calvarium is intact. A large soft tissue hematoma is noted about the right orbit and right cheek consistent with the patient's recent injury. Mild atrophic changes are noted. No findings to suggest acute hemorrhage, acute infarction or space-occupying mass lesion are noted.  CT MAXILLOFACIAL FINDINGS  Significant motion artifact is identified limiting the exam. No definitive fracture is seen. Significant soft tissue swelling is again noted in the region of the right cheek and right orbit. A focal 2.5 cm hematoma with a fluid fluid level is noted just lateral to the right orbit. The orbits and their contents are within normal limits. Dental caries are noted. No other focal abnormality is seen.  CT CERVICAL SPINE FINDINGS  Seven cervical segments are well visualized. Multilevel disc space narrowing is noted with mild associated osteophytic changes. Facet hypertrophic changes are seen. No acute fracture or acute facet abnormality is noted. The surrounding soft tissues are within normal limits.  IMPRESSION: CT of the head: Atrophic changes without acute intracranial abnormality.  Significant right facial hematoma.  CT of the maxillofacial bones: Considerable right facial soft tissue changes consistent with a recent injury. A focal 2.5  cm hematoma is noted just lateral to the right orbit. No acute fracture is identified.  CT of the cervical spine: Multilevel degenerative change without acute abnormality.   Electronically Signed   By: Inez Catalina M.D.   On: 04/22/2015 22:53   Dg Hip Unilat With Pelvis 2-3 Views Right  04/23/2015   CLINICAL DATA:  Fall, RIGHT hip pain.  EXAM: RIGHT HIP (WITH PELVIS) 2-3 VIEWS  COMPARISON:  None.  FINDINGS: There is no evidence of hip fracture or dislocation. There is mild RIGHT hip joint space narrowing. No pelvic fracture. Fecal impaction.  IMPRESSION: No acute hip fracture is evident.  Degenerative change.   Electronically Signed   By: Rolla Flatten M.D.   On: 04/23/2015 00:08   Ct Maxillofacial Wo Cm  04/22/2015   CLINICAL DATA:  Unwitnessed fall with large right orbital hematoma, initial encounter  EXAM: CT HEAD WITHOUT CONTRAST  CT MAXILLOFACIAL WITHOUT CONTRAST  CT CERVICAL SPINE WITHOUT CONTRAST  TECHNIQUE: Multidetector CT imaging of the head, cervical spine, and maxillofacial structures were performed using the standard protocol without intravenous contrast. Multiplanar CT image reconstructions of the cervical spine and maxillofacial structures were also generated.  COMPARISON:  None.  FINDINGS: CT HEAD FINDINGS  Bony calvarium is intact. A large soft tissue hematoma is noted about the right orbit and right cheek consistent with the patient's recent injury. Mild atrophic changes are noted. No findings to suggest acute hemorrhage, acute infarction or space-occupying mass lesion are noted.  CT MAXILLOFACIAL FINDINGS  Significant motion artifact is identified limiting the exam. No definitive fracture is seen. Significant soft tissue swelling is again noted in the region of the right cheek and right orbit. A focal 2.5 cm hematoma with a fluid fluid level is noted just lateral to the right orbit. The orbits and their contents are within normal limits. Dental caries are noted. No other focal abnormality is  seen.  CT CERVICAL SPINE FINDINGS  Seven cervical segments are well visualized. Multilevel disc space narrowing is noted with mild associated osteophytic changes. Facet hypertrophic changes are seen. No acute fracture or acute facet abnormality is noted. The surrounding soft tissues are within normal limits.  IMPRESSION: CT of the head: Atrophic changes without acute intracranial abnormality.  Significant right facial hematoma.  CT of the maxillofacial bones: Considerable right facial soft tissue changes  consistent with a recent injury. A focal 2.5 cm hematoma is noted just lateral to the right orbit. No acute fracture is identified.  CT of the cervical spine: Multilevel degenerative change without acute abnormality.   Electronically Signed   By: Inez Catalina M.D.   On: 04/22/2015 22:53     EKG Interpretation   Date/Time:  Tuesday April 23 2015 00:56:40 EDT Ventricular Rate:  63 PR Interval:  221 QRS Duration: 105 QT Interval:  471 QTC Calculation: 482 R Axis:   -2 Text Interpretation:  Sinus rhythm Prolonged PR interval LVH with  secondary repolarization abnormality Confirmed by Dina Rich  MD, [place]  [phone]) on 04/23/2015 1:09:57 AM      MDM   Final diagnoses:  Fall  Fall    Unwitnessed fall outside of [facility]. Patient unable to recall the event. Large hematoma over right orbit with associated swelling. No discernible neurologic deficit. Left-sided chest wall tenderness. Right-sided hip pain no obvious swelling or injury to the chest wall or hip. Radiographic findings unremarkable for acute pathology other than soft tissue swelling of the patient's right orbit. No repairable laceration. Mild associated skin tear. EKG unremarkable for acute pathology. Troponin is mildly elevated at 0.05, this is not thought to be due to ACS, patient denying any chest pain, shortness of breath, dizziness, weakness, nausea, vomiting. With questionable loss of consciousness, and possible syncopal episode  given the patient's history of ESRD, no previous syncope in the past, we will admit patient to medicine for observation and further evaluation of loss of consciousness today. C-spine cleared by imaging. Patient afebrile, hemodynamically stable and in no acute distress. Neurologic exam is benign. The patient appears reasonably stabilized for admission considering the current resources, flow, and capabilities available in the ED at this time, and I doubt any other [facility] requiring further screening and/or treatment in the ED prior to admission.   BP 136/75 mmHg  Pulse 57  Temp(Src) 98.2 F (36.8 C) (Oral)  Resp 18  SpO2 91%  Signed,  Dahlia Bailiff, PA-C 1:39 AM  Patient discussed with Dr. Thayer Jew, MD  Dahlia Bailiff, PA-C 04/23/15 0140  Merryl Hacker, MD 04/23/15 [phone]

## 2015-04-23 ENCOUNTER — Other Ambulatory Visit: Payer: Self-pay

## 2015-04-23 DIAGNOSIS — N186 End stage renal disease: Secondary | ICD-10-CM

## 2015-04-23 DIAGNOSIS — R55 Syncope and collapse: Secondary | ICD-10-CM | POA: Diagnosis present

## 2015-04-23 DIAGNOSIS — R404 Transient alteration of awareness: Secondary | ICD-10-CM

## 2015-04-23 DIAGNOSIS — W19XXXA Unspecified fall, initial encounter: Secondary | ICD-10-CM | POA: Diagnosis not present

## 2015-04-23 DIAGNOSIS — S0083XA Contusion of other part of head, initial encounter: Secondary | ICD-10-CM

## 2015-04-23 DIAGNOSIS — E119 Type 2 diabetes mellitus without complications: Secondary | ICD-10-CM | POA: Diagnosis not present

## 2015-04-23 DIAGNOSIS — R4020 Unspecified coma: Secondary | ICD-10-CM | POA: Diagnosis present

## 2015-04-23 LAB — BRAIN NATRIURETIC PEPTIDE: B Natriuretic Peptide: 3990.5 pg/mL — ABNORMAL HIGH (ref 0.0–100.0)

## 2015-04-23 LAB — TROPONIN I
TROPONIN I: 0.04 ng/mL — AB (ref <0.031)
TROPONIN I: 0.05 ng/mL — AB (ref <0.031)
Troponin I: 0.05 ng/mL — ABNORMAL HIGH (ref <0.031)
Troponin I: 0.06 ng/mL — ABNORMAL HIGH (ref <0.031)
Troponin I: 0.06 ng/mL — ABNORMAL HIGH (ref <0.031)

## 2015-04-23 LAB — GLUCOSE, CAPILLARY
GLUCOSE-CAPILLARY: 135 mg/dL — AB (ref 65–99)
GLUCOSE-CAPILLARY: 131 mg/dL — AB (ref 65–99)
Glucose-Capillary: 108 mg/dL — ABNORMAL HIGH (ref 65–99)
Glucose-Capillary: 126 mg/dL — ABNORMAL HIGH (ref 65–99)
Glucose-Capillary: 103 mg/dL — ABNORMAL HIGH (ref 65–99)

## 2015-04-23 LAB — BASIC METABOLIC PANEL
Anion gap: 13 (ref 5–15)
BUN: 34 mg/dL — ABNORMAL HIGH (ref 6–20)
CHLORIDE: 95 mmol/L — AB (ref 101–111)
CO2: 26 mmol/L (ref 22–32)
Calcium: 8.4 mg/dL — ABNORMAL LOW (ref 8.9–10.3)
Creatinine, Ser: 4.67 mg/dL — ABNORMAL HIGH (ref 0.44–1.00)
GFR calc Af Amer: 10 mL/min — ABNORMAL LOW (ref >60)
GFR calc non Af Amer: 9 mL/min — ABNORMAL LOW (ref >60)
GLUCOSE: 145 mg/dL — AB (ref 65–99)
POTASSIUM: 3.2 mmol/L — AB (ref 3.5–5.1)
Sodium: 134 mmol/L — ABNORMAL LOW (ref 135–145)

## 2015-04-23 LAB — CBC
HCT: 32.9 % — ABNORMAL LOW (ref 36.0–46.0)
Hemoglobin: 11.2 g/dL — ABNORMAL LOW (ref 12.0–15.0)
MCH: 34.3 pg — ABNORMAL HIGH (ref 26.0–34.0)
MCHC: 34 g/dL (ref 30.0–36.0)
MCV: 100.6 fL — AB (ref 78.0–100.0)
Platelets: 125 10*3/uL — ABNORMAL LOW (ref 150–400)
RBC: 3.27 MIL/uL — ABNORMAL LOW (ref 3.87–5.11)
RDW: 13.9 % (ref 11.5–15.5)
WBC: 5.7 10*3/uL (ref 4.0–10.5)

## 2015-04-23 MED ORDER — DOCUSATE SODIUM 100 MG PO CAPS: 100.0000 mg | ORAL_CAPSULE | Freq: Every day | ORAL | Status: DC | PRN

## 2015-04-23 MED ORDER — PANTOPRAZOLE SODIUM 40 MG PO TBEC
40.0000 mg | DELAYED_RELEASE_TABLET | Freq: Every day | ORAL | Status: DC
  Administered 2015-04-23 – 2015-04-30 (×8): 40 mg via ORAL
  Filled 2015-04-23 (×8): qty 1

## 2015-04-23 MED ORDER — INSULIN ASPART 100 UNIT/ML ~~LOC~~ SOLN
0.0000 [IU] | Freq: Three times a day (TID) | SUBCUTANEOUS | Status: DC
Start: 2015-04-23 — End: 2015-04-30
  Administered 2015-04-23 – 2015-04-28 (×6): 1 [IU] via SUBCUTANEOUS
  Administered 2015-04-30: 2 [IU] via SUBCUTANEOUS

## 2015-04-23 MED ORDER — SODIUM CHLORIDE 0.9 % IJ SOLN: 3.0000 mL | INTRAMUSCULAR | Status: DC | PRN

## 2015-04-23 MED ORDER — CALCIUM CARBONATE 1250 (500 CA) MG PO TABS
1.0000 | ORAL_TABLET | Freq: Three times a day (TID) | ORAL | Status: DC
  Administered 2015-04-23 – 2015-04-30 (×19): 500 mg via ORAL
  Filled 2015-04-23 (×23): qty 1

## 2015-04-23 MED ORDER — ACETAMINOPHEN 650 MG RE SUPP: 650.0000 mg | Freq: Four times a day (QID) | RECTAL | Status: DC | PRN

## 2015-04-23 MED ORDER — SODIUM CHLORIDE 0.9 % IJ SOLN
3.0000 mL | Freq: Two times a day (BID) | INTRAMUSCULAR | Status: DC
  Administered 2015-04-23 – 2015-04-25 (×3): 3 mL via INTRAVENOUS

## 2015-04-23 MED ORDER — FENTANYL CITRATE (PF) 100 MCG/2ML IJ SOLN
50.0000 ug | Freq: Once | INTRAMUSCULAR | Status: AC
  Administered 2015-04-23: 50 ug via INTRAVENOUS
  Filled 2015-04-23: qty 2

## 2015-04-23 MED ORDER — ACETAMINOPHEN 325 MG PO TABS
650.0000 mg | ORAL_TABLET | Freq: Four times a day (QID) | ORAL | Status: DC | PRN
  Administered 2015-04-23 – 2015-04-27 (×7): 650 mg via ORAL
  Filled 2015-04-23 (×7): qty 2

## 2015-04-23 MED ORDER — PROMETHAZINE HCL 25 MG PO TABS
12.5000 mg | ORAL_TABLET | Freq: Four times a day (QID) | ORAL | Status: DC | PRN
  Administered 2015-04-23 – 2015-04-24 (×2): 12.5 mg via ORAL
  Filled 2015-04-23 (×2): qty 1

## 2015-04-23 MED ORDER — VENLAFAXINE HCL ER 75 MG PO CP24
75.0000 mg | ORAL_CAPSULE | Freq: Every day | ORAL | Status: DC
  Administered 2015-04-23 – 2015-04-25 (×3): 75 mg via ORAL
  Filled 2015-04-23 (×5): qty 1

## 2015-04-23 MED ORDER — SODIUM CHLORIDE 0.9 % IJ SOLN
3.0000 mL | Freq: Two times a day (BID) | INTRAMUSCULAR | Status: DC
  Administered 2015-04-23 – 2015-04-30 (×8): 3 mL via INTRAVENOUS

## 2015-04-23 MED ORDER — HEPARIN SODIUM (PORCINE) 5000 UNIT/ML IJ SOLN
5000.0000 [IU] | Freq: Three times a day (TID) | INTRAMUSCULAR | Status: DC
  Administered 2015-04-23 – 2015-04-30 (×20): 5000 [IU] via SUBCUTANEOUS
  Filled 2015-04-23 (×19): qty 1

## 2015-04-23 MED ORDER — SODIUM CHLORIDE 0.9 % IV SOLN: 250.0000 mL | INTRAVENOUS | Status: DC | PRN

## 2015-04-23 MED ORDER — RENA-VITE PO TABS
1.0000 | ORAL_TABLET | Freq: Every day | ORAL | Status: DC
  Administered 2015-04-23 – 2015-04-29 (×7): 1 via ORAL
  Filled 2015-04-23 (×7): qty 1

## 2015-04-23 NOTE — ED Notes (Signed)
Called husband for update as requested.

## 2015-04-23 NOTE — H&P (Signed)
[facility] Admission History and Physical Service Pager: [phone]  Patient name: [name] [name] Medical record number: [id number] Date of birth: 1951/05/10 Age: 64 y.o. Gender: female  Primary Care Provider: Tommi Rumps, MD Consultants: none Code Status: Full (confirmed on admission)  Chief Complaint: Syncopal episode  Assessment and Plan: VERALYN LOPP is a 64 y.o. female presenting with syncopal episode earlier today, unwitnessed reportedly LOC found by bystander called EMS. PMH is significant for ESRD (on HD MWF, s/p R-nephrectomy), DM2, OA knees, memory loss (fam h/o alzheimer's), GERD, depression.  # Syncopal episode with LOC, unwitnessed without residual symptoms - Improved Syncope yesterday evening, forward fall with facial injuries and eyelid hematoma. CT imaging head/neck negative for acute fracture or cranial hemorrhage. No known acute symptoms prior or post, without deficits upon awakening, just amnesia of event. Unclear etiology, without prior syncope before but significant recent history with multiple falls x 2 in past 6 month req ED visit and facial sutures, seemed to be falls related to tripping and no LOC or other symptoms. Multifactorial etiologies contributing to falls with chronic symptoms intermittent balance difficulty and dizziness, DOE, bilateral knee OA / pain and weakness, generalized debilitation/fatigue with chronic illness ESRD. Currently no focal neuro deficits or residual symptoms. - Admit to telemetry, observation for monitoring - Vitals per protocol, orthostatics in AM - Repeat EKG in AM - Cycle troponin-I x2 (initial 0.05, mildly elevated in ESRD) - Re-check labs in AM (BMET, CBC), had recent TSH 05/2014 normal, didn't repeat - Last ECHO 02/2013 (normal, LVEF 60-10%, no diastolic dysfunction), consider repeating - No prior h/o carotid dopplers, however no neuro deficits less likely for isolated syncope - PT/OT eval  #  ESRD, non-oliguric, on HD (M-W-F) - Followed by [facility], HD for last 1.5 yr, s/p R-nephrectomy, received last HD yesterday Mon 6/6. Not missed any recent HD - Access LUE-AVF - Renal diet, fluid restrict, Calcium carbonate - Monitor I/O, BPs, labs, volume status, likely follow-up with normal HD outpatient Weds or inpatient if still admit  # DM2, controlled - Last A1c 6.1 (05/2014). On Lantus 3u qhs at home. - Re-check A1c in AM - Hold Lantus currently, consider resuming if remains inpatient - Sensitive SSI, check CBG q AC hs  # OA Knees, bilateral, chronic - Currently controoled, without any pain  # Memory loss, chronic, stable - Family h/o Alzheimer's dementia. Prev work-up for reversible etiologies neg. MMSE 27/30 on 07/12/14  # GERD - Continue home PPI  # Lower ext superficial blister, ?cellulitis - Afebrile, no WBCs - Recently treated outpatient HD with Doxy and Bactrim for superficial skin ulceration, not impressive on exam and do not see significant findings. - Hold antibiotics at this time  # H/o Depression - Continue Venlafaxine-XR 75mg  daily  FEN/GI: SLIV / Renal / carb mod diet with fluid restriction. Renal-MVI Prophylaxis: Heparin SQ  Disposition: Admit to telemetry for observation overnight for syncopal episode, if stable and work-up negative, may be able to DC to home today 04/23/15.  History of Present Illness:  History per patient and EDP report.   [name] [name] is a 64 y.o. female presenting with syncopal episode on Mon 6/6 evening. She has little memory of this episode, but does remember eating at [facility], then leaving and walking on sidewalk, and then next thing she remembers is waking up in ambulance on way to hospital. Reportedly a bystander found her down face first on side-walk with facial laceration and bleeding, sounded  like her fall was unwitnessed. Denies any symptoms prior to this event acutely and no significant symptoms  following. Does admit to multiple generalized symptoms ongoing for past several months with intermittent episodes with loss of balance, dizziness, generalized fatigue and feeling "tired" constantly. - Admits some DOE (significant exertion), recurrent pain in bilateral knees with h/o OA - Denies any fevers/shaking chills, SOB at rest or minimal activity, lightheadedness / dizziness currently, abdominal pain, n/v, dysuria, decreased vision, weakness, numbness / tingling  Multiple recent falls without reported LOC or other associated symptoms, previously with ED visits 04/03/15 - (seemed to be provoked by losing footing on [employer] bus, no LOC, no complaints other than mild HA but advised to go to ED, no injuries and normal neuro exam, no imaging performed, discharged with reassurance), prior on 11/18/14 for fall with facial lacs s/p suture repair, "mechanical fall" slipped after stepping off curb, no preceding symptoms, no neuro deficits, no imaging obtained.  ESRD on HD, denies any missed HD treatments recently. She did receive HD yesterday Mon 6/6.  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 Significant life stressors with her older sister recently dx with ovarian CA.  In ED, found to have large hematoma with R eyelid swelling and ecchymosis. No focal neuro deficits, she was awake, alert, oriented in ED. X-rays bilateral hips negative. CT Head, Maxillofacial, C-spine negative for acute fractures, with 2.5 cm hematoma lateral to R-orbit. Trop 0.05, EKG without acute changes.  Review Of Systems: Per HPI with the following additions: none Otherwise 12 point review of systems was performed and was unremarkable.  Patient Active Problem List   Diagnosis Date Noted  . Loss of consciousness 04/23/2015  . Syncope 04/23/2015  . Moderate nonproliferative diabetic retinopathy(362.05) 07/16/2014  . Other  malaise and fatigue 06/12/2014  . Memory loss 06/12/2014  . Right knee pain 02/22/2014  . Trouble in sleeping 02/22/2014  . End stage renal disease 03/16/2013  . Aftercare following surgery of the circulatory system, [place] 03/16/2013  . Nausea and vomiting 02/14/2013  . Anemia 02/14/2013  . Depression 08/28/2011  . Diabetes mellitus type 2 in nonobese 04/18/2011  . Constipation 04/18/2011  . Low blood pressure 04/18/2011   Past Medical History: Past Medical History  Diagnosis Date  . Hearing difficulty     Mild   . Diabetes mellitus without complication   . Night muscle spasms   . Bursitis   . Fatigue    Past Surgical History: Past Surgical History  Procedure Laterality Date  . 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 Esophagogastroduodenoscopy Left 02/20/2013    Procedure: ESOPHAGOGASTRODUODENOSCOPY (EGD);  Surgeon: Cleotis Nipper, MD;  Location: [facility] ENDOSCOPY;  Service: Endoscopy;  Laterality: Left;  . Av fistula placement Left 03/07/2013    Procedure: ARTERIOVENOUS (AV) FISTULA CREATION;  Surgeon: Elam Dutch, MD;  Location: [facility] OR;  Service: Vascular;  Laterality: Left;  left brachiocephalic AVF   Social History: History  Substance Use Topics  . Smoking status: Never Smoker   . Smokeless tobacco: Never Used  . Alcohol Use: No   Additional social history: none  Please also refer to relevant sections of EMR.  Family History: Family History  Problem Relation Age of Onset  . Heart disease    . Diabetes    . Stroke    . Bone cancer Father     Of jaws   .  Throat cancer Father    Allergies and Medications: Allergies  Allergen Reactions  . Aleve [Naproxen Sodium]     Due to kidneys.  . Motrin [Ibuprofen]     Due to kidneys.   No current facility-administered medications on file prior to encounter.   Current Outpatient Prescriptions on File Prior to Encounter  Medication Sig Dispense Refill  . calcium carbonate (OS-CAL - DOSED IN MG  OF ELEMENTAL CALCIUM) 1250 MG tablet TAKE 1 TABLET BY MOUTH THREE TIMES DAILY WITH MEALS 100 tablet 0  . docusate sodium (DOCQLACE) 100 MG capsule Take 1 capsule (100 mg total) by mouth daily as needed. 30 capsule 3  . insulin glargine (LANTUS) 100 UNIT/ML injection Inject 0.03 mLs (3 Units total) into the skin at bedtime. 10 mL 6  . multivitamin (RENA-VIT) TABS tablet Take 1 tablet by mouth at bedtime. 90 tablet 3  . pantoprazole (PROTONIX) 40 MG tablet Take 1 tablet (40 mg total) by mouth daily. 30 tablet 1  . promethazine (PHENERGAN) 25 MG tablet Take 1 tablet (25 mg total) by mouth every 6 (six) hours as needed for nausea. 30 tablet 1  . venlafaxine XR (EFFEXOR-XR) 75 MG 24 hr capsule TAKE ONE CAPSULE BY MOUTH DAILY WITH BREAKFAST 30 capsule 1  . aspirin 81 MG EC tablet Take 1 tablet (81 mg total) by mouth daily. (Patient not taking: Reported on 04/22/2015) 30 tablet 0  . metoCLOPramide (REGLAN) 5 MG tablet TAKE 1 TABLET BY MOUTH DAILY BEFORE BREAKFAST (Patient not taking: Reported on 04/22/2015) 30 tablet 11    Objective: BP 150/77 mmHg  Pulse 62  Temp(Src) 97.5 F (36.4 C) (Oral)  Resp 18  SpO2 98% Exam: General: resting in bed, comfortable, has icepack over R-eye, NAD Eyes: Right upper/lower eyelids swollen almost shut with ecchymosis/hematoma, left eye PERRL, EOMI HENTM: Right facial superficial laceration with hemostasis lateral to R-orbit, otherwise no other scalp injuries identified. Nares patent and clear. Oropharynx clear. MMM Neck: supple, non-tender, normal ROM Cardiovascular: RRR, no murmurs Respiratory: CTAB. No focal crackles, wheezing. Good air movement. Non-labored. Abdomen: Soft, NTND, +active BS MSK: Bilateral knee tenderness without effusion or erythema Ext: LUE AVF, bruit heard with palpable vibration Skin: warm, dry, multiple skin abrasions bilateral lower legs with scabbed healing wounds, no evidence of infection (without erythema, abscess, drainage or bleeding).  Darked pigment across bilateral lower ext with chronic venous changes and woody edema non-pitting Neuro: Awake, alert, oriented (person, place, time), grossly non-focal CN-II-XII, muscle str intact equally b/l grip 5/5, ankle dorsiflexion 5/5, no arm or leg drift, sensation intact bilaterally distal.  Psych: normal mood and affect, normal speech  Labs and Imaging: CBC BMET   Recent Labs Lab 04/22/15 2147  WBC 5.5  HGB 11.3*  HCT 33.5*  PLT 105*    Recent Labs Lab 04/22/15 2147  NA 135  K 3.4*  CL 96*  CO2 27  BUN 28*  CREATININE 3.68*  GLUCOSE 204*  CALCIUM 8.2*     CBG 135  Troponin-I - 0.05 >>  6/6 CT Head w/o contrast / Maxillofacial / C-spine IMPRESSION:  CT of the head: Atrophic changes without acute intracranial  abnormality.  Significant right facial hematoma.  CT of the maxillofacial bones: Considerable right facial soft tissue  changes consistent with a recent injury. A focal 2.5 cm hematoma is  noted just lateral to the right orbit. No acute fracture is  identified.  CT of the cervical spine: Multilevel degenerative change without  acute abnormality.  6/6  Bilateral Hip Pelvis X-ray IMPRESSION: No acute hip fracture is evident. Degenerative change.  6/6 CXR 2v IMPRESSION: Cardiomegaly and pulmonary edema. Correlation for CHF recommended.  6/7 EKG NSR 63, prolonged PR vs type 1 heart block, no acute ST-T wave changes, compared to 2014.  Olin Hauser, DO 04/23/2015, 3:17 AM PGY-2, [place] Intern pager: [phone], text pages welcome

## 2015-04-23 NOTE — Progress Notes (Signed)
CBG: 131. RN notified.

## 2015-04-23 NOTE — ED Notes (Signed)
Spoke to [employer], in regards to plan of care. Collar removed, planning for admission.

## 2015-04-23 NOTE — Progress Notes (Signed)
Family Medicine Teaching Service Daily Progress Note Intern Pager: [phone]  Patient name: JENNET SCROGGIN Medical record number: [id number] Date of birth: 04/10/1951 Age: 64 y.o. Gender: female  Primary Care Provider: Tommi Rumps, MD Consultants: none Code Status: Full  Pt Overview and Major Events to Date:  06/07: admitted  Assessment and Plan: MARJA ADDERLEY is a 64 y.o. female presenting with syncopal episode earlier today, unwitnessed reportedly LOC found by bystander called EMS. PMH is significant for ESRD (on HD MWF, s/p R-nephrectomy), DM2, OA knees, memory loss (fam h/o alzheimer's), GERD, depression.  # Syncopal episode with LOC, unwitnessed without residual symptoms - Improved CT imaging head/neck negative for acute fracture or cranial hemorrhage. No known acute symptoms prior or post, without deficits upon awakening, just amnesia of event. Unclear etiology, without prior syncope before but significant recent history with multiple falls x 2 in past 6 month req ED visit and facial sutures, seemed to be falls related to tripping and no LOC or other symptoms. Multifactorial etiologies contributing to falls with chronic symptoms intermittent balance difficulty and dizziness, DOE, bilateral knee OA / pain and weakness, generalized debilitation/fatigue with chronic illness ESRD. Repeat EKG with nonspecific t wave abnormality.  Trop 0.05>0.04>0.06. Mildly elevated in ESRD). Had recent TSH 05/2014 normal, didn't repeat.  Last ECHO 02/2013 (normal, LVEF 11-94%, no diastolic dysfunction), BNP 3990.5 (could be increased 2/2 ESRD) - telemetry, observation for monitoring - Vitals per protocol, orthostatics positive - repeat ECHO pending. - No prior h/o carotid dopplers, however no neuro deficits less likely for isolated syncope - PT/OT eval - will order stockings and continue to reinforce to patient to change positions very slowly.  # ESRD, non-oliguric, on HD (M-W-F) Followed by [facility], HD for last 1.5 yr, s/p R-nephrectomy, received last HD yesterday Mon 6/6. Not missed any recent HD - Access LUE-AVF - Renal diet, fluid restrict, Calcium carbonate - Monitor I/O, BPs, labs, volume status, likely follow-up with normal HD outpatient Weds or inpatient if still admit - Discussed patient's case with Dr Deterding, nephrologist.  Discussed possible use of Midodrine vs taking less fluid off at HD.  He reports to me that patient has had ongoing issues with LOC/falls even on nonHD days.  He also states that patient also often comes in fluid overloaded and that she is already getting reduced amounts of fluid taken of in the setting of orthostasis.  Also reports patient's BPs are usually very high at HD, so midodrine likely not a good choice.  Patient apparently has ambulatory devices at home but does not like to use them.  Recommends pressure stockings at this time.  Appreciate time and assistance by renal.    # DM2, controlled  Last A1c 6.1 (05/2014). On Lantus 3u qhs at home. - A1c pending - Hold Lantus currently, consider resuming if remains inpatient - Sensitive SSI, check CBG q AC hs  # OA Knees, bilateral, chronic - Currently controoled, without any pain  # Memory loss, chronic, stable - Family h/o Alzheimer's dementia. Prev work-up for reversible etiologies neg. MMSE 27/30 on 07/12/14  # GERD - Continue home PPI  # Lower ext superficial blister, ?cellulitis  Afebrile, no WBCs - Recently treated outpatient HD with Doxy and Bactrim for superficial skin ulceration, not impressive on exam and do not see significant findings. - Hold antibiotics at this time  # H/o Depression - Continue Venlafaxine-XR 75mg  daily  FEN/GI: SLIV / Renal / carb mod diet with fluid restriction. Renal-MVI Prophylaxis: Heparin  SQ  Disposition: Admitted to telemetry for observation overnight for syncopal episode, if stable and work-up negative, may be able to DC to home today  04/23/15.  Subjective:  Patient reports that she is feeling well this morning.  She reports that she did not remember the fall yesterday.  She has fallen in the past and she remembers all of those.  Denies dizziness, CP, SOB, nausea this am.  She does endorse some discomfort around her R eye, where she was injured.  She voices good understanding of today's plan.  Objective: Temp:  [97.5 F (36.4 C)-98.2 F (36.8 C)] 98.1 F (36.7 C) (06/07 0504) Pulse Rate:  [57-83] 64 (06/07 0504) Resp:  [11-20] 18 (06/07 0300) BP: (118-154)/(52-99) 122/79 mmHg (06/07 0504) SpO2:  [91 %-100 %] 100 % (06/07 0504) Weight:  [191 lb 8 oz (86.864 kg)] 191 lb 8 oz (86.864 kg) (06/07 0525) Physical Exam: General: awake, alert, NAD HEENT: R eye with marked edema and ecchymosis, +TTP, patient not able to open R eye 2/2 swelling Cardiovascular: RRR, no murmurs Respiratory: CTAB, no increased WOB Abdomen: soft, NT/ND, +BS Extremities: WWP, LUE with fistula w/ palpable thrill, +2 pedal edema Neuro: AOx3, no focal deficits (ocular exam not able to be performed 2/2 eye swelling) Psych: normal speech, mood stable  Laboratory:  Recent Labs Lab 04/22/15 2147 04/23/15 0422  WBC 5.5 5.7  HGB 11.3* 11.2*  HCT 33.5* 32.9*  PLT 105* 125*    Recent Labs Lab 04/22/15 2147 04/23/15 1020  NA 135 134*  K 3.4* 3.2*  CL 96* 95*  CO2 27 26  BUN 28* 34*  CREATININE 3.68* 4.67*  CALCIUM 8.2* 8.4*  GLUCOSE 204* 145*    Imaging/Diagnostic Tests: Dg Chest 2 View  04/23/2015   CLINICAL DATA:  Fall with right hip pain.  Shortness of breath.  EXAM: CHEST  2 VIEW  COMPARISON:  02/21/2013  FINDINGS: The heart is enlarged. Diffuse reticular opacities suggesting pulmonary edema. No confluent airspace disease. No pleural effusion. No pneumothorax. No acute osseous abnormalities are seen. Hyperdensity in the left upper abdomen is likely related to ingested material.  IMPRESSION: Cardiomegaly and pulmonary edema.  Correlation  for CHF recommended.   Electronically Signed   By: Jeb Levering M.D.   On: 04/23/2015 00:05   Ct Head Wo Contrast  04/22/2015   CLINICAL DATA:  Unwitnessed fall with large right orbital hematoma, initial encounter  EXAM: CT HEAD WITHOUT CONTRAST  CT MAXILLOFACIAL WITHOUT CONTRAST  CT CERVICAL SPINE WITHOUT CONTRAST  TECHNIQUE: Multidetector CT imaging of the head, cervical spine, and maxillofacial structures were performed using the standard protocol without intravenous contrast. Multiplanar CT image reconstructions of the cervical spine and maxillofacial structures were also generated.  COMPARISON:  None.  FINDINGS: CT HEAD FINDINGS  Bony calvarium is intact. A large soft tissue hematoma is noted about the right orbit and right cheek consistent with the patient's recent injury. Mild atrophic changes are noted. No findings to suggest acute hemorrhage, acute infarction or space-occupying mass lesion are noted.  CT MAXILLOFACIAL FINDINGS  Significant motion artifact is identified limiting the exam. No definitive fracture is seen. Significant soft tissue swelling is again noted in the region of the right cheek and right orbit. A focal 2.5 cm hematoma with a fluid fluid level is noted just lateral to the right orbit. The orbits and their contents are within normal limits. Dental caries are noted. No other focal abnormality is seen.  CT CERVICAL SPINE FINDINGS  Seven cervical  segments are well visualized. Multilevel disc space narrowing is noted with mild associated osteophytic changes. Facet hypertrophic changes are seen. No acute fracture or acute facet abnormality is noted. The surrounding soft tissues are within normal limits.  IMPRESSION: CT of the head: Atrophic changes without acute intracranial abnormality.  Significant right facial hematoma.  CT of the maxillofacial bones: Considerable right facial soft tissue changes consistent with a recent injury. A focal 2.5 cm hematoma is noted just lateral to the  right orbit. No acute fracture is identified.  CT of the cervical spine: Multilevel degenerative change without acute abnormality.   Electronically Signed   By: Inez Catalina M.D.   On: 04/22/2015 22:53   Ct Cervical Spine Wo Contrast  04/22/2015   CLINICAL DATA:  Unwitnessed fall with large right orbital hematoma, initial encounter  EXAM: CT HEAD WITHOUT CONTRAST  CT MAXILLOFACIAL WITHOUT CONTRAST  CT CERVICAL SPINE WITHOUT CONTRAST  TECHNIQUE: Multidetector CT imaging of the head, cervical spine, and maxillofacial structures were performed using the standard protocol without intravenous contrast. Multiplanar CT image reconstructions of the cervical spine and maxillofacial structures were also generated.  COMPARISON:  None.  FINDINGS: CT HEAD FINDINGS  Bony calvarium is intact. A large soft tissue hematoma is noted about the right orbit and right cheek consistent with the patient's recent injury. Mild atrophic changes are noted. No findings to suggest acute hemorrhage, acute infarction or space-occupying mass lesion are noted.  CT MAXILLOFACIAL FINDINGS  Significant motion artifact is identified limiting the exam. No definitive fracture is seen. Significant soft tissue swelling is again noted in the region of the right cheek and right orbit. A focal 2.5 cm hematoma with a fluid fluid level is noted just lateral to the right orbit. The orbits and their contents are within normal limits. Dental caries are noted. No other focal abnormality is seen.  CT CERVICAL SPINE FINDINGS  Seven cervical segments are well visualized. Multilevel disc space narrowing is noted with mild associated osteophytic changes. Facet hypertrophic changes are seen. No acute fracture or acute facet abnormality is noted. The surrounding soft tissues are within normal limits.  IMPRESSION: CT of the head: Atrophic changes without acute intracranial abnormality.  Significant right facial hematoma.  CT of the maxillofacial bones: Considerable right  facial soft tissue changes consistent with a recent injury. A focal 2.5 cm hematoma is noted just lateral to the right orbit. No acute fracture is identified.  CT of the cervical spine: Multilevel degenerative change without acute abnormality.   Electronically Signed   By: Inez Catalina M.D.   On: 04/22/2015 22:53   Dg Hip Unilat With Pelvis 2-3 Views Right  04/23/2015   CLINICAL DATA:  Fall, RIGHT hip pain.  EXAM: RIGHT HIP (WITH PELVIS) 2-3 VIEWS  COMPARISON:  None.  FINDINGS: There is no evidence of hip fracture or dislocation. There is mild RIGHT hip joint space narrowing. No pelvic fracture. Fecal impaction.  IMPRESSION: No acute hip fracture is evident.  Degenerative change.   Electronically Signed   By: Rolla Flatten M.D.   On: 04/23/2015 00:08   Ct Maxillofacial Wo Cm  04/22/2015   CLINICAL DATA:  Unwitnessed fall with large right orbital hematoma, initial encounter  EXAM: CT HEAD WITHOUT CONTRAST  CT MAXILLOFACIAL WITHOUT CONTRAST  CT CERVICAL SPINE WITHOUT CONTRAST  TECHNIQUE: Multidetector CT imaging of the head, cervical spine, and maxillofacial structures were performed using the standard protocol without intravenous contrast. Multiplanar CT image reconstructions of the cervical spine and maxillofacial  structures were also generated.  COMPARISON:  None.  FINDINGS: CT HEAD FINDINGS  Bony calvarium is intact. A large soft tissue hematoma is noted about the right orbit and right cheek consistent with the patient's recent injury. Mild atrophic changes are noted. No findings to suggest acute hemorrhage, acute infarction or space-occupying mass lesion are noted.  CT MAXILLOFACIAL FINDINGS  Significant motion artifact is identified limiting the exam. No definitive fracture is seen. Significant soft tissue swelling is again noted in the region of the right cheek and right orbit. A focal 2.5 cm hematoma with a fluid fluid level is noted just lateral to the right orbit. The orbits and their contents are within  normal limits. Dental caries are noted. No other focal abnormality is seen.  CT CERVICAL SPINE FINDINGS  Seven cervical segments are well visualized. Multilevel disc space narrowing is noted with mild associated osteophytic changes. Facet hypertrophic changes are seen. No acute fracture or acute facet abnormality is noted. The surrounding soft tissues are within normal limits.  IMPRESSION: CT of the head: Atrophic changes without acute intracranial abnormality.  Significant right facial hematoma.  CT of the maxillofacial bones: Considerable right facial soft tissue changes consistent with a recent injury. A focal 2.5 cm hematoma is noted just lateral to the right orbit. No acute fracture is identified.  CT of the cervical spine: Multilevel degenerative change without acute abnormality.   Electronically Signed   By: Inez Catalina M.D.   On: 04/22/2015 22:53   Janora Norlander, DO 04/23/2015, 1:37 PM PGY-1, [place] Intern pager: [phone], text pages welcome

## 2015-04-24 ENCOUNTER — Observation Stay (HOSPITAL_COMMUNITY): Payer: Medicare Other

## 2015-04-24 DIAGNOSIS — R296 Repeated falls: Secondary | ICD-10-CM | POA: Diagnosis present

## 2015-04-24 DIAGNOSIS — R001 Bradycardia, unspecified: Secondary | ICD-10-CM | POA: Diagnosis present

## 2015-04-24 DIAGNOSIS — W19XXXD Unspecified fall, subsequent encounter: Secondary | ICD-10-CM | POA: Diagnosis not present

## 2015-04-24 DIAGNOSIS — R55 Syncope and collapse: Secondary | ICD-10-CM | POA: Diagnosis present

## 2015-04-24 DIAGNOSIS — F329 Major depressive disorder, single episode, unspecified: Secondary | ICD-10-CM | POA: Diagnosis present

## 2015-04-24 DIAGNOSIS — S0011XA Contusion of right eyelid and periocular area, initial encounter: Secondary | ICD-10-CM | POA: Diagnosis present

## 2015-04-24 DIAGNOSIS — R51 Headache: Secondary | ICD-10-CM | POA: Diagnosis present

## 2015-04-24 DIAGNOSIS — Z794 Long term (current) use of insulin: Secondary | ICD-10-CM | POA: Diagnosis not present

## 2015-04-24 DIAGNOSIS — K219 Gastro-esophageal reflux disease without esophagitis: Secondary | ICD-10-CM | POA: Diagnosis present

## 2015-04-24 DIAGNOSIS — I120 Hypertensive chronic kidney disease with stage 5 chronic kidney disease or end stage renal disease: Secondary | ICD-10-CM | POA: Diagnosis present

## 2015-04-24 DIAGNOSIS — I440 Atrioventricular block, first degree: Secondary | ICD-10-CM | POA: Diagnosis present

## 2015-04-24 DIAGNOSIS — I255 Ischemic cardiomyopathy: Secondary | ICD-10-CM | POA: Diagnosis not present

## 2015-04-24 DIAGNOSIS — G629 Polyneuropathy, unspecified: Secondary | ICD-10-CM | POA: Diagnosis present

## 2015-04-24 DIAGNOSIS — Z7982 Long term (current) use of aspirin: Secondary | ICD-10-CM | POA: Diagnosis not present

## 2015-04-24 DIAGNOSIS — S0083XD Contusion of other part of head, subsequent encounter: Secondary | ICD-10-CM | POA: Diagnosis not present

## 2015-04-24 DIAGNOSIS — E1122 Type 2 diabetes mellitus with diabetic chronic kidney disease: Secondary | ICD-10-CM | POA: Diagnosis present

## 2015-04-24 DIAGNOSIS — I5022 Chronic systolic (congestive) heart failure: Secondary | ICD-10-CM

## 2015-04-24 DIAGNOSIS — D649 Anemia, unspecified: Secondary | ICD-10-CM | POA: Diagnosis present

## 2015-04-24 DIAGNOSIS — N2581 Secondary hyperparathyroidism of renal origin: Secondary | ICD-10-CM | POA: Diagnosis present

## 2015-04-24 DIAGNOSIS — E213 Hyperparathyroidism, unspecified: Secondary | ICD-10-CM | POA: Diagnosis present

## 2015-04-24 DIAGNOSIS — H538 Other visual disturbances: Secondary | ICD-10-CM | POA: Diagnosis present

## 2015-04-24 DIAGNOSIS — E11339 Type 2 diabetes mellitus with moderate nonproliferative diabetic retinopathy without macular edema: Secondary | ICD-10-CM | POA: Diagnosis present

## 2015-04-24 DIAGNOSIS — Z79899 Other long term (current) drug therapy: Secondary | ICD-10-CM | POA: Diagnosis not present

## 2015-04-24 DIAGNOSIS — E119 Type 2 diabetes mellitus without complications: Secondary | ICD-10-CM | POA: Diagnosis not present

## 2015-04-24 DIAGNOSIS — I2510 Atherosclerotic heart disease of native coronary artery without angina pectoris: Secondary | ICD-10-CM | POA: Diagnosis present

## 2015-04-24 DIAGNOSIS — S0083XA Contusion of other part of head, initial encounter: Secondary | ICD-10-CM | POA: Diagnosis not present

## 2015-04-24 DIAGNOSIS — W19XXXA Unspecified fall, initial encounter: Secondary | ICD-10-CM | POA: Diagnosis present

## 2015-04-24 DIAGNOSIS — M170 Bilateral primary osteoarthritis of knee: Secondary | ICD-10-CM | POA: Diagnosis present

## 2015-04-24 DIAGNOSIS — N186 End stage renal disease: Secondary | ICD-10-CM | POA: Diagnosis present

## 2015-04-24 DIAGNOSIS — I459 Conduction disorder, unspecified: Secondary | ICD-10-CM | POA: Diagnosis not present

## 2015-04-24 DIAGNOSIS — Z992 Dependence on renal dialysis: Secondary | ICD-10-CM | POA: Diagnosis not present

## 2015-04-24 LAB — BASIC METABOLIC PANEL
ANION GAP: 14 (ref 5–15)
BUN: 52 mg/dL — ABNORMAL HIGH (ref 6–20)
CO2: 24 mmol/L (ref 22–32)
Calcium: 9.1 mg/dL (ref 8.9–10.3)
Chloride: 94 mmol/L — ABNORMAL LOW (ref 101–111)
Creatinine, Ser: 6.07 mg/dL — ABNORMAL HIGH (ref 0.44–1.00)
GFR calc non Af Amer: 7 mL/min — ABNORMAL LOW (ref >60)
GFR, EST AFRICAN AMERICAN: 8 mL/min — AB (ref >60)
Glucose, Bld: 101 mg/dL — ABNORMAL HIGH (ref 65–99)
Potassium: 3.7 mmol/L (ref 3.5–5.1)
Sodium: 132 mmol/L — ABNORMAL LOW (ref 135–145)

## 2015-04-24 LAB — GLUCOSE, CAPILLARY
GLUCOSE-CAPILLARY: 115 mg/dL — AB (ref 65–99)
Glucose-Capillary: 96 mg/dL (ref 65–99)
Glucose-Capillary: 128 mg/dL — ABNORMAL HIGH (ref 65–99)

## 2015-04-24 LAB — TROPONIN I: Troponin I: 0.05 ng/mL — ABNORMAL HIGH (ref <0.031)

## 2015-04-24 MED ORDER — SODIUM CHLORIDE 0.9 % IV SOLN
INTRAVENOUS | Status: DC
  Administered 2015-04-25: 07:00:00 via INTRAVENOUS

## 2015-04-24 MED ORDER — SODIUM CHLORIDE 0.9 % IV SOLN: 100.0000 mL | INTRAVENOUS | Status: DC | PRN

## 2015-04-24 MED ORDER — PENTAFLUOROPROP-TETRAFLUOROETH EX AERO: 1.0000 "application " | INHALATION_SPRAY | CUTANEOUS | Status: DC | PRN

## 2015-04-24 MED ORDER — LIDOCAINE HCL (PF) 1 % IJ SOLN: 5.0000 mL | INTRAMUSCULAR | Status: DC | PRN

## 2015-04-24 MED ORDER — ALTEPLASE 2 MG IJ SOLR
2.0000 mg | Freq: Once | INTRAMUSCULAR | Status: DC | PRN
Start: 2015-04-24 — End: 2015-04-24

## 2015-04-24 MED ORDER — ASPIRIN 81 MG PO CHEW
81.0000 mg | CHEWABLE_TABLET | ORAL | Status: AC
  Administered 2015-04-25: 81 mg via ORAL
  Filled 2015-04-24: qty 1

## 2015-04-24 MED ORDER — SODIUM CHLORIDE 0.9 % IJ SOLN: 3.0000 mL | INTRAMUSCULAR | Status: DC | PRN

## 2015-04-24 MED ORDER — NEPRO/CARBSTEADY PO LIQD
237.0000 mL | ORAL | Status: DC | PRN
Start: 2015-04-24 — End: 2015-04-24

## 2015-04-24 MED ORDER — HEPARIN SODIUM (PORCINE) 1000 UNIT/ML DIALYSIS: 1000.0000 [IU] | INTRAMUSCULAR | Status: DC | PRN

## 2015-04-24 MED ORDER — LIDOCAINE-PRILOCAINE 2.5-2.5 % EX CREA: 1.0000 "application " | TOPICAL_CREAM | CUTANEOUS | Status: DC | PRN

## 2015-04-24 MED ORDER — SODIUM CHLORIDE 0.9 % IV SOLN: 250.0000 mL | INTRAVENOUS | Status: DC | PRN

## 2015-04-24 MED ORDER — SODIUM CHLORIDE 0.9 % IJ SOLN: 3.0000 mL | Freq: Two times a day (BID) | INTRAMUSCULAR | Status: DC

## 2015-04-24 MED ORDER — ACETAMINOPHEN 325 MG PO TABS
ORAL_TABLET | ORAL | Status: AC
  Administered 2015-04-24: 650 mg via ORAL
  Filled 2015-04-24: qty 2

## 2015-04-24 NOTE — Progress Notes (Signed)
Pharmacist Heart Failure Core Measure Documentation  Assessment: [name] [name] has an EF documented as 35% on echo by 04/24/15.  Rationale: Heart failure patients with left ventricular systolic dysfunction (LVSD) and an EF < 40% should be prescribed an angiotensin converting enzyme inhibitor (ACEI) or angiotensin receptor blocker (ARB) at discharge unless a contraindication is documented in the medical record.  This patient is not currently on an ACEI or ARB for HF.  This note is being placed in the record in order to provide documentation that a contraindication to the use of these agents is present for this encounter.  ACE Inhibitor or Angiotensin Receptor Blocker is contraindicated (specify all that apply)  []   ACEI allergy AND ARB allergy []   Angioedema []   Moderate or severe aortic stenosis []   Hyperkalemia [x]   Hypotension []   Renal artery stenosis [x]   Worsening renal function, preexisting renal disease or dysfunction   [name] 04/24/2015 2:49 PM

## 2015-04-24 NOTE — Consult Note (Signed)
Patient ID: [name] [name] MRN: [id number], DOB/AGE: [birth year]   Admit date: 04/22/2015   Primary Physician: Tommi Rumps, MD Primary Cardiologist: New  Pt. Profile:  64 y/o female with a h/o ESRD on HD and DM admitted  for syncope evaluation. Subsequent 2D echo shows moderate-severe reduction in LV systolic function. EF 35%.   Problem List  Past Medical History  Diagnosis Date  . Hearing difficulty     Mild   . Diabetes mellitus without complication   . Night muscle spasms   . Bursitis   . Fatigue     Past Surgical History  Procedure Laterality Date  . 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 Esophagogastroduodenoscopy Left 02/20/2013    Procedure: ESOPHAGOGASTRODUODENOSCOPY (EGD);  Surgeon: Cleotis Nipper, MD;  Location: [facility] ENDOSCOPY;  Service: Endoscopy;  Laterality: Left;  . Av fistula placement Left 03/07/2013    Procedure: ARTERIOVENOUS (AV) FISTULA CREATION;  Surgeon: Elam Dutch, MD;  Location: [facility] OR;  Service: Vascular;  Laterality: Left;  left brachiocephalic AVF     Allergies  Allergies  Allergen Reactions  . Aleve [Naproxen Sodium]     Due to kidneys.  . Motrin [Ibuprofen]     Due to kidneys.    HPI  The patient is a 63 y/o female with a h/o ESRD on HD (MWF) as well as a h/o DM. Denies h/o tobacco use. She notes family h/o CAD (brother and sister both had MIs). In 2014, she had a 2D echo that was ordered for "atrial fibrillation" however, I do not see this listed in her PMH and it appears that she has not been followed by a cardiologist. She denies ever being told of any irregular cardiac conditions. Her EF was normal in 2014 at 55-60%. Wall motion was also normal.   She presented to the [facility] ER on 04/23/15 after sustaining a syncopal episode. She sustained significant facial trama and a laceration above the lateral aspect of her right eye. Subsequent CT of the head showed no acute abnormalities. Troponin's are minimally  elevated with a flat trend (0.05-->0.06-->0.05), in the setting of CKD. A 2D echo was ordered which demonstrated reduction in EF down to 35%, also with new WMAs not seen on prior echo in 2014. Current findings suggest severe hypokinesis of the inferior wall and the septum. Cardiology has been consulted for further recommendations.   The patient reports that her event happen suddenly and w/o warning. She denies any past history of syncope/ near syncope. She states that she has felt more tired recently, but denies any recent chest pain or palpitations. She had just eaten at a restaurant and as she was walking out to the car, she suddenly collapsed w/o warning. When she awoke, she was in the ED. She denies recent CP, palpitations, lightheadedness/ dizziness. She does admit to exertional dyspnea with day to day activities.   Since admission, she denies any further episodes of syncope/ near syncope. Telemetry shows mild resting bradycardia with rate in the mid-upper 50s. No arrhthymias captured.    Home Medications  Prior to Admission medications   Medication Sig Start Date End Date Taking? Authorizing Provider  calcium carbonate (OS-CAL - DOSED IN MG OF ELEMENTAL CALCIUM) 1250 MG tablet TAKE 1 TABLET BY MOUTH THREE TIMES DAILY WITH MEALS 07/15/13  Yes Amber Fidel Levy, MD  docusate sodium (DOCQLACE) 100 MG capsule Take 1 capsule (100 mg total) by mouth daily as needed. 02/05/15  Yes Randall Hiss  Carolan Shiver, MD  doxycycline (VIBRA-TABS) 100 MG tablet Take 100 mg by mouth 2 (two) times daily. For 7 days   Yes Historical Provider, MD  insulin glargine (LANTUS) 100 UNIT/ML injection Inject 0.03 mLs (3 Units total) into the skin at bedtime. 07/12/14  Yes Leone Haven, MD  multivitamin (RENA-VIT) TABS tablet Take 1 tablet by mouth at bedtime. 09/04/14  Yes Leone Haven, MD  pantoprazole (PROTONIX) 40 MG tablet Take 1 tablet (40 mg total) by mouth daily. 03/06/15  Yes Leeanne Rio, MD  promethazine  (PHENERGAN) 25 MG tablet Take 1 tablet (25 mg total) by mouth every 6 (six) hours as needed for nausea. 03/06/15  Yes Leeanne Rio, MD  sulfamethoxazole-trimethoprim (BACTRIM DS,SEPTRA DS) 800-160 MG per tablet Take 1 tablet by mouth 2 (two) times daily. For 7 days   Yes Historical Provider, MD  venlafaxine XR (EFFEXOR-XR) 75 MG 24 hr capsule TAKE ONE CAPSULE BY MOUTH DAILY WITH BREAKFAST 03/25/15  Yes Leone Haven, MD  aspirin 81 MG EC tablet Take 1 tablet (81 mg total) by mouth daily. Patient not taking: Reported on 04/22/2015 03/08/13   Kandis Nab, MD  metoCLOPramide (REGLAN) 5 MG tablet TAKE 1 TABLET BY MOUTH DAILY BEFORE BREAKFAST Patient not taking: Reported on 04/22/2015 02/20/14   Montez Morita, MD    Family History  Family History  Problem Relation Age of Onset  . Heart disease    . Diabetes    . Stroke    . Bone cancer Father     Of jaws   . Throat cancer Father     Social History  History   Social History  . Marital Status: Married    Spouse Name: N/A  . Number of Children: N/A  . Years of Education: N/A   Occupational History  . Not on file.   Social History Main Topics  . Smoking status: Never Smoker   . Smokeless tobacco: Never Used  . Alcohol Use: No  . Drug Use: No  . Sexual Activity: Not on file   Other Topics Concern  . Not on file   Social History Narrative   Lives with husband Arzella Rehmann (born 50).    Denies alcohol, tobacco (never smoked), other drugs   Hobbies: reading           Review of Systems General:  No chills, fever, night sweats or weight changes.  Cardiovascular:  No chest pain, dyspnea on exertion, edema, orthopnea, palpitations, paroxysmal nocturnal dyspnea. Dermatological: No rash, lesions/masses Respiratory: No cough, dyspnea Urologic: No hematuria, dysuria Abdominal:   No nausea, vomiting, diarrhea, bright red blood per rectum, melena, or hematemesis Neurologic:  No visual changes, wkns, changes in mental  status. All other systems reviewed and are otherwise negative except as noted above.  Physical Exam  Blood pressure 145/75, pulse 59, temperature 97.5 F (36.4 C), temperature source Oral, resp. rate 16, weight 189 lb 14.4 oz (86.138 kg), SpO2 97 %.  General: Pleasant, NAD, facial trauma (laceration above lateal aspect of right eye, bilateral periorbital ecchymosis; ecchymosis along lateral aspect of right side of her face.  Psych: Normal affect. Neuro: Alert and oriented X 3. Moves all extremities spontaneously. HEENT: Normal  Neck: Supple without bruits or JVD. Lungs:  Course rales at left base otherwise clear  Heart: RRR no s3, s4, or murmurs. Abdomen: Soft, non-tender, non-distended, BS + x 4.  Extremities: No clubbing, cyanosis or edema. DP/PT/Radials 2+ and equal bilaterally.  Labs  Troponin (Point of Care Test) No results for input(s): TROPIPOC in the last 72 hours.  Recent Labs  04/23/15 1020 04/23/15 1500 04/23/15 1939 04/24/15 0222  TROPONINI 0.06* 0.05* 0.06* 0.05*   Lab Results  Component Value Date   WBC 5.7 04/23/2015   HGB 11.2* 04/23/2015   HCT 32.9* 04/23/2015   MCV 100.6* 04/23/2015   PLT 125* 04/23/2015    Recent Labs Lab 04/24/15 0820  NA 132*  K 3.7  CL 94*  CO2 24  BUN 52*  CREATININE 6.07*  CALCIUM 9.1  GLUCOSE 101*   Lab Results  Component Value Date   CHOL 191 06/12/2014   HDL 51 06/12/2014   LDLCALC 117* 06/12/2014   TRIG 115 06/12/2014   No results found for: DDIMER   Radiology/Studies  Dg Chest 2 View  04/23/2015   CLINICAL DATA:  Fall with right hip pain.  Shortness of breath.  EXAM: CHEST  2 VIEW  COMPARISON:  02/21/2013  FINDINGS: The heart is enlarged. Diffuse reticular opacities suggesting pulmonary edema. No confluent airspace disease. No pleural effusion. No pneumothorax. No acute osseous abnormalities are seen. Hyperdensity in the left upper abdomen is likely related to ingested material.  IMPRESSION: Cardiomegaly and  pulmonary edema.  Correlation for CHF recommended.   Electronically Signed   By: Jeb Levering M.D.   On: 04/23/2015 00:05   Ct Head Wo Contrast  04/22/2015   CLINICAL DATA:  Unwitnessed fall with large right orbital hematoma, initial encounter  EXAM: CT HEAD WITHOUT CONTRAST  CT MAXILLOFACIAL WITHOUT CONTRAST  CT CERVICAL SPINE WITHOUT CONTRAST  TECHNIQUE: Multidetector CT imaging of the head, cervical spine, and maxillofacial structures were performed using the standard protocol without intravenous contrast. Multiplanar CT image reconstructions of the cervical spine and maxillofacial structures were also generated.  COMPARISON:  None.  FINDINGS: CT HEAD FINDINGS  Bony calvarium is intact. A large soft tissue hematoma is noted about the right orbit and right cheek consistent with the patient's recent injury. Mild atrophic changes are noted. No findings to suggest acute hemorrhage, acute infarction or space-occupying mass lesion are noted.  CT MAXILLOFACIAL FINDINGS  Significant motion artifact is identified limiting the exam. No definitive fracture is seen. Significant soft tissue swelling is again noted in the region of the right cheek and right orbit. A focal 2.5 cm hematoma with a fluid fluid level is noted just lateral to the right orbit. The orbits and their contents are within normal limits. Dental caries are noted. No other focal abnormality is seen.  CT CERVICAL SPINE FINDINGS  Seven cervical segments are well visualized. Multilevel disc space narrowing is noted with mild associated osteophytic changes. Facet hypertrophic changes are seen. No acute fracture or acute facet abnormality is noted. The surrounding soft tissues are within normal limits.  IMPRESSION: CT of the head: Atrophic changes without acute intracranial abnormality.  Significant right facial hematoma.  CT of the maxillofacial bones: Considerable right facial soft tissue changes consistent with a recent injury. A focal 2.5 cm hematoma is  noted just lateral to the right orbit. No acute fracture is identified.  CT of the cervical spine: Multilevel degenerative change without acute abnormality.   Electronically Signed   By: Inez Catalina M.D.   On: 04/22/2015 22:53   Ct Cervical Spine Wo Contrast  04/22/2015   CLINICAL DATA:  Unwitnessed fall with large right orbital hematoma, initial encounter  EXAM: CT HEAD WITHOUT CONTRAST  CT MAXILLOFACIAL WITHOUT CONTRAST  CT CERVICAL SPINE WITHOUT CONTRAST  TECHNIQUE: Multidetector CT imaging of the head, cervical spine, and maxillofacial structures were performed using the standard protocol without intravenous contrast. Multiplanar CT image reconstructions of the cervical spine and maxillofacial structures were also generated.  COMPARISON:  None.  FINDINGS: CT HEAD FINDINGS  Bony calvarium is intact. A large soft tissue hematoma is noted about the right orbit and right cheek consistent with the patient's recent injury. Mild atrophic changes are noted. No findings to suggest acute hemorrhage, acute infarction or space-occupying mass lesion are noted.  CT MAXILLOFACIAL FINDINGS  Significant motion artifact is identified limiting the exam. No definitive fracture is seen. Significant soft tissue swelling is again noted in the region of the right cheek and right orbit. A focal 2.5 cm hematoma with a fluid fluid level is noted just lateral to the right orbit. The orbits and their contents are within normal limits. Dental caries are noted. No other focal abnormality is seen.  CT CERVICAL SPINE FINDINGS  Seven cervical segments are well visualized. Multilevel disc space narrowing is noted with mild associated osteophytic changes. Facet hypertrophic changes are seen. No acute fracture or acute facet abnormality is noted. The surrounding soft tissues are within normal limits.  IMPRESSION: CT of the head: Atrophic changes without acute intracranial abnormality.  Significant right facial hematoma.  CT of the maxillofacial  bones: Considerable right facial soft tissue changes consistent with a recent injury. A focal 2.5 cm hematoma is noted just lateral to the right orbit. No acute fracture is identified.  CT of the cervical spine: Multilevel degenerative change without acute abnormality.   Electronically Signed   By: Inez Catalina M.D.   On: 04/22/2015 22:53   Dg Hip Unilat With Pelvis 2-3 Views Right  04/23/2015   CLINICAL DATA:  Fall, RIGHT hip pain.  EXAM: RIGHT HIP (WITH PELVIS) 2-3 VIEWS  COMPARISON:  None.  FINDINGS: There is no evidence of hip fracture or dislocation. There is mild RIGHT hip joint space narrowing. No pelvic fracture. Fecal impaction.  IMPRESSION: No acute hip fracture is evident.  Degenerative change.   Electronically Signed   By: Rolla Flatten M.D.   On: 04/23/2015 00:08   Ct Maxillofacial Wo Cm  04/22/2015   CLINICAL DATA:  Unwitnessed fall with large right orbital hematoma, initial encounter  EXAM: CT HEAD WITHOUT CONTRAST  CT MAXILLOFACIAL WITHOUT CONTRAST  CT CERVICAL SPINE WITHOUT CONTRAST  TECHNIQUE: Multidetector CT imaging of the head, cervical spine, and maxillofacial structures were performed using the standard protocol without intravenous contrast. Multiplanar CT image reconstructions of the cervical spine and maxillofacial structures were also generated.  COMPARISON:  None.  FINDINGS: CT HEAD FINDINGS  Bony calvarium is intact. A large soft tissue hematoma is noted about the right orbit and right cheek consistent with the patient's recent injury. Mild atrophic changes are noted. No findings to suggest acute hemorrhage, acute infarction or space-occupying mass lesion are noted.  CT MAXILLOFACIAL FINDINGS  Significant motion artifact is identified limiting the exam. No definitive fracture is seen. Significant soft tissue swelling is again noted in the region of the right cheek and right orbit. A focal 2.5 cm hematoma with a fluid fluid level is noted just lateral to the right orbit. The orbits and  their contents are within normal limits. Dental caries are noted. No other focal abnormality is seen.  CT CERVICAL SPINE FINDINGS  Seven cervical segments are well visualized. Multilevel disc space narrowing is noted with mild associated osteophytic changes. Facet hypertrophic changes are seen. No  acute fracture or acute facet abnormality is noted. The surrounding soft tissues are within normal limits.  IMPRESSION: CT of the head: Atrophic changes without acute intracranial abnormality.  Significant right facial hematoma.  CT of the maxillofacial bones: Considerable right facial soft tissue changes consistent with a recent injury. A focal 2.5 cm hematoma is noted just lateral to the right orbit. No acute fracture is identified.  CT of the cervical spine: Multilevel degenerative change without acute abnormality.   Electronically Signed   By: Inez Catalina M.D.   On: 04/22/2015 22:53    ECG SR 63 bpm  Echocardiogram   Study Conclusions  - Left ventricle: Wall motion abnormalities are new since study of 2014. Severe hypokinesis of inferior wall and the septum. The cavity size was normal. Wall thickness was increased in a pattern of mild LVH. The estimated ejection fraction was 35%. - Aortic valve: Sclerosis without stenosis. There was no significant regurgitation. - Left atrium: The atrium was moderately dilated. - Right ventricle: The cavity size was mildly dilated. Systolic function was moderately reduced. - Tricuspid valve: There was mild-moderate regurgitation. - Pulmonary arteries: PA peak pressure: 44 mm Hg (S).    ASSESSMENT AND PLAN  1. LV Systolic Dysfunction: new finding since 2014. EF now 35% (55-60% in 2014). Also with new WMA with hypokinesis of the inferior wall and septum. New, unexplained reduction in LV systolic function and new WMA in the setting of syncope warrants ischemic evaluation to r/o CAD. Recommend LHC to further evaluate. ACE/ARB therapy is limited due to  CKD. BB therapy also limited by borderline bradycardia.  Consider use of hydralazine + nitrate, however this may also be limited by hypotension during HD.   2. Syncope: in the setting of moderate-severe LV systolic function, this may be subsequent to underlying CAD or ventricular arrhthymias. No arrhythmias capture thus far on telemetry. Recommend LHC this admission. Continue to monitor on telemetry for arrhythmias. May need to consider 30 day monitor. No driving for at least 6 months.    SignedLyda Jester, PA-C 04/24/2015, 3:42 PM

## 2015-04-24 NOTE — Progress Notes (Signed)
  Echocardiogram 2D Echocardiogram has been performed.  Diamond Nickel 04/24/2015, 9:34 AM

## 2015-04-24 NOTE — Progress Notes (Signed)
Pt received HD with no complications. Pt alert, vss. Report called to 5N RN Antonique B. They will be transporting patient back to room.

## 2015-04-24 NOTE — Consult Note (Signed)
Reason for Consult:ESRD, Uremia, vol overload Referring Physician: Dr. Charna Busman is an 64 y.o. female.  HPI: 64 yr female with ESRD secondary to DM.  Hx anemia , HPTH, othostasis chronic vol xs, DM retinopathy, with recurrent falls at home not associated with dialysis timing.  Admitted with syncope with secondary facial trauma, skel bruising without fx. Occurred without warning.    In past falls occurred with stumbling and not using assistive devices.  Have left with signif edema at the dialysis unit to avoid orthostasis and bp drops during HD. Constitutional: weak and unsteady Eyes: DM retinopathy and vision limitations Ears, nose, mouth, throat, and face: HOH , massive facial bruising on R Respiratory: occ SOB Cardiovascular: edema Gastrointestinal: negative Genitourinary:very little urine Integument/breast: much vitiligo, now bruisng, had blister R calf Hematologic/lymphatic: anemic on epo Musculoskeletal:sore hip on R and face Neurological: as above Endocrine: variable bs Allergic/Immunologic: NSAIDs   Dialyzes at [facility] on MWF . Primary Nephrologist Daquarius Dubeau. EDW 87 kg. HD Bath 2.25, Ca, 2 k, Dialyzer 180NR, Heparin strd. Access LUA AVF.  Past Medical History  Diagnosis Date  . Hearing difficulty     Mild   . Diabetes mellitus without complication   . Night muscle spasms   . Bursitis   . Fatigue     Past Surgical History  Procedure Laterality Date  . 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 Esophagogastroduodenoscopy Left 02/20/2013    Procedure: ESOPHAGOGASTRODUODENOSCOPY (EGD);  Surgeon: Cleotis Nipper, MD;  Location: [facility] ENDOSCOPY;  Service: Endoscopy;  Laterality: Left;  . Av fistula placement Left 03/07/2013    Procedure: ARTERIOVENOUS (AV) FISTULA CREATION;  Surgeon: Elam Dutch, MD;  Location: [facility] OR;  Service: Vascular;  Laterality: Left;  left brachiocephalic AVF    Family History  Problem Relation Age of Onset  .  Heart disease    . Diabetes    . Stroke    . Bone cancer Father     Of jaws   . Throat cancer Father     Social History:  reports that she has never smoked. She has never used smokeless tobacco. She reports that she does not drink alcohol or use illicit drugs.  Allergies:  Allergies  Allergen Reactions  . Aleve [Naproxen Sodium]     Due to kidneys.  . Motrin [Ibuprofen]     Due to kidneys.    Medications:  I have reviewed the patient's current medications. Prior to Admission:  Prescriptions prior to admission  Medication Sig Dispense Refill Last Dose  . calcium carbonate (OS-CAL - DOSED IN MG OF ELEMENTAL CALCIUM) 1250 MG tablet TAKE 1 TABLET BY MOUTH THREE TIMES DAILY WITH MEALS 100 tablet 0 04/22/2015 at Unknown time  . docusate sodium (DOCQLACE) 100 MG capsule Take 1 capsule (100 mg total) by mouth daily as needed. 30 capsule 3 unkn  . doxycycline (VIBRA-TABS) 100 MG tablet Take 100 mg by mouth 2 (two) times daily. For 7 days   04/22/2015 at Unknown time  . insulin glargine (LANTUS) 100 UNIT/ML injection Inject 0.03 mLs (3 Units total) into the skin at bedtime. 10 mL 6 04/21/2015 at Unknown time  . multivitamin (RENA-VIT) TABS tablet Take 1 tablet by mouth at bedtime. 90 tablet 3 04/22/2015 at Unknown time  . pantoprazole (PROTONIX) 40 MG tablet Take 1 tablet (40 mg total) by mouth daily. 30 tablet 1 04/22/2015 at Unknown time  . promethazine (PHENERGAN) 25 MG tablet Take 1 tablet (25 mg  total) by mouth every 6 (six) hours as needed for nausea. 30 tablet 1 unkn  . sulfamethoxazole-trimethoprim (BACTRIM DS,SEPTRA DS) 800-160 MG per tablet Take 1 tablet by mouth 2 (two) times daily. For 7 days   04/22/2015 at Unknown time  . venlafaxine XR (EFFEXOR-XR) 75 MG 24 hr capsule TAKE ONE CAPSULE BY MOUTH DAILY WITH BREAKFAST 30 capsule 1 04/22/2015 at Unknown time  . aspirin 81 MG EC tablet Take 1 tablet (81 mg total) by mouth daily. (Patient not taking: Reported on 04/22/2015) 30 tablet 0 Not Taking at  Unknown time  . metoCLOPramide (REGLAN) 5 MG tablet TAKE 1 TABLET BY MOUTH DAILY BEFORE BREAKFAST (Patient not taking: Reported on 04/22/2015) 30 tablet 11 Not Taking at Unknown time   , Calcitriol . tiw.    Results for orders placed or performed during the hospital encounter of 04/22/15 (from the past 48 hour(s))  CBG, ED     Status: Abnormal   Collection Time: 04/22/15  9:29 PM  Result Value Ref Range   Glucose-Capillary 191 (H) 65 - 99 mg/dL  CBC     Status: Abnormal   Collection Time: 04/22/15  9:47 PM  Result Value Ref Range   WBC 5.5 4.0 - 10.5 K/uL   RBC 3.37 (L) 3.87 - 5.11 MIL/uL   Hemoglobin 11.3 (L) 12.0 - 15.0 g/dL   HCT 10.0 (L) 00.8 - 00.7 %   MCV 99.4 78.0 - 100.0 fL   MCH 33.5 26.0 - 34.0 pg   MCHC 33.7 30.0 - 36.0 g/dL   RDW 85.5 64.0 - 67.9 %   Platelets 105 (L) 150 - 400 K/uL    Comment: SPECIMEN CHECKED FOR CLOTS REPEATED TO VERIFY PLATELET COUNT CONFIRMED BY SMEAR   Basic metabolic panel     Status: Abnormal   Collection Time: 04/22/15  9:47 PM  Result Value Ref Range   Sodium 135 135 - 145 mmol/L   Potassium 3.4 (L) 3.5 - 5.1 mmol/L   Chloride 96 (L) 101 - 111 mmol/L   CO2 27 22 - 32 mmol/L   Glucose, Bld 204 (H) 65 - 99 mg/dL   BUN 28 (H) 6 - 20 mg/dL   Creatinine, Ser 1.50 (H) 0.44 - 1.00 mg/dL   Calcium 8.2 (L) 8.9 - 10.3 mg/dL   GFR calc non Af Amer 12 (L) >60 mL/min   GFR calc Af Amer 14 (L) >60 mL/min    Comment: (NOTE) The eGFR has been calculated using the CKD EPI equation. This calculation has not been validated in all clinical situations. eGFR's persistently <60 mL/min signify possible Chronic Kidney Disease.    Anion gap 12 5 - 15  Troponin I     Status: Abnormal   Collection Time: 04/22/15  9:47 PM  Result Value Ref Range   Troponin I 0.05 (H) <0.031 ng/mL    Comment:        PERSISTENTLY INCREASED TROPONIN VALUES IN THE RANGE OF 0.04-0.49 ng/mL CAN BE SEEN IN:       -UNSTABLE ANGINA       -CONGESTIVE HEART FAILURE        -MYOCARDITIS       -CHEST TRAUMA       -ARRYHTHMIAS       -LATE PRESENTING MYOCARDIAL INFARCTION       -COPD   CLINICAL FOLLOW-UP RECOMMENDED.   Glucose, capillary     Status: Abnormal   Collection Time: 04/23/15  3:21 AM  Result Value Ref Range  Glucose-Capillary 135 (H) 65 - 99 mg/dL  Troponin I     Status: Abnormal   Collection Time: 04/23/15  4:22 AM  Result Value Ref Range   Troponin I 0.04 (H) <0.031 ng/mL    Comment:        PERSISTENTLY INCREASED TROPONIN VALUES IN THE RANGE OF 0.04-0.49 ng/mL CAN BE SEEN IN:       -UNSTABLE ANGINA       -CONGESTIVE HEART FAILURE       -MYOCARDITIS       -CHEST TRAUMA       -ARRYHTHMIAS       -LATE PRESENTING MYOCARDIAL INFARCTION       -COPD   CLINICAL FOLLOW-UP RECOMMENDED.   CBC     Status: Abnormal   Collection Time: 04/23/15  4:22 AM  Result Value Ref Range   WBC 5.7 4.0 - 10.5 K/uL    Comment: REPEATED TO VERIFY WHITE COUNT CONFIRMED ON SMEAR    RBC 3.27 (L) 3.87 - 5.11 MIL/uL   Hemoglobin 11.2 (L) 12.0 - 15.0 g/dL   HCT 66.2 (L) 94.7 - 65.4 %   MCV 100.6 (H) 78.0 - 100.0 fL   MCH 34.3 (H) 26.0 - 34.0 pg   MCHC 34.0 30.0 - 36.0 g/dL   RDW 65.0 35.4 - 65.6 %   Platelets 125 (L) 150 - 400 K/uL  Brain natriuretic peptide     Status: Abnormal   Collection Time: 04/23/15  4:22 AM  Result Value Ref Range   B Natriuretic Peptide 3990.5 (H) 0.0 - 100.0 pg/mL  Glucose, capillary     Status: Abnormal   Collection Time: 04/23/15  6:32 AM  Result Value Ref Range   Glucose-Capillary 131 (H) 65 - 99 mg/dL  Troponin I     Status: Abnormal   Collection Time: 04/23/15 10:20 AM  Result Value Ref Range   Troponin I 0.06 (H) <0.031 ng/mL    Comment:        PERSISTENTLY INCREASED TROPONIN VALUES IN THE RANGE OF 0.04-0.49 ng/mL CAN BE SEEN IN:       -UNSTABLE ANGINA       -CONGESTIVE HEART FAILURE       -MYOCARDITIS       -CHEST TRAUMA       -ARRYHTHMIAS       -LATE PRESENTING MYOCARDIAL INFARCTION       -COPD   CLINICAL  FOLLOW-UP RECOMMENDED.   Basic metabolic panel     Status: Abnormal   Collection Time: 04/23/15 10:20 AM  Result Value Ref Range   Sodium 134 (L) 135 - 145 mmol/L   Potassium 3.2 (L) 3.5 - 5.1 mmol/L   Chloride 95 (L) 101 - 111 mmol/L   CO2 26 22 - 32 mmol/L   Glucose, Bld 145 (H) 65 - 99 mg/dL   BUN 34 (H) 6 - 20 mg/dL   Creatinine, Ser 8.12 (H) 0.44 - 1.00 mg/dL   Calcium 8.4 (L) 8.9 - 10.3 mg/dL   GFR calc non Af Amer 9 (L) >60 mL/min   GFR calc Af Amer 10 (L) >60 mL/min    Comment: (NOTE) The eGFR has been calculated using the CKD EPI equation. This calculation has not been validated in all clinical situations. eGFR's persistently <60 mL/min signify possible Chronic Kidney Disease.    Anion gap 13 5 - 15  Glucose, capillary     Status: Abnormal   Collection Time: 04/23/15 11:23 AM  Result Value Ref Range  Glucose-Capillary 108 (H) 65 - 99 mg/dL   Comment 1 Notify RN   Troponin I (q 6hr x 3)     Status: Abnormal   Collection Time: 04/23/15  3:00 PM  Result Value Ref Range   Troponin I 0.05 (H) <0.031 ng/mL    Comment:        PERSISTENTLY INCREASED TROPONIN VALUES IN THE RANGE OF 0.04-0.49 ng/mL CAN BE SEEN IN:       -UNSTABLE ANGINA       -CONGESTIVE HEART FAILURE       -MYOCARDITIS       -CHEST TRAUMA       -ARRYHTHMIAS       -LATE PRESENTING MYOCARDIAL INFARCTION       -COPD   CLINICAL FOLLOW-UP RECOMMENDED.   Glucose, capillary     Status: Abnormal   Collection Time: 04/23/15  3:56 PM  Result Value Ref Range   Glucose-Capillary 126 (H) 65 - 99 mg/dL   Comment 1 Notify RN   Troponin I (q 6hr x 3)     Status: Abnormal   Collection Time: 04/23/15  7:39 PM  Result Value Ref Range   Troponin I 0.06 (H) <0.031 ng/mL    Comment:        PERSISTENTLY INCREASED TROPONIN VALUES IN THE RANGE OF 0.04-0.49 ng/mL CAN BE SEEN IN:       -UNSTABLE ANGINA       -CONGESTIVE HEART FAILURE       -MYOCARDITIS       -CHEST TRAUMA       -ARRYHTHMIAS       -LATE PRESENTING  MYOCARDIAL INFARCTION       -COPD   CLINICAL FOLLOW-UP RECOMMENDED.   Glucose, capillary     Status: Abnormal   Collection Time: 04/23/15  9:45 PM  Result Value Ref Range   Glucose-Capillary 103 (H) 65 - 99 mg/dL  Troponin I (q 6hr x 3)     Status: Abnormal   Collection Time: 04/24/15  2:22 AM  Result Value Ref Range   Troponin I 0.05 (H) <0.031 ng/mL    Comment:        PERSISTENTLY INCREASED TROPONIN VALUES IN THE RANGE OF 0.04-0.49 ng/mL CAN BE SEEN IN:       -UNSTABLE ANGINA       -CONGESTIVE HEART FAILURE       -MYOCARDITIS       -CHEST TRAUMA       -ARRYHTHMIAS       -LATE PRESENTING MYOCARDIAL INFARCTION       -COPD   CLINICAL FOLLOW-UP RECOMMENDED.   Glucose, capillary     Status: None   Collection Time: 04/24/15  6:19 AM  Result Value Ref Range   Glucose-Capillary 96 65 - 99 mg/dL  Basic metabolic panel     Status: Abnormal   Collection Time: 04/24/15  8:20 AM  Result Value Ref Range   Sodium 132 (L) 135 - 145 mmol/L   Potassium 3.7 3.5 - 5.1 mmol/L   Chloride 94 (L) 101 - 111 mmol/L   CO2 24 22 - 32 mmol/L   Glucose, Bld 101 (H) 65 - 99 mg/dL   BUN 52 (H) 6 - 20 mg/dL   Creatinine, Ser 6.07 (H) 0.44 - 1.00 mg/dL   Calcium 9.1 8.9 - 10.3 mg/dL   GFR calc non Af Amer 7 (L) >60 mL/min   GFR calc Af Amer 8 (L) >60 mL/min    Comment: (NOTE) The eGFR has  been calculated using the CKD EPI equation. This calculation has not been validated in all clinical situations. eGFR's persistently <60 mL/min signify possible Chronic Kidney Disease.    Anion gap 14 5 - 15  Glucose, capillary     Status: Abnormal   Collection Time: 04/24/15 11:20 AM  Result Value Ref Range   Glucose-Capillary 128 (H) 65 - 99 mg/dL   Comment 1 Repeat Test    Comment 2 Document in Chart     Dg Chest 2 View  04/23/2015   CLINICAL DATA:  Fall with right hip pain.  Shortness of breath.  EXAM: CHEST  2 VIEW  COMPARISON:  02/21/2013  FINDINGS: The heart is enlarged. Diffuse reticular opacities  suggesting pulmonary edema. No confluent airspace disease. No pleural effusion. No pneumothorax. No acute osseous abnormalities are seen. Hyperdensity in the left upper abdomen is likely related to ingested material.  IMPRESSION: Cardiomegaly and pulmonary edema.  Correlation for CHF recommended.   Electronically Signed   By: Jeb Levering M.D.   On: 04/23/2015 00:05   Ct Head Wo Contrast  04/22/2015   CLINICAL DATA:  Unwitnessed fall with large right orbital hematoma, initial encounter  EXAM: CT HEAD WITHOUT CONTRAST  CT MAXILLOFACIAL WITHOUT CONTRAST  CT CERVICAL SPINE WITHOUT CONTRAST  TECHNIQUE: Multidetector CT imaging of the head, cervical spine, and maxillofacial structures were performed using the standard protocol without intravenous contrast. Multiplanar CT image reconstructions of the cervical spine and maxillofacial structures were also generated.  COMPARISON:  None.  FINDINGS: CT HEAD FINDINGS  Bony calvarium is intact. A large soft tissue hematoma is noted about the right orbit and right cheek consistent with the patient's recent injury. Mild atrophic changes are noted. No findings to suggest acute hemorrhage, acute infarction or space-occupying mass lesion are noted.  CT MAXILLOFACIAL FINDINGS  Significant motion artifact is identified limiting the exam. No definitive fracture is seen. Significant soft tissue swelling is again noted in the region of the right cheek and right orbit. A focal 2.5 cm hematoma with a fluid fluid level is noted just lateral to the right orbit. The orbits and their contents are within normal limits. Dental caries are noted. No other focal abnormality is seen.  CT CERVICAL SPINE FINDINGS  Seven cervical segments are well visualized. Multilevel disc space narrowing is noted with mild associated osteophytic changes. Facet hypertrophic changes are seen. No acute fracture or acute facet abnormality is noted. The surrounding soft tissues are within normal limits.  IMPRESSION:  CT of the head: Atrophic changes without acute intracranial abnormality.  Significant right facial hematoma.  CT of the maxillofacial bones: Considerable right facial soft tissue changes consistent with a recent injury. A focal 2.5 cm hematoma is noted just lateral to the right orbit. No acute fracture is identified.  CT of the cervical spine: Multilevel degenerative change without acute abnormality.   Electronically Signed   By: Inez Catalina M.D.   On: 04/22/2015 22:53   Ct Cervical Spine Wo Contrast  04/22/2015   CLINICAL DATA:  Unwitnessed fall with large right orbital hematoma, initial encounter  EXAM: CT HEAD WITHOUT CONTRAST  CT MAXILLOFACIAL WITHOUT CONTRAST  CT CERVICAL SPINE WITHOUT CONTRAST  TECHNIQUE: Multidetector CT imaging of the head, cervical spine, and maxillofacial structures were performed using the standard protocol without intravenous contrast. Multiplanar CT image reconstructions of the cervical spine and maxillofacial structures were also generated.  COMPARISON:  None.  FINDINGS: CT HEAD FINDINGS  Bony calvarium is intact. A large soft tissue  hematoma is noted about the right orbit and right cheek consistent with the patient's recent injury. Mild atrophic changes are noted. No findings to suggest acute hemorrhage, acute infarction or space-occupying mass lesion are noted.  CT MAXILLOFACIAL FINDINGS  Significant motion artifact is identified limiting the exam. No definitive fracture is seen. Significant soft tissue swelling is again noted in the region of the right cheek and right orbit. A focal 2.5 cm hematoma with a fluid fluid level is noted just lateral to the right orbit. The orbits and their contents are within normal limits. Dental caries are noted. No other focal abnormality is seen.  CT CERVICAL SPINE FINDINGS  Seven cervical segments are well visualized. Multilevel disc space narrowing is noted with mild associated osteophytic changes. Facet hypertrophic changes are seen. No acute  fracture or acute facet abnormality is noted. The surrounding soft tissues are within normal limits.  IMPRESSION: CT of the head: Atrophic changes without acute intracranial abnormality.  Significant right facial hematoma.  CT of the maxillofacial bones: Considerable right facial soft tissue changes consistent with a recent injury. A focal 2.5 cm hematoma is noted just lateral to the right orbit. No acute fracture is identified.  CT of the cervical spine: Multilevel degenerative change without acute abnormality.   Electronically Signed   By: Inez Catalina M.D.   On: 04/22/2015 22:53   Dg Hip Unilat With Pelvis 2-3 Views Right  04/23/2015   CLINICAL DATA:  Fall, RIGHT hip pain.  EXAM: RIGHT HIP (WITH PELVIS) 2-3 VIEWS  COMPARISON:  None.  FINDINGS: There is no evidence of hip fracture or dislocation. There is mild RIGHT hip joint space narrowing. No pelvic fracture. Fecal impaction.  IMPRESSION: No acute hip fracture is evident.  Degenerative change.   Electronically Signed   By: Rolla Flatten M.D.   On: 04/23/2015 00:08   Ct Maxillofacial Wo Cm  04/22/2015   CLINICAL DATA:  Unwitnessed fall with large right orbital hematoma, initial encounter  EXAM: CT HEAD WITHOUT CONTRAST  CT MAXILLOFACIAL WITHOUT CONTRAST  CT CERVICAL SPINE WITHOUT CONTRAST  TECHNIQUE: Multidetector CT imaging of the head, cervical spine, and maxillofacial structures were performed using the standard protocol without intravenous contrast. Multiplanar CT image reconstructions of the cervical spine and maxillofacial structures were also generated.  COMPARISON:  None.  FINDINGS: CT HEAD FINDINGS  Bony calvarium is intact. A large soft tissue hematoma is noted about the right orbit and right cheek consistent with the patient's recent injury. Mild atrophic changes are noted. No findings to suggest acute hemorrhage, acute infarction or space-occupying mass lesion are noted.  CT MAXILLOFACIAL FINDINGS  Significant motion artifact is identified  limiting the exam. No definitive fracture is seen. Significant soft tissue swelling is again noted in the region of the right cheek and right orbit. A focal 2.5 cm hematoma with a fluid fluid level is noted just lateral to the right orbit. The orbits and their contents are within normal limits. Dental caries are noted. No other focal abnormality is seen.  CT CERVICAL SPINE FINDINGS  Seven cervical segments are well visualized. Multilevel disc space narrowing is noted with mild associated osteophytic changes. Facet hypertrophic changes are seen. No acute fracture or acute facet abnormality is noted. The surrounding soft tissues are within normal limits.  IMPRESSION: CT of the head: Atrophic changes without acute intracranial abnormality.  Significant right facial hematoma.  CT of the maxillofacial bones: Considerable right facial soft tissue changes consistent with a recent injury. A focal 2.5  cm hematoma is noted just lateral to the right orbit. No acute fracture is identified.  CT of the cervical spine: Multilevel degenerative change without acute abnormality.   Electronically Signed   By: Inez Catalina M.D.   On: 04/22/2015 22:53    ROS Blood pressure 145/75, pulse 59, temperature 97.5 F (36.4 C), temperature source Oral, resp. rate 16, weight 86.138 kg (189 lb 14.4 oz), SpO2 97 %. Physical Exam Physical Examination: General appearance - facial bruises Mental status - alert, oriented to person, place, and time, HOH Eyes - R eye swollen shut, R side of face massive bruising Mouth - mucous membranes moist, pharynx normal without lesions Neck - swelling but PCL3 Lymphatics - posterior cervical nodes Chest - decreased air entry noted bilat, bibasilar crackles Heart - S1 and S2 normal, S4 present, systolic murmur SQ5/8 at apex Abdomen - soft, pos bs, liver down 6 cm Extremities - pedal edema 2 +, AVF LUA Skin - varied hyper aand hypopig, bruises  Assessment/Plan: 1 FAlls this appears true syncope,  but others not. Needs full time supervision 2 ESRD: for HD vol xs,but not able to remove 3 Hypertension: occ pre HD 4. Anemia of ESRD: off meds not 5. Metabolic Bone Disease: On vit D, sensipar 6 DM 7 Orthostasis 8 Neuropathy 9 HOH 10 Memory loss P ?? SNF, HD, review meds.   Jasai Sorg L 04/24/2015, 2:32 PM

## 2015-04-24 NOTE — Progress Notes (Signed)
PT Cancellation Note  Patient Details Name: [name] [name] MRN: [id number] DOB: Aug 15, 1951   Cancelled Treatment:    Reason Eval/Treat Not Completed: Patient at procedure or test/unavailable ECHO being performed in room. Will follow up as time allows.  Ellouise Newer 04/24/2015, 8:45 AM Elayne Snare, [place]

## 2015-04-24 NOTE — Progress Notes (Signed)
Pt to dialysis after giving rept to [facility].

## 2015-04-24 NOTE — Progress Notes (Signed)
Pt noted to have scabs of the BLE. Pt repts that she had an open wound of the RLE and was receiving a "pill" for it. Area is circular and scabbed at this time. Alll scabs open to air.  Pt has bilateral facial bruising and edema with R worse than L. R eye is swollen shut. Pt has a laceration over the R eye that has been sutured and is open to air and healing appropriately. No pressure skin issues noted. Pt has excoriated area of R posterior back. Pt refuses ted hose. Pt educated but continues to refuse. Pt oxygen removed after weaning pt down to 1 lpm and her oxygen sat on 1 lpm was 98%. On RA asleep, pt oxygen sats drop to 84%. Oxygen reapplied. Will continue to monitor.

## 2015-04-24 NOTE — Evaluation (Addendum)
Occupational Therapy Evaluation Patient Details Name: [name] [name] MRN: [id number] DOB: [birth year] Today's Date: 04/24/2015    History of Present Illness Pt is a 64 y.o. Female admitted 04/22/15 with syncopal episode resulting in fall with facial injuries and eye lid hematoma. CT head/neck negative for acute fx or cranial hemorrhage. PMH ESRD (HD on MWF, s/p R- nephrectomy), DM2, OA, memory loss, GERD, depression. Pt with 2 recent falls in past 6 months.     Clinical Impression   PTA pt lived at home and was independent with use of quad cane. Pt was limited by lethargy and decreased balance and requires min A for functional mobility and LB ADLs. Pt will benefit from acute OT to progress to Supervision level to d/c home. SPO2 92% on RA after ambulation to toilet.     Follow Up Recommendations  Home health OT;Supervision/Assistance - 24 hour    Equipment Recommendations  None recommended by OT    Recommendations for Other Services       Precautions / Restrictions Precautions Precautions: Fall Restrictions Weight Bearing Restrictions: No      Mobility Bed Mobility               General bed mobility comments: Pt sitting in recliner when therapy arrived  Transfers Overall transfer level: Needs assistance Equipment used: Rolling walker (2 wheeled) Transfers: Sit to/from Stand Sit to Stand: Min assist         General transfer comment: Min A to steady. Pt uses rocking method. Stood from recliner and from [facility].          ADL Overall ADL's : Needs assistance/impaired Eating/Feeding: Independent;Sitting   Grooming: Set up;Sitting   Upper Body Bathing: Set up;Sitting   Lower Body Bathing: Minimal assistance;Sit to/from stand   Upper Body Dressing : Set up;Sitting   Lower Body Dressing: Sit to/from stand;Minimal assistance   Toilet Transfer: Minimal assistance;Ambulation;RW;BSC;Grab bars   Toileting- Clothing Manipulation and Hygiene: Minimal assistance;Sit  to/from stand       Functional mobility during ADLs: Cane;Min guard General ADL Comments: Pt uses rocking method to stand and requires min A to steady, possibly due to lethargy.      Vision Additional Comments: No change from baseline          Pertinent Vitals/Pain Pain Assessment: No/denies pain     Hand Dominance Right   Extremity/Trunk Assessment Upper Extremity Assessment Upper Extremity Assessment: Overall WFL for tasks assessed   Lower Extremity Assessment Lower Extremity Assessment: Defer to PT evaluation   Cervical / Trunk Assessment Cervical / Trunk Assessment: Normal   Communication Communication Communication: HOH   Cognition Arousal/Alertness: Lethargic;Suspect due to medications Behavior During Therapy: Flat affect;Anxious Overall Cognitive Status: No family/caregiver present to determine baseline cognitive functioning                                Home Living Family/patient expects to be discharged to:: Private residence Living Arrangements: Spouse/significant other Available Help at Discharge: Family Type of Home: House Home Access: Level entry     Home Layout: One [place]: [place] - single point;Walker - 4 wheels;Bedside commode;Tub bench;Wheelchair - manual          Prior Functioning/Environment Level of Independence: Independent with assistive device(s)        Comments: Per pt, she uses her quad cane and sometimes  furniture cruises. She reports she is independent with ADLs and explained complicated method of getting into bathtub despite having tub bench ("I fell off it once").     OT Diagnosis: Generalized weakness;Acute pain   OT Problem List: Decreased activity tolerance;Impaired balance (sitting and/or standing)   OT Treatment/Interventions: Self-care/ADL training;Therapeutic exercise;Energy conservation;DME and/or AE instruction;Therapeutic activities;Patient/family education;Balance  training    OT Goals(Current goals can be found in the care plan section) Acute Rehab OT Goals Patient Stated Goal: to get home OT Goal Formulation: With patient Time For Goal Achievement: 05/08/15 Potential to Achieve Goals: Good ADL Goals Pt Will Perform Grooming: with supervision;standing Pt Will Perform Lower Body Bathing: with set-up;with supervision;with adaptive equipment;sit to/from stand Pt Will Perform Lower Body Dressing: with set-up;with supervision;with adaptive equipment;sit to/from stand Pt Will Transfer to Toilet: with supervision;ambulating  OT Frequency: Min 2X/week    End of Session Equipment Utilized During Treatment: Rolling walker;Gait belt Nurse Communication: Mobility status  Activity Tolerance: Patient tolerated treatment well Patient left: in chair;with call bell/phone within reach   Time: 1123-1148 OT Time Calculation (min): 25 min Charges:  OT General Charges $OT Visit: 1 Procedure OT Evaluation $Initial OT Evaluation Tier I: 1 Procedure OT Treatments $Self Care/Home Management : 8-22 mins G-Codes:    Juluis Rainier 19-May-2015, 1:22 PM  Cyndie Chime, OTR/L Occupational Therapist [phone] (pager)

## 2015-04-24 NOTE — Progress Notes (Signed)
Family Medicine Teaching Service Daily Progress Note Intern Pager: [phone]  Patient name: [name] [name] Medical record number: [id number] Date of birth: Feb 15, 1951 Age: 64 y.o. Gender: female  Primary Care Provider: Tommi Rumps, MD Consultants: none Code Status: Full  Pt Overview and Major Events to Date:  06/07: admitted  Assessment and Plan: [name] [name] is a 64 y.o. female presenting with syncopal episode earlier today, unwitnessed reportedly LOC found by bystander called EMS. PMH is significant for ESRD (on HD MWF, s/p R-nephrectomy), DM2, OA knees, memory loss (fam h/o alzheimer's), GERD, depression.  # Syncopal episode with LOC, unwitnessed without residual symptoms - Improved CT imaging head/neck negative for acute fracture or cranial hemorrhage. No known acute symptoms prior or post, without deficits upon awakening, just amnesia of event. Unclear etiology, without prior syncope before but significant recent history with multiple falls x 2 in past 6 month req ED visit and facial sutures, seemed to be falls related to tripping and no LOC or other symptoms. Multifactorial etiologies contributing to falls with chronic symptoms intermittent balance difficulty and dizziness, DOE, bilateral knee OA / pain and weakness, generalized debilitation/fatigue with chronic illness ESRD. Repeat EKG with nonspecific t wave abnormality.  Trop 0.05>0.04>0.06. Mildly elevated in ESRD). Had recent TSH 05/2014 normal, didn't repeat.  Last ECHO 02/2013 (normal, LVEF 42-68%, no diastolic dysfunction), BNP 3990.5 (could be increased 2/2 ESRD) - telemetry, observation for monitoring - Vitals per protocol, orthostatics positive - repeat ECHO EF 35% Severe hypokinesis of inferior wall and the septum (new).  Wall thickness was increased in a pattern of mild LVH PA peak pressure: 44 mm Hg - PT/OT eval: HH OT - c/s CM for HH needs - Recommend compression stockings but patient not tolerating.  Would continue  to reinforce to patient to change positions very slowly. - Patient with bradycardia to 57-62.  Obtain TSH  # ESRD, non-oliguric, on HD (M-W-F) Followed by [facility], HD for last 1.5 yr, s/p R-nephrectomy, received last HD yesterday Mon 6/6. Not missed any recent HD - Access LUE-AVF - Renal diet, fluid restrict, Calcium carbonate - Monitor I/O, BPs, labs, volume status, likely follow-up with normal HD outpatient Weds or inpatient if still admit - Discussed patient's case with Dr Deterding, nephrologist.  Discussed possible use of Midodrine vs taking less fluid off at HD.  He reports to me that patient has had ongoing issues with LOC/falls even on nonHD days.  He also states that patient also often comes in fluid overloaded and that she is already getting reduced amounts of fluid taken of in the setting of orthostasis.  Also reports patient's BPs are usually very high at HD, so midodrine likely not a good choice.  Patient apparently has ambulatory devices at home but does not like to use them.  Recommends pressure stockings at this time.  Appreciate time and assistance by renal.   - c/s renal: HD today  # DM2, controlled  Last A1c 6.1 (05/2014). On Lantus 3u qhs at home. - Hold Lantus currently, consider resuming if remains inpatient - Sensitive SSI, check CBG q AC hs  # OA Knees, bilateral, chronic - Currently controoled, without any pain  # Memory loss, chronic, stable - Family h/o Alzheimer's dementia. Prev work-up for reversible etiologies neg. MMSE 27/30 on 07/12/14  # GERD - Continue home PPI  # Lower ext superficial blister, ?cellulitis  Afebrile, no WBCs - Recently treated outpatient HD with Doxy and Bactrim for superficial skin ulceration, not impressive on exam  and do not see significant findings. - Hold antibiotics at this time  # H/o Depression - Continue Venlafaxine-XR 75mg  daily  FEN/GI: SLIV / Renal / carb mod diet with fluid restriction. Renal-MVI Prophylaxis: Heparin  SQ  Disposition: Will discharge home after PT assessment and HH needs in place.  Subjective:  Patient reports that she is feeling well this morning.  Denies dizziness, CP, SOB this am.  Pain is stable.  She does endorse some nausea and vomiting.  She voices good understanding of today's plan.  Objective: Temp:  [97.5 F (36.4 C)-98.3 F (36.8 C)] 97.5 F (36.4 C) (06/08 0543) Pulse Rate:  [57-62] 59 (06/08 0543) Resp:  [16-18] 16 (06/08 0543) BP: (130-149)/(75-84) 145/75 mmHg (06/08 0543) SpO2:  [95 %-97 %] 97 % (06/08 0543) Weight:  [189 lb 14.4 oz (86.138 kg)] 189 lb 14.4 oz (86.138 kg) (06/08 8469) Physical Exam: General: awake, alert, NAD HEENT: R eye with marked edema and ecchymosis, inferior aspect of L eye with ecchymosis as well, +TTP, patient not able to open R eye 2/2 swelling Cardiovascular: RRR, no murmurs Respiratory: CTAB, no increased WOB Abdomen: soft, NT/ND, +BS Extremities: WWP, LUE with fistula w/ palpable thrill, +2 pedal edema Neuro: AOx3, no focal deficits (ocular exam not able to be performed 2/2 eye swelling) Psych: normal speech, mood stable  Laboratory:  Recent Labs Lab 04/22/15 2147 04/23/15 0422  WBC 5.5 5.7  HGB 11.3* 11.2*  HCT 33.5* 32.9*  PLT 105* 125*    Recent Labs Lab 04/22/15 2147 04/23/15 1020  NA 135 134*  K 3.4* 3.2*  CL 96* 95*  CO2 27 26  BUN 28* 34*  CREATININE 3.68* 4.67*  CALCIUM 8.2* 8.4*  GLUCOSE 204* 145*    Imaging/Diagnostic Tests: Dg Chest 2 View  04/23/2015   CLINICAL DATA:  Fall with right hip pain.  Shortness of breath.  EXAM: CHEST  2 VIEW  COMPARISON:  02/21/2013  FINDINGS: The heart is enlarged. Diffuse reticular opacities suggesting pulmonary edema. No confluent airspace disease. No pleural effusion. No pneumothorax. No acute osseous abnormalities are seen. Hyperdensity in the left upper abdomen is likely related to ingested material.  IMPRESSION: Cardiomegaly and pulmonary edema.  Correlation for CHF  recommended.   Electronically Signed   By: Jeb Levering M.D.   On: 04/23/2015 00:05   Ct Head Wo Contrast  04/22/2015   CLINICAL DATA:  Unwitnessed fall with large right orbital hematoma, initial encounter  EXAM: CT HEAD WITHOUT CONTRAST  CT MAXILLOFACIAL WITHOUT CONTRAST  CT CERVICAL SPINE WITHOUT CONTRAST  TECHNIQUE: Multidetector CT imaging of the head, cervical spine, and maxillofacial structures were performed using the standard protocol without intravenous contrast. Multiplanar CT image reconstructions of the cervical spine and maxillofacial structures were also generated.  COMPARISON:  None.  FINDINGS: CT HEAD FINDINGS  Bony calvarium is intact. A large soft tissue hematoma is noted about the right orbit and right cheek consistent with the patient's recent injury. Mild atrophic changes are noted. No findings to suggest acute hemorrhage, acute infarction or space-occupying mass lesion are noted.  CT MAXILLOFACIAL FINDINGS  Significant motion artifact is identified limiting the exam. No definitive fracture is seen. Significant soft tissue swelling is again noted in the region of the right cheek and right orbit. A focal 2.5 cm hematoma with a fluid fluid level is noted just lateral to the right orbit. The orbits and their contents are within normal limits. Dental caries are noted. No other focal abnormality is seen.  CT  CERVICAL SPINE FINDINGS  Seven cervical segments are well visualized. Multilevel disc space narrowing is noted with mild associated osteophytic changes. Facet hypertrophic changes are seen. No acute fracture or acute facet abnormality is noted. The surrounding soft tissues are within normal limits.  IMPRESSION: CT of the head: Atrophic changes without acute intracranial abnormality.  Significant right facial hematoma.  CT of the maxillofacial bones: Considerable right facial soft tissue changes consistent with a recent injury. A focal 2.5 cm hematoma is noted just lateral to the right  orbit. No acute fracture is identified.  CT of the cervical spine: Multilevel degenerative change without acute abnormality.   Electronically Signed   By: Inez Catalina M.D.   On: 04/22/2015 22:53   Ct Cervical Spine Wo Contrast  04/22/2015   CLINICAL DATA:  Unwitnessed fall with large right orbital hematoma, initial encounter  EXAM: CT HEAD WITHOUT CONTRAST  CT MAXILLOFACIAL WITHOUT CONTRAST  CT CERVICAL SPINE WITHOUT CONTRAST  TECHNIQUE: Multidetector CT imaging of the head, cervical spine, and maxillofacial structures were performed using the standard protocol without intravenous contrast. Multiplanar CT image reconstructions of the cervical spine and maxillofacial structures were also generated.  COMPARISON:  None.  FINDINGS: CT HEAD FINDINGS  Bony calvarium is intact. A large soft tissue hematoma is noted about the right orbit and right cheek consistent with the patient's recent injury. Mild atrophic changes are noted. No findings to suggest acute hemorrhage, acute infarction or space-occupying mass lesion are noted.  CT MAXILLOFACIAL FINDINGS  Significant motion artifact is identified limiting the exam. No definitive fracture is seen. Significant soft tissue swelling is again noted in the region of the right cheek and right orbit. A focal 2.5 cm hematoma with a fluid fluid level is noted just lateral to the right orbit. The orbits and their contents are within normal limits. Dental caries are noted. No other focal abnormality is seen.  CT CERVICAL SPINE FINDINGS  Seven cervical segments are well visualized. Multilevel disc space narrowing is noted with mild associated osteophytic changes. Facet hypertrophic changes are seen. No acute fracture or acute facet abnormality is noted. The surrounding soft tissues are within normal limits.  IMPRESSION: CT of the head: Atrophic changes without acute intracranial abnormality.  Significant right facial hematoma.  CT of the maxillofacial bones: Considerable right facial  soft tissue changes consistent with a recent injury. A focal 2.5 cm hematoma is noted just lateral to the right orbit. No acute fracture is identified.  CT of the cervical spine: Multilevel degenerative change without acute abnormality.   Electronically Signed   By: Inez Catalina M.D.   On: 04/22/2015 22:53   Dg Hip Unilat With Pelvis 2-3 Views Right  04/23/2015   CLINICAL DATA:  Fall, RIGHT hip pain.  EXAM: RIGHT HIP (WITH PELVIS) 2-3 VIEWS  COMPARISON:  None.  FINDINGS: There is no evidence of hip fracture or dislocation. There is mild RIGHT hip joint space narrowing. No pelvic fracture. Fecal impaction.  IMPRESSION: No acute hip fracture is evident.  Degenerative change.   Electronically Signed   By: Rolla Flatten M.D.   On: 04/23/2015 00:08   Ct Maxillofacial Wo Cm  04/22/2015   CLINICAL DATA:  Unwitnessed fall with large right orbital hematoma, initial encounter  EXAM: CT HEAD WITHOUT CONTRAST  CT MAXILLOFACIAL WITHOUT CONTRAST  CT CERVICAL SPINE WITHOUT CONTRAST  TECHNIQUE: Multidetector CT imaging of the head, cervical spine, and maxillofacial structures were performed using the standard protocol without intravenous contrast. Multiplanar CT image reconstructions  of the cervical spine and maxillofacial structures were also generated.  COMPARISON:  None.  FINDINGS: CT HEAD FINDINGS  Bony calvarium is intact. A large soft tissue hematoma is noted about the right orbit and right cheek consistent with the patient's recent injury. Mild atrophic changes are noted. No findings to suggest acute hemorrhage, acute infarction or space-occupying mass lesion are noted.  CT MAXILLOFACIAL FINDINGS  Significant motion artifact is identified limiting the exam. No definitive fracture is seen. Significant soft tissue swelling is again noted in the region of the right cheek and right orbit. A focal 2.5 cm hematoma with a fluid fluid level is noted just lateral to the right orbit. The orbits and their contents are within normal  limits. Dental caries are noted. No other focal abnormality is seen.  CT CERVICAL SPINE FINDINGS  Seven cervical segments are well visualized. Multilevel disc space narrowing is noted with mild associated osteophytic changes. Facet hypertrophic changes are seen. No acute fracture or acute facet abnormality is noted. The surrounding soft tissues are within normal limits.  IMPRESSION: CT of the head: Atrophic changes without acute intracranial abnormality.  Significant right facial hematoma.  CT of the maxillofacial bones: Considerable right facial soft tissue changes consistent with a recent injury. A focal 2.5 cm hematoma is noted just lateral to the right orbit. No acute fracture is identified.  CT of the cervical spine: Multilevel degenerative change without acute abnormality.   Electronically Signed   By: Inez Catalina M.D.   On: 04/22/2015 22:53   Janora Norlander, DO 04/24/2015, 7:31 AM PGY-1, [place] Intern pager: [phone], text pages welcome

## 2015-04-24 NOTE — Consult Note (Signed)
   [facility] CM Inpatient Consult   04/24/2015  Dnyla Antonetti Deane Dec 13, 1950 [id number]  Referral received for [facility] Care Management services. Patient is not in a [facility] risk affiliated contract. Therefore, patient is not eligible for [facility] Care Management. Will make inpatient RNCM aware and alert requestor.   Marthenia Rolling, MSN-Ed, RN,BSN [facility] Liaison [phone]

## 2015-04-24 NOTE — Evaluation (Signed)
Physical Therapy Evaluation Patient Details Name: [name] [name] MRN: [id number] DOB: Jan 18, 1951 Today's Date: 04/24/2015   History of Present Illness  Pt is a 64 y.o. Female admitted 04/22/15 with syncopal episode resulting in fall with facial injuries and eye lid hematoma. CT head/neck negative for acute fx or cranial hemorrhage. PMH ESRD (HD on MWF, s/p R- nephrectomy), DM2, OA, memory loss, GERD, depression. Pt with 2 recent falls in past 6 months.    Clinical Impression  Pt admitted with above diagnosis. Pt currently with functional limitations due to the deficits listed below (see PT Problem List). Pt will benefit from skilled PT to increase their independence and safety with mobility to allow discharge home.  Pt understands her home situation is not ideal and she is at risk for more falls and injury and wants to return home. She and her husband are trying to figure out possible solutions such as having someone stay with them that can help around the house and yard.      Follow Up Recommendations Home health PT;Supervision - Intermittent (Pt reports she can do all the things the HHPT will do. )    Equipment Recommendations  None recommended by PT    Recommendations for Other Services       Precautions / Restrictions Precautions Precautions: Fall Restrictions Weight Bearing Restrictions: No      Mobility  Bed Mobility Overal bed mobility:  (NT, pt up in chair and did not return to ed)             General bed mobility comments: Pt sitting in recliner when therapy arrived  Transfers Overall transfer level: Needs assistance Equipment used: Rolling walker (2 wheeled) Transfers: Sit to/from Stand Sit to Stand: Min guard         General transfer comment: Did not require physical assist, just gaurding  Ambulation/Gait Ambulation/Gait assistance: Min guard Ambulation Distance (Feet): 80 Feet Assistive device: Rolling walker (2 wheeled) Gait Pattern/deviations:  Step-through pattern;Decreased stride length   Gait velocity interpretation: <1.8 ft/sec, indicative of risk for recurrent falls General Gait Details: once loss of balance which pt self corrected  Stairs            Wheelchair Mobility    Modified Rankin (Stroke Patients Only)       Balance Overall balance assessment: Needs assistance Sitting-balance support: No upper extremity supported Sitting balance-Leahy Scale: Normal       Standing balance-Leahy Scale: Fair                 High Level Balance Comments: Recommend pt use RW at home as she is steadier when holding on to somethinkg with both hands             Pertinent Vitals/Pain Pain Assessment: Faces Faces Pain Scale: Hurts even more Pain Location: right knee Pain Intervention(s): Monitored during session    Home Living Family/patient expects to be discharged to:: Private residence Living Arrangements: Spouse/significant other Available Help at Discharge: Family;Available 24 hours/day (Husband present, but has significant medical issues himself) Type of Home: House Home Access: Level entry     Home Layout: One level Home Equipment: Cane - single point;Walker - 4 wheels;Bedside commode;Tub bench;Wheelchair - manual Additional Comments: Pt drives, but typically takes SCAT to HD appointments    Prior Function Level of Independence: Independent with assistive device(s)         Comments: Pt reports using 4 wheeled walkeer when out of the house, but usually uses quad cane in  the house as well as holding to furniture     [place]: Right    Extremity/Trunk Assessment   Upper Extremity Assessment: Defer to OT evaluation           Lower Extremity Assessment: Generalized weakness      Cervical / Trunk Assessment: Normal  Communication   Communication: HOH  Cognition Arousal/Alertness: Awake/alert Behavior During Therapy: WFL for tasks assessed/performed Overall  Cognitive Status: No family/caregiver present to determine baseline cognitive functioning                      General Comments General comments (skin integrity, edema, etc.): No family present.  Pt alert and cooperative. She reports to me that she feels like she is abaout at baseline for mobiity. She is unsure why she had the most recent fall and reports past falls have been from legs giving out or losing balance. She states her and her husband are in the process of trying to get additional help around the house, but have not come up with a plan yet.      Exercises        Assessment/Plan    PT Assessment Patient needs continued PT services  PT Diagnosis Generalized weakness   PT Problem List Decreased strength;Decreased activity tolerance;Decreased balance;Decreased mobility;Decreased knowledge of use of DME;Pain  PT Treatment Interventions DME instruction;Gait training;Therapeutic activities;Therapeutic exercise;Balance training;Patient/family education   PT Goals (Current goals can be found in the Care Plan section) Acute Rehab PT Goals Patient Stated Goal: to return home soon. Pt worried about her sister and husband PT Goal Formulation: With patient Time For Goal Achievement: 05/01/15 Potential to Achieve Goals: Good    Frequency Min 3X/week   Barriers to discharge        Co-evaluation               End of Session Equipment Utilized During Treatment: Gait belt Activity Tolerance: Patient tolerated treatment well Patient left: in chair;with call bell/phone within reach Nurse Communication: Mobility status         Time: 1610-9604 PT Time Calculation (min) (ACUTE ONLY): 32 min   Charges:   PT Evaluation $Initial PT Evaluation Tier I: 1 Procedure PT Treatments $Gait Training: 8-22 mins   PT G CodesMelvern Banker 04/24/2015, 3:12 PM  Lavonia Dana, PT  [phone] 04/24/2015

## 2015-04-25 ENCOUNTER — Encounter (HOSPITAL_COMMUNITY)
Admission: EM | Disposition: A | Discharge status: Skilled Nursing Facility | Payer: BLUE CROSS/BLUE SHIELD | Source: Home / Self Care | Attending: Family Medicine

## 2015-04-25 ENCOUNTER — Encounter (HOSPITAL_COMMUNITY): Payer: Self-pay | Admitting: Interventional Cardiology

## 2015-04-25 ENCOUNTER — Other Ambulatory Visit: Payer: BLUE CROSS/BLUE SHIELD | Admitting: *Deleted

## 2015-04-25 DIAGNOSIS — I2510 Atherosclerotic heart disease of native coronary artery without angina pectoris: Secondary | ICD-10-CM

## 2015-04-25 HISTORY — PX: CARDIAC CATHETERIZATION: SHX172

## 2015-04-25 LAB — CBC
HCT: 34.2 % — ABNORMAL LOW (ref 36.0–46.0)
HEMATOCRIT: 31.6 % — AB (ref 36.0–46.0)
HEMOGLOBIN: 10.9 g/dL — AB (ref 12.0–15.0)
HEMOGLOBIN: 11.4 g/dL — AB (ref 12.0–15.0)
MCH: 34.6 pg — ABNORMAL HIGH (ref 26.0–34.0)
MCH: 33.6 pg (ref 26.0–34.0)
MCHC: 34.5 g/dL (ref 30.0–36.0)
MCHC: 33.3 g/dL (ref 30.0–36.0)
MCV: 100.3 fL — AB (ref 78.0–100.0)
MCV: 100.9 fL — AB (ref 78.0–100.0)
PLATELETS: 93 10*3/uL — AB (ref 150–400)
Platelets: 105 10*3/uL — ABNORMAL LOW (ref 150–400)
RBC: 3.15 MIL/uL — ABNORMAL LOW (ref 3.87–5.11)
RBC: 3.39 MIL/uL — ABNORMAL LOW (ref 3.87–5.11)
RDW: 14.1 % (ref 11.5–15.5)
RDW: 14.1 % (ref 11.5–15.5)
WBC: 5.9 10*3/uL (ref 4.0–10.5)
WBC: 4.5 10*3/uL (ref 4.0–10.5)

## 2015-04-25 LAB — RENAL FUNCTION PANEL
ANION GAP: 12 (ref 5–15)
Albumin: 3.2 g/dL — ABNORMAL LOW (ref 3.5–5.0)
BUN: 27 mg/dL — ABNORMAL HIGH (ref 6–20)
CALCIUM: 8.3 mg/dL — AB (ref 8.9–10.3)
CO2: 26 mmol/L (ref 22–32)
Chloride: 95 mmol/L — ABNORMAL LOW (ref 101–111)
Creatinine, Ser: 4.02 mg/dL — ABNORMAL HIGH (ref 0.44–1.00)
GFR, EST AFRICAN AMERICAN: 12 mL/min — AB (ref >60)
GFR, EST NON AFRICAN AMERICAN: 11 mL/min — AB (ref >60)
GLUCOSE: 91 mg/dL (ref 65–99)
PHOSPHORUS: 3.5 mg/dL (ref 2.5–4.6)
Potassium: 3.9 mmol/L (ref 3.5–5.1)
SODIUM: 133 mmol/L — AB (ref 135–145)

## 2015-04-25 LAB — GLUCOSE, CAPILLARY
GLUCOSE-CAPILLARY: 90 mg/dL (ref 65–99)
GLUCOSE-CAPILLARY: 78 mg/dL (ref 65–99)
GLUCOSE-CAPILLARY: 98 mg/dL (ref 65–99)
Glucose-Capillary: 104 mg/dL — ABNORMAL HIGH (ref 65–99)
Glucose-Capillary: 116 mg/dL — ABNORMAL HIGH (ref 65–99)

## 2015-04-25 LAB — PROTIME-INR
INR: 1.14 (ref 0.00–1.49)
Prothrombin Time: 14.8 seconds (ref 11.6–15.2)

## 2015-04-25 LAB — BLOOD GAS, ARTERIAL
Acid-base deficit: 0.2 mmol/L (ref 0.0–2.0)
Bicarbonate: 23.9 mEq/L (ref 20.0–24.0)
Drawn by: 44066
FIO2: 0.21 %
O2 Saturation: 86.7 %
Patient temperature: 97.7
TCO2: 25.1 mmol/L (ref 0–100)
pCO2 arterial: 37.9 mmHg (ref 35.0–45.0)
pH, Arterial: 7.413 (ref 7.350–7.450)
pO2, Arterial: 54.2 mmHg — ABNORMAL LOW (ref 80.0–100.0)

## 2015-04-25 LAB — SURGICAL PCR SCREEN
MRSA, PCR: NEGATIVE
Staphylococcus aureus: NEGATIVE

## 2015-04-25 LAB — BASIC METABOLIC PANEL
Anion gap: 12 (ref 5–15)
BUN: 23 mg/dL — ABNORMAL HIGH (ref 6–20)
CHLORIDE: 98 mmol/L — AB (ref 101–111)
CO2: 26 mmol/L (ref 22–32)
Calcium: 8.4 mg/dL — ABNORMAL LOW (ref 8.9–10.3)
Creatinine, Ser: 3.7 mg/dL — ABNORMAL HIGH (ref 0.44–1.00)
GFR, EST AFRICAN AMERICAN: 14 mL/min — AB (ref >60)
GFR, EST NON AFRICAN AMERICAN: 12 mL/min — AB (ref >60)
GLUCOSE: 96 mg/dL (ref 65–99)
Potassium: 3.9 mmol/L (ref 3.5–5.1)
SODIUM: 136 mmol/L (ref 135–145)

## 2015-04-25 LAB — TSH: TSH: 1.974 u[IU]/mL (ref 0.350–4.500)

## 2015-04-25 LAB — HEPATITIS B SURFACE ANTIGEN: Hepatitis B Surface Ag: NEGATIVE

## 2015-04-25 SURGERY — LEFT HEART CATH AND CORONARY ANGIOGRAPHY

## 2015-04-25 MED ORDER — ONDANSETRON HCL 4 MG/2ML IJ SOLN: 4.0000 mg | Freq: Four times a day (QID) | INTRAMUSCULAR | Status: DC | PRN

## 2015-04-25 MED ORDER — SODIUM CHLORIDE 0.9 % IJ SOLN: 3.0000 mL | INTRAMUSCULAR | Status: DC | PRN

## 2015-04-25 MED ORDER — SODIUM CHLORIDE 0.9 % IV SOLN: 100.0000 mL | INTRAVENOUS | Status: DC | PRN

## 2015-04-25 MED ORDER — ALTEPLASE 2 MG IJ SOLR
2.0000 mg | Freq: Once | INTRAMUSCULAR | Status: AC | PRN
  Filled 2015-04-25: qty 2

## 2015-04-25 MED ORDER — HEPARIN SODIUM (PORCINE) 1000 UNIT/ML DIALYSIS
1000.0000 [IU] | INTRAMUSCULAR | Status: DC | PRN
  Filled 2015-04-25: qty 1

## 2015-04-25 MED ORDER — OXYCODONE-ACETAMINOPHEN 5-325 MG PO TABS
1.0000 | ORAL_TABLET | ORAL | Status: DC | PRN
  Administered 2015-04-26 (×2): 1 via ORAL
  Filled 2015-04-25 (×2): qty 1

## 2015-04-25 MED ORDER — SODIUM CHLORIDE 0.9 % IJ SOLN: 3.0000 mL | Freq: Two times a day (BID) | INTRAMUSCULAR | Status: DC

## 2015-04-25 MED ORDER — IOHEXOL 350 MG/ML SOLN
INTRAVENOUS | Status: DC | PRN
  Administered 2015-04-25: 110 mL via INTRAVENOUS

## 2015-04-25 MED ORDER — SODIUM CHLORIDE 0.9 % IV SOLN: 250.0000 mL | INTRAVENOUS | Status: DC | PRN

## 2015-04-25 MED ORDER — LIDOCAINE HCL (PF) 1 % IJ SOLN
5.0000 mL | INTRAMUSCULAR | Status: DC | PRN
Start: 2015-04-25 — End: 2015-04-26

## 2015-04-25 MED ORDER — PENTAFLUOROPROP-TETRAFLUOROETH EX AERO
1.0000 "application " | INHALATION_SPRAY | CUTANEOUS | Status: DC | PRN
  Filled 2015-04-25: qty 30

## 2015-04-25 MED ORDER — HEPARIN (PORCINE) IN NACL 2-0.9 UNIT/ML-% IJ SOLN
INTRAMUSCULAR | Status: AC
  Filled 2015-04-25: qty 1000

## 2015-04-25 MED ORDER — LIDOCAINE-PRILOCAINE 2.5-2.5 % EX CREA
1.0000 "application " | TOPICAL_CREAM | CUTANEOUS | Status: DC | PRN
  Filled 2015-04-25: qty 5

## 2015-04-25 MED ORDER — NEPRO/CARBSTEADY PO LIQD
237.0000 mL | ORAL | Status: DC | PRN
  Filled 2015-04-25: qty 237

## 2015-04-25 MED ORDER — LIDOCAINE HCL (PF) 1 % IJ SOLN
INTRAMUSCULAR | Status: AC
  Filled 2015-04-25: qty 30

## 2015-04-25 SURGICAL SUPPLY — 13 items
CATH INFINITI 5 FR JL3.5 (CATHETERS) IMPLANT
CATH INFINITI JR4 5F (CATHETERS) ×2 IMPLANT
CATH SITESEER 5F MULTI A 2 (CATHETERS) ×2 IMPLANT
DEVICE RAD COMP TR BAND LRG (VASCULAR PRODUCTS) IMPLANT
DEVICE WIRE ANGIOSEAL 6FR (Vascular Products) ×2 IMPLANT
GLIDESHEATH SLEND A-KIT 6F 22G (SHEATH) IMPLANT
KIT HEART LEFT (KITS) ×2 IMPLANT
PACK CARDIAC CATHETERIZATION (CUSTOM PROCEDURE TRAY) ×2 IMPLANT
SHEATH PINNACLE 5F 10CM (SHEATH) ×2 IMPLANT
TRANSDUCER W/STOPCOCK (MISCELLANEOUS) ×2 IMPLANT
TUBING CIL FLEX 10 FLL-RA (TUBING) IMPLANT
WIRE EMERALD 3MM-J .035X150CM (WIRE) ×2 IMPLANT
WIRE SAFE-T 1.5MM-J .035X260CM (WIRE) IMPLANT

## 2015-04-25 NOTE — Progress Notes (Signed)
Family Medicine Teaching Service Daily Progress Note Intern Pager: [phone]  Patient name: [name] [name] Medical record number: [id number] Date of birth: 1951/02/25 Age: 64 y.o. Gender: female  Primary Care Provider: Tommi Rumps, MD Consultants: none Code Status: Full  Pt Overview and Major Events to Date:  06/07: admitted  Assessment and Plan: [name] [name] is a 64 y.o. female presenting with syncopal episode earlier today, unwitnessed reportedly LOC found by bystander called EMS. PMH is significant for ESRD (on HD MWF, s/p R-nephrectomy), DM2, OA knees, memory loss (fam h/o alzheimer's), GERD, depression.  # Syncopal episode with LOC, unwitnessed without residual symptoms - Improved Repeat EKG with nonspecific t wave abnormality.  Trop 0.05>0.04>0.06. Mildly elevated in ESRD). Had recent TSH 05/2014 normal, didn't repeat.  Last ECHO 02/2013 (normal, LVEF 63-78%, no diastolic dysfunction), BNP 3990.5 (could be increased 2/2 ESRD) Repeat ECHO EF 35% Severe hypokinesis of inferior wall and the septum (new).  Wall thickness was increased in a pattern of mild LVH PA peak pressure: 44 mm Hg - telemetry, observation for monitoring - Vitals per protocol, orthostatics positive - PT/OT eval: HH OT - c/s CM for HH needs - Recommend compression stockings but patient not tolerating.  Would continue to reinforce to patient to change positions very slowly. - c/s cardiology  - recommending inpatient evaluation for ischemia given new inferior wall motion abnormalities, LHC  - No driving x6 months  - Will need loop recorder  # ESRD, non-oliguric, on HD (M-W-F) Followed by [facility], HD for last 1.5 yr, s/p R-nephrectomy, received last HD yesterday Mon 6/6. Not missed any recent HD - Access LUE-AVF - Renal diet, fluid restrict, Calcium carbonate - Monitor I/O, BPs, labs, volume status, likely follow-up with normal HD outpatient Weds or inpatient if still admit - c/s renal: appreciate  recs  # DM2, controlled  Last A1c 6.1 (05/2014). On Lantus 3u qhs at home. - Hold Lantus currently, consider resuming if remains inpatient - Sensitive SSI, check CBG q AC hs  # OA Knees, bilateral, chronic - Currently controoled, without any pain  # Memory loss, chronic, stable - Family h/o Alzheimer's dementia. Prev work-up for reversible etiologies neg. MMSE 27/30 on 07/12/14  # GERD - Continue home PPI  # Lower ext superficial blister, ?cellulitis  Afebrile, no WBCs - Recently treated outpatient HD with Doxy and Bactrim for superficial skin ulceration, not impressive on exam and do not see significant findings. - Hold antibiotics at this time  # H/o Depression - Continue Venlafaxine-XR 75mg  daily  FEN/GI: Lizbeth Bark MDN for LHC. Renal-MVI Prophylaxis: Heparin SQ  Disposition: Will discharge home after cards assessment and HH needs in place.  Subjective:  Patient feeling ok.  We discussed options for continued monitoring at home.  Patient reports that there are no friends or family available to help patient and her husband at home or that they can live with.  She reports that she would be amenable to an assisted living facility but that money is an issue.  She denies CP, SOB, N/V.  She reports pain in R eye is improving.    Objective: Temp:  [97.4 F (36.3 C)-98.6 F (37 C)] 97.6 F (36.4 C) (06/09 0553) Pulse Rate:  [54-77] 62 (06/09 0553) Resp:  [15-21] 16 (06/09 0553) BP: (94-179)/(42-98) 148/81 mmHg (06/09 0553) SpO2:  [95 %-99 %] 97 % (06/09 0553) Weight:  [187 lb 6.3 oz (85 kg)-192 lb 3.9 oz (87.2 kg)] 189 lb 6 oz (85.9 kg) (06/09 0553)  Physical Exam: General: awake, alert, NAD HEENT: face streaked with gravity dependent ecchymosis, R eye with marked edema and ecchymosis, inferior aspect of L eye with ecchymosis as well, +TTP, patient not able to open R eye 2/2 swelling, have to manually open R eye and EOMI, scant sticky yellow discharge on eyelid Cardiovascular: RRR,  no murmurs Respiratory: CTAB, no increased WOB Abdomen: soft, NT/ND, +BS Extremities: WWP, LUE with fistula w/ palpable thrill, +2 pedal edema Neuro: AOx3, no focal deficits  Psych: normal speech, mood stable  Laboratory:  Recent Labs Lab 04/22/15 2147 04/23/15 0422  WBC 5.5 5.7  HGB 11.3* 11.2*  HCT 33.5* 32.9*  PLT 105* 125*    Recent Labs Lab 04/22/15 2147 04/23/15 1020 04/24/15 0820  NA 135 134* 132*  K 3.4* 3.2* 3.7  CL 96* 95* 94*  CO2 27 26 24   BUN 28* 34* 52*  CREATININE 3.68* 4.67* 6.07*  CALCIUM 8.2* 8.4* 9.1  GLUCOSE 204* 145* 101*    Imaging/Diagnostic Tests: No results found. Janora Norlander, DO 04/25/2015, 7:08 AM PGY-1, [place] Intern pager: [phone], text pages welcome

## 2015-04-25 NOTE — Interval H&P Note (Signed)
Cath Lab Visit (complete for each Cath Lab visit)  Clinical Evaluation Leading to the Procedure:   ACS: No.  Non-ACS:    Anginal Classification: CCS III  Anti-ischemic medical therapy: No Therapy  Non-Invasive Test Results: No non-invasive testing performed  Prior CABG: No previous CABG      History and Physical Interval Note:  04/25/2015 10:07 AM  [name] [name]  has presented today for surgery, with the diagnosis of snycope  The various methods of treatment have been discussed with the patient and family. After consideration of risks, benefits and other options for treatment, the patient has consented to  Procedure(s): Left Heart Cath and Coronary Angiography (N/A) as a surgical intervention .  The patient's history has been reviewed, patient examined, no change in status, stable for surgery.  I have reviewed the patient's chart and labs.  Questions were answered to the patient's satisfaction.     [name]

## 2015-04-25 NOTE — Progress Notes (Signed)
CM f/u with pt regarding [facility] therapy, pt verbalized readiness and open to hearing about [facility] services. CM explained to pt after PT/OT evaluates  and make recommendations CM will f/u .

## 2015-04-25 NOTE — Discharge Summary (Signed)
[facility] Discharge Summary  Patient name: [name] [name] Medical record number: [id number] Date of birth: November 13, 1951 Age: 64 y.o. Gender: female Date of Admission: 04/22/2015  Date of Discharge: 04/29/2015 Admitting Physician: Lupita Dawn, MD  Primary Care Provider: Tommi Rumps, MD Consultants: renal, cardiology, EP, cardiothoracic surgery  Indication for Hospitalization: loss of consciousness with facial contusion  Discharge Diagnoses/Problem List:  T2DM ESRD Loss of consciousness Fall Facial hematoma CAD in native artery  Disposition: Discharge to SNF  Discharge Condition: Stable  Discharge Exam:  Blood pressure 144/72, pulse 57, temperature 98 F (36.7 C), temperature source Oral, resp. rate 18, height 5\' 5"  (1.651 m), weight 196 lb 12.8 oz (89.268 kg), SpO2 94 %. General: sitting up in bed, comfortable, NAD Eyes: Right upper/lower eyelids with residual swelling but greatly improved since admission.  Patient able to open both eyes. PERRL, EOMI HENTM: Right facial superficial laceration with hemostasis lateral to R-orbit, facial hematoma improving.  Oropharynx clear. MMM. Poor dentition Neck: supple, non-tender, normal ROM Cardiovascular: bradycardic, regular rhythm, no murmurs Respiratory: CTAB. No wheezing. Good air movement. Non-labored. Abdomen: Soft, NTND, +active BS MSK: WWP, R LE with healing lesion, no evidence of continued infection Ext: LUE AVF, bruit heard with palpable vibration Skin: warm, dry, multiple skin abrasions bilateral lower legs with scabbed healing wounds, no evidence of infection (without erythema, abscess, drainage or bleeding). Darked pigment across bilateral lower ext with chronic venous changes  Neuro: AOx3, no focal deficits,  Psych: normal mood and affect, normal speech  Brief Hospital Course:  [name] [name] is a 64 y.o. female that presented with syncopal episode earlier today, unwitnessed reportedly  LOC found by bystander called EMS. PMH is significant for ESRD (on HD MWF, s/p R-nephrectomy), DM2, OA knees, memory loss (fam h/o alzheimer's), GERD, depression.  In the ED, CT head was negative for acute fracture or hemorrhage.  EKG with NSR with Type 1 AV block and nonspecific T wave changes in lateral leads.  Patient was admitted for FPTS for further evaluation and management.  Orthostatic VS were Positive.  Troponin mildly elevated 0.05>0.04>0.06.  Repeat EKG with continued non specific T wave changes.  Echo showed EF 35% Severe hypokinesis of inferior wall and the septum (new). Wall thickness was increased in a pattern of mild LVH PA peak pressure: 44 mm Hg.  Cardiology was consulted in the setting of worsened heart function.  Ischemic evaluation was performed.  Patient underwent cardiac catheterization, which revealed severe multivessel disease and inferior wall hypokinesis with an EF 45%.  Recommendation was ultimately to consider CABG.  In the setting of her co-morbidities and recent facial trauma, she was started on medical therapy.  Patient's blood pressure and HR did not tolerate BB therapy and in the setting of her ESRD an ACE-I/ARB was not initiated.  She was continued only on Imdur, which she tolerated well.  She was evaluated by CT surgery, who also recommended medical therapy as tolerated for now.  EP was consulted, who did not recommend a loop recorder at this time but did recommend a Lifevest.  This was arranged for her and appropriate teaching was performed before discharge with both patient and her family.  PT/OT also evaluated patient and recommended that patient continue therapy at a SNF.  CM was consulted, who helped arrange this.  Cardiac rehabilitation was considered but patient deemed inappropriate for cardiac rehab at this time.  Taper of patient's Effexor was also initiated during hospitalization, in the setting of  multiple falls/LOC.  Patient has 5 days remaining of taper and  should stop medication once complete.    In addition, patient continued to received hemodialysis during hospitalization.  Her electrolytes and fluid balance were closely monitored.       Patient was discharged in [facility] condition to [facility].  Discharge instructions and return precautions were reviewed with patient, who voiced good understanding.  Issues for Follow Up:  1. Depression.  Effexor taper initiated in hospital.  Patient to take 37.5mg  x7 days then off.  Last day 05/05/15.  Would consider alternative in the setting of syncope. 2. Lifevest/Cards f/u  Significant Procedures: cardiac catheterization  Significant Labs and Imaging:   Recent Labs Lab 04/26/15 0305 04/26/15 1230 04/29/15 0320  WBC 6.7 5.7 6.7  HGB 11.0* 10.3* 11.2*  HCT 32.8* 30.6* 33.7*  PLT 115* 116* 126*    Recent Labs Lab 04/25/15 1230 04/26/15 0305 04/26/15 1230 04/27/15 1226 04/29/15 0320  NA 133* 134* 130* 134* 132*  K 3.9 4.1 3.7 3.5 4.1  CL 95* 97* 94* 96* 95*  CO2 26 25 23 26 24   GLUCOSE 91 121* 146* 153* 102*  BUN 27* 36* 39* 22* 41*  CREATININE 4.02* 5.49* 5.83* 4.08* 6.37*  CALCIUM 8.3* 9.0 8.6* 8.4* 9.1  PHOS 3.5  --  4.2  --  4.2  ALKPHOS  --  112  --   --   --   AST  --  28  --   --   --   ALT  --  19  --   --   --   ALBUMIN 3.2* 3.2* 3.1*  --  3.2*   Dg Chest 2 View  04/23/2015   CLINICAL DATA:  Fall with right hip pain.  Shortness of breath.  EXAM: CHEST  2 VIEW  COMPARISON:  02/21/2013  FINDINGS: The heart is enlarged. Diffuse reticular opacities suggesting pulmonary edema. No confluent airspace disease. No pleural effusion. No pneumothorax. No acute osseous abnormalities are seen. Hyperdensity in the left upper abdomen is likely related to ingested material.  IMPRESSION: Cardiomegaly and pulmonary edema.  Correlation for CHF recommended.   Electronically Signed   By: Jeb Levering M.D.   On: 04/23/2015 00:05   Ct Head Wo Contrast  04/22/2015   CLINICAL DATA:  Unwitnessed  fall with large right orbital hematoma, initial encounter  EXAM: CT HEAD WITHOUT CONTRAST  CT MAXILLOFACIAL WITHOUT CONTRAST  CT CERVICAL SPINE WITHOUT CONTRAST  TECHNIQUE: Multidetector CT imaging of the head, cervical spine, and maxillofacial structures were performed using the standard protocol without intravenous contrast. Multiplanar CT image reconstructions of the cervical spine and maxillofacial structures were also generated.  COMPARISON:  None.  FINDINGS: CT HEAD FINDINGS  Bony calvarium is intact. A large soft tissue hematoma is noted about the right orbit and right cheek consistent with the patient's recent injury. Mild atrophic changes are noted. No findings to suggest acute hemorrhage, acute infarction or space-occupying mass lesion are noted.  CT MAXILLOFACIAL FINDINGS  Significant motion artifact is identified limiting the exam. No definitive fracture is seen. Significant soft tissue swelling is again noted in the region of the right cheek and right orbit. A focal 2.5 cm hematoma with a fluid fluid level is noted just lateral to the right orbit. The orbits and their contents are within normal limits. Dental caries are noted. No other focal abnormality is seen.  CT CERVICAL SPINE FINDINGS  Seven cervical segments are well visualized. Multilevel disc space narrowing is  noted with mild associated osteophytic changes. Facet hypertrophic changes are seen. No acute fracture or acute facet abnormality is noted. The surrounding soft tissues are within normal limits.  IMPRESSION: CT of the head: Atrophic changes without acute intracranial abnormality.  Significant right facial hematoma.  CT of the maxillofacial bones: Considerable right facial soft tissue changes consistent with a recent injury. A focal 2.5 cm hematoma is noted just lateral to the right orbit. No acute fracture is identified.  CT of the cervical spine: Multilevel degenerative change without acute abnormality.   Electronically Signed   By: Inez Catalina M.D.   On: 04/22/2015 22:53   Ct Cervical Spine Wo Contrast  04/22/2015   CLINICAL DATA:  Unwitnessed fall with large right orbital hematoma, initial encounter  EXAM: CT HEAD WITHOUT CONTRAST  CT MAXILLOFACIAL WITHOUT CONTRAST  CT CERVICAL SPINE WITHOUT CONTRAST  TECHNIQUE: Multidetector CT imaging of the head, cervical spine, and maxillofacial structures were performed using the standard protocol without intravenous contrast. Multiplanar CT image reconstructions of the cervical spine and maxillofacial structures were also generated.  COMPARISON:  None.  FINDINGS: CT HEAD FINDINGS  Bony calvarium is intact. A large soft tissue hematoma is noted about the right orbit and right cheek consistent with the patient's recent injury. Mild atrophic changes are noted. No findings to suggest acute hemorrhage, acute infarction or space-occupying mass lesion are noted.  CT MAXILLOFACIAL FINDINGS  Significant motion artifact is identified limiting the exam. No definitive fracture is seen. Significant soft tissue swelling is again noted in the region of the right cheek and right orbit. A focal 2.5 cm hematoma with a fluid fluid level is noted just lateral to the right orbit. The orbits and their contents are within normal limits. Dental caries are noted. No other focal abnormality is seen.  CT CERVICAL SPINE FINDINGS  Seven cervical segments are well visualized. Multilevel disc space narrowing is noted with mild associated osteophytic changes. Facet hypertrophic changes are seen. No acute fracture or acute facet abnormality is noted. The surrounding soft tissues are within normal limits.  IMPRESSION: CT of the head: Atrophic changes without acute intracranial abnormality.  Significant right facial hematoma.  CT of the maxillofacial bones: Considerable right facial soft tissue changes consistent with a recent injury. A focal 2.5 cm hematoma is noted just lateral to the right orbit. No acute fracture is identified.  CT of  the cervical spine: Multilevel degenerative change without acute abnormality.   Electronically Signed   By: Inez Catalina M.D.   On: 04/22/2015 22:53   Dg Hip Unilat With Pelvis 2-3 Views Right  04/23/2015   CLINICAL DATA:  Fall, RIGHT hip pain.  EXAM: RIGHT HIP (WITH PELVIS) 2-3 VIEWS  COMPARISON:  None.  FINDINGS: There is no evidence of hip fracture or dislocation. There is mild RIGHT hip joint space narrowing. No pelvic fracture. Fecal impaction.  IMPRESSION: No acute hip fracture is evident.  Degenerative change.   Electronically Signed   By: Rolla Flatten M.D.   On: 04/23/2015 00:08   Ct Maxillofacial Wo Cm  04/22/2015   CLINICAL DATA:  Unwitnessed fall with large right orbital hematoma, initial encounter  EXAM: CT HEAD WITHOUT CONTRAST  CT MAXILLOFACIAL WITHOUT CONTRAST  CT CERVICAL SPINE WITHOUT CONTRAST  TECHNIQUE: Multidetector CT imaging of the head, cervical spine, and maxillofacial structures were performed using the standard protocol without intravenous contrast. Multiplanar CT image reconstructions of the cervical spine and maxillofacial structures were also generated.  COMPARISON:  None.  FINDINGS: CT HEAD FINDINGS  Bony calvarium is intact. A large soft tissue hematoma is noted about the right orbit and right cheek consistent with the patient's recent injury. Mild atrophic changes are noted. No findings to suggest acute hemorrhage, acute infarction or space-occupying mass lesion are noted.  CT MAXILLOFACIAL FINDINGS  Significant motion artifact is identified limiting the exam. No definitive fracture is seen. Significant soft tissue swelling is again noted in the region of the right cheek and right orbit. A focal 2.5 cm hematoma with a fluid fluid level is noted just lateral to the right orbit. The orbits and their contents are within normal limits. Dental caries are noted. No other focal abnormality is seen.  CT CERVICAL SPINE FINDINGS  Seven cervical segments are well visualized. Multilevel disc  space narrowing is noted with mild associated osteophytic changes. Facet hypertrophic changes are seen. No acute fracture or acute facet abnormality is noted. The surrounding soft tissues are within normal limits.  IMPRESSION: CT of the head: Atrophic changes without acute intracranial abnormality.  Significant right facial hematoma.  CT of the maxillofacial bones: Considerable right facial soft tissue changes consistent with a recent injury. A focal 2.5 cm hematoma is noted just lateral to the right orbit. No acute fracture is identified.  CT of the cervical spine: Multilevel degenerative change without acute abnormality.   Electronically Signed   By: Inez Catalina M.D.   On: 04/22/2015 22:53   2D Echo: Study Conclusions - Left ventricle: Wall motion abnormalities are new since study of 2014. Severe hypokinesis of inferior wall and the septum. The cavity size was normal. Wall thickness was increased in a pattern of mild LVH. The estimated ejection fraction was 35%. - Aortic valve: Sclerosis without stenosis. There was no significant regurgitation. - Left atrium: The atrium was moderately dilated. - Right ventricle: The cavity size was mildly dilated. Systolic function was moderately reduced. - Tricuspid valve: There was mild-moderate regurgitation. - Pulmonary arteries: PA peak pressure: 44 mm Hg (S).  Cardiac catheterization: 3. Dist Cx lesion, 65% stenosed. 4. Prox Cx to Mid Cx lesion, 95% stenosed. 5. RPDA lesion, 95% stenosed. 6. Prox RCA to Dist RCA lesion, 60% stenosed. 7. Ost 1st Diag to 1st Diag lesion, 70% stenosed. 8. 2nd Diag lesion, 90% stenosed. 9. Mid LAD to Dist LAD lesion, 85% stenosed. 10. Ost LM to LM lesion, 50% stenosed.  Impression:  Severe multivessel coronary disease with diffuse significant segmental LAD stenosis, 95% stenosis in a large second diagonal, 75% ostial stenosis in the smaller first diagonal, 95% mid and 65% distal circumflex, 95%  ostial/proximal PDA, and 40-50% left main ostial disease.  Inferior wall hypokinesis with EF of 45%     RECOMMENDATIONS:   TCTS consult to consider coronary bypass grafting once patient is over the recent severe head trauma related to syncope  Long-term monitoring to rule out ischemic arrhythmias either bradycardia or tachycardia.  Dialysis has per nephrology.   Results/Tests Pending at Time of Discharge: none  Discharge Medications:    Medication List    STOP taking these medications        doxycycline 100 MG tablet  Commonly known as:  VIBRA-TABS     sulfamethoxazole-trimethoprim 800-160 MG per tablet  Commonly known as:  BACTRIM DS,SEPTRA DS      TAKE these medications        aspirin 81 MG EC tablet  Take 1 tablet (81 mg total) by mouth daily.     calcium carbonate 1250 (500 CA)  MG tablet  Commonly known as:  OS-CAL - dosed in mg of elemental calcium  TAKE 1 TABLET BY MOUTH THREE TIMES DAILY WITH MEALS     docusate sodium 100 MG capsule  Commonly known as:  DOCQLACE  Take 1 capsule (100 mg total) by mouth daily as needed.     insulin glargine 100 UNIT/ML injection  Commonly known as:  LANTUS  Inject 0.03 mLs (3 Units total) into the skin at bedtime.     metoCLOPramide 5 MG tablet  Commonly known as:  REGLAN  TAKE 1 TABLET BY MOUTH DAILY BEFORE BREAKFAST     multivitamin Tabs tablet  Take 1 tablet by mouth at bedtime.     pantoprazole 40 MG tablet  Commonly known as:  PROTONIX  Take 1 tablet (40 mg total) by mouth daily.     promethazine 25 MG tablet  Commonly known as:  PHENERGAN  Take 1 tablet (25 mg total) by mouth every 6 (six) hours as needed for nausea.     venlafaxine XR 37.5 MG 24 hr capsule  Commonly known as:  EFFEXOR-XR  Take 1 capsule (37.5 mg total) by mouth daily with breakfast. Last dose 05/05/15        Discharge Instructions: Please refer to Patient Instructions section of EMR for full details.  Patient was counseled important  signs and symptoms that should prompt return to medical care, changes in medications, dietary instructions, activity restrictions, and follow up appointments.   Follow-Up Appointments:   Janora Norlander, DO 04/30/2015, 12:16 PM PGY-1, [place]

## 2015-04-25 NOTE — Progress Notes (Signed)
Subjective: Interval History: has complaints not sure what to do about care situation.  Objective: Vital signs in last 24 hours: Temp:  [97.4 F (36.3 C)-98.6 F (37 C)] 97.6 F (36.4 C) (06/09 0553) Pulse Rate:  [54-77] 62 (06/09 0553) Resp:  [15-21] 16 (06/09 0553) BP: (94-179)/(42-98) 148/81 mmHg (06/09 0553) SpO2:  [95 %-99 %] 97 % (06/09 0553) Weight:  [85 kg (187 lb 6.3 oz)-87.2 kg (192 lb 3.9 oz)] 85.9 kg (189 lb 6 oz) (06/09 0553) Weight change: 1.062 kg (2 lb 5.5 oz)  Intake/Output from previous day: 06/08 0701 - 06/09 0700 In: -  Out: 1500 [Emesis/NG output:400] Intake/Output this shift:    General appearance: HOH, bruise esp R side of face Resp: diminished breath sounds bibasilar Chest wall: sore on R Cardio: S1, S2 normal and systolic murmur: holosystolic 2/6, blowing at apex GI: pos bs, liver down 4 cm Extremities: AVF LUA B&T  Lab Results:  Recent Labs  04/23/15 0422 04/25/15 0510  WBC 5.7 5.9  HGB 11.2* 10.9*  HCT 32.9* 31.6*  PLT 125* 93*   BMET:  Recent Labs  04/24/15 0820 04/25/15 0510  NA 132* 136  K 3.7 3.9  CL 94* 98*  CO2 24 26  GLUCOSE 101* 96  BUN 52* 23*  CREATININE 6.07* 3.70*  CALCIUM 9.1 8.4*   No results for input(s): PTH in the last 72 hours. Iron Studies: No results for input(s): IRON, TIBC, TRANSFERRIN, FERRITIN in the last 72 hours.  Studies/Results: No results found.  I have reviewed the patient's current medications.  Assessment/Plan: 1 ESRD HD TTS 2 Anemia stable 3 HPTH stable 4 Falls for cath , care situation is dangerous at hom 5 Brady 6 Dm controlled P HD, cath, counseled, DM controlled     LOS: 1 day   Dealva Lafoy L 04/25/2015,9:01 AM

## 2015-04-25 NOTE — Care Management Note (Signed)
Case Management Note  Patient Details  Name: MARRION FINAN MRN: [id number] Date of Birth: 18-May-1951  Subjective/Objective:           Admitted after fall due to loss of consciousness, has facial swelling and abrasions, hemodialysis patient  MWF, lives with husband who she takes care of         Action/Plan:    Spoke with patient on 04/24/15 about HHC, explained that MD and therapists want her to have home physical and occupational therapy. She stated that she has had home therapy before and didn't think she would benefit from it and did not want the expense or having people come over to her house. Ms Russett agreed to think about it over night and I would follow up with her to discuss home care on 04/25/15. Unable to speak with patient before she left for cath.  CM will follow .    Expected Discharge Date:                  Expected Discharge Plan:  [place]  In-House Referral:     Discharge planning Services  CM Consult  Post Acute Care Choice:  Home Health Choice offered to:  Patient  DME Arranged:    DME Agency:     HH Arranged:    [place] Agency:     Status of Service:  In process, will continue to follow  Medicare Important Message Given:    Date Medicare IM Given:    Medicare IM give by:    Date Additional Medicare IM Given:    Additional Medicare Important Message give by:     If discussed at [place] of Stay Meetings, dates discussed:    Additional Comments:  Nila Nephew, RN 04/25/2015, 12:13 PM

## 2015-04-25 NOTE — H&P (View-Only) (Signed)
Patient ID: [name] [name] MRN: [id number], DOB/AGE: August 10, 1951   Admit date: 04/22/2015   Primary Physician: Tommi Rumps, MD Primary Cardiologist: New  Pt. Profile:  64 y/o female with a h/o ESRD on HD and DM admitted  for syncope evaluation. Subsequent 2D echo shows moderate-severe reduction in LV systolic function. EF 35%.   Problem List  Past Medical History  Diagnosis Date  . Hearing difficulty     Mild   . Diabetes mellitus without complication   . Night muscle spasms   . Bursitis   . Fatigue     Past Surgical History  Procedure Laterality Date  . 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 Esophagogastroduodenoscopy Left 02/20/2013    Procedure: ESOPHAGOGASTRODUODENOSCOPY (EGD);  Surgeon: Cleotis Nipper, MD;  Location: [facility] ENDOSCOPY;  Service: Endoscopy;  Laterality: Left;  . Av fistula placement Left 03/07/2013    Procedure: ARTERIOVENOUS (AV) FISTULA CREATION;  Surgeon: Elam Dutch, MD;  Location: [facility] OR;  Service: Vascular;  Laterality: Left;  left brachiocephalic AVF     Allergies  Allergies  Allergen Reactions  . Aleve [Naproxen Sodium]     Due to kidneys.  . Motrin [Ibuprofen]     Due to kidneys.    HPI  The patient is a 64 y/o female with a h/o ESRD on HD (MWF) as well as a h/o DM. Denies h/o tobacco use. She notes family h/o CAD (brother and sister both had MIs). In 2014, she had a 2D echo that was ordered for "atrial fibrillation" however, I do not see this listed in her PMH and it appears that she has not been followed by a cardiologist. She denies ever being told of any irregular cardiac conditions. Her EF was normal in 2014 at 55-60%. Wall motion was also normal.   She presented to [facility] ER on 04/23/15 after sustaining a syncopal episode. She sustained significant facial trama and a laceration above the lateral aspect of her right eye. Subsequent CT of the head showed no acute abnormalities. Troponin's are minimally  elevated with a flat trend (0.05-->0.06-->0.05), in the setting of CKD. A 2D echo was ordered which demonstrated reduction in EF down to 35%, also with new WMAs not seen on prior echo in 2014. Current findings suggest severe hypokinesis of the inferior wall and the septum. Cardiology has been consulted for further recommendations.   The patient reports that her event happen suddenly and w/o warning. She denies any past history of syncope/ near syncope. She states that she has felt more tired recently, but denies any recent chest pain or palpitations. She had just eaten at a restaurant and as she was walking out to the car, she suddenly collapsed w/o warning. When she awoke, she was in the ED. She denies recent CP, palpitations, lightheadedness/ dizziness. She does admit to exertional dyspnea with day to day activities.   Since admission, she denies any further episodes of syncope/ near syncope. Telemetry shows mild resting bradycardia with rate in the mid-upper 50s. No arrhthymias captured.    Home Medications  Prior to Admission medications   Medication Sig Start Date End Date Taking? Authorizing Provider  calcium carbonate (OS-CAL - DOSED IN MG OF ELEMENTAL CALCIUM) 1250 MG tablet TAKE 1 TABLET BY MOUTH THREE TIMES DAILY WITH MEALS 07/15/13  Yes Amber Fidel Levy, MD  docusate sodium (DOCQLACE) 100 MG capsule Take 1 capsule (100 mg total) by mouth daily as needed. 02/05/15  Yes Randall Hiss  Carolan Shiver, MD  doxycycline (VIBRA-TABS) 100 MG tablet Take 100 mg by mouth 2 (two) times daily. For 7 days   Yes Historical Provider, MD  insulin glargine (LANTUS) 100 UNIT/ML injection Inject 0.03 mLs (3 Units total) into the skin at bedtime. 07/12/14  Yes Leone Haven, MD  multivitamin (RENA-VIT) TABS tablet Take 1 tablet by mouth at bedtime. 09/04/14  Yes Leone Haven, MD  pantoprazole (PROTONIX) 40 MG tablet Take 1 tablet (40 mg total) by mouth daily. 03/06/15  Yes Leeanne Rio, MD  promethazine  (PHENERGAN) 25 MG tablet Take 1 tablet (25 mg total) by mouth every 6 (six) hours as needed for nausea. 03/06/15  Yes Leeanne Rio, MD  sulfamethoxazole-trimethoprim (BACTRIM DS,SEPTRA DS) 800-160 MG per tablet Take 1 tablet by mouth 2 (two) times daily. For 7 days   Yes Historical Provider, MD  venlafaxine XR (EFFEXOR-XR) 75 MG 24 hr capsule TAKE ONE CAPSULE BY MOUTH DAILY WITH BREAKFAST 03/25/15  Yes Leone Haven, MD  aspirin 81 MG EC tablet Take 1 tablet (81 mg total) by mouth daily. Patient not taking: Reported on 04/22/2015 03/08/13   Kandis Nab, MD  metoCLOPramide (REGLAN) 5 MG tablet TAKE 1 TABLET BY MOUTH DAILY BEFORE BREAKFAST Patient not taking: Reported on 04/22/2015 02/20/14   Montez Morita, MD    Family History  Family History  Problem Relation Age of Onset  . Heart disease    . Diabetes    . Stroke    . Bone cancer Father     Of jaws   . Throat cancer Father     Social History  History   Social History  . Marital Status: Married    Spouse Name: N/A  . Number of Children: N/A  . Years of Education: N/A   Occupational History  . Not on file.   Social History Main Topics  . Smoking status: Never Smoker   . Smokeless tobacco: Never Used  . Alcohol Use: No  . Drug Use: No  . Sexual Activity: Not on file   Other Topics Concern  . Not on file   Social History Narrative   Lives with husband Noell Lorensen (born [birth year]).    Denies alcohol, tobacco (never smoked), other drugs   Hobbies: reading           Review of Systems General:  No chills, fever, night sweats or weight changes.  Cardiovascular:  No chest pain, dyspnea on exertion, edema, orthopnea, palpitations, paroxysmal nocturnal dyspnea. Dermatological: No rash, lesions/masses Respiratory: No cough, dyspnea Urologic: No hematuria, dysuria Abdominal:   No nausea, vomiting, diarrhea, bright red blood per rectum, melena, or hematemesis Neurologic:  No visual changes, wkns, changes in mental  status. All other systems reviewed and are otherwise negative except as noted above.  Physical Exam  Blood pressure 145/75, pulse 59, temperature 97.5 F (36.4 C), temperature source Oral, resp. rate 16, weight 189 lb 14.4 oz (86.138 kg), SpO2 97 %.  General: Pleasant, NAD, facial trauma (laceration above lateal aspect of right eye, bilateral periorbital ecchymosis; ecchymosis along lateral aspect of right side of her face.  Psych: Normal affect. Neuro: Alert and oriented X 3. Moves all extremities spontaneously. HEENT: Normal  Neck: Supple without bruits or JVD. Lungs:  Course rales at left base otherwise clear  Heart: RRR no s3, s4, or murmurs. Abdomen: Soft, non-tender, non-distended, BS + x 4.  Extremities: No clubbing, cyanosis or edema. DP/PT/Radials 2+ and equal bilaterally.  Labs  Troponin (Point of Care Test) No results for input(s): TROPIPOC in the last 72 hours.  Recent Labs  04/23/15 1020 04/23/15 1500 04/23/15 1939 04/24/15 0222  TROPONINI 0.06* 0.05* 0.06* 0.05*   Lab Results  Component Value Date   WBC 5.7 04/23/2015   HGB 11.2* 04/23/2015   HCT 32.9* 04/23/2015   MCV 100.6* 04/23/2015   PLT 125* 04/23/2015    Recent Labs Lab 04/24/15 0820  NA 132*  K 3.7  CL 94*  CO2 24  BUN 52*  CREATININE 6.07*  CALCIUM 9.1  GLUCOSE 101*   Lab Results  Component Value Date   CHOL 191 06/12/2014   HDL 51 06/12/2014   LDLCALC 117* 06/12/2014   TRIG 115 06/12/2014   No results found for: DDIMER   Radiology/Studies  Dg Chest 2 View  04/23/2015   CLINICAL DATA:  Fall with right hip pain.  Shortness of breath.  EXAM: CHEST  2 VIEW  COMPARISON:  02/21/2013  FINDINGS: The heart is enlarged. Diffuse reticular opacities suggesting pulmonary edema. No confluent airspace disease. No pleural effusion. No pneumothorax. No acute osseous abnormalities are seen. Hyperdensity in the left upper abdomen is likely related to ingested material.  IMPRESSION: Cardiomegaly and  pulmonary edema.  Correlation for CHF recommended.   Electronically Signed   By: Jeb Levering M.D.   On: 04/23/2015 00:05   Ct Head Wo Contrast  04/22/2015   CLINICAL DATA:  Unwitnessed fall with large right orbital hematoma, initial encounter  EXAM: CT HEAD WITHOUT CONTRAST  CT MAXILLOFACIAL WITHOUT CONTRAST  CT CERVICAL SPINE WITHOUT CONTRAST  TECHNIQUE: Multidetector CT imaging of the head, cervical spine, and maxillofacial structures were performed using the standard protocol without intravenous contrast. Multiplanar CT image reconstructions of the cervical spine and maxillofacial structures were also generated.  COMPARISON:  None.  FINDINGS: CT HEAD FINDINGS  Bony calvarium is intact. A large soft tissue hematoma is noted about the right orbit and right cheek consistent with the patient's recent injury. Mild atrophic changes are noted. No findings to suggest acute hemorrhage, acute infarction or space-occupying mass lesion are noted.  CT MAXILLOFACIAL FINDINGS  Significant motion artifact is identified limiting the exam. No definitive fracture is seen. Significant soft tissue swelling is again noted in the region of the right cheek and right orbit. A focal 2.5 cm hematoma with a fluid fluid level is noted just lateral to the right orbit. The orbits and their contents are within normal limits. Dental caries are noted. No other focal abnormality is seen.  CT CERVICAL SPINE FINDINGS  Seven cervical segments are well visualized. Multilevel disc space narrowing is noted with mild associated osteophytic changes. Facet hypertrophic changes are seen. No acute fracture or acute facet abnormality is noted. The surrounding soft tissues are within normal limits.  IMPRESSION: CT of the head: Atrophic changes without acute intracranial abnormality.  Significant right facial hematoma.  CT of the maxillofacial bones: Considerable right facial soft tissue changes consistent with a recent injury. A focal 2.5 cm hematoma is  noted just lateral to the right orbit. No acute fracture is identified.  CT of the cervical spine: Multilevel degenerative change without acute abnormality.   Electronically Signed   By: Inez Catalina M.D.   On: 04/22/2015 22:53   Ct Cervical Spine Wo Contrast  04/22/2015   CLINICAL DATA:  Unwitnessed fall with large right orbital hematoma, initial encounter  EXAM: CT HEAD WITHOUT CONTRAST  CT MAXILLOFACIAL WITHOUT CONTRAST  CT CERVICAL SPINE WITHOUT CONTRAST  TECHNIQUE: Multidetector CT imaging of the head, cervical spine, and maxillofacial structures were performed using the standard protocol without intravenous contrast. Multiplanar CT image reconstructions of the cervical spine and maxillofacial structures were also generated.  COMPARISON:  None.  FINDINGS: CT HEAD FINDINGS  Bony calvarium is intact. A large soft tissue hematoma is noted about the right orbit and right cheek consistent with the patient's recent injury. Mild atrophic changes are noted. No findings to suggest acute hemorrhage, acute infarction or space-occupying mass lesion are noted.  CT MAXILLOFACIAL FINDINGS  Significant motion artifact is identified limiting the exam. No definitive fracture is seen. Significant soft tissue swelling is again noted in the region of the right cheek and right orbit. A focal 2.5 cm hematoma with a fluid fluid level is noted just lateral to the right orbit. The orbits and their contents are within normal limits. Dental caries are noted. No other focal abnormality is seen.  CT CERVICAL SPINE FINDINGS  Seven cervical segments are well visualized. Multilevel disc space narrowing is noted with mild associated osteophytic changes. Facet hypertrophic changes are seen. No acute fracture or acute facet abnormality is noted. The surrounding soft tissues are within normal limits.  IMPRESSION: CT of the head: Atrophic changes without acute intracranial abnormality.  Significant right facial hematoma.  CT of the maxillofacial  bones: Considerable right facial soft tissue changes consistent with a recent injury. A focal 2.5 cm hematoma is noted just lateral to the right orbit. No acute fracture is identified.  CT of the cervical spine: Multilevel degenerative change without acute abnormality.   Electronically Signed   By: Inez Catalina M.D.   On: 04/22/2015 22:53   Dg Hip Unilat With Pelvis 2-3 Views Right  04/23/2015   CLINICAL DATA:  Fall, RIGHT hip pain.  EXAM: RIGHT HIP (WITH PELVIS) 2-3 VIEWS  COMPARISON:  None.  FINDINGS: There is no evidence of hip fracture or dislocation. There is mild RIGHT hip joint space narrowing. No pelvic fracture. Fecal impaction.  IMPRESSION: No acute hip fracture is evident.  Degenerative change.   Electronically Signed   By: Rolla Flatten M.D.   On: 04/23/2015 00:08   Ct Maxillofacial Wo Cm  04/22/2015   CLINICAL DATA:  Unwitnessed fall with large right orbital hematoma, initial encounter  EXAM: CT HEAD WITHOUT CONTRAST  CT MAXILLOFACIAL WITHOUT CONTRAST  CT CERVICAL SPINE WITHOUT CONTRAST  TECHNIQUE: Multidetector CT imaging of the head, cervical spine, and maxillofacial structures were performed using the standard protocol without intravenous contrast. Multiplanar CT image reconstructions of the cervical spine and maxillofacial structures were also generated.  COMPARISON:  None.  FINDINGS: CT HEAD FINDINGS  Bony calvarium is intact. A large soft tissue hematoma is noted about the right orbit and right cheek consistent with the patient's recent injury. Mild atrophic changes are noted. No findings to suggest acute hemorrhage, acute infarction or space-occupying mass lesion are noted.  CT MAXILLOFACIAL FINDINGS  Significant motion artifact is identified limiting the exam. No definitive fracture is seen. Significant soft tissue swelling is again noted in the region of the right cheek and right orbit. A focal 2.5 cm hematoma with a fluid fluid level is noted just lateral to the right orbit. The orbits and  their contents are within normal limits. Dental caries are noted. No other focal abnormality is seen.  CT CERVICAL SPINE FINDINGS  Seven cervical segments are well visualized. Multilevel disc space narrowing is noted with mild associated osteophytic changes. Facet hypertrophic changes are seen. No  acute fracture or acute facet abnormality is noted. The surrounding soft tissues are within normal limits.  IMPRESSION: CT of the head: Atrophic changes without acute intracranial abnormality.  Significant right facial hematoma.  CT of the maxillofacial bones: Considerable right facial soft tissue changes consistent with a recent injury. A focal 2.5 cm hematoma is noted just lateral to the right orbit. No acute fracture is identified.  CT of the cervical spine: Multilevel degenerative change without acute abnormality.   Electronically Signed   By: Inez Catalina M.D.   On: 04/22/2015 22:53    ECG SR 63 bpm  Echocardiogram   Study Conclusions  - Left ventricle: Wall motion abnormalities are new since study of 2014. Severe hypokinesis of inferior wall and the septum. The cavity size was normal. Wall thickness was increased in a pattern of mild LVH. The estimated ejection fraction was 35%. - Aortic valve: Sclerosis without stenosis. There was no significant regurgitation. - Left atrium: The atrium was moderately dilated. - Right ventricle: The cavity size was mildly dilated. Systolic function was moderately reduced. - Tricuspid valve: There was mild-moderate regurgitation. - Pulmonary arteries: PA peak pressure: 44 mm Hg (S).    ASSESSMENT AND PLAN  1. LV Systolic Dysfunction: new finding since 2014. EF now 35% (55-60% in 2014). Also with new WMA with hypokinesis of the inferior wall and septum. New, unexplained reduction in LV systolic function and new WMA in the setting of syncope warrants ischemic evaluation to r/o CAD. Recommend LHC to further evaluate. ACE/ARB therapy is limited due to  CKD. BB therapy also limited by borderline bradycardia.  Consider use of hydralazine + nitrate, however this may also be limited by hypotension during HD.   2. Syncope: in the setting of moderate-severe LV systolic function, this may be subsequent to underlying CAD or ventricular arrhthymias. No arrhythmias capture thus far on telemetry. Recommend LHC this admission. Continue to monitor on telemetry for arrhythmias. May need to consider 30 day monitor. No driving for at least 6 months.    SignedLyda Jester, PA-C 04/24/2015, 3:42 PM

## 2015-04-25 NOTE — Progress Notes (Signed)
Occupational Therapy Treatment Patient Details Name: [name] [name] MRN: [id number] DOB: March 11, 1951 Today's Date: 04/25/2015    History of present illness Pt is a 64 y.o. Female admitted 04/22/15 with syncopal episode resulting in fall with facial injuries and eye lid hematoma. CT head/neck negative for acute fx or cranial hemorrhage. PMH ESRD (HD on MWF, s/p R- nephrectomy), DM2, OA, memory loss, GERD, depression. Pt with 2 recent falls in pst 6 months.  Pt s/p recent Left Heart Cath and Coronary Angiography.   OT comments  Patient progressing slowly towards goals, continue plan of care for now. However, d/c recommendation updated > SNF. Pt unsure about ST SNF secondary to being worried about her husband, "I will just worry about him, there is no one there to take care of him". Therapist explained to patient that she needed assistance with self-care & functional mobility and that she is unable to safely assist her husband at this time. Pt stated she understood, but continued to express concerns regarding her husband being left alone. According to pt, some neighbors may be able to assist at home and pt mentioned hiring help.   Prior to entering room, RN stated that pt just lifted from bed rest orders. Pt arrived to unit after heart cath no later than 1100 and bed rest orders lifted/discharged 4 hours after arriving to the unit.    Follow Up Recommendations  SNF;Supervision/Assistance - 24 hour    Equipment Recommendations  None recommended by OT    Recommendations for Other Services  None at this time  Precautions / Restrictions Precautions Precautions: Fall Restrictions Weight Bearing Restrictions: No    Mobility Bed Mobility Overal bed mobility: Needs Assistance Bed Mobility: Supine to Sit;Sit to Supine     Supine to sit: Supervision Sit to supine: Supervision   General bed mobility comments: Supervision needed for safety. HOB elevated and pt with use of bed rails.    Transfers Overall transfer level: Needs assistance   Transfers: Stand Pivot Transfers   Stand pivot transfers: Min assist       General transfer comment: Stand pivot from EOB>BSC. Min assist needed for balance and safety. Pt with LOB during dynamic standing for peri cleansing.     Balance Overall balance assessment: Needs assistance Sitting-balance support: No upper extremity supported;Feet supported Sitting balance-Leahy Scale: Good     Standing balance support: No upper extremity supported;During functional activity Standing balance-Leahy Scale: Poor   ADL Overall ADL's : Needs assistance/impaired General ADL Comments: Prior to entering room, therapist talked with RN regarding bed rest orders. RN stated that pt arrived to unit at 1100 and pt off bed rest 4 hours post this time. RN made it known that pt is off bed rest orders at 1500. Pt with request to use BR. Pt engaged in bed mobility with no distress, all vitals OK. Pt performed stand pivot transfer EOB>BSC. Pt with loose BM. Pt stood for peri cleansing with LOB that therapist had to assist pt with so she did not fall. Pt transferred back to bed and therapist assisted with re-positioning pt in bed. RN notified of mobility status.      Cognition   Behavior During Therapy: WFL for tasks assessed/performed Overall Cognitive Status: No family/caregiver present to determine baseline cognitive functioning                Pertinent Vitals/ Pain       Pain Assessment: No/denies pain  Home Living Family/patient expects to be discharged to:: Private residence  Living Arrangements: Spouse/significant other        Frequency Min 2X/week     Progress Toward Goals  OT Goals(current goals can now be found in the care plan section)  Progress towards OT goals: Progressing toward goals     Plan Discharge plan needs to be updated    Activity Tolerance Patient tolerated treatment well  Patient Left in bed;with call bell/phone  within reach  Nurse Communication Mobility status;Other (comment) (RN confirmed that pt off bed rest as of 1500)     Time: 2585-2778 OT Time Calculation (min): 20 min  Charges: OT General Charges $OT Visit: 1 Procedure OT Treatments $Self Care/Home Management : 8-22 mins  Rosalie Gelpi , MS, OTR/L, CLT Pager: [phone]  04/25/2015, 3:37 PM

## 2015-04-25 NOTE — Progress Notes (Signed)
Patient transfered to room 6C05 from cath lab. Report given to receiving nurse Clair Gulling, RN. Belongings (including clothing and personal cane) sent to new room.    Loni Muse, RN

## 2015-04-25 NOTE — Consult Note (Signed)
**Note [name]-Identified via Obfuscation** [name] [name]       [place] [address]             [phone]        [name] [place] Medical Record #[id number] Date of Birth: [birth year]  Referring: No ref. provider found Primary Care: Tommi Rumps, MD  Chief Complaint:    Chief Complaint  Patient presents with  . Near Syncope  . Fall    History of Present Illness:     Patient examined, coronary angiogram and echocardiogram images personally reviewed.  Patient brought to the ED after 61 episode at a restaurant parking lot. She sustained severe soft tissue right facial, head and neck trauma with severe bruising and swelling and ecchymoses. Fortunately the head CT scan did not show a fracture. She does have a loose tooth she states. Since cardiac enzymes were mildly positive she underwent left heart catheterization. This showed severe multivessel coronary artery disease. Echocardiogram shows anterior wall hypokinesia with EF of 35%. There is no significant mitral regurgitation. There is no evidence of aortic stenosis. Carotid Dopplers are pending. She states she has had 2 or 3 other falls usually related to trips as she is very unsteady on her feet and uses a walker or cane. The patient is very frail, poorly mobile, and has severe comorbid medical disease including end-stage renal failure on hemodialysis, diabetes, dementia, and lower extremity severe osteoarthritis. She denies chest pain. She has had no cardiac arrhythmia. She denies symptoms of CHF.  Current Activity/ Functional Status: Limited mobility, severe arthritis of the lower extremities, dementia,   Zubrod Score: At the time of surgery this patient's most appropriate activity status/level should be described as: []     0    Normal activity, no symptoms []     1    Restricted in physical strenuous activity but ambulatory, able to do out light work []     2    Ambulatory and capable of self care, unable to do work activities, up and about                  more than 50%  Of the time                            [x]     3    Only limited self care, in bed greater than 50% of waking hours []     4    Completely disabled, no self care, confined to bed or chair []     5    Moribund  Past Medical History  Diagnosis Date  . Hearing difficulty     Mild   . Diabetes mellitus without complication   . Night muscle spasms   . Bursitis   . Fatigue   . Coronary artery disease   . Chronic kidney disease     dialysis  m w f     Past Surgical History  Procedure Laterality Date  . 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 Esophagogastroduodenoscopy Left 02/20/2013    Procedure: ESOPHAGOGASTRODUODENOSCOPY (EGD);  Surgeon: Cleotis Nipper, MD;  Location: [facility] ENDOSCOPY;  Service: Endoscopy;  Laterality: Left;  . Av fistula placement Left 03/07/2013    Procedure: ARTERIOVENOUS (AV) FISTULA CREATION;  Surgeon: Elam Dutch, MD;  Location: [facility] OR;  Service: Vascular;  Laterality: Left;  left brachiocephalic AVF  . Cardiac catheterization N/A 04/25/2015  Procedure: Left Heart Cath and Coronary Angiography;  Surgeon: Belva Crome, MD;  Location: [facility] LAB;  Service: Cardiovascular;  Laterality: N/A;    History  Smoking status  . Never Smoker   Smokeless tobacco  . Never Used    History  Alcohol Use No    History   Social History  . Marital Status: Married    Spouse Name: N/A  . Number of Children: N/A  . Years of Education: N/A   Occupational History  . Not on file.   Social History Main Topics  . Smoking status: Never Smoker   . Smokeless tobacco: Never Used  . Alcohol Use: No  . Drug Use: No  . Sexual Activity: Not on file   Other Topics Concern  . Not on file   Social History Narrative   Lives with husband [name] [name] (born [birth year]).    Denies alcohol, tobacco (never smoked), other drugs   Hobbies: reading          Allergies  Allergen Reactions  . Aleve [Naproxen Sodium]     Due  to kidneys.  . Motrin [Ibuprofen]     Due to kidneys.    Current Facility-Administered Medications  Medication Dose Route Frequency Provider Last Rate Last Dose  . 0.9 %  sodium chloride infusion  100 mL Intravenous PRN Mauricia Area, MD      . acetaminophen (TYLENOL) tablet 650 mg  650 mg Oral Q6H PRN Olin Hauser, DO   650 mg at 04/25/15 7846   Or  . acetaminophen (TYLENOL) suppository 650 mg  650 mg Rectal Q6H PRN Olin Hauser, DO      . alteplase (CATHFLO ACTIVASE) injection 2 mg  2 mg Intracatheter Once PRN Mauricia Area, MD      . calcium carbonate (OS-CAL - dosed in mg of elemental calcium) tablet 500 mg of elemental calcium  1 tablet Oral TID WC Olin Hauser, DO   500 mg of elemental calcium at 04/25/15 1419  . docusate sodium (COLACE) capsule 100 mg  100 mg Oral Daily PRN Olin Hauser, DO      . feeding supplement (NEPRO CARB STEADY) liquid 237 mL  237 mL Oral PRN Mauricia Area, MD      . heparin injection 1,000 Units  1,000 Units Dialysis PRN Mauricia Area, MD      . heparin injection 5,000 Units  5,000 Units Subcutaneous 3 times per day Olin Hauser, DO   5,000 Units at 04/24/15 2359  . insulin aspart (novoLOG) injection 0-9 Units  0-9 Units Subcutaneous TID WC Olin Hauser, DO   1 Units at 04/24/15 1200  . lidocaine (PF) (XYLOCAINE) 1 % injection 5 mL  5 mL Intradermal PRN Mauricia Area, MD      . lidocaine-prilocaine (EMLA) cream 1 application  1 application Topical PRN Mauricia Area, MD      . multivitamin (RENA-VIT) tablet 1 tablet  1 tablet Oral QHS Olin Hauser, DO   1 tablet at 04/24/15 2359  . ondansetron (ZOFRAN) injection 4 mg  4 mg Intravenous Q6H PRN Belva Crome, MD      . oxyCODONE-acetaminophen (PERCOCET/ROXICET) 5-325 MG per tablet 1-2 tablet  1-2 tablet Oral Q4H PRN Belva Crome, MD      . pantoprazole (PROTONIX) EC tablet 40 mg  40 mg Oral Daily Olin Hauser, DO    40 mg at 04/25/15 1715  . pentafluoroprop-tetrafluoroeth (GEBAUERS) aerosol 1 application  1 application Topical PRN Mauricia Area, MD      . promethazine (PHENERGAN) tablet 12.5 mg  12.5 mg Oral Q6H PRN Olin Hauser, DO   12.5 mg at 04/24/15 0847  . sodium chloride 0.9 % injection 3 mL  3 mL Intravenous Q12H Olin Hauser, DO   3 mL at 04/24/15 1000  . venlafaxine XR (EFFEXOR-XR) 24 hr capsule 75 mg  75 mg Oral Q breakfast Olin Hauser, DO   75 mg at 04/25/15 1243    Prescriptions prior to admission  Medication Sig Dispense Refill Last Dose  . calcium carbonate (OS-CAL - DOSED IN MG OF ELEMENTAL CALCIUM) 1250 MG tablet TAKE 1 TABLET BY MOUTH THREE TIMES DAILY WITH MEALS 100 tablet 0 04/22/2015 at Unknown time  . docusate sodium (DOCQLACE) 100 MG capsule Take 1 capsule (100 mg total) by mouth daily as needed. 30 capsule 3 unkn  . doxycycline (VIBRA-TABS) 100 MG tablet Take 100 mg by mouth 2 (two) times daily. For 7 days   04/22/2015 at Unknown time  . insulin glargine (LANTUS) 100 UNIT/ML injection Inject 0.03 mLs (3 Units total) into the skin at bedtime. 10 mL 6 04/21/2015 at Unknown time  . multivitamin (RENA-VIT) TABS tablet Take 1 tablet by mouth at bedtime. 90 tablet 3 04/22/2015 at Unknown time  . pantoprazole (PROTONIX) 40 MG tablet Take 1 tablet (40 mg total) by mouth daily. 30 tablet 1 04/22/2015 at Unknown time  . promethazine (PHENERGAN) 25 MG tablet Take 1 tablet (25 mg total) by mouth every 6 (six) hours as needed for nausea. 30 tablet 1 unkn  . sulfamethoxazole-trimethoprim (BACTRIM DS,SEPTRA DS) 800-160 MG per tablet Take 1 tablet by mouth 2 (two) times daily. For 7 days   04/22/2015 at Unknown time  . venlafaxine XR (EFFEXOR-XR) 75 MG 24 hr capsule TAKE ONE CAPSULE BY MOUTH DAILY WITH BREAKFAST 30 capsule 1 04/22/2015 at Unknown time  . aspirin 81 MG EC tablet Take 1 tablet (81 mg total) by mouth daily. (Patient not taking: Reported on 04/22/2015) 30 tablet 0 Not  Taking at Unknown time  . metoCLOPramide (REGLAN) 5 MG tablet TAKE 1 TABLET BY MOUTH DAILY BEFORE BREAKFAST (Patient not taking: Reported on 04/22/2015) 30 tablet 11 Not Taking at Unknown time    Family History  Problem Relation Age of Onset  . Heart disease    . Diabetes    . Stroke    . Bone cancer Father     Of jaws   . Throat cancer Father      Review of Systems:       Cardiac Review of Systems: Y or N  Chest Pain [  no  ]  Resting SOB [ no  ] Exertional SOB  [ no ]  Orthopnea [ no ]   Pedal Edema [no   ]    Palpitations [ no ] Syncope  [ yes ]   Presyncope [  yes ]  General Review of Systems: [Y] = yes [  ]=no Constitional: recent weight change [  ]; anorexia [  ]; fatigue [  ]; nausea [  ]; night sweats [  ]; fever [  ]; or chills [  ]  Dental: poor dentition[  ]; Last Dentist visit: Greater than 6 months  Eye : blurred vision [ yes ]; diplopia [   ]; vision changes [  ];  Amaurosis fugax[  ]; Resp: cough [  ];  wheezing[  ];  hemoptysis[  ]; shortness of breath[  ]; paroxysmal nocturnal dyspnea[  ]; dyspnea on exertion[  yes]; or orthopnea[  ];  GI:  gallstones[  ], vomiting[  ];  dysphagia[  ]; melena[  ];  hematochezia [  ]; heartburn[  ];   Hx of  Colonoscopy[  ]; GU: kidney stones [  ]; hematuria[  ];   dysuria [  ];  nocturia[  ];  history of     obstruction [  ]; urinary frequency [  ] status post right nephrectomy for recurrent infection             Skin: rash, swelling[ yes over her face from trauma ];, hair loss[  ];  peripheral edema[  ];  or itching[  ]; Musculosketetal: myalgias[  ];  joint swelling[  ];  joint erythema[  ];  joint pain[  ];  back pain[  ];  Heme/Lymph: bruising[  yes];  bleeding[ yes-related to trauma ];  anemia[  ];  Neuro: TIA[  ];  headaches[yes-chronic  ];  stroke[  ];  vertigo[  ];  seizures[  ];   paresthesias[  ];  difficulty walking[yes severe arthritis  ];  Psych:depression[  ];  anxiety[  ];  Endocrine: diabetes[ yes ];  thyroid dysfunction[  ];  Immunizations: Flu [  ]; Pneumococcal[  ];  Other: Right hand dominant  Physical Exam: BP 105/67 mmHg  Pulse 60  Temp(Src) 97.7 F (36.5 C) (Oral)  Resp 18  Wt 189 lb 6 oz (85.9 kg)  SpO2 97%       Physical Exam  General: Elderly Caucasian female who appears very frail with severe soft tissue injury to face, head and neck with complete swelling of right eye. HEENT: Normocephalic pupils equal , dentition poor swelling and bruising in neck Moses over right face and mandible Neck: Supple without JVD, adenopathy, or bruit, ecchymoses over entire right neck Chest: Clear to auscultation, symmetrical breath sounds, no rhonchi, no tenderness             or deformity Cardiovascular: Regular rate and rhythm, no murmur, no gallop, peripheral pulses             palpable in all extremities Abdomen:  Soft, nontender, no palpable mass or organomegaly Extremities: Warm, well-perfused, no clubbing cyanosis edema , positive for severe tenderness around each knee,              Positive venous stasis changes of the legs, right greater than left. Large AV fistula left upper arm Rectal/GU: Deferred Neuro: Grossly non--focal and symmetrical throughout Skin: Clean and dry without rash or ulceration   Diagnostic Studies & Laboratory data:     Recent Radiology Findings:   Most recent chest x-ray images personally reviewed Coronary angiogram images personally reviewed  I have independently reviewed the above radiologic studies.  Recent Lab Findings: Lab Results  Component Value Date   WBC 4.5 04/25/2015   HGB 11.4* 04/25/2015   HCT 34.2* 04/25/2015   PLT 105* 04/25/2015   GLUCOSE 91 04/25/2015   CHOL 191 06/12/2014   TRIG 115 06/12/2014   HDL 51 06/12/2014   LDLCALC 117* 06/12/2014   ALT <5 03/07/2013   AST 13 03/07/2013  NA 133* 04/25/2015   K 3.9 04/25/2015   CL 95* 04/25/2015   CREATININE 4.02* 04/25/2015   BUN  27* 04/25/2015   CO2 26 04/25/2015   TSH 1.974 04/25/2015   INR 1.14 04/25/2015   HGBA1C 6.1 06/12/2014      Assessment / Plan:     Recent diagnosis of severe multivessel coronary disease. Her LV function has decreased over the past 2 years on serial echocardiograms now at 35%.  It is unclear whether the syncopal episode is related to her coronary disease.  The patient would be a poor candidate for CABG because of her renal failure, fragile condition, poor mobility, venous stasis of the lower extremities with probable poor conduit, and currently very high risk of high-dose anticoagulation worsening her soft tissue injuries to the head and neck and face. I would recommend medical therapy currently, allowing her to recover from the trauma to the head and neck area and then reevaluation for PCI which would allow time for recovery before she would be placed on Plavix. I will also be willing to reevaluate her for CABG as an outpatient in the future when she is recovered from her acute injuries.    @ME1 @ 04/25/2015 7:04 PM

## 2015-04-26 ENCOUNTER — Encounter (HOSPITAL_COMMUNITY): Payer: Self-pay | Admitting: Nurse Practitioner

## 2015-04-26 DIAGNOSIS — I255 Ischemic cardiomyopathy: Secondary | ICD-10-CM

## 2015-04-26 DIAGNOSIS — I459 Conduction disorder, unspecified: Secondary | ICD-10-CM

## 2015-04-26 LAB — CBC
HCT: 32.8 % — ABNORMAL LOW (ref 36.0–46.0)
HEMATOCRIT: 30.6 % — AB (ref 36.0–46.0)
HEMOGLOBIN: 10.3 g/dL — AB (ref 12.0–15.0)
Hemoglobin: 11 g/dL — ABNORMAL LOW (ref 12.0–15.0)
MCH: 33.7 pg (ref 26.0–34.0)
MCH: 33.9 pg (ref 26.0–34.0)
MCHC: 33.5 g/dL (ref 30.0–36.0)
MCHC: 33.7 g/dL (ref 30.0–36.0)
MCV: 100.6 fL — ABNORMAL HIGH (ref 78.0–100.0)
MCV: 100.7 fL — AB (ref 78.0–100.0)
PLATELETS: 116 10*3/uL — AB (ref 150–400)
Platelets: 115 10*3/uL — ABNORMAL LOW (ref 150–400)
RBC: 3.26 MIL/uL — ABNORMAL LOW (ref 3.87–5.11)
RBC: 3.04 MIL/uL — ABNORMAL LOW (ref 3.87–5.11)
RDW: 14.2 % (ref 11.5–15.5)
RDW: 14.4 % (ref 11.5–15.5)
WBC: 6.7 10*3/uL (ref 4.0–10.5)
WBC: 5.7 10*3/uL (ref 4.0–10.5)

## 2015-04-26 LAB — HEPATIC FUNCTION PANEL
ALT: 19 U/L (ref 14–54)
AST: 28 U/L (ref 15–41)
Albumin: 3.2 g/dL — ABNORMAL LOW (ref 3.5–5.0)
Alkaline Phosphatase: 112 U/L (ref 38–126)
Bilirubin, Direct: 0.4 mg/dL (ref 0.1–0.5)
Indirect Bilirubin: 0.8 mg/dL (ref 0.3–0.9)
Total Bilirubin: 1.2 mg/dL (ref 0.3–1.2)
Total Protein: 6.1 g/dL — ABNORMAL LOW (ref 6.5–8.1)

## 2015-04-26 LAB — RENAL FUNCTION PANEL
Albumin: 3.1 g/dL — ABNORMAL LOW (ref 3.5–5.0)
Anion gap: 13 (ref 5–15)
BUN: 39 mg/dL — ABNORMAL HIGH (ref 6–20)
CO2: 23 mmol/L (ref 22–32)
Calcium: 8.6 mg/dL — ABNORMAL LOW (ref 8.9–10.3)
Chloride: 94 mmol/L — ABNORMAL LOW (ref 101–111)
Creatinine, Ser: 5.83 mg/dL — ABNORMAL HIGH (ref 0.44–1.00)
GFR calc Af Amer: 8 mL/min — ABNORMAL LOW (ref >60)
GFR calc non Af Amer: 7 mL/min — ABNORMAL LOW (ref >60)
GLUCOSE: 146 mg/dL — AB (ref 65–99)
PHOSPHORUS: 4.2 mg/dL (ref 2.5–4.6)
Potassium: 3.7 mmol/L (ref 3.5–5.1)
SODIUM: 130 mmol/L — AB (ref 135–145)

## 2015-04-26 LAB — BASIC METABOLIC PANEL
Anion gap: 12 (ref 5–15)
BUN: 36 mg/dL — ABNORMAL HIGH (ref 6–20)
CALCIUM: 9 mg/dL (ref 8.9–10.3)
CO2: 25 mmol/L (ref 22–32)
Chloride: 97 mmol/L — ABNORMAL LOW (ref 101–111)
Creatinine, Ser: 5.49 mg/dL — ABNORMAL HIGH (ref 0.44–1.00)
GFR calc Af Amer: 9 mL/min — ABNORMAL LOW (ref >60)
GFR calc non Af Amer: 7 mL/min — ABNORMAL LOW (ref >60)
Glucose, Bld: 121 mg/dL — ABNORMAL HIGH (ref 65–99)
Potassium: 4.1 mmol/L (ref 3.5–5.1)
SODIUM: 134 mmol/L — AB (ref 135–145)

## 2015-04-26 LAB — GLUCOSE, CAPILLARY
Glucose-Capillary: 132 mg/dL — ABNORMAL HIGH (ref 65–99)
Glucose-Capillary: 85 mg/dL (ref 65–99)

## 2015-04-26 MED ORDER — VENLAFAXINE HCL ER 37.5 MG PO CP24
37.5000 mg | ORAL_CAPSULE | Freq: Every day | ORAL | Status: DC
  Administered 2015-04-27 – 2015-04-30 (×4): 37.5 mg via ORAL
  Filled 2015-04-26 (×5): qty 1

## 2015-04-26 MED ORDER — ISOSORBIDE MONONITRATE ER 30 MG PO TB24
15.0000 mg | ORAL_TABLET | Freq: Every day | ORAL | Status: DC
  Administered 2015-04-26 – 2015-04-30 (×5): 15 mg via ORAL
  Filled 2015-04-26 (×5): qty 1

## 2015-04-26 MED ORDER — VENLAFAXINE HCL ER 37.5 MG PO CP24: 37.5000 mg | ORAL_CAPSULE | Freq: Every day | ORAL | Status: DC

## 2015-04-26 MED ORDER — ASPIRIN EC 81 MG PO TBEC
81.0000 mg | DELAYED_RELEASE_TABLET | Freq: Every day | ORAL | Status: DC
  Administered 2015-04-26 – 2015-04-30 (×5): 81 mg via ORAL
  Filled 2015-04-26 (×5): qty 1

## 2015-04-26 MED ORDER — CARVEDILOL 3.125 MG PO TABS
3.1250 mg | ORAL_TABLET | Freq: Two times a day (BID) | ORAL | Status: DC
  Administered 2015-04-26: 3.125 mg via ORAL
  Filled 2015-04-26: qty 1

## 2015-04-26 MED FILL — Lidocaine HCl Local Preservative Free (PF) Inj 1%: INTRAMUSCULAR | Qty: 30 | Status: AC

## 2015-04-26 MED FILL — Heparin Sodium (Porcine) 2 Unit/ML in Sodium Chloride 0.9%: INTRAMUSCULAR | Qty: 1000 | Status: AC

## 2015-04-26 NOTE — Progress Notes (Signed)
Subjective: Interval History: has no complaint.  Objective: Vital signs in last 24 hours: Temp:  [97 F (36.1 C)-98.7 F (37.1 C)] 97.8 F (36.6 C) (06/10 0955) Pulse Rate:  [0-70] 53 (06/10 1030) Resp:  [0-23] 17 (06/10 1030) BP: (105-195)/(53-94) 117/63 mmHg (06/10 1030) SpO2:  [0 %-100 %] 96 % (06/10 1030) Weight:  [89.1 kg (196 lb 6.9 oz)-91 kg (200 lb 9.9 oz)] 89.1 kg (196 lb 6.9 oz) (06/10 0955) Weight change: 3.8 kg (8 lb 6 oz)  Intake/Output from previous day: 06/09 0701 - 06/10 0700 In: 501 [P.O.:480; I.V.:21] Out: 0  Intake/Output this shift: Total I/O In: 180 [P.O.:180] Out: -   General appearance: cooperative, pale, slowed mentation and massive facial hematoma,  Ox2 Resp: diminished breath sounds bilaterally and rales bibasilar Cardio: S1, S2 normal, systolic murmur: holosystolic 2/6, blowing at apex and Rate 40s to low 50s GI: pos bs,liver down 6 cm Extremities: edema 1-2+ and AVF LUA  Lab Results:  Recent Labs  04/25/15 1230 04/26/15 0305  WBC 4.5 6.7  HGB 11.4* 11.0*  HCT 34.2* 32.8*  PLT 105* 115*   BMET:  Recent Labs  04/25/15 1230 04/26/15 0305  NA 133* 134*  K 3.9 4.1  CL 95* 97*  CO2 26 25  GLUCOSE 91 121*  BUN 27* 36*  CREATININE 4.02* 5.49*  CALCIUM 8.3* 9.0   No results for input(s): PTH in the last 72 hours. Iron Studies: No results for input(s): IRON, TIBC, TRANSFERRIN, FERRITIN in the last 72 hours.  Studies/Results: No results found.  I have reviewed the patient's current medications.  Assessment/Plan: 1 ESRD bp low, bradycardia, ?confusion.  For HD 2 Bradycardia hold off beta blocker 3 Orthostasis stop Venaflexine 4 Anemia stable 5 DM controlled 6 Syncope still not evident etiology 7 CAD with lower EF 8 Home situation this is very UNDESIREABLE 9 Hematoma slowly better 10 lethargy P HD, stop Venaflexine,coreg, cont epo    LOS: 2 days   [name] [name] 04/26/2015,11:00 AM

## 2015-04-26 NOTE — Progress Notes (Signed)
Twin note that pt unsteady with pivot to [facility]. Will let PT evaluate before we try to ambulate. Will follow their progress. Graylon Good RN BSN 04/26/2015 12:19 PM

## 2015-04-26 NOTE — Progress Notes (Addendum)
**  Interval Note**  Discussed patient's care with Dr Jimmy Footman, who voiced concern over new medication regimen in the setting of bradycardia and labile blood pressures (often with post-HD hypotension).  He has discontinued the coreg, patient with HR 40's during HD today.  Discussed this with cardsmaster, who will discuss with Dr Johnsie Cancel today.  Prognosis not favorable at this time, as it appears that patient's blood pressure is not going to tolerate medical management.  Greatly appreciate all help with this patient's care.  In addition, Effexor 75mg  discontinued 2/2 potential side effect of loss of consciousness.  Since patient has been on this for a while, she would need to taper off to avoid complications.  Will decrease dose to 37.5mg  daily x1 week, then off.  Adreyan Carbajal M. Lajuana Ripple, DO PGY-1, [place]

## 2015-04-26 NOTE — Progress Notes (Signed)
Pt transferred to 3W18, pt in no distress, no complaints.  Vital signs stable as charted.

## 2015-04-26 NOTE — Progress Notes (Signed)
Patient ID: [name] [name], female   DOB: [birth year], 64 y.o.   MRN: [id number]    Subjective:  Denies SSCP, palpitations or Dyspnea   Objective:  Filed Vitals:   04/26/15 0008 04/26/15 0309 04/26/15 0600 04/26/15 0759  BP: 149/82 178/87 155/60 155/79  Pulse: 70 66  63  Temp: 98.7 F (37.1 C) 97 F (36.1 C)  97.7 F (36.5 C)  TempSrc: Oral Oral  Oral  Resp: 23 23 18 18   Weight: 91 kg (200 lb 9.9 oz)     SpO2: 96% 98% 97% 98%    Intake/Output from previous day:  Intake/Output Summary (Last 24 hours) at 04/26/15 0804 Last data filed at 04/26/15 0759  Gross per 24 hour  Intake  670.5 ml  Output      0 ml  Net  670.5 ml    Physical Exam: Affect appropriate Chronically ill white female  HEENT: Multiple areas of echymosis and swelling especially right orbit and maxillary area  Neck supple with no adenopathy JVP normal no bruits no thyromegaly Lungs clear with no wheezing and good diaphragmatic motion Heart:  S1/S2 no murmur, no rub, gallop or click PMI normal  Fistula LUE  Abdomen: benighn, BS positve, no tenderness, no AAA no bruit.  No HSM or HJR Distal pulses intact with no bruits Trace bilateral edema Neuro non-focal Skin warm and dry No muscular weakness   Lab Results: Basic Metabolic Panel:  Recent Labs  04/25/15 1230 04/26/15 0305  NA 133* 134*  K 3.9 4.1  CL 95* 97*  CO2 26 25  GLUCOSE 91 121*  BUN 27* 36*  CREATININE 4.02* 5.49*  CALCIUM 8.3* 9.0  PHOS 3.5  --    Liver Function Tests:  Recent Labs  04/25/15 1230 04/26/15 0305  AST  --  28  ALT  --  19  ALKPHOS  --  112  BILITOT  --  1.2  PROT  --  6.1*  ALBUMIN 3.2* 3.2*   CBC:  Recent Labs  04/25/15 1230 04/26/15 0305  WBC 4.5 6.7  HGB 11.4* 11.0*  HCT 34.2* 32.8*  MCV 100.9* 100.6*  PLT 105* 115*   Cardiac Enzymes:  Recent Labs  04/23/15 1500 04/23/15 1939 04/24/15 0222  TROPONINI 0.05* 0.06* 0.05*   Thyroid Function Tests:  Recent Labs  04/25/15 0510  TSH  1.974    Imaging: No results found.  Cardiac Studies:  ECG:  SR rate 63 PR 221 nonspecific ST/T wave changes    Telemetry:  NSR no VT 04/26/2015   Echo:  6/8  EF 35%  No significant valve disease   Medications:   . calcium carbonate  1 tablet Oral TID WC  . heparin  5,000 Units Subcutaneous 3 times per day  . insulin aspart  0-9 Units Subcutaneous TID WC  . multivitamin  1 tablet Oral QHS  . pantoprazole  40 mg Oral Daily  . sodium chloride  3 mL Intravenous Q12H  . venlafaxine XR  75 mg Oral Q breakfast       Assessment/Plan:  Syncope:  Etiology not clear Discussed with Dr Tamala Julian feel loop recorder would be helpful until further revascularization can be done Have contacted EP  [id number] presentation was syncope.  No angina r/o.  Reviewed PVT;s note and turned down for CABG at this time.  Discussed with Dr Tamala Julian who Favors medical Rx with loop recorder and outpatient f/u . Once recovered from soft tissue injuries can be re evaluated by PVT and  if turned down consider Staged intervention to RCA/Circumflex  LAD is problamatic add low dose beta blocker / nitrates   CRF:  For dialysis today  Fistula LUE with good thrill   [name] 04/26/2015, 8:04 AM

## 2015-04-26 NOTE — Consult Note (Signed)
ELECTROPHYSIOLOGY CONSULT NOTE    Patient ID: [name] [name] MRN: [id number], DOB/AGE: [birth year] 64 y.o.  Admit date: 04/22/2015 Date of Consult: 04/26/2015  Primary Physician: [name] Rumps, MD Primary Cardiologist: [name] [name]  Reason for Consultation: syncope  HPI:  [name] [name] is a 64 y.o. female with a past medical history significant for hypertension, diabetes, and ESRD on dialysis. On the day of admission, she had finished eating breakfast at [facility] and [employer].  She was walking outside and had a sudden loss of consciousness and was found prone on the ground by a bystander.  EMS was called and [facility] was brought [facility].  Echocardiogram demonstrated EF 35%, mild LVH, mild to moderate TR, LA 45.  She underwent catheterization which demonstrated severe multivessel CAD; EF 45%.  TCTS was consulted but no CABG at this time with comorbidities and severe soft tissue injuries.  Plan for outpatient re-evaluation with TCTS vs staged interventions.  EP has been asked to evaluate for treatment options regarding syncope.  The patient reports that she has never had a syncopal episode before the day of admission. She does not have palpitations, chest pain, shortness of breath.  No recent fevers, chills, nausea or vomiting.  She is dialyzed through her LUE.  There is no family history of sudden death.   Past Medical History  Diagnosis Date  . Hearing difficulty     Mild   . Diabetes mellitus without complication   . Coronary artery disease     a. cath 6/16 Severe multivessel cad with diffuse significant segmental LAD stenosis, 95% stenosis in a large second diagonal, 75% ostial stenosis in the smaller first diagonal, 95% mid and 65% distal circumflex, 95% ostial/proximal PDA, and 40-50% left main ostial disease  . ESRD (end stage renal disease) on dialysis        . Hypertension   . Ischemic cardiomyopathy   . Syncope      Surgical History:  Past Surgical History    Procedure Laterality Date  . 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 Esophagogastroduodenoscopy Left 02/20/2013    Procedure: ESOPHAGOGASTRODUODENOSCOPY (EGD);  Surgeon: [name] Nipper, MD;  Location[facility] ENDOSCOPY;  Service: Endoscopy;  Laterality: Left;  . Av fistula placement Left 03/07/2013    Procedure: ARTERIOVENOUS (AV) FISTULA CREATION;  Surgeon: [name] Dutch, MD;  Location: [facility] OR;  Service: Vascular;  Laterality: Left;  left brachiocephalic AVF  . Cardiac catheterization N/A 04/25/2015    Procedure: Left Heart Cath and Coronary Angiography;  Surgeon: [name] Crome, MD;  Location: [facility] CV LAB;  Service: Cardiovascular;  Laterality: N/A;     Prescriptions prior to admission  Medication Sig Dispense Refill Last Dose  . calcium carbonate (OS-CAL - DOSED IN MG OF ELEMENTAL CALCIUM) 1250 MG tablet TAKE 1 TABLET BY MOUTH THREE TIMES DAILY WITH MEALS 100 tablet 0 04/22/2015 at Unknown time  . docusate sodium (DOCQLACE) 100 MG capsule Take 1 capsule (100 mg total) by mouth daily as needed. 30 capsule 3 unkn  . doxycycline (VIBRA-TABS) 100 MG tablet Take 100 mg by mouth 2 (two) times daily. For 7 days   04/22/2015 at Unknown time  . insulin glargine (LANTUS) 100 UNIT/ML injection Inject 0.03 mLs (3 Units total) into the skin at bedtime. 10 mL 6 04/21/2015 at Unknown time  . multivitamin (RENA-VIT) TABS tablet Take 1 tablet by mouth at bedtime. 90 tablet 3 04/22/2015 at Unknown time  . pantoprazole (PROTONIX)  40 MG tablet Take 1 tablet (40 mg total) by mouth daily. 30 tablet 1 04/22/2015 at Unknown time  . promethazine (PHENERGAN) 25 MG tablet Take 1 tablet (25 mg total) by mouth every 6 (six) hours as needed for nausea. 30 tablet 1 unkn  . sulfamethoxazole-trimethoprim (BACTRIM DS,SEPTRA DS) 800-160 MG per tablet Take 1 tablet by mouth 2 (two) times daily. For 7 days   04/22/2015 at Unknown time  . venlafaxine XR (EFFEXOR-XR) 75 MG 24 hr capsule TAKE ONE CAPSULE BY MOUTH  DAILY WITH BREAKFAST 30 capsule 1 04/22/2015 at Unknown time  . aspirin 81 MG EC tablet Take 1 tablet (81 mg total) by mouth daily. (Patient not taking: Reported on 04/22/2015) 30 tablet 0 Not Taking at Unknown time  . metoCLOPramide (REGLAN) 5 MG tablet TAKE 1 TABLET BY MOUTH DAILY BEFORE BREAKFAST (Patient not taking: Reported on 04/22/2015) 30 tablet 11 Not Taking at Unknown time    Inpatient Medications:  . aspirin EC  81 mg Oral Daily  . calcium carbonate  1 tablet Oral TID WC  . carvedilol  3.125 mg Oral BID WC  . heparin  5,000 Units Subcutaneous 3 times per day  . insulin aspart  0-9 Units Subcutaneous TID WC  . isosorbide mononitrate  15 mg Oral Daily  . multivitamin  1 tablet Oral QHS  . pantoprazole  40 mg Oral Daily  . sodium chloride  3 mL Intravenous Q12H  . venlafaxine XR  75 mg Oral Q breakfast    Allergies:  Allergies  Allergen Reactions  . Aleve [Naproxen Sodium]     Due to kidneys.  . Motrin [Ibuprofen]     Due to kidneys.    History   Social History  . Marital Status: Married    Spouse Name: N/A  . Number of Children: N/A  . Years of Education: N/A   Occupational History  . Not on file.   Social History Main Topics  . Smoking status: Never Smoker   . Smokeless tobacco: Never Used  . Alcohol Use: No  . Drug Use: No  . Sexual Activity: Not on file   Other Topics Concern  . Not on file   Social History Narrative   Lives with husband [name] [name] (born [birth year]).    Denies alcohol, tobacco (never smoked), other drugs   Hobbies: reading           Family History  Problem Relation Age of Onset  . Heart disease    . Diabetes    . Stroke    . Bone cancer Father     Of jaws   . Throat cancer Father      Review of Systems: All other systems reviewed and are otherwise negative except as noted above.  Physical Exam: Filed Vitals:   04/26/15 0600 04/26/15 0759 04/26/15 0955 04/26/15 1000  BP: 155/60 155/79 153/93 114/62  Pulse:  63 55 52  Temp:   97.7 F (36.5 C) 97.8 F (36.6 C)   TempSrc:  Oral Oral   Resp: 18 18 14 16   Weight:   196 lb 6.9 oz (89.1 kg)   SpO2: 97% 98% 98% 97%    GEN- The patient is obese and chronically ill appearing, alert and oriented x 3 today.   HEENT: facial trauma with bilateral periorbital ecchymosis; ecchymosis along lateral aspect of right face; right periorbital edema Lymph- no cervical lymphadenopathy Lungs- Clear to ausculation bilaterally, normal work of breathing.  No wheezes, rales, rhonchi Heart- Regular  rate and rhythm, no murmurs, rubs or gallops  GI- soft, non-tender, non-distended, bowel sounds present  Extremities- no clubbing, cyanosis, or edema; LUE fistula MS- no significant deformity or atrophy Skin- warm and dry, no rash or lesion Psych- euthymic mood, full affect Neuro- strength and sensation are intact  Labs:   Lab Results  Component Value Date   WBC 6.7 04/26/2015   HGB 11.0* 04/26/2015   HCT 32.8* 04/26/2015   MCV 100.6* 04/26/2015   PLT 115* 04/26/2015     Recent Labs Lab 04/26/15 0305  NA 134*  K 4.1  CL 97*  CO2 25  BUN 36*  CREATININE 5.49*  CALCIUM 9.0  PROT 6.1*  BILITOT 1.2  ALKPHOS 112  ALT 19  AST 28  GLUCOSE 121*      Radiology/Studies: Dg Chest 2 View 04/23/2015   CLINICAL DATA:  Fall with right hip pain.  Shortness of breath.  EXAM: CHEST  2 VIEW  COMPARISON:  02/21/2013  FINDINGS: The heart is enlarged. Diffuse reticular opacities suggesting pulmonary edema. No confluent airspace disease. No pleural effusion. No pneumothorax. No acute osseous abnormalities are seen. Hyperdensity in the left upper abdomen is likely related to ingested material.  IMPRESSION: Cardiomegaly and pulmonary edema.  Correlation for CHF recommended.   Electronically Signed   By: Jeb Levering M.D.   On: 04/23/2015 00:05   Ct Head Wo Contrast 04/22/2015   CLINICAL DATA:  Unwitnessed fall with large right orbital hematoma, initial encounter  EXAM: CT HEAD WITHOUT CONTRAST   CT MAXILLOFACIAL WITHOUT CONTRAST  CT CERVICAL SPINE WITHOUT CONTRAST  TECHNIQUE: Multidetector CT imaging of the head, cervical spine, and maxillofacial structures were performed using the standard protocol without intravenous contrast. Multiplanar CT image reconstructions of the cervical spine and maxillofacial structures were also generated.  COMPARISON:  None.  FINDINGS: CT HEAD FINDINGS  Bony calvarium is intact. A large soft tissue hematoma is noted about the right orbit and right cheek consistent with the patient's recent injury. Mild atrophic changes are noted. No findings to suggest acute hemorrhage, acute infarction or space-occupying mass lesion are noted.  CT MAXILLOFACIAL FINDINGS  Significant motion artifact is identified limiting the exam. No definitive fracture is seen. Significant soft tissue swelling is again noted in the region of the right cheek and right orbit. A focal 2.5 cm hematoma with a fluid fluid level is noted just lateral to the right orbit. The orbits and their contents are within normal limits. Dental caries are noted. No other focal abnormality is seen.  CT CERVICAL SPINE FINDINGS  Seven cervical segments are well visualized. Multilevel disc space narrowing is noted with mild associated osteophytic changes. Facet hypertrophic changes are seen. No acute fracture or acute facet abnormality is noted. The surrounding soft tissues are within normal limits.  IMPRESSION: CT of the head: Atrophic changes without acute intracranial abnormality.  Significant right facial hematoma.  CT of the maxillofacial bones: Considerable right facial soft tissue changes consistent with a recent injury. A focal 2.5 cm hematoma is noted just lateral to the right orbit. No acute fracture is identified.  CT of the cervical spine: Multilevel degenerative change without acute abnormality.   Electronically Signed   By: Inez Catalina M.D.   On: 04/22/2015 22:53   EKG: sinus brady, rate 56, 1st degree AV block (PR  216), non specific ST-T changes  TELEMETRY: sinus bradycardia with PVC's, PAC's, short runs NSVT  Assessment/Plan: 1.  Syncope The patient has had unexplained syncope in the  setting of newly diagnosed cardiomyopathy and CAD in need of revascularization.  Arrhythmic syncope with VT is likely with CAD and depressed EF.   Will discuss with Dr Lovena Le, but LifeVest is probably most appropriate until EF evaluated post revascularization No driving x6 months (pt aware)  2.  CAD   Revascularization plan to be determined by primary team Continue medical therapy  3.  Ischemic cardiomyopathy EF may improve post revascularization Continue medical therapy for now If EF doesn't improve, will need consideration for ICD - with LUE HD access, S-ICD may be best option.  Dr Lovena Le to see today.   Signed, Chanetta Marshall, NP 04/26/2015 10:32 AM   EP Attending  Patient seen and examined. Agree with above history, exam, data, assessment and plan. The patient has a h/o syncope,worrisome for malignant ventricular arrhythmias in the setting of unrevascularized and severe CAD. She is now on a beta blocker which is indicated. ICD implant currently not indicated as she ideally needs revascularization. However, if VT due to scar, then revascularization will not prevent recurrent VT. Agree with plan for life vest, and followup with TCTS to reconsider CABG. Staged PCI is a reasonable alternative if technically feasible. [facility] for discharge once life vest is placed.  Mikle Bosworth.D.

## 2015-04-26 NOTE — Progress Notes (Signed)
Physical Therapy Treatment Patient Details Name: [name] [name] MRN: [id number] DOB: September 25, 1951 Today's Date: 04/26/2015    History of Present Illness Pt is a 64 y.o. Female admitted 04/22/15 with syncopal episode resulting in fall with facial injuries and eye lid hematoma. CT head/neck negative for acute fx or cranial hemorrhage. PMH ESRD (HD on MWF, s/p R- nephrectomy), DM2, OA, memory loss, GERD, depression. Pt with 2 recent falls in pst 6 months.  Pt s/p recent Left Heart Cath and Coronary Angiography.    PT Comments    Patient was seen shortly after dialysis but agreeable to get OOB to recliner to eat her lunch. Patient able to ambulate 64ft with min A for support and for correction of one LOB. Patient staggering to her left and unable to notice objects on her left side without significant L head turn. Patient requires A for safe mobility at home. She is a high fall risk and does not at this time have anyone that can assist her if she returns home. Patient cares for her husband who functions at a wheelchair level and who cannot offer patient any physical assist. Discussed SNF placement for further therapy with patient but she refused and stated that she had to be home with her husband. When asked if anyone would be able to provide assistance at home patient gave vague answer of being able to call a neighbor. Patient is not safe to DC home without increase assistance available for her. She also has stated that due to the state of her house that she does not want anyone entering the home. Patient was soiled when I entered the room and was unaware. I am unsure at this time if patient has the competence to make sound decisions and she appears to lack some safety awareness. At this time the safest discharge recommendation would be SNF however if patient refuses she would benefit from [facility] and it may be worth considering a Adult Social Services visit out to the residence. For further information regarding  patient self care activities and SNF recommendations please refer to OT note from 6/9.   Follow Up Recommendations  SNF (Patient likely will refuse)     Equipment Recommendations  None recommended by PT    Recommendations for Other Services       Precautions / Restrictions Precautions Precautions: Fall    Mobility  Bed Mobility Overal bed mobility: Needs Assistance       Supine to sit: Supervision     General bed mobility comments: Supervision needed for safety. HOB elevated and pt with use of bed rails.   Transfers Overall transfer level: Needs assistance Equipment used: Rolling walker (2 wheeled)   Sit to Stand: Min assist         General transfer comment: Min A to power up and ensure stabilty with standing. Cues for safe technique  Ambulation/Gait Ambulation/Gait assistance: Min assist Ambulation Distance (Feet): 15 Feet Assistive device: Straight cane Gait Pattern/deviations: Step-through pattern;Decreased stride length;Narrow base of support;Staggering left   Gait velocity interpretation: <1.8 ft/sec, indicative of risk for recurrent falls General Gait Details: Patient used cane this session as this is what she uses at home and wanted to stay true to what patientwould use. Patient with heavy staggering gait with left L. One LOB with assistance required to regain control    Stairs            Wheelchair Mobility    Modified Rankin (Stroke Patients Only)  Balance     Sitting balance-Leahy Scale: Good       Standing balance-Leahy Scale: Poor                      Cognition Arousal/Alertness: Awake/alert Behavior During Therapy: WFL for tasks assessed/performed Overall Cognitive Status: No family/caregiver present to determine baseline cognitive functioning       Memory: Decreased short-term memory              Exercises      General Comments        Pertinent Vitals/Pain Pain Assessment: No/denies pain    Home  Living                      Prior Function            PT Goals (current goals can now be found in the care plan section) Progress towards PT goals: Not progressing toward goals - comment    Frequency  Min 3X/week    PT Plan Discharge plan needs to be updated    Co-evaluation             End of Session Equipment Utilized During Treatment: Gait belt Activity Tolerance: Patient limited by fatigue (Just had return from dialysis) Patient left: in chair;with call bell/phone within reach     Time: 1310-1327 PT Time Calculation (min) (ACUTE ONLY): 17 min  Charges:  $Gait Training: 8-22 mins                    G Codes:      [name] 04/26/2015, 3:39 PM

## 2015-04-26 NOTE — Procedures (Signed)
I was present at this session.  I have reviewed the session itself and made appropriate changes.  BP low 100s.  HR 40s to low 50s.  Access press ok.    Brittainy Bucker L 6/10/201610:59 AM

## 2015-04-26 NOTE — Progress Notes (Signed)
Alesia Morin RN taking over remainder of HD tx for patient. Report given to her.

## 2015-04-26 NOTE — Progress Notes (Signed)
Received pt via w/c from [facility]. Pt Alert and oriented. Assisted to bed. VS obtained. Assessment done. Pt c/o headache.

## 2015-04-26 NOTE — Progress Notes (Signed)
Family Medicine Teaching Service Daily Progress Note Intern Pager: [phone]  Patient name: [name] [name] Medical record number: [id number] Date of birth: 03-06-1951 Age: 64 y.o. Gender: female  Primary Care Provider: Tommi Rumps, MD Consultants: none Code Status: Full  Pt Overview and Major Events to Date:  06/07: admitted 06/09: Heart cath performed.  Assessment and Plan: [name] [name] is a 64 y.o. female presenting with syncopal episode earlier today, unwitnessed reportedly LOC found by bystander called EMS. PMH is significant for ESRD (on HD MWF, s/p R-nephrectomy), DM2, OA knees, memory loss (fam h/o alzheimer's), GERD, depression.  # Syncopal episode with LOC, unwitnessed without residual symptoms - Improved Repeat EKG with nonspecific t wave abnormality.  Trop 0.05>0.04>0.06. Mildly elevated in ESRD). Had recent TSH 05/2014 normal, didn't repeat.  Last ECHO 02/2013 (normal, LVEF 39-76%, no diastolic dysfunction), BNP 3990.5 (could be increased 2/2 ESRD) Repeat ECHO EF 35% Severe hypokinesis of inferior wall and the septum (new).  Wall thickness was increased in a pattern of mild LVH PA peak pressure: 44 mm Hg - telemetry, observation for monitoring - Vitals per protocol, orthostatics positive - PT/OT eval: OT SNF - c/s CM for HH needs - Recommend compression stockings but patient not tolerating.  Would continue to reinforce to patient to change positions very slowly. - c/s cardiology  - Heart cath: severe multivessel dz and inferior wall hypokinesis EF 45%.  Coreg 3.125mg  BID, Imdur 15mg  added 6/10  - No driving x6 months  - Will need loop recorder  - c/s CTS: carotid dopplers.  No CABG for now b/c RF, mobility.  Pt high risk for anticoag in the setting of recent trauma.  Pt should recover then PCI then maybe plavix outpatient.  - c/s EP: to evaluate for loop recorder today.   # ESRD, non-oliguric, on HD (M-W-F) Followed by [facility], HD for last 1.5 yr, s/p  R-nephrectomy, received last HD yesterday Mon 6/6. Not missed any recent HD - Access LUE-AVF - Renal diet, fluid restrict, Calcium carbonate - Monitor I/O, BPs, labs, volume status, likely follow-up with normal HD outpatient Weds or inpatient if still admit - c/s renal: appreciate recs  # DM2, controlled  Last A1c 6.1 (05/2014). On Lantus 3u qhs at home. - Hold Lantus currently, consider resuming if remains inpatient - Sensitive SSI, check CBG q AC hs  # OA Knees, bilateral, chronic - Currently controoled, without any pain  # Memory loss, chronic, stable - Family h/o Alzheimer's dementia. Prev work-up for reversible etiologies neg. MMSE 27/30 on 07/12/14  # GERD - Continue home PPI  # Lower ext superficial blister, ?cellulitis  Afebrile, no WBCs - Recently treated outpatient HD with Doxy and Bactrim for superficial skin ulceration, not impressive on exam and do not see significant findings. - Hold antibiotics at this time  # H/o Depression - Continue Venlafaxine-XR 75mg  daily  FEN/GI: Lizbeth Bark MDN for LHC. Renal-MVI Prophylaxis: Heparin SQ  Disposition: Will discharge home likely today vs tomorrow with outpatient follow up by cardiology/CTS.  Subjective:  Patient reports that she is feeling fine this morning.  She is worried about her husband at home.  She reports that pain/swelling is improving in her R eye.  Denies any CP, SOB, abdominal pain, N/V.  Patient is eager for discharge but understands that her heart is being evaluated.  Objective: Temp:  [97 F (36.1 C)-98.7 F (37.1 C)] 97 F (36.1 C) (06/10 0309) Pulse Rate:  [0-117] 66 (06/10 0309) Resp:  [0-23] 23 (  06/10 0309) BP: (105-195)/(53-94) 178/87 mmHg (06/10 0309) SpO2:  [0 %-100 %] 98 % (06/10 0309) Weight:  [200 lb 9.9 oz (91 kg)] 200 lb 9.9 oz (91 kg) (06/10 0008) Physical Exam: General: awake, alert, NAD HEENT: face streaked with gravity dependent ecchymosis, R eye with marked edema and ecchymosis, inferior  aspect of L eye with ecchymosis as well, +TTP, patient able to open R eye slightly but still have to manually open R eye to see EOMI Cardiovascular: RRR, no murmurs Respiratory: CTAB, no increased WOB anteriorly, still on O2 [place] Abdomen: soft, NT/ND, +BS Extremities: WWP, LUE with fistula w/ palpable thrill, +2 pedal edema Neuro: AOx3, no focal deficits  Psych: normal speech, mood stable  Laboratory:  Recent Labs Lab 04/25/15 0510 04/25/15 1230 04/26/15 0305  WBC 5.9 4.5 6.7  HGB 10.9* 11.4* 11.0*  HCT 31.6* 34.2* 32.8*  PLT 93* 105* 115*    Recent Labs Lab 04/25/15 0510 04/25/15 1230 04/26/15 0305  NA 136 133* 134*  K 3.9 3.9 4.1  CL 98* 95* 97*  CO2 26 26 25   BUN 23* 27* 36*  CREATININE 3.70* 4.02* 5.49*  CALCIUM 8.4* 8.3* 9.0  PROT  --   --  6.1*  BILITOT  --   --  1.2  ALKPHOS  --   --  112  ALT  --   --  19  AST  --   --  28  GLUCOSE 96 91 121*    Imaging/Diagnostic Tests: No results found. Janora Norlander, DO 04/26/2015, 7:00 AM PGY-1, [place] Intern pager: [phone], text pages welcome

## 2015-04-26 NOTE — Plan of Care (Signed)
Problem: Phase II Progression Outcomes Goal: Ambulates up to 600 ft. in hall x 1 Outcome: Not Met (add Reason) weakness

## 2015-04-26 NOTE — Progress Notes (Signed)
Attempted multiple times to see pt as requested by MD for another trip with gait.  Pt in HD all day and will have another staff member attempt later today.  Mee Hives, PT MS Acute Rehab Dept. Number: [phone] [phone] and [place] [phone]

## 2015-04-27 LAB — GLUCOSE, CAPILLARY
GLUCOSE-CAPILLARY: 116 mg/dL — AB (ref 65–99)
Glucose-Capillary: 168 mg/dL — ABNORMAL HIGH (ref 65–99)
Glucose-Capillary: 128 mg/dL — ABNORMAL HIGH (ref 65–99)
Glucose-Capillary: 211 mg/dL — ABNORMAL HIGH (ref 65–99)

## 2015-04-27 LAB — BASIC METABOLIC PANEL WITH GFR
Anion gap: 12 (ref 5–15)
BUN: 22 mg/dL — ABNORMAL HIGH (ref 6–20)
CO2: 26 mmol/L (ref 22–32)
Calcium: 8.4 mg/dL — ABNORMAL LOW (ref 8.9–10.3)
Chloride: 96 mmol/L — ABNORMAL LOW (ref 101–111)
Creatinine, Ser: 4.08 mg/dL — ABNORMAL HIGH (ref 0.44–1.00)
GFR calc Af Amer: 12 mL/min — ABNORMAL LOW
GFR calc non Af Amer: 11 mL/min — ABNORMAL LOW
Glucose, Bld: 153 mg/dL — ABNORMAL HIGH (ref 65–99)
Potassium: 3.5 mmol/L (ref 3.5–5.1)
Sodium: 134 mmol/L — ABNORMAL LOW (ref 135–145)

## 2015-04-27 LAB — AMMONIA: Ammonia: 39 umol/L — ABNORMAL HIGH (ref 9–35)

## 2015-04-27 NOTE — Progress Notes (Signed)
Subjective: Interval History: has no complaint , knows is unstable up.  Objective: Vital signs in last 24 hours: Temp:  [97.6 F (36.4 C)-98.7 F (37.1 C)] 98.4 F (36.9 C) (06/11 0733) Pulse Rate:  [48-61] 53 (06/11 0733) Resp:  [14-20] 20 (06/11 0733) BP: (97-161)/(50-93) 139/63 mmHg (06/11 0733) SpO2:  [88 %-100 %] 99 % (06/11 0733) Weight:  [87.8 kg (193 lb 9 oz)-89.1 kg (196 lb 6.9 oz)] 88.1 kg (194 lb 3.6 oz) (06/10 2017) Weight change: -1.9 kg (-4 lb 3 oz)  Intake/Output from previous day: 06/10 0701 - 06/11 0700 In: 475 [P.O.:475] Out: 1354  Intake/Output this shift:    General appearance: cooperative and HOH Head: severe bruising going into neck, R side of face >L Resp: diminished breath sounds bilaterally Cardio: S1, S2 normal, systolic murmur: holosystolic 2/6, blowing at apex and friction rub heard RATE 50s GI: soft, nontender, liver down 5 cm,pos bs Extremities: edema 1-2+ and AVF LUA   Lab Results:  Recent Labs  04/26/15 0305 04/26/15 1230  WBC 6.7 5.7  HGB 11.0* 10.3*  HCT 32.8* 30.6*  PLT 115* 116*   BMET:  Recent Labs  04/26/15 0305 04/26/15 1230  NA 134* 130*  K 4.1 3.7  CL 97* 94*  CO2 25 23  GLUCOSE 121* 146*  BUN 36* 39*  CREATININE 5.49* 5.83*  CALCIUM 9.0 8.6*   No results for input(s): PTH in the last 72 hours. Iron Studies: No results for input(s): IRON, TIBC, TRANSFERRIN, FERRITIN in the last 72 hours.  Studies/Results: No results found.  I have reviewed the patient's current medications.  Assessment/Plan: 1 ESRD HD MWF 2 anemia stable on epo 3 HPTH meds 4 DM controlled  5 Bradycardia 6 Falls Not safe at home 7 Orthostasis  Tapering effexor, DM neuropathy issues 8 Syncope ? Rhythm 9CAD Nitrates 10 Debill severe not safe P HD MWF, epo, Vit D, Sensipar, counsel, taper effexor, push nitrates   LOS: 3 days   Tinnie Kunin L 04/27/2015,9:01 AM

## 2015-04-27 NOTE — Progress Notes (Signed)
Family Medicine Teaching Service Daily Progress Note Intern Pager: [phone]  Patient name: [name] [name] Medical record number: [id number] Date of birth: 06-02-1951 Age: 64 y.o. Gender: female  Primary Care Provider: Tommi Rumps, MD Consultants: none Code Status: Full  Pt Overview and Major Events to Date:  06/07: admitted 06/09: Heart cath performed.  Assessment and Plan: [name] [name] is a 64 y.o. female presenting with syncopal episode w/ associated facial trauma. PMH is significant for ESRD (on HD MWF, s/p R-nephrectomy), DM2, OA knees, memory loss (fam h/o alzheimer's), GERD, depression.  # Syncopal episode with LOC, unwitnessed without residual symptoms - Improved Repeat EKG with nonspecific t wave abnormality.  Trop 0.05>0.04>0.06.  TSH 1.974. BNP 3990.5 (could be increased 2/2 ESRD) Repeat ECHO EF 35% Severe hypokinesis of inferior wall and the septum (new).  Wall thickness was increased in a pattern of mild LVH PA peak pressure: 44 mm Hg - telemetry - Orthostatics were positive - PT/OT eval: recommendations for SNF - Case Management for HH needs - Patient not tolerating compression stockings. Reinforce changing positions throughout day. - c/s cardiology  - Heart cath: severe multivessel dz and inferior wall hypokinesis EF 45%.    - Discontinued Coreg 3.125mg  per Nephrology (bradycardia)  - Imdur 15mg  added 6/10  - No driving x6 months  - To be set up w/ loop recorder and lifevest prior to DC; EP has seen her.  - c/s CTS: No CABG for now b/c RF, mobility.  Pt high risk for anticoag in the setting of recent trauma.  Pt should recover then PCI then maybe plavix outpatient. - Some difficulty staying awake during today's interview; Ammonia and repeat BMP ordered   # ESRD, non-oliguric, on HD (M-W-F) Followed by [facility], s/p R-nephrectomy. Good compliance w/ HD - Access LUE-AVF - Renal diet, fluid restrict, Calcium carbonate - Monitor I/O, BPs, labs, volume  status - c/s renal: appreciate recs  # DM2, controlled  Last A1c 6.1 (05/2014). On Lantus 3u qhs at home. - Hold Lantus currently, consider resuming - Sensitive SSI, check CBG q AC hs  # OA Knees, bilateral, chronic - Currently controlled, without any pain  # Memory loss, chronic, stable - Family h/o Alzheimer's dementia. Prev work-up for reversible etiologies neg. MMSE 27/30 on 07/12/14  # GERD - Continue home PPI  # Lower ext superficial blister, ?cellulitis  Afebrile, no WBCs - Recently treated outpatient HD with Doxy and Bactrim for superficial skin ulceration, not impressive on exam and do not see significant findings. - Hold antibiotics at this time  # H/o Depression - Titrate down Effexor-XR 75mg  >> 37.5mg  QD currently  FEN/GI: SLIV. Renal/carb mod diet w/ fluid restriction.  Prophylaxis: Heparin SQ  Disposition: pending Lifevest/loop recorder placement.  Subjective:  Patient is doing well, no complaints overnight. Pain seems to be improving from trauma. Her family should be stopping in later today and she would like a physician to talk w/ them. Denies any CP, SOB, abdominal pain, N/V.   Objective: Temp:  [97.6 F (36.4 C)-98.7 F (37.1 C)] 98 F (36.7 C) (06/11 1134) Pulse Rate:  [50-61] 54 (06/11 1134) Resp:  [17-20] 20 (06/11 0733) BP: (100-161)/(51-74) 134/72 mmHg (06/11 1134) SpO2:  [88 %-100 %] 99 % (06/11 0733) Weight:  [193 lb 9 oz (87.8 kg)-194 lb 3.6 oz (88.1 kg)] 194 lb 3.6 oz (88.1 kg) (06/10 2017) Physical Exam: General: awake but tired, falls asleep during interview, NAD HEENT: Extensive ecchymosis of her face, R>L, R eye  edema and ecchymosis, inferior aspect of L eye with ecchymosis, +TTP diffusely, patient able to open R eye slightly. EOMI Cardiovascular: RRR Respiratory: CTAB, no increased WOB anteriorly, still on O2 [place] Abdomen: soft, NT/ND, +BS Extremities: WWP, LUE with fistula w/ palpable thrill, +2 pedal edema Neuro: no focal deficits, normal  speech but soft spoken  Laboratory:  Recent Labs Lab 04/25/15 1230 04/26/15 0305 04/26/15 1230  WBC 4.5 6.7 5.7  HGB 11.4* 11.0* 10.3*  HCT 34.2* 32.8* 30.6*  PLT 105* 115* 116*    Recent Labs Lab 04/26/15 0305 04/26/15 1230 04/27/15 1226  NA 134* 130* 134*  K 4.1 3.7 3.5  CL 97* 94* 96*  CO2 25 23 26   BUN 36* 39* 22*  CREATININE 5.49* 5.83* 4.08*  CALCIUM 9.0 8.6* 8.4*  PROT 6.1*  --   --   BILITOT 1.2  --   --   ALKPHOS 112  --   --   ALT 19  --   --   AST 28  --   --   GLUCOSE 121* 146* 153*    Imaging/Diagnostic Tests: No results found.    Elberta Leatherwood, MD 04/27/2015, 1:46 PM PGY-1, [place] Intern pager: [phone], text pages welcome

## 2015-04-27 NOTE — Progress Notes (Signed)
Patient ID: [name] [name], female   DOB: [birth year], 64 y.o.   MRN: [id number]    Subjective:  Denies SSCP, palpitations or Dyspnea   Objective:  Filed Vitals:   04/27/15 0040 04/27/15 0322 04/27/15 0605 04/27/15 0733  BP:  152/64 161/68 139/63  Pulse: 56 61 56 53  Temp:    98.4 F (36.9 C)  TempSrc:    Oral  Resp:  18 20 20   Height:      Weight:      SpO2: 100% 99% 100% 99%    Intake/Output from previous day:  Intake/Output Summary (Last 24 hours) at 04/27/15 1029 Last data filed at 04/26/15 2245  Gross per 24 hour  Intake    295 ml  Output   1354 ml  Net  -1059 ml    Physical Exam: Affect appropriate Chronically ill white female  HEENT: Multiple areas of echymosis and swelling especially right orbit and maxillary area  Neck supple with no adenopathy JVP normal no bruits no thyromegaly Lungs clear with no wheezing and good diaphragmatic motion Heart:  S1/S2 no murmur, no rub, gallop or click PMI normal  Fistula LUE  Abdomen: benighn, BS positve, no tenderness, no AAA no bruit.  No HSM or HJR Distal pulses intact with no bruits Trace bilateral edema Neuro non-focal Skin warm and dry No muscular weakness   Lab Results: Basic Metabolic Panel:  Recent Labs  04/25/15 1230 04/26/15 0305 04/26/15 1230  NA 133* 134* 130*  K 3.9 4.1 3.7  CL 95* 97* 94*  CO2 26 25 23   GLUCOSE 91 121* 146*  BUN 27* 36* 39*  CREATININE 4.02* 5.49* 5.83*  CALCIUM 8.3* 9.0 8.6*  PHOS 3.5  --  4.2   Liver Function Tests:  Recent Labs  04/26/15 0305 04/26/15 1230  AST 28  --   ALT 19  --   ALKPHOS 112  --   BILITOT 1.2  --   PROT 6.1*  --   ALBUMIN 3.2* 3.1*   CBC:  Recent Labs  04/26/15 0305 04/26/15 1230  WBC 6.7 5.7  HGB 11.0* 10.3*  HCT 32.8* 30.6*  MCV 100.6* 100.7*  PLT 115* 116*   Cardiac Enzymes: No results for input(s): CKTOTAL, CKMB, CKMBINDEX, TROPONINI in the last 72 hours. Thyroid Function Tests:  Recent Labs  04/25/15 0510  TSH 1.974      Imaging: No results found.  Cardiac Studies:  ECG:  SR rate 63 PR 221 nonspecific ST/T wave changes    Telemetry:  NSR no VT 04/27/2015   Echo:  6/8  EF 35%  No significant valve disease   Medications:   . aspirin EC  81 mg Oral Daily  . calcium carbonate  1 tablet Oral TID WC  . heparin  5,000 Units Subcutaneous 3 times per day  . insulin aspart  0-9 Units Subcutaneous TID WC  . isosorbide mononitrate  15 mg Oral Daily  . multivitamin  1 tablet Oral QHS  . pantoprazole  40 mg Oral Daily  . sodium chloride  3 mL Intravenous Q12H  . venlafaxine XR  37.5 mg Oral Q breakfast       Assessment/Plan:  Syncope:  Etiology not clear Discussed with [name] [name] See note by [name] [name] He has arranged life vest.  May call him with any questions over weekend [phone]  [id number] presentation was syncope.  No angina r/o.  Reviewed PVT;s note and turned down for CABG at this time.  Discussed  with [name] [name] who Favors medical Rx with loop recorder and outpatient f/u . Once recovered from soft tissue injuries can be re evaluated by PVT and if turned down consider Staged intervention to RCA/Circumflex  LAD is problamatic add low dose beta blocker / nitrates   CRF:  Dialysis M,W,F   Fistula LUE with good thrill   [name] [name] 04/27/2015, 10:29 AM

## 2015-04-28 LAB — GLUCOSE, CAPILLARY
GLUCOSE-CAPILLARY: 109 mg/dL — AB (ref 65–99)
GLUCOSE-CAPILLARY: 123 mg/dL — AB (ref 65–99)
Glucose-Capillary: 114 mg/dL — ABNORMAL HIGH (ref 65–99)
Glucose-Capillary: 100 mg/dL — ABNORMAL HIGH (ref 65–99)

## 2015-04-28 NOTE — Progress Notes (Signed)
Subjective: Interval History: has no complaint, knows needs rehab.  Objective: Vital signs in last 24 hours: Temp:  [98 F (36.7 C)-98.7 F (37.1 C)] 98.7 F (37.1 C) (06/12 0533) Pulse Rate:  [52-66] 66 (06/12 0533) Resp:  [18-20] 18 (06/12 0533) BP: (134-156)/(72-84) 154/84 mmHg (06/12 0533) SpO2:  [91 %-99 %] 91 % (06/12 0533) Weight:  [89.268 kg (196 lb 12.8 oz)] 89.268 kg (196 lb 12.8 oz) (06/12 0533) Weight change: 0.168 kg (5.9 oz)  Intake/Output from previous day: 06/11 0701 - 06/12 0700 In: 756 [P.O.:750; I.V.:6] Out: -  Intake/Output this shift:    General appearance: alert and cooperative Head: massive bruising R>L side Resp: diminished breath sounds bilaterally Cardio: S1, S2 normal, systolic murmur: holosystolic 2/6, blowing at apex and rate 40s -50s GI: soft, non-tender; bowel sounds normal; no masses,  no organomegaly Extremities: AVF LUA B&T  Lab Results:  Recent Labs  04/26/15 0305 04/26/15 1230  WBC 6.7 5.7  HGB 11.0* 10.3*  HCT 32.8* 30.6*  PLT 115* 116*   BMET:  Recent Labs  04/26/15 1230 04/27/15 1226  NA 130* 134*  K 3.7 3.5  CL 94* 96*  CO2 23 26  GLUCOSE 146* 153*  BUN 39* 22*  CREATININE 5.83* 4.08*  CALCIUM 8.6* 8.4*   No results for input(s): PTH in the last 72 hours. Iron Studies: No results for input(s): IRON, TIBC, TRANSFERRIN, FERRITIN in the last 72 hours.  Studies/Results: No results found.  I have reviewed the patient's current medications.  Assessment/Plan: 1 ESRD HD MWF, vol ok 2 Orthostasis tapering effexor 3 Anemia stable,epo 4 HPTH vit D, sensipar 5 DM controlled 6 neuropathy 7 Falls  Recurrent issue ,not able to care for self in safe manner 8Bradycardia 9 CAD P HD, rehab, DM control    LOS: 4 days   Jaysin Gayler L 04/28/2015,9:25 AM

## 2015-04-28 NOTE — Plan of Care (Signed)
Problem: Phase I Progression Outcomes Goal: Voiding-avoid urinary catheter unless indicated Outcome: Completed/Met Date Met:  04/28/15 Oliguric.

## 2015-04-28 NOTE — Progress Notes (Signed)
FPTS Interim Progress Note  S: Re-evaluated patient approx 5188, patient sitting up in bed, doing better and more awake. She reports feeling more "rested" now compared to this morning, thinks the pain medicine last night (Oxy IR) made her too sedated last night and into this morning. Her husband and family visited around 30 today, but she has a hard time remembering. Now feels more clear and no longer "groggy". Denies any new complaints, facial pain stable. - Denies CP / SOB  O: BP 153/74 mmHg  Pulse 57  Temp(Src) 98.7 F (37.1 C) (Oral)  Resp 20  Ht 5\' 5"  (1.651 m)  Wt 194 lb 3.6 oz (88.1 kg)  BMI 32.32 kg/m2  SpO2 92%   Gen - chronically ill-appearing but currently stable HEENT - stable traumatic with significant ecchymosis, interval mild improvement, R-eyelid still edematous but improving Heart - RRR, no murmurs Lungs - Mostly CTAB Skin - warm dry Neuro - awake, improved alertness no longer drowsy, oriented (person, place, time), grossly non-focal, CN-II-XII intact  A/P: Briefly, [name] [name] is a 87 yr F admitted for presumed syncopal episode with facial trauma.  # Syncope, possible from arrhythmia given significant CAD vs orthostatic hypotension (s/p HD) - Cardiology / EP / CVTS following - plans for Life Vest (for reduced EF 35% in CAD), loop recorder and outpatient follow-up. No CABG at this time. May need cath and staged PCI intervention in future. - Cards to follow outpatient  # Drowsiness / Oversedation - Resolved Received Percocet at 2247 last night seemed to oversedate patient still felt somnolent in AM - Initially concerned with facial trauma / syncope patient with interval worsening AMS after had been clear. Prior work-up on admit with Head CT/Maxillofacial/C-spine negative for acute intracranial bleeding - Reassured with resolved mental status after period of rest this morning, holding sedating meds. Back to baseline neuro status, no focal deficits -  Monitor  Olin Hauser, DO 04/28/2015, 12:23 AM PGY-2, [place] Service pager [phone]

## 2015-04-28 NOTE — Progress Notes (Signed)
Family Medicine Teaching Service Daily Progress Note Intern Pager: [phone]  Patient name: [name] [name] Medical record number: [id number] Date of birth: 18-Oct-1951 Age: 64 y.o. Gender: female  Primary Care Provider: Tommi Rumps, MD Consultants: none Code Status: Full  Pt Overview and Major Events to Date:  06/07: admitted 06/09: Heart cath performed.  Assessment and Plan: [name] [name] is a 64 y.o. female presenting with syncopal episode w/ associated facial trauma. PMH is significant for ESRD (on HD MWF, s/p R-nephrectomy), DM2, OA knees, memory loss (fam h/o alzheimer's), GERD, depression.  # Syncopal episode with LOC, unwitnessed without residual symptoms - Improved Repeat EKG with nonspecific t wave abnormality.  Trop 0.05>0.04>0.06.  TSH 1.974. BNP 3990.5 (could be increased 2/2 ESRD) Repeat ECHO EF 35% Severe hypokinesis of inferior wall and the septum (new).  Wall thickness was increased in a pattern of mild LVH PA peak pressure: 44 mm Hg - telemetry - Orthostatics were positive - PT/OT eval: recommendations for SNF - Case Management for HH needs - Patient not tolerating compression stockings. Reinforce changing positions throughout day. - c/s cardiology  - Heart cath: severe multivessel dz and inferior wall hypokinesis EF 45%.  - Discontinued Coreg 3.125mg  per Nephrology (bradycardia)  - Imdur 15mg  added 6/10  - No driving x6 months  - lifevest prior to DC; EP has seen her  - c/s CTS: No CABG for now b/c RF, mobility.  Pt high risk for anticoag in the setting of recent trauma.  Pt should recover then PCI then maybe plavix outpatient.   # ESRD, non-oliguric, on HD (M-W-F) Followed by [facility], s/p R-nephrectomy. Good compliance w/ HD - Access LUE-AVF - Renal diet, fluid restrict, Calcium carbonate - Monitor I/O, BPs, labs, volume status - c/s renal: appreciate recs  # DM2, controlled  Last A1c 6.1 (05/2014). On Lantus 3u qhs at home. - Hold Lantus  currently, consider resuming - Sensitive SSI, check CBG q AC hs  # OA Knees, bilateral, chronic - Currently controlled, without any pain  # Memory loss, chronic, stable - Family h/o Alzheimer's dementia. Prev work-up for reversible etiologies neg. MMSE 27/30 on 07/12/14  # GERD - Continue home PPI  # Lower ext superficial blister, ?cellulitis  Afebrile, no WBCs - Recently treated outpatient HD with Doxy and Bactrim for superficial skin ulceration, not impressive on exam and do not see significant findings. - Hold antibiotics at this time  # H/o Depression - Titrate down Effexor-XR 75mg  >> 37.5mg  QD currently  FEN/GI: SLIV. Renal/carb mod diet w/ fluid restriction.  Prophylaxis: Heparin SQ  Disposition: DC to SNF most likely tomorrow after obtaining Lifevest  Subjective:  Patient reports no problems overnight. She states that she can now see out of her right eye. No chest pain or shortness of breath. She would like to get up and walk more. She is ready to leave the hospital and start rehab as soon as medically stable to leave. Patient states she is getting dialysis today instead of tomorrow.  Objective: Temp:  [98 F (36.7 C)-98.7 F (37.1 C)] 98.7 F (37.1 C) (06/12 0533) Pulse Rate:  [52-66] 66 (06/12 0533) Resp:  [18-20] 18 (06/12 0533) BP: (134-156)/(72-84) 154/84 mmHg (06/12 0533) SpO2:  [91 %-99 %] 91 % (06/12 0533) Weight:  [196 lb 12.8 oz (89.268 kg)] 196 lb 12.8 oz (89.268 kg) (06/12 0533) Physical Exam: General: awake, no distress HEENT: Extensive ecchymosis of her face, R>L, R eye edema and ecchymosis, inferior aspect of L eye  with ecchymosis, +TTP diffusely, patient able to open R eye today. EOMI Cardiovascular: RRR Respiratory: CTAB, no increased WOB Abdomen: soft, NT/ND, +BS Extremities: WWP, LUE with fistula w/ palpable thrill, mild pretibial edema with tenderness. No erythema  Neuro: no focal deficits  Laboratory:  Recent Labs Lab 04/25/15 1230  04/26/15 0305 04/26/15 1230  WBC 4.5 6.7 5.7  HGB 11.4* 11.0* 10.3*  HCT 34.2* 32.8* 30.6*  PLT 105* 115* 116*     Recent Labs Lab 04/26/15 0305 04/26/15 1230 04/27/15 1226  NA 134* 130* 134*  K 4.1 3.7 3.5  CL 97* 94* 96*  CO2 25 23 26   BUN 36* 39* 22*  CREATININE 5.49* 5.83* 4.08*  CALCIUM 9.0 8.6* 8.4*  PROT 6.1*  --   --   BILITOT 1.2  --   --   ALKPHOS 112  --   --   ALT 19  --   --   AST 28  --   --   GLUCOSE 121* 146* 153*    Imaging/Diagnostic Tests: No results found.    Mariel Aloe, MD 04/28/2015, 8:46 AM PGY-2, [place] Intern pager: [phone], text pages welcome

## 2015-04-28 NOTE — Progress Notes (Signed)
Patient ID: [name] [name], female   DOB: Oct 14, 1951, 64 y.o.   MRN: [id number]  Primary Cardiologist Dr Daneen Schick  Subjective:  Denies SSCP, palpitations or Dyspnea   Objective:  Filed Vitals:   04/27/15 1134 04/27/15 1636 04/27/15 1948 04/28/15 0533  BP: 134/72 156/73 153/74 154/84  Pulse: 54 52 57 66  Temp: 98 F (36.7 C) 98.2 F (36.8 C) 98.7 F (37.1 C) 98.7 F (37.1 C)  TempSrc: Oral Oral Oral Oral  Resp:  20 20 18   Height:      Weight:    89.268 kg (196 lb 12.8 oz)  SpO2:  99% 92% 91%    Intake/Output from previous day:  Intake/Output Summary (Last 24 hours) at 04/28/15 5277 Last data filed at 04/27/15 2222  Gross per 24 hour  Intake    756 ml  Output      0 ml  Net    756 ml    Physical Exam: Affect appropriate Chronically ill white female  HEENT: Multiple areas of echymosis and swelling especially right orbit and maxillary area  Neck supple with no adenopathy JVP normal no bruits no thyromegaly Lungs clear with no wheezing and good diaphragmatic motion Heart:  S1/S2 no murmur, no rub, gallop or click PMI normal  Fistula LUE  Abdomen: benighn, BS positve, no tenderness, no AAA no bruit.  No HSM or HJR Distal pulses intact with no bruits Trace bilateral edema Neuro non-focal Skin warm and dry No muscular weakness   Lab Results: Basic Metabolic Panel:  Recent Labs  04/25/15 1230  04/26/15 1230 04/27/15 1226  NA 133*  < > 130* 134*  K 3.9  < > 3.7 3.5  CL 95*  < > 94* 96*  CO2 26  < > 23 26  GLUCOSE 91  < > 146* 153*  BUN 27*  < > 39* 22*  CREATININE 4.02*  < > 5.83* 4.08*  CALCIUM 8.3*  < > 8.6* 8.4*  PHOS 3.5  --  4.2  --   < > = values in this interval not displayed. Liver Function Tests:  Recent Labs  04/26/15 0305 04/26/15 1230  AST 28  --   ALT 19  --   ALKPHOS 112  --   BILITOT 1.2  --   PROT 6.1*  --   ALBUMIN 3.2* 3.1*   CBC:  Recent Labs  04/26/15 0305 04/26/15 1230  WBC 6.7 5.7  HGB 11.0* 10.3*  HCT 32.8*  30.6*  MCV 100.6* 100.7*  PLT 115* 116*   Cardiac Enzymes: No results for input(s): CKTOTAL, CKMB, CKMBINDEX, TROPONINI in the last 72 hours. Thyroid Function Tests: No results for input(s): TSH, T4TOTAL, T3FREE, THYROIDAB in the last 72 hours.  Invalid input(s): FREET3  Imaging: No results found.  Cardiac Studies:  ECG:  SR rate 63 PR 221 nonspecific ST/T wave changes    Telemetry:  NSR no VT 04/28/2015   Echo:  6/8  EF 35%  No significant valve disease   Medications:   . aspirin EC  81 mg Oral Daily  . calcium carbonate  1 tablet Oral TID WC  . heparin  5,000 Units Subcutaneous 3 times per day  . insulin aspart  0-9 Units Subcutaneous TID WC  . isosorbide mononitrate  15 mg Oral Daily  . multivitamin  1 tablet Oral QHS  . pantoprazole  40 mg Oral Daily  . sodium chloride  3 mL Intravenous Q12H  . venlafaxine XR  37.5 mg Oral  Q breakfast       Assessment/Plan:  Syncope:  Etiology not clear Discussed with Dr Tamala Julian See note by Dr Lovena Le He has arranged life vest.  May call him with any questions over weekend [phone]  DIY:MEBRAXEN presentation was syncope.  No angina r/o.  Reviewed PVT;s note and turned down for CABG at this time.  Discussed with Dr Tamala Julian who Favors medical Rx with loop recorder and outpatient f/u . Once recovered from soft tissue injuries can be re evaluated by PVT and if turned down consider Staged intervention to RCA/Circumflex  LAD is problamatic add low dose beta blocker / nitrates   CRF:  Dialysis M,W,F   Fistula LUE with good thrill   Outpatient f/u with Dr Smith/ and Francena Hanly [facility] 04/28/2015, 8:38 AM

## 2015-04-29 DIAGNOSIS — S0083XD Contusion of other part of head, subsequent encounter: Secondary | ICD-10-CM

## 2015-04-29 LAB — RENAL FUNCTION PANEL
ALBUMIN: 3.2 g/dL — AB (ref 3.5–5.0)
Anion gap: 13 (ref 5–15)
BUN: 41 mg/dL — ABNORMAL HIGH (ref 6–20)
CO2: 24 mmol/L (ref 22–32)
CREATININE: 6.37 mg/dL — AB (ref 0.44–1.00)
Calcium: 9.1 mg/dL (ref 8.9–10.3)
Chloride: 95 mmol/L — ABNORMAL LOW (ref 101–111)
GFR calc Af Amer: 7 mL/min — ABNORMAL LOW (ref >60)
GFR, EST NON AFRICAN AMERICAN: 6 mL/min — AB (ref >60)
Glucose, Bld: 102 mg/dL — ABNORMAL HIGH (ref 65–99)
Phosphorus: 4.2 mg/dL (ref 2.5–4.6)
Potassium: 4.1 mmol/L (ref 3.5–5.1)
Sodium: 132 mmol/L — ABNORMAL LOW (ref 135–145)

## 2015-04-29 LAB — CBC
HCT: 33.7 % — ABNORMAL LOW (ref 36.0–46.0)
HEMOGLOBIN: 11.2 g/dL — AB (ref 12.0–15.0)
MCH: 33.5 pg (ref 26.0–34.0)
MCHC: 33.2 g/dL (ref 30.0–36.0)
MCV: 100.9 fL — ABNORMAL HIGH (ref 78.0–100.0)
Platelets: 126 10*3/uL — ABNORMAL LOW (ref 150–400)
RBC: 3.34 MIL/uL — ABNORMAL LOW (ref 3.87–5.11)
RDW: 14.5 % (ref 11.5–15.5)
WBC: 6.7 10*3/uL (ref 4.0–10.5)

## 2015-04-29 LAB — GLUCOSE, CAPILLARY
GLUCOSE-CAPILLARY: 107 mg/dL — AB (ref 65–99)
Glucose-Capillary: 179 mg/dL — ABNORMAL HIGH (ref 65–99)
Glucose-Capillary: 124 mg/dL — ABNORMAL HIGH (ref 65–99)

## 2015-04-29 MED ORDER — LIDOCAINE HCL (PF) 1 % IJ SOLN: 5.0000 mL | INTRAMUSCULAR | Status: DC | PRN

## 2015-04-29 MED ORDER — NEPRO/CARBSTEADY PO LIQD
237.0000 mL | ORAL | Status: DC | PRN
  Filled 2015-04-29: qty 237

## 2015-04-29 MED ORDER — PENTAFLUOROPROP-TETRAFLUOROETH EX AERO: 1.0000 "application " | INHALATION_SPRAY | CUTANEOUS | Status: DC | PRN

## 2015-04-29 MED ORDER — HEPARIN SODIUM (PORCINE) 1000 UNIT/ML DIALYSIS: 40.0000 [IU]/kg | Freq: Once | INTRAMUSCULAR | Status: DC

## 2015-04-29 MED ORDER — SODIUM CHLORIDE 0.9 % IV SOLN: 100.0000 mL | INTRAVENOUS | Status: DC | PRN

## 2015-04-29 MED ORDER — HEPARIN SODIUM (PORCINE) 1000 UNIT/ML DIALYSIS: 1000.0000 [IU] | INTRAMUSCULAR | Status: DC | PRN

## 2015-04-29 MED ORDER — ALTEPLASE 2 MG IJ SOLR
2.0000 mg | Freq: Once | INTRAMUSCULAR | Status: DC | PRN
  Filled 2015-04-29: qty 2

## 2015-04-29 MED ORDER — LIDOCAINE-PRILOCAINE 2.5-2.5 % EX CREA
1.0000 "application " | TOPICAL_CREAM | CUTANEOUS | Status: DC | PRN
  Filled 2015-04-29: qty 5

## 2015-04-29 NOTE — Progress Notes (Signed)
Subjective: Interval History: has no complaints, on HD  Objective: Vital signs in last 24 hours: Temp:  [97.4 F (36.3 C)-98.1 F (36.7 C)] 98 F (36.7 C) (06/13 0500) Pulse Rate:  [55-57] 57 (06/13 0500) BP: (144-162)/(72-87) 144/72 mmHg (06/13 0500) SpO2:  [94 %-97 %] 94 % (06/13 0500) Weight change:   Intake/Output from previous day: 06/12 0701 - 06/13 0700 In: 123 [P.O.:120; I.V.:3] Out: 50 [Urine:50] Intake/Output this shift:    General appearance: alert and cooperative Head: massive bruising R>L side Resp: diminished breath sounds bilaterally Cardio: S1, S2 normal, systolic murmur: holosystolic 2/6, blowing at apex and rate 40s -50s GI: soft, non-tender; bowel sounds normal; no masses,  no organomegaly Extremities: AVF LUA B&T +pitting pretib edema bilat LE's  Lab Results:  Recent Labs  04/26/15 1230 04/29/15 0320  WBC 5.7 6.7  HGB 10.3* 11.2*  HCT 30.6* 33.7*  PLT 116* 126*   BMET:   Recent Labs  04/27/15 1226 04/29/15 0320  NA 134* 132*  K 3.5 4.1  CL 96* 95*  CO2 26 24  GLUCOSE 153* 102*  BUN 22* 41*  CREATININE 4.08* 6.37*  CALCIUM 8.4* 9.1   No results for input(s): PTH in the last 72 hours. Iron Studies: No results for input(s): IRON, TIBC, TRANSFERRIN, FERRITIN in the last 72 hours.  Studies/Results: No results found.   General appearance: alert and cooperative Head: massive bruising R>L side Resp: diminished breath sounds bilaterally Cardio: S1, S2 normal, systolic murmur: holosystolic 2/6, blowing at apex and rate 40s -50s GI: soft, non-tender; bowel sounds normal; no masses,  no organomegaly Extremities: AVF LUA B&T +pitting pretib edema bilat LE's  HD: [facility] MWF 87kg  2/2.25 bath  Heparin std  LUA AVF  Assessment/Plan: 1 ESRD HD MWF, vol ok 2 Orthostasis tapering effexor off 3 Anemia stable,epo 4 HPTH vit D, sensipar 5 DM controlled 6 neuropathy 7 Falls  Recurrent issue ,not able to care for self in safe manner 8  Bradycardia 9 CAD poss PCI candidate in future  P HD, rehab, DM control   Kelly Splinter MD (pgr) [phone]    [phone] 04/29/2015, 9:35 AM

## 2015-04-29 NOTE — Progress Notes (Signed)
Order for SNF received.  CSW to speak with pt this am re: d/c planning.  Assessment to follow.

## 2015-04-29 NOTE — Progress Notes (Signed)
Lifevest video shown to patient. Understanding uncertain. Awaiting family to replay video. Claiborne Billings, [employer] rep called in. Made aware of D/ planning for tomorrow, per CM. [facility] will arrive tomorrow at 1000 to deliver [place].

## 2015-04-29 NOTE — Progress Notes (Signed)
Occupational Therapy Treatment Patient Details Name: [name] [name] MRN: [id number] DOB: 1951-01-19 Today's Date: 04/29/2015    History of present illness Pt is a 64 y.o. Female admitted 04/22/15 with syncopal episode resulting in fall with facial injuries and eye lid hematoma. CT head/neck negative for acute fx or cranial hemorrhage. PMH ESRD (HD on MWF, s/p R- nephrectomy), DM2, OA, memory loss, GERD, depression. Pt with 2 recent falls in pst 6 months.  Pt s/p recent Left Heart Cath and Coronary Angiography.   OT comments  Pt. Agreeable to participation in skilled OT.  Continues with inability for completion of tasks with out interruption due to high distractibility mostly from herself.  Limited today secondary to reports of L visual changes.  RN made aware.  Will continue to follow acutely.  Strongly agree with recommendations for SNF due to multiple safety risk factors for this pt.  Follow Up Recommendations  SNF;Supervision/Assistance - 24 hour    Equipment Recommendations  None recommended by OT    Recommendations for Other Services      Precautions / Restrictions Precautions Precautions: Fall       Mobility Bed Mobility               General bed mobility comments: Supervision needed for safety. HOB elevated and pt with use of bed rails.   Transfers Overall transfer level: Needs assistance Equipment used: Quad cane Transfers: Sit to/from [employer] to Stand: Min assist Stand pivot transfers: Min assist       General transfer comment: Min A to power up and ensure stabilty with standing. Cues for safe technique    Balance     Sitting balance-Leahy Scale: Good       Standing balance-Leahy Scale: Poor                     ADL Overall ADL's : Needs assistance/impaired Eating/Feeding: Minimal assistance;Sitting Eating/Feeding Details (indicate cue type and reason): difficulty opening containers this a.m. secondary to c/o blurred  vision in L eye and R eye is swollen shut from recent injury Grooming: Wash/dry hands;Wash/dry face;Minimal assistance;Cueing for sequencing Grooming Details (indicate cue type and reason): pt. requested wash cloth for face but then began to wash arms, easily distracted by her own thoughts and needs                 Toilet Transfer: Minimal assistance;Stand-pivot;Cueing for sequencing Toilet Transfer Details (indicate cue type and reason): stand pivot eob to recliner, cues for hand placement on arm rests  Toileting- Clothing Manipulation and Hygiene: Minimal assistance;Sit to/from stand Toileting - Clothing Manipulation Details (indicate cue type and reason): simulated during transfer     Functional mobility during ADLs: Cane;Min guard General ADL Comments: pt. struggles with distractions of her own thoughts as she had tendancy to name multiple things she wanted to do and would stop the task she was currently working on.  c/o increased pain with L middle finger with noted edema, also visual deficits L eye "blurry" states worse today than yesterday.  rn made aware      Vision                 Additional Comments: pt. states "i could see the clock yesterday and today I can't" states "i feel like i got something in this eye its blurry" (referring to L eye, R eye is swollen shut) r.n. notified of pt. report of vision changes.  Will likely need further vision  assessment    Perception     Praxis      Cognition   Behavior During Therapy: WFL for tasks assessed/performed Overall Cognitive Status: No family/caregiver present to determine baseline cognitive functioning       Memory: Decreased short-term memory               Extremity/Trunk Assessment               Exercises     Shoulder Instructions       General Comments      Pertinent Vitals/ Pain       Pain Assessment:  (did not rate but c/o finger pain)  Home Living                                           Prior Functioning/Environment              Frequency Min 2X/week     Progress Toward Goals  OT Goals(current goals can now be found in the care plan section)  Progress towards OT goals: Progressing toward goals     Plan Discharge plan remains appropriate    Co-evaluation                 End of Session Equipment Utilized During Treatment: Gait belt;Other (comment) (quad cane)   Activity Tolerance Patient tolerated treatment well   Patient Left in chair;with call bell/phone within reach   Nurse Communication Other (comment) (rn notified of vision changes and finger pain)        Time: 8891-6945 OT Time Calculation (min): 20 min  Charges: OT General Charges $OT Visit: 1 Procedure OT Treatments $Self Care/Home Management : 8-22 mins  Janice Coffin, COTA/L 04/29/2015, 8:04 AM

## 2015-04-29 NOTE — Progress Notes (Signed)
Family Medicine Teaching Service Daily Progress Note Intern Pager: [phone]  Patient name: [name] [name] Medical record number: [id number] Date of birth: 01/14/1951 Age: 64 y.o. Gender: female  Primary Care Provider: Tommi Rumps, MD Consultants: none Code Status: Full  Pt Overview and Major Events to Date:  06/07: admitted 06/09: Heart cath performed.  Assessment and Plan: [name] [name] is a 64 y.o. female presenting with syncopal episode w/ associated facial trauma. PMH is significant for ESRD (on HD MWF, s/p R-nephrectomy), DM2, OA knees, memory loss (fam h/o alzheimer's), GERD, depression.  # Syncopal episode with LOC, unwitnessed without residual symptoms - Improved Repeat EKG with nonspecific t wave abnormality.  Trop 0.05>0.04>0.06.  TSH 1.974. BNP 3990.5 (could be increased 2/2 ESRD) Repeat ECHO EF 35% Severe hypokinesis of inferior wall and the septum (new).  Wall thickness was increased in a pattern of mild LVH PA peak pressure: 44 mm Hg - telemetry - Orthostatics were positive - PT/OT eval: recommendations for SNF - Case Management for HH needs - Patient not tolerating compression stockings. Reinforce changing positions throughout day. - c/s cardiology  - Heart cath: severe multivessel dz and inferior wall hypokinesis EF 45%.  - Discontinued Coreg 3.125mg  per Nephrology (bradycardia)  - Imdur 15mg  added 6/10  - No driving x6 months  - lifevest prior to DC; EP has seen her  - c/s CTS: No CABG for now b/c RF, mobility.  Pt high risk for anticoag in the setting of recent trauma.  Pt should recover then PCI then maybe plavix outpatient.   # ESRD, non-oliguric, on HD (M-W-F) Followed by [facility], s/p R-nephrectomy. Good compliance w/ HD - Access LUE-AVF - Renal diet, fluid restrict, Calcium carbonate - Monitor I/O, BPs, labs, volume status - c/s renal: appreciate recs  # DM2, controlled  Last A1c 6.1 (05/2014). On Lantus 3u qhs at home. - Hold Lantus  currently, consider resuming - Sensitive SSI, check CBG q AC hs  # OA Knees, bilateral, chronic - Currently controlled, without any pain  # Memory loss, chronic, stable - Family h/o Alzheimer's dementia. Prev work-up for reversible etiologies neg. MMSE 27/30 on 07/12/14  # GERD - Continue home PPI  # Lower ext superficial blister, ?cellulitis  Afebrile, no WBCs - Recently treated outpatient HD with Doxy and Bactrim for superficial skin ulceration, not impressive on exam and do not see significant findings. - Hold antibiotics at this time  # H/o Depression - Titrate down Effexor-XR 75mg  >> 37.5mg  QD currently  FEN/GI: SLIV. Renal/carb mod diet w/ fluid restriction.  Prophylaxis: Heparin SQ  Disposition: DC to SNF most likely tomorrow after obtaining Lifevest  Subjective:  Patient reports no problems overnight. She is eager to be discharged to SNF.  She denies CP, SOB, abdominal pain, N/V.  Blurry vision on L eye has resolved.    Objective: Temp:  [97.4 F (36.3 C)-98.2 F (36.8 C)] 97.4 F (36.3 C) (06/13 1436) Pulse Rate:  [53-58] 58 (06/13 1436) Resp:  [15-22] 18 (06/13 1436) BP: (107-150)/(60-86) 133/61 mmHg (06/13 1436) SpO2:  [94 %-98 %] 96 % (06/13 1436) Weight:  [195 lb 1.7 oz (88.5 kg)-201 lb 11.5 oz (91.5 kg)] 195 lb 1.7 oz (88.5 kg) (06/13 1318) Physical Exam: General: awake, no distress, getting ready for HD HEENT: Extensive ecchymosis of her face, R>L, R eye edema and ecchymosis, inferior aspect of L eye with ecchymosis, +TTP diffusely (improving), patient able to open R eye. EOMI.   Cardiovascular: RRR, no murmurs Respiratory: CTAB,  no increased WOB Abdomen: soft, NT/ND, +BS Extremities: WWP, LUE with fistula w/ palpable thrill, mild pretibial edema with tenderness. No erythema  Neuro: no focal deficits, follows commands  Laboratory:  Recent Labs Lab 04/26/15 0305 04/26/15 1230 04/29/15 0320  WBC 6.7 5.7 6.7  HGB 11.0* 10.3* 11.2*  HCT 32.8* 30.6*  33.7*  PLT 115* 116* 126*     Recent Labs Lab 04/26/15 0305 04/26/15 1230 04/27/15 1226 04/29/15 0320  NA 134* 130* 134* 132*  K 4.1 3.7 3.5 4.1  CL 97* 94* 96* 95*  CO2 25 23 26 24   BUN 36* 39* 22* 41*  CREATININE 5.49* 5.83* 4.08* 6.37*  CALCIUM 9.0 8.6* 8.4* 9.1  PROT 6.1*  --   --   --   BILITOT 1.2  --   --   --   ALKPHOS 112  --   --   --   ALT 19  --   --   --   AST 28  --   --   --   GLUCOSE 121* 146* 153* 102*    Imaging/Diagnostic Tests: No results found.    Janora Norlander, DO 04/29/2015, 3:09 PM PGY-1, [place] Intern pager: [phone], text pages welcome

## 2015-04-29 NOTE — Progress Notes (Signed)
Reviewed notes, pt seemingly not appropriate for Cardiac Rehab at this time (no definite plans for CABG, not MI, not ambulating). Pt more appropriate for PT/OT. Will not follow. Yves Dill CES, ACSM 8:12 AM 04/29/2015

## 2015-04-29 NOTE — Progress Notes (Signed)
Patient Name: [name] [name] Date of Encounter: 04/29/2015  Active Problems:   Diabetes mellitus type 2 in nonobese   End stage renal disease   Loss of consciousness   Syncope   Fall   Facial hematoma   CAD in native artery  SUBJECTIVE  Pt seen in dialysis center.  Denies cp, sob or palpitation. Feels sleepy.   CURRENT MEDS . aspirin EC  81 mg Oral Daily  . calcium carbonate  1 tablet Oral TID WC  . heparin  40 Units/kg Dialysis Once in dialysis  . heparin  5,000 Units Subcutaneous 3 times per day  . insulin aspart  0-9 Units Subcutaneous TID WC  . isosorbide mononitrate  15 mg Oral Daily  . multivitamin  1 tablet Oral QHS  . pantoprazole  40 mg Oral Daily  . sodium chloride  3 mL Intravenous Q12H  . venlafaxine XR  37.5 mg Oral Q breakfast    OBJECTIVE  Filed Vitals:   04/28/15 0533 04/28/15 1422 04/28/15 2010 04/29/15 0500  BP: 154/84 162/87 146/86 144/72  Pulse: 66 56 55 57  Temp: 98.7 F (37.1 C) 98.1 F (36.7 C) 97.4 F (36.3 C) 98 F (36.7 C)  TempSrc: Oral Oral Oral Oral  Resp: 18     Height:      Weight: 196 lb 12.8 oz (89.268 kg)     SpO2: 91% 96% 97% 94%    Intake/Output Summary (Last 24 hours) at 04/29/15 0922 Last data filed at 04/29/15 0300  Gross per 24 hour  Intake      3 ml  Output     50 ml  Net    -47 ml   Filed Weights   04/26/15 1400 04/26/15 2017 04/28/15 0533  Weight: 193 lb 9 oz (87.8 kg) 194 lb 3.6 oz (88.1 kg) 196 lb 12.8 oz (89.268 kg)    PHYSICAL EXAM  General: Pleasant, NAD. Neuro: Alert and oriented X 3. Moves all extremities spontaneously. Psych: Normal affect. HEENT:  Bruise on face R>L. Swelling on R orbital area.   Neck: No  JVD. Lungs:  Resp regular and unlabored. Diminished breath sound bibasilar.  Heart: RRR no s3, s4, or murmurs. Abdomen: Soft, non-tender, non-distended, BS + x 4.  Extremities: No clubbing, cyanosis or edema. DP/PT/Radials 2+ and equal bilaterally. Fistula LUE with good thrill   Accessory  Clinical Findings  CBC  Recent Labs  04/26/15 1230 04/29/15 0320  WBC 5.7 6.7  HGB 10.3* 11.2*  HCT 30.6* 33.7*  MCV 100.7* 100.9*  PLT 116* 128*   Basic Metabolic Panel  Recent Labs  04/26/15 1230 04/27/15 1226 04/29/15 0320  NA 130* 134* 132*  K 3.7 3.5 4.1  CL 94* 96* 95*  CO2 23 26 24   GLUCOSE 146* 153* 102*  BUN 39* 22* 41*  CREATININE 5.83* 4.08* 6.37*  CALCIUM 8.6* 8.4* 9.1  PHOS 4.2  --  4.2   Liver Function Tests  Recent Labs  04/26/15 1230 04/29/15 0320  ALBUMIN 3.1* 3.2*      Echo 04/24/15 LV EF: 35%  ------------------------------------------------------------------- Indications:   Syncope 780.2.  ------------------------------------------------------------------- History:  PMH: Fatigue. Muscle spasms. Risk factors: Diabetes mellitus.  ------------------------------------------------------------------- Study Conclusions  - Left ventricle: Wall motion abnormalities are new since study of 2014. Severe hypokinesis of inferior wall and the septum. The cavity size was normal. Wall thickness was increased in a pattern of mild LVH. The estimated ejection fraction was 35%. - Aortic valve: Sclerosis without stenosis. There  was no significant regurgitation. - Left atrium: The atrium was moderately dilated. - Right ventricle: The cavity size was mildly dilated. Systolic function was moderately reduced. - Tricuspid valve: There was mild-moderate regurgitation. - Pulmonary arteries: PA peak pressure: 44 mm Hg (S).  Cath 04/25/15 Conclusion    1. Dist Cx lesion, 65% stenosed. 2. Prox Cx to Mid Cx lesion, 95% stenosed. 3. RPDA lesion, 95% stenosed. 4. Prox RCA to Dist RCA lesion, 60% stenosed. 5. Ost 1st Diag to 1st Diag lesion, 70% stenosed. 6. 2nd Diag lesion, 90% stenosed. 7. Mid LAD to Dist LAD lesion, 85% stenosed. 8. Ost LM to LM lesion, 50% stenosed.   Severe multivessel coronary disease with diffuse significant  segmental LAD stenosis, 95% stenosis in a large second diagonal, 75% ostial stenosis in the smaller first diagonal, 95% mid and 65% distal circumflex, 95% ostial/proximal PDA, and 40-50% left main ostial disease.  Inferior wall hypokinesis with EF of 45%  RECOMMENDATIONS:   TCTS consult to consider coronary bypass grafting once patient is over the recent severe head trauma related to syncope  Long-term monitoring to rule out ischemic arrhythmias either bradycardia or tachycardia.  Dialysis has per nephrology.      ASSESSMENT AND PLAN  [name] [name] is a 64 y.o. female presenting with syncopal episode w/ associated facial trauma. PMH is significant for ESRD (on HD MWF, s/p R-nephrectomy), DM2, OA knees, memory loss (fam h/o alzheimer's), GERD, depression.  1. Syncope - Unclear etiology. Newly diagnosed cardiomyopathy and CAD.  - Cath severe multivessel dz and inferior wall hypokinesis EF 45%. No CABG per CTS. Not on DAPT due to recent trauma.  - LifeVest for now.  - Plan to f/u with Dr. Tamala Julian and Dr. Brynda Peon as outpatient. Possible staged PCI  vs ICD vs medical management once recover from acute injuries.  - Continue  ASA, imdur - Discontinued coreg per nephrology due to bradycardia. Consider adding as BP permits or Ok with nephrology.  .  2. CAD - Presented with syncope.  - As above - Unable to participate in cardiac rehab. Encouragement given.   3. CRF - per nephrology  4. DM - per perimary  Dispo: plan to discharge SNF once LifeVest obtained per IM.    Jarrett Soho PA-C Pager [phone]    Patient seen and examined. Agree with assessment and plan. Getting dialysis. No recurrent chest pain.  Troy Sine, MD, [facility] 04/29/2015 12:09 PM

## 2015-04-29 NOTE — Progress Notes (Signed)
Attending MD paged at [phone]; made aware of patient report of vision changes in L eye, Blurry, and also L middle middle finger pain. States will assess during rounding.

## 2015-04-29 NOTE — Care Management Note (Signed)
Case Management Note  Patient Details  Name: [name] [name] MRN: [id number] Date of Birth: 1951-06-11  Subjective/Objective:           CAD, ESRD         Action/Plan: SNF  Expected Discharge Date:       04/30/2015           Expected Discharge Plan:  [place]  In-House Referral:  Clinical Social Work  Discharge planning Services  CM Consult   Status of Service:  Completed, signed off  Medicare Important Message Given:  Yes Date Medicare IM Given:  04/22/15 Medicare IM give by:  Jonnie Finner RN CCM  Date Additional Medicare IM Given:    Additional Medicare Important Message give by:     If discussed at [place] of Stay Meetings, dates discussed:    Additional Comments: Consulted for [employer]. Spoke to [employer], Nadean Corwin. Will arrange delivery of Lifevest 04/29/2015. NCM made CSW aware. CSW following for SNF placement. Planned dc 04/30/2015 to SNF.   Erenest Rasher, RN 04/29/2015, 3:14 PM

## 2015-04-30 ENCOUNTER — Other Ambulatory Visit: Payer: Self-pay | Admitting: Cardiology

## 2015-04-30 ENCOUNTER — Telehealth: Payer: Self-pay | Admitting: Physician Assistant

## 2015-04-30 ENCOUNTER — Encounter (HOSPITAL_COMMUNITY): Payer: BLUE CROSS/BLUE SHIELD

## 2015-04-30 DIAGNOSIS — W19XXXD Unspecified fall, subsequent encounter: Secondary | ICD-10-CM

## 2015-04-30 DIAGNOSIS — R001 Bradycardia, unspecified: Secondary | ICD-10-CM

## 2015-04-30 DIAGNOSIS — R55 Syncope and collapse: Secondary | ICD-10-CM

## 2015-04-30 LAB — GLUCOSE, CAPILLARY
GLUCOSE-CAPILLARY: 106 mg/dL — AB (ref 65–99)
GLUCOSE-CAPILLARY: 98 mg/dL (ref 65–99)
Glucose-Capillary: 167 mg/dL — ABNORMAL HIGH (ref 65–99)

## 2015-04-30 MED ORDER — VENLAFAXINE HCL ER 37.5 MG PO CP24: 37.5000 mg | ORAL_CAPSULE | Freq: Every day | ORAL | Status: DC

## 2015-04-30 NOTE — Discharge Planning (Signed)
Patient to be discharged to [facility]. Patient and patient's husband updated regarding discharge.  Blue Medicare: [id number] Next review date: 05/02/15 RUG level: RVB  Facility: [facility] RN report number: [phone] Transportation: EMS ([address]  Lubertha Sayres, [place] [phone]) and Surgical [phone])

## 2015-04-30 NOTE — Progress Notes (Signed)
  [facility] Progress Note   Subjective: NO new complaints, getting fitted with life vest  Filed Vitals:   04/29/15 1318 04/29/15 1436 04/29/15 2100 04/30/15 0500  BP: 150/62 133/61 139/71 146/73  Pulse: 55 58 56 57  Temp: 98.2 F (36.8 C) 97.4 F (36.3 C) 98 F (36.7 C) 98.9 F (37.2 C)  TempSrc: Oral Oral Oral Oral  Resp: 16 18    Height:      Weight: 88.5 kg (195 lb 1.7 oz)     SpO2: 98% 96% 95% 93%   Exam: General appearance: alert and cooperative Head: massive bruising R>L side Resp: diminished breath sounds bilaterally Cardio: S1, S2 normal, systolic murmur: holosystolic 2/6, blowing at apex and rate 40s -50s GI: soft, non-tender; bowel sounds normal; no masses, no organomegaly Extremities: AVF LUA B&T +pitting pretib edema bilat LE's  HD: [facility] MWF 87kg 2/2.25 bath Heparin std LUA AVF  Assessment/Plan: 1 ESRD HD MWF, vol better and close to dry wt 2 Orthostasis tapering effexor off 3 Anemia stable,epo 4 HPTH vit D, sensipar 5 DM controlled 6 neuropathy 7 Falls Recurrent issue ,not able to care for self in safe manner 8 Bradycardia 9 CAD poss PCI candidate in future  P HD, rehab, DM control, vol control, tight hep with HD for 1-2 wks  Plan - HD in am    Kelly Splinter MD  pager [phone]    cell [phone]  04/30/2015, 11:49 AM     Recent Labs Lab 04/25/15 1230  04/26/15 1230 04/27/15 1226 04/29/15 0320  NA 133*  < > 130* 134* 132*  K 3.9  < > 3.7 3.5 4.1  CL 95*  < > 94* 96* 95*  CO2 26  < > 23 26 24   GLUCOSE 91  < > 146* 153* 102*  BUN 27*  < > 39* 22* 41*  CREATININE 4.02*  < > 5.83* 4.08* 6.37*  CALCIUM 8.3*  < > 8.6* 8.4* 9.1  PHOS 3.5  --  4.2  --  4.2  < > = values in this interval not displayed.  Recent Labs Lab 04/26/15 0305 04/26/15 1230 04/29/15 0320  AST 28  --   --   ALT 19  --   --   ALKPHOS 112  --   --   BILITOT 1.2  --   --   PROT 6.1*  --   --   ALBUMIN 3.2* 3.1* 3.2*    Recent Labs Lab  04/26/15 0305 04/26/15 1230 04/29/15 0320  WBC 6.7 5.7 6.7  HGB 11.0* 10.3* 11.2*  HCT 32.8* 30.6* 33.7*  MCV 100.6* 100.7* 100.9*  PLT 115* 116* 126*   . aspirin EC  81 mg Oral Daily  . calcium carbonate  1 tablet Oral TID WC  . heparin  5,000 Units Subcutaneous 3 times per day  . insulin aspart  0-9 Units Subcutaneous TID WC  . isosorbide mononitrate  15 mg Oral Daily  . multivitamin  1 tablet Oral QHS  . pantoprazole  40 mg Oral Daily  . sodium chloride  3 mL Intravenous Q12H  . venlafaxine XR  37.5 mg Oral Q breakfast     acetaminophen **OR** acetaminophen, docusate sodium

## 2015-04-30 NOTE — Discharge Instructions (Signed)
You should plan to follow up with your PCP once discharged from rehabilitation.   Fall Prevention and Home Safety Falls cause injuries and can affect all age groups. It is possible to use preventive measures to significantly decrease the likelihood of falls. There are many simple measures which can make your home safer and prevent falls. OUTDOORS  Repair cracks and edges of walkways and driveways.  Remove high doorway thresholds.  Trim shrubbery on the main path into your home.  Have good outside lighting.  Clear walkways of tools, rocks, debris, and clutter.  Check that handrails are not broken and are securely fastened. Both sides of steps should have handrails.  Have leaves, snow, and ice cleared regularly.  Use sand or salt on walkways during winter months.  In the garage, clean up grease or oil spills. BATHROOM  Install night lights.  Install grab bars by the toilet and in the tub and shower.  Use non-skid mats or decals in the tub or shower.  Place a plastic non-slip stool in the shower to sit on, if needed.  Keep floors dry and clean up all water on the floor immediately.  Remove soap buildup in the tub or shower on a regular basis.  Secure bath mats with non-slip, double-sided rug tape.  Remove throw rugs and tripping hazards from the floors. BEDROOMS  Install night lights.  Make sure a bedside light is easy to reach.  Do not use oversized bedding.  Keep a telephone by your bedside.  Have a firm chair with side arms to use for getting dressed.  Remove throw rugs and tripping hazards from the floor. KITCHEN  Keep handles on pots and pans turned toward the center of the stove. Use back burners when possible.  Clean up spills quickly and allow time for drying.  Avoid walking on wet floors.  Avoid hot utensils and knives.  Position shelves so they are not too high or low.  Place commonly used objects within easy reach.  If necessary, use a  sturdy step stool with a grab bar when reaching.  Keep electrical cables out of the way.  Do not use floor polish or wax that makes floors slippery. If you must use wax, use non-skid floor wax.  Remove throw rugs and tripping hazards from the floor. STAIRWAYS  Never leave objects on stairs.  Place handrails on both sides of stairways and use them. Fix any loose handrails. Make sure handrails on both sides of the stairways are as long as the stairs.  Check carpeting to make sure it is firmly attached along stairs. Make repairs to worn or loose carpet promptly.  Avoid placing throw rugs at the top or bottom of stairways, or properly secure the rug with carpet tape to prevent slippage. Get rid of throw rugs, if possible.  Have an electrician put in a light switch at the top and bottom of the stairs. OTHER FALL PREVENTION TIPS  Wear low-heel or rubber-soled shoes that are supportive and fit well. Wear closed toe shoes.  When using a stepladder, make sure it is fully opened and both spreaders are firmly locked. Do not climb a closed stepladder.  Add color or contrast paint or tape to grab bars and handrails in your home. Place contrasting color strips on first and last steps.  Learn and use mobility aids as needed. Install an electrical emergency response system.  Turn on lights to avoid dark areas. Replace light bulbs that burn out immediately. Get light switches  that glow.  Arrange furniture to create clear pathways. Keep furniture in the same place.  Firmly attach carpet with non-skid or double-sided tape.  Eliminate uneven floor surfaces.  Select a carpet pattern that does not visually hide the edge of steps.  Be aware of all pets. OTHER HOME SAFETY TIPS  Set the water temperature for 120 F (48.8 C).  Keep emergency numbers on or near the telephone.  Keep smoke detectors on every level of the home and near sleeping areas. Document Released: 10/23/2002 Document Revised:  05/03/2012 Document Reviewed: 01/22/2012 [facility] Patient Information 2015 [place], [place]. This information is not intended to replace advice given to you by your health care provider. Make sure you discuss any questions you have with your health care provider.  You should wear life vest as instructed  You will need a monitor to wear as well to help determine meds.  Our office will call [place] to tell you appointment date and time.

## 2015-04-30 NOTE — Clinical Social Work Placement (Signed)
   CLINICAL SOCIAL WORK PLACEMENT  NOTE  Date:  04/30/2015  Patient Details  Name: [name] [name] MRN: [id number] Date of Birth: 1951-04-30  Clinical Social Work is seeking post-discharge placement for this patient at the [place] level of care (*CSW will initial, date and re-position this form in  chart as items are completed):  Yes   Patient/family provided with [facility] Work Department's list of facilities offering this level of care within the geographic area requested by the patient (or if unable, by the patient's family).  Yes   Patient/family informed of their freedom to choose among providers that offer the needed level of care, that participate in Medicare, Medicaid or managed care program needed by the patient, have an available bed and are willing to accept the patient.  Yes   Patient/family informed of [facility]'s ownership interest in [facility] and [facility], as well as of the fact that they are under no obligation to receive care at these facilities.  PASRR submitted to EDS on 04/30/15     PASRR number received on 04/30/15     Existing PASRR number confirmed on  (n/a)     FL2 transmitted to all facilities in geographic area requested by pt/family on  (n/a)     FL2 transmitted to all facilities within larger geographic area on 04/30/15     Patient informed that his/her managed care company has contracts with or will negotiate with certain facilities, including the following:   (n/a)     Yes   Patient/family informed of bed offers received.  Patient chooses bed at [facility]     Physician recommends and patient chooses bed at  (n/a)    Patient to be transferred to [facility] on 04/30/15.  Patient to be transferred to facility by [facility]     Patient family notified on 04/30/15 of transfer.  Name of family member notified:  Patient and patient's husband updated.     PHYSICIAN       Additional Comment:     _______________________________________________ Caroline Sauger, LCSW 04/30/2015, 3:19 PM [phone]

## 2015-04-30 NOTE — Care Management Note (Signed)
Case Management Note  Patient Details  Name: [name] [name] MRN: [id number] Date of Birth: Jul 10, 1951  Subjective/Objective: Pt was fit for life vest and plan to d/c to [facility].                    Action/Plan: CSW assisting with disposition needs.    Expected Discharge Date:                  Expected Discharge Plan:  [place]  In-House Referral:  Clinical Social Work  Discharge planning Services  CM Consult  Post Acute Care Choice:    Choice offered to:     DME Arranged:  3-N-1, Vest life vest DME Agency:   ([employer] life vest. )  HH Arranged:    HH Agency:     Status of Service:  Completed, signed off  Medicare Important Message Given:  Yes Date Medicare IM Given:  04/22/15 Medicare IM give by:  Jonnie Finner RN CCM  Date Additional Medicare IM Given:  04/30/15 Additional Medicare Important Message give by:  Jacqlyn Krauss, RN,BSN  If discussed at Long Length of Stay Meetings, dates discussed:    Additional Comments:  Bethena Roys, RN 04/30/2015, 4:52 PM

## 2015-04-30 NOTE — Progress Notes (Signed)
Subjective: No chest pain, no SOB, lifevest in the room.  Objective: Vital signs in last 24 hours: Temp:  [97.4 F (36.3 C)-98.9 F (37.2 C)] 98.9 F (37.2 C) (06/14 0500) Pulse Rate:  [55-58] 57 (06/14 0500) Resp:  [16-18] 18 (06/13 1436) BP: (133-150)/(61-73) 146/73 mmHg (06/14 0500) SpO2:  [93 %-98 %] 93 % (06/14 0500) Weight:  [195 lb 1.7 oz (88.5 kg)] 195 lb 1.7 oz (88.5 kg) (06/13 1318) Weight change:  Last BM Date: 04/29/15 Intake/Output from previous day: 06/13 0701 - 06/14 0700 In: -  Out: 3050 [Urine:50] Intake/Output this shift: Total I/O In: 240 [P.O.:240] Out: -   PE: General:Pleasant affect, NAD Skin:Warm and dry, brisk capillary refill HEENT:normocephalic, sclera clear, mucus membranes moist, severe ecchymosis of face/neck and hematoma above rt eye.   Heart:S1S2 RRR without murmur, gallup, rub or click Lungs:clear without rales, rhonchi, or wheezes XKG:YJEH, non tender, + BS, do not palpate liver spleen or masses Ext:no lower ext edema, 2+ pedal pulses, 2+ radial pulses Neuro:alert and oriented, MAE, follows commands, + facial symmetry Tele: SR    Lab Results:  Recent Labs  04/29/15 0320  WBC 6.7  HGB 11.2*  HCT 33.7*  PLT 126*   BMET  Recent Labs  04/29/15 0320  NA 132*  K 4.1  CL 95*  CO2 24  GLUCOSE 102*  BUN 41*  CREATININE 6.37*  CALCIUM 9.1   No results for input(s): TROPONINI in the last 72 hours.  Invalid input(s): CK, MB  Lab Results  Component Value Date   CHOL 191 06/12/2014   HDL 51 06/12/2014   LDLCALC 117* 06/12/2014   TRIG 115 06/12/2014   CHOLHDL 3.7 06/12/2014   Lab Results  Component Value Date   HGBA1C 6.1 06/12/2014     Lab Results  Component Value Date   TSH 1.974 04/25/2015    Hepatic Function Panel  Recent Labs  04/29/15 0320  ALBUMIN 3.2*   No results for input(s): CHOL in the last 72 hours. No results for input(s): PROTIME in the last 72 hours.     Studies/Results: No  results found.  Medications: I have reviewed the patient's current medications. Scheduled Meds: . aspirin EC  81 mg Oral Daily  . calcium carbonate  1 tablet Oral TID WC  . heparin  5,000 Units Subcutaneous 3 times per day  . insulin aspart  0-9 Units Subcutaneous TID WC  . isosorbide mononitrate  15 mg Oral Daily  . multivitamin  1 tablet Oral QHS  . pantoprazole  40 mg Oral Daily  . sodium chloride  3 mL Intravenous Q12H  . venlafaxine XR  37.5 mg Oral Q breakfast   Continuous Infusions:  PRN Meds:.acetaminophen **OR** acetaminophen, docusate sodium  Assessment/Plan: [name] [name] is a 64 y.o. female presenting with syncopal episode w/ associated facial trauma. PMH is significant for ESRD (on HD MWF, s/p R-nephrectomy), DM2, OA knees, memory loss (fam h/o alzheimer's), GERD, depression.  1. Syncope - Unclear etiology. Newly diagnosed cardiomyopathy and CAD.  - Cath severe multivessel dz and inferior wall hypokinesis EF 45%. No CABG at this time due to injuries, will need re-eval once recovered.  Per Dr. Prescott Gum- "the patient would be a poor candidate for CABG because of her renal failure, fragile condition, poor mobility, venous stasis of the lower extremities with probable poor conduit, and currently very high risk of high-dose anticoagulation worsening her soft tissue injuries to the head and  neck and face. I would recommend medical therapy currently, allowing her to recover from the trauma to the head and neck area and then reevaluation for PCI which would allow time for recovery before she would be placed on Plavix. I will also be willing to reevaluate her for CABG as an outpatient in the future when she is recovered from her acute injuries."  Not on DAPT due to recent trauma.  - LifeVest for now.  - Plan to f/u with Dr. Tamala Julian and Dr. Brynda Peon as outpatient. Possible staged PCI vs ICD vs CABG medical management once recover from acute injuries.  - Continue ASA, imdur -  Discontinued coreg per nephrology due to bradycardia. Consider adding as BP permits or Ok with nephrology. will need event monitor we will arrange as outpt. .  2. CAD - Presented with syncope.  - As above - Unable to participate in cardiac rehab. Encouragement given.   3. CRF - per nephrology  4. DM - per perimary  Dispo: plan to discharge SNF once LifeVest obtained per IM. And will have event monitor placed.     LOS: 6 days   Time spent with pt. :15 minutes. [facility]  Nurse Practitioner Certified Pager [phone] or after 5pm and on weekends call [phone] 04/30/2015, 1:05 PM    Patient seen and examined. Agree with assessment and plan. For transfer to [facility] today. Will place outpatient 30 day Cardionet monitor from office. Will need re-assessment for revascularization once trauma lesions resolved with outpatient evaluation.   Troy Sine, MD, [facility] 04/30/2015 3:12 PM

## 2015-04-30 NOTE — Clinical Social Work Note (Addendum)
CSW provided bed offers to patient and patient's husband.  Patient chose [facility] and CSW confirmed that SNF is able to accommodate the patient's life vest and dialysis.    Transportation: [facility]   PT last evaluated patient on 6/10.  PT will need to treat patient and provide updated documentation per insurance requirements [facility]) prior to insurance review.  CSW requested stat PT treat.  PT states an order would need to be placed by MD in order for PT to "sign back on".  OT treated patient on 6/13- insurance requirements satisfied.    Nonnie Done, LCSW [phone]  Psychiatric & Orthopedics (5N 1-8) Clinical Social Worker

## 2015-04-30 NOTE — Progress Notes (Signed)
Physical Therapy Treatment Patient Details Name: [name] [name] MRN: [id number] DOB: 1950-11-26 Today's Date: 04/30/2015    History of Present Illness Pt is a 64 y.o. Female admitted 04/22/15 with syncopal episode resulting in fall with facial injuries and eye lid hematoma. CT head/neck negative for acute fx or cranial hemorrhage. PMH ESRD (HD on MWF, s/p R- nephrectomy), DM2, OA, memory loss, GERD, depression. Pt with 2 recent falls in past 6 months.      PT Comments    Pt with continued AMS and required cues to maintain proper/safe sequencing as trying to dress her lower body (she was attempting this task alone on PT's arrival, sitting at EOB). After completion, continues to need cues for safe use of RW and assist for balance when ambulating. Agree pt needs 24 hour care with current deficits (cognitive and physical).    Follow Up Recommendations  SNF;Supervision/Assistance - 24 hour     Equipment Recommendations  None recommended by PT    Recommendations for Other Services       Precautions / Restrictions Precautions Precautions: Fall Restrictions Weight Bearing Restrictions: No    Mobility  Bed Mobility                  Transfers Overall transfer level: Needs assistance Equipment used: Rolling walker (2 wheeled) Transfers: Sit to/from [facility] Sit to Stand: Min guard Stand pivot transfers: Min assist       General transfer comment: Sit to stand required cues for sequencing and close guarding. Pivoting to [facility], she pushed RW aside and used armrest with min assist for balance  Ambulation/Gait Ambulation/Gait assistance: Min assist Ambulation Distance (Feet): 25 Feet Assistive device: Rolling walker (2 wheeled)     Gait velocity interpretation: Below normal speed for age/gender General Gait Details: loss of balance/stagger to Rt x 1 required assist to recover; assist to make sharp 180 turn with RW in her room;    Stairs             Wheelchair Mobility    Modified Rankin (Stroke Patients Only)       Balance     Sitting balance-Leahy Scale: Good       Standing balance-Leahy Scale: Fair                 High Level Balance Comments: Recommend pt use RW at home as she is steadier when holding on to somethinkg with both hands    Cognition Arousal/Alertness: Awake/alert Behavior During Therapy: WFL for tasks assessed/performed Overall Cognitive Status: No family/caregiver present to determine baseline cognitive functioning Area of Impairment: Attention;Memory;Safety/judgement;Problem solving   Current Attention Level: Sustained     Safety/Judgement: Decreased awareness of safety   Problem Solving: Difficulty sequencing;Requires verbal cues;Requires tactile cues General Comments: pt at EOB with clothes beside her trying to get dressed. She was able to identify to start with her underwear, however she became distracted x 5 starting other tasks before completing donning underwear over feet and up to her knees. Next wanted to put on her shoes (before pants), however when questioned if that was correct, she was able to correct the sequencing. Assisted with donning pants over her feet to her knees as she could not bend over far enough in sitting to get to her feet (fear of falling forward).     Exercises      General Comments        Pertinent Vitals/Pain Pain Assessment: No/denies pain    Home Living  Prior Function            PT Goals (current goals can now be found in the care plan section) Acute Rehab PT Goals Patient Stated Goal: to return home soon. Pt worried about her sister and husband Time For Goal Achievement: 05/01/15 Progress towards PT goals: Progressing toward goals    Frequency  Min 3X/week    PT Plan Current plan remains appropriate    Co-evaluation             End of Session Equipment Utilized During Treatment: Gait belt Activity  Tolerance: Patient tolerated treatment well Patient left: with call bell/phone within reach;in bed;with bed alarm set     Time: 6825-7493 PT Time Calculation (min) (ACUTE ONLY): 14 min  Charges:  $Gait Training: 8-22 mins                    G Codes:      Melvena Vink 15-May-2015, 1:57 PM Pager [phone]

## 2015-04-30 NOTE — Telephone Encounter (Signed)
New message      TOC appt made for 05-14-15 with Dayna Dunn. Pt is going to [facility]--not home after discharge

## 2015-04-30 NOTE — Clinical Social Work Note (Signed)
Clinical Social Work Assessment  Patient Details  Name: [name] [name] MRN: [id number] Date of Birth: 1951/03/20  Date of referral:  04/29/15               Reason for consult:  Facility Placement, Discharge Planning                Permission sought to share information with:    Permission granted to share information::  No  Name::     n/a  Agency::  n/a  Relationship::  n/a  Contact Information:  n/a  Housing/Transportation Living arrangements for the past 2 months:  Single Family Home Source of Information:  Patient Patient Interpreter Needed:  None Criminal Activity/Legal Involvement Pertinent to Current Situation/Hospitalization:  No - Comment as needed Significant Relationships:  Spouse Lives with:  Spouse Do you feel safe going back to the place where you live?  No (Patient would prefer to return home, but understanding of need for SNF placement.) Need for family participation in patient care:  No (Coment) (Patient alert and oriented, able to make own decisions.)  Care giving concerns:  Patient expressed no concerns at this time.   Social Worker assessment / plan:  CSW received referral for possible SNF placement at time of discharge. CSW met with patient to discuss discharge disposition. Patient states patient from home with husband and would prefer to discharge home, however, was understanding of PT recommendation for SNF placement at time of discharge. CSW to continue to follow and assist with discharge planning needs.  Employment status:  Retired Nurse, adult PT Recommendations:  [place] / Referral to community resources:  [place]  Patient/Family's Response to care:  Patient understanding and agreeable to CSW plan of care.  Patient/Family's Understanding of and Emotional Response to Diagnosis, Current Treatment, and Prognosis:  Patient understanding and agreeable to CSW plan of care.  Emotional  Assessment Appearance:  Appears stated age Attitude/Demeanor/Rapport:  Other (Pleasant) Affect (typically observed):  Appropriate, Pleasant, Quiet Orientation:  Oriented to Self, Oriented to Place, Oriented to  Time, Oriented to Situation Alcohol / Substance use:  Not Applicable Psych involvement (Current and /or in the community):  No (Comment) (Not appropriate on this admission.)  Discharge Needs  Concerns to be addressed:  No discharge needs identified Readmission within the last 30 days:  No Current discharge risk:  None Barriers to Discharge:  No Barriers Identified   Caroline Sauger, LCSW 04/30/2015, 3:01 PM [phone]

## 2015-04-30 NOTE — Progress Notes (Signed)
PT Cancellation Note  Patient Details Name: MARGUERETTE SHELLER MRN: [id number] DOB: 1951-05-04   Cancelled Treatment:    Reason Eval/Treat Not Completed: Patient at procedure or test/unavailable. Pt being fit for Life-Vest and requested to return after 20 minutes. Will return as soon as possible (noted anticipate d/c to SNF today)   [employer] 04/30/2015, 11:54 AM Pager [phone]

## 2015-04-30 NOTE — Progress Notes (Signed)
Pt discharged to skilled care via EMS per stretcher, report called to Kathlee Nations at [facility], condition stable, wearing life vest, verbalized understanding of life vest purpose and use, educational materials sent with pt.

## 2015-05-01 ENCOUNTER — Non-Acute Institutional Stay (SKILLED_NURSING_FACILITY): Payer: Medicare Other | Admitting: Adult Health

## 2015-05-01 ENCOUNTER — Encounter: Payer: Self-pay | Admitting: Adult Health

## 2015-05-01 DIAGNOSIS — K219 Gastro-esophageal reflux disease without esophagitis: Secondary | ICD-10-CM | POA: Diagnosis not present

## 2015-05-01 DIAGNOSIS — E119 Type 2 diabetes mellitus without complications: Secondary | ICD-10-CM | POA: Diagnosis not present

## 2015-05-01 DIAGNOSIS — R55 Syncope and collapse: Secondary | ICD-10-CM | POA: Diagnosis not present

## 2015-05-01 DIAGNOSIS — R5381 Other malaise: Secondary | ICD-10-CM | POA: Diagnosis not present

## 2015-05-01 DIAGNOSIS — I2510 Atherosclerotic heart disease of native coronary artery without angina pectoris: Secondary | ICD-10-CM

## 2015-05-01 DIAGNOSIS — F329 Major depressive disorder, single episode, unspecified: Secondary | ICD-10-CM | POA: Diagnosis not present

## 2015-05-01 DIAGNOSIS — K5900 Constipation, unspecified: Secondary | ICD-10-CM

## 2015-05-01 DIAGNOSIS — F32A Depression, unspecified: Secondary | ICD-10-CM

## 2015-05-01 DIAGNOSIS — N186 End stage renal disease: Secondary | ICD-10-CM

## 2015-05-01 NOTE — Progress Notes (Signed)
Patient ID: [name] [name], female   DOB: [birth year], 64 y.o.   MRN: [id number]   05/01/2015  Facility:  Nursing Home Location:  [place] Room Number: 809-9 LEVEL OF CARE:  SNF (31)   Chief Complaint  Patient presents with  . Hospitalization Follow-up    Physical deconditioning, syncope,severe multivessel coronary disease, diabetes mellitus, GERD, depression, constipation and ESRD    HISTORY OF PRESENT ILLNESS:  This is a 64 year old female who has been admitted to [facility] on 04/30/15 from [facility]. She has PMH of ESRD, diabetes mellitus type 2, osteoarthritis memory loss, GERD and depression. She was in a store and had a syncopal episode. She was brought to the ED. EKG shows normal sinus rhythm with type I AV block and nonspecific T-wave changes in lateral leads. Orthostatic VS were Positive. Troponin mildly elevated 0.05>0.04>0.06. Repeat EKG with continued non specific T wave changes. Echo showed EF 35% Severe hypokinesis of inferior wall and the septum (new). Wall thickness was increased in a pattern of mild LVH PA peak pressure: 44 mm Hg. Cardiology was consulted in the setting of worsened heart function. Ischemic evaluation was performed. Patient underwent cardiac catheterization, which revealed severe multivessel disease and inferior wall hypokinesis with an EF 45%. Recommendation was ultimately to consider CABG. In the setting of her co-morbidities and recent facial trauma, she was started on medical therapy. Patient's blood pressure and HR did not tolerate BB therapy and in the setting of her ESRD an ACE-I/ARB was not initiated. She was continued only on Imdur, which she tolerated well. She was evaluated by CT surgery, who also recommended medical therapy as tolerated for now. EP was consulted, who did not recommend a loop recorder at this time but did recommend a Lifevest.   Patient was seen today. She has a Lifevest. Her face  is moderately bruised and has hematoma on right orbital area.She did not verbalized any complaint.  She has been admitted for a short-term rehabilitation.  PAST MEDICAL HISTORY:  Past Medical History  Diagnosis Date  . Hearing difficulty     Mild   . Diabetes mellitus without complication   . Coronary artery disease     a. cath 6/16 Severe multivessel cad with diffuse significant segmental LAD stenosis, 95% stenosis in a large second diagonal, 75% ostial stenosis in the smaller first diagonal, 95% mid and 65% distal circumflex, 95% ostial/proximal PDA, and 40-50% left main ostial disease  . ESRD (end stage renal disease) on dialysis        . Hypertension   . Ischemic cardiomyopathy   . Syncope     CURRENT MEDICATIONS: Reviewed per MAR/see medication list  Allergies  Allergen Reactions  . Aleve [Naproxen Sodium]     Due to kidneys.  . Motrin [Ibuprofen]     Due to kidneys.     REVIEW OF SYSTEMS:  GENERAL: no change in appetite, no fatigue, no weight changes, no fever, chills or weakness RESPIRATORY: no cough, SOB, DOE, wheezing, hemoptysis CARDIAC: no chest pain, edema or palpitations GI: no abdominal pain, diarrhea, constipation, heart burn, nausea or vomiting  PHYSICAL EXAMINATION  GENERAL: no acute distress, obese SKIN:  Right orbital hematoma, moderate bruising on face; right shin has dry scabs/lesions EYES: conjunctivae normal, sclerae normal, normal eye lids NECK: supple, trachea midline, no neck masses, no thyroid tenderness, no thyromegaly LYMPHATICS: no LAN in the neck, no supraclavicular LAN RESPIRATORY: breathing is even & unlabored, BS CTAB CARDIAC: RRR,  no murmur,no extra heart sounds, no edema GI: abdomen soft, normal BS, no masses, no tenderness, no hepatomegaly, no splenomegaly EXTREMITIES:  Able to move 4 extremities; LUE AVF + bruit/thrill PSYCHIATRIC: the patient is alert & oriented to person, affect & behavior appropriate  LABS/RADIOLOGY: Labs  reviewed: Basic Metabolic Panel:  Recent Labs  04/25/15 1230  04/26/15 1230 04/27/15 1226 04/29/15 0320  NA 133*  < > 130* 134* 132*  K 3.9  < > 3.7 3.5 4.1  CL 95*  < > 94* 96* 95*  CO2 26  < > 23 26 24   GLUCOSE 91  < > 146* 153* 102*  BUN 27*  < > 39* 22* 41*  CREATININE 4.02*  < > 5.83* 4.08* 6.37*  CALCIUM 8.3*  < > 8.6* 8.4* 9.1  PHOS 3.5  --  4.2  --  4.2  < > = values in this interval not displayed. Liver Function Tests:  Recent Labs  04/26/15 0305 04/26/15 1230 04/29/15 0320  AST 28  --   --   ALT 19  --   --   ALKPHOS 112  --   --   BILITOT 1.2  --   --   PROT 6.1*  --   --   ALBUMIN 3.2* 3.1* 3.2*    Recent Labs  04/27/15 1124  AMMONIA 39*   CBC:  Recent Labs  06/12/14 1045 11/18/14 0850  04/26/15 0305 04/26/15 1230 04/29/15 0320  WBC 8.5 6.6  < > 6.7 5.7 6.7  NEUTROABS 6.4 5.0  --   --   --   --   HGB 11.3* 13.1  < > 11.0* 10.3* 11.2*  HCT 32.6* 41.0  < > 32.8* 30.6* 33.7*  MCV 99.1 104.9*  < > 100.6* 100.7* 100.9*  PLT 247 120*  < > 115* 116* 126*  < > = values in this interval not displayed.  Lipid Panel:  Recent Labs  06/12/14 1045  HDL 51   Cardiac Enzymes:  Recent Labs  04/23/15 1500 04/23/15 1939 04/24/15 0222  TROPONINI 0.05* 0.06* 0.05*   CBG:  Recent Labs  04/30/15 0750 04/30/15 1119 04/30/15 1612  GLUCAP 106* 167* 98    Dg Chest 2 View  04/23/2015   CLINICAL DATA:  Fall with right hip pain.  Shortness of breath.  EXAM: CHEST  2 VIEW  COMPARISON:  02/21/2013  FINDINGS: The heart is enlarged. Diffuse reticular opacities suggesting pulmonary edema. No confluent airspace disease. No pleural effusion. No pneumothorax. No acute osseous abnormalities are seen. Hyperdensity in the left upper abdomen is likely related to ingested material.  IMPRESSION: Cardiomegaly and pulmonary edema.  Correlation for CHF recommended.   Electronically Signed   By: Jeb Levering M.D.   On: 04/23/2015 00:05   Ct Head Wo  Contrast  04/22/2015   CLINICAL DATA:  Unwitnessed fall with large right orbital hematoma, initial encounter  EXAM: CT HEAD WITHOUT CONTRAST  CT MAXILLOFACIAL WITHOUT CONTRAST  CT CERVICAL SPINE WITHOUT CONTRAST  TECHNIQUE: Multidetector CT imaging of the head, cervical spine, and maxillofacial structures were performed using the standard protocol without intravenous contrast. Multiplanar CT image reconstructions of the cervical spine and maxillofacial structures were also generated.  COMPARISON:  None.  FINDINGS: CT HEAD FINDINGS  Bony calvarium is intact. A large soft tissue hematoma is noted about the right orbit and right cheek consistent with the patient's recent injury. Mild atrophic changes are noted. No findings to suggest acute hemorrhage, acute infarction or space-occupying mass  lesion are noted.  CT MAXILLOFACIAL FINDINGS  Significant motion artifact is identified limiting the exam. No definitive fracture is seen. Significant soft tissue swelling is again noted in the region of the right cheek and right orbit. A focal 2.5 cm hematoma with a fluid fluid level is noted just lateral to the right orbit. The orbits and their contents are within normal limits. Dental caries are noted. No other focal abnormality is seen.  CT CERVICAL SPINE FINDINGS  Seven cervical segments are well visualized. Multilevel disc space narrowing is noted with mild associated osteophytic changes. Facet hypertrophic changes are seen. No acute fracture or acute facet abnormality is noted. The surrounding soft tissues are within normal limits.  IMPRESSION: CT of the head: Atrophic changes without acute intracranial abnormality.  Significant right facial hematoma.  CT of the maxillofacial bones: Considerable right facial soft tissue changes consistent with a recent injury. A focal 2.5 cm hematoma is noted just lateral to the right orbit. No acute fracture is identified.  CT of the cervical spine: Multilevel degenerative change without  acute abnormality.   Electronically Signed   By: Inez Catalina M.D.   On: 04/22/2015 22:53   Ct Cervical Spine Wo Contrast  04/22/2015   CLINICAL DATA:  Unwitnessed fall with large right orbital hematoma, initial encounter  EXAM: CT HEAD WITHOUT CONTRAST  CT MAXILLOFACIAL WITHOUT CONTRAST  CT CERVICAL SPINE WITHOUT CONTRAST  TECHNIQUE: Multidetector CT imaging of the head, cervical spine, and maxillofacial structures were performed using the standard protocol without intravenous contrast. Multiplanar CT image reconstructions of the cervical spine and maxillofacial structures were also generated.  COMPARISON:  None.  FINDINGS: CT HEAD FINDINGS  Bony calvarium is intact. A large soft tissue hematoma is noted about the right orbit and right cheek consistent with the patient's recent injury. Mild atrophic changes are noted. No findings to suggest acute hemorrhage, acute infarction or space-occupying mass lesion are noted.  CT MAXILLOFACIAL FINDINGS  Significant motion artifact is identified limiting the exam. No definitive fracture is seen. Significant soft tissue swelling is again noted in the region of the right cheek and right orbit. A focal 2.5 cm hematoma with a fluid fluid level is noted just lateral to the right orbit. The orbits and their contents are within normal limits. Dental caries are noted. No other focal abnormality is seen.  CT CERVICAL SPINE FINDINGS  Seven cervical segments are well visualized. Multilevel disc space narrowing is noted with mild associated osteophytic changes. Facet hypertrophic changes are seen. No acute fracture or acute facet abnormality is noted. The surrounding soft tissues are within normal limits.  IMPRESSION: CT of the head: Atrophic changes without acute intracranial abnormality.  Significant right facial hematoma.  CT of the maxillofacial bones: Considerable right facial soft tissue changes consistent with a recent injury. A focal 2.5 cm hematoma is noted just lateral to the  right orbit. No acute fracture is identified.  CT of the cervical spine: Multilevel degenerative change without acute abnormality.   Electronically Signed   By: Inez Catalina M.D.   On: 04/22/2015 22:53   Dg Hip Unilat With Pelvis 2-3 Views Right  04/23/2015   CLINICAL DATA:  Fall, RIGHT hip pain.  EXAM: RIGHT HIP (WITH PELVIS) 2-3 VIEWS  COMPARISON:  None.  FINDINGS: There is no evidence of hip fracture or dislocation. There is mild RIGHT hip joint space narrowing. No pelvic fracture. Fecal impaction.  IMPRESSION: No acute hip fracture is evident.  Degenerative change.   Electronically Signed  By: Rolla Flatten M.D.   On: 04/23/2015 00:08   Ct Maxillofacial Wo Cm  04/22/2015   CLINICAL DATA:  Unwitnessed fall with large right orbital hematoma, initial encounter  EXAM: CT HEAD WITHOUT CONTRAST  CT MAXILLOFACIAL WITHOUT CONTRAST  CT CERVICAL SPINE WITHOUT CONTRAST  TECHNIQUE: Multidetector CT imaging of the head, cervical spine, and maxillofacial structures were performed using the standard protocol without intravenous contrast. Multiplanar CT image reconstructions of the cervical spine and maxillofacial structures were also generated.  COMPARISON:  None.  FINDINGS: CT HEAD FINDINGS  Bony calvarium is intact. A large soft tissue hematoma is noted about the right orbit and right cheek consistent with the patient's recent injury. Mild atrophic changes are noted. No findings to suggest acute hemorrhage, acute infarction or space-occupying mass lesion are noted.  CT MAXILLOFACIAL FINDINGS  Significant motion artifact is identified limiting the exam. No definitive fracture is seen. Significant soft tissue swelling is again noted in the region of the right cheek and right orbit. A focal 2.5 cm hematoma with a fluid fluid level is noted just lateral to the right orbit. The orbits and their contents are within normal limits. Dental caries are noted. No other focal abnormality is seen.  CT CERVICAL SPINE FINDINGS  Seven  cervical segments are well visualized. Multilevel disc space narrowing is noted with mild associated osteophytic changes. Facet hypertrophic changes are seen. No acute fracture or acute facet abnormality is noted. The surrounding soft tissues are within normal limits.  IMPRESSION: CT of the head: Atrophic changes without acute intracranial abnormality.  Significant right facial hematoma.  CT of the maxillofacial bones: Considerable right facial soft tissue changes consistent with a recent injury. A focal 2.5 cm hematoma is noted just lateral to the right orbit. No acute fracture is identified.  CT of the cervical spine: Multilevel degenerative change without acute abnormality.   Electronically Signed   By: Inez Catalina M.D.   On: 04/22/2015 22:53    ASSESSMENT/PLAN:  Physical deconditioning - for rehabilitation Syncope -  Coreg was discontinued per nephrology due to bradycardia; will consider adding as BP permits or Ok with nephrology Severe multivessel coronary disease - follow-up with cardiology for posssible staged PCI vs ICD vs CABG; medical management once she recovers from trauma from head and neck ; continue ASA 81 mg PO Q D and Imdur 15 mg PO Q D Constipation - continue Colace 100 mg 1 capsule by mouth daily when necessary Diabetes mellitus, type II - continue Lantus 30 units subcutaneous daily at bedtime GERD - continue Reglan 5 mg by mouth daily and pantoprazole 40 mg by mouth daily  Depression - venlafaxine at 37.5 mg by mouth daily, last dose 05/05/15; tapering off due to multiple falls; then will re-asses mood ESRD - continue HD Monday-Wednesday-Friday   Goals of care:  Short-term rehabilitation  Labs/test ordered:  CBC and BMP  Spent 50 minutes in patient care.    [facility], NP [employer] [phone]

## 2015-05-01 NOTE — Telephone Encounter (Signed)
Patient is currently at [facility].  I called [phone] and asked to speak with patient; I was advised that the patient was at dialysis.  I advised we will try to call back tomorrow.

## 2015-05-02 SURGERY — CORONARY ARTERY BYPASS GRAFTING (CABG)
Anesthesia: General | Site: Chest

## 2015-05-02 NOTE — Telephone Encounter (Signed)
Spoke with Therapist, art at [facility]. Reviewed appointments with her which supervisor added to their calendar for event monitor and f/u with Melina Copa. Verified to come to [facility].   Reviewed medications.  Pt has been given these appropriately. They added isosorbide mononitrate 15 mg daily and will monitor her BP and HR every shift for a week.

## 2015-05-03 ENCOUNTER — Encounter: Payer: Self-pay | Admitting: Internal Medicine

## 2015-05-03 NOTE — Progress Notes (Deleted)
Patient ID: [name] [name], female   DOB: January 14, 1951, 64 y.o.   MRN: [id number]     [place]  PCP: Tommi Rumps, MD  Code Status: Full Code   Allergies  Allergen Reactions  . Aleve [Naproxen Sodium]     Due to kidneys.  . Motrin [Ibuprofen]     Due to kidneys.    Chief Complaint  Patient presents with  . New Admit To SNF    New Admission      HPI:     year old patient is here for short term rehabilitation post hospital admission from   Review of Systems:  Constitutional: Negative for fever, chills, weight loss, malaise/fatigue and diaphoresis.  HENT: Negative for headache, congestion, nasal discharge, hearing loss, earache, sore throat, difficulty swallowing.   Eyes: Negative for eye pain, blurred vision, double vision and discharge.  Respiratory: Negative for cough, shortness of breath and wheezing.   Cardiovascular: Negative for chest pain, palpitations, leg swelling.  Gastrointestinal: Negative for heartburn, nausea, vomiting, abdominal pain, melena, rectal bleed, diarrhea and constipation. appetite is  Genitourinary: Negative for dysuria, urgency, frequency, hematuria, incontinence, nocturia and flank pain.  Musculoskeletal: Negative for back pain, falls, joint pain and myalgias. assistive device used Skin: Negative for itching, sores and rash.  Neurological: Negative for weakness,dizziness, tingling, focal weakness Psychiatric/Behavioral: Negative for depression, anxiety, insomnia and memory loss.    Past Medical History  Diagnosis Date  . Hearing difficulty     Mild   . Diabetes mellitus without complication   . Coronary artery disease     a. cath 6/16 Severe multivessel cad with diffuse significant segmental LAD stenosis, 95% stenosis in a large second diagonal, 75% ostial stenosis in the smaller first diagonal, 95% mid and 65% distal circumflex, 95% ostial/proximal PDA, and 40-50% left main ostial disease  . ESRD (end stage renal disease)  on dialysis        . Hypertension   . Ischemic cardiomyopathy   . Syncope    Past Surgical History  Procedure Laterality Date  . 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 Esophagogastroduodenoscopy Left 02/20/2013    Procedure: ESOPHAGOGASTRODUODENOSCOPY (EGD);  Surgeon: Cleotis Nipper, MD;  Location: [facility] ENDOSCOPY;  Service: Endoscopy;  Laterality: Left;  . Av fistula placement Left 03/07/2013    Procedure: ARTERIOVENOUS (AV) FISTULA CREATION;  Surgeon: Elam Dutch, MD;  Location: [facility] OR;  Service: Vascular;  Laterality: Left;  left brachiocephalic AVF  . Cardiac catheterization N/A 04/25/2015    Procedure: Left Heart Cath and Coronary Angiography;  Surgeon: Belva Crome, MD;  Location: [facility];  Service: Cardiovascular;  Laterality: N/A;   Social History:   reports that she has never smoked. She has never used smokeless tobacco. She reports that she does not drink alcohol or use illicit drugs.  Family History  Problem Relation Age of Onset  . Heart disease    . Diabetes    . Stroke    . Bone cancer Father     Of jaws   . Throat cancer Father     Medications: Patient's Medications  New Prescriptions   No medications on file  Previous Medications   ASPIRIN 81 MG EC TABLET    Take 1 tablet (81 mg total) by mouth daily.   CALCIUM CARBONATE (OS-CAL - DOSED IN MG OF ELEMENTAL CALCIUM) 1250 MG TABLET    TAKE 1 TABLET BY MOUTH THREE TIMES DAILY WITH MEALS  DOCUSATE SODIUM (DOCQLACE) 100 MG CAPSULE    Take 1 capsule (100 mg total) by mouth daily as needed.   INSULIN GLARGINE (LANTUS) 100 UNIT/ML INJECTION    Inject 0.03 mLs (3 Units total) into the skin at bedtime.   ISOSORBIDE MONONITRATE PO    Take 15 mg by mouth.   METOCLOPRAMIDE (REGLAN) 5 MG TABLET    TAKE 1 TABLET BY MOUTH DAILY BEFORE BREAKFAST   MULTIVITAMIN (RENA-VIT) TABS TABLET    Take 1 tablet by mouth at bedtime.   PANTOPRAZOLE (PROTONIX) 40 MG TABLET    Take 1 tablet (40 mg total)  by mouth daily.   PROMETHAZINE (PHENERGAN) 25 MG TABLET    Take 1 tablet (25 mg total) by mouth every 6 (six) hours as needed for nausea.   VENLAFAXINE XR (EFFEXOR-XR) 37.5 MG 24 HR CAPSULE    Take 1 capsule (37.5 mg total) by mouth daily with breakfast. Last dose 05/05/15  Modified Medications   No medications on file  Discontinued Medications   No medications on file     Physical Exam: *** Filed Vitals:   05/03/15 0942  BP: 120/90  Pulse: 74  Temp: 97.4 F (36.3 C)  TempSrc: Oral  Resp: 18  Height: 5\' 5"  (1.651 m)  Weight: 200 lb (90.719 kg)  SpO2: 96%    General- elderly female, well built, in no acute distress Head- normocephalic, atraumatic Ears- left ear normal tympanic membrane and normal external ear canal , right ear normal tympanic membrane and normal external ear canal Nose- normal nasal mucosa, no maxillary or frontal sinus tenderness, no nasal discharge Throat- moist mucus membrane, normal oropharynx, dentition is  Eyes- PERRLA, EOMI, no pallor, no icterus, no discharge, normal conjunctiva, normal sclera Neck- no cervical lymphadenopathy, no supraclavicular lymphadenopathy, no thyromegaly, no jugular vein distension, no carotid bruit Chest- no chest wall deformities, no chest wall tenderness Breast- normal appearance, no masses or lumps on palpation, normal nipple and areola exam, no axillary lymphadenopathy Cardiovascular- normal s1,s2, no murmurs/ rubs/ gallops, dorsalis pedis and radial pulses, leg edema Respiratory- bilateral clear to auscultation, no wheeze, no rhonchi, no crackles, no use of accessory muscles Abdomen- bowel sounds present, soft, non tender, no organomegaly, no abdominal bruits, no guarding or rigidity, no CVA tenderness Pelvic exam- normal pelvic exam, no adenexal or cervical motion tenderness Musculoskeletal- able to move all 4 extremities, no spinal and paraspinal tenderness, normal back curvature, steady gait, no use of assistive device,  normal range of motion Neurological- no focal deficit, normal reflexes, normal muscle strength, normal sensation to fine touch and vibration Skin- warm and dry Nails- hypertrophy, ingrown Psychiatry- alert and oriented to person, place and time, normal mood and affect    Labs reviewed: Basic Metabolic Panel:  Recent Labs  04/25/15 1230  04/26/15 1230 04/27/15 1226 04/29/15 0320  NA 133*  < > 130* 134* 132*  K 3.9  < > 3.7 3.5 4.1  CL 95*  < > 94* 96* 95*  CO2 26  < > 23 26 24   GLUCOSE 91  < > 146* 153* 102*  BUN 27*  < > 39* 22* 41*  CREATININE 4.02*  < > 5.83* 4.08* 6.37*  CALCIUM 8.3*  < > 8.6* 8.4* 9.1  PHOS 3.5  --  4.2  --  4.2  < > = values in this interval not displayed. Liver Function Tests:  Recent Labs  04/26/15 0305 04/26/15 1230 04/29/15 0320  AST 28  --   --  ALT 19  --   --   ALKPHOS 112  --   --   BILITOT 1.2  --   --   PROT 6.1*  --   --   ALBUMIN 3.2* 3.1* 3.2*   No results for input(s): LIPASE, AMYLASE in the last 8760 hours.  Recent Labs  04/27/15 1124  AMMONIA 39*   CBC:  Recent Labs  06/12/14 1045 11/18/14 0850  04/26/15 0305 04/26/15 1230 04/29/15 0320  WBC 8.5 6.6  < > 6.7 5.7 6.7  NEUTROABS 6.4 5.0  --   --   --   --   HGB 11.3* 13.1  < > 11.0* 10.3* 11.2*  HCT 32.6* 41.0  < > 32.8* 30.6* 33.7*  MCV 99.1 104.9*  < > 100.6* 100.7* 100.9*  PLT 247 120*  < > 115* 116* 126*  < > = values in this interval not displayed. Cardiac Enzymes:  Recent Labs  04/23/15 1500 04/23/15 1939 04/24/15 0222  TROPONINI 0.05* 0.06* 0.05*   BNP: Invalid input(s): POCBNP CBG:  Recent Labs  04/30/15 0750 04/30/15 1119 04/30/15 1612  GLUCAP 106* 167* 98    Radiological Exams:   EKG: Independently reviewed. ***  Assessment/Plan No problem-specific assessment & plan notes found for this encounter.     {Rehab current functional status:30803}   Goals of care: ***   Labs/tests ordered  Family/ staff Communication:  reviewed care plan with patient and nursing supervisor    Blanchie Serve, MD  [facility] [phone] (Monday-Friday 8 am - 5 pm) [phone] (afterhours)

## 2015-05-06 ENCOUNTER — Inpatient Hospital Stay: Payer: BLUE CROSS/BLUE SHIELD | Admitting: Family Medicine

## 2015-05-07 ENCOUNTER — Ambulatory Visit: Payer: Medicare Other

## 2015-05-07 ENCOUNTER — Non-Acute Institutional Stay (SKILLED_NURSING_FACILITY): Payer: Medicare Other | Admitting: Internal Medicine

## 2015-05-07 DIAGNOSIS — R55 Syncope and collapse: Secondary | ICD-10-CM | POA: Diagnosis not present

## 2015-05-07 DIAGNOSIS — K5900 Constipation, unspecified: Secondary | ICD-10-CM

## 2015-05-07 DIAGNOSIS — E1169 Type 2 diabetes mellitus with other specified complication: Secondary | ICD-10-CM | POA: Insufficient documentation

## 2015-05-07 DIAGNOSIS — N186 End stage renal disease: Secondary | ICD-10-CM | POA: Diagnosis not present

## 2015-05-07 DIAGNOSIS — N189 Chronic kidney disease, unspecified: Secondary | ICD-10-CM

## 2015-05-07 DIAGNOSIS — E119 Type 2 diabetes mellitus without complications: Secondary | ICD-10-CM | POA: Diagnosis not present

## 2015-05-07 DIAGNOSIS — E669 Obesity, unspecified: Secondary | ICD-10-CM | POA: Diagnosis not present

## 2015-05-07 DIAGNOSIS — R5381 Other malaise: Secondary | ICD-10-CM

## 2015-05-07 DIAGNOSIS — I2510 Atherosclerotic heart disease of native coronary artery without angina pectoris: Secondary | ICD-10-CM | POA: Diagnosis not present

## 2015-05-07 DIAGNOSIS — K219 Gastro-esophageal reflux disease without esophagitis: Secondary | ICD-10-CM | POA: Diagnosis not present

## 2015-05-07 DIAGNOSIS — M170 Bilateral primary osteoarthritis of knee: Secondary | ICD-10-CM

## 2015-05-07 DIAGNOSIS — Z992 Dependence on renal dialysis: Secondary | ICD-10-CM | POA: Diagnosis not present

## 2015-05-07 DIAGNOSIS — D631 Anemia in chronic kidney disease: Secondary | ICD-10-CM | POA: Diagnosis not present

## 2015-05-07 NOTE — Progress Notes (Signed)
This encounter was created in error - please disregard.

## 2015-05-07 NOTE — Progress Notes (Signed)
Patient ID: [name] [name], female   DOB: 23-May-1951, 64 y.o.   MRN: [id number]     [facility]   PCP: Tommi Rumps, MD  Code Status: full code  Allergies  Allergen Reactions  . Aleve [Naproxen Sodium]     Due to kidneys.  . Motrin [Ibuprofen]     Due to kidneys.    Chief Complaint  Patient presents with  . New Admit To SNF     HPI:  64 year old patient is here for short term rehabilitation post hospital admission from 04/22/15-04/29/15 with syncopal episode. She was noted to be orthostatic and echocardiogram showed new severe hypokinesis of inferior wall and the septum. Cardiology was consulted and she had cardiac catheterization showing severe multivessel disease and inferior wall hypokinesis with EF 45%. CABG was recommended but given her co-morbidities, cardiothoracic surgery opted medical management. EP team was consulted and life vest was provided. She has PMH of ESRD on HD, dm type 2, OA, memory loss, gerd and depression. She is seen in her room today. She had cardiology follow up appointment today. No notes for review but as per patient, plan is to have a loop recorder placed. She complaints of easy fatigue and weakness but denies any other concerns.  Review of Systems:  Constitutional: Negative for fever, chills, diaphoresis.  HENT: Negative for headache, congestion, nasal discharge Eyes: Negative for eye pain, blurred vision, double vision and discharge.  Respiratory: Negative for cough, shortness of breath and wheezing.   Cardiovascular: Negative for chest pain, palpitations. Has chronic leg swelling.  Gastrointestinal: Negative for heartburn, nausea, vomiting, abdominal pain Genitourinary: Negative for dysuria, makes minimal urine, on HD 3 days a week Musculoskeletal: Negative for back pain, falls in facility Skin: Negative for itching, rash.  Neurological: Negative for dizziness, tingling, focal weakness   Past Medical History    Diagnosis Date  . Hearing difficulty     Mild   . Diabetes mellitus without complication   . Coronary artery disease     a. cath 6/16 Severe multivessel cad with diffuse significant segmental LAD stenosis, 95% stenosis in a large second diagonal, 75% ostial stenosis in the smaller first diagonal, 95% mid and 65% distal circumflex, 95% ostial/proximal PDA, and 40-50% left main ostial disease  . ESRD (end stage renal disease) on dialysis        . Hypertension   . Ischemic cardiomyopathy   . Syncope    Past Surgical History  Procedure Laterality Date  . 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 Esophagogastroduodenoscopy Left 02/20/2013    Procedure: ESOPHAGOGASTRODUODENOSCOPY (EGD);  Surgeon: Cleotis Nipper, MD;  Location: [facility] ENDOSCOPY;  Service: Endoscopy;  Laterality: Left;  . Av fistula placement Left 03/07/2013    Procedure: ARTERIOVENOUS (AV) FISTULA CREATION;  Surgeon: Elam Dutch, MD;  Location: [facility] OR;  Service: Vascular;  Laterality: Left;  left brachiocephalic AVF  . Cardiac catheterization N/A 04/25/2015    Procedure: Left Heart Cath and Coronary Angiography;  Surgeon: Belva Crome, MD;  Location: [facility];  Service: Cardiovascular;  Laterality: N/A;   Social History:   reports that she has never smoked. She has never used smokeless tobacco. She reports that she does not drink alcohol or use illicit drugs.  Family History  Problem Relation Age of Onset  . Heart disease    . Diabetes    . Stroke    . Bone cancer Father  Of jaws   . Throat cancer Father     Medications: Patient's Medications  New Prescriptions   No medications on file  Previous Medications   ASPIRIN 81 MG EC TABLET    Take 1 tablet (81 mg total) by mouth daily.   CALCIUM CARBONATE (OS-CAL - DOSED IN MG OF ELEMENTAL CALCIUM) 1250 MG TABLET    TAKE 1 TABLET BY MOUTH THREE TIMES DAILY WITH MEALS   DOCUSATE SODIUM (DOCQLACE) 100 MG CAPSULE    Take 1 capsule (100  mg total) by mouth daily as needed.   INSULIN GLARGINE (LANTUS) 100 UNIT/ML INJECTION    Inject 0.03 mLs (3 Units total) into the skin at bedtime.   ISOSORBIDE MONONITRATE PO    Take 15 mg by mouth.   METOCLOPRAMIDE (REGLAN) 5 MG TABLET    TAKE 1 TABLET BY MOUTH DAILY BEFORE BREAKFAST   MULTIVITAMIN (RENA-VIT) TABS TABLET    Take 1 tablet by mouth at bedtime.   PANTOPRAZOLE (PROTONIX) 40 MG TABLET    Take 1 tablet (40 mg total) by mouth daily.   PROMETHAZINE (PHENERGAN) 25 MG TABLET    Take 1 tablet (25 mg total) by mouth every 6 (six) hours as needed for nausea.   VENLAFAXINE XR (EFFEXOR-XR) 37.5 MG 24 HR CAPSULE    Take 1 capsule (37.5 mg total) by mouth daily with breakfast. Last dose 05/05/15  Modified Medications   No medications on file  Discontinued Medications   No medications on file     Physical Exam: Filed Vitals:   05/07/15 1802  BP: 128/86  Pulse: 85  Temp: 98 F (36.7 C)  Resp: 18  SpO2: 95%    General- elderly female, obese, in no acute distress HEENT- no pallor, no icterus, no discharge, normal conjunctiva, normal sclera, resolving bruise on face and small hematoma on right scalp, MMM Neck- no cervical lymphadenopathy, no jugular vein distension Cardiovascular- normal s1,s2, palpable dorsalis pedis and radial pulses, 1+ leg edema Respiratory- bilateral clear to auscultation, no wheeze, no rhonchi, no crackles, no use of accessory muscles Abdomen- bowel sounds present, soft, non tender Musculoskeletal- able to move all 4 extremities, generalized weakness Neurological- no focal deficit Skin- warm and dry, resolving bruises, life vest in place, left arm AV fistula with good thrill Psychiatry- alert and oriented to person, place and time, normal mood and affect    Labs reviewed: Basic Metabolic Panel:  Recent Labs  04/25/15 1230  04/26/15 1230 04/27/15 1226 04/29/15 0320  NA 133*  < > 130* 134* 132*  K 3.9  < > 3.7 3.5 4.1  CL 95*  < > 94* 96* 95*  CO2 26   < > 23 26 24   GLUCOSE 91  < > 146* 153* 102*  BUN 27*  < > 39* 22* 41*  CREATININE 4.02*  < > 5.83* 4.08* 6.37*  CALCIUM 8.3*  < > 8.6* 8.4* 9.1  PHOS 3.5  --  4.2  --  4.2  < > = values in this interval not displayed. Liver Function Tests:  Recent Labs  04/26/15 0305 04/26/15 1230 04/29/15 0320  AST 28  --   --   ALT 19  --   --   ALKPHOS 112  --   --   BILITOT 1.2  --   --   PROT 6.1*  --   --   ALBUMIN 3.2* 3.1* 3.2*   No results for input(s): LIPASE, AMYLASE in the last 8760 hours.  Recent Labs  04/27/15 1124  AMMONIA 39*   CBC:  Recent Labs  06/12/14 1045 11/18/14 0850  04/26/15 0305 04/26/15 1230 04/29/15 0320  WBC 8.5 6.6  < > 6.7 5.7 6.7  NEUTROABS 6.4 5.0  --   --   --   --   HGB 11.3* 13.1  < > 11.0* 10.3* 11.2*  HCT 32.6* 41.0  < > 32.8* 30.6* 33.7*  MCV 99.1 104.9*  < > 100.6* 100.7* 100.9*  PLT 247 120*  < > 115* 116* 126*  < > = values in this interval not displayed. Cardiac Enzymes:  Recent Labs  04/23/15 1500 04/23/15 1939 04/24/15 0222  TROPONINI 0.05* 0.06* 0.05*   BNP: Invalid input(s): POCBNP CBG:  Recent Labs  04/30/15 0750 04/30/15 1119 04/30/15 1612  GLUCAP 106* 167* 98    Assessment/Plan  Physical deconditioning Will have patient work with PT/OT as tolerated to regain strength and restore function.  Fall precautions are in place.  Syncope Thought to be cardiogenic and orthostatic. Pending cardiology consult note. Plan as per patient is to remove life vest and have a loop recorder placed. No syncopal episode in facility. Monitor clinically  ESRD Continue HD 3 days a week and monitor  CAD Has multivessel CAD, off coreg and ACEI with her bradycardia and renal impairment. continue ASA 81 mg daily and Imdur 15 mg daily  gerd Continue pantoprazole 40 mg daily and reglan  Constipation continue Colace 100 mg daily prn, monitor  Diabetes mellitus type II continue Lantus 30 units daily, monitor cbg  OA Continue  oscal for now and monitor  Anemia of chronic disease Monitor h&h  Goals of care: short term rehabilitation   Labs/tests ordered: cbc in 1 week  Family/ staff Communication: reviewed care plan with patient and nursing supervisor    Blanchie Serve, MD  [place] [phone] (Monday-Friday 8 am - 5 pm) [phone] (afterhours)

## 2015-05-13 ENCOUNTER — Encounter: Payer: Self-pay | Admitting: Physician Assistant

## 2015-05-13 ENCOUNTER — Non-Acute Institutional Stay (SKILLED_NURSING_FACILITY): Payer: Medicare Other | Admitting: Adult Health

## 2015-05-13 DIAGNOSIS — K219 Gastro-esophageal reflux disease without esophagitis: Secondary | ICD-10-CM

## 2015-05-13 DIAGNOSIS — K5900 Constipation, unspecified: Secondary | ICD-10-CM

## 2015-05-13 DIAGNOSIS — E119 Type 2 diabetes mellitus without complications: Secondary | ICD-10-CM

## 2015-05-13 DIAGNOSIS — E785 Hyperlipidemia, unspecified: Secondary | ICD-10-CM | POA: Insufficient documentation

## 2015-05-13 DIAGNOSIS — I2510 Atherosclerotic heart disease of native coronary artery without angina pectoris: Secondary | ICD-10-CM

## 2015-05-13 DIAGNOSIS — F329 Major depressive disorder, single episode, unspecified: Secondary | ICD-10-CM

## 2015-05-13 DIAGNOSIS — R5381 Other malaise: Secondary | ICD-10-CM

## 2015-05-13 DIAGNOSIS — F32A Depression, unspecified: Secondary | ICD-10-CM

## 2015-05-13 DIAGNOSIS — N186 End stage renal disease: Secondary | ICD-10-CM | POA: Diagnosis not present

## 2015-05-13 DIAGNOSIS — E1169 Type 2 diabetes mellitus with other specified complication: Secondary | ICD-10-CM

## 2015-05-13 DIAGNOSIS — R55 Syncope and collapse: Secondary | ICD-10-CM | POA: Diagnosis not present

## 2015-05-13 DIAGNOSIS — Z992 Dependence on renal dialysis: Secondary | ICD-10-CM | POA: Diagnosis not present

## 2015-05-13 DIAGNOSIS — E669 Obesity, unspecified: Secondary | ICD-10-CM | POA: Diagnosis not present

## 2015-05-13 NOTE — Progress Notes (Deleted)
Cardiology Office Note Date:  05/13/2015  Patient ID:  [name] [name], [name] [name] 28-May-1951, MRN [id number] PCP:  Tommi Rumps, MD  Cardiologist:  Tamala Julian Electrophysiologist: Lovena Lerefresh   Chief Complaint: follow-up syncope  History of Present Illness: [name] [name] is a 64 y.o. female with history of ESRD on HD, DM, OA, memory loss, GERD, depression, and recently diagnosed mutivessel CAD, ICM, syncope who presents for follow-up. She was admitted earlier this month with an episode of syncope while walking out to her car from a restaurant. She sustained soft tissue trauma to her right face, head and neck. Troponins were minimally elevated (0.06). 2D echo showed EF 35% with new WMA, mild-mod TR, mod dil LA, PASP 80mmHg. Cath showed multivessel CAD and EF 45% - Dr. Prescott Gum with CVTS recommended medical therapy, allowing her to recover from the trauma to the head and neck area and then reevaluation for PCI which would allow time for recovery before she would be placed on Plavix. He felt she would be a poor candidate for CABG given renal failure, fragile condition, poor mobility, venous stasis of the lower extremities with probable poor conduit, and currently very high risk of high-dose anticoagulation worsening her soft tissue injuries to the head and neck and face. EP saw the patient who ultimately recommended LifeVest, no driving x 6 months, and consideration for ICD if EF does not improve. She was also discharged with event monitor. The cardiology team recommended for her to be re-evaluated by CVTS once she had recovered from soft tissue injuries and if she was turned down would consider staged intervention/PCI. It could not be confirmed nor denied if her syncope was related to coronary disease. Her BP and HR would not tolerate BB therapy and in the setting of ACEI/ARB, ACEI was not initiated. Review of labs also reveals chronic appearing anemia (also noted in 2015), thrombocytopenia (noted in  11/2014) and hyponatremia. Last lipids in 2015 showed LDL 117. LFTs OK recent admission.  Statin?  Not on BB due to syncope   Past Medical History  Diagnosis Date  . Hearing difficulty     Mild   . Diabetes mellitus   . Coronary artery disease     a. cath 6/16 Severe multivessel cad with diffuse significant segmental LAD stenosis, 95% stenosis in a large second diagonal, 75% ostial stenosis in the smaller first diagonal, 95% mid and 65% distal circumflex, 95% ostial/proximal PDA, and 40-50% left main ostial disease. Med rx initially, to be re-evaluated by CVTS.  Marland Kitchen ESRD (end stage renal disease) on dialysis        . Hypertension   . Ischemic cardiomyopathy     a. Echo 04/2015: EF 35%. Cath same time - 45%.  . Syncope   . Hyponatremia   . Anemia   . Thrombocytopenia   . Memory loss     Past Surgical History  Procedure Laterality Date  . 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 Esophagogastroduodenoscopy Left 02/20/2013    Procedure: ESOPHAGOGASTRODUODENOSCOPY (EGD);  Surgeon: Cleotis Nipper, MD;  Location: [facility] ENDOSCOPY;  Service: Endoscopy;  Laterality: Left;  . Av fistula placement Left 03/07/2013    Procedure: ARTERIOVENOUS (AV) FISTULA CREATION;  Surgeon: Elam Dutch, MD;  Location: [facility] OR;  Service: Vascular;  Laterality: Left;  left brachiocephalic AVF  . Cardiac catheterization N/A 04/25/2015    Procedure: Left Heart Cath and Coronary Angiography;  Surgeon: Belva Crome, MD;  Location: [facility];  Service: Cardiovascular;  Laterality: N/A;    Current Outpatient Prescriptions  Medication Sig Dispense Refill  . aspirin 81 MG EC tablet Take 1 tablet (81 mg total) by mouth daily. 30 tablet 0  . calcium carbonate (OS-CAL - DOSED IN MG OF ELEMENTAL CALCIUM) 1250 MG tablet TAKE 1 TABLET BY MOUTH THREE TIMES DAILY WITH MEALS 100 tablet 0  . docusate sodium (DOCQLACE) 100 MG capsule Take 1 capsule (100 mg total) by mouth daily as needed. 30  capsule 3  . insulin glargine (LANTUS) 100 UNIT/ML injection Inject 0.03 mLs (3 Units total) into the skin at bedtime. 10 mL 6  . ISOSORBIDE MONONITRATE PO Take 15 mg by mouth.    . metoCLOPramide (REGLAN) 5 MG tablet TAKE 1 TABLET BY MOUTH DAILY BEFORE BREAKFAST 30 tablet 11  . multivitamin (RENA-VIT) TABS tablet Take 1 tablet by mouth at bedtime. 90 tablet 3  . pantoprazole (PROTONIX) 40 MG tablet Take 1 tablet (40 mg total) by mouth daily. 30 tablet 1  . promethazine (PHENERGAN) 25 MG tablet Take 1 tablet (25 mg total) by mouth every 6 (six) hours as needed for nausea. 30 tablet 1  . venlafaxine XR (EFFEXOR-XR) 37.5 MG 24 hr capsule Take 1 capsule (37.5 mg total) by mouth daily with breakfast. Last dose 05/05/15 5 capsule 0   No current facility-administered medications for this visit.    Allergies:   Aleve and Motrin   Social History:  The patient  reports that she has never smoked. She has never used smokeless tobacco. She reports that she does not drink alcohol or use illicit drugs.   Family History:  The patient's family history includes Bone cancer in her father; Diabetes in an other family member; Heart disease in an other family member; Stroke in an other family member; Throat cancer in her father.***  ROS:  Please see the history of present illness. Otherwise, review of systems is positive for ***.   All other systems are reviewed and otherwise negative.   PHYSICAL EXAM: *** VS:  There were no vitals taken for this visit. BMI: There is no weight on file to calculate BMI. Well nourished, well developed, in no acute distress HEENT: normocephalic, atraumatic Neck: no JVD, carotid bruits or masses Cardiac:  normal S1, S2; RRR; no murmurs, rubs, or gallops Lungs:  clear to auscultation bilaterally, no wheezing, rhonchi or rales Abd: soft, nontender, no hepatomegaly, + BS MS: no deformity or atrophy Ext: no edema Skin: warm and dry, no rash Neuro:  moves all extremities  spontaneously, no focal abnormalities noted, follows commands Psych: euthymic mood, full affect   EKG:  Done today shows ***  Recent Labs: 04/23/2015: B Natriuretic Peptide 3990.5* 04/25/2015: TSH 1.974 04/26/2015: ALT 19 04/29/2015: BUN 41*; Creatinine, Ser 6.37*; Hemoglobin 11.2*; Platelets 126*; Potassium 4.1; Sodium 132*  06/12/2014: Cholesterol 191; HDL 51; LDL Cholesterol 117*; Total CHOL/HDL Ratio 3.7; Triglycerides 115; VLDL 23   Estimated Creatinine Clearance: 9.8 mL/min (by C-G formula based on Cr of 6.37).   Wt Readings from Last 3 Encounters:  05/03/15 200 lb (90.719 kg)  05/01/15 201 lb (91.173 kg)  04/29/15 195 lb 1.7 oz (88.5 kg)     Other studies reviewed: Additional studies/records reviewed today include: summarized above***  ASSESSMENT AND PLAN:  1. CAD, multivessel 2. Ischemic cardiomyopathy 3. Syncope 4. Anemia/thrombocytopenia 5. ESRD on HD 6. Hyperlipidemia  Disposition: F/u with ***  Current medicines are reviewed at length with the patient today.  The patient  did not have any concerns regarding medicines.***  Signed, Melina Copa PA-C 05/13/2015 7:30 PM     [place] [address] [phone] (office)  [phone] (fax)

## 2015-05-13 NOTE — Progress Notes (Signed)
Patient ID: [name] [name], female   DOB: 20-Jan-1951, 64 y.o.   MRN: [id number]   05/13/2015  Facility:  Nursing Home Location:  [place] Room Number: 176-1 LEVEL OF CARE:  SNF (31)   Chief Complaint  Patient presents with  . Discharge Note    Physical deconditioning, syncope,severe multivessel coronary disease, diabetes mellitus, GERD, depression, constipation and ESRD    HISTORY OF PRESENT ILLNESS:  This is a 64 year old female who is for discharge home with Home health PT for endurance, OT for ADLs, SW for community referrals and Nursing for wound care and medication management. DME: standard wheelchair with leg rests, anti-tippers and cushion.  She has been admitted to [facility] on 04/30/15 from [facility]. She has PMH of ESRD, diabetes mellitus type 2, osteoarthritis memory loss, GERD and depression. She was in a store and had a syncopal episode. She was brought to the ED. EKG shows normal sinus rhythm with type I AV block and nonspecific T-wave changes in lateral leads. Orthostatic VS were Positive. Troponin mildly elevated 0.05>0.04>0.06. Repeat EKG with continued non specific T wave changes. Echo showed EF 35% Severe hypokinesis of inferior wall and the septum (new). Wall thickness was increased in a pattern of mild LVH PA peak pressure: 44 mm Hg. Cardiology was consulted in the setting of worsened heart function. Ischemic evaluation was performed. Patient underwent cardiac catheterization, which revealed severe multivessel disease and inferior wall hypokinesis with an EF 45%. Recommendation was ultimately to consider CABG. In the setting of her co-morbidities and recent facial trauma, she was started on medical therapy. Patient's blood pressure and HR did not tolerate BB therapy and in the setting of her ESRD an ACE-I/ARB was not initiated. She was continued only on Imdur, which she tolerated well. She was evaluated by CT surgery, who  also recommended medical therapy as tolerated for now. EP was consulted, who did not recommend a loop recorder at this time but did recommend a Lifevest.   Patient was seen today. She has a Lifevest. The bruise on her face is very minimal but still has the ematoma on right orbital area.  Patient was admitted to this facility for short-term rehabilitation after the patient's recent hospitalization.  Patient has completed SNF rehabilitation and therapy has cleared the patient for discharge.  PAST MEDICAL HISTORY:  Past Medical History  Diagnosis Date  . Hearing difficulty     Mild   . Diabetes mellitus   . Coronary artery disease     a. cath 6/16 Severe multivessel cad with diffuse significant segmental LAD stenosis, 95% stenosis in a large second diagonal, 75% ostial stenosis in the smaller first diagonal, 95% mid and 65% distal circumflex, 95% ostial/proximal PDA, and 40-50% left main ostial disease. Med rx initially, to be re-evaluated by CVTS.  Marland Kitchen ESRD (end stage renal disease) on dialysis        . Hypertension   . Ischemic cardiomyopathy     a. Echo 04/2015: EF 35%. Cath same time - 45%.  . Syncope   . Hyponatremia   . Anemia   . Thrombocytopenia   . Memory loss   . Hyperlipidemia     CURRENT MEDICATIONS: Reviewed per MAR/see medication list  Allergies  Allergen Reactions  . Aleve [Naproxen Sodium]     Due to kidneys.  . Motrin [Ibuprofen]     Due to kidneys.     REVIEW OF SYSTEMS:  GENERAL: no change in appetite, no  fatigue, no weight changes, no fever, chills or weakness RESPIRATORY: no cough, SOB, DOE, wheezing, hemoptysis CARDIAC: no chest pain, or palpitations GI: no abdominal pain, diarrhea, constipation, heart burn, nausea or vomiting  PHYSICAL EXAMINATION  GENERAL: no acute distress, obese SKIN:  Right orbital hematoma, minimal bruising on face NECK: supple, trachea midline, no neck masses, no thyroid tenderness, no thyromegaly LYMPHATICS: no LAN in the  neck, no supraclavicular LAN RESPIRATORY: breathing is even & unlabored, BS CTAB CARDIAC: RRR, no murmur,no extra heart sounds, BLE edema 2+ GI: abdomen soft, normal BS, no masses, no tenderness, no hepatomegaly, no splenomegaly EXTREMITIES:  Able to move 4 extremities; LUE AVF + bruit/thrill PSYCHIATRIC: the patient is alert & oriented to person, affect & behavior appropriate  LABS/RADIOLOGY: Labs reviewed: 05/08/15  WBC 6.6 hemoglobin 10.7 hematocrit 31.2 platelet 983 Basic Metabolic Panel:  Recent Labs  04/25/15 1230  04/26/15 1230 04/27/15 1226 04/29/15 0320  NA 133*  < > 130* 134* 132*  K 3.9  < > 3.7 3.5 4.1  CL 95*  < > 94* 96* 95*  CO2 26  < > 23 26 24   GLUCOSE 91  < > 146* 153* 102*  BUN 27*  < > 39* 22* 41*  CREATININE 4.02*  < > 5.83* 4.08* 6.37*  CALCIUM 8.3*  < > 8.6* 8.4* 9.1  PHOS 3.5  --  4.2  --  4.2  < > = values in this interval not displayed. Liver Function Tests:  Recent Labs  04/26/15 0305 04/26/15 1230 04/29/15 0320  AST 28  --   --   ALT 19  --   --   ALKPHOS 112  --   --   BILITOT 1.2  --   --   PROT 6.1*  --   --   ALBUMIN 3.2* 3.1* 3.2*    Recent Labs  04/27/15 1124  AMMONIA 39*   CBC:  Recent Labs  06/12/14 1045 11/18/14 0850  04/26/15 0305 04/26/15 1230 04/29/15 0320  WBC 8.5 6.6  < > 6.7 5.7 6.7  NEUTROABS 6.4 5.0  --   --   --   --   HGB 11.3* 13.1  < > 11.0* 10.3* 11.2*  HCT 32.6* 41.0  < > 32.8* 30.6* 33.7*  MCV 99.1 104.9*  < > 100.6* 100.7* 100.9*  PLT 247 120*  < > 115* 116* 126*  < > = values in this interval not displayed.  Lipid Panel:  Recent Labs  06/12/14 1045  HDL 51   Cardiac Enzymes:  Recent Labs  04/23/15 1500 04/23/15 1939 04/24/15 0222  TROPONINI 0.05* 0.06* 0.05*   CBG:  Recent Labs  04/30/15 0750 04/30/15 1119 04/30/15 1612  GLUCAP 106* 167* 98    Dg Chest 2 View  04/23/2015   CLINICAL DATA:  Fall with right hip pain.  Shortness of breath.  EXAM: CHEST  2 VIEW  COMPARISON:   02/21/2013  FINDINGS: The heart is enlarged. Diffuse reticular opacities suggesting pulmonary edema. No confluent airspace disease. No pleural effusion. No pneumothorax. No acute osseous abnormalities are seen. Hyperdensity in the left upper abdomen is likely related to ingested material.  IMPRESSION: Cardiomegaly and pulmonary edema.  Correlation for CHF recommended.   Electronically Signed   By: Jeb Levering M.D.   On: 04/23/2015 00:05   Ct Head Wo Contrast  04/22/2015   CLINICAL DATA:  Unwitnessed fall with large right orbital hematoma, initial encounter  EXAM: CT HEAD WITHOUT CONTRAST  CT MAXILLOFACIAL WITHOUT  CONTRAST  CT CERVICAL SPINE WITHOUT CONTRAST  TECHNIQUE: Multidetector CT imaging of the head, cervical spine, and maxillofacial structures were performed using the standard protocol without intravenous contrast. Multiplanar CT image reconstructions of the cervical spine and maxillofacial structures were also generated.  COMPARISON:  None.  FINDINGS: CT HEAD FINDINGS  Bony calvarium is intact. A large soft tissue hematoma is noted about the right orbit and right cheek consistent with the patient's recent injury. Mild atrophic changes are noted. No findings to suggest acute hemorrhage, acute infarction or space-occupying mass lesion are noted.  CT MAXILLOFACIAL FINDINGS  Significant motion artifact is identified limiting the exam. No definitive fracture is seen. Significant soft tissue swelling is again noted in the region of the right cheek and right orbit. A focal 2.5 cm hematoma with a fluid fluid level is noted just lateral to the right orbit. The orbits and their contents are within normal limits. Dental caries are noted. No other focal abnormality is seen.  CT CERVICAL SPINE FINDINGS  Seven cervical segments are well visualized. Multilevel disc space narrowing is noted with mild associated osteophytic changes. Facet hypertrophic changes are seen. No acute fracture or acute facet abnormality is  noted. The surrounding soft tissues are within normal limits.  IMPRESSION: CT of the head: Atrophic changes without acute intracranial abnormality.  Significant right facial hematoma.  CT of the maxillofacial bones: Considerable right facial soft tissue changes consistent with a recent injury. A focal 2.5 cm hematoma is noted just lateral to the right orbit. No acute fracture is identified.  CT of the cervical spine: Multilevel degenerative change without acute abnormality.   Electronically Signed   By: Inez Catalina M.D.   On: 04/22/2015 22:53   Ct Cervical Spine Wo Contrast  04/22/2015   CLINICAL DATA:  Unwitnessed fall with large right orbital hematoma, initial encounter  EXAM: CT HEAD WITHOUT CONTRAST  CT MAXILLOFACIAL WITHOUT CONTRAST  CT CERVICAL SPINE WITHOUT CONTRAST  TECHNIQUE: Multidetector CT imaging of the head, cervical spine, and maxillofacial structures were performed using the standard protocol without intravenous contrast. Multiplanar CT image reconstructions of the cervical spine and maxillofacial structures were also generated.  COMPARISON:  None.  FINDINGS: CT HEAD FINDINGS  Bony calvarium is intact. A large soft tissue hematoma is noted about the right orbit and right cheek consistent with the patient's recent injury. Mild atrophic changes are noted. No findings to suggest acute hemorrhage, acute infarction or space-occupying mass lesion are noted.  CT MAXILLOFACIAL FINDINGS  Significant motion artifact is identified limiting the exam. No definitive fracture is seen. Significant soft tissue swelling is again noted in the region of the right cheek and right orbit. A focal 2.5 cm hematoma with a fluid fluid level is noted just lateral to the right orbit. The orbits and their contents are within normal limits. Dental caries are noted. No other focal abnormality is seen.  CT CERVICAL SPINE FINDINGS  Seven cervical segments are well visualized. Multilevel disc space narrowing is noted with mild  associated osteophytic changes. Facet hypertrophic changes are seen. No acute fracture or acute facet abnormality is noted. The surrounding soft tissues are within normal limits.  IMPRESSION: CT of the head: Atrophic changes without acute intracranial abnormality.  Significant right facial hematoma.  CT of the maxillofacial bones: Considerable right facial soft tissue changes consistent with a recent injury. A focal 2.5 cm hematoma is noted just lateral to the right orbit. No acute fracture is identified.  CT of the cervical spine: Multilevel  degenerative change without acute abnormality.   Electronically Signed   By: Inez Catalina M.D.   On: 04/22/2015 22:53   Dg Hip Unilat With Pelvis 2-3 Views Right  04/23/2015   CLINICAL DATA:  Fall, RIGHT hip pain.  EXAM: RIGHT HIP (WITH PELVIS) 2-3 VIEWS  COMPARISON:  None.  FINDINGS: There is no evidence of hip fracture or dislocation. There is mild RIGHT hip joint space narrowing. No pelvic fracture. Fecal impaction.  IMPRESSION: No acute hip fracture is evident.  Degenerative change.   Electronically Signed   By: Rolla Flatten M.D.   On: 04/23/2015 00:08   Ct Maxillofacial Wo Cm  04/22/2015   CLINICAL DATA:  Unwitnessed fall with large right orbital hematoma, initial encounter  EXAM: CT HEAD WITHOUT CONTRAST  CT MAXILLOFACIAL WITHOUT CONTRAST  CT CERVICAL SPINE WITHOUT CONTRAST  TECHNIQUE: Multidetector CT imaging of the head, cervical spine, and maxillofacial structures were performed using the standard protocol without intravenous contrast. Multiplanar CT image reconstructions of the cervical spine and maxillofacial structures were also generated.  COMPARISON:  None.  FINDINGS: CT HEAD FINDINGS  Bony calvarium is intact. A large soft tissue hematoma is noted about the right orbit and right cheek consistent with the patient's recent injury. Mild atrophic changes are noted. No findings to suggest acute hemorrhage, acute infarction or space-occupying mass lesion are noted.   CT MAXILLOFACIAL FINDINGS  Significant motion artifact is identified limiting the exam. No definitive fracture is seen. Significant soft tissue swelling is again noted in the region of the right cheek and right orbit. A focal 2.5 cm hematoma with a fluid fluid level is noted just lateral to the right orbit. The orbits and their contents are within normal limits. Dental caries are noted. No other focal abnormality is seen.  CT CERVICAL SPINE FINDINGS  Seven cervical segments are well visualized. Multilevel disc space narrowing is noted with mild associated osteophytic changes. Facet hypertrophic changes are seen. No acute fracture or acute facet abnormality is noted. The surrounding soft tissues are within normal limits.  IMPRESSION: CT of the head: Atrophic changes without acute intracranial abnormality.  Significant right facial hematoma.  CT of the maxillofacial bones: Considerable right facial soft tissue changes consistent with a recent injury. A focal 2.5 cm hematoma is noted just lateral to the right orbit. No acute fracture is identified.  CT of the cervical spine: Multilevel degenerative change without acute abnormality.   Electronically Signed   By: Inez Catalina M.D.   On: 04/22/2015 22:53    ASSESSMENT/PLAN:  Physical deconditioning - for home health PT, OT, SW and Nursing Syncope -  Coreg was discontinued per nephrology due to bradycardia Severe multivessel coronary disease - follow-up with cardiology for posssible staged PCI vs ICD vs CABG; medical management once she recovers from trauma from head and neck ; continue ASA 81 mg PO Q D and Imdur 15 mg PO Q D Constipation - continue Colace 100 mg 1 capsule by mouth daily when necessary Diabetes mellitus, type II - continue Lantus 30 units subcutaneous daily at bedtime GERD - continue Reglan 5 mg by mouth daily and pantoprazole 40 mg by mouth daily  Depression - venlafaxine was tapered off due to multiple falls; mood is stable and looking  forward to going home ESRD - continue HD Monday-Wednesday-Friday    I have filled out patient's discharge paperwork and written prescriptions.  Patient will receive home health PT, OT, SW and Nursing.  DME provided:  standard wheelchair with leg  rests, anti-tippers and cushion  Total discharge time: Greater than 30 minutes  Discharge time involved coordination of the discharge process with Education officer, museum, nursing staff and therapy department. Medical justification for home health services/DME verified.    [place], NP [employer] [phone]

## 2015-05-14 ENCOUNTER — Encounter: Payer: BLUE CROSS/BLUE SHIELD | Admitting: Physician Assistant

## 2015-05-14 NOTE — Progress Notes (Signed)
This encounter was created in error - please disregard.

## 2015-05-28 ENCOUNTER — Ambulatory Visit (INDEPENDENT_AMBULATORY_CARE_PROVIDER_SITE_OTHER): Payer: Medicare Other | Admitting: Nurse Practitioner

## 2015-05-28 ENCOUNTER — Encounter: Payer: Self-pay | Admitting: Nurse Practitioner

## 2015-05-28 VITALS — BP 140/90 | HR 56 | Ht 65.0 in | Wt 197.8 lb

## 2015-05-28 DIAGNOSIS — R55 Syncope and collapse: Secondary | ICD-10-CM

## 2015-05-28 DIAGNOSIS — I255 Ischemic cardiomyopathy: Secondary | ICD-10-CM | POA: Diagnosis not present

## 2015-05-28 DIAGNOSIS — I259 Chronic ischemic heart disease, unspecified: Secondary | ICD-10-CM

## 2015-05-28 DIAGNOSIS — N186 End stage renal disease: Secondary | ICD-10-CM | POA: Diagnosis not present

## 2015-05-28 NOTE — Patient Instructions (Addendum)
We will be checking the following labs today - NONE   Medication Instructions:    Continue with your current medicines.     Testing/Procedures To Be Arranged:  N/A  Follow-Up:   See Dr. Tamala Julian in 6 weeks    Other Special Instructions:   May send the LifeVest back  No driving  Call the [place] office at [phone] if you have any questions, problems or concerns.

## 2015-05-28 NOTE — Progress Notes (Signed)
CARDIOLOGY OFFICE NOTE  Date:  05/28/2015    [name] [name] Date of Birth: 04/28/1951 Medical Record #[id number]  PCP:  Smiley Houseman, MD  Cardiologist:  Clearence Cheek    Chief Complaint  Patient presents with  . FU for ICM, syncope with facial fractures.    Seen for Dr. Tamala Julian & Lovena Le. Post hospital visit.     History of Present Illness: [name] [name] is a 64 y.o. female who presents today for a post hospital visit. this was to be a TOC - however she is outside the timeframe. PMH is significant for ESRD (on HD MWF, s/p R-nephrectomy), DM2, OA knees, memory loss (fam h/o alzheimer's), GERD, depression.  She presented with syncopal episode back in June. This was unwitnessed & reportedly with  LOC found by bystander who called EMS. This resulted in significant facial trauma/fracture.   In the ED, CT head was negative for acute fracture or hemorrhage. EKG with NSR with Type 1 AV block and nonspecific T wave changes in lateral leads. Patient was admitted.  Orthostatic VS were Positive. Troponin mildly elevated 0.05>0.04>0.06. Repeat EKG with continued non specific T wave changes. Echo showed EF 35% Severe hypokinesis of inferior wall and the septum (new). Wall thickness was increased in a pattern of mild LVH PA peak pressure: 44 mm Hg. Cardiology was consulted in the setting of worsened heart function. Ischemic evaluation was performed. Patient underwent cardiac catheterization, which revealed severe multivessel disease and inferior wall hypokinesis with an EF 45%. Recommendation was ultimately to consider CABG. In the setting of her co-morbidities and recent facial trauma, she was started on medical therapy. Patient's blood pressure and HR did not tolerate BB therapy and in the setting of her ESRD an ACE-I/ARB was not initiated. She was continued only on Imdur, which she tolerated well. She was evaluated by CT surgery, who also recommended medical therapy as  tolerated for now. EP was consulted, who did not recommend a loop recorder at this time but did recommend a Lifevest. her Effexor was tapered during this admission and was to be discontinued.   She was discharged to [facility] for rehab.   Comes in today. Here with her sister who is visiting from [place]. She has been home about 2 weeks. Husband was admitted the day she came home. She feels like she is doing ok. Dialysis going ok. No chest pain. Not short of breath. Did have her Life Vest on - she stopped wearing it about 2 weeks ago due to skin irritation and constantly alarming with dialysis. She remains on her Effexor - was not aware of need to taper and stop. Long standing depression. Some DOE but she feels like overall she is doing "ok".  Past Medical History  Diagnosis Date  . Hearing difficulty     Mild   . Diabetes mellitus   . Coronary artery disease     a. cath 6/16 Severe multivessel cad with diffuse significant segmental LAD stenosis, 95% stenosis in a large second diagonal, 75% ostial stenosis in the smaller first diagonal, 95% mid and 65% distal circumflex, 95% ostial/proximal PDA, and 40-50% left main ostial disease. Med rx initially, to be re-evaluated by CVTS.  Marland Kitchen ESRD (end stage renal disease) on dialysis        . Hypertension   . Ischemic cardiomyopathy     a. Echo 04/2015: EF 35%. Cath same time - 45%.  . Syncope   . Hyponatremia   . Anemia   .  Thrombocytopenia   . Memory loss   . Hyperlipidemia     Past Surgical History  Procedure Laterality Date  . 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 Esophagogastroduodenoscopy Left 02/20/2013    Procedure: ESOPHAGOGASTRODUODENOSCOPY (EGD);  Surgeon: Cleotis Nipper, MD;  Location: [facility] ENDOSCOPY;  Service: Endoscopy;  Laterality: Left;  . Av fistula placement Left 03/07/2013    Procedure: ARTERIOVENOUS (AV) FISTULA CREATION;  Surgeon: Elam Dutch, MD;  Location: [facility] OR;  Service: Vascular;  Laterality:  Left;  left brachiocephalic AVF  . Cardiac catheterization N/A 04/25/2015    Procedure: Left Heart Cath and Coronary Angiography;  Surgeon: Belva Crome, MD;  Location: [facility] CV LAB;  Service: Cardiovascular;  Laterality: N/A;     Medications: Current Outpatient Prescriptions  Medication Sig Dispense Refill  . acetaminophen (TYLENOL) 500 MG tablet Take 500 mg by mouth every 6 (six) hours as needed for headache.    . calcium carbonate (OS-CAL - DOSED IN MG OF ELEMENTAL CALCIUM) 1250 MG tablet TAKE 1 TABLET BY MOUTH THREE TIMES DAILY WITH MEALS (Patient taking differently: TAKE 1 TABLET BY MOUTH THREE TIMES DAILY WITH MEALS TAKE TWO TABLETS WITH SNACKS) 100 tablet 0  . docusate sodium (DOCQLACE) 100 MG capsule Take 1 capsule (100 mg total) by mouth daily as needed. (Patient taking differently: Take 100 mg by mouth daily as needed for mild constipation (CONSTIPATION). ) 30 capsule 3  . insulin glargine (LANTUS) 100 UNIT/ML injection Inject 0.03 mLs (3 Units total) into the skin at bedtime. 10 mL 6  . ISOSORBIDE MONONITRATE PO Take 15 mg by mouth daily.     . metoCLOPramide (REGLAN) 5 MG tablet TAKE 1 TABLET BY MOUTH DAILY BEFORE BREAKFAST 30 tablet 11  . multivitamin (RENA-VIT) TABS tablet Take 1 tablet by mouth at bedtime. 90 tablet 3  . pantoprazole (PROTONIX) 40 MG tablet Take 1 tablet (40 mg total) by mouth daily. 30 tablet 1  . promethazine (PHENERGAN) 25 MG tablet Take 1 tablet (25 mg total) by mouth every 6 (six) hours as needed for nausea. 30 tablet 1  . venlafaxine XR (EFFEXOR-XR) 37.5 MG 24 hr capsule Take 1 capsule (37.5 mg total) by mouth daily with breakfast. Last dose 05/05/15 5 capsule 0   No current facility-administered medications for this visit.    Allergies: Allergies  Allergen Reactions  . Aleve [Naproxen Sodium]     Due to kidneys.  . Motrin [Ibuprofen]     Due to kidneys.    Social History: The patient  reports that she has never smoked. She has never used  smokeless tobacco. She reports that she does not drink alcohol or use illicit drugs.   Family History: The patient's family history includes Bone cancer in her father; Diabetes in an other family member; Heart disease in an other family member; Stroke in an other family member; Throat cancer in her father.   Review of Systems: Please see the history of present illness.   Otherwise, the review of systems is positive for none.   All other systems are reviewed and negative.   Physical Exam: VS:  BP 140/90 mmHg  Pulse 56  Ht 5\' 5"  (1.651 m)  Wt 197 lb 12.8 oz (89.721 kg)  BMI 32.92 kg/m2 .  BMI Body mass index is 32.92 kg/(m^2).  Wt Readings from Last 3 Encounters:  05/28/15 197 lb 12.8 oz (89.721 kg)  05/13/15 197 lb 9.6 oz (89.631 kg)  05/03/15 200 lb (  90.719 kg)    General: Pleasant. She is alert and in no acute distress. She looks much older than her stated age.  HEENT: Normal. Resolving contusion over right eye noted.  Neck: Supple, no JVD, carotid bruits, or masses noted.  Cardiac: Regular rate and rhythm. No murmurs, rubs, or gallops. No edema.  Respiratory:  Lungs are clear to auscultation bilaterally with normal work of breathing.  GI: Soft and nontender.  MS: No deformity or atrophy. Gait not tested. Skin: Warm and dry. She has vitiligo.   Neuro:  Strength and sensation are intact and no gross focal deficits noted.  Psych: Alert, appropriate and with normal affect.        LABORATORY DATA:  EKG:  EKG is not ordered today.   Lab Results  Component Value Date   WBC 6.7 04/29/2015   HGB 11.2* 04/29/2015   HCT 33.7* 04/29/2015   PLT 126* 04/29/2015   GLUCOSE 102* 04/29/2015   CHOL 191 06/12/2014   TRIG 115 06/12/2014   HDL 51 06/12/2014   LDLCALC 117* 06/12/2014   ALT 19 04/26/2015   AST 28 04/26/2015   NA 132* 04/29/2015   K 4.1 04/29/2015   CL 95* 04/29/2015   CREATININE 6.37* 04/29/2015   BUN 41* 04/29/2015   CO2 24 04/29/2015   TSH 1.974 04/25/2015    INR 1.14 04/25/2015   HGBA1C 6.1 06/12/2014    BNP (last 3 results)  Recent Labs  04/23/15 0422  BNP 3990.5*   Lab Results  Component Value Date   CKTOTAL 26 03/11/2011   CKMB 0.2* 03/11/2011   TROPONINI 0.05* 04/24/2015    ProBNP (last 3 results) No results for input(s): PROBNP in the last 8760 hours.   Other Studies Reviewed Today: 2D Echo: Study Conclusions - Left ventricle: Wall motion abnormalities are new since study of 2014. Severe hypokinesis of inferior wall and the septum. The cavity size was normal. Wall thickness was increased in a pattern of mild LVH. The estimated ejection fraction was 35%. - Aortic valve: Sclerosis without stenosis. There was no significant regurgitation. - Left atrium: The atrium was moderately dilated. - Right ventricle: The cavity size was mildly dilated. Systolic function was moderately reduced. - Tricuspid valve: There was mild-moderate regurgitation. - Pulmonary arteries: PA peak pressure: 44 mm Hg (S).  Cardiac catheterization: 3. Dist Cx lesion, 65% stenosed. 4. Prox Cx to Mid Cx lesion, 95% stenosed. 5. RPDA lesion, 95% stenosed. 6. Prox RCA to Dist RCA lesion, 60% stenosed. 7. Ost 1st Diag to 1st Diag lesion, 70% stenosed. 8. 2nd Diag lesion, 90% stenosed. 9. Mid LAD to Dist LAD lesion, 85% stenosed. 10. Ost LM to LM lesion, 50% stenosed.  Impression:  Severe multivessel coronary disease with diffuse significant segmental LAD stenosis, 95% stenosis in a large second diagonal, 75% ostial stenosis in the smaller first diagonal, 95% mid and 65% distal circumflex, 95% ostial/proximal PDA, and 40-50% left main ostial disease.  Inferior wall hypokinesis with EF of 45%   RECOMMENDATIONS:   TCTS consult to consider coronary bypass grafting once patient is over the recent severe head trauma related to syncope  Long-term monitoring to rule out ischemic arrhythmias either bradycardia or tachycardia.  Dialysis has  per nephrology.   Assessment/Plan:  1. Syncope - Unclear etiology. Newly diagnosed cardiomyopathy and severe multivessel CAD.  - No CABG per TCTS. Not on DAPT due to recent trauma. Only on aspirin therapy.  - LifeVest was placed but she has stopped.   - Discussed  with Dr. Tamala Julian about possible staged PCI vs ICD vs medical management once recover from acute injuries. She has improved with medical therapy - I have talked with Dr. Tamala Julian here in the office - he would like to continue with her medical therapy - see her back in about 6 weeks and then decide about her management.   - Continue ASA, imdur -  2. CAD - Presented with syncope.  - As above - Unable to participate in cardiac rehab. Encouragement given.   3. CRF - per nephrology - on dialysis  4. DM  5. Depression - back on Effexor  6. Life Vest - discussed with Dr. Tamala Julian - she would be high risk for ICD implant due to her comorbidities - ok to send back - she has not worn for the last 2 weeks due to skin irritation.  7. ICM - some DOE but overall felt to be stable at this time.   Her overall prognosis is quite tenuous.   Current medicines are reviewed with the patient today.  The patient does not have concerns regarding medicines other than what has been noted above.  The following changes have been made:  See above.  Labs/ tests ordered today include:   No orders of the defined types were placed in this encounter.     Disposition:   FU with Dr. Tamala Julian in 6 weeks.   Patient is agreeable to this plan and will call if any problems develop in the interim.   Signed: Burtis Junes, RN, ANP-C 05/28/2015 2:48 PM  [facility] [address] [place], [place]  [address] Phone: [phone] Fax: [phone]

## 2015-06-15 ENCOUNTER — Other Ambulatory Visit: Payer: Self-pay | Admitting: Adult Health

## 2015-06-19 ENCOUNTER — Telehealth: Payer: Self-pay

## 2015-06-19 NOTE — Telephone Encounter (Signed)
Signed home health order for [facility] placed in MR in nurse fax box

## 2015-06-25 ENCOUNTER — Encounter (HOSPITAL_BASED_OUTPATIENT_CLINIC_OR_DEPARTMENT_OTHER): Payer: Medicare Other | Attending: General Surgery

## 2015-06-25 DIAGNOSIS — I87333 Chronic venous hypertension (idiopathic) with ulcer and inflammation of bilateral lower extremity: Secondary | ICD-10-CM | POA: Diagnosis not present

## 2015-06-25 DIAGNOSIS — K219 Gastro-esophageal reflux disease without esophagitis: Secondary | ICD-10-CM | POA: Insufficient documentation

## 2015-06-25 DIAGNOSIS — M069 Rheumatoid arthritis, unspecified: Secondary | ICD-10-CM | POA: Diagnosis not present

## 2015-06-25 DIAGNOSIS — Z992 Dependence on renal dialysis: Secondary | ICD-10-CM | POA: Insufficient documentation

## 2015-06-25 DIAGNOSIS — M170 Bilateral primary osteoarthritis of knee: Secondary | ICD-10-CM | POA: Diagnosis not present

## 2015-06-25 DIAGNOSIS — L97811 Non-pressure chronic ulcer of other part of right lower leg limited to breakdown of skin: Secondary | ICD-10-CM | POA: Diagnosis not present

## 2015-06-25 DIAGNOSIS — R413 Other amnesia: Secondary | ICD-10-CM | POA: Insufficient documentation

## 2015-06-25 DIAGNOSIS — Z794 Long term (current) use of insulin: Secondary | ICD-10-CM | POA: Diagnosis not present

## 2015-06-25 DIAGNOSIS — N186 End stage renal disease: Secondary | ICD-10-CM | POA: Diagnosis not present

## 2015-06-25 DIAGNOSIS — E11622 Type 2 diabetes mellitus with other skin ulcer: Secondary | ICD-10-CM | POA: Diagnosis not present

## 2015-06-25 DIAGNOSIS — E1122 Type 2 diabetes mellitus with diabetic chronic kidney disease: Secondary | ICD-10-CM | POA: Diagnosis not present

## 2015-06-25 DIAGNOSIS — L97821 Non-pressure chronic ulcer of other part of left lower leg limited to breakdown of skin: Secondary | ICD-10-CM | POA: Diagnosis not present

## 2015-06-25 DIAGNOSIS — Z905 Acquired absence of kidney: Secondary | ICD-10-CM | POA: Insufficient documentation

## 2015-06-25 DIAGNOSIS — I2510 Atherosclerotic heart disease of native coronary artery without angina pectoris: Secondary | ICD-10-CM | POA: Insufficient documentation

## 2015-06-25 DIAGNOSIS — I120 Hypertensive chronic kidney disease with stage 5 chronic kidney disease or end stage renal disease: Secondary | ICD-10-CM | POA: Insufficient documentation

## 2015-06-25 LAB — GLUCOSE, CAPILLARY: GLUCOSE-CAPILLARY: 88 mg/dL (ref 65–99)

## 2015-06-27 ENCOUNTER — Other Ambulatory Visit: Payer: Self-pay | Admitting: Adult Health

## 2015-07-02 DIAGNOSIS — I87333 Chronic venous hypertension (idiopathic) with ulcer and inflammation of bilateral lower extremity: Secondary | ICD-10-CM | POA: Diagnosis not present

## 2015-07-02 DIAGNOSIS — I2510 Atherosclerotic heart disease of native coronary artery without angina pectoris: Secondary | ICD-10-CM | POA: Diagnosis not present

## 2015-07-02 DIAGNOSIS — L97811 Non-pressure chronic ulcer of other part of right lower leg limited to breakdown of skin: Secondary | ICD-10-CM | POA: Diagnosis not present

## 2015-07-02 DIAGNOSIS — L97821 Non-pressure chronic ulcer of other part of left lower leg limited to breakdown of skin: Secondary | ICD-10-CM | POA: Diagnosis not present

## 2015-07-05 ENCOUNTER — Telehealth: Payer: Self-pay | Admitting: Interventional Cardiology

## 2015-07-05 NOTE — Telephone Encounter (Signed)
Returned call to [employer]. Verbal order given

## 2015-07-05 NOTE — Telephone Encounter (Signed)
New message     They received a request for nursing to do dx management, education, safety falls.  They want to see her 1w9 and 3 prn visits.  Now she needs a verbal order.

## 2015-07-09 ENCOUNTER — Telehealth: Payer: Self-pay | Admitting: Interventional Cardiology

## 2015-07-09 DIAGNOSIS — L97811 Non-pressure chronic ulcer of other part of right lower leg limited to breakdown of skin: Secondary | ICD-10-CM | POA: Diagnosis not present

## 2015-07-09 DIAGNOSIS — I87333 Chronic venous hypertension (idiopathic) with ulcer and inflammation of bilateral lower extremity: Secondary | ICD-10-CM | POA: Diagnosis not present

## 2015-07-09 DIAGNOSIS — L97821 Non-pressure chronic ulcer of other part of left lower leg limited to breakdown of skin: Secondary | ICD-10-CM | POA: Diagnosis not present

## 2015-07-09 DIAGNOSIS — I2510 Atherosclerotic heart disease of native coronary artery without angina pectoris: Secondary | ICD-10-CM | POA: Diagnosis not present

## 2015-07-09 NOTE — Telephone Encounter (Signed)
New message      [name] [name] called [employer] to tell them that pt is refusing home health care.  They want Dr Tamala Julian to know

## 2015-07-09 NOTE — Telephone Encounter (Signed)
F/u   Need verbal orders for Occupational Therapy. Please advise

## 2015-07-09 NOTE — Telephone Encounter (Signed)
Noted. update fwd to Dr.Smith

## 2015-07-11 NOTE — Telephone Encounter (Signed)
Per [facility] pt  Has refuse home health services.  Message received from [facility] requesting an order for OT.  Called pt to see if she wants to proceed with therapy. Unable to lmom pt phone busy

## 2015-07-11 NOTE — Telephone Encounter (Signed)
Returned call to [facility] @ [facility]. lmom pt has refused homehealth service. I have been trying to reach the pt to see if she is agreeable to have OT If pt is agreeable to have OT. Then it is ok. She is to call back if further assistance is needed

## 2015-07-11 NOTE — Telephone Encounter (Signed)
Follow up   [name] [name], is calling back to speak to RN or Dr. Mare Ferrari

## 2015-07-12 ENCOUNTER — Telehealth: Payer: Self-pay | Admitting: Interventional Cardiology

## 2015-07-12 NOTE — Telephone Encounter (Signed)
New message    Nurse stated: Pt b/p was elevated on 8-23  150/100 Pt has no other signs or symptoms

## 2015-07-12 NOTE — Telephone Encounter (Signed)
Spoke with Mallory (occupational therapist). She saw pt on 07/29/2015 and blood pressure was 150/100. Pt denied any complaints at this time.  She report's pt husband passed away this week (after July 29, 2015) so no one else has been out to see her as pt has declined home visits this week.  Pt resides at [facility] in independent living section.  Pt is scheduled to see Dr. Tamala Julian on 07/18/15

## 2015-07-17 ENCOUNTER — Ambulatory Visit: Payer: Medicare Other | Admitting: Interventional Cardiology

## 2015-07-18 ENCOUNTER — Ambulatory Visit (INDEPENDENT_AMBULATORY_CARE_PROVIDER_SITE_OTHER): Payer: Medicare Other | Admitting: Interventional Cardiology

## 2015-07-18 DIAGNOSIS — E119 Type 2 diabetes mellitus without complications: Secondary | ICD-10-CM

## 2015-07-18 DIAGNOSIS — N186 End stage renal disease: Secondary | ICD-10-CM

## 2015-07-18 DIAGNOSIS — I2510 Atherosclerotic heart disease of native coronary artery without angina pectoris: Secondary | ICD-10-CM

## 2015-07-18 NOTE — Progress Notes (Signed)
No show

## 2015-07-23 ENCOUNTER — Telehealth: Payer: Self-pay | Admitting: *Deleted

## 2015-07-23 NOTE — Telephone Encounter (Signed)
I agree with advice given to patient. She needs to be seen at clinic or in the ED.

## 2015-07-23 NOTE — Telephone Encounter (Signed)
Malorie, Warden/ranger with [facility] called stating patient's blood pressure at rest 140/110 and 160/120s with minimal activity.  She had patient to rest for about 10 minutes but blood pressure did not increase.  Spoke with patient, patient stated she felt fine just sitting watching TV.  Patient denies any chest pain, SOB, dizziness, headache or n/v.  Advised patient she should be seen, offered an appointment for tomorrow, patient declined leaving for [facility] in the morning and will not return until this weekend.  Patient told to go to ED for evaluation of BP.  Will forward to PCP.  Derl Barrow, RN

## 2015-07-23 NOTE — Telephone Encounter (Signed)
Spoke with Malorie this afternoon to inform her that I spoke with patient this morning about her blood pressures.  Per Malorie patient was going to dialysis and not out of town. She spoke with patient this afternoon around 3:30 to check if patient spoke with someone at [facility]. Patient told her that she did not hear anything from this office.  Malorie was going to call patient back to advise her that she need to make an appointment for blood pressure evaluation or go to ED.  Derl Barrow, RN

## 2015-07-25 ENCOUNTER — Telehealth: Payer: Self-pay | Admitting: Internal Medicine

## 2015-07-25 NOTE — Telephone Encounter (Signed)
LM for Mallory from [facility] to call back in reference to pt BP. Need to know how long has BP been elevated and if pt is having any sx such as headaches, dizziness, SOB or blurry vision. Qualyn Oyervides, CMA.

## 2015-07-25 NOTE — Telephone Encounter (Signed)
Her BP is 160/110 Yesterday at dialysiis, it was ok

## 2015-07-26 NOTE — Telephone Encounter (Signed)
Spoke with Emmaline Kluver from [facility] and she stated that pt BP was elevated and had called earlier this week stating it was elevated then as well and that when she went to dialysis pt was told her BP was ok. Malloy thinks BP has been elevated up and down. I asked Emmaline Kluver if pt had any sx of HA's, dizziness, SOB and she stated she did not say. Called pt to see if BP has been elevated and LMTCB with family member to callback. Need to see if pt BP is still elevated and if she is having any sx. Will try again later. Kaylana Fenstermacher, CMA.

## 2015-07-29 ENCOUNTER — Telehealth: Payer: Self-pay | Admitting: Interventional Cardiology

## 2015-07-29 NOTE — Telephone Encounter (Signed)
New message    [name] [name] states: Pt has already been seen and evaluated for home health therapy  Pt will be seen 1 time a week for 3 weeks and then pt will be seen 2 times a week for 3 more weeks Needs a verbal orders for this therapy

## 2015-07-31 NOTE — Telephone Encounter (Signed)
PCP if she has one

## 2015-07-31 NOTE — Telephone Encounter (Signed)
Left message for Verdis Frederickson to call back to instruct her to call PCP for orders.

## 2015-07-31 NOTE — Telephone Encounter (Signed)
Verdis Frederickson PT with [facility] aware of Dr.Smith's response. Verbal order rqst for PT should go to pt PCP

## 2015-08-01 ENCOUNTER — Telehealth: Payer: Self-pay | Admitting: Internal Medicine

## 2015-08-01 NOTE — Telephone Encounter (Signed)
Needs verbal orders for home health for 1 time per week for 3 weeks and then 2 times per week for 3 weeks for transfers, balance and gait training

## 2015-08-01 NOTE — Telephone Encounter (Signed)
Will forward to MD. Chadd Tollison,CMA  

## 2015-08-01 NOTE — Telephone Encounter (Signed)
Reviewing the chart, patient has not been seen in clinic since 06/2014. Could you call patient to verify if she still has [place] for insurance or Medicare, and if home health request is new (or does she need a renewal for [facility]). If home health is a new request, I believe she will need to be seen in clinic before I am able to order this for her. Thank you.

## 2015-08-02 NOTE — Telephone Encounter (Signed)
Spoke with [name] [name] with home health and patient is already established with them and receiving services.  She has [employer] medic[employer] for her insurance.  Patient has an appt on 08-06-15 but will need the verbal ok to continue services before then.  [name] [name],CMA

## 2015-08-03 NOTE — Telephone Encounter (Signed)
Thank you for checking. Then I am approving to continue these services for this patient.

## 2015-08-05 NOTE — Telephone Encounter (Signed)
Spoke with Verdis Frederickson and confirmed patient's appt tomorrow with PCP and informed her that verbal ok was given for orders. Jazmin Hartsell,CMA

## 2015-08-06 ENCOUNTER — Ambulatory Visit (INDEPENDENT_AMBULATORY_CARE_PROVIDER_SITE_OTHER): Payer: Medicare Other | Admitting: Internal Medicine

## 2015-08-06 ENCOUNTER — Encounter: Payer: Self-pay | Admitting: Internal Medicine

## 2015-08-06 VITALS — BP 142/88 | HR 60 | Temp 98.0°F | Ht 64.5 in | Wt 186.2 lb

## 2015-08-06 DIAGNOSIS — E119 Type 2 diabetes mellitus without complications: Secondary | ICD-10-CM | POA: Diagnosis not present

## 2015-08-06 DIAGNOSIS — Z0000 Encounter for general adult medical examination without abnormal findings: Secondary | ICD-10-CM

## 2015-08-06 LAB — POCT GLYCOSYLATED HEMOGLOBIN (HGB A1C): HEMOGLOBIN A1C: 5.4

## 2015-08-06 MED ORDER — PANTOPRAZOLE SODIUM 40 MG PO TBEC: 40.0000 mg | DELAYED_RELEASE_TABLET | Freq: Every day | ORAL | Status: DC

## 2015-08-06 MED ORDER — ISOSORBIDE MONONITRATE 10 MG PO TABS: 15.0000 mg | ORAL_TABLET | Freq: Every day | ORAL | Status: DC

## 2015-08-06 MED ORDER — INSULIN GLARGINE 100 UNIT/ML ~~LOC~~ SOLN: 3.0000 [IU] | Freq: Every day | SUBCUTANEOUS | Status: DC

## 2015-08-06 NOTE — Patient Instructions (Signed)
Thank you for coming in today. I would like to see you in about 2-3 weeks to discuss your Diabetes and blood pressure. Make sure you go to your cardiology appointment next week. I sent refills to your pharmacy.

## 2015-08-06 NOTE — Progress Notes (Signed)
Patient ID: [name] [name], female   DOB: September 01, 1951, 64 y.o.   MRN: [id number]    Subjective:  Chief complaint -- Annual Physical Patient is here for annual physical. Patient is with her two sisters. Patient and sisters had questions and asked for clarification of medication patient needs to be taking. Per chart review, patient did not have a hospital follow up visit after her hospitalization in June 2016.  Per chart review, patient was admitted in June 2016 for a syncopal episode which resulted in facial trauma. Cardiology was consulted after ECHO showed worsening heart function. Patient underwent cardiac catheterization which reveals severe multivessel disease and inferior wall hypokinesis with an EF 45%. Recommendation was ultimately to consider CABG. In the setting of her co-morbidities and recent facial trauma, she was started on medical therapy. Patient's blood pressure and HR did not tolerate BB therapy and in the setting of her ESRD an ACE-I/ARB was not initiated. She was continued only on Imdur, which she tolerated well. She was evaluated by CT surgery, who also recommended medical therapy as tolerated for now. She was seen at cardiology clinic in 05/2015 per chart review and was told to continue ASA and Imdur.   Medication Management: Patient states she is unsure of which medications she should be taking.  Patient states she has been taking Tylenol PRN, Calcium Carbonate, Docqlace, Lantus 3 units at bedtime, rena-vit, Protonix, Phenergan, and Effexor. She had Reglan on her medication list; patient states she has been out of this medication and was unable to get refills. Additionally she has not been taking Imdur as prescribed by cardiology.   Of note, patient also mentioned that her blood pressures have been elevated at dialysis. However, BP at goal here in clinic. Patient states she has an appointment with her cardiologist on 9/26.    Colonoscopy: no; per patient last colonoscopy was  when she was in her 86s  Pneumonia Vaccine: No; patient states she usually gets her vaccinations at dialysis. However patient states dialysis does not administer Zostavax.  Mammogram: no Menopause: yes  Vaginal Bleeding: no  Tobacco Use: no   Alcohol Use: no  Other Drugs: no   Support Structure: yes . Patient's husband recently passed away, and she recently moved to a retirement home ([facility]) in August. Patient's sister and sister in law have been very supportive during this period of time.   ROS General ROS: negative, notes of weight loss of about 10lb since going to retirement center in August where her meals are healthier and she has been more active. Ophthalmic ROS: no blurred vision ENT ROS:no rhinorrhea, cough Hematological and Lymphatic ROS: notes of chronic easy bruising Respiratory ROS: no SOB Cardiovascular ROS: no chest pain, palpitations, sob, LE swelling Gastrointestinal ROS: no abdominal pain, notes of chronic loose stools (watery/formed stools), no blood on stool but notes of hemorrhoids and sometimes has blood on toilet paper (also uses perparationH). Genito-Urinary ROS: oliguric due to ESRD Musculoskeletal ROS: notes of multiple falls which is chronic for about 2 years; over the past month notes of 3 falls not associated with syncope; notes it feels as if her legs give out. Is working with physical therapy twice a week at [facility]: no new rashes or skin changes  Past Medical History Patient Active Problem List   Diagnosis Date Noted  . Hyperlipidemia   . Diabetes mellitus type 2 in obese 05/07/2015  . CAD in native artery 04/25/2015  . Loss of consciousness 04/23/2015  .  Syncope 04/23/2015  . Fall   . Facial hematoma   . Moderate nonproliferative diabetic retinopathy(362.05) 07/16/2014  . Other malaise and fatigue 06/12/2014  . Memory loss 06/12/2014  . Right knee pain 02/22/2014  . Trouble in sleeping 02/22/2014  . End stage  renal disease 03/16/2013  . Aftercare following surgery of the circulatory system, [place] 03/16/2013  . Nausea and vomiting 02/14/2013  . Anemia 02/14/2013  . Depression 08/28/2011  . Diabetes mellitus type 2 in nonobese 04/18/2011  . Constipation 04/18/2011  . Low blood pressure 04/18/2011    Medications- reviewed and updated Current Outpatient Prescriptions  Medication Sig Dispense Refill  . acetaminophen (TYLENOL) 500 MG tablet Take 500 mg by mouth every 6 (six) hours as needed for headache.    . calcium carbonate (OS-CAL - DOSED IN MG OF ELEMENTAL CALCIUM) 1250 MG tablet TAKE 1 TABLET BY MOUTH THREE TIMES DAILY WITH MEALS (Patient taking differently: TAKE 1 TABLET BY MOUTH THREE TIMES DAILY WITH MEALS TAKE TWO TABLETS WITH SNACKS) 100 tablet 0  . docusate sodium (DOCQLACE) 100 MG capsule Take 1 capsule (100 mg total) by mouth daily as needed. (Patient taking differently: Take 100 mg by mouth daily as needed for mild constipation (CONSTIPATION). ) 30 capsule 3  . insulin glargine (LANTUS) 100 UNIT/ML injection Inject 0.03 mLs (3 Units total) into the skin at bedtime. 5 mL 6  . multivitamin (RENA-VIT) TABS tablet Take 1 tablet by mouth at bedtime. 90 tablet 3  . pantoprazole (PROTONIX) 40 MG tablet Take 1 tablet (40 mg total) by mouth daily. 30 tablet 1  . isosorbide mononitrate (ISMO,MONOKET) 10 MG tablet Take 1.5 tablets (15 mg total) by mouth daily. 30 tablet 0  . promethazine (PHENERGAN) 25 MG tablet Take 1 tablet (25 mg total) by mouth every 6 (six) hours as needed for nausea. (Patient not taking: Reported on 08/06/2015) 30 tablet 1  . venlafaxine XR (EFFEXOR-XR) 75 MG 24 hr capsule Take 75 mg by mouth daily.  1   No current facility-administered medications for this visit.    Objective: BP 142/88 mmHg  Pulse 60  Temp(Src) 98 F (36.7 C) (Oral)  Ht 5' 4.5" (1.638 m)  Wt 186 lb 4 oz (84.482 kg)  BMI 31.49 kg/m2 Gen: NAD, alert, cooperative with exam HEENT: NCAT, EOMI, PERRL CV:  RRR, good S1/S2, no murmur Resp: CTABL, no wheezes, non-labored Abd: Soft, Non Tender, Non Distended, BS present, no guarding or organomegaly Ext: Trace edema in LE, warm; venous stasis noted in LE; Fistula on left forearm. Neuro: Alert and oriented, No gross deficits  Assessment/Plan:  Health Maintenance: - provided patient with contact information to make an appointment for mammogram - Per patient, she would rather wait until dialysis to received Influenza and PNA vaccinations - Provided Zostavax prescription - will discuss colonoscopy and pap for screening at follow up visit in 2-3 weeks - will also consider discussing Dexa scan to screen for osteoporosis  DM2: Patient lost her glucometer and strips during her move to retirement home. A1c today 5.4. We did have enough time during this visit to discuss her diabetes in depth - provided form/prescription for glucometer, strips, and lacet  - follow up in 2-3 weeks to further discuss and to complete a diabetic foot exam   Severe CAD: Per chart review, cardiology recommended patient to continue Imdur. However patient has not been taking this medication - restarted Imdur 15mg  daily. - advised patient to keep her cardiology appointment on 9/26  Gastroparesis: Patient's medication  list included Phenergan and Reglan for nausea. Patient has not been taking Reglan and has not had it refilled. States her nausea is not as significant as it was in the past.  - Discontinued Reglan as Phenergan has similar mechanism of action - will discuss further in detail at a future visit   Orders Placed This Encounter  Procedures  . POCT glycosylated hemoglobin (Hb A1C)    Meds ordered this encounter  Medications  . venlafaxine XR (EFFEXOR-XR) 75 MG 24 hr capsule    Sig: Take 75 mg by mouth daily.    Refill:  1  . insulin glargine (LANTUS) 100 UNIT/ML injection    Sig: Inject 0.03 mLs (3 Units total) into the skin at bedtime.    Dispense:  5 mL     Refill:  6  . pantoprazole (PROTONIX) 40 MG tablet    Sig: Take 1 tablet (40 mg total) by mouth daily.    Dispense:  30 tablet    Refill:  1    Schedule appt with PCP for further refills  . isosorbide mononitrate (ISMO,MONOKET) 10 MG tablet    Sig: Take 1.5 tablets (15 mg total) by mouth daily.    Dispense:  30 tablet    Refill:  0

## 2015-08-07 ENCOUNTER — Telehealth: Payer: Self-pay | Admitting: Internal Medicine

## 2015-08-07 NOTE — Telephone Encounter (Signed)
Started having diarrhea last night, continued this morning, went to dialysis today at 10 am, sister unsure how she is doing right now because she is still there but wanted to know what patient could do for the diarrhea.

## 2015-08-07 NOTE — Telephone Encounter (Signed)
Return call to patient's sister.  Manus Gunning stated that patient was already at the dialysis clinic. Susa Day to contact the dialysis clinic to see if they could prescribed patient something to help with diarrhea.  Make sure patient keep well hydrated.  Will forward PCP message for further advise.  Derl Barrow, RN

## 2015-08-08 NOTE — Telephone Encounter (Signed)
I agree with given advice

## 2015-08-09 ENCOUNTER — Telehealth: Payer: Self-pay | Admitting: Internal Medicine

## 2015-08-09 NOTE — Telephone Encounter (Signed)
Pt called because the handwritten prescription for the patient glucometer can not be filled because the doctor didn't put instruction on how many times a day to test. Can we call the pharmacy and call this in instead of them coming back for another prescription. jw

## 2015-08-09 NOTE — Telephone Encounter (Signed)
Patient will need to check her blood sugar before breakfast everyday.

## 2015-08-09 NOTE — Telephone Encounter (Signed)
Will forward to Md to see how often patient should testing before calling this into the pharmacy. Jazmin Hartsell,CMA

## 2015-08-09 NOTE — Telephone Encounter (Signed)
Dr. Gwendlyn Deutscher signed a new script and faxed to [facility] for patient.  Per her medicare plan she has to have a hard copy of this. Jazmin Hartsell,CMA

## 2015-08-12 ENCOUNTER — Encounter: Payer: Self-pay | Admitting: Cardiology

## 2015-08-12 ENCOUNTER — Ambulatory Visit (INDEPENDENT_AMBULATORY_CARE_PROVIDER_SITE_OTHER): Payer: Medicare Other | Admitting: Cardiology

## 2015-08-12 VITALS — BP 144/100 | HR 61 | Ht 64.5 in | Wt 195.1 lb

## 2015-08-12 DIAGNOSIS — I2510 Atherosclerotic heart disease of native coronary artery without angina pectoris: Secondary | ICD-10-CM | POA: Diagnosis not present

## 2015-08-12 MED ORDER — ATORVASTATIN CALCIUM 40 MG PO TABS: 40.0000 mg | ORAL_TABLET | Freq: Every day | ORAL | Status: DC

## 2015-08-12 NOTE — Patient Instructions (Signed)
Medication Instructions:  Your physician has recommended you make the following change in your medication: Start atorvastatin 40 mg by mouth daily at bedtime. Start aspirin 81 mg by mouth daily if OK with your kidney doctor    Labwork: Your physician recommends that you return for fasting lab work in: 6 weeks.  Scheduled for November 7,2016.  The lab opens at 7:30 AM   Testing/Procedures: none  Follow-Up: Your physician recommends that you schedule a follow-up appointment in: about 4 weeks with Dr. Tamala Julian. We will call you to schedule this appointment.     Any Other Special Instructions Will Be Listed Below (If Applicable).

## 2015-08-12 NOTE — Progress Notes (Signed)
08/12/2015 [name] [name]   04/14/1951  [id number]  Primary Physician Smiley Houseman, MD Primary Cardiologist: Dr. Tamala Julian   Reason for Visit/CC: F/u for CAD/ Ischemic Cardiomyopathy  HPI:  The patient is a 64 year old female, followed by Dr. Tamala Julian. She has a history of end-stage renal disease, on hemodialysis MWF, status post right nephrectomy. Also with CAD, DM and HLD.   She was recently admitted to [facility] in June 2016 for syncope that resulted in significant facial trauma. CT of the head was negative for any acute abnormalities/hemorrhage. Workup for syncope included an echocardiogram which revealed severe left ventricular systolic dysfunction with an ejection fraction of 35%, also with severe hypokinesis. Subsequent left heart catheterization by Dr. Tamala Julian revealed severe multivessel disease. She was evaluated by CT surgery and given her multiple other co morbidities and significant face/head trauma, it was decided that she would be best treated with medical therapy in the short term.  Dr. Prescott Gum outlined in his consult note from 04/25/15 the following:   "The patient would be a poor candidate for CABG because of her renal failure, fragile condition, poor mobility, venous stasis of the lower extremities with probable poor conduit, and currently very high risk of high-dose anticoagulation worsening her soft tissue injuries to the head and neck and face. I would recommend medical therapy currently, allowing her to recover from the trauma to the head and neck area and then reevaluation for PCI which would allow time for recovery before she would be placed on Plavix. I will also be willing to reevaluate her for CABG as an outpatient in the future when she is recovered from her acute injuries".  Unfortunately, her medical options were limited. She did not tolerate beta blocker therapy due to issues with hypotension/bradycardia during HD. An ACE inhibitor was not started due to her  chronic kidney disease. She was placed on Imdur and tolerated this well.  A lipid panel was obtained revealing elevated LDL of 119 mg/dL with normal hepatic function, however it appeared she was never placed on statin therapy.   She was also evaluated by electrophysiology during her hospitalization. The decision was made that she would not need a loop recorder, however they did recommend treatment/observation with a LifeVest. She has not been compliant with this. She turned in the device shortly after discharge due to skin irritation and constantly alarming with dialysis.   She was seen in hospital follow-up with Truitt Merle, nurse practitioner, on 05/28/2015 and she was stable at that point. She was instructed to follow-up with Dr. Tamala Julian on 07/18/2015, however she no showed. She rescheduled her appt for today.  In follow-up, she reports that she has done well. She denies any chest pain or dyspnea. No further syncope/ near syncope. Her BP is slightly elevated today, but she note frequent issues with hypotension during HD. She reports full medication compliance. Her facial trauma has nearly resolved.    Current Outpatient Prescriptions  Medication Sig Dispense Refill  . acetaminophen (TYLENOL) 500 MG tablet Take 500 mg by mouth every 6 (six) hours as needed for headache.    . calcium carbonate (OS-CAL - DOSED IN MG OF ELEMENTAL CALCIUM) 1250 (500 CA) MG tablet Take 1 tablet by mouth 3 (three) times daily with meals.    . docusate sodium (COLACE) 100 MG capsule Take 100 mg by mouth daily as needed for mild constipation.    . insulin glargine (LANTUS) 100 UNIT/ML injection Inject 0.03 mLs (3 Units total) into  the skin at bedtime. 5 mL 6  . isosorbide mononitrate (ISMO,MONOKET) 10 MG tablet Take 1.5 tablets (15 mg total) by mouth daily. 30 tablet 0  . lanthanum (FOSRENOL) 1000 MG chewable tablet Chew 1,000 mg by mouth 3 (three) times daily with meals.    . multivitamin (RENA-VIT) TABS tablet Take 1  tablet by mouth at bedtime. 90 tablet 3  . pantoprazole (PROTONIX) 40 MG tablet Take 1 tablet (40 mg total) by mouth daily. 30 tablet 1  . venlafaxine XR (EFFEXOR-XR) 75 MG 24 hr capsule Take 75 mg by mouth daily.  1   No current facility-administered medications for this visit.    Allergies  Allergen Reactions  . Aleve [Naproxen Sodium]     Due to kidneys.  . Motrin [Ibuprofen]     Due to kidneys.    Social History   Social History  . Marital Status: Married    Spouse Name: N/A  . Number of Children: N/A  . Years of Education: N/A   Occupational History  . Not on file.   Social History Main Topics  . Smoking status: Never Smoker   . Smokeless tobacco: Never Used  . Alcohol Use: No  . Drug Use: No  . Sexual Activity: Not on file   Other Topics Concern  . Not on file   Social History Narrative   Lives with husband [name] [name] (born [birth year]).    Denies alcohol, tobacco (never smoked), other drugs   Hobbies: reading           Review of Systems: General: negative for chills, fever, night sweats or weight changes.  Cardiovascular: negative for chest pain, dyspnea on exertion, edema, orthopnea, palpitations, paroxysmal nocturnal dyspnea or shortness of breath Dermatological: negative for rash Respiratory: negative for cough or wheezing Urologic: negative for hematuria Abdominal: negative for nausea, vomiting, diarrhea, bright red blood per rectum, melena, or hematemesis Neurologic: negative for visual changes, syncope, or dizziness All other systems reviewed and are otherwise negative except as noted above.    Blood pressure 144/100, pulse 61, height 5' 4.5" (1.638 m), weight 195 lb 1.9 oz (88.506 kg), SpO2 97 %.  General appearance: alert, cooperative and no distress Neck: no carotid bruit and no JVD Lungs: clear to auscultation bilaterally Heart: regular rate and rhythm, S1, S2 normal, no murmur, click, rub or gallop Extremities: no LEE Pulses: 2+ and  symmetric Skin: warm and dry Neurologic: Grossly normal  EKG not performed.   ASSESSMENT AND PLAN:   1. Severe Multi-Vessel CAD: she denies any chest pain with medical therapy. Her facial/head trauma has nearly resolved. Per Dr. Prescott Gum, she should be reconsidered for PCI, vs re-evaluation for CABG vs continuation of medical therapy. I will contact her primary cardiologist, Dr. Tamala Julian, to see if he wants to consider attempt at PCI vs referral back to Dr. Prescott Gum. Luckily, she has remained asymptomatic on medical therapy. We will continue this plan for now. Will add statin therapy, given her elevated level of 119 mg/dL. We also discussed adding low dose ASA, however patient mentioned that her nephrologist told her to refrain from ASA use. Unsure why. She was told to discuss with him/her when she presents to HD tomorrow.   2. HLD: LDL is 119. Hepatic function is normal. Will start Lipitor 40 mg. Will recheck FLP and HFTs in 6 weeks.   3. Ischemic Cardiomyopathy: Volume is stable and controlled through HD.   PLAN    Will discuss plan of care with  Dr. Tamala Julian. For now continue medical therapy as outlined above.   Turkessa Ostrom PA-C 08/12/2015 3:24 PM

## 2015-08-15 ENCOUNTER — Telehealth: Payer: Self-pay | Admitting: Internal Medicine

## 2015-08-15 ENCOUNTER — Telehealth: Payer: Self-pay

## 2015-08-15 NOTE — Telephone Encounter (Signed)
[name] [name] from [facility] called and would like verbal orders for OT twice a week for 2 weeks. Please call her at [phone]. jw

## 2015-08-15 NOTE — Telephone Encounter (Signed)
Called pt to schedule a wok in appt with Dr.Smith for 4 weeks per [place] S,PA. appt scheduled for pt non-dialysis day for 11/9 @ 3:15pm. Pt voiced appreciation for the call and verbalized understanding.

## 2015-08-16 NOTE — Telephone Encounter (Signed)
Spoke with Mallory and gave verbal orders for OT

## 2015-08-27 ENCOUNTER — Ambulatory Visit: Payer: Medicare Other | Admitting: Internal Medicine

## 2015-08-28 ENCOUNTER — Encounter: Payer: Self-pay | Admitting: Internal Medicine

## 2015-08-28 ENCOUNTER — Ambulatory Visit (INDEPENDENT_AMBULATORY_CARE_PROVIDER_SITE_OTHER): Payer: Medicare Other | Admitting: Internal Medicine

## 2015-08-28 VITALS — BP 130/80 | HR 65 | Temp 97.5°F | Ht 64.5 in | Wt 187.0 lb

## 2015-08-28 DIAGNOSIS — Z13820 Encounter for screening for osteoporosis: Secondary | ICD-10-CM | POA: Diagnosis not present

## 2015-08-28 DIAGNOSIS — E119 Type 2 diabetes mellitus without complications: Secondary | ICD-10-CM | POA: Diagnosis not present

## 2015-08-28 DIAGNOSIS — F329 Major depressive disorder, single episode, unspecified: Secondary | ICD-10-CM

## 2015-08-28 DIAGNOSIS — Z1211 Encounter for screening for malignant neoplasm of colon: Secondary | ICD-10-CM | POA: Diagnosis not present

## 2015-08-28 DIAGNOSIS — F32A Depression, unspecified: Secondary | ICD-10-CM

## 2015-08-28 LAB — GLUCOSE, CAPILLARY: GLUCOSE-CAPILLARY: 124 mg/dL — AB (ref 65–99)

## 2015-08-28 NOTE — Patient Instructions (Signed)
Thank you for coming in - Continue to check your blood sugar once a day. You can check in the morning before eating your breakfast - Make a follow up appointment: Follow up for your blood sugar in 2-3 months so we can recheck A1c  - Please continue to watch what you eat  - Referral has been made for colonoscopy  - I also ordered dexa scan for you. You can go to [facility] to get this done - we need to schedule you for a pap smear. We will do this in 2-3 months when you come back .

## 2015-08-28 NOTE — Assessment & Plan Note (Signed)
A1c 5.4 in September 2016. Patient does not meet criteria for diagnosis of DM.  - discontinued Lantus 3 units at bedtime as A1c is well below goal of 7 and to reduce risk of hypoglycemia/falls - instructed patient to monitor CBG daily before breakfast and keep a log - continue to monitor carb intake and exercise - follow up in 2-3 months to repeat A1c

## 2015-08-28 NOTE — Assessment & Plan Note (Signed)
Unsure if patient is taking Effexor which is on her medication list; this medication was refilled in September so she should have this at home. Patient is unsure of medication names and did not bring her medications to clinic. PHQ9 slightly better from September. Patient declines referral to Dr. Gwenlyn Saran for therapy. I think she would benefit from therapy due to current stressors in her life including her sister who is battling cancer and due to her recent loss of her husband. However, she continues to decline.  Denies SI or HI. Notes this is chronic and waxes and wanes.  - Effexor XR 75mg   - encouraged patient to contemplate therapy and to follow up if depression gets worse

## 2015-08-28 NOTE — Progress Notes (Signed)
Patient ID: KEAJAH KILLOUGH, female   DOB: 1951/03/27, 64 y.o.   MRN: [id number] Subjective:   CC: DM follow up   HPI:  Patient is here with her sister Manus Gunning.  DM:  Patient states she takes Lantus 3 units at bedtime, and does not miss dose. Patient was given glucometer, strips, and lancet during previous visit and was instructed to check CBG daily, however patient has not been able to do this. Patient denies any hypoglycemic symptoms; The last hypoglycemic episode she had was approximately 1 year ago at a doctor's office where she was given glucose paste. Notes of intermittent tingling in her feet that has been stable; this does not bother her. She has an opthalmology appointment scheduled for this month.   Depression: Patient's husband recently passed away, and her sister is currently battling cancer. She has good support from her sister, Manus Gunning. States that she has had depression for a long time, and it waxes and wanes. Denies SI or HI. PHQ 9 completed today with score of 11 (from 18 in September). Unsure if she is taking Effexor listed on her medication list. Patient does not know names of medications and did not bring her medications with her today. Currently does not want to change medication dosage. Currently declines referral to Dr. Gwenlyn Saran.   Severe CAD:  Seen by cardiology last week. Was told to continue on medical therapy for now. Has another appointment with her cardiologist Dr. Tamala Julian to discuss PCI vs CABG vs continuation of medical therapy.   Health Maintenance: - Patient states she has not received influenza or PNA vaccines from dialysis center yet. Would like to wait until they are able to administer  Review of Systems - Per HPI.  PMH, FH, or SH Smoking status: never smoker    Objective:  Physical Exam BP 130/80 mmHg  Pulse 65  Temp(Src) 97.5 F (36.4 C) (Oral)  Ht 5' 4.5" (1.638 m)  Wt 187 lb (84.823 kg)  BMI 31.61 kg/m2 Gen: NAD, alert, cooperative with exam HEENT: NCAT,  EOMI, PERRL CV: RRR, good S1/S2, no murmur Resp: CTABL, no wheezes, non-labored Abd: Soft, Non Tender, Non Distended, BS present, no guarding or organomegaly Ext: venous stasis noted in LE; diabetic foot exam performed today  Neuro: Alert and oriented, No gross deficits  Assessment:     ROWYN MUSTAPHA is a 64 y.o. female here for DM management     Plan:     # See problem list and after visit summary for problem-specific plans.   # Health Maintenance:  - made referral to GI for screening colonoscopy - ordered DG bone density for osteoporosis screening - will discuss need for PAP smear at 3 month follow up - Patient states she has not received influenza or PNA vaccines from dialysis center yet. Would like to wait until they are able to administer - contact information for mammography given in September   Follow-up: Follow up in 3 to evaluation after discontinuing Lantus and also for PAP smear   Smiley Houseman, MD [place]

## 2015-08-30 ENCOUNTER — Telehealth: Payer: Self-pay | Admitting: Interventional Cardiology

## 2015-08-30 ENCOUNTER — Telehealth: Payer: Self-pay | Admitting: Internal Medicine

## 2015-08-30 NOTE — Telephone Encounter (Signed)
Will forward to MD to get verbal ok for these orders. Jazmin Hartsell,CMA

## 2015-08-30 NOTE — Telephone Encounter (Signed)
Spoke with Mallory to provide verbal orders for OT for [name] [name]

## 2015-08-30 NOTE — Telephone Encounter (Signed)
Mallory called and would like to get verbal orders to extend her OT for the patient. Twice a week for 2 weeks and then once a week for 1 week. Please call her with verbal orders. jw

## 2015-08-30 NOTE — Telephone Encounter (Signed)
Linda with [facility] is wanting to get an order to continue Cardio/Pulmonary program to help patient see warning signs of CHF and to help lessen hospital visits. Will forward to Dr. Tamala Julian.

## 2015-08-30 NOTE — Telephone Encounter (Signed)
New message     Home health calling     Need a verbal order : Continue home health  for pulmonary - one visit a week for 9 weeks.  Would like a call back today

## 2015-09-01 NOTE — Telephone Encounter (Signed)
Okay to continue [employer] rehab

## 2015-09-02 ENCOUNTER — Telehealth: Payer: Self-pay | Admitting: Internal Medicine

## 2015-09-02 NOTE — Telephone Encounter (Signed)
Will forward to MD to give ok for orders. Jazmin Hartsell,CMA

## 2015-09-02 NOTE — Telephone Encounter (Signed)
Called Linda with [facility] and informed her of Dr. Thompson Caul note below. She verbalized understanding and thanked me for the call back.

## 2015-09-02 NOTE — Telephone Encounter (Signed)
[facility] home health nurse called and they did re certify the patient and they would like to have twice a week for 5 weeks. Please call her at [phone]. jw

## 2015-09-03 NOTE — Telephone Encounter (Signed)
Spoke with Verdis Frederickson from [facility] and agreed to verbal orders for [name] [name] for PT twice a week for 5 weeks.

## 2015-09-05 ENCOUNTER — Other Ambulatory Visit: Payer: Self-pay | Admitting: Adult Health

## 2015-09-06 ENCOUNTER — Telehealth: Payer: Self-pay | Admitting: Internal Medicine

## 2015-09-06 ENCOUNTER — Encounter: Payer: Self-pay | Admitting: Obstetrics and Gynecology

## 2015-09-06 ENCOUNTER — Ambulatory Visit (INDEPENDENT_AMBULATORY_CARE_PROVIDER_SITE_OTHER): Payer: Medicare Other | Admitting: Obstetrics and Gynecology

## 2015-09-06 VITALS — BP 123/70 | HR 65 | Temp 98.1°F | Ht 64.0 in | Wt 189.1 lb

## 2015-09-06 DIAGNOSIS — R030 Elevated blood-pressure reading, without diagnosis of hypertension: Secondary | ICD-10-CM | POA: Diagnosis not present

## 2015-09-06 DIAGNOSIS — IMO0001 Reserved for inherently not codable concepts without codable children: Secondary | ICD-10-CM

## 2015-09-06 DIAGNOSIS — R1010 Upper abdominal pain, unspecified: Secondary | ICD-10-CM | POA: Diagnosis not present

## 2015-09-06 NOTE — Telephone Encounter (Signed)
Return call to [facility] regarding patient's blood pressure, headache and stomach pain.  Patient's blood pressure now is 140/70.  Advised patient and sister that she should be seen for the headache, stomach pain and to make sure her blood pressure is ok.  Appointment today at 2:15 PM.  Derl Barrow, RN

## 2015-09-06 NOTE — Telephone Encounter (Signed)
Malorie, the pt's therapist through [facility], is calling because she states that the patient's BP is 136/106 currently and she is complaining of headache and stomach pain. I offered to bring the pt in today for an appt or for her to walk-in, however, Malorie suggested for a nurse to contact pt. Thank you, Fonda Kinder, ASA

## 2015-09-06 NOTE — Patient Instructions (Addendum)
   No concerning signs on abdominal exam. Please monitor abdominal pain. See below on when to get help  May need imaging of abdomen if continues  BP normal continue to monitor no adjustments  Start Miralax   Abdominal Pain, Adult Many things can cause belly (abdominal) pain. Most times, the belly pain is not dangerous. Many cases of belly pain can be watched and treated at home. HOME CARE   Do not take medicines that help you go poop (laxatives) unless told to by your doctor.  Only take medicine as told by your doctor.  Eat or drink as told by your doctor. Your doctor will tell you if you should be on a special diet. GET HELP IF:  You do not know what is causing your belly pain.  You have belly pain while you are sick to your stomach (nauseous) or have runny poop (diarrhea).  You have pain while you pee or poop.  Your belly pain wakes you up at night.  You have belly pain that gets worse or better when you eat.  You have belly pain that gets worse when you eat fatty foods.  You have a fever. GET HELP RIGHT AWAY IF:   The pain does not go away within 2 hours.  You keep throwing up (vomiting).  The pain changes and is only in the right or left part of the belly.  You have bloody or tarry looking poop. MAKE SURE YOU:   Understand these instructions.  Will watch your condition.  Will get help right away if you are not doing well or get worse.   This information is not intended to replace advice given to you by your health care provider. Make sure you discuss any questions you have with your health care provider.   Document Released: 04/20/2008 Document Revised: 11/23/2014 Document Reviewed: 07/12/2013 Elsevier Interactive Patient Education [employer].

## 2015-09-06 NOTE — Progress Notes (Signed)
   Subjective:   Patient ID: [name] [name], female    DOB: 10-Aug-1951, 64 y.o.   MRN: [id number]  Patient presents for Same Day Appointment  Chief Complaint  Patient presents with  . Headache    neck feels swollen  . Abdominal Pain    x 1 day not sure if related to going to dialysis yesterday    HPI: # Elevated BP: -HH nurse called clinic stating that patient's BP was elevated to 136/106 and had associated HA and abdominal pain -patient taking BP medication as appropriate -has ESRD and gets dialysis T,T,S -Stomach pain is intermittent; describes it as a queasy feeling and mainly in her upper abdomen (states feels better now) -has constipation with episodes of explosive diarrhea -Not eating a whole lot; has to eat small portions - has nausea and on antiemetic medication  -has new life changes including husband passing 3 months ago; recent move to assisted living facility, endorses a lot of stress  -states she doesn't know why she had to come in because feels better now -BP rechecked at home was 140/70   Review of Systems   See HPI for ROS.   Past medical history, surgical, family, and social history reviewed and updated in the EMR as appropriate.  Objective:  BP 123/70 mmHg  Pulse 65  Temp(Src) 98.1 F (36.7 C) (Oral)  Ht 5\' 4"  (1.626 m)  Wt 189 lb 1 oz (85.758 kg)  BMI 32.44 kg/m2 Vitals and nursing note reviewed  Physical Exam  Constitutional: She is oriented to person, place, and time and well-developed, well-nourished, and in no distress.  Cardiovascular: Normal rate, regular rhythm and intact distal pulses.   Pulmonary/Chest: Effort normal and breath sounds normal.  Abdominal: Soft. Normal appearance and bowel sounds are normal. There is tenderness in the right upper quadrant and right lower quadrant. There is no rigidity and no guarding.  Neurological: She is alert and oriented to person, place, and time.  Grossly non-focal  Skin: Skin is warm and dry.     Assessment & Plan:  1. Elevated BP: Patient's blood pressures seem to be very liable. She ranges from systolic 063K-160F and diastolic 093A-35T over the last 2 years. Patient does have ESRD on dialysis. She has had several episodes of syncope and falls some associated with low blood pressures. States she just started BP medication but not listed on medication list and cannot recall name. BP within normal range in clinic. No red flags on exam. Headache has resolved and neuro exam unremarkable. -continue to moniter pressures at home -symtpoms of hypertension and hypotension discussed -needs to follow-up with PCP in the next 4 weeks -bring medications at next visit  2. Pain of upper abdomen: Abdominal pain localized to the upper quadrant and is intermittent. Specific etiology unknown but differential includes constipation (h/o constipation; not on stool softener), gastroparesis (history consistent with this diagnosis and prior studies with delayed gastric emptying).  Abdominal exam benign with only tenderness to deep palpation in upper quadrants. Vitals are stable. No guarding or rigidity.  -start Miralax -continue use of antiemetic; Reglan seen in past medication list  -consider abdominal imaging if abdominal pain continues after these conservative treatments  Luiz Blare, DO 09/06/2015, 2:41 PM PGY-2, [place]

## 2015-09-11 ENCOUNTER — Telehealth: Payer: Self-pay | Admitting: Internal Medicine

## 2015-09-11 NOTE — Telephone Encounter (Signed)
Will forward to MD covering for PCP and PCP for review of verbal order. Caroleena Paolini, CMA.

## 2015-09-11 NOTE — Telephone Encounter (Signed)
[name] [name] may receive an extended visit next week for occupational therapy. Thank you!

## 2015-09-11 NOTE — Telephone Encounter (Signed)
Requesting verbal order for an extended visit next week for occupational therapy

## 2015-09-12 NOTE — Telephone Encounter (Signed)
Lm on identified VM for [facility] therapist. Johnney Ou

## 2015-09-16 ENCOUNTER — Encounter: Payer: Self-pay | Admitting: Internal Medicine

## 2015-09-16 ENCOUNTER — Emergency Department (HOSPITAL_COMMUNITY): Payer: Medicare Other

## 2015-09-16 ENCOUNTER — Ambulatory Visit (INDEPENDENT_AMBULATORY_CARE_PROVIDER_SITE_OTHER): Payer: Medicare Other | Admitting: Internal Medicine

## 2015-09-16 ENCOUNTER — Encounter (HOSPITAL_COMMUNITY): Payer: Self-pay | Admitting: Emergency Medicine

## 2015-09-16 ENCOUNTER — Emergency Department (HOSPITAL_COMMUNITY)
Admission: EM | Admit: 2015-09-16 | Discharge: 2015-09-16 | Disposition: A | Discharge status: Home / Self Care | Payer: Medicare Other | Attending: Emergency Medicine | Admitting: Emergency Medicine

## 2015-09-16 VITALS — BP 122/80 | HR 71 | Temp 98.4°F | Ht 64.0 in | Wt 185.0 lb

## 2015-09-16 DIAGNOSIS — R112 Nausea with vomiting, unspecified: Secondary | ICD-10-CM

## 2015-09-16 DIAGNOSIS — E119 Type 2 diabetes mellitus without complications: Secondary | ICD-10-CM | POA: Diagnosis not present

## 2015-09-16 DIAGNOSIS — N186 End stage renal disease: Secondary | ICD-10-CM | POA: Diagnosis not present

## 2015-09-16 DIAGNOSIS — Z9889 Other specified postprocedural states: Secondary | ICD-10-CM | POA: Diagnosis not present

## 2015-09-16 DIAGNOSIS — E785 Hyperlipidemia, unspecified: Secondary | ICD-10-CM | POA: Insufficient documentation

## 2015-09-16 DIAGNOSIS — L03115 Cellulitis of right lower limb: Secondary | ICD-10-CM

## 2015-09-16 DIAGNOSIS — Z79899 Other long term (current) drug therapy: Secondary | ICD-10-CM | POA: Diagnosis not present

## 2015-09-16 DIAGNOSIS — R197 Diarrhea, unspecified: Secondary | ICD-10-CM | POA: Insufficient documentation

## 2015-09-16 DIAGNOSIS — I120 Hypertensive chronic kidney disease with stage 5 chronic kidney disease or end stage renal disease: Secondary | ICD-10-CM | POA: Diagnosis not present

## 2015-09-16 DIAGNOSIS — R1110 Vomiting, unspecified: Secondary | ICD-10-CM | POA: Diagnosis present

## 2015-09-16 DIAGNOSIS — I2510 Atherosclerotic heart disease of native coronary artery without angina pectoris: Secondary | ICD-10-CM | POA: Diagnosis not present

## 2015-09-16 DIAGNOSIS — E860 Dehydration: Secondary | ICD-10-CM | POA: Insufficient documentation

## 2015-09-16 DIAGNOSIS — Z992 Dependence on renal dialysis: Secondary | ICD-10-CM | POA: Insufficient documentation

## 2015-09-16 DIAGNOSIS — Z8669 Personal history of other diseases of the nervous system and sense organs: Secondary | ICD-10-CM | POA: Diagnosis not present

## 2015-09-16 DIAGNOSIS — Z862 Personal history of diseases of the blood and blood-forming organs and certain disorders involving the immune mechanism: Secondary | ICD-10-CM | POA: Diagnosis not present

## 2015-09-16 LAB — COMPREHENSIVE METABOLIC PANEL
ALBUMIN: 2.9 g/dL — AB (ref 3.5–5.0)
ALK PHOS: 87 U/L (ref 38–126)
ALT: 16 U/L (ref 14–54)
ANION GAP: 15 (ref 5–15)
AST: 28 U/L (ref 15–41)
BILIRUBIN TOTAL: 0.8 mg/dL (ref 0.3–1.2)
BUN: 27 mg/dL — ABNORMAL HIGH (ref 6–20)
CO2: 27 mmol/L (ref 22–32)
CREATININE: 5.71 mg/dL — AB (ref 0.44–1.00)
Calcium: 8.8 mg/dL — ABNORMAL LOW (ref 8.9–10.3)
Chloride: 93 mmol/L — ABNORMAL LOW (ref 101–111)
GFR calc Af Amer: 8 mL/min — ABNORMAL LOW (ref >60)
GFR calc non Af Amer: 7 mL/min — ABNORMAL LOW (ref >60)
GLUCOSE: 107 mg/dL — AB (ref 65–99)
Potassium: 3.5 mmol/L (ref 3.5–5.1)
Sodium: 135 mmol/L (ref 135–145)
Total Protein: 6.1 g/dL — ABNORMAL LOW (ref 6.5–8.1)

## 2015-09-16 LAB — CBC
HCT: 34.8 % — ABNORMAL LOW (ref 36.0–46.0)
Hemoglobin: 11.5 g/dL — ABNORMAL LOW (ref 12.0–15.0)
MCH: 33.4 pg (ref 26.0–34.0)
MCHC: 33 g/dL (ref 30.0–36.0)
MCV: 101.2 fL — AB (ref 78.0–100.0)
Platelets: 165 10*3/uL (ref 150–400)
RBC: 3.44 MIL/uL — AB (ref 3.87–5.11)
RDW: 14.1 % (ref 11.5–15.5)
WBC: 7.9 10*3/uL (ref 4.0–10.5)

## 2015-09-16 LAB — GLUCOSE, CAPILLARY: Glucose-Capillary: 118 mg/dL — ABNORMAL HIGH (ref 65–99)

## 2015-09-16 LAB — I-STAT CG4 LACTIC ACID, ED: Lactic Acid, Venous: 1.16 mmol/L (ref 0.5–2.0)

## 2015-09-16 LAB — LIPASE, BLOOD: Lipase: 17 U/L (ref 11–51)

## 2015-09-16 MED ORDER — SODIUM CHLORIDE 0.9 % IV BOLUS (SEPSIS)
500.0000 mL | Freq: Once | INTRAVENOUS | Status: AC
  Administered 2015-09-16: 500 mL via INTRAVENOUS

## 2015-09-16 MED ORDER — CEPHALEXIN 250 MG PO CAPS: 250.0000 mg | ORAL_CAPSULE | Freq: Two times a day (BID) | ORAL | Status: DC

## 2015-09-16 MED ORDER — ONDANSETRON HCL 4 MG/2ML IJ SOLN
4.0000 mg | Freq: Once | INTRAMUSCULAR | Status: AC
  Administered 2015-09-16: 4 mg via INTRAVENOUS
  Filled 2015-09-16: qty 2

## 2015-09-16 MED ORDER — PIPERACILLIN-TAZOBACTAM IN DEX 2-0.25 GM/50ML IV SOLN
2.2500 g | Freq: Three times a day (TID) | INTRAVENOUS | Status: DC
  Administered 2015-09-16: 2.25 g via INTRAVENOUS
  Filled 2015-09-16 (×3): qty 50

## 2015-09-16 MED ORDER — ONDANSETRON 4 MG PO TBDP: ORAL_TABLET | ORAL | Status: DC

## 2015-09-16 NOTE — ED Notes (Signed)
Failed IV attempts x2.  IV team to be consulted.

## 2015-09-16 NOTE — ED Provider Notes (Signed)
CSN: [id number]     Arrival date & time 09/16/15  1227 History   First MD Initiated Contact with Patient 09/16/15 1502     Chief Complaint  Patient presents with  . Emesis  . Diarrhea     (Consider location/radiation/quality/duration/timing/severity/associated sxs/prior Treatment) The history is provided by the patient.  [name] [name] is a 64 y.o. female hx of DM, CAD, ESRD on HD, here with vomiting, diarrhea. Vomiting about 7-8 times for the last 2 days, nonbilious and nonbloody vomit. Patient has intermittent diarrhea for months that got worse. Denies fevers. Also noticed that R leg has been more red for the last 4 days, given a dose of IV abx at dialysis 2 days ago but she doesn't remember what it was. last dialysis was 2 days ago. Went to family practice center and sent here for evaluation.     Past Medical History  Diagnosis Date  . Hearing difficulty     Mild   . Diabetes mellitus ([place])   . Coronary artery disease     a. cath 6/16 Severe multivessel cad with diffuse significant segmental LAD stenosis, 95% stenosis in a large second diagonal, 75% ostial stenosis in the smaller first diagonal, 95% mid and 65% distal circumflex, 95% ostial/proximal PDA, and 40-50% left main ostial disease. Med rx initially, to be re-evaluated by CVTS.  Marland Kitchen ESRD (end stage renal disease) on dialysis ([place])        . Hypertension   . Ischemic cardiomyopathy     a. Echo 04/2015: EF 35%. Cath same time - 45%.  . Syncope   . Hyponatremia   . Anemia   . Thrombocytopenia ([facility])   . Memory loss   . Hyperlipidemia    Past Surgical History  Procedure Laterality Date  . 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 Esophagogastroduodenoscopy Left 02/20/2013    Procedure: ESOPHAGOGASTRODUODENOSCOPY (EGD);  Surgeon: Cleotis Nipper, MD;  Location: [facility] ENDOSCOPY;  Service: Endoscopy;  Laterality: Left;  . Av fistula placement Left 03/07/2013    Procedure: ARTERIOVENOUS (AV) FISTULA  CREATION;  Surgeon: Elam Dutch, MD;  Location: [facility] OR;  Service: Vascular;  Laterality: Left;  left brachiocephalic AVF  . Cardiac catheterization N/A 04/25/2015    Procedure: Left Heart Cath and Coronary Angiography;  Surgeon: Belva Crome, MD;  Location: [place] CV LAB;  Service: Cardiovascular;  Laterality: N/A;   Family History  Problem Relation Age of Onset  . Heart disease    . Diabetes    . Stroke    . Bone cancer Father     Of jaws   . Throat cancer Father    Social History  Substance Use Topics  . Smoking status: Never Smoker   . Smokeless tobacco: Never Used  . Alcohol Use: No   OB History    No data available     Review of Systems  Gastrointestinal: Positive for vomiting and diarrhea.  All other systems reviewed and are negative.     Allergies  Aleve; Motrin; and Nsaids  Home Medications   Prior to Admission medications   Medication Sig Start Date End Date Taking? Authorizing Provider  acetaminophen (TYLENOL) 500 MG tablet Take 500 mg by mouth every 6 (six) hours as needed for headache.   Yes Historical Provider, MD  atorvastatin (LIPITOR) 40 MG tablet Take 1 tablet (40 mg total) by mouth daily. Patient taking differently: Take 40 mg by mouth every morning.  08/12/15  Yes  Brittainy Erie Noe, PA-C  calcium carbonate (OS-CAL - DOSED IN MG OF ELEMENTAL CALCIUM) 1250 (500 CA) MG tablet Take 1-2 tablets by mouth 3 (three) times daily with meals.    Yes Historical Provider, MD  docusate sodium (COLACE) 100 MG capsule Take 100 mg by mouth daily as needed for mild constipation or moderate constipation.   Yes Historical Provider, MD  fluticasone (FLONASE) 50 MCG/ACT nasal spray Place 1 spray into both nostrils daily as needed for allergies or rhinitis.   Yes Historical Provider, MD  isosorbide mononitrate (ISMO,MONOKET) 10 MG tablet Take 1.5 tablets (15 mg total) by mouth daily. 08/06/15  Yes Smiley Houseman, MD  lanthanum (FOSRENOL) 1000 MG chewable tablet  Chew 1,000 mg by mouth 3 (three) times daily with meals.   Yes Historical Provider, MD  multivitamin (RENA-VIT) TABS tablet Take 1 tablet by mouth at bedtime. 09/04/14  Yes Leone Haven, MD  pantoprazole (PROTONIX) 40 MG tablet Take 1 tablet (40 mg total) by mouth daily. 08/06/15  Yes Smiley Houseman, MD  promethazine (PHENERGAN) 25 MG tablet Take 25 mg by mouth every 6 (six) hours as needed for nausea or vomiting.   Yes Historical Provider, MD  venlafaxine XR (EFFEXOR-XR) 75 MG 24 hr capsule Take 75 mg by mouth daily. 07/06/15   Historical Provider, MD   BP 114/63 mmHg  Pulse 76  Temp(Src) 97.3 F (36.3 C) (Oral)  Resp 16  Ht 5' 4.5" (1.638 m)  Wt 184 lb 4 oz (83.575 kg)  BMI 31.15 kg/m2  SpO2 100% Physical Exam  Constitutional: She is oriented to person, place, and time.  Chronically ill, dehydrated   HENT:  Head: Normocephalic.  MM dry   Eyes: Conjunctivae are normal. Pupils are equal, round, and reactive to light.  Neck: Normal range of motion.  Cardiovascular: Normal rate, regular rhythm and normal heart sounds.   Pulmonary/Chest: Effort normal and breath sounds normal. No respiratory distress. She has no wheezes. She has no rales.  Abdominal: Soft. Bowel sounds are normal. She exhibits no distension. There is no tenderness. There is no rebound.  Musculoskeletal:  Bilateral legs swollen, cellulitis R > L, no calf tenderness   Neurological: She is alert and oriented to person, place, and time.  Skin: Skin is warm. There is erythema.  Psychiatric: She has a normal mood and affect. Her behavior is normal. Judgment and thought content normal.  Nursing note and vitals reviewed.   ED Course  Procedures (including critical care time) Labs Review Labs Reviewed  COMPREHENSIVE METABOLIC PANEL - Abnormal; Notable for the following:    Chloride 93 (*)    Glucose, Bld 107 (*)    BUN 27 (*)    Creatinine, Ser 5.71 (*)    Calcium 8.8 (*)    Total Protein 6.1 (*)    Albumin  2.9 (*)    GFR calc non Af Amer 7 (*)    GFR calc Af Amer 8 (*)    All other components within normal limits  CBC - Abnormal; Notable for the following:    RBC 3.44 (*)    Hemoglobin 11.5 (*)    HCT 34.8 (*)    MCV 101.2 (*)    All other components within normal limits  LIPASE, BLOOD  URINALYSIS, ROUTINE W REFLEX MICROSCOPIC (NOT AT [facility])  I-STAT CG4 LACTIC ACID, ED    Imaging Review Dg Abd Acute W/chest  09/16/2015  CLINICAL DATA:  Vomiting and diarrhea since Saturday EXAM: DG ABDOMEN ACUTE  W/ 1V CHEST COMPARISON:  Chest radiograph 04/22/2015 FINDINGS: Enlargement of cardiac silhouette with pulmonary vascular congestion. Atherosclerotic calcification aorta. LEFT basilar scarring. Lungs otherwise clear. No pleural effusion or pneumothorax. Small amount retained contrast or radiodense medication within sigmoid colon. Nonobstructive bowel gas pattern. No bowel dilatation, bowel wall thickening or free air. Diffuse osseous demineralization. IMPRESSION: LEFT basilar scarring. Enlargement of cardiac silhouette with pulmonary vascular congestion. No acute abdominal findings. Electronically Signed   By: Lavonia Dana M.D.   On: 09/16/2015 16:21   I have personally reviewed and evaluated these images and lab results as part of my medical decision-making.   EKG Interpretation None      MDM   Final diagnoses:  None    [name] [name] is a 64 y.o. female here with vomiting, diarrhea. Likely gastro, also has R leg cellulitis. Patient doesn't want to be admitted if possible. Will give IV zofran and bolus and zosyn and PO trial.   7:04 PM Labs at baseline. Xray showed no acute findings. Lactate nl. Given 500 cc NS and zofran and zosyn. Tolerated PO fluids. Offered admission but refused. Wants to go home. Will dc home with keflex, zofran. Has follow up outpatient.      Wandra Arthurs, MD 09/16/15 [phone]

## 2015-09-16 NOTE — Progress Notes (Signed)
[facility] Phone: [phone]  Subjective:  [name] [name] presents to clinic with nausea, vomiting, and diarrhea since Saturday. Started as periumbilical abdominal pain and then progressed to vomiting/diarrhea. Her blood pressures have been ranging from 63-74/37-53. Took Isosorbide mononitrate this weekend. Dialysis schedule is T-Th-Sat. On Saturday, the nurses at dialysis noticed a RLE venous stasis ulcer that was infected. Per Patient, they gave her an antibiotic through her port during dialysis. She is feeling some throbbing and "lightening" pain in her RLE. She also feels like she is having some redness that is spreading up her RLE. Was being seen at wound clinic, who recommended that she order some compression stockings. Had some new blisters show up at the beginning of last week. Threw up 3-4 times per day. No blood or bile in the emesis. Has been dry heaving this morning. Tried taking Phenergan, but she couldn't keep it down. Hasn't been able to keep any water down. Has been having diarrhea constantly throughout the day. Has no control over it so has been wearing depends. Diarrhea has been brown. Has noticed a little bit of blood, but thinks this is from the hemorrhoids. No fevers, but has been having chills. No dysuria, makes a small amount of urine. No chest pain. Has had shortness of breath, but this occurs sometimes when she is stressed. Has headache, has taken Tylenol but it didn't help. Some mild photophobia.    All other ROS were reviewed and are negative unless otherwise noted in the HPI. Past Medical History- significant for T2DM, ESRD on dialysis, hypertension. Reviewed problem list.  Medications- reviewed and updated Current Outpatient Prescriptions  Medication Sig Dispense Refill  . acetaminophen (TYLENOL) 500 MG tablet Take 500 mg by mouth every 6 (six) hours as needed for headache.    Marland Kitchen atorvastatin (LIPITOR) 40 MG tablet Take 1 tablet (40 mg total) by mouth  daily. 30 tablet 6  . calcium carbonate (OS-CAL - DOSED IN MG OF ELEMENTAL CALCIUM) 1250 (500 CA) MG tablet Take 1 tablet by mouth 3 (three) times daily with meals.    . isosorbide mononitrate (ISMO,MONOKET) 10 MG tablet Take 1.5 tablets (15 mg total) by mouth daily. 30 tablet 0  . lanthanum (FOSRENOL) 1000 MG chewable tablet Chew 1,000 mg by mouth 3 (three) times daily with meals.    . multivitamin (RENA-VIT) TABS tablet Take 1 tablet by mouth at bedtime. 90 tablet 3  . pantoprazole (PROTONIX) 40 MG tablet Take 1 tablet (40 mg total) by mouth daily. 30 tablet 1  . venlafaxine XR (EFFEXOR-XR) 75 MG 24 hr capsule Take 75 mg by mouth daily.  1   No current facility-administered medications for this visit.   Chief complaint-noted Family history reviewed for today's visit. No changes. Social history- patient is a never smoker.  Objective: BP 122/80 mmHg  Pulse 71  Temp(Src) 98.4 F (36.9 C) (Oral)  SpO2 94% Gen: NAD, alert, cooperative with exam HEENT: NCAT, EOMI, PERRLA, dry mucous membranes, throat normal Neck: FROM, supple, no lymphadenopathy CV: RRR, good S1/S2, no murmur Resp: CTABL, no wheezes, non-labored GI: SNTND, hyperactive bowel sounds, no guarding or organomegaly Msk: Circumferential erythema of right lower extremity extending 2/3rds of the way up to her knee. Multiple blisters and scabs noted on right lower extremity. Right lower extremity appears slightly more warm than left lower extremity.  Neuro: Alert and oriented, no gross deficits Skin: multiple convalescent hyperpigmented patches on neck and upper extremities Psych: Appropriate behavior  Assessment/Plan: Cellulitis of  RLE with nausea, vomiting, and hypotension - Will send Pt to the ED for fluids and further management. Would like to admit her, but she does not want to be admitted and would rather go to the ED to see if she feels better and can go home - CBG in clinic was 118.   Hyman Bible, MD PGY-1

## 2015-09-16 NOTE — Progress Notes (Signed)
ANTIBIOTIC CONSULT NOTE - INITIAL  Pharmacy Consult for Zosyn Indication: cellulitis  Allergies  Allergen Reactions  . Aleve [Naproxen Sodium]     Due to kidneys.  . Motrin [Ibuprofen]     Due to kidneys.    Patient Measurements: Height: 5' 4.5" (163.8 cm) Weight: 184 lb 4 oz (83.575 kg) IBW/kg (Calculated) : 55.85 Adjusted Body Weight: 67 kg  Vital Signs: Temp: 97.3 F (36.3 C) (10/31 1503) Temp Source: Oral (10/31 1503) BP: 114/63 mmHg (10/31 1503) Pulse Rate: 76 (10/31 1503) Intake/Output from previous day:   Intake/Output from this shift:    Labs:  Recent Labs  09/16/15 1355  WBC 7.9  HGB 11.5*  PLT 165  CREATININE 5.71*   Estimated Creatinine Clearance: 10.3 mL/min (by C-G formula based on Cr of 5.71). No results for input(s): VANCOTROUGH, VANCOPEAK, VANCORANDOM, GENTTROUGH, GENTPEAK, GENTRANDOM, TOBRATROUGH, TOBRAPEAK, TOBRARND, AMIKACINPEAK, AMIKACINTROU, AMIKACIN in the last 72 hours.   Microbiology: No results found for this or any previous visit (from the past 720 hour(s)).  Medical History: Past Medical History  Diagnosis Date  . Hearing difficulty     Mild   . Diabetes mellitus ([place])   . Coronary artery disease     a. cath 6/16 Severe multivessel cad with diffuse significant segmental LAD stenosis, 95% stenosis in a large second diagonal, 75% ostial stenosis in the smaller first diagonal, 95% mid and 65% distal circumflex, 95% ostial/proximal PDA, and 40-50% left main ostial disease. Med rx initially, to be re-evaluated by CVTS.  Marland Kitchen ESRD (end stage renal disease) on dialysis ([place])        . Hypertension   . Ischemic cardiomyopathy     a. Echo 04/2015: EF 35%. Cath same time - 45%.  . Syncope   . Hyponatremia   . Anemia   . Thrombocytopenia ([place])   . Memory loss   . Hyperlipidemia     Medications:   (Not in a hospital admission) Assessment: 21 yoF w/ ESRD on HD-TThS. Presented to clinic with N/V/D x2 days, hypotension, and RLE infected  venous stasis ulcer. To be initiated on Zosyn for cellulitis.  WBC 7.9, afebrile.   Goal of Therapy:  Resolution of infection  Plan:  Zosyn 2.25 g q8h Expected duration 5 days with resolution of temperature and/or normalization of WBC Follow up culture results, LOT, transition to oral therapy  Governor Specking, PharmD Clinical Pharmacy Resident Pager: [phone] 09/16/2015,3:16 PM

## 2015-09-16 NOTE — ED Notes (Signed)
Pt. Left with all belongings. Discharge instructions were reviewed and all questions were answered.  

## 2015-09-16 NOTE — Discharge Instructions (Signed)
Take keflex 250 mg twice daily for a week.   Take zofran for nausea.  Stay hydrated.  See your doctor.  Get dialysis tomorrow.   Return to ER if you have worse vomiting, leg cellulitis, fevers.

## 2015-09-16 NOTE — Patient Instructions (Signed)
Please go to the Emergency Department for further management.  -Dr. Brett Albino

## 2015-09-16 NOTE — ED Notes (Addendum)
Pt from home for eval of emesis and diarrhea since Saturday morning, pt states had dialysis on Saturday and since then has not been able to keep any food or water down. Pt sent by pcp for possible dehydration. nad noted. Pt also reports weakness and unsteady gait since having n/v/d

## 2015-09-20 ENCOUNTER — Telehealth: Payer: Self-pay | Admitting: Internal Medicine

## 2015-09-20 NOTE — Telephone Encounter (Signed)
[facility] is calling for verbal orders for the patient's OT. The patient missed an appointment this week and they would like to make this up next week. Please call [facility] with verbal orders. jw

## 2015-09-23 ENCOUNTER — Other Ambulatory Visit (INDEPENDENT_AMBULATORY_CARE_PROVIDER_SITE_OTHER): Payer: Medicare Other | Admitting: *Deleted

## 2015-09-23 DIAGNOSIS — I2510 Atherosclerotic heart disease of native coronary artery without angina pectoris: Secondary | ICD-10-CM

## 2015-09-23 LAB — HEPATIC FUNCTION PANEL
ALT: 12 U/L (ref 6–29)
AST: 21 U/L (ref 10–35)
Albumin: 3.1 g/dL — ABNORMAL LOW (ref 3.6–5.1)
Alkaline Phosphatase: 100 U/L (ref 33–130)
BILIRUBIN DIRECT: 0.2 mg/dL (ref <=0.2)
BILIRUBIN INDIRECT: 0.5 mg/dL (ref 0.2–1.2)
TOTAL PROTEIN: 6.1 g/dL (ref 6.1–8.1)
Total Bilirubin: 0.7 mg/dL (ref 0.2–1.2)

## 2015-09-23 LAB — LIPID PANEL
CHOLESTEROL: 93 mg/dL — AB (ref 125–200)
HDL: 34 mg/dL — ABNORMAL LOW (ref >=46)
LDL Cholesterol: 43 mg/dL (ref <130)
TRIGLYCERIDES: 81 mg/dL (ref <150)
Total CHOL/HDL Ratio: 2.7 Ratio (ref <=5.0)
VLDL: 16 mg/dL (ref <30)

## 2015-09-24 NOTE — Telephone Encounter (Signed)
Will forward to MD to get verbal orders for this patient. Dorin Stooksbury,CMA

## 2015-09-24 NOTE — Telephone Encounter (Signed)
[facility] called again about the orders. She has already cancelled an appt with the pt but has another one scheduled for tomorrow morning.   Please advise

## 2015-09-25 ENCOUNTER — Ambulatory Visit (INDEPENDENT_AMBULATORY_CARE_PROVIDER_SITE_OTHER): Payer: Medicare Other | Admitting: Interventional Cardiology

## 2015-09-25 ENCOUNTER — Encounter: Payer: Medicare Other | Attending: Surgery | Admitting: Surgery

## 2015-09-25 ENCOUNTER — Other Ambulatory Visit
Admission: RE | Admit: 2015-09-25 | Discharge: 2015-09-25 | Disposition: A | Discharge status: Home / Self Care | Payer: Medicare Other | Source: Ambulatory Visit | Attending: Surgery | Admitting: Surgery

## 2015-09-25 ENCOUNTER — Encounter: Payer: Self-pay | Admitting: Interventional Cardiology

## 2015-09-25 VITALS — BP 80/52 | HR 65 | Ht 65.0 in | Wt 188.6 lb

## 2015-09-25 DIAGNOSIS — Z029 Encounter for administrative examinations, unspecified: Secondary | ICD-10-CM | POA: Diagnosis present

## 2015-09-25 DIAGNOSIS — Z992 Dependence on renal dialysis: Secondary | ICD-10-CM | POA: Insufficient documentation

## 2015-09-25 DIAGNOSIS — I255 Ischemic cardiomyopathy: Secondary | ICD-10-CM

## 2015-09-25 DIAGNOSIS — I2510 Atherosclerotic heart disease of native coronary artery without angina pectoris: Secondary | ICD-10-CM

## 2015-09-25 DIAGNOSIS — R55 Syncope and collapse: Secondary | ICD-10-CM | POA: Diagnosis not present

## 2015-09-25 DIAGNOSIS — E11622 Type 2 diabetes mellitus with other skin ulcer: Secondary | ICD-10-CM | POA: Insufficient documentation

## 2015-09-25 DIAGNOSIS — N186 End stage renal disease: Secondary | ICD-10-CM | POA: Diagnosis not present

## 2015-09-25 DIAGNOSIS — D649 Anemia, unspecified: Secondary | ICD-10-CM | POA: Diagnosis not present

## 2015-09-25 DIAGNOSIS — I83212 Varicose veins of right lower extremity with both ulcer of calf and inflammation: Secondary | ICD-10-CM | POA: Insufficient documentation

## 2015-09-25 DIAGNOSIS — L03115 Cellulitis of right lower limb: Secondary | ICD-10-CM | POA: Diagnosis not present

## 2015-09-25 DIAGNOSIS — I120 Hypertensive chronic kidney disease with stage 5 chronic kidney disease or end stage renal disease: Secondary | ICD-10-CM | POA: Insufficient documentation

## 2015-09-25 DIAGNOSIS — L97211 Non-pressure chronic ulcer of right calf limited to breakdown of skin: Secondary | ICD-10-CM | POA: Insufficient documentation

## 2015-09-25 DIAGNOSIS — E1140 Type 2 diabetes mellitus with diabetic neuropathy, unspecified: Secondary | ICD-10-CM | POA: Insufficient documentation

## 2015-09-25 MED ORDER — ISOSORBIDE MONONITRATE 10 MG PO TABS: 15.0000 mg | ORAL_TABLET | Freq: Every day | ORAL | Status: DC

## 2015-09-25 NOTE — Patient Instructions (Addendum)
Medication Instructions:  Your physician recommends that you continue on your current medications as directed. Please refer to the Current Medication list given to you today.   Labwork: None ordered  Testing/Procedures: Your physician has requested that you have an echocardiogram. Echocardiography is a painless test that uses sound waves to create images of your heart. It provides your doctor with information about the size and shape of your heart and how well your heart's chambers and valves are working. This procedure takes approximately one hour. There are no restrictions for this procedure. (To be scheduled prior to follow up appointment)  Follow-Up: Your physician recommends that you schedule a follow-up appointment in: January 2017   Any Other Special Instructions Will Be Listed Below (If Applicable).     If you need a refill on your cardiac medications before your next appointment, please call your pharmacy.

## 2015-09-25 NOTE — Telephone Encounter (Signed)
Please let agency know that I approve further therapy. Thank you

## 2015-09-25 NOTE — Progress Notes (Signed)
Cardiology Office Note   Date:  09/25/2015   ID:  [name] [name], DOB 1951/09/21, MRN [id number]  PCP:  Smiley Houseman, MD  Cardiologist:  [name], MD   Chief Complaint  Patient presents with  . Coronary Artery Disease      History of Present Illness: [name] [name] is a 64 y.o. female who presents for  End-stage kidney disease on dialysis, history of hypertension, history of syncope resulting in head trauma , diabetes mellitus ischemic cardiomyopathy with EF less than /equal to 35% , and diminished hearing.   The patient is been through a lot since having syncope and suffering head trauma in June 2016. Elevated troponin at that time led to coronary angiography. She was noted to have severe three-vessel coronary disease. She was seen by Dr. Prescott Gum and was turned down for surgery because of co-morbidities and frailty.  She denies chest discomfort. She denies indigestion/heartburn. She has mild dyspnea on exertion. She is tolerating dialysis without significant problems. There have been no episodes of recurrent syncope.  She is now at a rehabilitation facility and is accompanied today by her sister.  Since she was last seen, her husband died. She is moved out of her house. Her sister now lives with her.    Past Medical History  Diagnosis Date  . Hearing difficulty     Mild   . Diabetes mellitus ([place])   . Coronary artery disease     a. cath 6/16 Severe multivessel cad with diffuse significant segmental LAD stenosis, 95% stenosis in a large second diagonal, 75% ostial stenosis in the smaller first diagonal, 95% mid and 65% distal circumflex, 95% ostial/proximal PDA, and 40-50% left main ostial disease. Med rx initially, to be re-evaluated by CVTS.  Marland Kitchen ESRD (end stage renal disease) on dialysis ([place])        . Hypertension   . Ischemic cardiomyopathy     a. Echo 04/2015: EF 35%. Cath same time - 45%.  . Syncope   . Hyponatremia   . Anemia   . Thrombocytopenia  ([place])   . Memory loss   . Hyperlipidemia     Past Surgical History  Procedure Laterality Date  . 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 Esophagogastroduodenoscopy Left 02/20/2013    Procedure: ESOPHAGOGASTRODUODENOSCOPY (EGD);  Surgeon: Cleotis Nipper, MD;  Location: [facility] ENDOSCOPY;  Service: Endoscopy;  Laterality: Left;  . Av fistula placement Left 03/07/2013    Procedure: ARTERIOVENOUS (AV) FISTULA CREATION;  Surgeon: Elam Dutch, MD;  Location: [facility] OR;  Service: Vascular;  Laterality: Left;  left brachiocephalic AVF  . Cardiac catheterization N/A 04/25/2015    Procedure: Left Heart Cath and Coronary Angiography;  Surgeon: Belva Crome, MD;  Location: [facility] CV LAB;  Service: Cardiovascular;  Laterality: N/A;     Current Outpatient Prescriptions  Medication Sig Dispense Refill  . acetaminophen (TYLENOL) 500 MG tablet Take 500 mg by mouth every 6 (six) hours as needed for headache.    Marland Kitchen aspirin 81 MG tablet Take 81 mg by mouth daily.    Marland Kitchen atorvastatin (LIPITOR) 40 MG tablet Take 40 mg by mouth every morning.    . calcium carbonate (OS-CAL - DOSED IN MG OF ELEMENTAL CALCIUM) 1250 (500 CA) MG tablet Take two (2) tablets by mouth three (3) times daily with meals and take two (2) tablets by mouth with snacks.    . cephALEXin (KEFLEX) 250 MG capsule Take  1 capsule (250 mg total) by mouth 2 (two) times daily. 15 capsule 0  . docusate sodium (COLACE) 100 MG capsule Take 100 mg by mouth daily as needed for mild constipation or moderate constipation.    . fluticasone (FLONASE) 50 MCG/ACT nasal spray Place 1 spray into both nostrils daily as needed for allergies or rhinitis.    Marland Kitchen lanthanum (FOSRENOL) 1000 MG chewable tablet Chew 1,000 mg by mouth 3 (three) times daily with meals.    . multivitamin (RENA-VIT) TABS tablet Take 1 tablet by mouth at bedtime. 90 tablet 3  . ondansetron (ZOFRAN) 4 MG tablet Take 4 mg by mouth every 6 (six) hours as needed for nausea or  vomiting.    . pantoprazole (PROTONIX) 40 MG tablet Take 1 tablet (40 mg total) by mouth daily. 30 tablet 1  . promethazine (PHENERGAN) 25 MG tablet Take 25 mg by mouth every 6 (six) hours as needed for nausea or vomiting.    . isosorbide mononitrate (ISMO,MONOKET) 10 MG tablet Take 1.5 tablets (15 mg total) by mouth daily. 135 tablet 1   No current facility-administered medications for this visit.    Allergies:   Aleve; Motrin; and Nsaids    Social History:  The patient  reports that she has never smoked. She has never used smokeless tobacco. She reports that she does not drink alcohol or use illicit drugs.   Family History:  The patient's family history includes Bone cancer in her father; Diabetes in an other family member; Heart disease in an other family member; Stroke in an other family member; Throat cancer in her father.    ROS:  Please see the history of present illness.   Otherwise, review of systems are positive for  Cough, occasional diarrhea, hearing loss, leg swelling , particularly right leg where there is a chronic sore now being managed at a wound care center. Also there is snoring and wheezing according to the sister. She has occasional headaches..   All other systems are reviewed and negative.    PHYSICAL EXAM: VS:  BP 80/52 mmHg  Pulse 65  Ht 5\' 5"  (1.651 m)  Wt 85.548 kg (188 lb 9.6 oz)  BMI 31.38 kg/m2  SpO2 91% , BMI Body mass index is 31.38 kg/(m^2). GEN: Well nourished, well developed, in no acute distress HEENT: normal Neck: no JVD, carotid bruits, or masses Cardiac: RRR.  There is no murmur, rub, or gallop. There is moderate bilateral right > left edema. edema. Respiratory:  clear to auscultation bilaterally, normal work of breathing. GI: soft, nontender, nondistended, + BS MS: no deformity or atrophy Skin: warm and dry, no rash Neuro:  Strength and sensation are intact Psych: euthymic mood, full affect   EKG:  EKG is not ordered today.    Recent  Labs: 04/23/2015: B Natriuretic Peptide 3990.5* 04/25/2015: TSH 1.974 09/16/2015: BUN 27*; Creatinine, Ser 5.71*; Hemoglobin 11.5*; Platelets 165; Potassium 3.5; Sodium 135 09/23/2015: ALT 12    Lipid Panel    Component Value Date/Time   CHOL 93* 09/23/2015 0915   TRIG 81 09/23/2015 0915   HDL 34* 09/23/2015 0915   CHOLHDL 2.7 09/23/2015 0915   VLDL 16 09/23/2015 0915   LDLCALC 43 09/23/2015 0915      Wt Readings from Last 3 Encounters:  09/25/15 85.548 kg (188 lb 9.6 oz)  09/16/15 83.575 kg (184 lb 4 oz)  09/16/15 83.915 kg (185 lb)      Other studies Reviewed: Additional studies/ records that were reviewed today  include:  Reviewed coronary angiography data and consult by cardiac surgery.. The findings include one recommendation at the time of [facility]CTS consult was high risk PCI vs re-consult if she gets better..    ASSESSMENT AND PLAN:  1. CAD in native artery   Severe three-vessel coronary disease , without angina. Cautioned to call if any anginal symptoms.  2. ESRD (end stage renal disease) [facility])  Monday Wednesday Friday dialysis, being a relatively well tolerated.  3. Cardiomyopathy, ischemic , systolic  LVEF 68% in June 2016.   4. Syncope  Etiology never identified but presumed secondary to ischemic arrhythmia   5. Right lower extremity ulcer  Management per wound center. Once the lower extremity wound has healed, Will refer for reconsideration of bypass surgery.  Current medicines are reviewed at length with the patient today.  The patient has the following concerns regarding medicines: none.  The following changes/actions have been instituted:     2-D Doppler echocardiogram to follow-up LV function   Return in January for reevaluation and possible referral for  Reconsideration of surgery   Long discussion with the patient concerning treatment of her coronary disease. We discussed medical therapy versus high risk PCI versus CABG. Since the patient is markedly  improved compared to June when she was turned down for surgery.  Will re-consult Dr. Prescott Gum now that she has improved.. If she is truly not a surgical candidate, we will then discuss high risk culprit PCI.  Labs/ tests ordered today include:   Orders Placed This Encounter  Procedures  . Echocardiogram     Disposition:   FU with HS in 2 months  Signed, [name], MD  09/25/2015 [facility] [place], [place], [place]  [address] Phone: [phone]; Fax: [phone]

## 2015-09-25 NOTE — Telephone Encounter (Signed)
Advised Mallory as directed below and verbalized understanding. Joeph Szatkowski, CMA.

## 2015-09-27 NOTE — Progress Notes (Signed)
[name], [name] ([id number]) Visit Report for 09/25/2015 Abuse/Suicide Risk Screen Details Patient Name: [name] [name], [name] [name]: 09/25/2015 8:00 AM Medical Record Patient Account Number: [id number] Number: Treating RN: Montey Hora 1951-10-25 (64 y.o. Other Clinician: Date of Birth/Sex: Female) Treating BURNS III, Primary Care Physician/Extender: Roselyn Meier, Waldon Reining Physician: Referring Physician: Denton Brick in Treatment: 0 Abuse/Suicide Risk Screen Items Answer ABUSE/SUICIDE RISK SCREEN: Has anyone close to you tried to hurt or harm you recentlyo No Do you feel uncomfortable with anyone in your familyo No Has anyone forced you do things that you didnot want to doo No Do you have any thoughts of harming yourselfo No Patient displays signs or symptoms of abuse and/or neglect. No Electronic Signature(s) Signed: 09/26/2015 5:07:12 PM By: Montey Hora Entered By: Montey Hora on 09/25/2015 08:29:57 [name] [name], [name] [name] ([id number]) -------------------------------------------------------------------------------- Activities of Daily Living Details Patient Name: [name] [name]: 09/25/2015 8:00 AM Medical Record Patient Account Number: [id number] Number: Treating RN: Montey Hora 1950-12-26 (64 y.o. Other Clinician: Date of Birth/Sex: Female) Treating BURNS III, Primary Care Physician/Extender: Roselyn Meier, Waldon Reining Physician: Referring Physician: Denton Brick in Treatment: 0 Activities of Daily Living Items Answer Activities of Daily Living (Please select one for each item) Drive Automobile Not Able Take Medications Completely Able Use Telephone Completely Able Care for Appearance Completely Able Use Toilet Completely Able Bath / Shower Completely Able Dress Self Completely Able Feed Self Completely Able Walk Completely Able Get In / Out Bed Completely Able Housework Completely  Able Prepare Meals Completely [place] for Self Completely Able Electronic Signature(s) Signed: 09/26/2015 5:07:12 PM By: Montey Hora Entered By: Montey Hora on 09/25/2015 08:30:25 [name] [name], [name] [name] ([id number]) -------------------------------------------------------------------------------- Education Assessment Details Patient Name: [name] [name]: 09/25/2015 8:00 AM Medical Record Patient Account Number: [id number] Number: Treating RN: Montey Hora Feb 15, 1951 (64 y.o. Other Clinician: Date of Birth/Sex: Female) Treating BURNS III, Primary Care Physician/Extender: Roselyn Meier, Waldon Reining Physician: Referring Physician: Denton Brick in Treatment: 0 Primary Learner Assessed: Patient Learning Preferences/Education Level/Primary Language Learning Preference: Explanation, Demonstration Highest Education Level: College or Above Preferred Language: English Cognitive Barrier Assessment/Beliefs Language Barrier: No Translator Needed: No Memory Deficit: No Emotional Barrier: No Cultural/Religious Beliefs Affecting Medical No Care: Physical Barrier Assessment Impaired Vision: No Impaired Hearing: Yes Hearing Aid Decreased Hand dexterity: No Knowledge/Comprehension Assessment Knowledge Level: Medium Comprehension Level: Medium Ability to understand written Medium instructions: Ability to understand verbal Medium instructions: Motivation Assessment Anxiety Level: Calm Cooperation: Cooperative Education Importance: Acknowledges Need Interest in Health Problems: Asks Questions Perception: Coherent Willingness to Engage in Self- Medium Management Activities: Medium [name] [name], [name] [name] ([id number]) Readiness to Engage in Self- Management Activities: Electronic Signature(s) Signed: 09/26/2015 5:07:12 PM By: Montey Hora Entered By: Montey Hora on 09/25/2015 08:30:54 [name] [name], [name] [name]  ([id number]) -------------------------------------------------------------------------------- Fall Risk Assessment Details Patient Name: [name] [name]: 09/25/2015 8:00 AM Medical Record Patient Account Number: [id number] Number: Treating RN: Montey Hora 07-02-1951 (64 y.o. Other Clinician: Date of Birth/Sex: Female) Treating BURNS III, Primary Care Physician/Extender: Roselyn Meier, Waldon Reining Physician: Referring Physician: Denton Brick in Treatment: 0 Fall Risk Assessment Items FALL RISK ASSESSMENT: History of falling - immediate or within 3 months 25 Yes Secondary diagnosis 0 No Ambulatory aid None/bed rest/wheelchair/nurse 0 No Crutches/cane/walker 15 Yes Furniture 0 No IV Access/Saline Lock 0 No Gait/Training Normal/bed rest/immobile 0 No Weak 10 Yes Impaired 0 No Mental Status Oriented to own ability  0 Yes Electronic Signature(s) Signed: 09/26/2015 5:07:12 PM By: Montey Hora Entered By: Montey Hora on 09/25/2015 08:31:11 [name] [name], [name] [name] ([id number]) -------------------------------------------------------------------------------- Foot Assessment Details Patient Name: [name] [name]: 09/25/2015 8:00 AM Medical Record Patient Account Number: [id number] Number: Treating RN: Montey Hora 05-Sep-1951 (64 y.o. Other Clinician: Date of Birth/Sex: Female) Treating BURNS III, Primary Care Physician/Extender: Roselyn Meier, Waldon Reining Physician: Referring Physician: Denton Brick in Treatment: 0 Foot Assessment Items Site Locations + = Sensation present, - = Sensation absent, C = Callus, U = Ulcer R = Redness, W = Warmth, M = Maceration, PU = Pre-ulcerative lesion F = Fissure, S = Swelling, D = Dryness Assessment Right: Left: Other Deformity: No No Prior Foot Ulcer: No No Prior Amputation: No No Charcot Joint: No No Ambulatory Status: Ambulatory With Help Assistance Device:  Walker Gait: Steady Electronic Signature(s) Signed: 09/26/2015 5:07:12 PM By: Margit Banda, [name] [name] ([id number]) Entered By: Montey Hora on 09/25/2015 08:37:56 [name] [name], [name] [name] ([id number]) -------------------------------------------------------------------------------- Nutrition Risk Assessment Details Patient Name: [name] [name]: 09/25/2015 8:00 AM Medical Record Patient Account Number: [id number] Number: Treating RN: Montey Hora [birth year] (64 y.o. Other Clinician: Date of Birth/Sex: Female) Treating BURNS III, Primary Care Physician/Extender: Roselyn Meier, Waldon Reining Physician: Referring Physician: Denton Brick in Treatment: 0 Height (in): 64 Weight (lbs): 188 Body Mass Index (BMI): 32.3 Nutrition Risk Assessment Items NUTRITION RISK SCREEN: I have an illness or condition that made me change the kind and/or 0 No amount of food I eat I eat fewer than two meals per day 0 No I eat few fruits and vegetables, or milk products 0 No I have three or more drinks of beer, liquor or wine almost every day 0 No I have tooth or mouth problems that make it hard for me to eat 0 No I don't always have enough money to buy the food I need 0 No I eat alone most of the time 0 No I take three or more different prescribed or over-the-counter drugs a 1 Yes day Without wanting to, I have lost or gained 10 pounds in the last six 0 No months I am not always physically able to shop, cook and/or feed myself 0 No Nutrition Protocols Good Risk Protocol 0 No interventions needed Moderate Risk Protocol Electronic Signature(s) Signed: 09/26/2015 5:07:12 PM By: Montey Hora Entered By: Montey Hora on 09/25/2015 08:31:19

## 2015-09-27 NOTE — Progress Notes (Signed)
[name] [name], [name] [name] ([id number]) Visit Report for 09/25/2015 Allergy List Details Patient Name: [name] [name], [name] [name] Date of Service: 09/25/2015 8:00 AM Medical Record Patient Account Number: [id number] Number: Treating RN: [name] [name] 1951-04-20 (64 y.o. Other Clinician: Date of Birth/Sex: Female) Treating BURNS III, Primary Care Physician: [name] [name] Physician/Extender: [name] [name] Referring Physician: Denton [name] in Treatment: 0 Allergies Active Allergies NSAIDS (Non-Steroidal Anti-Inflammatory Drug) Allergy Notes Electronic Signature(s) Signed: 09/26/2015 5:07:12 PM By: [name] [name] Entered By: [name] [name] on 09/25/2015 08:24:13 [name] [name] ([id number]) -------------------------------------------------------------------------------- Arrival Information Details Patient Name: [name] [name] Date of Service: 09/25/2015 8:00 AM Medical Record Patient Account Number: [id number] Number: Treating RN: [name] [name] 01-05-1951 (64 y.o. Other Clinician: Date of Birth/Sex: Female) Treating BURNS III, Primary Care Physician: [name] [name] Physician/Extender: [name] [name] Referring Physician: Denton [name] in Treatment: 0 Visit Information Patient Arrived: Walker Arrival Time: 08:16 Accompanied By: self Transfer Assistance: None Patient Identification Verified: Yes Secondary Verification Process Yes Completed: Patient Has Alerts: Yes Patient Alerts: DMII aspirin 81 Electronic Signature(s) Signed: 09/26/2015 5:07:12 PM By: [name] [name] Entered By: [name] [name] on 09/25/2015 08:25:42 [name] [name] ([id number]) -------------------------------------------------------------------------------- Clinic Level of Care Assessment Details Patient Name: [name] [name] Date of Service: 09/25/2015 8:00 AM Medical Record Patient Account Number: [id number] Number: Treating RN: [name] [name] 10/14/1951 (64 y.o.  Other Clinician: Date of Birth/Sex: Female) Treating BURNS III, Primary Care Physician: [name] [name] Physician/Extender: [name] [name] Referring Physician: Denton [name] in Treatment: 0 Clinic Level of Care Assessment Items TOOL 2 Quantity Score []  - Use when only an EandM is performed on the INITIAL visit 0 ASSESSMENTS - Nursing Assessment / Reassessment X - General Physical Exam (combine w/ comprehensive assessment (listed just 1 20 below) when performed on new pt. evals) X - Comprehensive Assessment (HX, ROS, Risk Assessments, Wounds Hx, etc.) 1 25 ASSESSMENTS - Wound and Skin Assessment / Reassessment X - Simple Wound Assessment / Reassessment - one wound 1 5 []  - Complex Wound Assessment / Reassessment - multiple wounds 0 []  - Dermatologic / Skin Assessment (not related to wound area) 0 ASSESSMENTS - Ostomy and/or Continence Assessment and Care []  - Incontinence Assessment and Management 0 []  - Ostomy Care Assessment and Management (repouching, etc.) 0 PROCESS - Coordination of Care X - Simple Patient / Family Education for ongoing care 1 15 []  - Complex (extensive) Patient / Family Education for ongoing care 0 X - Staff obtains Programmer, systems, Records, Test Results / Process Orders 1 10 []  - Staff telephones HHA, Nursing Homes / Clarify orders / etc 0 []  - Routine Transfer to another Facility (non-emergent condition) 0 []  - Routine Hospital Admission (non-emergent condition) 0 X - New Admissions / Biomedical engineer / Ordering NPWT, Apligraf, etc. 1 15 []  - Emergency Hospital Admission (emergent condition) 0 [name], [name] ([id number]) X - Simple Discharge Coordination 1 10 []  - Complex (extensive) Discharge Coordination 0 PROCESS - Special Needs []  - Pediatric / Minor Patient Management 0 []  - Isolation Patient Management 0 []  - Hearing / Language / Visual special needs 0 []  - Assessment of Community assistance (transportation, D/C planning, etc.) 0 []  -  Additional assistance / Altered mentation 0 []  - Support Surface(s) Assessment (bed, cushion, seat, etc.) 0 INTERVENTIONS - Wound Cleansing / Measurement X - Wound Imaging (photographs - any number of wounds) 1 5 []  - Wound Tracing (instead of photographs) 0 X - Simple Wound Measurement - one wound 1 5 []  - Complex Wound Measurement - multiple wounds  0 X - Simple Wound Cleansing - one wound 1 5 []  - Complex Wound Cleansing - multiple wounds 0 INTERVENTIONS - Wound Dressings []  - Small Wound Dressing one or multiple wounds 0 X - Medium Wound Dressing one or multiple wounds 1 15 []  - Large Wound Dressing one or multiple wounds 0 []  - Application of Medications - injection 0 INTERVENTIONS - Miscellaneous []  - External ear exam 0 []  - Specimen Collection (cultures, biopsies, blood, body fluids, etc.) 0 []  - Specimen(s) / Culture(s) sent or taken to Lab for analysis 0 []  - Patient Transfer (multiple staff / Harrel Lemon Lift / Similar devices) 0 []  - Simple Staple / Suture removal (25 or less) 0 [name], [name] ([id number]) []  - Complex Staple / Suture removal (26 or more) 0 []  - Hypo / Hyperglycemic Management (close monitor of Blood Glucose) 0 X - Ankle / Brachial Index (ABI) - do not check if billed separately 1 15 Has the patient been seen at the hospital within the last three years: Yes Total Score: 145 Level Of Care: New/Established - Level 4 Electronic Signature(s) Signed: 09/26/2015 5:07:12 PM By: [name] [name] Entered By: [name] [name] on 09/25/2015 09:04:20 [name] [name] ([id number]) -------------------------------------------------------------------------------- Encounter Discharge Information Details Patient Name: [name] [name] Date of Service: 09/25/2015 8:00 AM Medical Record Patient Account Number: [id number] Number: Treating RN: [name] [name] July 23, 1951 (64 y.o. Other Clinician: Date of Birth/Sex: Female) Treating BURNS III, Primary Care Physician:  [name] [name] Physician/Extender: [name] [name] Referring Physician: Denton [name] in Treatment: 0 Encounter Discharge Information Items Schedule Follow-up Appointment: No Medication Reconciliation completed No and provided to Patient/Care Sayid Moll: Provided on Clinical Summary of Care: 09/25/2015 Form Type Recipient Paper Patient SL Electronic Signature(s) Signed: 09/25/2015 9:19:18 AM By: Ruthine Dose Entered By: Ruthine Dose on 09/25/2015 09:19:17 [place], [name] [name] ([id number]) -------------------------------------------------------------------------------- Lower Extremity Assessment Details Patient Name: [name] [name] Date of Service: 09/25/2015 8:00 AM Medical Record Patient Account Number: [id number] Number: Treating RN: [name] [name] 05/22/1951 (64 y.o. Other Clinician: Date of Birth/Sex: Female) Treating BURNS III, Primary Care Physician: [name] [name] Physician/Extender: [name] [name] Referring Physician: Denton [name] in Treatment: 0 Edema Assessment Assessed: [Left: No] [Right: No] Edema: [Left: Yes] [Right: Yes] Calf Left: Right: Point of Measurement: 32 cm From Medial Instep 40.5 cm 40.2 cm Ankle Left: Right: Point of Measurement: 11 cm From Medial Instep 21.7 cm 22.3 cm Vascular Assessment Claudication: Claudication Assessment [Left:None] [Right:None] Pulses: Posterior Tibial Palpable: [Left:Yes] [Right:Yes] Dorsalis Pedis Palpable: [Left:Yes] [Right:Yes] Extremity colors, hair growth, and conditions: Extremity Color: [Left:Hyperpigmented] [Right:Hyperpigmented] Hair Growth on Extremity: [Left:Yes] [Right:Yes] Temperature of Extremity: [Left:Warm] [Right:Warm] Capillary Refill: [Left:< 3 seconds] [Right:< 3 seconds] Blood Pressure: Brachial: [Right:130] Dorsalis Pedis: 150 [Left:Dorsalis Pedis:] Ankle: Posterior Tibial: [Left:Posterior Tibial: 1.15] Toe Nail Assessment Left: Right: Thick: No No Discolored: No  No Sweeney, Anokhi W. ([id number]) Deformed: No No Improper Length and Hygiene: No No Electronic Signature(s) Signed: 09/26/2015 5:07:12 PM By: [name] [name] Entered By: [name] [name] on 09/25/2015 08:48:59 Rowley, [name] [name] ([id number]) -------------------------------------------------------------------------------- Multi Wound Chart Details Patient Name: [name] [name] Date of Service: 09/25/2015 8:00 AM Medical Record Patient Account Number: [id number] Number: Treating RN: [name] [name] [birth year] (64 y.o. Other Clinician: Date of Birth/Sex: Female) Treating BURNS III, Primary Care Physician: [name] [name] Physician/Extender: [name] [name] Referring Physician: Denton [name] in Treatment: 0 Vital Signs Height(in): 64 Pulse(bpm): 63 Weight(lbs): 188 Blood Pressure 129/60 (mmHg): Body Mass Index(BMI): 32 Temperature(F): 97.5 Respiratory Rate 18 (breaths/min): Photos: [1:No Photos] [N/A:N/A] Wound Location: [  1:Right Lower Leg - Lateral] [N/A:N/A] Wounding Event: [1:Blister] [N/A:N/A] Primary Etiology: [1:Venous Leg Ulcer] [N/A:N/A] Comorbid History: [1:Anemia, Coronary Artery Disease, Hypertension, Type II Diabetes, Neuropathy] [N/A:N/A] Date Acquired: [1:09/04/2015] [N/A:N/A] Weeks of Treatment: [1:0] [N/A:N/A] Wound Status: [1:Open] [N/A:N/A] Clustered Wound: [1:Yes] [N/A:N/A] Measurements L x W x D 12.4x2.5x0.1 [N/A:N/A] (cm) Area (cm) : [1:24.347] [N/A:N/A] Volume (cm) : [1:2.435] [N/A:N/A] Classification: [1:Partial Thickness] [N/A:N/A] HBO Classification: [1:Grade 1] [N/A:N/A] Exudate Amount: [1:Large] [N/A:N/A] Exudate Type: [1:Serous] [N/A:N/A] Exudate Color: [1:amber] [N/A:N/A] Wound Margin: [1:Flat and Intact] [N/A:N/A] Granulation Amount: [1:Large (67-100%)] [N/A:N/A] Granulation Quality: [1:Pink] [N/A:N/A] Necrotic Amount: [1:Small (1-33%)] [N/A:N/A] Exposed Structures: [1:Fascia: No Fat: No Tendon: No] [N/A:N/A] Muscle:  No Joint: No Bone: No Limited to Skin Breakdown Epithelialization: None N/A N/A Periwound Skin Texture: Edema: No N/A N/A Excoriation: No Induration: No Callus: No Crepitus: No Fluctuance: No Friable: No Rash: No Scarring: No Periwound Skin Maceration: No N/A N/A Moisture: Moist: No Dry/Scaly: No Periwound Skin Color: Erythema: Yes N/A N/A Atrophie Blanche: No Cyanosis: No Ecchymosis: No Hemosiderin Staining: No Mottled: No Pallor: No Rubor: No Erythema Location: Circumferential N/A N/A Tenderness on Yes N/A N/A Palpation: Wound Preparation: Ulcer Cleansing: N/A N/A Rinsed/Irrigated with Saline Topical Anesthetic Applied: Other: lidocaine 4% Treatment Notes Electronic Signature(s) Signed: 09/26/2015 5:07:12 PM By: [name] [name] Entered By: [name] [name] on 09/25/2015 09:00:02 [place], [name] [name] ([id number]) -------------------------------------------------------------------------------- Multi-Disciplinary Care Plan Details Patient Name: [name] [name] Date of Service: 09/25/2015 8:00 AM Medical Record Patient Account Number: [id number] Number: Treating RN: [name] [name] March 14, 1951 (64 y.o. Other Clinician: Date of Birth/Sex: Female) Treating BURNS III, Primary Care Physician: [name] [name] Physician/Extender: [name] [name] Referring Physician: Denton [name] in Treatment: 0 Active Inactive Abuse / Safety / Falls / Self Care Management Nursing Diagnoses: Impaired physical mobility Potential for falls Goals: Patient will remain injury free Date Initiated: 09/25/2015 Goal Status: Active Interventions: Assess fall risk on admission and as needed Notes: Orientation to the Wound Care Program Nursing Diagnoses: Knowledge deficit related to the wound healing center program Goals: Patient/caregiver will verbalize understanding of the [facility] Date Initiated: 09/25/2015 Goal Status: Active Interventions: Provide  education on orientation to the wound center Notes: Venous Leg Ulcer Nursing Diagnoses: Potential for venous Insuffiency (use before diagnosis confirmed) [place], [name] [name] ([id number]) Goals: Patient will maintain optimal edema control Date Initiated: 09/25/2015 Goal Status: Active Interventions: Assess peripheral edema status every visit. Notes: Wound/Skin Impairment Nursing Diagnoses: Impaired tissue integrity Goals: Patient/caregiver will verbalize understanding of skin care regimen Date Initiated: 09/25/2015 Goal Status: Active Ulcer/skin breakdown will have a volume reduction of 30% by week 4 Date Initiated: 09/25/2015 Goal Status: Active Ulcer/skin breakdown will have a volume reduction of 50% by week 8 Date Initiated: 09/25/2015 Goal Status: Active Ulcer/skin breakdown will have a volume reduction of 80% by week 12 Date Initiated: 09/25/2015 Goal Status: Active Ulcer/skin breakdown will heal within 14 weeks Date Initiated: 09/25/2015 Goal Status: Active Interventions: Assess patient/caregiver ability to perform ulcer/skin care regimen upon admission and as needed Assess ulceration(s) every visit Notes: Electronic Signature(s) Signed: 09/26/2015 5:07:12 PM By: [name] [name] Entered By: [name] [name] on 09/25/2015 08:59:50 [place], [name] [name] ([id number]) -------------------------------------------------------------------------------- Patient/Caregiver Education Details Patient Name: [name] [name] Date of Service: 09/25/2015 8:00 AM Medical Record Patient Account Number: [id number] Number: Treating RN: [name] [name] May 10, 1951 (64 y.o. Other Clinician: Date of Birth/Gender: Female) Treating BURNS III, Primary Care Physician: [name] [name] Physician/Extender: [name] [name] Referring Physician: Denton [name] in Treatment: 0 Education Assessment Education Provided To:  Patient Education Topics Provided Wound/Skin Impairment: Handouts: Other:  wound care as ordered Methods: Demonstration, Explain/Verbal Responses: State content correctly Electronic Signature(s) Signed: 09/26/2015 5:07:12 PM By: [name] [name] Entered By: [name] [name] on 09/25/2015 09:24:25 Thresher, [name] [name] ([id number]) -------------------------------------------------------------------------------- Wound Assessment Details Patient Name: [name] [name] Date of Service: 09/25/2015 8:00 AM Medical Record Patient Account Number: [id number] Number: Treating RN: [name] [name] 02-14-1951 (64 y.o. Other Clinician: Date of Birth/Sex: Female) Treating BURNS III, Primary Care Physician: [name] [name] Physician/Extender: [name] [name] Referring Physician: Denton [name] in Treatment: 0 Wound Status Wound Number: 1 Primary Venous Leg Ulcer Etiology: Wound Location: Right Lower Leg - Lateral Wound Open Wounding Event: Blister Status: Date Acquired: 09/04/2015 Comorbid Anemia, Coronary Artery Disease, Weeks Of Treatment: 0 History: Hypertension, Type II Diabetes, Clustered Wound: Yes Neuropathy Photos Photo Uploaded By: [name] [name] on 09/25/2015 11:51:45 Wound Measurements Length: (cm) 12.4 Width: (cm) 2.5 Depth: (cm) 0.1 Area: (cm) 24.347 Volume: (cm) 2.435 % Reduction in Area: % Reduction in Volume: Epithelialization: None Tunneling: No Undermining: No Wound Description Classification: Partial Thickness Foul Odor Af Diabetic Severity Earleen Newport): Grade 1 Wound Margin: Flat and Intact Exudate Amount: Large Exudate Type: Serous Exudate Color: amber ter Cleansing: No Wound Bed Granulation Amount: Large (67-100%) Exposed Structure [place], [place] ([id number]) Granulation Quality: Pink Fascia Exposed: No Necrotic Amount: Small (1-33%) Fat Layer Exposed: No Necrotic Quality: Adherent Slough Tendon Exposed: No Muscle Exposed: No Joint Exposed: No Bone Exposed: No Limited to Skin Breakdown Periwound Skin  Texture Texture Color No Abnormalities Noted: No No Abnormalities Noted: No Callus: No Atrophie Blanche: No Crepitus: No Cyanosis: No Excoriation: No Ecchymosis: No Fluctuance: No Erythema: Yes Friable: No Erythema Location: Circumferential Induration: No Hemosiderin Staining: No Localized Edema: No Mottled: No Rash: No Pallor: No Scarring: No Rubor: No Moisture Temperature / Pain No Abnormalities Noted: No Tenderness on Palpation: Yes Dry / Scaly: No Maceration: No Moist: No Wound Preparation Ulcer Cleansing: Rinsed/Irrigated with Saline Topical Anesthetic Applied: Other: lidocaine 4%, Treatment Notes Wound #1 (Right, Lateral Lower Leg) 1. Cleansed with: Clean wound with Normal Saline 2. Anesthetic Topical Lidocaine 4% cream to wound bed prior to debridement 4. Dressing Applied: Aquacel Ag 5. Secondary Dressing Applied ABD and Kerlix/Conform 7. Secured with Tubigrip Notes stretch netting Electronic Signature(s) PARADISE, HECKSEL ([id number]) Signed: 09/26/2015 5:07:12 PM By: [name] [name] Entered By: [name] [name] on 09/25/2015 08:40:29 Haroon, [name] [name] ([id number]) -------------------------------------------------------------------------------- Vitals Details Patient Name: [name] [name] Date of Service: 09/25/2015 8:00 AM Medical Record Patient Account Number: [id number] Number: Treating RN: [name] [name] [birth year] (64 y.o. Other Clinician: Date of Birth/Sex: Female) Treating BURNS III, Primary Care Physician: [name] [name] Physician/Extender: [name] [name] Referring Physician: Denton [name] in Treatment: 0 Vital Signs Time Taken: 08:21 Temperature (F): 97.5 Height (in): 64 Pulse (bpm): 63 Source: Stated Respiratory Rate (breaths/min): 18 Weight (lbs): 188 Blood Pressure (mmHg): 129/60 Source: Stated Reference Range: 80 - 120 mg / dl Body Mass Index (BMI): 32.3 Electronic Signature(s) Signed: 09/26/2015 5:07:12  PM By: [name] [name] Entered By: [name] [name] on 09/25/2015 08:24:02

## 2015-09-27 NOTE — Progress Notes (Signed)
SARE, JIMMY ([id number]) Visit Report for 09/25/2015 Chief Complaint Document Details Patient Name: [name] [name], [name] [name] Date of Service: 09/25/2015 8:00 AM Medical Record Patient Account Number: [id number] Number: Treating RN: Montey Hora 1950/12/11 (64 y.o. Other Clinician: Date of Birth/Sex: Female) Treating BURNS III, Primary Care Physician/Extender: Roselyn Meier, Waldon Reining Physician: Referring Physician: Denton Brick in Treatment: 0 Information Obtained from: Patient Chief Complaint Right calf ulcer. Cellulitis. Electronic Signature(s) Signed: 09/25/2015 4:26:05 PM By: Loletha Grayer MD Entered By: Loletha Grayer on 09/25/2015 13:47:42 Housman, [name] ([id number]) -------------------------------------------------------------------------------- HPI Details Patient Name: [name] [name] Date of Service: 09/25/2015 8:00 AM Medical Record Patient Account Number: [id number] Number: Treating RN: Montey Hora Mar 08, 1951 (64 y.o. Other Clinician: Date of Birth/Sex: Female) Treating BURNS III, Primary Care Physician/Extender: Georgina Snell Physician: Referring Physician: Denton Brick in Treatment: 0 History of Present Illness HPI Description: Pleasant 64 year old with history of type 2 diabetes (hemoglobin A1c 5.1 in Oct 2016), end-stage renal disease (on hemodialysis), and coronary artery disease. No history of PAD. Recently developed recurrent right calf ulcerations and cellulitis. Receiving vancomycin and ceftazidime at dialysis with mild improvement. Culture grew staph aureus. No sensitivities available. She does not currently wear any compression. She says that she has undergone treatment with compression bandages in the past for prior ulcerations. She performs minimal ambulation with a walker. No claudication or rest pain. ABI not performed on the right secondary to painful ulcerations. ABI on the left  was 1.15. Palpable pedal pulses. She has not been performing any dressing changes. No fever or chills. Moderate drainage. Electronic Signature(s) Signed: 09/25/2015 4:26:05 PM By: Loletha Grayer MD Entered By: Loletha Grayer on 09/25/2015 13:54:38 Lindvall, [name] ([id number]) -------------------------------------------------------------------------------- Physical Exam Details Patient Name: [name] [name] Date of Service: 09/25/2015 8:00 AM Medical Record Patient Account Number: [id number] Number: Treating RN: Montey Hora [birth year] (64 y.o. Other Clinician: Date of Birth/Sex: Female) Treating BURNS III, Primary Care Physician/Extender: Roselyn Meier, Waldon Reining Physician: Referring Physician: Denton Brick in Treatment: 0 Constitutional . Pulse regular. Respirations normal and unlabored. Afebrile. Marland Kitchen Respiratory WNL. No retractions.. Cardiovascular Pedal Pulses WNL. Integumentary (Hair, Skin) .Marland Kitchen Neurological Sensation normal to touch, pin,and vibration. Psychiatric Judgement and insight Intact.. Oriented times 3.. No evidence of depression, anxiety, or agitation.. Notes Right calf ulcerations. Full-thickness. Biofilm wiped free with gauze. Healthy underlying granulation tissue. No surgical debridement performed today secondary to patient discomfort. Swab culture obtained. Moderate cellulitis. 2+ pitting edema. Palpable DP. ABI not obtained on right. Left ABI 1.15. Electronic Signature(s) Signed: 09/25/2015 4:26:05 PM By: Loletha Grayer MD Entered By: Loletha Grayer on 09/25/2015 13:56:23 Kitzmiller, [name] ([id number]) -------------------------------------------------------------------------------- Physician Orders Details Patient Name: [name] [name] Date of Service: 09/25/2015 8:00 AM Medical Record Patient Account Number: [id number] Number: Treating RN: Montey Hora 06/07/1951 (64 y.o. Other Clinician: Date of  Birth/Sex: Female) Treating BURNS III, Primary Care Physician/Extender: Roselyn Meier, Waldon Reining Physician: Referring Physician: Denton Brick in Treatment: 0 Verbal / Phone Orders: Yes Clinician: Montey Hora Read Back and Verified: Yes Diagnosis Coding Wound Cleansing Wound #1 Right,Lateral Lower Leg o Clean wound with Normal Saline. Anesthetic Wound #1 Right,Lateral Lower Leg o Topical Lidocaine 4% cream applied to wound bed prior to debridement Primary Wound Dressing Wound #1 Right,Lateral Lower Leg o Aquacel Ag Secondary Dressing Wound #1 Right,Lateral Lower Leg o ABD and Kerlix/Conform - stretch netting Dressing Change Frequency Wound #1 Right,Lateral Lower Leg o Change dressing every other day. Follow-up  Appointments Wound #1 Right,Lateral Lower Leg o Return Appointment in 1 week. Edema Control Wound #1 Right,Lateral Lower Leg o Tubigrip Home Health Wound #1 Right,Lateral Lower Leg o Continue Home Health Visits - [place] Nurse may visit PRN to address patientos wound care needs. DAHIANA, CICCARELLI ([id number]) o FACE TO FACE ENCOUNTER: MEDICARE and MEDICAID PATIENTS: I certify that this patient is under my care and that I had a face-to-face encounter that meets the physician face-to-face encounter requirements with this patient on this date. The encounter with the patient was in whole or in part for the following MEDICAL CONDITION: (primary reason for [place]) MEDICAL NECESSITY: I certify, that based on my findings, NURSING services are a medically necessary home health service. HOME BOUND STATUS: I certify that my clinical findings support that this patient is homebound (i.e., Due to illness or injury, pt requires aid of supportive devices such as crutches, cane, wheelchairs, walkers, the use of special transportation or the assistance of another person to leave their place of residence. There is a normal  inability to leave the home and doing so requires considerable and taxing effort. Other absences are for medical reasons / religious services and are infrequent or of short duration when for other reasons). o If current dressing causes regression in wound condition, may D/C ordered dressing product/s and apply Normal Saline Moist Dressing daily until next [place] / Other MD appointment. [place] of regression in wound condition at [phone]. o Please direct any NON-WOUND related issues/requests for orders to patient's Primary Care Physician Laboratory o Culture and Sensitivity - right lower leg oooo Electronic Signature(s) Signed: 09/25/2015 4:26:05 PM By: Loletha Grayer MD Signed: 09/26/2015 5:07:12 PM By: Montey Hora Entered By: Montey Hora on 09/25/2015 09:02:44 Posey, [name] ([id number]) -------------------------------------------------------------------------------- Problem List Details Patient Name: [name] [name] Date of Service: 09/25/2015 8:00 AM Medical Record Patient Account Number: [id number] Number: Treating RN: Montey Hora May 10, 1951 (64 y.o. Other Clinician: Date of Birth/Sex: Female) Treating BURNS III, Primary Care Physician/Extender: Roselyn Meier, Waldon Reining Physician: Referring Physician: Denton Brick in Treatment: 0 Active Problems ICD-10 Encounter Code Description Active Date Diagnosis I83.212 Varicose veins of right lower extremity with both ulcer of 09/25/2015 Yes calf and inflammation L03.115 Cellulitis of right lower limb 09/25/2015 Yes E11.622 Type 2 diabetes mellitus with other skin ulcer 09/25/2015 Yes N18.6 End stage renal disease 09/25/2015 Yes I25.10 Atherosclerotic heart disease of native coronary artery 09/25/2015 Yes without angina pectoris Inactive Problems Resolved Problems Electronic Signature(s) Signed: 09/25/2015 4:26:05 PM By: Loletha Grayer MD Entered  By: Loletha Grayer on 09/25/2015 13:47:16 Dilling, [name] ([id number]) -------------------------------------------------------------------------------- Progress Note/History and Physical Details Patient Name: [name] [name] Date of Service: 09/25/2015 8:00 AM Medical Record Patient Account Number: [id number] Number: Treating RN: Montey Hora 24-Aug-1951 (64 y.o. Other Clinician: Date of Birth/Sex: Female) Treating BURNS III, Primary Care Physician/Extender: Roselyn Meier, Waldon Reining Physician: Referring Physician: Denton Brick in Treatment: 0 Subjective Chief Complaint Information obtained from Patient Right calf ulcer. Cellulitis. History of Present Illness (HPI) Pleasant 64 year old with history of type 2 diabetes (hemoglobin A1c 5.1 in Oct 2016), end-stage renal disease (on hemodialysis), and coronary artery disease. No history of PAD. Recently developed recurrent right calf ulcerations and cellulitis. Receiving vancomycin and ceftazidime at dialysis with mild improvement. Culture grew staph aureus. No sensitivities available. She does not currently wear any compression. She says that she has undergone treatment with compression bandages in the past  for prior ulcerations. She performs minimal ambulation with a walker. No claudication or rest pain. ABI not performed on the right secondary to painful ulcerations. ABI on the left was 1.15. Palpable pedal pulses. She has not been performing any dressing changes. No fever or chills. Moderate drainage. Wound History Patient presents with 1 open wound that has been present for approximately 2-3 weeks. Patient has been treating wound in the following manner: open to air . Laboratory tests have been performed in the last month. Patient reportedly has not tested positive for an antibiotic resistant organism. Patient reportedly has not tested positive for osteomyelitis. Patient reportedly has not had testing  performed to evaluate circulation in the legs. Patient experiences the following problems associated with their wounds: swelling. Patient History Information obtained from Patient. Allergies NSAIDS (Non-Steroidal Anti-Inflammatory Drug) Social History Never smoker, Marital Status - Widowed, Alcohol Use - Never, Drug Use - No History, Caffeine Use - Never. EVELY, BULICK ([id number]) Medical History Hematologic/Lymphatic Patient has history of Anemia Cardiovascular Patient has history of Coronary Artery Disease, Hypertension Endocrine Patient has history of Type II Diabetes Neurologic Patient has history of Neuropathy Oncologic Denies history of Received Chemotherapy, Received Radiation Patient is treated with Controlled Diet. Blood sugar is tested. Hospitalization/Surgery History - 07/02/2015, [place], fall. Medical And Surgical History Notes Ear/Nose/Mouth/Throat HOH - hearing aids Review of Systems (ROS) Constitutional Symptoms (General Health) The patient has no complaints or symptoms. Eyes The patient has no complaints or symptoms. Hematologic/Lymphatic The patient has no complaints or symptoms. Respiratory The patient has no complaints or symptoms. Cardiovascular Complains or has symptoms of LE edema. Gastrointestinal The patient has no complaints or symptoms. Genitourinary Complains or has symptoms of Kidney failure/ Dialysis - ESRD. Immunological The patient has no complaints or symptoms. Integumentary (Skin) Complains or has symptoms of Wounds - same type about 6 months. Musculoskeletal The patient has no complaints or symptoms. Oncologic The patient has no complaints or symptoms. Psychiatric The patient has no complaints or symptoms. Objective Dolley, GEETHIKA GLEDHILL. ([id number]) Constitutional Pulse regular. Respirations normal and unlabored. Afebrile. Vitals Time Taken: 8:21 AM, Height: 64 in, Source: Stated, Weight: 188 lbs, Source: Stated, BMI:  32.3, Temperature: 97.5 F, Pulse: 63 bpm, Respiratory Rate: 18 breaths/min, Blood Pressure: 129/60 mmHg. Respiratory WNL. No retractions.. Cardiovascular Pedal Pulses WNL. Neurological Sensation normal to touch, pin,and vibration. Psychiatric Judgement and insight Intact.. Oriented times 3.. No evidence of depression, anxiety, or agitation.. General Notes: Right calf ulcerations. Full-thickness. Biofilm wiped free with gauze. Healthy underlying granulation tissue. No surgical debridement performed today secondary to patient discomfort. Swab culture obtained. Moderate cellulitis. 2+ pitting edema. Palpable DP. ABI not obtained on right. Left ABI 1.15. Integumentary (Hair, Skin) Wound #1 status is Open. Original cause of wound was Blister. The wound is located on the Right,Lateral Lower Leg. The wound measures 12.4cm length x 2.5cm width x 0.1cm depth; 24.347cm^2 area and 2.435cm^3 volume. The wound is limited to skin breakdown. There is no tunneling or undermining noted. There is a large amount of serous drainage noted. The wound margin is flat and intact. There is large (67- 100%) pink granulation within the wound bed. There is a small (1-33%) amount of necrotic tissue within the wound bed including Adherent Slough. The periwound skin appearance exhibited: Erythema. The periwound skin appearance did not exhibit: Callus, Crepitus, Excoriation, Fluctuance, Friable, Induration, Localized Edema, Rash, Scarring, Dry/Scaly, Maceration, Moist, Atrophie Blanche, Cyanosis, Ecchymosis, Hemosiderin Staining, Mottled, Pallor, Rubor. The surrounding wound skin color is noted with erythema which  is circumferential. The periwound has tenderness on palpation. 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 Phlebolymphedema. Plan Wound Cleansing: Wound #1 Right,Lateral Lower Leg: Clean wound with Normal Saline. Anesthetic: Wound #1 Right,Lateral Lower Leg: Topical Lidocaine 4% cream applied to wound bed prior to debridement Primary Wound Dressing: Wound #1 Right,Lateral Lower Leg: Aquacel Ag Secondary Dressing: Wound #1 Right,Lateral Lower Leg: ABD and Kerlix/Conform - stretch netting Dressing Change Frequency: Wound #1 Right,Lateral Lower Leg: Change dressing every other day. Follow-up Appointments: Wound #1 Right,Lateral Lower Leg: Return Appointment in 1 week. Edema Control: Wound #1 Right,Lateral Lower Leg: Tubigrip Home Health: Wound #1 Right,Lateral Lower Leg: Continue [facility] Health Visits - [facility] Nurse may visit PRN to address patient s wound care needs. FACE TO FACE ENCOUNTER: MEDICARE and MEDICAID PATIENTS: I certify that this patient is under my care and that I had a face-to-face encounter that meets the physician face-to-face encounter requirements with this patient on this date. The encounter with the patient was in whole or in part for the following MEDICAL CONDITION: (primary reason for [place]) MEDICAL NECESSITY: I certify, that based on my findings, NURSING services are a medically necessary home health service. HOME BOUND STATUS: I certify that my clinical findings support that this patient is homebound (i.e., Due to illness or injury, pt requires aid of supportive devices such as crutches, cane, wheelchairs, walkers, the use of special transportation or the assistance of another person to leave their place of residence. There is a normal inability to leave the home and doing so requires considerable and taxing effort. Other absences are for medical reasons / religious  services and are infrequent or of short duration when for other reasons). If current dressing causes regression in wound condition, may D/C ordered dressing product/s and apply [place]dson, NITASHA GUIA. ([id number]) Normal Saline Moist Dressing daily until next [place] / Other MD appointment. [place] of regression in wound condition at [phone]. Please direct any NON-WOUND related issues/requests for orders to patient's Primary Care Physician Laboratory ordered were: Culture and Sensitivity - right lower leg Silver alginate dressing changes. Edema control with Tubigrip. Once cellulitis is resolved, would recommend compression bandages. Frequent leg elevation. Continue IV antibiotics at dialysis. Follow-up on wound culture obtained today. Electronic Signature(s) Signed: 09/25/2015 4:26:05 PM By: Loletha Grayer MD Entered By: Loletha Grayer on 09/25/2015 13:59:17 Koplin, [name] ([id number]) -------------------------------------------------------------------------------- ROS/PFSH Details Patient Name: [name] [name] Date of Service: 09/25/2015 8:00 AM Medical Record Patient Account Number: [id number] Number: Treating RN: Montey Hora 1951-09-21 (64 y.o. Other Clinician: Date of Birth/Sex: Female) Treating BURNS III, Primary Care Physician/Extender: Roselyn Meier, Waldon Reining Physician: Referring Physician: Denton Brick in Treatment: 0 Label Progress Note Print Version as History and Physical for this encounter Information Obtained From Patient Wound History Do you currently have one or more open woundso Yes How many open wounds do you currently haveo 1 Approximately how long have you had your woundso 2-3 weeks How have you been treating your wound(s) until nowo open to air Has your wound(s) ever healed and then re-openedo No Have you had any lab work done in the past  montho Yes Who ordered the lab work doneo  dialysis Have you tested positive for an antibiotic resistant organism (MRSA, VRE)o No Have you tested positive for osteomyelitis (bone infection)o No Have you had any tests for circulation on your legso No Have you had other problems associated with your woundso Swelling Cardiovascular Complaints and Symptoms: Positive for: LE edema Medical History: Positive for: Coronary Artery Disease; Hypertension Genitourinary Complaints and Symptoms: Positive for: Kidney failure/ Dialysis - ESRD Integumentary (Skin) Complaints and Symptoms: Positive for: Wounds - same type about 6 months Constitutional Symptoms (General Health) Complaints and Symptoms: No Complaints or Symptoms Mago, Rileigh W. ([id number]) Eyes Complaints and Symptoms: No Complaints or Symptoms Ear/Nose/Mouth/Throat Medical History: Past Medical History Notes: HOH - hearing aids Hematologic/Lymphatic Complaints and Symptoms: No Complaints or Symptoms Medical History: Positive for: Anemia Respiratory Complaints and Symptoms: No Complaints or Symptoms Gastrointestinal Complaints and Symptoms: No Complaints or Symptoms Endocrine Medical History: Positive for: Type II Diabetes Treated with: Diet Blood sugar tested every day: Yes Tested : qd Immunological Complaints and Symptoms: No Complaints or Symptoms Musculoskeletal Complaints and Symptoms: No Complaints or Symptoms Neurologic Medical History: Positive for: Neuropathy Sigl, [name] ([id number]) Oncologic Complaints and Symptoms: No Complaints or Symptoms Medical History: Negative for: Received Chemotherapy; Received Radiation Psychiatric Complaints and Symptoms: No Complaints or Symptoms Hospitalization / Surgery History Name of Hospital Purpose of Hospitalization/Surgery Date [facility] fall 07/02/2015 Family and Social History Never smoker; Marital Status - Widowed; Alcohol Use: Never; Drug Use: No History; Caffeine Use: Never; Financial  Concerns: No; Food, Clothing or Shelter Needs: No; Support System Lacking: No; Transportation Concerns: No; Advanced Directives: No; Patient does not want information on Advanced Directives; Medical Power of Attorney: Yes - Dorise Bullion (Not Provided) Physician Affirmation I have reviewed and agree with the above information. Electronic Signature(s) Signed: 09/25/2015 4:26:05 PM By: Loletha Grayer MD Signed: 09/26/2015 5:07:12 PM By: Montey Hora Entered By: Loletha Grayer on 09/25/2015 13:59:00 Dayley, [name] ([id number]) -------------------------------------------------------------------------------- SuperBill Details Patient Name: [name] [name] Date of Service: 09/25/2015 Medical Record Patient Account Number: [id number] Number: Treating RN: Montey Hora [birth year] (64 y.o. Other Clinician: Date of Birth/Sex: Female) Treating BURNS III, Primary Care Physician/Extender: Georgina Snell Physician: Weeks in Treatment: 0 Referring Physician: Mauricia Area Diagnosis Coding ICD-10 Codes Code Description [phone] Varicose veins of right lower extremity with both ulcer of calf and inflammation L03.115 Cellulitis of right lower limb E11.622 Type 2 diabetes mellitus with other skin ulcer N18.6 End stage renal disease I25.10 Atherosclerotic heart disease of native coronary artery without angina pectoris Facility Procedures CPT4 Code: [id number] Description: 99214 - WOUND CARE VISIT-LEV 4 EST PT Modifier: Quantity: 1 Physician Procedures CPT4: Description Modifier Quantity Code [id number] - WC PHYS LEVEL 4 - NEW PT 1 ICD-10 Description Diagnosis I83.212 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

## 2015-09-28 LAB — WOUND CULTURE: CULTURE: NO GROWTH

## 2015-10-02 ENCOUNTER — Encounter: Payer: Medicare Other | Admitting: Surgery

## 2015-10-02 DIAGNOSIS — E11622 Type 2 diabetes mellitus with other skin ulcer: Secondary | ICD-10-CM | POA: Diagnosis not present

## 2015-10-03 NOTE — Progress Notes (Signed)
[name], [name] ([id number]) Visit Report for 10/02/2015 Arrival Information Details Patient Name: [name] [name], [name] [name] 10/02/2015 10:00 Date of Service: AM Medical Record [id number] Number: Patient Account Number: [id number] 04-17-1951 (64 y.o. Treating RN: Montey Hora Date of Birth/Sex: Female) Other Clinician: Primary Care Physician: Dennie Fetters Treating BURNS III, WALTER Referring Physician: Dennie Fetters Physician/Extender: Weeks in Treatment: 1 Visit Information History Since Last Visit Added or deleted any medications: No Patient Arrived: Walker Any new allergies or adverse reactions: No Arrival Time: 10:10 Had a fall or experienced change in No Accompanied By: self activities of daily living that may affect Transfer Assistance: None risk of falls: Patient Identification Verified: Yes Signs or symptoms of abuse/neglect since last No Secondary Verification Process Yes visito Completed: Hospitalized since last visit: No Patient Has Alerts: Yes Pain Present Now: No Patient Alerts: DMII aspirin 81 Electronic Signature(s) Signed: 10/02/2015 5:05:32 PM By: Montey Hora Entered By: Montey Hora on 10/02/2015 10:11:57 Brueggemann, [name] ([id number]) -------------------------------------------------------------------------------- Encounter Discharge Information Details Patient Name: [name] [name]. 10/02/2015 10:00 Date of Service: AM Medical Record [id number] Number: Patient Account Number: [id number] Jan 14, 1951 (64 y.o. Treating RN: Montey Hora Date of Birth/Sex: Female) Other Clinician: Primary Care Physician: Dennie Fetters Treating BURNS III, WALTER Referring Physician: Dennie Fetters Physician/Extender: Weeks in Treatment: 1 Encounter Discharge Information Items Discharge Pain Level: 0 Discharge Condition: Stable Ambulatory Status: Walker Discharge Destination: Home Transportation: Private Auto Accompanied By:  self Schedule Follow-up Appointment: Yes Medication Reconciliation completed and provided to Patient/Care No Starlee Corralejo: Provided on Clinical Summary of Care: 10/02/2015 Form Type Recipient Paper Patient SL Electronic Signature(s) Signed: 10/02/2015 10:50:30 AM By: Ruthine Dose Entered By: Ruthine Dose on 10/02/2015 10:50:30 Moritz, [name] ([id number]) -------------------------------------------------------------------------------- Lower Extremity Assessment Details Patient Name: [name] [name]. 10/02/2015 10:00 Date of Service: AM Medical Record [id number] Number: Patient Account Number: [id number] [birth year] (64 y.o. Treating RN: Montey Hora Date of Birth/Sex: Female) Other Clinician: Primary Care Physician: Dennie Fetters Treating BURNS III, WALTER Referring Physician: Dennie Fetters Physician/Extender: Weeks in Treatment: 1 Edema Assessment Assessed: [Left: No] [Right: No] Edema: [Left: Ye] [Right: s] Calf Left: Right: Point of Measurement: 32 cm From Medial Instep cm 37.8 cm Ankle Left: Right: Point of Measurement: 11 cm From Medial Instep cm 22 cm Vascular Assessment Pulses: Posterior Tibial Dorsalis Pedis Palpable: [Right:Yes] Extremity colors, hair growth, and conditions: Extremity Color: [Right:Hyperpigmented] Hair Growth on Extremity: [Right:No] Temperature of Extremity: [Right:Warm] Capillary Refill: [Right:< 3 seconds] Toe Nail Assessment Left: Right: Thick: No Discolored: No Deformed: No Improper Length and Hygiene: No Electronic Signature(s) Signed: 10/02/2015 5:05:32 PM By: Montey Hora Entered By: Montey Hora on 10/02/2015 10:21:55 Ruffino, [name] ([id number]) [facility], [name] ([id number]) -------------------------------------------------------------------------------- Multi Wound Chart Details Patient Name: [name] [name]. 10/02/2015 10:00 Date of Service: AM Medical Record [id number] Number: Patient Account  Number: [id number] 1950/12/09 (64 y.o. Treating RN: Montey Hora Date of Birth/Sex: Female) Other Clinician: Primary Care Physician: Dennie Fetters Treating BURNS III, WALTER Referring Physician: Dennie Fetters Physician/Extender: Weeks in Treatment: 1 Vital Signs Height(in): 64 Pulse(bpm): 59 Weight(lbs): 188 Blood Pressure 115/58 (mmHg): Body Mass Index(BMI): 32 Temperature(F): 97.8 Respiratory Rate 18 (breaths/min): Photos: [1:No Photos] [N/A:N/A] Wound Location: [1:Right Lower Leg - Lateral] [N/A:N/A] Wounding Event: [1:Blister] [N/A:N/A] Primary Etiology: [1:Venous Leg Ulcer] [N/A:N/A] Comorbid History: [1:Anemia, Coronary Artery Disease, Hypertension, Type II Diabetes, Neuropathy] [N/A:N/A] Date Acquired: [1:09/04/2015] [N/A:N/A] Weeks of Treatment: [1:1] [N/A:N/A] Wound Status: [1:Open] [N/A:N/A] Clustered Wound: [1:Yes] [N/A:N/A] Measurements L x W x D 3.5x1.6x0.1 [N/A:N/A] (cm) Area (cm) : [1:4.398] [  N/A:N/A] Volume (cm) : [1:0.44] [N/A:N/A] % Reduction in Area: [1:81.90%] [N/A:N/A] % Reduction in Volume: 81.90% [N/A:N/A] Classification: [1:Partial Thickness] [N/A:N/A] HBO Classification: [1:Grade 1] [N/A:N/A] Exudate Amount: [1:Large] [N/A:N/A] Exudate Type: [1:Serous] [N/A:N/A] Exudate Color: [1:amber] [N/A:N/A] Wound Margin: [1:Flat and Intact] [N/A:N/A] Granulation Amount: [1:Large (67-100%)] [N/A:N/A] Granulation Quality: [1:Pink] [N/A:N/A] Necrotic Amount: [1:Small (1-33%)] [N/A:N/A] Exposed Structures: [N/A:N/A] Fascia: No Fat: No Tendon: No Muscle: No Joint: No Bone: No Limited to Skin Breakdown Epithelialization: None N/A N/A Periwound Skin Texture: Edema: No N/A N/A Excoriation: No Induration: No Callus: No Crepitus: No Fluctuance: No Friable: No Rash: No Scarring: No Periwound Skin Maceration: No N/A N/A Moisture: Moist: No Dry/Scaly: No Periwound Skin Color: Erythema: Yes N/A N/A Atrophie Blanche: No Cyanosis:  No Ecchymosis: No Hemosiderin Staining: No Mottled: No Pallor: No Rubor: No Erythema Location: Circumferential N/A N/A Tenderness on Yes N/A N/A Palpation: Wound Preparation: Ulcer Cleansing: N/A N/A Rinsed/Irrigated with Saline Topical Anesthetic Applied: Other: lidocaine 4% Treatment Notes Electronic Signature(s) Signed: 10/02/2015 5:05:32 PM By: Montey Hora Entered By: Montey Hora on 10/02/2015 10:22:10 Raymond, [name] ([id number]) -------------------------------------------------------------------------------- [place] Details Patient Name: ADVIKA, FOXX. 10/02/2015 10:00 Date of Service: AM Medical Record [id number] Number: Patient Account Number: [id number] 25-Sep-1951 (64 y.o. Treating RN: Montey Hora Date of Birth/Sex: Female) Other Clinician: Primary Care Physician: Dennie Fetters Treating BURNS III, WALTER Referring Physician: Dennie Fetters Physician/Extender: Weeks in Treatment: 1 Active Inactive Abuse / Safety / Falls / Self Care Management Nursing Diagnoses: Impaired physical mobility Potential for falls Goals: Patient will remain injury free Date Initiated: 09/25/2015 Goal Status: Active Interventions: Assess fall risk on admission and as needed Notes: Orientation to the Wound Care Program Nursing Diagnoses: Knowledge deficit related to the wound healing center program Goals: Patient/caregiver will verbalize understanding of the [facility] Date Initiated: 09/25/2015 Goal Status: Active Interventions: Provide education on orientation to the wound center Notes: Venous Leg Ulcer Nursing Diagnoses: Potential for venous Insuffiency (use before diagnosis confirmed) Ishee, [name] ([id number]) Goals: Patient will maintain optimal edema control Date Initiated: 09/25/2015 Goal Status: Active Interventions: Assess peripheral edema status every visit. Notes: Wound/Skin Impairment Nursing  Diagnoses: Impaired tissue integrity Goals: Patient/caregiver will verbalize understanding of skin care regimen Date Initiated: 09/25/2015 Goal Status: Active Ulcer/skin breakdown will have a volume reduction of 30% by week 4 Date Initiated: 09/25/2015 Goal Status: Active Ulcer/skin breakdown will have a volume reduction of 50% by week 8 Date Initiated: 09/25/2015 Goal Status: Active Ulcer/skin breakdown will have a volume reduction of 80% by week 12 Date Initiated: 09/25/2015 Goal Status: Active Ulcer/skin breakdown will heal within 14 weeks Date Initiated: 09/25/2015 Goal Status: Active Interventions: Assess patient/caregiver ability to perform ulcer/skin care regimen upon admission and as needed Assess ulceration(s) every visit Notes: Electronic Signature(s) Signed: 10/02/2015 5:05:32 PM By: Montey Hora Entered By: Montey Hora on 10/02/2015 10:22:02 Epp, [name] ([id number]) -------------------------------------------------------------------------------- Patient/Caregiver Education Details Patient Name: [name] [name]. 10/02/2015 10:00 Date of Service: AM Medical Record [id number] Number: Patient Account Number: [id number] 1951-01-11 (64 y.o. Treating RN: Montey Hora Date of Birth/Gender: Female) Other Clinician: Primary Care Physician: Dennie Fetters Treating BURNS III, WALTER Referring Physician: Dennie Fetters Physician/Extender: Weeks in Treatment: 1 Education Assessment Education Provided To: Patient Education Topics Provided Wound/Skin Impairment: Handouts: Other: wound care as ordered Methods: Demonstration, Explain/Verbal Responses: State content correctly Electronic Signature(s) Signed: 10/02/2015 5:05:32 PM By: Montey Hora Entered By: Montey Hora on 10/02/2015 10:40:31 Robb, [name] ([id number]) -------------------------------------------------------------------------------- Wound Assessment Details Patient Name: Theodore Demark,  Kirstein W. 10/02/2015 10:00 Date of Service: AM Medical Record [id number] Number: Patient Account Number: [id number] 17-Dec-1950 (64 y.o. Treating RN: Montey Hora Date of Birth/Sex: Female) Other Clinician: Primary Care Physician: Dennie Fetters Treating BURNS III, WALTER Referring Physician: Dennie Fetters Physician/Extender: Weeks in Treatment: 1 Wound Status Wound Number: 1 Primary Venous Leg Ulcer Etiology: Wound Location: Right Lower Leg - Lateral Wound Open Wounding Event: Blister Status: Date Acquired: 09/04/2015 Comorbid Anemia, Coronary Artery Disease, Weeks Of Treatment: 1 History: Hypertension, Type II Diabetes, Clustered Wound: Yes Neuropathy Photos Photo Uploaded By: Montey Hora on 10/02/2015 11:37:34 Wound Measurements Length: (cm) 3.5 Width: (cm) 1.6 Depth: (cm) 0.1 Area: (cm) 4.398 Volume: (cm) 0.44 % Reduction in Area: 81.9% % Reduction in Volume: 81.9% Epithelialization: None Tunneling: No Undermining: No Wound Description Classification: Partial Thickness Foul Odor Af Diabetic Severity Earleen Newport): Grade 1 Wound Margin: Flat and Intact Exudate Amount: Small Exudate Type: Serous Exudate Color: amber ter Cleansing: No Wound Bed Granulation Amount: Large (67-100%) Exposed Structure Mancera, Malone. ([id number]) Granulation Quality: Pink Fascia Exposed: No Necrotic Amount: Small (1-33%) Fat Layer Exposed: No Necrotic Quality: Adherent Slough Tendon Exposed: No Muscle Exposed: No Joint Exposed: No Bone Exposed: No Limited to Skin Breakdown Periwound Skin Texture Texture Color No Abnormalities Noted: No No Abnormalities Noted: No Callus: No Atrophie Blanche: No Crepitus: No Cyanosis: No Excoriation: No Ecchymosis: No Fluctuance: No Erythema: Yes Friable: No Erythema Location: Circumferential Induration: No Hemosiderin Staining: No Localized Edema: No Mottled: No Rash: No Pallor: No Scarring: No Rubor:  No Moisture Temperature / Pain No Abnormalities Noted: No Tenderness on Palpation: Yes Dry / Scaly: No Maceration: No Moist: No Wound Preparation Ulcer Cleansing: Rinsed/Irrigated with Saline Topical Anesthetic Applied: Other: lidocaine 4%, Treatment Notes Wound #1 (Right, Lateral Lower Leg) 1. Cleansed with: Clean wound with Normal Saline 2. Anesthetic Topical Lidocaine 4% cream to wound bed prior to debridement 4. Dressing Applied: Prisma Ag 5. Secondary Dressing Applied ABD and Kerlix/Conform 7. Secured with Tape Notes stretch netting Electronic Signature(s) VIRGILENE, LOURA ([id number]) Signed: 10/02/2015 5:05:32 PM By: Montey Hora Entered By: Montey Hora on 10/02/2015 10:38:18 Encarnacion, [name] ([id number]) -------------------------------------------------------------------------------- Vitals Details Patient Name: [name] [name]. 10/02/2015 10:00 Date of Service: AM Medical Record [id number] Number: Patient Account Number: [id number] 12-Oct-1951 (64 y.o. Treating RN: Montey Hora Date of Birth/Sex: Female) Other Clinician: Primary Care Physician: Dennie Fetters Treating BURNS III, WALTER Referring Physician: Dennie Fetters Physician/Extender: Weeks in Treatment: 1 Vital Signs Time Taken: 10:12 Temperature (F): 97.8 Height (in): 64 Pulse (bpm): 59 Weight (lbs): 188 Respiratory Rate (breaths/min): 18 Body Mass Index (BMI): 32.3 Blood Pressure (mmHg): 115/58 Reference Range: 80 - 120 mg / dl Electronic Signature(s) Signed: 10/02/2015 5:05:32 PM By: Montey Hora Entered By: Montey Hora on 10/02/2015 10:12:55

## 2015-10-03 NOTE — Progress Notes (Signed)
ICY, CHAMPIGNY ([id number]) Visit Report for 10/02/2015 Chief Complaint Document Details Patient Name: [name] [name], [name] [name] 10/02/2015 10:00 Date of Service: AM Medical Record [id number] Number: Patient Account Number: [id number] December 19, 1950 (64 y.o. Treating RN: Montey Hora Date of Birth/Sex: Female) Other Clinician: Primary Care Treating [place], [place] Physician: Physician/Extender: Referring Physician: Lowry Bowl in Treatment: 1 Information Obtained from: Patient Chief Complaint Right calf ulcer. Electronic Signature(s) Signed: 10/02/2015 4:30:30 PM By: Loletha Grayer MD Entered By: Loletha Grayer on 10/02/2015 10:50:43 Mynhier, [name] ([id number]) -------------------------------------------------------------------------------- Debridement Details Patient Name: [name] [name]. 10/02/2015 10:00 Date of Service: AM Medical Record [id number] Number: Patient Account Number: [id number] 1951/03/17 (64 y.o. Treating RN: Montey Hora Date of Birth/Sex: Female) Other Clinician: Primary Care Treating Dennie Fetters BURNS III, Erika Slaby Physician: Physician/Extender: Referring Physician: Lowry Bowl in Treatment: 1 Debridement Performed for Wound #1 Right,Lateral Lower Leg Assessment: Performed By: Physician BURNS III, Teressa Senter., MD Debridement: Debridement Pre-procedure Yes Verification/Time Out Taken: Start Time: 10:35 Pain Control: Lidocaine 4% Topical Solution Level: Skin/Subcutaneous Tissue Total Area Debrided (L x 3.5 (cm) x 1.6 (cm) = 5.6 (cm) W): Tissue and other Viable, Non-Viable, Eschar, Fat, Fibrin/Slough, Subcutaneous material debrided: Instrument: Curette Bleeding: Minimum Hemostasis Achieved: Pressure End Time: 10:38 Procedural Pain: 0 Post Procedural Pain: 0 Response to Treatment: Procedure was tolerated well Post Debridement Measurements of Total Wound Length: (cm) 3.5 Width: (cm)  1.6 Depth: (cm) 0.2 Volume: (cm) 0.88 Post Procedure Diagnosis Same as Pre-procedure Electronic Signature(s) Signed: 10/02/2015 4:30:30 PM By: Loletha Grayer MD Signed: 10/02/2015 5:05:32 PM By: Montey Hora Entered By: Loletha Grayer on 10/02/2015 10:50:22 Schuur, [name] ([id number]) Haefele, [name] ([id number]) -------------------------------------------------------------------------------- HPI Details Patient Name: [name] [name]. 10/02/2015 10:00 Date of Service: AM Medical Record [id number] Number: Patient Account Number: [id number] [birth year] (64 y.o. Treating RN: Montey Hora Date of Birth/Sex: Female) Other Clinician: Primary Care Treating [place], [place] Physician: Physician/Extender: Referring Physician: Lowry Bowl in Treatment: 1 History of Present Illness HPI Description: Pleasant 64 year old with history of type 2 diabetes (hemoglobin A1c 5.1 in Oct 2016), end-stage renal disease (on hemodialysis), and coronary artery disease. No history of PAD. Recently developed recurrent right calf ulcerations and cellulitis. Receiving vancomycin and ceftazidime at dialysis with mild improvement. Culture grew staph aureus. No sensitivities available. She does not currently wear any compression. She says that she has undergone treatment with compression bandages in the past for prior ulcerations. She performs minimal ambulation with a walker. No claudication or rest pain. ABI not performed on the right secondary to painful ulcerations. ABI on the left was 1.15. Palpable pedal pulses. Performing dressing changes with Promogran plasma. Tubigrip for edema control. Repeat wound culture 09/25/2015 showed gram-negative rods on Gram stain but no growth. She returns to clinic for follow-up and is without new complaints. No significant pain. No fever or chills. Minimal drainage. Electronic Signature(s) Signed: 10/02/2015 4:30:30 PM  By: Loletha Grayer MD Entered By: Loletha Grayer on 10/02/2015 10:55:24 [place], [name] ([id number]) -------------------------------------------------------------------------------- Physical Exam Details Patient Name: [name] [name] 10/02/2015 10:00 Date of Service: AM Medical Record [id number] Number: Patient Account Number: [id number] August 09, 1951 (64 y.o. Treating RN: Montey Hora Date of Birth/Sex: Female) Other Clinician: Primary Care Treating [place], [place] Physician: Physician/Extender: Referring Physician: Lowry Bowl in Treatment: 1 Constitutional . Pulse regular. Respirations normal and unlabored. Afebrile. . Notes Right calf ulceration. Full-thickness. Debrided to healthy underlying granulation tissue. No significant cellulitis.  1+ pitting edema. Palpable DP. ABI not obtained on right. Left ABI 1.15. Electronic Signature(s) Signed: 10/02/2015 4:30:30 PM By: Loletha Grayer MD Entered By: Loletha Grayer on 10/02/2015 10:55:36 Behe, [name] ([id number]) -------------------------------------------------------------------------------- Physician Orders Details Patient Name: [name] [name]. 10/02/2015 10:00 Date of Service: AM Medical Record [id number] Number: Patient Account Number: [id number] [birth year] (64 y.o. Treating RN: Montey Hora Date of Birth/Sex: Female) Other Clinician: Primary Care Treating Alverda Skeans, Ercilia Bettinger Physician: Physician/Extender: Referring Physician: Lowry Bowl in Treatment: 1 Verbal / Phone Orders: Yes Clinician: Dorthy, Joanna Read Back and Verified: Yes Diagnosis Coding Wound Cleansing Wound #1 Right,Lateral Lower Leg o Clean wound with Normal Saline. Anesthetic Wound #1 Right,Lateral Lower Leg o Topical Lidocaine 4% cream applied to wound bed prior to debridement Primary Wound Dressing Wound #1 Right,Lateral Lower Leg o Prisma Ag -  or collagen with silver equivalent Secondary Dressing Wound #1 Right,Lateral Lower Leg o ABD and Kerlix/Conform - stretch netting Dressing Change Frequency Wound #1 Right,Lateral Lower Leg o Change dressing every other day. Follow-up Appointments Wound #1 Right,Lateral Lower Leg o Return Appointment in 2 weeks. Edema Control Wound #1 Right,Lateral Lower Leg o Tubigrip Home Health Wound #1 Right,Lateral Lower Leg o Continue Home Health Visits - [place] Nurse may visit PRN to address patientos wound care needs. ELIM, HASBROOK ([id number]) o FACE TO FACE ENCOUNTER: MEDICARE and MEDICAID PATIENTS: I certify that this patient is under my care and that I had a face-to-face encounter that meets the physician face-to-face encounter requirements with this patient on this date. The encounter with the patient was in whole or in part for the following MEDICAL CONDITION: (primary reason for [place]) MEDICAL NECESSITY: I certify, that based on my findings, NURSING services are a medically necessary home health service. HOME BOUND STATUS: I certify that my clinical findings support that this patient is homebound (i.e., Due to illness or injury, pt requires aid of supportive devices such as crutches, cane, wheelchairs, walkers, the use of special transportation or the assistance of another person to leave their place of residence. There is a normal inability to leave the home and doing so requires considerable and taxing effort. Other absences are for medical reasons / religious services and are infrequent or of short duration when for other reasons). o If current dressing causes regression in wound condition, may D/C ordered dressing product/s and apply Normal Saline Moist Dressing daily until next [place] / Other MD appointment. [place] of regression in wound condition at [phone]. o Please direct any NON-WOUND related  issues/requests for orders to patient's Primary Care Physician Electronic Signature(s) Signed: 10/02/2015 4:30:30 PM By: Loletha Grayer MD Signed: 10/02/2015 5:05:32 PM By: Montey Hora Entered By: Montey Hora on 10/02/2015 10:39:21 [place], [name] ([id number]) -------------------------------------------------------------------------------- Problem List Details Patient Name: JESICA, STRAIN. 10/02/2015 10:00 Date of Service: AM Medical Record [id number] Number: Patient Account Number: [id number] Aug 07, 1951 (64 y.o. Treating RN: Montey Hora Date of Birth/Sex: Female) Other Clinician: Primary Care Treating [place], [place] Physician: Physician/Extender: Referring Physician: Lowry Bowl in Treatment: 1 Active Problems ICD-10 Encounter Code Description Active Date Diagnosis I83.212 Varicose veins of right lower extremity with both ulcer of 09/25/2015 Yes calf and inflammation E11.622 Type 2 diabetes mellitus with other skin ulcer 09/25/2015 Yes N18.6 End stage renal disease 09/25/2015 Yes I25.10 Atherosclerotic heart disease of native coronary artery 09/25/2015 Yes without angina pectoris Inactive Problems Resolved Problems ICD-10 Code  Description Active Date Resolved Date L03.115 Cellulitis of right lower limb 09/25/2015 09/25/2015 Electronic Signature(s) Signed: 10/02/2015 4:30:30 PM By: Loletha Grayer MD Entered By: Loletha Grayer on 10/02/2015 10:49:59 Mussa, [name] ([id number]) -------------------------------------------------------------------------------- Progress Note Details Patient Name: [name] [name]. 10/02/2015 10:00 Date of Service: AM Medical Record [id number] Number: Patient Account Number: [id number] 1951-07-10 (64 y.o. Treating RN: Montey Hora Date of Birth/Sex: Female) Other Clinician: Primary Care Treating [place], [place] Physician: Physician/Extender: Referring  Physician: Lowry Bowl in Treatment: 1 Subjective Chief Complaint Information obtained from Patient Right calf ulcer. History of Present Illness (HPI) Pleasant 64 year old with history of type 2 diabetes (hemoglobin A1c 5.1 in Oct 2016), end-stage renal disease (on hemodialysis), and coronary artery disease. No history of PAD. Recently developed recurrent right calf ulcerations and cellulitis. Receiving vancomycin and ceftazidime at dialysis with mild improvement. Culture grew staph aureus. No sensitivities available. She does not currently wear any compression. She says that she has undergone treatment with compression bandages in the past for prior ulcerations. She performs minimal ambulation with a walker. No claudication or rest pain. ABI not performed on the right secondary to painful ulcerations. ABI on the left was 1.15. Palpable pedal pulses. Performing dressing changes with Promogran plasma. Tubigrip for edema control. Repeat wound culture 09/25/2015 showed gram-negative rods on Gram stain but no growth. She returns to clinic for follow-up and is without new complaints. No significant pain. No fever or chills. Minimal drainage. Objective Constitutional Pulse regular. Respirations normal and unlabored. Afebrile. Vitals Time Taken: 10:12 AM, Height: 64 in, Weight: 188 lbs, BMI: 32.3, Temperature: 97.8 F, Pulse: 59 bpm, Respiratory Rate: 18 breaths/min, Blood Pressure: 115/58 mmHg. General Notes: Right calf ulceration. Full-thickness. Debrided to healthy underlying granulation tissue. No significant cellulitis. 1+ pitting edema. Palpable DP. ABI not obtained on right. Left ABI 1.15. TEYLA, HELBIG ([id number]) Integumentary (Hair, Skin) Wound #1 status is Open. Original cause of wound was Blister. The wound is located on the Right,Lateral Lower Leg. The wound measures 3.5cm length x 1.6cm width x 0.1cm depth; 4.398cm^2 area and 0.44cm^3 volume. The wound is  limited to skin breakdown. There is no tunneling or undermining noted. There is a small amount of serous drainage noted. The wound margin is flat and intact. There is large (67- 100%) pink granulation within the wound bed. There is a small (1-33%) amount of necrotic tissue within the wound bed including Adherent Slough. The periwound skin appearance exhibited: Erythema. The periwound skin appearance did not exhibit: Callus, Crepitus, Excoriation, Fluctuance, Friable, Induration, Localized Edema, Rash, Scarring, Dry/Scaly, Maceration, Moist, Atrophie Blanche, Cyanosis, Ecchymosis, Hemosiderin Staining, Mottled, Pallor, Rubor. The surrounding wound skin color is noted with erythema which is circumferential. The periwound has tenderness on palpation. Assessment Active Problems ICD-10 I83.212 - Varicose veins of right lower extremity with both ulcer of calf and inflammation E11.622 - Type 2 diabetes mellitus with other skin ulcer N18.6 - End stage renal disease I25.10 - Atherosclerotic heart disease of native coronary artery without angina pectoris Right calf ulceration. Resolving cellulitis. Procedures Wound #1 Wound #1 is a Venous Leg Ulcer located on the Right,Lateral Lower Leg . There was a Skin/Subcutaneous Tissue Debridement [id number]) debridement with total area of 5.6 sq cm performed by BURNS III, Teressa Senter., MD. with the following instrument(s): Curette to remove Viable and Non-Viable tissue/material including Fat, Fibrin/Slough, Eschar, and Subcutaneous after achieving pain control using Lidocaine 4% Topical Solution. A time out was conducted prior to the start of the  procedure. A Minimum amount of bleeding was controlled with Pressure. The procedure was tolerated well with a pain level of 0 throughout and a pain level of 0 following the procedure. Post Debridement Measurements: 3.5cm length x 1.6cm width x 0.2cm depth; 0.88cm^3 volume. Post procedure Diagnosis Wound #1: Same as  Pre-Procedure Campillo, Betul W. ([id number]) Plan Wound Cleansing: Wound #1 Right,Lateral Lower Leg: Clean wound with Normal Saline. Anesthetic: Wound #1 Right,Lateral Lower Leg: Topical Lidocaine 4% cream applied to wound bed prior to debridement Primary Wound Dressing: Wound #1 Right,Lateral Lower Leg: Prisma Ag - or collagen with silver equivalent Secondary Dressing: Wound #1 Right,Lateral Lower Leg: ABD and Kerlix/Conform - stretch netting Dressing Change Frequency: Wound #1 Right,Lateral Lower Leg: Change dressing every other day. Follow-up Appointments: Wound #1 Right,Lateral Lower Leg: Return Appointment in 2 weeks. Edema Control: Wound #1 Right,Lateral Lower Leg: Tubigrip Home Health: Wound #1 Right,Lateral Lower Leg: Continue Home Health Visits - [facility] Nurse may visit PRN to address patient s wound care needs. FACE TO FACE ENCOUNTER: MEDICARE and MEDICAID PATIENTS: I certify that this patient is under my care and that I had a face-to-face encounter that meets the physician face-to-face encounter requirements with this patient on this date. The encounter with the patient was in whole or in part for the following MEDICAL CONDITION: (primary reason for [place]) MEDICAL NECESSITY: I certify, that based on my findings, NURSING services are a medically necessary home health service. HOME BOUND STATUS: I certify that my clinical findings support that this patient is homebound (i.e., Due to illness or injury, pt requires aid of supportive devices such as crutches, cane, wheelchairs, walkers, the use of special transportation or the assistance of another person to leave their place of residence. There is a normal inability to leave the home and doing so requires considerable and taxing effort. Other absences are for medical reasons / religious services and are infrequent or of short duration when for other reasons). If current dressing causes regression in  wound condition, may D/C ordered dressing product/s and apply Normal Saline Moist Dressing daily until next [place] / Other MD appointment. [place] of regression in wound condition at [phone]. Please direct any NON-WOUND related issues/requests for orders to patient's Primary Care Physician ADHYA, DLUGOSZ. ([id number]) Promogran Prisma. Tubigrip for edema control. Frequent leg elevation. Electronic Signature(s) Signed: 10/02/2015 4:30:30 PM By: Loletha Grayer MD Entered By: Loletha Grayer on 10/02/2015 10:56:16 [place], [name] ([id number]) -------------------------------------------------------------------------------- SuperBill Details Patient Name: [name] [name] Date of Service: 10/02/2015 Medical Record Patient Account Number: [id number] Number: Treating RN: Montey Hora [birth year] (64 y.o. Other Clinician: Date of Birth/Sex: Female) Treating BURNS III, Primary Care Physician/Extender: Georgina Snell Physician: Weeks in Treatment: 1 Referring Physician: Dennie Fetters Diagnosis Coding ICD-10 Codes Code Description [phone] Varicose veins of right lower extremity with both ulcer of calf and inflammation E11.622 Type 2 diabetes mellitus with other skin ulcer N18.6 End stage renal disease I25.10 Atherosclerotic heart disease of native coronary artery without angina pectoris Facility Procedures CPT4: Description Modifier Quantity Code [id number] - DEB SUBQ TISSUE 20 SQ CM/< 1 ICD-10 Description Diagnosis I83.212 Varicose veins of right lower extremity with both ulcer of calf and inflammation Physician Procedures CPT4: Description Modifier Quantity Code [id number] - WC PHYS SUBQ TISS 20 SQ CM 1 ICD-10 Description Diagnosis I83.212 Varicose 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

## 2015-10-09 ENCOUNTER — Encounter: Payer: Medicare Other | Admitting: Surgery

## 2015-10-09 DIAGNOSIS — E11622 Type 2 diabetes mellitus with other skin ulcer: Secondary | ICD-10-CM | POA: Diagnosis not present

## 2015-10-10 NOTE — Progress Notes (Signed)
GITA, DESHOTELS ([id number]) Visit Report for 10/09/2015 Chief Complaint Document Details Patient Name: [name] [name], [name] [name] 10/09/2015 11:15 Date of Service: AM Medical Record [id number] Number: Patient Account Number: [id number] 11-29-1950 (64 y.o. Treating RN: Cornell Barman Date of Birth/Sex: Female) Other Clinician: Primary Care Treating [place], [place] Physician: Physician/Extender: Referring Physician: Lowry Bowl in Treatment: 2 Information Obtained from: Patient Chief Complaint Right calf ulcers (healed). Electronic Signature(s) Signed: 10/09/2015 3:24:44 PM By: Loletha Grayer MD Entered By: Loletha Grayer on 10/09/2015 11:44:27 Novell, [name] [name] ([id number]) -------------------------------------------------------------------------------- HPI Details Patient Name: [name] [name], [name] [name] 10/09/2015 11:15 Date of Service: AM Medical Record [id number] Number: Patient Account Number: [id number] [birth year] (64 y.o. Treating RN: Cornell Barman Date of Birth/Sex: Female) Other Clinician: Primary Care Treating [place], [place] Physician: Physician/Extender: Referring Physician: Lowry Bowl in Treatment: 2 History of Present Illness HPI Description: Pleasant 64 year old with history of type 2 diabetes (hemoglobin A1c 5.1 in Oct 2016), end-stage renal disease (on hemodialysis), and coronary artery disease. No history of PAD. Recently developed recurrent right calf ulcerations and cellulitis. Received vancomycin and ceftazidime at dialysis. Culture grew staph aureus. No sensitivities available. She performs minimal ambulation with a walker. No claudication or rest pain. ABI not performed on the right secondary to painful ulcerations. ABI on the left was 1.15. Palpable pedal pulses. Performing dressing changes with Promogran plasma. Tubigrip for edema control on the right. Using a juxtalite compression garment on  the left. Has ordered a another juxtalite compression garment. She returns to clinic for follow-up and is without new complaints. No significant pain. No fever or chills. No drainage. Electronic Signature(s) Signed: 10/09/2015 3:24:44 PM By: Loletha Grayer MD Entered By: Loletha Grayer on 10/09/2015 11:45:47 Simeone, [name] [name] ([id number]) -------------------------------------------------------------------------------- Physical Exam Details Patient Name: [name] [name], [name] [name] 10/09/2015 11:15 Date of Service: AM Medical Record [id number] Number: Patient Account Number: [id number] Oct 21, 1951 (64 y.o. Treating RN: Cornell Barman Date of Birth/Sex: Female) Other Clinician: Primary Care Treating [place], [place] Physician: Physician/Extender: Referring Physician: Lowry Bowl in Treatment: 2 Constitutional . Pulse regular. Respirations normal and unlabored. Afebrile. . Notes Multiple scabs covering previous ulcerations on right calf. Removed. All ulcerations are re-epithelialized. No drainage. No infection. 1+ pitting edema. Palpable DP. Electronic Signature(s) Signed: 10/09/2015 3:24:44 PM By: Loletha Grayer MD Entered By: Loletha Grayer on 10/09/2015 11:46:28 Coppola, [name] [name] ([id number]) -------------------------------------------------------------------------------- Physician Orders Details Patient Name: [name] [name], [name] [name]. 10/09/2015 11:15 Date of Service: AM Medical Record [id number] Number: Patient Account Number: [id number] 05-01-1951 (64 y.o. Treating RN: Cornell Barman Date of Birth/Sex: Female) Other Clinician: Primary Care Treating Alverda Skeans, Marcelene Weidemann Physician: Physician/Extender: Referring Physician: Lowry Bowl in Treatment: 2 Verbal / Phone Orders: Yes Clinician: Cornell Barman Read Back and Verified: Yes Diagnosis Coding Discharge From [facility] o Discharge from [place] -  treatment complete Electronic Signature(s) Signed: 10/09/2015 3:24:44 PM By: Loletha Grayer MD Signed: 10/09/2015 3:40:20 PM By: Gretta Cool RN, BSN, Kim RN, BSN Entered By: Gretta Cool, RN, BSN, Kim on 10/09/2015 11:43:12 Vetsch, [name] [name] ([id number]) -------------------------------------------------------------------------------- Problem List Details Patient Name: [name] [name], [name] [name] 10/09/2015 11:15 Date of Service: AM Medical Record [id number] Number: Patient Account Number: [id number] [birth year] (64 y.o. Treating RN: Cornell Barman Date of Birth/Sex: Female) Other Clinician: Primary Care Treating [place], [place] Physician: Physician/Extender: Referring Physician: Lowry Bowl in Treatment: 2 Active Problems ICD-10 Encounter Code Description Active Date Diagnosis I83.212 Varicose veins of  right lower extremity with both ulcer of 09/25/2015 Yes calf and inflammation E11.622 Type 2 diabetes mellitus with other skin ulcer 09/25/2015 Yes N18.6 End stage renal disease 09/25/2015 Yes I25.10 Atherosclerotic heart disease of native coronary artery 09/25/2015 Yes without angina pectoris Inactive Problems Resolved Problems ICD-10 Code Description Active Date Resolved Date L03.115 Cellulitis of right lower limb 09/25/2015 09/25/2015 Electronic Signature(s) Signed: 10/09/2015 3:24:44 PM By: Loletha Grayer MD Entered By: Loletha Grayer on 10/09/2015 11:44:10 Pedro, [name] [name] ([id number]) -------------------------------------------------------------------------------- Progress Note Details Patient Name: [name] [name]. 10/09/2015 11:15 Date of Service: AM Medical Record [id number] Number: Patient Account Number: [id number] 1951-01-18 (64 y.o. Treating RN: Cornell Barman Date of Birth/Sex: Female) Other Clinician: Primary Care Treating [place], [place] Physician: Physician/Extender: Referring Physician: Lowry Bowl in  Treatment: 2 Subjective Chief Complaint Information obtained from Patient Right calf ulcers (healed). History of Present Illness (HPI) Pleasant 64 year old with history of type 2 diabetes (hemoglobin A1c 5.1 in Oct 2016), end-stage renal disease (on hemodialysis), and coronary artery disease. No history of PAD. Recently developed recurrent right calf ulcerations and cellulitis. Received vancomycin and ceftazidime at dialysis. Culture grew staph aureus. No sensitivities available. She performs minimal ambulation with a walker. No claudication or rest pain. ABI not performed on the right secondary to painful ulcerations. ABI on the left was 1.15. Palpable pedal pulses. Performing dressing changes with Promogran plasma. Tubigrip for edema control on the right. Using a juxtalite compression garment on the left. Has ordered a another juxtalite compression garment. She returns to clinic for follow-up and is without new complaints. No significant pain. No fever or chills. No drainage. Objective Constitutional Pulse regular. Respirations normal and unlabored. Afebrile. Vitals Time Taken: 11:29 AM, Height: 64 in, Weight: 188 lbs, BMI: 32.3, Temperature: 97.4 F, Respiratory Rate: 18 breaths/min, Blood Pressure: 84/64 mmHg. General Notes: Multiple scabs covering previous ulcerations on right calf. Removed. All ulcerations are re- epithelialized. No drainage. No infection. 1+ pitting edema. Palpable DP. Integumentary (Hair, Skin) [name] [name], [name]. ([id number]) Wound #1 status is Healed - Epithelialized. Original cause of wound was Blister. The wound is located on the Right,Lateral Lower Leg. The wound measures 0cm length x 0cm width x 0cm depth; 0cm^2 area and 0cm^3 volume. The wound is limited to skin breakdown. There is no tunneling or undermining noted. There is a small amount of serous drainage noted. The wound margin is flat and intact. There is no granulation within the wound bed. There is a  large (67-100%) amount of necrotic tissue within the wound bed including Eschar. The periwound skin appearance exhibited: Dry/Scaly. The periwound skin appearance did not exhibit: Callus, Crepitus, Excoriation, Fluctuance, Friable, Induration, Localized Edema, Rash, Scarring, Maceration, Moist, Atrophie Blanche, Cyanosis, Ecchymosis, Hemosiderin Staining, Mottled, Pallor, Rubor, Erythema. The periwound has tenderness on palpation. Assessment Active Problems ICD-10 I83.212 - Varicose veins of right lower extremity with both ulcer of calf and inflammation E11.622 - Type 2 diabetes mellitus with other skin ulcer N18.6 - End stage renal disease I25.10 - Atherosclerotic heart disease of native coronary artery without angina pectoris Healed right calf ulcerations. Plan Discharge From [facility] Services: Discharge from [place] complete Wear juxtalite compression garments daily. Return to clinic when necessary. Electronic Signature(s) Signed: 10/09/2015 3:24:44 PM By: Loletha Grayer MD Entered By: Loletha Grayer on 10/09/2015 11:47:11 [name] [name], [name] [name] ([id number]) [name] [name], [name] [name] ([id number]) -------------------------------------------------------------------------------- SuperBill Details Patient Name: [name] [name] Date of Service: 10/09/2015 Medical Record Patient Account Number: [id number]  [id number] Number: Treating RN: Cornell Barman [birth year] (64 y.o. Other Clinician: Date of Birth/Sex: Female) Treating BURNS III, Primary Care Physician/Extender: Georgina Snell Physician: Weeks in Treatment: 2 Referring Physician: Dennie Fetters Diagnosis Coding ICD-10 Codes Code Description [phone] Varicose veins of right lower extremity with both ulcer of calf and inflammation E11.622 Type 2 diabetes mellitus with other skin ulcer N18.6 End stage renal disease I25.10 Atherosclerotic heart disease of native coronary artery without angina  pectoris Facility Procedures CPT4 Code: [id number] Description: [id number] - WOUND CARE VISIT-LEV 2 EST PT Modifier: Quantity: 1 Physician Procedures CPT4: Description Modifier Quantity Code [id number] - WC PHYS LEVEL 2 - EST PT 1 ICD-10 Description Diagnosis I83.212 Varicose veins of right lower extremity with both ulcer of calf and inflammation E11.622 Type 2 diabetes mellitus with other skin  ulcer Electronic Signature(s) Signed: 10/09/2015 3:24:44 PM By: Loletha Grayer MD Entered By: Loletha Grayer on 10/09/2015 11:47:35

## 2015-10-10 NOTE — Progress Notes (Addendum)
[name], [name] ([id number]) Visit Report for 10/09/2015 Arrival Information Details Patient Name: [name] [name], [name] [name] 10/09/2015 11:15 Date of Service: AM Medical Record [id number] Number: Patient Account Number: [id number] 03/27/1951 (64 y.o. Treating RN: Cornell Barman Date of Birth/Sex: Female) Other Clinician: Primary Care Physician: Dennie Fetters Treating BURNS III, WALTER Referring Physician: Dennie Fetters Physician/Extender: Weeks in Treatment: 2 Visit Information History Since Last Visit Added or deleted any medications: Yes Patient Arrived: Walker Any new allergies or adverse reactions: No Arrival Time: 11:28 Had a fall or experienced change in No Accompanied By: self activities of daily living that may affect Transfer Assistance: None risk of falls: Patient Identification Verified: Yes Signs or symptoms of abuse/neglect since last No Secondary Verification Process Yes visito Completed: Hospitalized since last visit: No Patient Has Alerts: Yes Has Dressing in Place as Prescribed: Yes Patient Alerts: DMII Pain Present Now: No aspirin 81 Electronic Signature(s) Signed: 10/09/2015 3:40:20 PM By: Gretta Cool, RN, BSN, Kim RN, BSN Entered By: Gretta Cool, RN, BSN, Kim on 10/09/2015 11:28:31 Mortimer, [name] ([id number]) -------------------------------------------------------------------------------- Clinic Level of Care Assessment Details Patient Name: [name] [name]. 10/09/2015 11:15 Date of Service: AM Medical Record [id number] Number: Patient Account Number: [id number] 01/25/1951 (64 y.o. Treating RN: Cornell Barman Date of Birth/Sex: Female) Other Clinician: Primary Care Physician: Dennie Fetters Treating BURNS III, WALTER Referring Physician: Dennie Fetters Physician/Extender: Weeks in Treatment: 2 Clinic Level of Care Assessment Items TOOL 4 Quantity Score []  - Use when only an EandM is performed on FOLLOW-UP visit 0 ASSESSMENTS - Nursing  Assessment / Reassessment []  - Reassessment of Co-morbidities (includes updates in patient status) 0 X - Reassessment of Adherence to Treatment Plan 1 5 ASSESSMENTS - Wound and Skin Assessment / Reassessment X - Simple Wound Assessment / Reassessment - one wound 1 5 []  - Complex Wound Assessment / Reassessment - multiple wounds 0 []  - Dermatologic / Skin Assessment (not related to wound area) 0 ASSESSMENTS - Focused Assessment []  - Circumferential Edema Measurements - multi extremities 0 []  - Nutritional Assessment / Counseling / Intervention 0 []  - Lower Extremity Assessment (monofilament, tuning fork, pulses) 0 []  - Peripheral Arterial Disease Assessment (using hand held doppler) 0 ASSESSMENTS - Ostomy and/or Continence Assessment and Care []  - Incontinence Assessment and Management 0 []  - Ostomy Care Assessment and Management (repouching, etc.) 0 PROCESS - Coordination of Care X - Simple Patient / Family Education for ongoing care 1 15 []  - Complex (extensive) Patient / Family Education for ongoing care 0 []  - Staff obtains Programmer, systems, Records, Test Results / Process Orders 0 []  - Staff telephones HHA, Nursing Homes / Clarify orders / etc 0 Lueck, SHIFFON DUNLOP. ([id number]) []  - Routine Transfer to another Facility (non-emergent condition) 0 []  - Routine Hospital Admission (non-emergent condition) 0 []  - New Admissions / Biomedical engineer / Ordering NPWT, Apligraf, etc. 0 []  - Emergency Hospital Admission (emergent condition) 0 X - Simple Discharge Coordination 1 10 []  - Complex (extensive) Discharge Coordination 0 PROCESS - Special Needs []  - Pediatric / Minor Patient Management 0 []  - Isolation Patient Management 0 []  - Hearing / Language / Visual special needs 0 []  - Assessment of Community assistance (transportation, D/C planning, etc.) 0 []  - Additional assistance / Altered mentation 0 []  - Support Surface(s) Assessment (bed, cushion, seat, etc.) 0 INTERVENTIONS - Wound  Cleansing / Measurement X - Simple Wound Cleansing - one wound 1 5 []  - Complex Wound Cleansing - multiple wounds 0 X - Wound Imaging (photographs - any number  of wounds) 1 5 []  - Wound Tracing (instead of photographs) 0 X - Simple Wound Measurement - one wound 1 5 []  - Complex Wound Measurement - multiple wounds 0 INTERVENTIONS - Wound Dressings X - Small Wound Dressing one or multiple wounds 1 10 []  - Medium Wound Dressing one or multiple wounds 0 []  - Large Wound Dressing one or multiple wounds 0 []  - Application of Medications - topical 0 []  - Application of Medications - injection 0 Halloran, Shelsey W. ([id number]) INTERVENTIONS - Miscellaneous []  - External ear exam 0 []  - Specimen Collection (cultures, biopsies, blood, body fluids, etc.) 0 []  - Specimen(s) / Culture(s) sent or taken to Lab for analysis 0 []  - Patient Transfer (multiple staff / Harrel Lemon Lift / Similar devices) 0 []  - Simple Staple / Suture removal (25 or less) 0 []  - Complex Staple / Suture removal (26 or more) 0 []  - Hypo / Hyperglycemic Management (close monitor of Blood Glucose) 0 []  - Ankle / Brachial Index (ABI) - do not check if billed separately 0 X - Vital Signs 1 5 Has the patient been seen at the hospital within the last three years: Yes Total Score: 65 Level Of Care: New/Established - Level 2 Electronic Signature(s) Signed: 10/09/2015 3:40:20 PM By: Gretta Cool, RN, BSN, Kim RN, BSN Entered By: Gretta Cool, RN, BSN, Kim on 10/09/2015 11:45:49 Gugliotta, [name] ([id number]) -------------------------------------------------------------------------------- Encounter Discharge Information Details Patient Name: [name] [name]. 10/09/2015 11:15 Date of Service: AM Medical Record [id number] Number: Patient Account Number: [id number] 15-May-1951 (64 y.o. Treating RN: Cornell Barman Date of Birth/Sex: Female) Other Clinician: Primary Care Physician: Dennie Fetters Treating BURNS III, WALTER Referring Physician:  Dennie Fetters Physician/Extender: Weeks in Treatment: 2 Encounter Discharge Information Items Schedule Follow-up Appointment: No Medication Reconciliation completed and provided to Patient/Care No Khushbu Pippen: Provided on Clinical Summary of Care: 10/09/2015 Form Type Recipient Paper Patient SL Electronic Signature(s) Signed: 10/09/2015 11:46:37 AM By: Ruthine Dose Entered By: Ruthine Dose on 10/09/2015 11:46:37 Vandervelden, [name] ([id number]) -------------------------------------------------------------------------------- Lower Extremity Assessment Details Patient Name: [name] [name]. 10/09/2015 11:15 Date of Service: AM Medical Record [id number] Number: Patient Account Number: [id number] 1951/07/05 (64 y.o. Treating RN: Cornell Barman Date of Birth/Sex: Female) Other Clinician: Primary Care Physician: Dennie Fetters Treating BURNS III, WALTER Referring Physician: Dennie Fetters Physician/Extender: Weeks in Treatment: 2 Edema Assessment Assessed: [Left: No] [Right: No] E[Left: dema] [Right: :] Calf Left: Right: Point of Measurement: 32 cm From Medial Instep cm 38.5 cm Ankle Left: Right: Point of Measurement: 11 cm From Medial Instep cm 21.6 cm Vascular Assessment Pulses: Posterior Tibial Palpable: [Right:Yes] Dorsalis Pedis Palpable: [Right:Yes] Extremity colors, hair growth, and conditions: Extremity Color: [Right:Mottled] Hair Growth on Extremity: [Right:No] Temperature of Extremity: [Right:Warm] Capillary Refill: [Right:< 3 seconds] Toe Nail Assessment Left: Right: Thick: No Discolored: No Deformed: No Improper Length and Hygiene: No Electronic Signature(s) Signed: 10/09/2015 3:40:20 PM By: Gretta Cool, RN, BSN, Kim RN, BSN Scalzo, McCaulley ([id number]) Entered By: Gretta Cool, RN, BSN, Kim on 10/09/2015 11:35:54 Kroeker, [name] ([id number]) -------------------------------------------------------------------------------- Multi Wound Chart  Details Patient Name: [name] [name]. 10/09/2015 11:15 Date of Service: AM Medical Record [id number] Number: Patient Account Number: [id number] 13-Aug-1951 (64 y.o. Treating RN: Cornell Barman Date of Birth/Sex: Female) Other Clinician: Primary Care Physician: Dennie Fetters Treating BURNS III, WALTER Referring Physician: Dennie Fetters Physician/Extender: Weeks in Treatment: 2 Vital Signs Height(in): 64 Pulse(bpm): Weight(lbs): 188 Blood Pressure 84/64 (mmHg): Body Mass Index(BMI): 32 Temperature(F): 97.4 Respiratory Rate 18 (breaths/min): Photos: [1:No Photos] [N/A:N/A] Wound Location: [  1:Right Lower Leg - Lateral] [N/A:N/A] Wounding Event: [1:Blister] [N/A:N/A] Primary Etiology: [1:Venous Leg Ulcer] [N/A:N/A] Comorbid History: [1:Anemia, Coronary Artery Disease, Hypertension, Type II Diabetes, Neuropathy] [N/A:N/A] Date Acquired: [1:09/04/2015] [N/A:N/A] Weeks of Treatment: [1:2] [N/A:N/A] Wound Status: [1:Open] [N/A:N/A] Clustered Wound: [1:Yes] [N/A:N/A] Measurements L x W x D 3.9x1.5x0.1 [N/A:N/A] (cm) Area (cm) : [1:4.595] [N/A:N/A] Volume (cm) : [1:0.459] [N/A:N/A] % Reduction in Area: [1:81.10%] [N/A:N/A] % Reduction in Volume: 81.10% [N/A:N/A] Classification: [1:Partial Thickness] [N/A:N/A] HBO Classification: [1:Grade 1] [N/A:N/A] Exudate Amount: [1:Small] [N/A:N/A] Exudate Type: [1:Serous] [N/A:N/A] Exudate Color: [1:amber] [N/A:N/A] Wound Margin: [1:Flat and Intact] [N/A:N/A] Granulation Amount: [1:None Present (0%)] [N/A:N/A] Necrotic Amount: [1:Large (67-100%)] [N/A:N/A] Necrotic Tissue: [1:Eschar] [N/A:N/A] Exposed Structures: [N/A:N/A] Fascia: No Fat: No Tendon: No Muscle: No Joint: No Bone: No Limited to Skin Breakdown Epithelialization: None N/A N/A Periwound Skin Texture: Edema: No N/A N/A Excoriation: No Induration: No Callus: No Crepitus: No Fluctuance: No Friable: No Rash: No Scarring: No Periwound Skin Dry/Scaly:  Yes N/A N/A Moisture: Maceration: No Moist: No Periwound Skin Color: Atrophie Blanche: No N/A N/A Cyanosis: No Ecchymosis: No Erythema: No Hemosiderin Staining: No Mottled: No Pallor: No Rubor: No Tenderness on Yes N/A N/A Palpation: Wound Preparation: Ulcer Cleansing: N/A N/A Rinsed/Irrigated with Saline Topical Anesthetic Applied: Other: lidocaine 4% Treatment Notes Electronic Signature(s) Signed: 10/09/2015 3:40:20 PM By: Gretta Cool, RN, BSN, Kim RN, BSN Entered By: Gretta Cool, RN, BSN, Kim on 10/09/2015 11:36:27 Hoban, [name] ([id number]) -------------------------------------------------------------------------------- Multi-Disciplinary Care Plan Details Patient Name: JENNETTE, PELPHREY 10/09/2015 11:15 Date of Service: AM Medical Record [id number] Number: Patient Account Number: [id number] [birth year] (64 y.o. Treating RN: Cornell Barman Date of Birth/Sex: Female) Other Clinician: Primary Care Physician: Dennie Fetters Treating BURNS III, WALTER Referring Physician: Dennie Fetters Physician/Extender: Weeks in Treatment: 2 Active Inactive Electronic Signature(s) Signed: 10/14/2015 5:17:02 PM By: Gretta Cool RN, BSN, Kim RN, BSN Previous Signature: 10/09/2015 3:40:20 PM Version By: Gretta Cool RN, BSN, Kim RN, BSN Entered By: Gretta Cool, RN, BSN, Kim on 10/14/2015 10:33:22 Heckert, [name] ([id number]) -------------------------------------------------------------------------------- Pain Assessment Details Patient Name: SHAILA, HARDIN 10/09/2015 11:15 Date of Service: AM Medical Record [id number] Number: Patient Account Number: [id number] 02-19-1951 (64 y.o. Treating RN: Cornell Barman Date of Birth/Sex: Female) Other Clinician: Primary Care Physician: Dennie Fetters Treating BURNS III, WALTER Referring Physician: Dennie Fetters Physician/Extender: Weeks in Treatment: 2 Active Problems Location of Pain Severity and Description of Pain Patient Has Paino No Site  Locations Pain Management and Medication Current Pain Management: Electronic Signature(s) Signed: 10/09/2015 3:40:20 PM By: Gretta Cool, RN, BSN, Kim RN, BSN Entered By: Gretta Cool, RN, BSN, Kim on 10/09/2015 11:29:41 Freeland, [name] ([id number]) -------------------------------------------------------------------------------- Patient/Caregiver Education Details Patient Name: [name] [name]. 10/09/2015 11:15 Date of Service: AM Medical Record [id number] Number: Patient Account Number: [id number] May 24, 1951 (64 y.o. Treating RN: Cornell Barman Date of Birth/Gender: Female) Other Clinician: Primary Care Physician: Dennie Fetters Treating BURNS III, WALTER Referring Physician: Dennie Fetters Physician/Extender: [place] in Treatment: 2 Education Assessment Education Provided To: Patient Education Topics Provided Venous: Handouts: Controlling Swelling with Compression Stockings , Other: reviewed Juxtalite application Methods: Demonstration, Explain/Verbal Responses: State content correctly Wound/Skin Impairment: Handouts: Caring for Your Ulcer Electronic Signature(s) Signed: 10/09/2015 3:40:20 PM By: Gretta Cool, RN, BSN, Kim RN, BSN Entered By: Gretta Cool, RN, BSN, Kim on 10/09/2015 11:47:19 Demetro, [name] ([id number]) -------------------------------------------------------------------------------- Wound Assessment Details Patient Name: [name] [name]. 10/09/2015 11:15 Date of Service: AM Medical Record [id number] Number: Patient Account Number: [id number] 1951-05-17 (63 y.o. Treating RN: Cornell Barman Date of Birth/Sex: Female) Other Clinician: Primary  Care Physician: Dennie Fetters Treating BURNS III, WALTER Referring Physician: Dennie Fetters Physician/Extender: Weeks in Treatment: 2 Wound Status Wound Number: 1 Primary Venous Leg Ulcer Etiology: Wound Location: Right, Lateral Lower Leg Wound Healed - Epithelialized Wounding Event: Blister Status: Date Acquired:  09/04/2015 Comorbid Anemia, Coronary Artery Disease, Weeks Of Treatment: 2 History: Hypertension, Type II Diabetes, Clustered Wound: Yes Neuropathy Wound Measurements Length: (cm) 0 % Reductio Width: (cm) 0 % Reductio Depth: (cm) 0 Epithelial Area: (cm) 0 Tunneling Volume: (cm) 0 Undermini n in Area: 100% n in Volume: 100% ization: None : No ng: No Wound Description Classification: Partial Thickness Foul Odor Diabetic Severity (Wagner): Grade 1 Wound Margin: Flat and Intact Exudate Amount: Small Exudate Type: Serous Exudate Color: amber After Cleansing: No Wound Bed Granulation Amount: None Present (0%) Exposed Structure Necrotic Amount: Large (67-100%) Fascia Exposed: No Necrotic Quality: Eschar Fat Layer Exposed: No Tendon Exposed: No Muscle Exposed: No Joint Exposed: No Bone Exposed: No Limited to Skin Breakdown Periwound Skin Texture Texture Color No Abnormalities Noted: No No Abnormalities Noted: No Agerton, Laurrie W. ([id number]) Callus: No Atrophie Blanche: No Crepitus: No Cyanosis: No Excoriation: No Ecchymosis: No Fluctuance: No Erythema: No Friable: No Hemosiderin Staining: No Induration: No Mottled: No Localized Edema: No Pallor: No Rash: No Rubor: No Scarring: No Temperature / Pain Moisture Tenderness on Palpation: Yes No Abnormalities Noted: No Dry / Scaly: Yes Maceration: No Moist: No Wound Preparation Ulcer Cleansing: Rinsed/Irrigated with Saline Topical Anesthetic Applied: Other: lidocaine 4%, Electronic Signature(s) Signed: 10/09/2015 3:40:20 PM By: Gretta Cool, RN, BSN, Kim RN, BSN Entered By: Gretta Cool, RN, BSN, Kim on 10/09/2015 11:42:22 Carmean, [name] ([id number]) -------------------------------------------------------------------------------- Vitals Details Patient Name: [name] [name]. 10/09/2015 11:15 Date of Service: AM Medical Record [id number] Number: Patient Account Number: [id number] 11/16/1951 (64 y.o. Treating  RN: Cornell Barman Date of Birth/Sex: Female) Other Clinician: Primary Care Physician: Dennie Fetters Treating BURNS III, WALTER Referring Physician: Dennie Fetters Physician/Extender: Weeks in Treatment: 2 Vital Signs Time Taken: 11:29 Temperature (F): 97.4 Height (in): 64 Respiratory Rate (breaths/min): 18 Weight (lbs): 188 Blood Pressure (mmHg): 84/64 Body Mass Index (BMI): 32.3 Reference Range: 80 - 120 mg / dl Electronic Signature(s) Signed: 10/09/2015 3:40:20 PM By: Gretta Cool, RN, BSN, Kim RN, BSN Entered By: Gretta Cool, RN, BSN, Kim on 10/09/2015 11:29:58

## 2015-10-25 ENCOUNTER — Encounter (HOSPITAL_BASED_OUTPATIENT_CLINIC_OR_DEPARTMENT_OTHER): Payer: Medicare Other

## 2015-11-25 ENCOUNTER — Other Ambulatory Visit: Payer: Self-pay | Admitting: Internal Medicine

## 2015-12-02 ENCOUNTER — Encounter (HOSPITAL_COMMUNITY): Payer: Self-pay | Admitting: Family Medicine

## 2015-12-02 ENCOUNTER — Emergency Department (HOSPITAL_COMMUNITY): Payer: Medicare Other

## 2015-12-02 ENCOUNTER — Encounter: Payer: Self-pay | Admitting: Family Medicine

## 2015-12-02 ENCOUNTER — Observation Stay (HOSPITAL_COMMUNITY)
Admission: EM | Admit: 2015-12-02 | Discharge: 2015-12-03 | Disposition: A | Discharge status: Home / Self Care | Payer: Medicare Other | Attending: Family Medicine | Admitting: Family Medicine

## 2015-12-02 ENCOUNTER — Ambulatory Visit (INDEPENDENT_AMBULATORY_CARE_PROVIDER_SITE_OTHER): Payer: Medicare Other | Admitting: Family Medicine

## 2015-12-02 VITALS — BP 154/86 | HR 75 | Temp 98.6°F | Wt 183.2 lb

## 2015-12-02 DIAGNOSIS — E785 Hyperlipidemia, unspecified: Secondary | ICD-10-CM | POA: Insufficient documentation

## 2015-12-02 DIAGNOSIS — T782XXA Anaphylactic shock, unspecified, initial encounter: Principal | ICD-10-CM | POA: Insufficient documentation

## 2015-12-02 DIAGNOSIS — R0902 Hypoxemia: Secondary | ICD-10-CM | POA: Diagnosis present

## 2015-12-02 DIAGNOSIS — E1122 Type 2 diabetes mellitus with diabetic chronic kidney disease: Secondary | ICD-10-CM | POA: Insufficient documentation

## 2015-12-02 DIAGNOSIS — E1169 Type 2 diabetes mellitus with other specified complication: Secondary | ICD-10-CM

## 2015-12-02 DIAGNOSIS — D649 Anemia, unspecified: Secondary | ICD-10-CM | POA: Diagnosis not present

## 2015-12-02 DIAGNOSIS — R220 Localized swelling, mass and lump, head: Secondary | ICD-10-CM | POA: Diagnosis present

## 2015-12-02 DIAGNOSIS — Z992 Dependence on renal dialysis: Secondary | ICD-10-CM | POA: Diagnosis not present

## 2015-12-02 DIAGNOSIS — I120 Hypertensive chronic kidney disease with stage 5 chronic kidney disease or end stage renal disease: Secondary | ICD-10-CM | POA: Diagnosis not present

## 2015-12-02 DIAGNOSIS — R05 Cough: Secondary | ICD-10-CM | POA: Insufficient documentation

## 2015-12-02 DIAGNOSIS — E113399 Type 2 diabetes mellitus with moderate nonproliferative diabetic retinopathy without macular edema, unspecified eye: Secondary | ICD-10-CM | POA: Diagnosis not present

## 2015-12-02 DIAGNOSIS — N186 End stage renal disease: Secondary | ICD-10-CM | POA: Diagnosis not present

## 2015-12-02 DIAGNOSIS — Z7982 Long term (current) use of aspirin: Secondary | ICD-10-CM | POA: Insufficient documentation

## 2015-12-02 DIAGNOSIS — Z7951 Long term (current) use of inhaled steroids: Secondary | ICD-10-CM | POA: Diagnosis not present

## 2015-12-02 DIAGNOSIS — E669 Obesity, unspecified: Secondary | ICD-10-CM

## 2015-12-02 DIAGNOSIS — L03211 Cellulitis of face: Secondary | ICD-10-CM | POA: Diagnosis not present

## 2015-12-02 DIAGNOSIS — I255 Ischemic cardiomyopathy: Secondary | ICD-10-CM | POA: Diagnosis not present

## 2015-12-02 DIAGNOSIS — I2510 Atherosclerotic heart disease of native coronary artery without angina pectoris: Secondary | ICD-10-CM | POA: Diagnosis not present

## 2015-12-02 DIAGNOSIS — T783XXA Angioneurotic edema, initial encounter: Secondary | ICD-10-CM | POA: Insufficient documentation

## 2015-12-02 DIAGNOSIS — Z792 Long term (current) use of antibiotics: Secondary | ICD-10-CM | POA: Insufficient documentation

## 2015-12-02 DIAGNOSIS — Z905 Acquired absence of kidney: Secondary | ICD-10-CM | POA: Diagnosis not present

## 2015-12-02 DIAGNOSIS — Z79899 Other long term (current) drug therapy: Secondary | ICD-10-CM | POA: Insufficient documentation

## 2015-12-02 DIAGNOSIS — N2581 Secondary hyperparathyroidism of renal origin: Secondary | ICD-10-CM | POA: Diagnosis not present

## 2015-12-02 LAB — CBC WITH DIFFERENTIAL/PLATELET
BASOS ABS: 0.1 10*3/uL (ref 0.0–0.1)
BASOS PCT: 1 %
EOS ABS: 0.1 10*3/uL (ref 0.0–0.7)
EOS PCT: 2 %
HCT: 34.1 % — ABNORMAL LOW (ref 36.0–46.0)
HEMOGLOBIN: 10.8 g/dL — AB (ref 12.0–15.0)
LYMPHS ABS: 0.6 10*3/uL — AB (ref 0.7–4.0)
Lymphocytes Relative: 10 %
MCH: 31.2 pg (ref 26.0–34.0)
MCHC: 31.7 g/dL (ref 30.0–36.0)
MCV: 98.6 fL (ref 78.0–100.0)
Monocytes Absolute: 0.9 10*3/uL (ref 0.1–1.0)
Monocytes Relative: 16 %
NEUTROS PCT: 71 %
Neutro Abs: 3.9 10*3/uL (ref 1.7–7.7)
PLATELETS: 213 10*3/uL (ref 150–400)
RBC: 3.46 MIL/uL — AB (ref 3.87–5.11)
RDW: 16.2 % — ABNORMAL HIGH (ref 11.5–15.5)
WBC: 5.6 10*3/uL (ref 4.0–10.5)

## 2015-12-02 MED ORDER — DIPHENHYDRAMINE HCL 50 MG/ML IJ SOLN
12.5000 mg | Freq: Once | INTRAMUSCULAR | Status: AC
  Administered 2015-12-02: 12.5 mg via INTRAVENOUS
  Filled 2015-12-02: qty 1

## 2015-12-02 MED ORDER — METHYLPREDNISOLONE SODIUM SUCC 125 MG IJ SOLR
125.0000 mg | Freq: Once | INTRAMUSCULAR | Status: AC
  Administered 2015-12-02: 125 mg via INTRAVENOUS
  Filled 2015-12-02: qty 2

## 2015-12-02 MED ORDER — FAMOTIDINE IN NACL 20-0.9 MG/50ML-% IV SOLN
20.0000 mg | Freq: Once | INTRAVENOUS | Status: AC
  Administered 2015-12-02: 20 mg via INTRAVENOUS
  Filled 2015-12-02: qty 50

## 2015-12-02 NOTE — Patient Instructions (Signed)
Angioedema  Angioedema is sudden puffiness (swelling), often of the skin. It can happen:  · On your face or privates (genitals).  · In your belly (abdomen) or other body parts.  It usually happens quickly and gets better in 1 or 2 days. It often starts at night and is found when you wake up. You may get red, itchy patches of skin (hives). Attacks can be dangerous if your breathing passages get puffy.  The condition may happen only once, or it can come back at random times. It may happen for several years before it goes away for good.  HOME CARE  · Only take medicines as told by your doctor.  · Always carry your emergency allergy medicines with you.  · Wear a medical bracelet as told by your doctor.  · Avoid things that you know will cause attacks (triggers).  GET HELP IF:  · You have another attack.  · Your attacks happen more often or get worse.  · The condition was passed to you by your parents and you want to have children.  GET HELP RIGHT AWAY IF:   · Your mouth, tongue, or lips are very puffy.  · You have trouble breathing.  · You have trouble swallowing.  · You pass out (faint).  MAKE SURE YOU:   · Understand these instructions.  · Will watch your condition.  · Will get help right away if you are not doing well or get worse.     This information is not intended to replace advice given to you by your health care provider. Make sure you discuss any questions you have with your health care provider.     Document Released: 10/21/2009 Document Revised: 08/23/2013 Document Reviewed: 06/26/2013  Elsevier Interactive Patient Education ©2016 [employer]

## 2015-12-02 NOTE — Progress Notes (Signed)
RASH Started w/ dry/itchy eyes. Progressed to lips. Nothing has helped. Persistent dry cough is present. She believes swelling started when she applied over-the-counter eyedrops to help with the dry itchy eyes. Endorses posttussive emesis, as well as some difficulty swallowing.  Had rash for 2 days. Location: eyes, lips, some difficulty swallowing Medications tried: mucinex, benadryl Similar rash in past: no New medications or antibiotics: no Tick, Insect or new pet exposure: no Recent travel: no New detergent or soap: no Immunocompromised: no  Symptoms Itching: yes, eyes Pain over rash: no Feeling ill all over: no Fever: no Mouth sores: no Face or tongue swelling: yes, lips Trouble breathing: only with pain Joint swelling or pain: no  Review of Symptoms - see HPI PMH - Smoking status noted.    Objective: BP 154/86 mmHg  Pulse 75  Temp(Src) 98.6 F (37 C) (Oral)  Wt 183 lb 3.2 oz (83.099 kg) Gen: NAD, alert, cooperative HEENT: NCAT, EOMI, PERRL, injected sclera, periorbital edema present, MMM, no oral lesions appreciated. No LAD CV: Regular rhythm, irregular rate, no murmur Resp: Bilateral wheezing appreciated, no crackles, no increased work of breathing Ext: No edema, warm Neuro: Alert and oriented, Speech clear, diminished hearing, otherwise no gross deficits  Assessment and plan:  Angioedema Patient presented today with a 2 day history of angioedema of her eyes and lips. She states that symptoms have been persistent. Triggering agent has not been pinpoint at this time however patient did use over-the-counter eyedrops for dry/itchy eyes the day of symptom onset. Patient has taken Benadryl and Mucinex little to no benefits. In our office patient was short of breath secondary to a persistent dry cough, and patient stated that she had some symptoms of dysphagia. Patient is currently stable however there are some concerns for anaphylaxis/anaphylactic symptoms. - Due to  concerns for anaphylaxis I have arranged to have patient seen in the ED for further workup and observation.     Elberta Leatherwood, MD,MS,  PGY2 12/02/2015 2:59 PM

## 2015-12-02 NOTE — ED Provider Notes (Signed)
CSN: [id number]     Arrival date & time 12/02/15  1500 History   First MD Initiated Contact with Patient 12/02/15 2149     Chief Complaint  Patient presents with  . Cough     (Consider location/radiation/quality/duration/timing/severity/associated sxs/prior Treatment) HPI Comments: Patient with a history of ESRD-HD (Tues, Thurs, Sat), HTN, ischemic cardiomyopathy presents with new onset facial swelling over the last 3 days. She reports the symptoms as progressive, first involving bilateral eye lids 2 days ago, spreading to facial redness throughout that day, and lip and tongue swelling today with "scratchy throat". She reports using a new eye drop and Mucinex Allergy the night before symptoms began. She was seen by her doctor at [facility] today and sent to the emergency department for further evaluation and management.   Additionally of concern, the patient reports a persistent cough for the past 2 months without fever. The cough is not responsive to cough or cold medication. It causes post-tussive emesis while trying to eat or drink.   Patient is a 65 y.o. female presenting with cough. The history is provided by the patient. No language interpreter was used.  Cough Associated symptoms: sore throat   Associated symptoms: no chest pain, no chills and no fever     Past Medical History  Diagnosis Date  . Hearing difficulty     Mild   . Diabetes mellitus ([place])   . Coronary artery disease     a. cath 6/16 Severe multivessel cad with diffuse significant segmental LAD stenosis, 95% stenosis in a large second diagonal, 75% ostial stenosis in the smaller first diagonal, 95% mid and 65% distal circumflex, 95% ostial/proximal PDA, and 40-50% left main ostial disease. Med rx initially, to be re-evaluated by CVTS.  Marland Kitchen ESRD (end stage renal disease) on dialysis ([place])        . Hypertension   . Ischemic cardiomyopathy     a. Echo 04/2015: EF 35%. Cath same time - 45%.  . Syncope   .  Hyponatremia   . Anemia   . Thrombocytopenia ([place])   . Memory loss   . Hyperlipidemia    Past Surgical History  Procedure Laterality Date  . 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 Esophagogastroduodenoscopy Left 02/20/2013    Procedure: ESOPHAGOGASTRODUODENOSCOPY (EGD);  Surgeon: Cleotis Nipper, MD;  Location: [facility] ENDOSCOPY;  Service: Endoscopy;  Laterality: Left;  . Av fistula placement Left 03/07/2013    Procedure: ARTERIOVENOUS (AV) FISTULA CREATION;  Surgeon: Elam Dutch, MD;  Location: [facility] OR;  Service: Vascular;  Laterality: Left;  left brachiocephalic AVF  . Cardiac catheterization N/A 04/25/2015    Procedure: Left Heart Cath and Coronary Angiography;  Surgeon: Belva Crome, MD;  Location: [facility] CV LAB;  Service: Cardiovascular;  Laterality: N/A;   Family History  Problem Relation Age of Onset  . Heart disease    . Diabetes    . Stroke    . Bone cancer Father     Of jaws   . Throat cancer Father    Social History  Substance Use Topics  . Smoking status: Never Smoker   . Smokeless tobacco: Never Used  . Alcohol Use: No   OB History    No data available     Review of Systems  Constitutional: Negative for fever and chills.  HENT: Positive for facial swelling and sore throat. Negative for congestion.        See HPI.  Respiratory: Positive for cough.   Cardiovascular: Negative.  Negative for chest pain.  Gastrointestinal: Negative.  Negative for nausea and abdominal pain.  Musculoskeletal: Negative.   Skin: Positive for color change.  Neurological: Negative.       Allergies  Aleve; Motrin; and Nsaids  Home Medications   Prior to Admission medications   Medication Sig Start Date End Date Taking? Authorizing Provider  acetaminophen (TYLENOL) 500 MG tablet Take 500 mg by mouth every 6 (six) hours as needed for headache.    Historical Provider, MD  aspirin 81 MG tablet Take 81 mg by mouth daily.    Historical Provider, MD   atorvastatin (LIPITOR) 40 MG tablet Take 40 mg by mouth every morning.    Historical Provider, MD  calcium carbonate (OS-CAL - DOSED IN MG OF ELEMENTAL CALCIUM) 1250 (500 CA) MG tablet Take two (2) tablets by mouth three (3) times daily with meals and take two (2) tablets by mouth with snacks.    Historical Provider, MD  cephALEXin (KEFLEX) 250 MG capsule Take 1 capsule (250 mg total) by mouth 2 (two) times daily. 09/16/15   Wandra Arthurs, MD  docusate sodium (COLACE) 100 MG capsule Take 100 mg by mouth daily as needed for mild constipation or moderate constipation.    Historical Provider, MD  fluticasone (FLONASE) 50 MCG/ACT nasal spray Place 1 spray into both nostrils daily as needed for allergies or rhinitis.    Historical Provider, MD  isosorbide mononitrate (ISMO,MONOKET) 10 MG tablet Take 1.5 tablets (15 mg total) by mouth daily. 09/25/15   Belva Crome, MD  lanthanum (FOSRENOL) 1000 MG chewable tablet Chew 1,000 mg by mouth 3 (three) times daily with meals.    Historical Provider, MD  multivitamin (RENA-VIT) TABS tablet Take 1 tablet by mouth at bedtime. 09/04/14   Leone Haven, MD  ondansetron (ZOFRAN) 4 MG tablet Take 4 mg by mouth every 6 (six) hours as needed for nausea or vomiting.    Historical Provider, MD  pantoprazole (PROTONIX) 40 MG tablet TAKE 1 TABLET BY MOUTH DAILY 11/26/15   Smiley Houseman, MD  promethazine (PHENERGAN) 25 MG tablet Take 25 mg by mouth every 6 (six) hours as needed for nausea or vomiting.    Historical Provider, MD   BP 139/104 mmHg  Pulse 82  Temp(Src) 98.4 F (36.9 C) (Oral)  Resp 18  Ht 5\' 4"  (1.626 m)  Wt 84.823 kg  BMI 32.08 kg/m2  SpO2 96% Physical Exam  Constitutional: She is oriented to person, place, and time. She appears well-developed and well-nourished.  HENT:  Head: Normocephalic.  Erythema and bilateral eye lid swelling with normal conjunctiva. There is mild erythema over bilateral face with mild to moderate lip swelling. Tongue  minimally swollen. Oropharynx benign.   Eyes: Conjunctivae are normal.  Neck: Normal range of motion. Neck supple.  Cardiovascular: Normal rate and regular rhythm.   Pulmonary/Chest: Effort normal. No stridor. She has no wheezes. She has rales.  Actively coughing.  Abdominal: Soft. Bowel sounds are normal. There is no tenderness. There is no rebound and no guarding.  Musculoskeletal: Normal range of motion.  Neurological: She is alert and oriented to person, place, and time.  Skin: Skin is warm and dry. No rash noted.  Psychiatric: She has a normal mood and affect.    ED Course  Procedures (including critical care time) Labs Review Labs Reviewed  CBC WITH DIFFERENTIAL/PLATELET  COMPREHENSIVE METABOLIC PANEL   Results for orders placed or performed  during the hospital encounter of 12/02/15  CBC with Differential  Result Value Ref Range   WBC 5.6 4.0 - 10.5 K/uL   RBC 3.46 (L) 3.87 - 5.11 MIL/uL   Hemoglobin 10.8 (L) 12.0 - 15.0 g/dL   HCT 34.1 (L) 36.0 - 46.0 %   MCV 98.6 78.0 - 100.0 fL   MCH 31.2 26.0 - 34.0 pg   MCHC 31.7 30.0 - 36.0 g/dL   RDW 16.2 (H) 11.5 - 15.5 %   Platelets 213 150 - 400 K/uL   Neutrophils Relative % 71 %   Neutro Abs 3.9 1.7 - 7.7 K/uL   Lymphocytes Relative 10 %   Lymphs Abs 0.6 (L) 0.7 - 4.0 K/uL   Monocytes Relative 16 %   Monocytes Absolute 0.9 0.1 - 1.0 K/uL   Eosinophils Relative 2 %   Eosinophils Absolute 0.1 0.0 - 0.7 K/uL   Basophils Relative 1 %   Basophils Absolute 0.1 0.0 - 0.1 K/uL  Comprehensive metabolic panel  Result Value Ref Range   Sodium 134 (L) 135 - 145 mmol/L   Potassium 3.9 3.5 - 5.1 mmol/L   Chloride 94 (L) 101 - 111 mmol/L   CO2 26 22 - 32 mmol/L   Glucose, Bld 109 (H) 65 - 99 mg/dL   BUN 24 (H) 6 - 20 mg/dL   Creatinine, Ser 4.81 (H) 0.44 - 1.00 mg/dL   Calcium 8.8 (L) 8.9 - 10.3 mg/dL   Total Protein 6.1 (L) 6.5 - 8.1 g/dL   Albumin 3.0 (L) 3.5 - 5.0 g/dL   AST 23 15 - 41 U/L   ALT 10 (L) 14 - 54 U/L    Alkaline Phosphatase 85 38 - 126 U/L   Total Bilirubin 1.2 0.3 - 1.2 mg/dL   GFR calc non Af Amer 9 (L) >60 mL/min   GFR calc Af Amer 10 (L) >60 mL/min   Anion gap 14 5 - 15  Troponin I  Result Value Ref Range   Troponin I 0.06 (H) <0.031 ng/mL  Brain natriuretic peptide  Result Value Ref Range   B Natriuretic Peptide >4500.0 (H) 0.0 - 100.0 pg/mL   Dg Chest 2 View  12/02/2015  CLINICAL DATA:  65 year old female with cough and shortness of breath EXAM: CHEST  2 VIEW COMPARISON:  Radiograph dated 12/16/2014 FINDINGS: Two views of the chest do not demonstrate a focal consolidation. There is left lung base subsegmental atelectatic changes versus scarring. There is no pleural effusion or pneumothorax. Stable cardiac silhouette. The osseous structures appear unremarkable. IMPRESSION: No focal consolidation or pneumothorax. Electronically Signed   By: Anner Crete M.D.   On: 12/02/2015 22:47    Imaging Review Dg Chest 2 View  12/02/2015  CLINICAL DATA:  65 year old female with cough and shortness of breath EXAM: CHEST  2 VIEW COMPARISON:  Radiograph dated 12/16/2014 FINDINGS: Two views of the chest do not demonstrate a focal consolidation. There is left lung base subsegmental atelectatic changes versus scarring. There is no pleural effusion or pneumothorax. Stable cardiac silhouette. The osseous structures appear unremarkable. IMPRESSION: No focal consolidation or pneumothorax. Electronically Signed   By: Anner Crete M.D.   On: 12/02/2015 22:47   I have personally reviewed and evaluated these images and lab results as part of my medical decision-making.   EKG Interpretation None      MDM   Final diagnoses:  None    1. Mild facial cellulitis 2. Hypoxia 3. Cough  The patient presents by referral of  PCP ([facility]) for evaluation and management of possible anaphylaxis vs angioedema. She is given IV solumedrol, Pepcid and Benadryl with minimal improvement of facial redness and  swelling. She does not appear to have any difficulty with swallowing or breathing. Erythema is generalized over face - question facial cellulitis over allergic reaction.   She persistently has a cough throughout evaluation. Negative CXR. Tussionex provided for relief.  On re-evaluation, the patient is found to have a pulse ox of 85%. She is awake. She is encouraged to sit up, breathe deeply. O2 sats improve to 90% then return to upper 80's. 2L via [place] O2 started. Albuterol ordered.   The patient is evaluated by Dr. Ralene Bathe. Given the patient's complicated medical history, likely facial cellulitis and, now, hypoxia with persistent cough, the patient is felt to benefit from observation admission.     Charlann Lange, PA-C 12/03/15 [id number]  Quintella Reichert, MD 12/03/15 2252

## 2015-12-02 NOTE — ED Notes (Addendum)
Pt here for cough x a few month with eye swelling and facial swelling that is new. sts possible allergic reaction to eye drops or lip balm. sts she has been taking benadryl for the swelling. Pt is a dialysis pt. Pt severe cough with throat irritation and oral swelling.

## 2015-12-02 NOTE — Assessment & Plan Note (Signed)
Patient presented today with a 2 day history of angioedema of her eyes and lips. She states that symptoms have been persistent. Triggering agent has not been pinpoint at this time however patient did use over-the-counter eyedrops for dry/itchy eyes the day of symptom onset. Patient has taken Benadryl and Mucinex little to no benefits. In our office patient was short of breath secondary to a persistent dry cough, and patient stated that she had some symptoms of dysphagia. Patient is currently stable however there are some concerns for anaphylaxis/anaphylactic symptoms. - Due to concerns for anaphylaxis I have arranged to have patient seen in the ED for further workup and observation.

## 2015-12-03 ENCOUNTER — Other Ambulatory Visit: Payer: Self-pay

## 2015-12-03 DIAGNOSIS — N186 End stage renal disease: Secondary | ICD-10-CM

## 2015-12-03 DIAGNOSIS — L03211 Cellulitis of face: Secondary | ICD-10-CM | POA: Diagnosis present

## 2015-12-03 DIAGNOSIS — E119 Type 2 diabetes mellitus without complications: Secondary | ICD-10-CM | POA: Diagnosis not present

## 2015-12-03 DIAGNOSIS — I2510 Atherosclerotic heart disease of native coronary artery without angina pectoris: Secondary | ICD-10-CM

## 2015-12-03 DIAGNOSIS — R220 Localized swelling, mass and lump, head: Secondary | ICD-10-CM | POA: Diagnosis not present

## 2015-12-03 DIAGNOSIS — E669 Obesity, unspecified: Secondary | ICD-10-CM

## 2015-12-03 DIAGNOSIS — R0902 Hypoxemia: Secondary | ICD-10-CM | POA: Diagnosis not present

## 2015-12-03 LAB — CBC
HCT: 32.1 % — ABNORMAL LOW (ref 36.0–46.0)
Hemoglobin: 10.4 g/dL — ABNORMAL LOW (ref 12.0–15.0)
MCH: 31.6 pg (ref 26.0–34.0)
MCHC: 32.4 g/dL (ref 30.0–36.0)
MCV: 97.6 fL (ref 78.0–100.0)
Platelets: 199 10*3/uL (ref 150–400)
RBC: 3.29 MIL/uL — ABNORMAL LOW (ref 3.87–5.11)
RDW: 16.3 % — ABNORMAL HIGH (ref 11.5–15.5)
WBC: 3.9 10*3/uL — ABNORMAL LOW (ref 4.0–10.5)

## 2015-12-03 LAB — COMPREHENSIVE METABOLIC PANEL
ALT: 10 U/L — ABNORMAL LOW (ref 14–54)
ANION GAP: 14 (ref 5–15)
AST: 23 U/L (ref 15–41)
Albumin: 3 g/dL — ABNORMAL LOW (ref 3.5–5.0)
Alkaline Phosphatase: 85 U/L (ref 38–126)
BUN: 24 mg/dL — AB (ref 6–20)
CHLORIDE: 94 mmol/L — AB (ref 101–111)
CO2: 26 mmol/L (ref 22–32)
Calcium: 8.8 mg/dL — ABNORMAL LOW (ref 8.9–10.3)
Creatinine, Ser: 4.81 mg/dL — ABNORMAL HIGH (ref 0.44–1.00)
GFR calc Af Amer: 10 mL/min — ABNORMAL LOW (ref >60)
GFR calc non Af Amer: 9 mL/min — ABNORMAL LOW (ref >60)
GLUCOSE: 109 mg/dL — AB (ref 65–99)
POTASSIUM: 3.9 mmol/L (ref 3.5–5.1)
Sodium: 134 mmol/L — ABNORMAL LOW (ref 135–145)
TOTAL PROTEIN: 6.1 g/dL — AB (ref 6.5–8.1)
Total Bilirubin: 1.2 mg/dL (ref 0.3–1.2)

## 2015-12-03 LAB — RENAL FUNCTION PANEL
Albumin: 2.9 g/dL — ABNORMAL LOW (ref 3.5–5.0)
Anion gap: 17 — ABNORMAL HIGH (ref 5–15)
BUN: 31 mg/dL — ABNORMAL HIGH (ref 6–20)
CO2: 24 mmol/L (ref 22–32)
Calcium: 8.9 mg/dL (ref 8.9–10.3)
Chloride: 91 mmol/L — ABNORMAL LOW (ref 101–111)
Creatinine, Ser: 5.23 mg/dL — ABNORMAL HIGH (ref 0.44–1.00)
GFR calc Af Amer: 9 mL/min — ABNORMAL LOW (ref >60)
GFR calc non Af Amer: 8 mL/min — ABNORMAL LOW (ref >60)
Glucose, Bld: 209 mg/dL — ABNORMAL HIGH (ref 65–99)
Phosphorus: 4.8 mg/dL — ABNORMAL HIGH (ref 2.5–4.6)
Potassium: 4.2 mmol/L (ref 3.5–5.1)
Sodium: 132 mmol/L — ABNORMAL LOW (ref 135–145)

## 2015-12-03 LAB — TROPONIN I: TROPONIN I: 0.06 ng/mL — AB (ref <0.031)

## 2015-12-03 LAB — GLUCOSE, CAPILLARY
Glucose-Capillary: 156 mg/dL — ABNORMAL HIGH (ref 65–99)
Glucose-Capillary: 191 mg/dL — ABNORMAL HIGH (ref 65–99)

## 2015-12-03 LAB — BRAIN NATRIURETIC PEPTIDE

## 2015-12-03 MED ORDER — ATORVASTATIN CALCIUM 40 MG PO TABS
40.0000 mg | ORAL_TABLET | Freq: Every morning | ORAL | Status: DC
  Administered 2015-12-03: 40 mg via ORAL
  Filled 2015-12-03: qty 1

## 2015-12-03 MED ORDER — LANTHANUM CARBONATE 500 MG PO CHEW
1000.0000 mg | CHEWABLE_TABLET | Freq: Three times a day (TID) | ORAL | Status: DC
  Administered 2015-12-03: 1000 mg via ORAL
  Filled 2015-12-03: qty 2

## 2015-12-03 MED ORDER — HYDROCOD POLST-CPM POLST ER 10-8 MG/5ML PO SUER
5.0000 mL | Freq: Once | ORAL | Status: AC
  Administered 2015-12-03: 5 mL via ORAL
  Filled 2015-12-03: qty 5

## 2015-12-03 MED ORDER — DIPHENHYDRAMINE HCL 25 MG PO CAPS: 25.0000 mg | ORAL_CAPSULE | Freq: Three times a day (TID) | ORAL | Status: DC | PRN

## 2015-12-03 MED ORDER — ALBUTEROL SULFATE (2.5 MG/3ML) 0.083% IN NEBU
5.0000 mg | INHALATION_SOLUTION | Freq: Once | RESPIRATORY_TRACT | Status: AC
  Administered 2015-12-03: 5 mg via RESPIRATORY_TRACT
  Filled 2015-12-03: qty 6

## 2015-12-03 MED ORDER — CALCITRIOL 0.5 MCG PO CAPS
0.7500 ug | ORAL_CAPSULE | ORAL | Status: DC
  Filled 2015-12-03: qty 1

## 2015-12-03 MED ORDER — PENTAFLUOROPROP-TETRAFLUOROETH EX AERO: 1.0000 "application " | INHALATION_SPRAY | CUTANEOUS | Status: DC | PRN

## 2015-12-03 MED ORDER — CEPHALEXIN 250 MG PO CAPS
500.0000 mg | ORAL_CAPSULE | Freq: Once | ORAL | Status: AC
  Administered 2015-12-03: 500 mg via ORAL
  Filled 2015-12-03: qty 2

## 2015-12-03 MED ORDER — SODIUM CHLORIDE 0.9 % IV SOLN: 100.0000 mL | INTRAVENOUS | Status: DC | PRN

## 2015-12-03 MED ORDER — HEPARIN SODIUM (PORCINE) 1000 UNIT/ML DIALYSIS
1000.0000 [IU] | INTRAMUSCULAR | Status: DC | PRN
  Filled 2015-12-03: qty 1

## 2015-12-03 MED ORDER — LIDOCAINE HCL (PF) 1 % IJ SOLN: 5.0000 mL | INTRAMUSCULAR | Status: DC | PRN

## 2015-12-03 MED ORDER — ALTEPLASE 2 MG IJ SOLR
2.0000 mg | Freq: Once | INTRAMUSCULAR | Status: DC | PRN
  Filled 2015-12-03: qty 2

## 2015-12-03 MED ORDER — SODIUM CHLORIDE 0.9 % IV SOLN: 125.0000 mg | INTRAVENOUS | Status: DC

## 2015-12-03 MED ORDER — PANTOPRAZOLE SODIUM 40 MG PO TBEC
40.0000 mg | DELAYED_RELEASE_TABLET | Freq: Every day | ORAL | Status: DC
  Administered 2015-12-03: 40 mg via ORAL
  Filled 2015-12-03: qty 1

## 2015-12-03 MED ORDER — HEPARIN SODIUM (PORCINE) 1000 UNIT/ML DIALYSIS
20.0000 [IU]/kg | INTRAMUSCULAR | Status: DC | PRN
  Filled 2015-12-03: qty 2

## 2015-12-03 MED ORDER — FLUTICASONE PROPIONATE 50 MCG/ACT NA SUSP: 1.0000 | Freq: Every day | NASAL | Status: DC | PRN

## 2015-12-03 MED ORDER — ASPIRIN 81 MG PO CHEW
81.0000 mg | CHEWABLE_TABLET | Freq: Every day | ORAL | Status: DC
  Administered 2015-12-03: 81 mg via ORAL
  Filled 2015-12-03: qty 1

## 2015-12-03 MED ORDER — HYDROCOD POLST-CPM POLST ER 10-8 MG/5ML PO SUER
5.0000 mL | Freq: Two times a day (BID) | ORAL | Status: DC | PRN
  Administered 2015-12-03 (×2): 5 mL via ORAL
  Filled 2015-12-03 (×2): qty 5

## 2015-12-03 MED ORDER — ACETAMINOPHEN 650 MG RE SUPP: 650.0000 mg | Freq: Four times a day (QID) | RECTAL | Status: DC | PRN

## 2015-12-03 MED ORDER — HEPARIN SODIUM (PORCINE) 5000 UNIT/ML IJ SOLN
5000.0000 [IU] | Freq: Three times a day (TID) | INTRAMUSCULAR | Status: DC
  Administered 2015-12-03: 5000 [IU] via SUBCUTANEOUS
  Filled 2015-12-03 (×2): qty 1

## 2015-12-03 MED ORDER — ACETAMINOPHEN 325 MG PO TABS: 650.0000 mg | ORAL_TABLET | Freq: Four times a day (QID) | ORAL | Status: DC | PRN

## 2015-12-03 MED ORDER — ISOSORBIDE MONONITRATE 10 MG PO TABS
15.0000 mg | ORAL_TABLET | Freq: Every day | ORAL | Status: DC
  Filled 2015-12-03 (×2): qty 1.5

## 2015-12-03 MED ORDER — ISOSORBIDE MONONITRATE 20 MG PO TABS
20.0000 mg | ORAL_TABLET | Freq: Every day | ORAL | Status: DC
  Filled 2015-12-03: qty 1

## 2015-12-03 MED ORDER — LIDOCAINE-PRILOCAINE 2.5-2.5 % EX CREA
1.0000 "application " | TOPICAL_CREAM | CUTANEOUS | Status: DC | PRN
  Filled 2015-12-03: qty 5

## 2015-12-03 MED ORDER — RENA-VITE PO TABS: 1.0000 | ORAL_TABLET | Freq: Every day | ORAL | Status: DC

## 2015-12-03 NOTE — Discharge Instructions (Signed)
It has been a pleasure taking care of you! You were admitted due to anaphylactic reaction, which was likely from the new eye driop. We have treated your with steroid and other medications and your symptoms improved.  We are discharging you on your home medications. We may have made some adjustments to your other medications. Please, make sure to read the directions before you take them. The names and directions on how to take these medications are found on this discharge paper under medication section.  You also need a follow up with your primary care doctor. The address, date and time are found on the discharge paper under follow up section.  Below, you can find some reading about anaphylactic reaction  Take care,   Anaphylactic Reaction An anaphylactic reaction is a sudden, severe allergic reaction that involves the whole body. It can be life threatening. A hospital stay is often required. People with asthma, eczema, or hay fever are slightly more likely to have an anaphylactic reaction. CAUSES  An anaphylactic reaction may be caused by anything to which you are allergic. After being exposed to the allergic substance, your immune system becomes sensitized to it. When you are exposed to that allergic substance again, an allergic reaction can occur. Common causes of an anaphylactic reaction include:  Medicines.  Foods, especially peanuts, wheat, shellfish, milk, and eggs.  Insect bites or stings.  Blood products.  Chemicals, such as dyes, latex, and contrast material used for imaging tests. SYMPTOMS  When an allergic reaction occurs, the body releases histamine and other substances. These substances cause symptoms such as tightening of the airway. Symptoms often develop within seconds or minutes of exposure. Symptoms may include:  Skin rash or hives.  Itching.  Chest tightness.  Swelling of the eyes, tongue, or lips.  Trouble breathing or swallowing.  Lightheadedness or  fainting.  Anxiety or confusion.  Stomach pains, vomiting, or diarrhea.  Nasal congestion.  A fast or irregular heartbeat (palpitations). DIAGNOSIS  Diagnosis is based on your history of recent exposure to allergic substances, your symptoms, and a physical exam. Your caregiver may also perform blood or urine tests to confirm the diagnosis. TREATMENT  Epinephrine medicine is the main treatment for an anaphylactic reaction. Other medicines that may be used for treatment include antihistamines, steroids, and albuterol. In severe cases, fluids and medicine to support blood pressure may be given through an intravenous line (IV). Even if you improve after treatment, you need to be observed to make sure your condition does not get worse. This may require a stay in the hospital. HOME CARE INSTRUCTIONS   Wear a medical alert bracelet or necklace stating your allergy.  You and your family must learn how to use an anaphylaxis kit or give an epinephrine injection to temporarily treat an emergency allergic reaction. Always carry your epinephrine injection or anaphylaxis kit with you. This can be lifesaving if you have a severe reaction.  Do not drive or perform tasks after treatment until the medicines used to treat your reaction have worn off, or until your caregiver says it is okay.  If you have hives or a rash:  Take medicines as directed by your caregiver.  You may use an over-the-counter antihistamine (diphenhydramine) as needed.  Apply cold compresses to the skin or take baths in cool water. Avoid hot baths or showers. SEEK MEDICAL CARE IF:   You develop symptoms of an allergic reaction to a new substance. Symptoms may start right away or minutes later.  You  develop a rash, hives, or itching.  You develop new symptoms. SEEK IMMEDIATE MEDICAL CARE IF:   You have swelling of the mouth, difficulty breathing, or wheezing.  You have a tight feeling in your chest or throat.  You develop  hives, swelling, or itching all over your body.  You develop severe vomiting or diarrhea.  You feel faint or pass out. This is an emergency. Use your epinephrine injection or anaphylaxis kit as you have been instructed. Call your local emergency services (911 in [place] Even if you improve after the injection, you need to be examined at a hospital emergency department. MAKE SURE YOU:   Understand these instructions.  Will watch your condition.  Will get help right away if you are not doing well or get worse.   This information is not intended to replace advice given to you by your health care provider. Make sure you discuss any questions you have with your health care provider.   Document Released: 11/02/2005 Document Revised: 11/07/2013 Document Reviewed: 05/15/2015 Elsevier Interactive Patient Education [employer].

## 2015-12-03 NOTE — Procedures (Signed)
Tolerating hemodialysis.  No hemodynamic issues. [name] [name] C

## 2015-12-03 NOTE — Care Management Note (Signed)
Case Management Note  Patient Details  Name: [name] [name] MRN: [id number] Date of Birth: 1951-11-13  Subjective/Objective:    Patient admitted with hypoxia and angioedema. Pt is ESRD and has treatments T,TH, Sat. She is from home with her sister.                 Action/Plan: CM following for discharge needs.   Expected Discharge Date:  12/04/15               Expected Discharge Plan:     In-House Referral:     Discharge planning Services     Post Acute Care Choice:    Choice offered to:     DME Arranged:    DME Agency:     HH Arranged:    HH Agency:     Status of Service:  In process, will continue to follow  Medicare Important Message Given:    Date Medicare IM Given:    Medicare IM give by:    Date Additional Medicare IM Given:    Additional Medicare Important Message give by:     If discussed at [place] of Stay Meetings, dates discussed:    Additional Comments:  Pollie Friar, RN 12/03/2015, 4:24 PM

## 2015-12-03 NOTE — Progress Notes (Signed)
Patient discharged home to self care accompanied by her brother [name]. Condition at discharge is stable.

## 2015-12-03 NOTE — Progress Notes (Signed)
Family Medicine Teaching Service Daily Progress Note Intern Pager: [phone]  Patient name: [name] [name] Medical record number: [id number] Date of birth: 01/25/1951 Age: 65 y.o. Gender: female  Primary Care Provider: Smiley Houseman, MD Consultants: nephrology, card Code Status: DNI  Pt Overview and Major Events to Date:  1/17-admitted with hypoxia, cellulitis and angioedema  Assessment and Plan: [name] [name] is a 65 y.o. female presenting with hypoxia . PMH is significant for CAD, Cardiomyopathy EF 35%, DM2, ESRD on HD  # Hypoxia: resolved. Likely anaphylactic reaction. No tongue swelling. Sating well on room air this morning. No shortness of breath or chest pain. ED reports desaturations into 80's on RA. Afebrile, no leukocytosis, CXR negative. No evidence of fluid overload on exam. BNP >4500 (4000 six months ago). Unclear the significance of this in the setting of ESRD. EKG sinus with borderline PR but no ischemic changes. H/o ESRD, CAD, Cardiomyopathy EF 35% (04/2015). - Unlikely to be ACS but card aware that she is here.  - VS per floor protocol - O2 prn saturations < 92% - Albuterol nebs prn - Troponins 0.06> (b/l) - Suspect that will hypoxia will improve with HD tomorrow.  # Anaphylaxis with hypoxia and facial Angioedema: resolved. Likely secondary to the eye drop she use 4 days ago as there is a temporal association. S/p treatment with SoluMedrol, Benadryl and Pepcid in ED. Protecting airway.CBC remarkable only for Hgb of 10.8 likely ACD. MCV 98 - Monitor - Discontinue cephalexin given no sign of infecton - Benadryl prn, pepcid - Less suspicion for relapse  # ESRD on HD: T, Th, S HD sessions. - renal consulted  - Continue home renal vitamins  # DM2: A1c 07/2015 5.4. CBG in 150's. Diet controlled - Carb mod/ HH diet  # CAD/ Ischemic cardiomyopathy: LVEF 35% in June 2016. 3 vessel coronary disease. No anginal symptoms. Trop 0.06 (at baseline). Patient was  placed on medical management at that time. EKG on admission non ischemic - f/u trop - card aware - Continue Statin, ASA, Imdur  FEN/GI: SLIV, Pepcid Prophylaxis: sub-q heparin  Disposition: home after HD Subjective:  Denies shortness of breath & chest pain. Says her symptoms (facial swelling and tongue swelling) have improved after medications she received in ED.   Objective: Temp:  [98 F (36.7 C)-98.8 F (37.1 C)] 98.4 F (36.9 C) (01/17 0500) Pulse Rate:  [71-99] 78 (01/17 0500) Resp:  [18-29] 22 (01/17 0500) BP: (118-160)/(68-104) 141/83 mmHg (01/17 0500) SpO2:  [86 %-99 %] 97 % (01/17 0500) Weight:  [183 lb 3.2 oz (83.099 kg)-189 lb 9.5 oz (86 kg)] 189 lb 9.5 oz (86 kg) (01/17 0500) Physical Exam: Gen: appears well, no acute distress Eyes: pupils equal, round and mildly reactive to light (has cataract), no redness of sclera, erythema of the eye lid  Nares: clear, no erythema, swelling or congestion Oropharynx: clear, moist, no tongue swelling CV: regular rate and rythm. S1 & S2 audible, no murmurs. No ext edema, no JVD Resp: no apparent work of breathing, clear to auscultation bilaterally. GI: bowel sounds normal, no tenderness to palpation, no rebound or guarding, no mass.  Skin: viteligo Laboratory:  Recent Labs Lab 12/02/15 2328  WBC 5.6  HGB 10.8*  HCT 34.1*  PLT 213    Recent Labs Lab 12/02/15 2328  NA 134*  K 3.9  CL 94*  CO2 26  BUN 24*  CREATININE 4.81*  CALCIUM 8.8*  PROT 6.1*  BILITOT 1.2  ALKPHOS 85  ALT 10*  AST 23  GLUCOSE 109*    Imaging/Diagnostic Tests: Dg Chest 2 View  12/02/2015  CLINICAL DATA:  65 year old female with cough and shortness of breath EXAM: CHEST  2 VIEW COMPARISON:  Radiograph dated 12/16/2014 FINDINGS: Two views of the chest do not demonstrate a focal consolidation. There is left lung base subsegmental atelectatic changes versus scarring. There is no pleural effusion or pneumothorax. Stable cardiac silhouette. The  osseous structures appear unremarkable. IMPRESSION: No focal consolidation or pneumothorax. Electronically Signed   By: Anner Crete M.D.   On: 12/02/2015 22:47    Mercy Riding, MD 12/03/2015, 8:07 AM PGY-1, [place] Intern pager: [phone], text pages welcome

## 2015-12-03 NOTE — Progress Notes (Signed)
Patient received into 5C13 from the ED. Verbal and oriented on arrival. Came with her personal walker that is kept at the bedside. Oriented to room and safety measures in placed. Call bell placed within and will keep monitoring.

## 2015-12-03 NOTE — Discharge Summary (Signed)
[facility] Discharge Summary  Patient name: [name] [name] Medical record number: [id number] Date of birth: Apr 10, 1951 Age: 65 y.o. Gender: female Date of Admission: 12/02/2015  Date of Discharge: 12/03/2015 Admitting Physician: Lupita Dawn, MD  Primary Care Provider: Smiley Houseman, MD Consultants: nephrology  Indication for Hospitalization: facial swelling and shortness of breath  Discharge Diagnoses/Problem List:  Patient Active Problem List   Diagnosis Date Noted  . Hypoxia 12/03/2015  . Facial cellulitis 12/03/2015  . Facial swelling   . Angioedema 12/02/2015  . Hyperlipidemia   . Diabetes mellitus type 2 in obese ([place]) 05/07/2015  . CAD in native artery 04/25/2015  . Loss of consciousness 04/23/2015  . Syncope 04/23/2015  . Fall   . Moderate nonproliferative diabetic retinopathy(362.05) 07/16/2014  . Other malaise and fatigue 06/12/2014  . Memory loss 06/12/2014  . Right knee pain 02/22/2014  . Trouble in sleeping 02/22/2014  . End stage renal disease ([place]) 03/16/2013  . Aftercare following surgery of the circulatory system, [place] 03/16/2013  . Nausea and vomiting 02/14/2013  . Anemia 02/14/2013  . Depression 08/28/2011  . Constipation 04/18/2011  . Low blood pressure 04/18/2011    Disposition: home  Discharge Condition: stable  Discharge Exam:  Temp: [98 F (36.7 C)-98.8 F (37.1 C)] 98.4 F (36.9 C) (01/17 0500) Pulse Rate: [71-99] 78 (01/17 0500) Resp: [18-29] 22 (01/17 0500) BP: (118-160)/(68-104) 141/83 mmHg (01/17 0500) SpO2: [86 %-99 %] 97 % (01/17 0500) Weight: [183 lb 3.2 oz (83.099 kg)-189 lb 9.5 oz (86 kg)] 189 lb 9.5 oz (86 kg) (01/17 0500) Physical Exam: Gen: appears well, no acute distress Eyes: pupils equal, round and mildly reactive to light (has cataract), no redness of sclera, erythema of the eye lid  Nares: clear, no erythema, swelling or congestion Oropharynx: clear, moist, no tongue  swelling CV: regular rate and rythm. S1 & S2 audible, no murmurs. No ext edema, no JVD Resp: no apparent work of breathing, clear to auscultation bilaterally. GI: bowel sounds normal, no tenderness to palpation, no rebound or guarding, no mass.  Skin: vitiligo  Brief Hospital Course:  [name] [name] is a 65 y.o. female with PMH CAD, Cardiomyopathy with EF 35%, Diet controlled type 2DM, ESRD on HD T/Th/Sat. Patient admitted for left eye/facial swelling after starting new eye drop at home (patient does not know name of medication).  Anaphylaxis with hypoxia & facial swelling: resolved. Likely secondary to the eye drop she used 4 days prior to admission as there is a temporal association. She received SoluMedrol, Benadryl and Pepcid in ED. An hour later, desated to 86% on room air, and started on oxygen by nasal canula. Her saturation came back up to 99% and stayed stable.  On floor patient maintained saturations in upper 90's. She was weaned off oxygen to room air in the morning. She had no dyspnea or chest pain. She was able to protect her airway. The chance of relapse was thought to be very low as patient's last use of the eye drop was almost 5 days out at time of discharge. On discharge, she was instructed to avoid the eye drop that has likely caused her facial swelling and hypoxia. Return precautions were also discussed. Given she is   Patient was also started on keflex while in ED out of concern for facial cellulitis. However, it was discontinued the follow morning as there was no sign of infection on exam. CBC was without leukocytosis. CXR negative for infiltration or pulmonary  edema.  ESRD on HD: on HD  T, Th, Sa sessions. Received her HD before discharge  CAD/ Ischemic cardiomyopathy: 3 vessel coronary disease. LVEF 35% in June 2016. Marland Kitchen No anginal symptoms.Trop 0.06 (at baseline).EKG NSR with borderline PR but no ischemic changes. Cardiology was contacted for FYI given her 3 vessel disease  although her presentation is less likely for ACS. She received Statin, ASA and Imdur.  Issues for Follow Up:  1. Hypoxia: resolved on discharge. 2. CAD: she got three vessels disease. Being followed by cardiology.  Significant Procedures: none  Significant Labs and Imaging:   Recent Labs Lab 12/02/15 2328 12/03/15 1230  WBC 5.6 3.9*  HGB 10.8* 10.4*  HCT 34.1* 32.1*  PLT 213 199    Recent Labs Lab 12/02/15 2328 12/03/15 1230  NA 134* 132*  K 3.9 4.2  CL 94* 91*  CO2 26 24  GLUCOSE 109* 209*  BUN 24* 31*  CREATININE 4.81* 5.23*  CALCIUM 8.8* 8.9  PHOS  --  4.8*  ALKPHOS 85  --   AST 23  --   ALT 10*  --   ALBUMIN 3.0* 2.9*    Results/Tests Pending at Time of Discharge: none  Discharge Medications:    Medication List    STOP taking these medications        PROLENSA 0.07 % Soln  Generic drug:  Bromfenac Sodium      TAKE these medications        acetaminophen 500 MG tablet  Commonly known as:  TYLENOL  Take 500 mg by mouth every 6 (six) hours as needed for headache.     aspirin 81 MG tablet  Take 81 mg by mouth daily.     atorvastatin 40 MG tablet  Commonly known as:  LIPITOR  Take 40 mg by mouth every morning.     calcium carbonate 1250 (500 Ca) MG tablet  Commonly known as:  OS-CAL - dosed in mg of elemental calcium  Take two (2) tablets by mouth three (3) times daily with meals and take two (2) tablets by mouth with snacks.     docusate sodium 100 MG capsule  Commonly known as:  COLACE  Take 100 mg by mouth daily as needed for mild constipation or moderate constipation.     fluticasone 50 MCG/ACT nasal spray  Commonly known as:  FLONASE  Place 1 spray into both nostrils daily as needed for allergies or rhinitis.     isosorbide mononitrate 10 MG tablet  Commonly known as:  ISMO,MONOKET  Take 1.5 tablets (15 mg total) by mouth daily.     lanthanum 1000 MG chewable tablet  Commonly known as:  FOSRENOL  Chew 1,000 mg by mouth 3 (three)  times daily with meals.     multivitamin Tabs tablet  Take 1 tablet by mouth at bedtime.     ondansetron 4 MG tablet  Commonly known as:  ZOFRAN  Take 4 mg by mouth every 6 (six) hours as needed for nausea or vomiting.     pantoprazole 40 MG tablet  Commonly known as:  PROTONIX  TAKE 1 TABLET BY MOUTH DAILY     promethazine 25 MG tablet  Commonly known as:  PHENERGAN  Take 25 mg by mouth every 6 (six) hours as needed for nausea or vomiting.        Discharge Instructions: Please refer to Patient Instructions section of EMR for full details.  Patient was counseled important signs and symptoms that should prompt return  to medical care, changes in medications, dietary instructions, activity restrictions, and follow up appointments.   Follow-Up Appointments:     Follow-up Information    Follow up with Tawanna Sat, MD On 12/12/2015.   Specialty:  Family Medicine   Why:  at 10 am for hospital follow up   Contact information:   [place] [id number] [phone]       Mercy Riding, MD 12/03/2015, 5:12 PM PGY-1, [place]

## 2015-12-03 NOTE — Consult Note (Signed)
[facility] Renal Consultation Note    Indication for Consultation:  Management of ESRD/hemodialysis; anemia, hypertension/volume and secondary hyperparathyroidism PCP:  HPI: [name] [name] is a 65 y.o. female with ESRD on TTS HD at [facility] presented upon transfer from her PCP office yesterday with cough and SOB but no CP.  She had been started on new (unknown name) eye drops by her eye MD last week that caused eye and facial swelling and was seeing her PCP for this.  Yesterday her tongue felt very thick and large but it is fine today.  She has been having cough over the past few months and lately is not able to lie down due to SOB.  She has significant LE edema and has had serially lowering of EDW at her HD unit BP have not been signficantly high.  She tells me today she is supposed to be having a CABG at some point. (though I don't see that anywhere in EPIC). EKG showed no acute change. CXR no significant volume. First troponin 0.06.  She is due for dialysis today.    Past Medical History  Diagnosis Date  . Hearing difficulty     Mild   . Diabetes mellitus ([place])   . Coronary artery disease     a. cath 6/16 Severe multivessel cad with diffuse significant segmental LAD stenosis, 95% stenosis in a large second diagonal, 75% ostial stenosis in the smaller first diagonal, 95% mid and 65% distal circumflex, 95% ostial/proximal PDA, and 40-50% left main ostial disease. Med rx initially, to be re-evaluated by CVTS.  Marland Kitchen ESRD (end stage renal disease) on dialysis ([place])        . Hypertension   . Ischemic cardiomyopathy     a. Echo 04/2015: EF 35%. Cath same time - 45%.  . Syncope   . Hyponatremia   . Anemia   . Thrombocytopenia ([place])   . Memory loss   . Hyperlipidemia    Past Surgical History  Procedure Laterality Date  . 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 Esophagogastroduodenoscopy Left 02/20/2013    Procedure: ESOPHAGOGASTRODUODENOSCOPY (EGD);  Surgeon:  Cleotis Nipper, MD;  Location: [facility] ENDOSCOPY;  Service: Endoscopy;  Laterality: Left;  . Av fistula placement Left 03/07/2013    Procedure: ARTERIOVENOUS (AV) FISTULA CREATION;  Surgeon: Elam Dutch, MD;  Location: [facility] OR;  Service: Vascular;  Laterality: Left;  left brachiocephalic AVF  . Cardiac catheterization N/A 04/25/2015    Procedure: Left Heart Cath and Coronary Angiography;  Surgeon: Belva Crome, MD;  Location: [facility] CV LAB;  Service: Cardiovascular;  Laterality: N/A;   Family History  Problem Relation Age of Onset  . Heart disease    . Diabetes    . Stroke    . Bone cancer Father     Of jaws   . Throat cancer Father    Social History:  reports that she has never smoked. She has never used smokeless tobacco. She reports that she does not drink alcohol or use illicit drugs. Allergies  Allergen Reactions  . Aleve [Naproxen Sodium] Other (See Comments)    Due to kidneys.  . Motrin [Ibuprofen] Other (See Comments)    Due to kidneys.  . Nsaids Other (See Comments)    KIDNEY FAILURE AND ONLY HAS PARTIAL LEFT KIDNEY REMAINING (RIGHT ONE TAKEN OUT)   Prior to Admission medications   Medication Sig Start Date End Date Taking? Authorizing Provider  acetaminophen (TYLENOL) 500 MG  tablet Take 500 mg by mouth every 6 (six) hours as needed for headache.   Yes Historical Provider, MD  aspirin 81 MG tablet Take 81 mg by mouth daily.   Yes Historical Provider, MD  atorvastatin (LIPITOR) 40 MG tablet Take 40 mg by mouth every morning.   Yes Historical Provider, MD  Bromfenac Sodium (PROLENSA) 0.07 % SOLN Place 1 drop into the left eye daily.   Yes Historical Provider, MD  calcium carbonate (OS-CAL - DOSED IN MG OF ELEMENTAL CALCIUM) 1250 (500 CA) MG tablet Take two (2) tablets by mouth three (3) times daily with meals and take two (2) tablets by mouth with snacks.   Yes Historical Provider, MD  docusate sodium (COLACE) 100 MG capsule Take 100 mg by mouth daily as needed for mild  constipation or moderate constipation.   Yes Historical Provider, MD  fluticasone (FLONASE) 50 MCG/ACT nasal spray Place 1 spray into both nostrils daily as needed for allergies or rhinitis.   Yes Historical Provider, MD  isosorbide mononitrate (ISMO,MONOKET) 10 MG tablet Take 1.5 tablets (15 mg total) by mouth daily. 09/25/15  Yes Belva Crome, MD  lanthanum (FOSRENOL) 1000 MG chewable tablet Chew 1,000 mg by mouth 3 (three) times daily with meals.   Yes Historical Provider, MD  multivitamin (RENA-VIT) TABS tablet Take 1 tablet by mouth at bedtime. 09/04/14  Yes Leone Haven, MD  ondansetron (ZOFRAN) 4 MG tablet Take 4 mg by mouth every 6 (six) hours as needed for nausea or vomiting.   Yes Historical Provider, MD  pantoprazole (PROTONIX) 40 MG tablet TAKE 1 TABLET BY MOUTH DAILY 11/26/15  Yes Smiley Houseman, MD  promethazine (PHENERGAN) 25 MG tablet Take 25 mg by mouth every 6 (six) hours as needed for nausea or vomiting.   Yes Historical Provider, MD   Current Facility-Administered Medications  Medication Dose Route Frequency Provider Last Rate Last Dose  . acetaminophen (TYLENOL) tablet 650 mg  650 mg Oral Q6H PRN Janora Norlander, DO       Or  . acetaminophen (TYLENOL) suppository 650 mg  650 mg Rectal Q6H PRN Janora Norlander, DO      . aspirin chewable tablet 81 mg  81 mg Oral Daily Ashly M Gottschalk, DO   81 mg at 12/03/15 0913  . atorvastatin (LIPITOR) tablet 40 mg  40 mg Oral q morning - 10a Ashly M Gottschalk, DO   40 mg at 12/03/15 0913  . calcitRIOL (ROCALTROL) capsule 0.75 mcg  0.75 mcg Oral Q T,Th,Sa-HD Alric Seton, PA-C      . chlorpheniramine-HYDROcodone (TUSSIONEX) 10-8 MG/5ML suspension 5 mL  5 mL Oral Q12H PRN Janora Norlander, DO   5 mL at 12/03/15 0806  . diphenhydrAMINE (BENADRYL) capsule 25 mg  25 mg Oral Q8H PRN Janora Norlander, DO      . [START ON 12/05/2015] ferric gluconate (NULECIT) 125 mg in sodium chloride 0.9 % 100 mL IVPB  125 mg Intravenous Q  Thu-HD Alric Seton, PA-C      . fluticasone (FLONASE) 50 MCG/ACT nasal spray 1 spray  1 spray Each Nare Daily PRN Janora Norlander, DO      . heparin injection 5,000 Units  5,000 Units Subcutaneous 3 times per day Janora Norlander, DO   5,000 Units at 12/03/15 0544  . isosorbide mononitrate (ISMO,MONOKET) tablet 20 mg  20 mg Oral Daily Mercy Riding, MD      . lanthanum (FOSRENOL) chewable tablet 1,000 mg  1,000 mg Oral TID WC Ashly M Gottschalk, DO   1,000 mg at 12/03/15 0900  . multivitamin (RENA-VIT) tablet 1 tablet  1 tablet Oral QHS Ashly M Gottschalk, DO      . pantoprazole (PROTONIX) EC tablet 40 mg  40 mg Oral Daily Ashly M Gottschalk, DO   40 mg at 12/03/15 0913   Labs: Basic Metabolic Panel:  Recent Labs Lab 12/02/15 2328  NA 134*  K 3.9  CL 94*  CO2 26  GLUCOSE 109*  BUN 24*  CREATININE 4.81*  CALCIUM 8.8*   Liver Function Tests:  Recent Labs Lab 12/02/15 2328  AST 23  ALT 10*  ALKPHOS 85  BILITOT 1.2  PROT 6.1*  ALBUMIN 3.0*   CBC:  Recent Labs Lab 12/02/15 2328  WBC 5.6  NEUTROABS 3.9  HGB 10.8*  HCT 34.1*  MCV 98.6  PLT 213   Cardiac Enzymes:  Recent Labs Lab 12/02/15 2328  TROPONINI 0.06*   CBG:  Recent Labs Lab 12/03/15 0725  GLUCAP 156*   Studies/Results: Dg Chest 2 View  12/02/2015  CLINICAL DATA:  65 year old female with cough and shortness of breath EXAM: CHEST  2 VIEW COMPARISON:  Radiograph dated 12/16/2014 FINDINGS: Two views of the chest do not demonstrate a focal consolidation. There is left lung base subsegmental atelectatic changes versus scarring. There is no pleural effusion or pneumothorax. Stable cardiac silhouette. The osseous structures appear unremarkable. IMPRESSION: No focal consolidation or pneumothorax. Electronically Signed   By: Anner Crete M.D.   On: 12/02/2015 22:47    ROS: As per HPI. She also has intermittent diarrhea and constipation. Physical Exam: Filed Vitals:   12/03/15 0143 12/03/15 0200  12/03/15 0233 12/03/15 0500  BP:  130/68 119/77 141/83  Pulse:  93 98 78  Temp:   98.8 F (37.1 C) 98.4 F (36.9 C)  TempSrc:   Oral Oral  Resp:  29 20 22   Height:    5\' 5"  (1.651 m)  Weight:    86 kg (189 lb 9.5 oz)  SpO2: 95% 98% 99% 97%     General: Well developed, obese chronically ill appearing WF  in no acute distress. Head: Normocephalic, atraumatic, sclera non-icteric, mucus membranes are moist HOH (left hearing aide home), exothalmos, tongue coated but not enlarged Neck: Supple. JVD not elevated. Lungs: grossly clear but can only take shallow breaths without coughing Heart: RRR with S1 S2. No murmurs, rubs, or gallops appreciated. Abdomen: Soft obese non-tender, non-distended with normoactive bowel sounds.  Lower extremities: +++ edema on right + on left no ischemic changes, no open wounds  Neuro: Alert and oriented X 3. Moves all extremities spontaneously. Psych:  Responds to questions appropriately with a normal affect. Dialysis Access: left upper AVF + bruit, aneurysmal  Dialysis Orders: Center: [facility] TTS 4hr 10 425/800 heparin 2600 2 K 2 Ca EDW81.5 - being lowered calcitriol 0.75 venofer 50 per week Mircera 75 q2 weeks last 1/12  Assessment/Plan: 1. Facial swelling, angioedema - greatly improved 2. Hypoxia - said she had to wear O2 last night ; not wearing any at present; sats in [facility] OK but suspect she was on HD then 3. ESRD -  TTS HD today K 3.9 -use 4 K bath today; lower edw- not sure if 86 is true- if so she is significantly above EDW - get standing wt pre HD 4. Hypertension/volume  - excess volume- titrating EDW down as BP allows 5. Anemia  - Hgb 10.8 - on weekly Fe -  due for redose 1/26 6. Metabolic bone disease -  Continue calcitriol 7. Nutrition?obesity- renal carb mod diet/vit 8. CAD- followed by Dr. Tamala Julian, His note of 11/9 was to refer her back to Dr. Nils Pyle but hasn't seen him yet; serial troponins - initial 0.06, asa, statin 9. DM - per primary- off  meds 10. Disp - to return home - lives with sister.  Myriam Jacobson, PA-C [facility] Beeper [phone] 12/03/2015, 11:34 AM   12/03/15 Renal Attending:  Hypoxia in setting of allergic rxn manifest as angioedema, now improved. Will get back on HD schedule today. Ismelda Weatherman C

## 2015-12-03 NOTE — H&P (Signed)
[facility] Admission History and Physical Service Pager: [phone]  Patient name: [name] [name] Medical record number: [id number] Date of birth: 05-24-1951 Age: 65 y.o. Gender: female  Primary Care Provider: Smiley Houseman, MD Consultants: none Code Status: DNI (discussed on admission)  Chief Complaint: hypoxia  Assessment and Plan: [name] [name] is a 65 y.o. female presenting with hypoxia . PMH is significant for CAD, Cardiomyopathy EF 35%, DM2, ESRD on HD  # Hypoxia: EDP reports desaturations into 80's on RA.  Afebrile, no leukocytosis, CXR negative.  No evidence of fluid overload on exam but BNP >4500.  EKG sinus with no ischemic changes.  H/o ESRD, CAD, Cardiomyopathy EF 35% (04/2015).  DDx fluid overload vs MI vs pna that has not presented itself yet  - Place in observation under Dr Ree Kida - VS per floor protocol - O2 prn saturations < 92% - Albuterol nebs prn - Troponins and am EKG ordered - Suspect that will hypoxia will improve with HD tomorrow.  # Facial cellulitis/ Angioedema: s/p treatment with SoluMedrol, Benadryl and Pepcid in ED. Protecting airway.  Symptoms seemingly resolved after treatment - Monitor - CBC with diff in am - Continue Cephalexin - Benadryl prn, pepcid - consider continuing steroids if persistent swelling  # ESRD on HD: T, Th, S HD sessions. - c/s renal in am for HD - Continue home renal vitamins  # DM2: A1c 07/2015 5.4. Diet controlled - Carb mod/ HH diet  # CAD/ Ischemic cardiomyopathy: LVEF 35% in June 2016. 3 vessel coronary disease. No anginal symptoms.  Trop 0.06 on admission, which is similar to June of last year when patient was dx with CAD.  Patient was placed on medical management at that time.  EKG on admission non ischemic - Cycle trops - c/s cards in am - Continue Statin, ASA, Imdur  FEN/GI: SLIV, Pepcid Prophylaxis: sub-q heparin  Disposition: Place in observation for hypoxia, may need to  change to inpt if HD does not improve hypoxia.  History of Present Illness:  [name] [name] is a 65 y.o. female presenting with facial swelling found to be hypoxic  Patient presented to ED for facial swelling.  She was seen by her PCP earlier today who recommended that she come to the ED for evaluation.  She was given Solumedrol, Benadryl and pepcid.  She notes that her symptoms improved after these medications.  Denies difficulty swallowing, SOB outside of baseline, CP, nausea, vomiting.  She reports a 4 month h/o cough.  She has tried several OTCs with little relief.  She denies fevers, rhinorrhea.  She thinks it may be associated with acid reflux.  No abdominal pain.  Patient notes that she does not feel SOB right now.  Though does note dyspnea with exertion.  No edema.  Does not use O2 at home.  Additionally, lives with sister.  Husband recently deceased.  Performs ADLs independently.  Uses walker for ambulation.  No Tobacco, drug or ETOH use.  Review Of Systems: Per HPI with the following additions: none Otherwise the remainder of the systems were negative.  Patient Active Problem List   Diagnosis Date Noted  . Angioedema 12/02/2015  . Hyperlipidemia   . Diabetes mellitus type 2 in obese ([place]) 05/07/2015  . CAD in native artery 04/25/2015  . Loss of consciousness 04/23/2015  . Syncope 04/23/2015  . Fall   . Moderate nonproliferative diabetic retinopathy(362.05) 07/16/2014  . Other malaise and fatigue 06/12/2014  . Memory loss 06/12/2014  .  Right knee pain 02/22/2014  . Trouble in sleeping 02/22/2014  . End stage renal disease ([place]) 03/16/2013  . Aftercare following surgery of the circulatory system, [place] 03/16/2013  . Nausea and vomiting 02/14/2013  . Anemia 02/14/2013  . Depression 08/28/2011  . Constipation 04/18/2011  . Low blood pressure 04/18/2011    Past Medical History: Past Medical History  Diagnosis Date  . Hearing difficulty     Mild   . Diabetes mellitus ([place])    . Coronary artery disease     a. cath 6/16 Severe multivessel cad with diffuse significant segmental LAD stenosis, 95% stenosis in a large second diagonal, 75% ostial stenosis in the smaller first diagonal, 95% mid and 65% distal circumflex, 95% ostial/proximal PDA, and 40-50% left main ostial disease. Med rx initially, to be re-evaluated by CVTS.  Marland Kitchen ESRD (end stage renal disease) on dialysis ([place])        . Hypertension   . Ischemic cardiomyopathy     a. Echo 04/2015: EF 35%. Cath same time - 45%.  . Syncope   . Hyponatremia   . Anemia   . Thrombocytopenia ([place])   . Memory loss   . Hyperlipidemia     Past Surgical History: Past Surgical History  Procedure Laterality Date  . 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 Esophagogastroduodenoscopy Left 02/20/2013    Procedure: ESOPHAGOGASTRODUODENOSCOPY (EGD);  Surgeon: Cleotis Nipper, MD;  Location: [facility] ENDOSCOPY;  Service: Endoscopy;  Laterality: Left;  . [name] fistula placement Left 03/07/2013    Procedure: ARTERIOVENOUS ([name]) FISTULA CREATION;  Surgeon: Elam Dutch, MD;  Location: [facility] OR;  Service: Vascular;  Laterality: Left;  left brachiocephalic AVF  . Cardiac catheterization N/A 04/25/2015    Procedure: Left Heart Cath and Coronary Angiography;  Surgeon: Belva Crome, MD;  Location: [facility] CV LAB;  Service: Cardiovascular;  Laterality: N/A;    Social History: Social History  Substance Use Topics  . Smoking status: Never Smoker   . Smokeless tobacco: Never Used  . Alcohol Use: No   Additional social history: none  Please also refer to relevant sections of EMR.  Family History: Family History  Problem Relation Age of Onset  . Heart disease    . Diabetes    . Stroke    . Bone cancer Father     Of jaws   . Throat cancer Father     Allergies and Medications: Allergies  Allergen Reactions  . Aleve [Naproxen Sodium] Other (See Comments)    Due to kidneys.  . Motrin [Ibuprofen] Other (See  Comments)    Due to kidneys.  . Nsaids Other (See Comments)    KIDNEY FAILURE AND ONLY HAS PARTIAL LEFT KIDNEY REMAINING (RIGHT ONE TAKEN OUT)   No current facility-administered medications on file prior to encounter.   Current Outpatient Prescriptions on File Prior to Encounter  Medication Sig Dispense Refill  . acetaminophen (TYLENOL) 500 MG tablet Take 500 mg by mouth every 6 (six) hours as needed for headache.    Marland Kitchen aspirin 81 MG tablet Take 81 mg by mouth daily.    Marland Kitchen atorvastatin (LIPITOR) 40 MG tablet Take 40 mg by mouth every morning.    . calcium carbonate (OS-CAL - DOSED IN MG OF ELEMENTAL CALCIUM) 1250 (500 CA) MG tablet Take two (2) tablets by mouth three (3) times daily with meals and take two (2) tablets by mouth with snacks.    . cephALEXin (KEFLEX) 250 MG capsule  Take 1 capsule (250 mg total) by mouth 2 (two) times daily. 15 capsule 0  . docusate sodium (COLACE) 100 MG capsule Take 100 mg by mouth daily as needed for mild constipation or moderate constipation.    . fluticasone (FLONASE) 50 MCG/ACT nasal spray Place 1 spray into both nostrils daily as needed for allergies or rhinitis.    Marland Kitchen isosorbide mononitrate (ISMO,MONOKET) 10 MG tablet Take 1.5 tablets (15 mg total) by mouth daily. 135 tablet 1  . lanthanum (FOSRENOL) 1000 MG chewable tablet Chew 1,000 mg by mouth 3 (three) times daily with meals.    . multivitamin (RENA-VIT) TABS tablet Take 1 tablet by mouth at bedtime. 90 tablet 3  . ondansetron (ZOFRAN) 4 MG tablet Take 4 mg by mouth every 6 (six) hours as needed for nausea or vomiting.    . pantoprazole (PROTONIX) 40 MG tablet TAKE 1 TABLET BY MOUTH DAILY 30 tablet 0  . promethazine (PHENERGAN) 25 MG tablet Take 25 mg by mouth every 6 (six) hours as needed for nausea or vomiting.      Objective: BP 150/79 mmHg  Pulse 88  Temp(Src) 98.4 F (36.9 C) (Oral)  Resp 18  Ht 5\' 4"  (1.626 m)  Wt 187 lb (84.823 kg)  BMI 32.08 kg/m2  SpO2 86% Exam: General: awake, alert,  NAD, PharmTech at bedside Eyes: EOMI ENTM: o/p clear, poor dentition, MMM Neck: no JVD noted, supple Cardiovascular: RRR, no obvious murmurs on exam Respiratory: slight expiratory wheeze in lower lung fields, no crackles, air movement fair, normal work of breathing on 2L [place] Abdomen: soft, NT/ND, +BS MSK: no edema Skin: areas of hypopigmentation on LE and UE. No visible skin breakdown.  Mild swelling of eyelids and upper lip. Neuro: follows all commands, decreased hearing (chronic) Psych: mood stable, pleasant, good eye contact  Labs and Imaging: CBC BMET   Recent Labs Lab 12/02/15 2328  WBC 5.6  HGB 10.8*  HCT 34.1*  PLT 213    Recent Labs Lab 12/02/15 2328  NA 134*  K 3.9  CL 94*  CO2 26  BUN 24*  CREATININE 4.81*  GLUCOSE 109*  CALCIUM 8.8*     Cardiac Panel (last 3 results)  Recent Labs  12/02/15 2328  TROPONINI 0.06*   Dg Chest 2 View  12/02/2015  CLINICAL DATA:  65 year old female with cough and shortness of breath EXAM: CHEST  2 VIEW COMPARISON:  Radiograph dated 12/16/2014 FINDINGS: Two views of the chest do not demonstrate a focal consolidation. There is left lung base subsegmental atelectatic changes versus scarring. There is no pleural effusion or pneumothorax. Stable cardiac silhouette. The osseous structures appear unremarkable. IMPRESSION: No focal consolidation or pneumothorax. Electronically Signed   By: Anner Crete M.D.   On: 12/02/2015 22:47   Janora Norlander, DO 12/03/2015, 1:02 AM PGY-2, [place] Intern pager: [phone], text pages welcome

## 2015-12-04 LAB — HEPATITIS B SURFACE ANTIGEN: Hepatitis B Surface Ag: NEGATIVE

## 2015-12-09 ENCOUNTER — Ambulatory Visit (HOSPITAL_COMMUNITY): Payer: Medicare Other | Attending: Internal Medicine

## 2015-12-09 ENCOUNTER — Other Ambulatory Visit: Payer: Self-pay

## 2015-12-09 DIAGNOSIS — I255 Ischemic cardiomyopathy: Secondary | ICD-10-CM | POA: Diagnosis present

## 2015-12-09 DIAGNOSIS — I517 Cardiomegaly: Secondary | ICD-10-CM | POA: Diagnosis not present

## 2015-12-09 DIAGNOSIS — I120 Hypertensive chronic kidney disease with stage 5 chronic kidney disease or end stage renal disease: Secondary | ICD-10-CM | POA: Diagnosis not present

## 2015-12-09 DIAGNOSIS — I351 Nonrheumatic aortic (valve) insufficiency: Secondary | ICD-10-CM | POA: Insufficient documentation

## 2015-12-09 DIAGNOSIS — Z6829 Body mass index (BMI) 29.0-29.9, adult: Secondary | ICD-10-CM | POA: Diagnosis not present

## 2015-12-09 DIAGNOSIS — E119 Type 2 diabetes mellitus without complications: Secondary | ICD-10-CM | POA: Insufficient documentation

## 2015-12-09 DIAGNOSIS — E669 Obesity, unspecified: Secondary | ICD-10-CM | POA: Insufficient documentation

## 2015-12-09 DIAGNOSIS — E785 Hyperlipidemia, unspecified: Secondary | ICD-10-CM | POA: Insufficient documentation

## 2015-12-09 DIAGNOSIS — I340 Nonrheumatic mitral (valve) insufficiency: Secondary | ICD-10-CM | POA: Insufficient documentation

## 2015-12-09 DIAGNOSIS — I071 Rheumatic tricuspid insufficiency: Secondary | ICD-10-CM | POA: Diagnosis not present

## 2015-12-09 DIAGNOSIS — N186 End stage renal disease: Secondary | ICD-10-CM | POA: Insufficient documentation

## 2015-12-11 ENCOUNTER — Ambulatory Visit (INDEPENDENT_AMBULATORY_CARE_PROVIDER_SITE_OTHER): Payer: Medicare Other | Admitting: Family Medicine

## 2015-12-11 ENCOUNTER — Encounter: Payer: Self-pay | Admitting: Family Medicine

## 2015-12-11 VITALS — BP 147/69 | HR 69 | Temp 97.8°F | Ht 64.0 in | Wt 180.0 lb

## 2015-12-11 DIAGNOSIS — R059 Cough, unspecified: Secondary | ICD-10-CM

## 2015-12-11 DIAGNOSIS — R05 Cough: Secondary | ICD-10-CM | POA: Diagnosis not present

## 2015-12-11 DIAGNOSIS — R053 Chronic cough: Secondary | ICD-10-CM | POA: Insufficient documentation

## 2015-12-11 MED ORDER — LORATADINE 10 MG PO TABS
10.0000 mg | ORAL_TABLET | Freq: Every day | ORAL | Status: DC
Start: 2015-12-11 — End: 2016-02-14

## 2015-12-11 MED ORDER — BENZONATATE 200 MG PO CAPS: 200.0000 mg | ORAL_CAPSULE | Freq: Three times a day (TID) | ORAL | Status: DC | PRN

## 2015-12-11 NOTE — Patient Instructions (Signed)
Your cough is probably related to the cold you had. This can take weeks to months to get better. We will give you a cough medication and an allergy medication today.  If your symptoms are not improving please let us know.  Please schedule an appointment with Dr Darnell Level soon for a regular appointment.  Take care,  Dr Jerline Pain

## 2015-12-11 NOTE — Progress Notes (Signed)
Subjective:  [name] [name] is a 65 y.o. female who presents to the [facility] today with a chief complaint of hospital follow up for anaphylactic reaction.   HPI:  Anaphylaxis Patient was admitted to the hospital on 12/02/2015 for anaphylactic reaction to eye drops. She was monitored overnight with no complications. Since being discharged she has had no further symptoms.    Cough Patient also presents with 1 month of persistent cough. She had a cold last month with rhinorrhea, sore throat, coughing, and sneezing. She has tried several OTC medications which have not helped. She also started flonase 1-2 weeks ago which she reports has made her cough more productive. She is currently taking protonix for reflux. Cough is worse at night and in the morning. No smoking history, though she has lived with a smoker for 30 years.   ROS: Per HPI  PMH:  The following were reviewed and entered/updated in epic: Past Medical History  Diagnosis Date  . Hearing difficulty     Mild   . Diabetes mellitus ([place])   . Coronary artery disease     a. cath 6/16 Severe multivessel cad with diffuse significant segmental LAD stenosis, 95% stenosis in a large second diagonal, 75% ostial stenosis in the smaller first diagonal, 95% mid and 65% distal circumflex, 95% ostial/proximal PDA, and 40-50% left main ostial disease. Med rx initially, to be re-evaluated by CVTS.  Marland Kitchen ESRD (end stage renal disease) on dialysis ([place])        . Hypertension   . Ischemic cardiomyopathy     a. Echo 04/2015: EF 35%. Cath same time - 45%.  . Syncope   . Hyponatremia   . Anemia   . Thrombocytopenia ([place])   . Memory loss   . Hyperlipidemia    Patient Active Problem List   Diagnosis Date Noted  . Cough 12/11/2015  . Hypoxia 12/03/2015  . Facial cellulitis 12/03/2015  . Facial swelling   . Angioedema 12/02/2015  . Hyperlipidemia   . Diabetes mellitus type 2 in obese ([place]) 05/07/2015  . CAD in native artery 04/25/2015  . Loss of  consciousness 04/23/2015  . Syncope 04/23/2015  . Fall   . Moderate nonproliferative diabetic retinopathy(362.05) 07/16/2014  . Other malaise and fatigue 06/12/2014  . Memory loss 06/12/2014  . Right knee pain 02/22/2014  . Trouble in sleeping 02/22/2014  . End stage renal disease ([place]) 03/16/2013  . Aftercare following surgery of the circulatory system, [place] 03/16/2013  . Nausea and vomiting 02/14/2013  . Anemia 02/14/2013  . Depression 08/28/2011  . Constipation 04/18/2011  . Low blood pressure 04/18/2011   Past Surgical History  Procedure Laterality Date  . 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 Esophagogastroduodenoscopy Left 02/20/2013    Procedure: ESOPHAGOGASTRODUODENOSCOPY (EGD);  Surgeon: Cleotis Nipper, MD;  Location: [facility] ENDOSCOPY;  Service: Endoscopy;  Laterality: Left;  . Av fistula placement Left 03/07/2013    Procedure: ARTERIOVENOUS (AV) FISTULA CREATION;  Surgeon: Elam Dutch, MD;  Location: [facility] OR;  Service: Vascular;  Laterality: Left;  left brachiocephalic AVF  . Cardiac catheterization N/A 04/25/2015    Procedure: Left Heart Cath and Coronary Angiography;  Surgeon: Belva Crome, MD;  Location: [facility] CV LAB;  Service: Cardiovascular;  Laterality: N/A;   Objective:  Physical Exam: BP 147/69 mmHg  Pulse 69  Temp(Src) 97.8 F (36.6 C) (Oral)  Ht 5\' 4"  (1.626 m)  Wt 180 lb (81.647 kg)  BMI 30.88  kg/m2  SpO2 98%  Gen: NAD, resting comfortably CV: RRR with no murmurs appreciated Pulm: NWOB, bibasilar crackles noted, otherwise clear GI: Normal bowel sounds present. Soft, Nontender, Nondistended. MSK: no edema, cyanosis, or clubbing noted Skin: warm, dry Neuro: grossly normal, moves all extremities Psych: Normal affect and thought content  Assessment/Plan:  Cough Likely post-viral syndrome. Patient already on flonase and protonix. Will give prescription for loratadine for additional anti-histamine effects. Will also  prescribe tessalon. Typical course of illness discussed with patient. CXR negative last week. Patient is not a former smoker but did live with her husband who smoked for 30 years. Can consider CT scan if cough persists at next appointment.   Anaphylaxis Now resolved. No follow up needed. Instructed patient to avoid eye drops that caused her reaction.   Algis Greenhouse. Jerline Pain, [place] Medicine Resident PGY-2 12/11/2015 2:40 PM

## 2015-12-11 NOTE — Assessment & Plan Note (Signed)
Likely post-viral syndrome. Patient already on flonase and protonix. Will give prescription for loratadine for additional anti-histamine effects. Will also prescribe tessalon. Typical course of illness discussed with patient. CXR negative last week. Patient is not a former smoker but did live with her husband who smoked for 30 years. Can consider CT scan if cough persists at next appointment.

## 2015-12-12 ENCOUNTER — Inpatient Hospital Stay: Payer: Medicare Other

## 2015-12-12 ENCOUNTER — Inpatient Hospital Stay: Payer: Medicare Other | Admitting: Family Medicine

## 2015-12-13 ENCOUNTER — Encounter: Payer: Self-pay | Admitting: Interventional Cardiology

## 2015-12-13 ENCOUNTER — Ambulatory Visit (INDEPENDENT_AMBULATORY_CARE_PROVIDER_SITE_OTHER): Payer: Medicare Other | Admitting: Interventional Cardiology

## 2015-12-13 VITALS — BP 136/88 | HR 71 | Ht 64.5 in | Wt 177.8 lb

## 2015-12-13 DIAGNOSIS — I2510 Atherosclerotic heart disease of native coronary artery without angina pectoris: Secondary | ICD-10-CM | POA: Diagnosis not present

## 2015-12-13 DIAGNOSIS — I255 Ischemic cardiomyopathy: Secondary | ICD-10-CM | POA: Diagnosis not present

## 2015-12-13 DIAGNOSIS — N186 End stage renal disease: Secondary | ICD-10-CM | POA: Diagnosis not present

## 2015-12-13 DIAGNOSIS — E119 Type 2 diabetes mellitus without complications: Secondary | ICD-10-CM | POA: Diagnosis not present

## 2015-12-13 NOTE — Patient Instructions (Signed)
Your physician recommends that you continue on your current medications as directed. Please refer to the Current Medication list given to you today. Your physician recommends that you schedule a follow-up appointment in: 4 months with Dr. Tamala Julian.  You have been referred to Dr. Prescott Gum, Cardiothoracic Surgery.  Someone from his office will call to schedule your appointment.  Please call Dr. Thompson Caul office if you have not heard from them in the next 7-14 days.

## 2015-12-13 NOTE — Progress Notes (Signed)
Cardiology Office Note   Date:  12/13/2015   ID:  [name] [name], DOB Jan 17, 1951, MRN [id number]  PCP:  Smiley Houseman, MD  Cardiologist:  [name], MD   Chief Complaint  Patient presents with  . Coronary Artery Disease    Three-vessel CAD including left main      History of Present Illness: [name] [name] is a 65 y.o. female who presents for f/u severe three-vessel coronary disease including left main, end-stage kidney disease, diabetes mellitus, and ischemic cardiomyopathy, LVEF 40-45%.  The patient is doing well. She has significantly improved since June when she was identified to have severe three-vessel coronary disease. At that time she presented with syncope in the setting of non-ST elevation myocardial infarction. She has sustained head trauma. She was physically deconditioned. She currently is having dyspnea with activity but no overt heart failure. I believe dyspnea represents anginal equivalent in her case. She is in rehabilitation and improving. She was able to ambulate into the office today. Her appetite is improved. Dialysis is going well. We will unable to use ice as our by the mononitrate because of hypotension on dialysis. Recent echo demonstrated that LV function has improved TE F of 45%.  Past Medical History  Diagnosis Date  . Hearing difficulty     Mild   . Diabetes mellitus ([place])   . Coronary artery disease     a. cath 6/16 Severe multivessel cad with diffuse significant segmental LAD stenosis, 95% stenosis in a large second diagonal, 75% ostial stenosis in the smaller first diagonal, 95% mid and 65% distal circumflex, 95% ostial/proximal PDA, and 40-50% left main ostial disease. Med rx initially, to be re-evaluated by CVTS.  Marland Kitchen ESRD (end stage renal disease) on dialysis ([place])        . Hypertension   . Ischemic cardiomyopathy     a. Echo 04/2015: EF 35%. Cath same time - 45%.  . Syncope   . Hyponatremia   . Anemia   . Thrombocytopenia ([place])    . Memory loss   . Hyperlipidemia     Past Surgical History  Procedure Laterality Date  . 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 Esophagogastroduodenoscopy Left 02/20/2013    Procedure: ESOPHAGOGASTRODUODENOSCOPY (EGD);  Surgeon: Cleotis Nipper, MD;  [facility];  Service: Endoscopy;  Laterality: Left;  . Av fistula placement Left 03/07/2013    Procedure: ARTERIOVENOUS (AV) FISTULA CREATION;  Surgeon: Elam Dutch, MD;  Location: [facility] OR;  Service: Vascular;  Laterality: Left;  left brachiocephalic AVF  . Cardiac catheterization N/A 04/25/2015    Procedure: Left Heart Cath and Coronary Angiography;  Surgeon: Belva Crome, MD;  Location: [facility] CV LAB;  Service: Cardiovascular;  Laterality: N/A;     Current Outpatient Prescriptions  Medication Sig Dispense Refill  . acetaminophen (TYLENOL) 500 MG tablet Take 500 mg by mouth every 6 (six) hours as needed for headache.    Marland Kitchen aspirin 81 MG tablet Take 81 mg by mouth daily.    Marland Kitchen atorvastatin (LIPITOR) 40 MG tablet Take 40 mg by mouth every morning.    . calcium carbonate (OS-CAL - DOSED IN MG OF ELEMENTAL CALCIUM) 1250 (500 CA) MG tablet Take two (2) tablets by mouth three (3) times daily with meals and take two (2) tablets by mouth with snacks.    . docusate sodium (COLACE) 100 MG capsule Take 100 mg by mouth daily as needed for mild constipation  or moderate constipation.    . fluticasone (FLONASE) 50 MCG/ACT nasal spray Place 1 spray into both nostrils daily as needed for allergies or rhinitis.    Marland Kitchen lanthanum (FOSRENOL) 1000 MG chewable tablet Chew 1,000 mg by mouth 3 (three) times daily with meals.    Marland Kitchen loratadine (CLARITIN) 10 MG tablet Take 1 tablet (10 mg total) by mouth daily. 30 tablet 11  . midodrine (PROAMATINE) 10 MG tablet Take one (1) tablet (10 mg total) by mouth at bedtime on Mondays, Wednesdays, and Fridays. Take one (1) tablet (10 mg total) by mouth pre-dialysis on Tuesdays,  Thursdays, and Saturdays.  3  . multivitamin (RENA-VIT) TABS tablet Take 1 tablet by mouth at bedtime. 90 tablet 3  . ondansetron (ZOFRAN) 4 MG tablet Take 4 mg by mouth every 6 (six) hours as needed for nausea or vomiting.    . pantoprazole (PROTONIX) 40 MG tablet TAKE 1 TABLET BY MOUTH DAILY 30 tablet 0  . promethazine (PHENERGAN) 25 MG tablet Take 25 mg by mouth every 6 (six) hours as needed for nausea or vomiting.     No current facility-administered medications for this visit.    Allergies:   Aleve; Motrin; and Nsaids    Social History:  The patient  reports that she has never smoked. She has never used smokeless tobacco. She reports that she does not drink alcohol or use illicit drugs.   Family History:  The patient's family history includes Bone cancer in her father; Throat cancer in her father.    ROS:  Please see the history of present illness.   Otherwise, review of systems are positive for back pain, cough, and upper respiratory illness..   All other systems are reviewed and negative.    PHYSICAL EXAM: VS:  BP 136/88 mmHg  Pulse 71  Ht 5' 4.5" (1.638 m)  Wt 177 lb 12.8 oz (80.65 kg)  BMI 30.06 kg/m2 , BMI Body mass index is 30.06 kg/(m^2). GEN: Well nourished, well developed, in no acute distress, chronically ill appearing but markedly improved compared to months ago. HEENT: normal Neck: no JVD, carotid bruits, or masses Cardiac: RRR.  There is no murmur, rub, or gallop. There is no edema. Respiratory:  clear to auscultation bilaterally, normal work of breathing. GI: soft, nontender, nondistended, + BS MS: no deformity or atrophy Skin: warm and dry, no rash Neuro:  Strength and sensation are intact Psych: euthymic mood, full affect   EKG:  EKG is not ordered today.   Recent Labs: 04/25/2015: TSH 1.974 12/02/2015: ALT 10*; B Natriuretic Peptide >4500.0* 12/03/2015: BUN 31*; Creatinine, Ser 5.23*; Hemoglobin 10.4*; Platelets 199; Potassium 4.2; Sodium 132*    Lipid  Panel    Component Value Date/Time   CHOL 93* 09/23/2015 0915   TRIG 81 09/23/2015 0915   HDL 34* 09/23/2015 0915   CHOLHDL 2.7 09/23/2015 0915   VLDL 16 09/23/2015 0915   LDLCALC 43 09/23/2015 0915      Wt Readings from Last 3 Encounters:  12/13/15 177 lb 12.8 oz (80.65 kg)  12/11/15 180 lb (81.647 kg)  12/03/15 177 lb 7.5 oz (80.5 kg)      Other studies Reviewed: Additional studies/ records that were reviewed today include: Included review of the coronary angiogram from 04/25/2015.Marland Kitchen The findings include:   . Diagnostic Diagram            Implants       ASSESSMENT AND PLAN:  1. CAD in native artery Severe three-vessel coronary disease including  moderate left main with dyspnea as anginal equivalent  2. Cardiomyopathy, ischemic Improved LVEF 40-45%. No evidence of volume overload on dialysis.  3. ESRD (end stage renal disease) ([place]) Stable on chronic dialysis but hypotensive when antique anginal therapy such as nitrates are added.  4. Diabetes mellitus type 2 in nonobese (HCC) Diabetes mellitus is under much better control.    Current medicines are reviewed at length with the patient today.  The patient has the following concerns regarding medicines: Will not add any additional medications and may affect hemodynamics..  The following changes/actions have been instituted:    The patient was turned down for surgery in June because of poor conditioning and comorbidities. She is much improved now and at her relatively young age I believe should be reconsidered for multivessel surgical revascularization. She has seen Dr. Prescott Gum in the past.  Labs/ tests ordered today include:   Orders Placed This Encounter  Procedures  . Ambulatory referral to Cardiothoracic Surgery     Disposition:   FU with HS in 6 months  Signed, [name], MD  12/13/2015 1:13 PM    [facility] [place], [place], [place]  [address] Phone: [phone]; Fax: [phone]

## 2015-12-18 ENCOUNTER — Other Ambulatory Visit: Payer: Self-pay | Admitting: Family Medicine

## 2015-12-18 ENCOUNTER — Encounter: Payer: Medicare Other | Admitting: Cardiothoracic Surgery

## 2015-12-18 NOTE — Progress Notes (Signed)
This encounter was created in error - please disregard.

## 2015-12-20 ENCOUNTER — Encounter: Payer: Self-pay | Admitting: Cardiothoracic Surgery

## 2015-12-20 ENCOUNTER — Other Ambulatory Visit: Payer: Self-pay | Admitting: Internal Medicine

## 2015-12-20 ENCOUNTER — Other Ambulatory Visit: Payer: Self-pay | Admitting: *Deleted

## 2015-12-20 ENCOUNTER — Ambulatory Visit (INDEPENDENT_AMBULATORY_CARE_PROVIDER_SITE_OTHER): Payer: Medicare Other | Admitting: Cardiothoracic Surgery

## 2015-12-20 VITALS — BP 140/73 | HR 66 | Resp 20 | Ht 64.5 in | Wt 177.0 lb

## 2015-12-20 DIAGNOSIS — I2510 Atherosclerotic heart disease of native coronary artery without angina pectoris: Secondary | ICD-10-CM

## 2015-12-20 NOTE — Progress Notes (Addendum)
PCP is Smiley Houseman, MD Referring Provider is Belva Crome, MD  Chief Complaint  Patient presents with  . Coronary Artery Disease    Surgical eval, ECHO 12/09/15, Cardiac Cath 04/25/15  patient examined, previous cardiac catheterization last summer and most recent  Echocardiogram personally reviewed and counseled with patient  HPI: the patient returns for discussion of her known three-vessel coronary disease and moderate LV dys function with moderate tricuspid regurgitation and probable pulmonary hypertension. I saw the patient in consultation last summer when she presented with a fall from syncope with severe facial and head trauma, bleeding and hematoma. She ruled in for an MI as well. Cardiac catheterization demonstrates a 90% stenosis of the LAD, 80% stenosis of the ramus, 90% stenosis of the cervix marginal and probable chronic occlusion of the RCA. LVEDP was 20. The patient recovered from her injuries without further cardiac problems and she is been treated and managed medically . She complains of shortness of breath with exertion. Her chest x-ray last month shows evidence of interstitial edema and probably some right-sided CHF. She's had no chest pain. Now that she is recovered from her fall her cardiologist wishes her to be reevaluated for potential CABG.she has had no further falls and is using a rolling walker for stability. Her EF by last echo is 45% but she still has evidence of pulmonary hypertension and moderate TR.  Problems identified at the time of the original consultation included severe bilateral venous stasis disease probably related to right-sided CHFand lack of conduit, chronic dialysis-dependent renal failure now dependent on midodrine to maintain blood pressure, and general frailty.  The patient has since moved into assisted  Living center and participate in physical therapy 3 days a week when she is not on dialysis. Her husband has died from heart disease and diabetes.  She continues to have no specific complaint of chest pain but does have increasing fatigue.   Past Medical History  Diagnosis Date  . Hearing difficulty     Mild   . Diabetes mellitus ([place])   . Coronary artery disease     a. cath 6/16 Severe multivessel cad with diffuse significant segmental LAD stenosis, 95% stenosis in a large second diagonal, 75% ostial stenosis in the smaller first diagonal, 95% mid and 65% distal circumflex, 95% ostial/proximal PDA, and 40-50% left main ostial disease. Med rx initially, to be re-evaluated by CVTS.  Marland Kitchen ESRD (end stage renal disease) on dialysis ([place])        . Hypertension   . Ischemic cardiomyopathy     a. Echo 04/2015: EF 35%. Cath same time - 45%.  . Syncope   . Hyponatremia   . Anemia   . Thrombocytopenia ([place])   . Memory loss   . Hyperlipidemia     Past Surgical History  Procedure Laterality Date  . 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 Esophagogastroduodenoscopy Left 02/20/2013    Procedure: ESOPHAGOGASTRODUODENOSCOPY (EGD);  Surgeon: Cleotis Nipper, MD;  Location: [facility] ENDOSCOPY;  Service: Endoscopy;  Laterality: Left;  . Av fistula placement Left 03/07/2013    Procedure: ARTERIOVENOUS (AV) FISTULA CREATION;  Surgeon: Elam Dutch, MD;  Location: [facility] OR;  Service: Vascular;  Laterality: Left;  left brachiocephalic AVF  . Cardiac catheterization N/A 04/25/2015    Procedure: Left Heart Cath and Coronary Angiography;  Surgeon: Belva Crome, MD;  Location: [place] CV LAB;  Service: Cardiovascular;  Laterality: N/A;    Family History  Problem Relation Age of Onset  . Heart disease    . Diabetes    . Stroke    . Bone cancer Father     Of jaws   . Throat cancer Father     Social History Social History  Substance Use Topics  . Smoking status: Never Smoker   . Smokeless tobacco: Never Used  . Alcohol Use: No    Current Outpatient Prescriptions  Medication Sig Dispense Refill  . acetaminophen  (TYLENOL) 500 MG tablet Take 500 mg by mouth every 6 (six) hours as needed for headache.    Marland Kitchen aspirin 81 MG tablet Take 81 mg by mouth daily.    Marland Kitchen atorvastatin (LIPITOR) 40 MG tablet Take 40 mg by mouth every morning.    . calcium carbonate (OS-CAL - DOSED IN MG OF ELEMENTAL CALCIUM) 1250 (500 CA) MG tablet Take two (2) tablets by mouth three (3) times daily with meals and take two (2) tablets by mouth with snacks.    . docusate sodium (COLACE) 100 MG capsule Take 100 mg by mouth daily as needed for mild constipation or moderate constipation.    . fluticasone (FLONASE) 50 MCG/ACT nasal spray Place 1 spray into both nostrils daily as needed for allergies or rhinitis.    Marland Kitchen lanthanum (FOSRENOL) 1000 MG chewable tablet Chew 1,000 mg by mouth 3 (three) times daily with meals.    Marland Kitchen loratadine (CLARITIN) 10 MG tablet Take 1 tablet (10 mg total) by mouth daily. 30 tablet 11  . midodrine (PROAMATINE) 10 MG tablet Take one (1) tablet (10 mg total) by mouth at bedtime on Mondays, Wednesdays, and Fridays. Take one (1) tablet (10 mg total) by mouth pre-dialysis on Tuesdays, Thursdays, and Saturdays.  3  . multivitamin (RENA-VIT) TABS tablet Take 1 tablet by mouth at bedtime. 90 tablet 3  . ondansetron (ZOFRAN) 4 MG tablet Take 4 mg by mouth every 6 (six) hours as needed for nausea or vomiting.    . pantoprazole (PROTONIX) 40 MG tablet TAKE 1 TABLET BY MOUTH DAILY 30 tablet 0  . promethazine (PHENERGAN) 25 MG tablet Take 25 mg by mouth every 6 (six) hours as needed for nausea or vomiting.     No current facility-administered medications for this visit.    Allergies  Allergen Reactions  . Aleve [Naproxen Sodium] Other (See Comments)    Due to kidneys.  . Motrin [Ibuprofen] Other (See Comments)    Due to kidneys.  . Nsaids Other (See Comments)    KIDNEY FAILURE AND ONLY HAS PARTIAL LEFT KIDNEY REMAINING (RIGHT ONE TAKEN OUT)    Review of Systems         Review of Systems :  [ y ] = yes, [  ] =  no   The patient has a AV fistula in her left upper extremity for dialysis access for the past 3 years The patient is right-hand dominant The patient has increasing problems with vision of her left eye and is pending cataract surgery and possible further ocular surgery for elevated intraocular pressure. The patient has a persistent sinusitis-upper respiratory infection with persistent cough increased by her primary care physician        General :  Weight gain [   ]    Weight loss  [  Yes intentional ]  Fatigue [  ]  Fever [  ]  Chills  [  ]  Weakness  [place].Blacker  ]           Cardiac :  Chest pain/ pressure [  ]  Resting SOB [  ] exertional SOB [place].Blacker  ]                        Orthopnea [  ]  Pedal edema  [ yes right greater than left ]  Palpitations [  ] Syncope/presyncope [yes but not recently ]                        Paroxysmal nocturnal dyspnea [  ]        Pulmonary : cough [place].Blacker  ]  wheezing [  ]  Hemoptysis [  ] Sputum [ yes some cloudy sputum ] Snoring [  ]                              Pneumothorax [  ]  Sleep apnea [  ]       GI : Vomiting [  ]  Dysphagia [  ]  Melena  [  ]  Abdominal pain [  ] BRBPR [  ]              Heart burn [  ]  Constipation [  ] Diarrhea  [  ] Colonoscopy [  ]       GU : Hematuria [  ]  Dysuria [  ]  Nocturia [  ] UTI's [  ]       Vascular : Claudication [  ]  Rest pain [  ]  DVT [  ] Vein stripping [  ] leg ulcers [  ]                          TIA [  ] Stroke [  ]  Varicose veins [venous stasis changes of fairly severe of both pretibial lower extremities  ]       NEURO :  Headaches  [  ] Seizures [  ] Vision changes [yes problems with vision left eye  ] Paresthesias [  ]       Musculoskeletal :  Arthritis [ yes hip problems ] Gout  [  ]  Back pain [  ]  Joint pain [  ]       Skin :  Rash [  ]  Melanoma [  ]        Heme : Bleeding problems [  ]Clotting Disorders [  ] Anemia [  ]Blood Transfusion [ ]        Endocrine : Diabetes [yes now  diet-controlled  ] Thyroid Disorder  [  ]       Psych : Depression [  ]  Anxiety [  ]  Psych hospitalizations [  ]         No previous history of thoracic trauma or chest surgery                                      BP 140/73 mmHg  Pulse 66  Resp 20  Ht 5' 4.5" (1.638 m)  Wt 177 lb (80.287 kg)  BMI 29.92 kg/m2  SpO2 92% Physical Exam      Physical  Exam  General: very pleasant 65 year old female who appears much older than her stated age. She definitely looks better today than on her initial exam after her  fall and severe facial trauma last summer HEENT: Normocephalic pupils equal , dentition adequate, left eye with partial droop Neck: Supple without JVD, adenopathy, loud left carotid bruit Chest: Clear to auscultation, symmetrical breath sounds, no rhonchi, no tenderness             or deformity Cardiovascular: Regular rate and rhythm, 2/6 TR murmur, no gallop, peripheral pulses not palpable in the feet             Abdomen:  Soft, nontender, no palpable mass or organomegaly Extremities: Warm, well-perfused, no clubbing cyanosis edema or tenderness,large aneurysmal patent left upper arm AV fistula              Severe bilateral venous stasis changes of the legs Rectal/GU: Deferred Neuro: Grossly non--focal , generally weak. The patient was able walk 50 feet in the office hallway with a rolling walker without dropping room air oxygen saturations from 98% Skin: Clean and dry without rash or ulceration  Diagnostic Tests: Recent echocardiogram and passed coronary angiogram personally reviewed and counseled the patient  Impression: Patient is in better condition now in after her fall last summer She still remains high risk for CABG because of mainly comorbid  medical problems, potential problems with vein conduit, and potential difficulty with protective hemodialysis following heart surgery. She also has a loud left carotid bruit and has not been evaluated  Plan: I discussed the  issue of CABG with the patient and that it would be at significantly high-risk. Before we reach a final decision we will proceed with vein mapping to assess the adequacy of her saphenous vein and assess her carotid circulation with a ultrasound. She  will return for further discussion after these tests are completed.If  surgery is scheduled she will probably need a right heart catheterization as an outpatient before admission for surgery as her pulmonary hypertension may need optimization if severe (preoperative milrinone-sildenafil).  Len Childs, MD [facility] [phone]

## 2015-12-20 NOTE — Telephone Encounter (Signed)
Was giving cough  medicine on Jan 25 by dr Jerline Pain.  She has no refills and would like more.  Pharmacy is [facility] on [address]

## 2015-12-23 MED ORDER — BENZONATATE 200 MG PO CAPS: 200.0000 mg | ORAL_CAPSULE | Freq: Three times a day (TID) | ORAL | Status: DC | PRN

## 2015-12-27 ENCOUNTER — Ambulatory Visit (HOSPITAL_BASED_OUTPATIENT_CLINIC_OR_DEPARTMENT_OTHER)
Admission: RE | Admit: 2015-12-27 | Discharge: 2015-12-27 | Disposition: A | Discharge status: Home / Self Care | Payer: Medicare Other | Source: Ambulatory Visit | Attending: Cardiothoracic Surgery | Admitting: Cardiothoracic Surgery

## 2015-12-27 ENCOUNTER — Ambulatory Visit (HOSPITAL_COMMUNITY)
Admission: RE | Admit: 2015-12-27 | Discharge: 2015-12-27 | Disposition: A | Discharge status: Home / Self Care | Payer: Medicare Other | Source: Ambulatory Visit | Attending: Cardiothoracic Surgery | Admitting: Cardiothoracic Surgery

## 2015-12-27 DIAGNOSIS — Z01810 Encounter for preprocedural cardiovascular examination: Secondary | ICD-10-CM | POA: Insufficient documentation

## 2015-12-27 DIAGNOSIS — I2510 Atherosclerotic heart disease of native coronary artery without angina pectoris: Secondary | ICD-10-CM | POA: Diagnosis not present

## 2015-12-27 DIAGNOSIS — I6523 Occlusion and stenosis of bilateral carotid arteries: Secondary | ICD-10-CM | POA: Insufficient documentation

## 2015-12-27 NOTE — Progress Notes (Signed)
Pre op   Carotid Findings:   Findings consistent with 1- 39 percent, stenosis involving the right internal carotid artery. Right vertebral artery is retrograde. Findings consistent with 40-59%,stenosis involving the left Internal carotid artery. Left vertebral artery is antegrade.  Upper Extremity Right Left  Brachial Pressures 108 Triphasic  AVFistula  Radial Waveforms        Triphasic AVFistula  Ulnar Waveforms         Triphasic AVFistula  Palmar Arch (Allen's Test) abnormal in ulnar artery AVFistula   Findings:   Palmar Arch only in the right arm  Right Radial artery is normal-ulnar is abnormal occluded with compression at rest. Left palmar non -imaged due to av fistula.  Lower  Extremity Right Left  Dorsalis Pedis Triphasic Triphasic  Posterior Tibial Triphasic Biphasic    Findings:   Waveforms indicate triphasic flow in the right leg and left leg in dorsal ped are with in normal limits--Biphasic in the left posterior tibial artery at rest.

## 2015-12-27 NOTE — Progress Notes (Signed)
  VASCULAR LAB PRELIMINARY  PRELIMINARY  PRELIMINARY  PRELIMINARY  Right Lower Extremity Vein Map    Right Great Saphenous Vein   Segment Diameter Comment  1. Origin 8.7 mm   2. High Thigh 5.1 mm   3. Mid Thigh 5.2 mm   4. Low Thigh 4.1 mm Branch  5. At Knee 3.3 mm   6. High Calf 4.9 mm Branch  7. Low Calf 2.9 mm   8. Ankle 2.9 mm Branch   mm     Left Lower Extremity Vein Map    Left Great Saphenous Vein   Segment Diameter Comment  1. Origin 6.5 mm   2. High Thigh 4.8 mm   3. Mid Thigh 4.3 mm   4. Low Thigh 4.2 mm Branch x2  5. At Knee 4.1 mm   6. High Calf 4.6 mm   7. Low Calf 2.6 mm Branch  8. Ankle 3.4 mm    mm    Refer to tables above.  Elvie Maines, RVT, RDMS 12/27/2015, 3:22 PM

## 2016-01-01 ENCOUNTER — Encounter: Payer: Self-pay | Admitting: Cardiothoracic Surgery

## 2016-01-01 ENCOUNTER — Ambulatory Visit (INDEPENDENT_AMBULATORY_CARE_PROVIDER_SITE_OTHER): Payer: Medicare Other | Admitting: Cardiothoracic Surgery

## 2016-01-01 VITALS — BP 128/68 | HR 68 | Resp 16 | Ht 64.5 in | Wt 177.0 lb

## 2016-01-01 DIAGNOSIS — I2510 Atherosclerotic heart disease of native coronary artery without angina pectoris: Secondary | ICD-10-CM

## 2016-01-01 NOTE — Progress Notes (Signed)
PCP is Smiley Houseman, MD Referring Provider is Belva Crome, MD  Chief Complaint  Patient presents with  . Follow-up    to further discuss possible CABG...review vein mapping and carotid u/s    HPI:the patient returns for further discussion of possible multivessel CABG after the initial consultation last month. Today's a nondialysis day.she presents in a wheelchair. She denies angina. She does have some mild dyspnea with exertion.  Since her initial visit she has undergone pre-CABG Dopplers which show mild carotid disease but no critical stenosis. She has undergone vein mapping because of severe venous stasis disease of both lower extremities which show poor quality vein conduit in both legs.  Because of her poor overall functional status with dialysis, poor mobility, low blood pressure requiring midodrine , lack of adequate conduit for grafts, I would not recommend CABG for the patient.   Past Medical History  Diagnosis Date  . Hearing difficulty     Mild   . Diabetes mellitus ([place])   . Coronary artery disease     a. cath 6/16 Severe multivessel cad with diffuse significant segmental LAD stenosis, 95% stenosis in a large second diagonal, 75% ostial stenosis in the smaller first diagonal, 95% mid and 65% distal circumflex, 95% ostial/proximal PDA, and 40-50% left main ostial disease. Med rx initially, to be re-evaluated by CVTS.  Marland Kitchen ESRD (end stage renal disease) on dialysis ([place])        . Hypertension   . Ischemic cardiomyopathy     a. Echo 04/2015: EF 35%. Cath same time - 45%.  . Syncope   . Hyponatremia   . Anemia   . Thrombocytopenia ([place])   . Memory loss   . Hyperlipidemia     Past Surgical History  Procedure Laterality Date  . 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 Esophagogastroduodenoscopy Left 02/20/2013    Procedure: ESOPHAGOGASTRODUODENOSCOPY (EGD);  Surgeon: Cleotis Nipper, MD;  Location: [facility] ENDOSCOPY;  Service: Endoscopy;   Laterality: Left;  . Av fistula placement Left 03/07/2013    Procedure: ARTERIOVENOUS (AV) FISTULA CREATION;  Surgeon: Elam Dutch, MD;  Location: [facility] OR;  Service: Vascular;  Laterality: Left;  left brachiocephalic AVF  . Cardiac catheterization N/A 04/25/2015    Procedure: Left Heart Cath and Coronary Angiography;  Surgeon: Belva Crome, MD;  Location: [place] CV LAB;  Service: Cardiovascular;  Laterality: N/A;    Family History  Problem Relation Age of Onset  . Heart disease    . Diabetes    . Stroke    . Bone cancer Father     Of jaws   . Throat cancer Father     Social History Social History  Substance Use Topics  . Smoking status: Never Smoker   . Smokeless tobacco: Never Used  . Alcohol Use: No    Current Outpatient Prescriptions  Medication Sig Dispense Refill  . acetaminophen (TYLENOL) 500 MG tablet Take 500 mg by mouth every 6 (six) hours as needed for headache.    Marland Kitchen aspirin 81 MG tablet Take 81 mg by mouth daily.    Marland Kitchen atorvastatin (LIPITOR) 40 MG tablet Take 40 mg by mouth every morning.    . benzonatate (TESSALON) 200 MG capsule Take 1 capsule (200 mg total) by mouth 3 (three) times daily as needed for cough. 20 capsule 0  . calcium carbonate (OS-CAL - DOSED IN MG OF ELEMENTAL CALCIUM) 1250 (500 CA) MG tablet Take two (2) tablets by  mouth three (3) times daily with meals and take two (2) tablets by mouth with snacks.    . docusate sodium (COLACE) 100 MG capsule Take 100 mg by mouth daily as needed for mild constipation or moderate constipation.    . fluticasone (FLONASE) 50 MCG/ACT nasal spray Place 1 spray into both nostrils daily as needed for allergies or rhinitis.    Marland Kitchen lanthanum (FOSRENOL) 1000 MG chewable tablet Chew 1,000 mg by mouth 3 (three) times daily with meals.    Marland Kitchen loratadine (CLARITIN) 10 MG tablet Take 1 tablet (10 mg total) by mouth daily. 30 tablet 11  . midodrine (PROAMATINE) 10 MG tablet Take one (1) tablet (10 mg total) by mouth at bedtime on  Mondays, Wednesdays, and Fridays. Take one (1) tablet (10 mg total) by mouth pre-dialysis on Tuesdays, Thursdays, and Saturdays.  3  . multivitamin (RENA-VIT) TABS tablet Take 1 tablet by mouth at bedtime. 90 tablet 3  . ondansetron (ZOFRAN) 4 MG tablet Take 4 mg by mouth every 6 (six) hours as needed for nausea or vomiting.    . pantoprazole (PROTONIX) 40 MG tablet TAKE 1 TABLET BY MOUTH DAILY 30 tablet 0  . promethazine (PHENERGAN) 25 MG tablet Take 25 mg by mouth every 6 (six) hours as needed for nausea or vomiting.     No current facility-administered medications for this visit.    Allergies  Allergen Reactions  . Aleve [Naproxen Sodium] Other (See Comments)    Due to kidneys.  . Motrin [Ibuprofen] Other (See Comments)    Due to kidneys.  . Nsaids Other (See Comments)    KIDNEY FAILURE AND ONLY HAS PARTIAL LEFT KIDNEY REMAINING (RIGHT ONE TAKEN OUT)    Review of Systems  No changes from previous consultation She is able to work with physical therapy at the assisted-living facility for exercise classes 3 days a week  BP 128/68 mmHg  Pulse 68  Resp 16  Ht 5' 4.5" (1.638 m)  Wt 177 lb (80.287 kg)  BMI 29.92 kg/m2  SpO2 94% Physical Exam Chronically ill-appearing, fragile but in no distress Distant breath sounds Heart rate regular Woody  edema with venous stasis changes in both lower extremities  Diagnostic Tests: Pre-CABG Dopplers and vein mapping studies reviewed with patient as discussed above  Impression: Three-vessel coronary disease. Patient not a candidate for CABG for above reasons.  Plan: Return as needed. Continue medical therapy.  Len Childs, MD [facility] [phone]

## 2016-01-09 ENCOUNTER — Telehealth: Payer: Self-pay

## 2016-01-09 ENCOUNTER — Other Ambulatory Visit: Payer: Self-pay | Admitting: *Deleted

## 2016-01-09 NOTE — Telephone Encounter (Signed)
Per Dr.Smit pt needs an appt with him in March to discuss a plan of care. Pt sister and caretaker Manus Gunning aware of appt with Dr.Smith on 3/16 @ 9:45am

## 2016-01-10 ENCOUNTER — Telehealth: Payer: Self-pay | Admitting: Interventional Cardiology

## 2016-01-10 MED ORDER — BENZONATATE 200 MG PO CAPS: 200.0000 mg | ORAL_CAPSULE | Freq: Three times a day (TID) | ORAL | Status: DC | PRN

## 2016-01-10 NOTE — Telephone Encounter (Signed)
New Message  Pt called to speak w/ RN concerning her appt. Please call back and discuss.

## 2016-01-10 NOTE — Telephone Encounter (Signed)
Spoke with patient's caregiver and patient who called to reschedule appointment on 3/16 with Dr. Tamala Julian due to the fact that the caregiver has dialysis on Tues and Thurs and the patient though she was going to be out of town.  The appointment was moved to 6/2 by one of the schedulers because that is Dr. Thompson Caul next available.  I advised her that per note from Penni Homans, Dr. Thompson Caul primary CMA, he wants to see the patient in March to discuss plan of care.  They asked for appointment to be moved back to 3/16, however that spot has been filled.  I advised them that I will forward message to [facility] for follow-up next week.  They verbalized understanding and agreement.

## 2016-01-13 NOTE — Telephone Encounter (Signed)
Called pt to schedule appt with Dr.Smith on 3/16. lmtcb

## 2016-01-30 ENCOUNTER — Encounter: Payer: Self-pay | Admitting: Interventional Cardiology

## 2016-01-30 ENCOUNTER — Ambulatory Visit: Payer: Medicare Other | Admitting: Interventional Cardiology

## 2016-01-30 ENCOUNTER — Ambulatory Visit (INDEPENDENT_AMBULATORY_CARE_PROVIDER_SITE_OTHER): Payer: Medicare Other | Admitting: Interventional Cardiology

## 2016-01-30 ENCOUNTER — Telehealth: Payer: Self-pay

## 2016-01-30 VITALS — BP 156/88 | HR 86 | Ht 64.5 in | Wt 171.4 lb

## 2016-01-30 DIAGNOSIS — I255 Ischemic cardiomyopathy: Secondary | ICD-10-CM | POA: Diagnosis not present

## 2016-01-30 DIAGNOSIS — I2510 Atherosclerotic heart disease of native coronary artery without angina pectoris: Secondary | ICD-10-CM | POA: Diagnosis not present

## 2016-01-30 DIAGNOSIS — N186 End stage renal disease: Secondary | ICD-10-CM

## 2016-01-30 LAB — BASIC METABOLIC PANEL
BUN: 20 mg/dL (ref 7–25)
CHLORIDE: 94 mmol/L — AB (ref 98–110)
CO2: 29 mmol/L (ref 20–31)
Calcium: 9 mg/dL (ref 8.6–10.4)
Creat: 4.58 mg/dL — ABNORMAL HIGH (ref 0.50–0.99)
GLUCOSE: 120 mg/dL — AB (ref 65–99)
POTASSIUM: 3.4 mmol/L — AB (ref 3.5–5.3)
SODIUM: 136 mmol/L (ref 135–146)

## 2016-01-30 LAB — CBC WITH DIFFERENTIAL/PLATELET
BASOS PCT: 1 % (ref 0–1)
Basophils Absolute: 0.1 10*3/uL (ref 0.0–0.1)
EOS ABS: 0.2 10*3/uL (ref 0.0–0.7)
EOS PCT: 4 % (ref 0–5)
HCT: 36 % (ref 36.0–46.0)
Hemoglobin: 12.1 g/dL (ref 12.0–15.0)
Lymphocytes Relative: 12 % (ref 12–46)
Lymphs Abs: 0.6 10*3/uL — ABNORMAL LOW (ref 0.7–4.0)
MCH: 31.5 pg (ref 26.0–34.0)
MCHC: 33.6 g/dL (ref 30.0–36.0)
MCV: 93.8 fL (ref 78.0–100.0)
MONO ABS: 0.7 10*3/uL (ref 0.1–1.0)
MONOS PCT: 13 % — AB (ref 3–12)
MPV: 10.8 fL (ref 8.6–12.4)
Neutro Abs: 3.7 10*3/uL (ref 1.7–7.7)
Neutrophils Relative %: 70 % (ref 43–77)
PLATELETS: 201 10*3/uL (ref 150–400)
RBC: 3.84 MIL/uL — ABNORMAL LOW (ref 3.87–5.11)
RDW: 15.2 % (ref 11.5–15.5)
WBC: 5.3 10*3/uL (ref 4.0–10.5)

## 2016-01-30 LAB — PROTIME-INR
INR: 1.1 (ref <1.50)
PROTHROMBIN TIME: 14.3 s (ref 11.6–15.2)

## 2016-01-30 MED ORDER — CLOPIDOGREL BISULFATE 75 MG PO TABS: 75.0000 mg | ORAL_TABLET | Freq: Every day | ORAL | Status: DC

## 2016-01-30 NOTE — Telephone Encounter (Signed)
lmom ad pt requested today at her o/v. Pt procedure (multi vessel pci) is scheduled with Dr.Smith for  Mon 3/20 @ 10:30am, pt to report to [facility] @ 8:30am. No Po after midnight. Pt or her sister should call back to confirm, date, time, pre-procedure instructions.

## 2016-01-30 NOTE — Progress Notes (Signed)
Cardiology Office Note   Date:  01/30/2016   ID:  [name] BACIGALUPO, DOB May 27, 1951, MRN [id number]  PCP:  [name] Houseman, MD  Cardiologist:  [name] Grooms, MD   Chief Complaint  Patient presents with  . Coronary Artery Disease      History of Present Illness: [name] [name] is a 65 y.o. female who presents for Follow-up of multivessel CAD, surgical turn down, limiting dyspnea felt to be an anginal equivalent, and end-stage renal disease.  The patient has seen Dr.Van [name] on multiple occasions to determine if she would benefit from surgical revascularization. She was ultimately turned down because of debility and absence of venous conduits that would allow revascularization. She was discussed at half conference and the opinion was that she would benefit from multivessel PCI. Her symptoms are limiting dyspnea on exertion. She is in rehabilitation at her nursing facility. She dialyzes 3 times per week. She denies chest discomfort.  Cardiac cath in June 2016 demonstrated high-grade segmental LAD stenosis, relatively focal severe stenosis in the proximal circumflex, and distal RCA high-grade disease. At that time the sites would be amenable to PCI but with her diagnosis of diabetes surgical revascularization was felt to be the optimal treatment for the best long-term outcome.  Past Medical History  Diagnosis Date  . Hearing difficulty     Mild   . Diabetes mellitus ([place])   . Coronary artery disease     a. cath 6/16 Severe multivessel cad with diffuse significant segmental LAD stenosis, 95% stenosis in a large second diagonal, 75% ostial stenosis in the smaller first diagonal, 95% mid and 65% distal circumflex, 95% ostial/proximal PDA, and 40-50% left main ostial disease. Med rx initially, to be re-evaluated by CVTS.  [name] [name] ESRD (end stage renal disease) on dialysis ([place])        . Hypertension   . Ischemic cardiomyopathy     a. Echo 04/2015: EF 35%. Cath same time - 45%.  .  Syncope   . Hyponatremia   . Anemia   . Thrombocytopenia ([place])   . Memory loss   . Hyperlipidemia     Past Surgical History  Procedure Laterality Date  . 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 Esophagogastroduodenoscopy Left 02/20/2013    Procedure: ESOPHAGOGASTRODUODENOSCOPY (EGD);  Surgeon: [name] Nipper, MD;  Location: [facility] ENDOSCOPY;  Service: Endoscopy;  Laterality: Left;  . Av fistula placement Left 03/07/2013    Procedure: ARTERIOVENOUS (AV) FISTULA CREATION;  Surgeon: [name] Dutch, MD;  Location: [facility] OR;  Service: Vascular;  Laterality: Left;  left brachiocephalic AVF  . Cardiac catheterization N/A 04/25/2015    Procedure: Left Heart Cath and Coronary Angiography;  Surgeon: [name] Crome, MD;  Location: [facility] CV LAB;  Service: Cardiovascular;  Laterality: N/A;     Current Outpatient Prescriptions  Medication Sig Dispense Refill  . acetaminophen (TYLENOL) 500 MG tablet Take 500 mg by mouth every 6 (six) hours as needed for headache.    [name] [name] aspirin 81 MG tablet Take 81 mg by mouth daily.    [name] [name] atorvastatin (LIPITOR) 40 MG tablet Take 40 mg by mouth every morning.    . benzonatate (TESSALON) 200 MG capsule Take 1 capsule (200 mg total) by mouth 3 (three) times daily as needed for cough. 20 capsule 0  . calcium carbonate (OS-CAL - DOSED IN MG OF ELEMENTAL CALCIUM) 1250 (500 CA) MG tablet Take two (2) tablets by mouth three (3) times  daily with meals and take two (2) tablets by mouth with snacks.    . docusate sodium (COLACE) 100 MG capsule Take 100 mg by mouth daily as needed for mild constipation or moderate constipation.    . fluticasone (FLONASE) 50 MCG/ACT nasal spray Place 1 spray into both nostrils daily as needed for allergies or rhinitis.    [name] [name] lanthanum (FOSRENOL) 1000 MG chewable tablet Chew 1,000 mg by mouth 3 (three) times daily with meals.    . lidocaine-prilocaine (EMLA) cream APPLY SMALL AMOUNT TO ACCESS SITE 1 TO 2 HOURS BEFORE DIALYSIS.  COVER WITH OCCLUSIVE DRESSING (SARAN WRAP).  12  . loratadine (CLARITIN) 10 MG tablet Take 1 tablet (10 mg total) by mouth daily. 30 tablet 11  . midodrine (PROAMATINE) 10 MG tablet Take one (1) tablet (10 mg total) by mouth at bedtime on Mondays, Wednesdays, and Fridays. Take one (1) tablet (10 mg total) by mouth pre-dialysis on Tuesdays, Thursdays, and Saturdays.  3  . multivitamin (RENA-VIT) TABS tablet Take 1 tablet by mouth at bedtime. 90 tablet 3  . ondansetron (ZOFRAN) 4 MG tablet Take 4 mg by mouth every 6 (six) hours as needed for nausea or vomiting.    . pantoprazole (PROTONIX) 40 MG tablet TAKE 1 TABLET BY MOUTH DAILY 30 tablet 0  . promethazine (PHENERGAN) 25 MG tablet Take 25 mg by mouth every 6 (six) hours as needed for nausea or vomiting.     No current facility-administered medications for this visit.    Allergies:   Aleve; Motrin; and Nsaids    Social History:  The patient  reports that she has never smoked. She has never used smokeless tobacco. She reports that she does not drink alcohol or use illicit drugs.   Family History:  The patient's family history includes Bone cancer in her father; Throat cancer in her father.    ROS:  Please see the history of present illness.   Otherwise, review of systems are positive for weakness, limiting dyspnea, dizziness, and forgetfulness..   All other systems are reviewed and negative.    PHYSICAL EXAM: VS:  BP 156/88 mmHg  Pulse 86  Ht 5' 4.5" (1.638 m)  Wt 171 lb 6.4 oz (77.747 kg)  BMI 28.98 kg/m2 , BMI Body mass index is 28.98 kg/(m^2). GEN: Well nourished, well developed, in no acute distress HEENT: normal Neck: no JVD, carotid bruits, or masses Cardiac: RRR.  There is 2/6 systolic Murmur of mitral regurgitation heard at the apex radiating into the axilla. No rub or gallop. There is no edema. Loud bruit is heard over the fistula in her left forearm. Respiratory:  clear to auscultation bilaterally, normal work of  breathing. GI: soft, nontender, nondistended, + BS MS: no deformity or atrophy Skin: warm and dry, no rash Neuro:  Strength and sensation are intact Psych: euthymic mood, full affect   EKG:  EKG is not ordered today.    Recent Labs: 04/25/2015: TSH 1.974 12/02/2015: ALT 10*; B Natriuretic Peptide >4500.0* 12/03/2015: BUN 31*; Creatinine, Ser 5.23*; Hemoglobin 10.4*; Platelets 199; Potassium 4.2; Sodium 132*    Lipid Panel    Component Value Date/Time   CHOL 93* 09/23/2015 0915   TRIG 81 09/23/2015 0915   HDL 34* 09/23/2015 0915   CHOLHDL 2.7 09/23/2015 0915   VLDL 16 09/23/2015 0915   LDLCALC 43 09/23/2015 0915      Wt Readings from Last 3 Encounters:  01/30/16 171 lb 6.4 oz (77.747 kg)  01/01/16 177 lb (80.287 kg)  12/20/15 177 lb (80.287 kg)      Other studies Reviewed: Additional studies/ records that were reviewed today include: Digital angiography was reviewed with the patient and her daughter.. The findings include three-vessel disease with depressed LV function was discussed..    ASSESSMENT AND PLAN:  1. CAD in native artery Multivessel CAD involving LAD, circumflex, and distal RCA. Surgical turned down due to multiple medical comorbidities, absence of adequate venous conduits, end-stage renal disease, and fragility.  The patient has significant dyspnea on exertion that is an ischemic equivalent, New York Heart Association class III. We are unable to use much in way of medical anti-ischemic therapy because of hemodynamic instability on dialysis.  Plan is for high risk multivessel PCI (CHIP). Procedure and risks were discussed in detail with the patient and her sister.  2. ESRD (end stage renal disease) ([place]) Procedure were performed on a nondialysis day  3. Cardiomyopathy, ischemic/chronic combined systolic and diastolic heart failure Last LVEF in the 35-40% range at the time of cath in June 2016     Current medicines are reviewed at length with the  patient today.  The patient has the following concerns regarding medicines: *Dual antiplatelet therapy including Plavix 75 mg per day.  The following changes/actions have been instituted:    Plavix 75 mg daily  Schedule PCI, multivessel with extended Cath Lab slot timeThe patient was counseled to undergo left heart catheterization, coronary angiography, and percutaneous coronary intervention with stent implantation. The procedural risks and benefits were discussed in detail. The risks discussed included death, stroke, myocardial infarction, life-threatening bleeding, limb ischemia, kidney injury, allergy, and possible emergency cardiac surgery. The risk of these significant complications were estimated to occur less than 1% of the time. After discussion, the patient has agreed to proceed.  We spent great detail discussing her nonsurgical status. Therefore we have difficulty she would not be an emergency CABG candidate. Both she and the sister understand this potential problem.  Labs/ tests ordered today include:  No orders of the defined types were placed in this encounter.     Disposition:   FU with HS in 4 weeks  Signed, [name] Grooms, MD  01/30/2016 10:36 AM    [facility] [address], [place], [place]  [address] Phone: [phone]; Fax: [phone]

## 2016-01-30 NOTE — Patient Instructions (Signed)
Medication Instructions:  Your physician has recommended you make the following change in your medication:  START Plavix 75mg  daily. An Rx has been sent to your pharmacy   Labwork: Bmet, Cbc, Pt/Inr  Testing/Procedures: Your physician has requested that you have a cardiac catheterization. Cardiac catheterization is used to diagnose and/or treat various heart conditions. Doctors may recommend this procedure for a number of different reasons. The most common reason is to evaluate chest pain. Chest pain can be a symptom of coronary artery disease (CAD), and cardiac catheterization can show whether plaque is narrowing or blocking your heart's arteries. This procedure is also used to evaluate the valves, as well as measure the blood flow and oxygen levels in different parts of your heart. For further information please visit [email or web address]. Please follow instruction sheet, as given.   Follow-Up: Your physician recommends that you schedule a follow-up appointment pending procedure   Any Other Special Instructions Will Be Listed Below (If Applicable).     If you need a refill on your cardiac medications before your next appointment, please call your pharmacy.

## 2016-01-31 NOTE — Telephone Encounter (Signed)
Left voicemail requesting a call back to confirm that patient received the message left yesterday about her cath schedule/info. Asked that if possible to call back today.

## 2016-02-03 ENCOUNTER — Ambulatory Visit (HOSPITAL_COMMUNITY)
Admission: RE | Admit: 2016-02-03 | Discharge: 2016-02-04 | Disposition: A | Discharge status: Home / Self Care | Payer: Medicare Other | Source: Ambulatory Visit | Attending: Interventional Cardiology | Admitting: Interventional Cardiology

## 2016-02-03 ENCOUNTER — Encounter (HOSPITAL_COMMUNITY)
Admission: RE | Disposition: A | Discharge status: Home / Self Care | Payer: Self-pay | Source: Ambulatory Visit | Attending: Interventional Cardiology

## 2016-02-03 ENCOUNTER — Encounter (HOSPITAL_COMMUNITY): Payer: Self-pay | Admitting: General Practice

## 2016-02-03 DIAGNOSIS — I200 Unstable angina: Secondary | ICD-10-CM

## 2016-02-03 DIAGNOSIS — Z886 Allergy status to analgesic agent status: Secondary | ICD-10-CM | POA: Diagnosis not present

## 2016-02-03 DIAGNOSIS — Z7902 Long term (current) use of antithrombotics/antiplatelets: Secondary | ICD-10-CM | POA: Insufficient documentation

## 2016-02-03 DIAGNOSIS — I132 Hypertensive heart and chronic kidney disease with heart failure and with stage 5 chronic kidney disease, or end stage renal disease: Secondary | ICD-10-CM | POA: Diagnosis not present

## 2016-02-03 DIAGNOSIS — N186 End stage renal disease: Secondary | ICD-10-CM | POA: Diagnosis present

## 2016-02-03 DIAGNOSIS — E785 Hyperlipidemia, unspecified: Secondary | ICD-10-CM | POA: Diagnosis not present

## 2016-02-03 DIAGNOSIS — Z7982 Long term (current) use of aspirin: Secondary | ICD-10-CM | POA: Insufficient documentation

## 2016-02-03 DIAGNOSIS — Z955 Presence of coronary angioplasty implant and graft: Secondary | ICD-10-CM

## 2016-02-03 DIAGNOSIS — E1122 Type 2 diabetes mellitus with diabetic chronic kidney disease: Secondary | ICD-10-CM | POA: Diagnosis not present

## 2016-02-03 DIAGNOSIS — I959 Hypotension, unspecified: Secondary | ICD-10-CM | POA: Insufficient documentation

## 2016-02-03 DIAGNOSIS — I5042 Chronic combined systolic (congestive) and diastolic (congestive) heart failure: Secondary | ICD-10-CM | POA: Insufficient documentation

## 2016-02-03 DIAGNOSIS — I2510 Atherosclerotic heart disease of native coronary artery without angina pectoris: Secondary | ICD-10-CM | POA: Diagnosis present

## 2016-02-03 DIAGNOSIS — I25118 Atherosclerotic heart disease of native coronary artery with other forms of angina pectoris: Secondary | ICD-10-CM | POA: Diagnosis not present

## 2016-02-03 DIAGNOSIS — R413 Other amnesia: Secondary | ICD-10-CM | POA: Diagnosis present

## 2016-02-03 DIAGNOSIS — Z91041 Radiographic dye allergy status: Secondary | ICD-10-CM | POA: Diagnosis not present

## 2016-02-03 DIAGNOSIS — Z992 Dependence on renal dialysis: Secondary | ICD-10-CM | POA: Diagnosis not present

## 2016-02-03 DIAGNOSIS — D649 Anemia, unspecified: Secondary | ICD-10-CM | POA: Diagnosis not present

## 2016-02-03 DIAGNOSIS — E1169 Type 2 diabetes mellitus with other specified complication: Secondary | ICD-10-CM

## 2016-02-03 DIAGNOSIS — E669 Obesity, unspecified: Secondary | ICD-10-CM

## 2016-02-03 HISTORY — PX: CARDIAC CATHETERIZATION: SHX172

## 2016-02-03 HISTORY — PX: CORONARY STENT PLACEMENT: SHX1402

## 2016-02-03 LAB — BASIC METABOLIC PANEL
ANION GAP: 15 (ref 5–15)
BUN: 18 mg/dL (ref 6–20)
CALCIUM: 9.7 mg/dL (ref 8.9–10.3)
CO2: 30 mmol/L (ref 22–32)
Chloride: 97 mmol/L — ABNORMAL LOW (ref 101–111)
Creatinine, Ser: 4.21 mg/dL — ABNORMAL HIGH (ref 0.44–1.00)
GFR calc Af Amer: 12 mL/min — ABNORMAL LOW (ref >60)
GFR, EST NON AFRICAN AMERICAN: 10 mL/min — AB (ref >60)
GLUCOSE: 88 mg/dL (ref 65–99)
Potassium: 3.3 mmol/L — ABNORMAL LOW (ref 3.5–5.1)
Sodium: 142 mmol/L (ref 135–145)

## 2016-02-03 LAB — GLUCOSE, CAPILLARY
GLUCOSE-CAPILLARY: 88 mg/dL (ref 65–99)
Glucose-Capillary: 86 mg/dL (ref 65–99)
Glucose-Capillary: 178 mg/dL — ABNORMAL HIGH (ref 65–99)
Glucose-Capillary: 211 mg/dL — ABNORMAL HIGH (ref 65–99)

## 2016-02-03 LAB — POCT ACTIVATED CLOTTING TIME: Activated Clotting Time: 466 seconds

## 2016-02-03 SURGERY — CORONARY STENT INTERVENTION

## 2016-02-03 MED ORDER — SODIUM CHLORIDE 0.9% FLUSH: 3.0000 mL | INTRAVENOUS | Status: DC | PRN

## 2016-02-03 MED ORDER — LIDOCAINE HCL (PF) 1 % IJ SOLN
INTRAMUSCULAR | Status: AC
  Filled 2016-02-03: qty 30

## 2016-02-03 MED ORDER — METHYLPREDNISOLONE SODIUM SUCC 125 MG IJ SOLR
INTRAMUSCULAR | Status: AC
  Filled 2016-02-03: qty 2

## 2016-02-03 MED ORDER — NITROGLYCERIN 1 MG/10 ML FOR IR/CATH LAB
INTRA_ARTERIAL | Status: DC | PRN
  Administered 2016-02-03 (×3): 200 ug via INTRACORONARY

## 2016-02-03 MED ORDER — MIDODRINE HCL 5 MG PO TABS
10.0000 mg | ORAL_TABLET | ORAL | Status: DC
Start: 2016-02-03 — End: 2016-02-03

## 2016-02-03 MED ORDER — HEPARIN (PORCINE) IN NACL 2-0.9 UNIT/ML-% IJ SOLN
INTRAMUSCULAR | Status: AC
  Filled 2016-02-03: qty 1000

## 2016-02-03 MED ORDER — NITROGLYCERIN 1 MG/10 ML FOR IR/CATH LAB
INTRA_ARTERIAL | Status: AC
  Filled 2016-02-03: qty 10

## 2016-02-03 MED ORDER — METHYLPREDNISOLONE SODIUM SUCC 125 MG IJ SOLR
INTRAMUSCULAR | Status: DC | PRN
  Administered 2016-02-03: 125 mg via INTRAVENOUS

## 2016-02-03 MED ORDER — IOHEXOL 350 MG/ML SOLN
INTRAVENOUS | Status: DC | PRN
  Administered 2016-02-03: 205 mL via INTRA_ARTERIAL

## 2016-02-03 MED ORDER — LANTHANUM CARBONATE 500 MG PO CHEW
1000.0000 mg | CHEWABLE_TABLET | Freq: Three times a day (TID) | ORAL | Status: DC
  Administered 2016-02-03 – 2016-02-04 (×3): 1000 mg via ORAL
  Filled 2016-02-03 (×4): qty 2

## 2016-02-03 MED ORDER — DIPHENHYDRAMINE HCL 50 MG/ML IJ SOLN
INTRAMUSCULAR | Status: DC | PRN
  Administered 2016-02-03: 12.5 mg via INTRAVENOUS

## 2016-02-03 MED ORDER — PROMETHAZINE HCL 25 MG PO TABS: 25.0000 mg | ORAL_TABLET | Freq: Four times a day (QID) | ORAL | Status: DC | PRN

## 2016-02-03 MED ORDER — BIVALIRUDIN 250 MG IV SOLR
INTRAVENOUS | Status: AC
  Filled 2016-02-03: qty 250

## 2016-02-03 MED ORDER — DOCUSATE SODIUM 100 MG PO CAPS: 100.0000 mg | ORAL_CAPSULE | Freq: Every day | ORAL | Status: DC | PRN

## 2016-02-03 MED ORDER — PANTOPRAZOLE SODIUM 40 MG PO TBEC
40.0000 mg | DELAYED_RELEASE_TABLET | Freq: Every day | ORAL | Status: DC
  Administered 2016-02-03 – 2016-02-04 (×2): 40 mg via ORAL
  Filled 2016-02-03 (×2): qty 1

## 2016-02-03 MED ORDER — CLOPIDOGREL BISULFATE 75 MG PO TABS: 75.0000 mg | ORAL_TABLET | Freq: Every day | ORAL | Status: DC

## 2016-02-03 MED ORDER — SODIUM CHLORIDE 0.9% FLUSH
3.0000 mL | Freq: Two times a day (BID) | INTRAVENOUS | Status: DC
  Administered 2016-02-04: 3 mL via INTRAVENOUS

## 2016-02-03 MED ORDER — ASPIRIN EC 81 MG PO TBEC
81.0000 mg | DELAYED_RELEASE_TABLET | Freq: Every day | ORAL | Status: DC
  Administered 2016-02-04: 13:00:00 81 mg via ORAL
  Filled 2016-02-03: qty 1

## 2016-02-03 MED ORDER — CLOPIDOGREL BISULFATE 75 MG PO TABS
75.0000 mg | ORAL_TABLET | Freq: Every day | ORAL | Status: DC
Start: 2016-02-04 — End: 2016-02-04

## 2016-02-03 MED ORDER — ASPIRIN 81 MG PO CHEW
81.0000 mg | CHEWABLE_TABLET | ORAL | Status: AC
  Administered 2016-02-03: 81 mg via ORAL

## 2016-02-03 MED ORDER — FLUTICASONE PROPIONATE 50 MCG/ACT NA SUSP: 1.0000 | Freq: Every day | NASAL | Status: DC | PRN

## 2016-02-03 MED ORDER — CALCIUM CARBONATE 1250 (500 CA) MG PO TABS
1000.0000 | ORAL_TABLET | Freq: Three times a day (TID) | ORAL | Status: DC
  Administered 2016-02-03: 16:00:00 500000 mg via ORAL
  Filled 2016-02-03 (×2): qty 1000

## 2016-02-03 MED ORDER — SODIUM CHLORIDE 0.9 % IV SOLN
250.0000 mg | INTRAVENOUS | Status: DC | PRN
  Administered 2016-02-03: 0.25 mg/kg/h via INTRAVENOUS

## 2016-02-03 MED ORDER — CLOPIDOGREL BISULFATE 75 MG PO TABS
ORAL_TABLET | ORAL | Status: AC
  Administered 2016-02-03: 150 mg via ORAL
  Filled 2016-02-03: qty 2

## 2016-02-03 MED ORDER — ACETAMINOPHEN 325 MG PO TABS
650.0000 mg | ORAL_TABLET | ORAL | Status: DC | PRN
  Administered 2016-02-03: 650 mg via ORAL
  Filled 2016-02-03: qty 2

## 2016-02-03 MED ORDER — SODIUM CHLORIDE 0.9% FLUSH
3.0000 mL | Freq: Two times a day (BID) | INTRAVENOUS | Status: DC
  Administered 2016-02-03: 15:00:00 3 mL via INTRAVENOUS

## 2016-02-03 MED ORDER — LIDOCAINE HCL (PF) 1 % IJ SOLN
INTRAMUSCULAR | Status: DC | PRN
  Administered 2016-02-03: 20 mL via INTRADERMAL

## 2016-02-03 MED ORDER — ANGIOPLASTY BOOK
Freq: Once | Status: AC
  Administered 2016-02-03: 21:00:00
  Filled 2016-02-03: qty 1

## 2016-02-03 MED ORDER — ONDANSETRON HCL 4 MG/2ML IJ SOLN: 4.0000 mg | Freq: Four times a day (QID) | INTRAMUSCULAR | Status: DC | PRN

## 2016-02-03 MED ORDER — LORATADINE 10 MG PO TABS
10.0000 mg | ORAL_TABLET | Freq: Every day | ORAL | Status: DC
  Administered 2016-02-03 – 2016-02-04 (×2): 10 mg via ORAL
  Filled 2016-02-03 (×2): qty 1

## 2016-02-03 MED ORDER — SODIUM CHLORIDE 0.9 % IV SOLN: 250.0000 mL | INTRAVENOUS | Status: DC | PRN

## 2016-02-03 MED ORDER — OXYCODONE-ACETAMINOPHEN 5-325 MG PO TABS: 1.0000 | ORAL_TABLET | ORAL | Status: DC | PRN

## 2016-02-03 MED ORDER — ATORVASTATIN CALCIUM 40 MG PO TABS
40.0000 mg | ORAL_TABLET | Freq: Every morning | ORAL | Status: DC
  Administered 2016-02-03 – 2016-02-04 (×2): 40 mg via ORAL
  Filled 2016-02-03 (×2): qty 1

## 2016-02-03 MED ORDER — DIPHENHYDRAMINE HCL 50 MG/ML IJ SOLN
INTRAMUSCULAR | Status: AC
  Filled 2016-02-03: qty 1

## 2016-02-03 MED ORDER — FAMOTIDINE IN NACL 20-0.9 MG/50ML-% IV SOLN
INTRAVENOUS | Status: AC
  Filled 2016-02-03: qty 50

## 2016-02-03 MED ORDER — ONDANSETRON HCL 4 MG PO TABS: 4.0000 mg | ORAL_TABLET | Freq: Four times a day (QID) | ORAL | Status: DC | PRN

## 2016-02-03 MED ORDER — ASPIRIN 81 MG PO CHEW: 81.0000 mg | CHEWABLE_TABLET | Freq: Every day | ORAL | Status: DC

## 2016-02-03 MED ORDER — MIDODRINE HCL 5 MG PO TABS: 10.0000 mg | ORAL_TABLET | ORAL | Status: DC

## 2016-02-03 MED ORDER — BENZONATATE 100 MG PO CAPS
200.0000 mg | ORAL_CAPSULE | Freq: Three times a day (TID) | ORAL | Status: DC | PRN
Start: 2016-02-03 — End: 2016-02-04
  Administered 2016-02-03: 16:00:00 200 mg via ORAL
  Filled 2016-02-03: qty 2

## 2016-02-03 MED ORDER — FENTANYL CITRATE (PF) 100 MCG/2ML IJ SOLN
INTRAMUSCULAR | Status: AC
  Filled 2016-02-03: qty 2

## 2016-02-03 MED ORDER — MIDAZOLAM HCL 2 MG/2ML IJ SOLN
INTRAMUSCULAR | Status: DC | PRN
  Administered 2016-02-03: 0.5 mg via INTRAVENOUS

## 2016-02-03 MED ORDER — SODIUM CHLORIDE 0.9 % IV SOLN
INTRAVENOUS | Status: DC
  Administered 2016-02-03: 10:00:00 via INTRAVENOUS

## 2016-02-03 MED ORDER — MIDODRINE HCL 5 MG PO TABS
10.0000 mg | ORAL_TABLET | ORAL | Status: DC
  Administered 2016-02-03: 21:00:00 10 mg via ORAL
  Filled 2016-02-03: qty 2

## 2016-02-03 MED ORDER — RENA-VITE PO TABS
1.0000 | ORAL_TABLET | Freq: Every day | ORAL | Status: DC
  Administered 2016-02-03: 1 via ORAL
  Filled 2016-02-03: qty 1

## 2016-02-03 MED ORDER — HYDRALAZINE HCL 20 MG/ML IJ SOLN
10.0000 mg | Freq: Four times a day (QID) | INTRAMUSCULAR | Status: DC | PRN
  Administered 2016-02-03: 10 mg via INTRAVENOUS
  Filled 2016-02-03: qty 1

## 2016-02-03 MED ORDER — ASPIRIN 81 MG PO CHEW
CHEWABLE_TABLET | ORAL | Status: AC
  Administered 2016-02-03: 81 mg via ORAL
  Filled 2016-02-03: qty 1

## 2016-02-03 MED ORDER — ACETAMINOPHEN 500 MG PO TABS: 500.0000 mg | ORAL_TABLET | Freq: Four times a day (QID) | ORAL | Status: DC | PRN

## 2016-02-03 MED ORDER — MIDAZOLAM HCL 2 MG/2ML IJ SOLN
INTRAMUSCULAR | Status: AC
  Filled 2016-02-03: qty 2

## 2016-02-03 MED ORDER — HEPARIN (PORCINE) IN NACL 2-0.9 UNIT/ML-% IJ SOLN
INTRAMUSCULAR | Status: DC | PRN
  Administered 2016-02-03: 1000 mL

## 2016-02-03 MED ORDER — FENTANYL CITRATE (PF) 100 MCG/2ML IJ SOLN
INTRAMUSCULAR | Status: DC | PRN
  Administered 2016-02-03: 25 ug via INTRAVENOUS

## 2016-02-03 MED ORDER — SODIUM CHLORIDE 0.9% FLUSH: 3.0000 mL | Freq: Two times a day (BID) | INTRAVENOUS | Status: DC

## 2016-02-03 MED ORDER — BIVALIRUDIN BOLUS VIA INFUSION - CUPID
INTRAVENOUS | Status: DC | PRN
Start: 2016-02-03 — End: 2016-02-03
  Administered 2016-02-03: 56.475 mg via INTRAVENOUS

## 2016-02-03 MED ORDER — CLOPIDOGREL BISULFATE 75 MG PO TABS
150.0000 mg | ORAL_TABLET | ORAL | Status: AC
  Administered 2016-02-03: 150 mg via ORAL

## 2016-02-03 MED ORDER — FAMOTIDINE IN NACL 20-0.9 MG/50ML-% IV SOLN
INTRAVENOUS | Status: DC | PRN
  Administered 2016-02-03: 20 mg via INTRAVENOUS

## 2016-02-03 SURGICAL SUPPLY — 27 items
BALLN ANGIOSCULPT RX 2.5X10 (BALLOONS) ×3
BALLN EMERGE MR 3.0X12 (BALLOONS) ×3
BALLN TREK RX 2.25X15 (BALLOONS) ×3
BALLN ~~LOC~~ EMERGE MR 2.75X20 (BALLOONS) ×6
BALLN ~~LOC~~ EMERGE MR 2.75X6 (BALLOONS) ×3
BALLN ~~LOC~~ EUPHORA RX 3.25X12 (BALLOONS) ×3
BALLOON ANGIOSCULPT RX 2.5X10 (BALLOONS) ×1 IMPLANT
BALLOON EMERGE MR 3.0X12 (BALLOONS) ×1 IMPLANT
BALLOON TREK RX 2.25X15 (BALLOONS) ×1 IMPLANT
BALLOON ~~LOC~~ EMERGE MR 2.75X20 (BALLOONS) ×2 IMPLANT
BALLOON ~~LOC~~ EMERGE MR 2.75X6 (BALLOONS) ×1 IMPLANT
BALLOON ~~LOC~~ EUPHORA RX 3.25X12 (BALLOONS) ×1 IMPLANT
CATH VISTA GUIDE 6FR XBLAD3.5 (CATHETERS) ×3 IMPLANT
DEVICE WIRE ANGIOSEAL 6FR (Vascular Products) ×3 IMPLANT
GUIDE CATH RUNWAY 6FR FR4 (CATHETERS) ×3 IMPLANT
KIT ENCORE 26 ADVANTAGE (KITS) ×3 IMPLANT
KIT HEART LEFT (KITS) ×3 IMPLANT
PACK CARDIAC CATHETERIZATION (CUSTOM PROCEDURE TRAY) ×3 IMPLANT
SHEATH PINNACLE 6F 10CM (SHEATH) ×3 IMPLANT
STENT PROMUS PREM MR 2.5X12 (Permanent Stent) ×3 IMPLANT
STENT PROMUS PREM MR 2.5X20 (Permanent Stent) ×3 IMPLANT
STENT PROMUS PREM MR 2.5X32 (Permanent Stent) ×3 IMPLANT
STENT PROMUS PREM MR 3.0X16 (Permanent Stent) ×3 IMPLANT
TRANSDUCER W/STOPCOCK (MISCELLANEOUS) ×3 IMPLANT
TUBING CIL FLEX 10 FLL-RA (TUBING) ×3 IMPLANT
WIRE ASAHI PROWATER 180CM (WIRE) ×6 IMPLANT
WIRE RUNTHROUGH .014X180CM (WIRE) ×3 IMPLANT

## 2016-02-03 NOTE — Progress Notes (Signed)
Lab orders clarified/ BMET needed only

## 2016-02-03 NOTE — Interval H&P Note (Signed)
Cath Lab Visit (complete for each Cath Lab visit)  Clinical Evaluation Leading to the Procedure:   ACS: No.  Non-ACS:    Anginal Classification: CCS III  Anti-ischemic medical therapy: Maximal Therapy (2 or more classes of medications)  Non-Invasive Test Results: No non-invasive testing performed  Prior CABG: No previous CABG      History and Physical Interval Note:  02/03/2016 10:11 AM  [name] [name]  has presented today for surgery, with the diagnosis of cad  The various methods of treatment have been discussed with the patient and family. After consideration of risks, benefits and other options for treatment, the patient has consented to  Procedure(s): Coronary Stent Intervention (N/A) as a surgical intervention .  The patient's history has been reviewed, patient examined, no change in status, stable for surgery.  I have reviewed the patient's chart and labs.  Questions were answered to the patient's satisfaction.     [name]

## 2016-02-03 NOTE — H&P (View-Only) (Signed)
Cardiology Office Note   Date:  01/30/2016   ID:  [name] [name], DOB Apr 25, 1951, MRN [id number]  PCP:  [name] Houseman, MD  Cardiologist:  [name] Grooms, MD   Chief Complaint  Patient presents with  . Coronary Artery Disease      History of Present Illness: [name] [name] is a 65 y.o. female who presents for Follow-up of multivessel CAD, surgical turn down, limiting dyspnea felt to be an anginal equivalent, and end-stage renal disease.  The patient has seen [name] [name] on multiple occasions to determine if she would benefit from surgical revascularization. She was ultimately turned down because of debility and absence of venous conduits that would allow revascularization. She was discussed at half conference and the opinion was that she would benefit from multivessel PCI. Her symptoms are limiting dyspnea on exertion. She is in rehabilitation at her nursing facility. She dialyzes 3 times per week. She denies chest discomfort.  Cardiac cath in June 2016 demonstrated high-grade segmental LAD stenosis, relatively focal severe stenosis in the proximal circumflex, and distal RCA high-grade disease. At that time the sites would be amenable to PCI but with her diagnosis of diabetes surgical revascularization was felt to be the optimal treatment for the best long-term outcome.  Past Medical History  Diagnosis Date  . Hearing difficulty     Mild   . Diabetes mellitus ([place])   . Coronary artery disease     a. cath 6/16 Severe multivessel cad with diffuse significant segmental LAD stenosis, 95% stenosis in a large second diagonal, 75% ostial stenosis in the smaller first diagonal, 95% mid and 65% distal circumflex, 95% ostial/proximal PDA, and 40-50% left main ostial disease. Med rx initially, to be re-evaluated by CVTS.  Marland Kitchen ESRD (end stage renal disease) on dialysis ([place])        . Hypertension   . Ischemic cardiomyopathy     a. Echo 04/2015: EF 35%. Cath same time - 45%.  .  Syncope   . Hyponatremia   . Anemia   . Thrombocytopenia ([place])   . Memory loss   . Hyperlipidemia     Past Surgical History  Procedure Laterality Date  . 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 Esophagogastroduodenoscopy Left 02/20/2013    Procedure: ESOPHAGOGASTRODUODENOSCOPY (EGD);  Surgeon: Cleotis Nipper, MD;  Location: [facility] ENDOSCOPY;  Service: Endoscopy;  Laterality: Left;  . Av fistula placement Left 03/07/2013    Procedure: ARTERIOVENOUS (AV) FISTULA CREATION;  Surgeon: Elam Dutch, MD;  Location: [facility] OR;  Service: Vascular;  Laterality: Left;  left brachiocephalic AVF  . Cardiac catheterization N/A 04/25/2015    Procedure: Left Heart Cath and Coronary Angiography;  Surgeon: Belva Crome, MD;  Location: [facility] CV LAB;  Service: Cardiovascular;  Laterality: N/A;     Current Outpatient Prescriptions  Medication Sig Dispense Refill  . acetaminophen (TYLENOL) 500 MG tablet Take 500 mg by mouth every 6 (six) hours as needed for headache.    Marland Kitchen aspirin 81 MG tablet Take 81 mg by mouth daily.    Marland Kitchen atorvastatin (LIPITOR) 40 MG tablet Take 40 mg by mouth every morning.    . benzonatate (TESSALON) 200 MG capsule Take 1 capsule (200 mg total) by mouth 3 (three) times daily as needed for cough. 20 capsule 0  . calcium carbonate (OS-CAL - DOSED IN MG OF ELEMENTAL CALCIUM) 1250 (500 CA) MG tablet Take two (2) tablets by mouth three (3) times  daily with meals and take two (2) tablets by mouth with snacks.    . docusate sodium (COLACE) 100 MG capsule Take 100 mg by mouth daily as needed for mild constipation or moderate constipation.    . fluticasone (FLONASE) 50 MCG/ACT nasal spray Place 1 spray into both nostrils daily as needed for allergies or rhinitis.    Marland Kitchen lanthanum (FOSRENOL) 1000 MG chewable tablet Chew 1,000 mg by mouth 3 (three) times daily with meals.    . lidocaine-prilocaine (EMLA) cream APPLY SMALL AMOUNT TO ACCESS SITE 1 TO 2 HOURS BEFORE DIALYSIS.  COVER WITH OCCLUSIVE DRESSING (SARAN WRAP).  12  . loratadine (CLARITIN) 10 MG tablet Take 1 tablet (10 mg total) by mouth daily. 30 tablet 11  . midodrine (PROAMATINE) 10 MG tablet Take one (1) tablet (10 mg total) by mouth at bedtime on Mondays, Wednesdays, and Fridays. Take one (1) tablet (10 mg total) by mouth pre-dialysis on Tuesdays, Thursdays, and Saturdays.  3  . multivitamin (RENA-VIT) TABS tablet Take 1 tablet by mouth at bedtime. 90 tablet 3  . ondansetron (ZOFRAN) 4 MG tablet Take 4 mg by mouth every 6 (six) hours as needed for nausea or vomiting.    . pantoprazole (PROTONIX) 40 MG tablet TAKE 1 TABLET BY MOUTH DAILY 30 tablet 0  . promethazine (PHENERGAN) 25 MG tablet Take 25 mg by mouth every 6 (six) hours as needed for nausea or vomiting.     No current facility-administered medications for this visit.    Allergies:   Aleve; Motrin; and Nsaids    Social History:  The patient  reports that she has never smoked. She has never used smokeless tobacco. She reports that she does not drink alcohol or use illicit drugs.   Family History:  The patient's family history includes Bone cancer in her father; Throat cancer in her father.    ROS:  Please see the history of present illness.   Otherwise, review of systems are positive for weakness, limiting dyspnea, dizziness, and forgetfulness..   All other systems are reviewed and negative.    PHYSICAL EXAM: VS:  BP 156/88 mmHg  Pulse 86  Ht 5' 4.5" (1.638 m)  Wt 171 lb 6.4 oz (77.747 kg)  BMI 28.98 kg/m2 , BMI Body mass index is 28.98 kg/(m^2). GEN: Well nourished, well developed, in no acute distress HEENT: normal Neck: no JVD, carotid bruits, or masses Cardiac: RRR.  There is 2/6 systolic Murmur of mitral regurgitation heard at the apex radiating into the axilla. No rub or gallop. There is no edema. Loud bruit is heard over the fistula in her left forearm. Respiratory:  clear to auscultation bilaterally, normal work of  breathing. GI: soft, nontender, nondistended, + BS MS: no deformity or atrophy Skin: warm and dry, no rash Neuro:  Strength and sensation are intact Psych: euthymic mood, full affect   EKG:  EKG is not ordered today.    Recent Labs: 04/25/2015: TSH 1.974 12/02/2015: ALT 10*; B Natriuretic Peptide >4500.0* 12/03/2015: BUN 31*; Creatinine, Ser 5.23*; Hemoglobin 10.4*; Platelets 199; Potassium 4.2; Sodium 132*    Lipid Panel    Component Value Date/Time   CHOL 93* 09/23/2015 0915   TRIG 81 09/23/2015 0915   HDL 34* 09/23/2015 0915   CHOLHDL 2.7 09/23/2015 0915   VLDL 16 09/23/2015 0915   LDLCALC 43 09/23/2015 0915      Wt Readings from Last 3 Encounters:  01/30/16 171 lb 6.4 oz (77.747 kg)  01/01/16 177 lb (80.287 kg)  12/20/15 177 lb (80.287 kg)      Other studies Reviewed: Additional studies/ records that were reviewed today include: Digital angiography was reviewed with the patient and her daughter.. The findings include three-vessel disease with depressed LV function was discussed..    ASSESSMENT AND PLAN:  1. CAD in native artery Multivessel CAD involving LAD, circumflex, and distal RCA. Surgical turned down due to multiple medical comorbidities, absence of adequate venous conduits, end-stage renal disease, and fragility.  The patient has significant dyspnea on exertion that is an ischemic equivalent, New York Heart Association class III. We are unable to use much in way of medical anti-ischemic therapy because of hemodynamic instability on dialysis.  Plan is for high risk multivessel PCI (CHIP). Procedure and risks were discussed in detail with the patient and her sister.  2. ESRD (end stage renal disease) ([place]) Procedure were performed on a nondialysis day  3. Cardiomyopathy, ischemic/chronic combined systolic and diastolic heart failure Last LVEF in the 35-40% range at the time of cath in June 2016     Current medicines are reviewed at length with the  patient today.  The patient has the following concerns regarding medicines: *Dual antiplatelet therapy including Plavix 75 mg per day.  The following changes/actions have been instituted:    Plavix 75 mg daily  Schedule PCI, multivessel with extended Cath Lab slot timeThe patient was counseled to undergo left heart catheterization, coronary angiography, and percutaneous coronary intervention with stent implantation. The procedural risks and benefits were discussed in detail. The risks discussed included death, stroke, myocardial infarction, life-threatening bleeding, limb ischemia, kidney injury, allergy, and possible emergency cardiac surgery. The risk of these significant complications were estimated to occur less than 1% of the time. After discussion, the patient has agreed to proceed.  We spent great detail discussing her nonsurgical status. Therefore we have difficulty she would not be an emergency CABG candidate. Both she and the sister understand this potential problem.  Labs/ tests ordered today include:  No orders of the defined types were placed in this encounter.     Disposition:   FU with HS in 4 weeks  Signed, [name] Grooms, MD  01/30/2016 10:36 AM    [facility] [address], [place], [place]  [address] Phone: [phone]; Fax: [phone]

## 2016-02-03 NOTE — Consult Note (Signed)
Renal Service Consult Note [facility]  [name] [name] 02/03/2016 Roney Jaffe D Requesting Physician:  Dr Linard Millers  Reason for Consult:  ESRD patient s/p PCI for severe 3VCAD HPI: The patient is a 65 y.o. year-old with hx of HTN, DM and ischemic CM. She was diagnosed last year wthi severe 3VCAD but was not a surgical candidate due to sig deconditioning and debility.  Has improved now but still not surgical candidate, presenting for multi-vessel PCI.  PCI was done today with 3 DE stents overlapping to LAD, and one DE stent each to LCx and PDA.  Had reaction to contrast rx with steroid/ benadryl/ O2.  Post procedure is alert, no complaints. Asked to see for HD.  No recent HD issues, doesn't miss much HD.      ROS  denies CP  no joint pain   no HA  no blurry vision  no rash  no diarrhea  no nausea/ vomiting  no dysuria  no difficulty voiding  no change in urine color    Past Medical History  Past Medical History  Diagnosis Date  . Hearing difficulty     Mild   . Diabetes mellitus ([place])   . Coronary artery disease     a. cath 6/16 Severe multivessel cad with diffuse significant segmental LAD stenosis, 95% stenosis in a large second diagonal, 75% ostial stenosis in the smaller first diagonal, 95% mid and 65% distal circumflex, 95% ostial/proximal PDA, and 40-50% left main ostial disease. Med rx initially, to be re-evaluated by CVTS.  Marland Kitchen ESRD (end stage renal disease) on dialysis ([place])        . Hypertension   . Ischemic cardiomyopathy     a. Echo 04/2015: EF 35%. Cath same time - 45%.  . Syncope   . Hyponatremia   . Anemia   . Thrombocytopenia ([place])   . Memory loss   . Hyperlipidemia    Past Surgical History  Past Surgical History  Procedure Laterality Date  . 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 Esophagogastroduodenoscopy Left 02/20/2013    Procedure: ESOPHAGOGASTRODUODENOSCOPY (EGD);  Surgeon: Cleotis Nipper, MD;   Location: [facility] ENDOSCOPY;  Service: Endoscopy;  Laterality: Left;  . Av fistula placement Left 03/07/2013    Procedure: ARTERIOVENOUS (AV) FISTULA CREATION;  Surgeon: Elam Dutch, MD;  Location: [facility] OR;  Service: Vascular;  Laterality: Left;  left brachiocephalic AVF  . Cardiac catheterization N/A 04/25/2015    Procedure: Left Heart Cath and Coronary Angiography;  Surgeon: Belva Crome, MD;  Location: [place] CV LAB;  Service: Cardiovascular;  Laterality: N/A;  . Coronary stent placement  02/03/2016    LAD X2     CIRCUMFLEX       RCA    Family History  Family History  Problem Relation Age of Onset  . Heart disease    . Diabetes    . Stroke    . Bone cancer Father     Of jaws   . Throat cancer Father    Social History  reports that she has never smoked. She has never used smokeless tobacco. She reports that she does not drink alcohol or use illicit drugs. Allergies  Allergies  Allergen Reactions  . Aleve [Naproxen Sodium] Other (See Comments)    Due to kidneys.  . Motrin [Ibuprofen] Other (See Comments)    Due to kidneys.  . Nsaids Other (See Comments)    KIDNEY FAILURE AND ONLY HAS PARTIAL  LEFT KIDNEY REMAINING (RIGHT ONE TAKEN OUT)  . Tape     Please use "paper" tape   Home medications Prior to Admission medications   Medication Sig Start Date End Date Taking? Authorizing Provider  acetaminophen (TYLENOL) 500 MG tablet Take 500 mg by mouth every 6 (six) hours as needed for headache.   Yes Historical Provider, MD  aspirin 81 MG tablet Take 81 mg by mouth daily.   Yes Historical Provider, MD  atorvastatin (LIPITOR) 40 MG tablet Take 40 mg by mouth every morning.   Yes Historical Provider, MD  benzonatate (TESSALON) 200 MG capsule Take 1 capsule (200 mg total) by mouth 3 (three) times daily as needed for cough. 01/10/16  Yes Smiley Houseman, MD  calcium carbonate (OS-CAL - DOSED IN MG OF ELEMENTAL CALCIUM) 1250 (500 CA) MG tablet Take two (2) tablets by mouth three (3)  times daily with meals and take two (2) tablets by mouth with snacks.   Yes Historical Provider, MD  clopidogrel (PLAVIX) 75 MG tablet Take 1 tablet (75 mg total) by mouth daily. 01/30/16  Yes Belva Crome, MD  docusate sodium (COLACE) 100 MG capsule Take 100 mg by mouth daily as needed for mild constipation or moderate constipation.   Yes Historical Provider, MD  fluticasone (FLONASE) 50 MCG/ACT nasal spray Place 1 spray into both nostrils daily as needed for allergies or rhinitis.   Yes Historical Provider, MD  lanthanum (FOSRENOL) 1000 MG chewable tablet Chew 1,000 mg by mouth 3 (three) times daily with meals.   Yes Historical Provider, MD  lidocaine-prilocaine (EMLA) cream APPLY SMALL AMOUNT TO ACCESS SITE 1 TO 2 HOURS BEFORE DIALYSIS. COVER WITH OCCLUSIVE DRESSING (SARAN WRAP). 12/26/15  Yes Historical Provider, MD  loratadine (CLARITIN) 10 MG tablet Take 1 tablet (10 mg total) by mouth daily. Patient taking differently: Take 10 mg by mouth daily as needed for allergies.  12/11/15  Yes Vivi Barrack, MD  midodrine (PROAMATINE) 10 MG tablet Take one (1) tablet (10 mg total) by mouth at bedtime on Mondays, Wednesdays, and Fridays. Take one (1) tablet (10 mg total) by mouth pre-dialysis on Tuesdays, Thursdays, and Saturdays. 11/18/15  Yes Historical Provider, MD  multivitamin (RENA-VIT) TABS tablet Take 1 tablet by mouth at bedtime. 09/04/14  Yes Leone Haven, MD  ondansetron (ZOFRAN) 4 MG tablet Take 4 mg by mouth every 6 (six) hours as needed for nausea or vomiting.   Yes Historical Provider, MD  pantoprazole (PROTONIX) 40 MG tablet TAKE 1 TABLET BY MOUTH DAILY 11/26/15  Yes Smiley Houseman, MD  promethazine (PHENERGAN) 25 MG tablet Take 25 mg by mouth every 6 (six) hours as needed for nausea or vomiting.   Yes Historical Provider, MD   Liver Function Tests No results for input(s): AST, ALT, ALKPHOS, BILITOT, PROT, ALBUMIN in the last 168 hours. No results for input(s): LIPASE, AMYLASE in the  last 168 hours. CBC  Recent Labs Lab 01/30/16 1049  WBC 5.3  NEUTROABS 3.7  HGB 12.1  HCT 36.0  MCV 93.8  PLT 123456   Basic Metabolic Panel  Recent Labs Lab 01/30/16 1049 02/03/16 0859  NA 136 142  K 3.4* 3.3*  CL 94* 97*  CO2 29 30  GLUCOSE 120* 88  BUN 20 18  CREATININE 4.58* 4.21*  CALCIUM 9.0 9.7    Filed Vitals:   02/03/16 1228 02/03/16 1233 02/03/16 1300 02/03/16 1301  BP: 165/84 166/80 193/93 186/83  Pulse: 77 0 65 62  Temp:  97.5 F (36.4 C)   TempSrc:   Oral   Resp: 21 9 19 16   Height:      Weight:      SpO2: 98% 91% 94% 94%   Exam Alert, chron ill-appearing, pleasant, occ cough, no distress No rash, cyanosis or gangrene Sclera anicteric, throat clear  No jvd or bruits Chest clear bilat RRR no MRG Abd soft ntnd no mass or ascites +bs GU deferred MS no joint effusions or deformity Ext no LE edema / wounds/ ulcers Neuro is alert, Ox 3 , nf LUA AVF +bruit    Dialysis: [facility] TTS   4h  2/2 bath  75kg  Heparin 2600  Assessment: 1 CAD s/p PCI - today, w PCI to LAD, LCx and PDA 2 Contrast allergy - stable now 3 ESRD HD TTS 4 Volume is euvolemic on exam, at dry wt 5 BP - takes midodrine pre HD, will hold here with higher BP's. No anti-HTNsive's at home 6 MBD cont binders    Plan - HD first shift tomorrow, UF to dry wt  Kelly Splinter MD [place] pager [phone]    cell [phone] 02/03/2016, 4:28 PM

## 2016-02-03 NOTE — Progress Notes (Signed)
Notified Dr Tamala Julian k this am was 3.3, no new orders tks Jiayi Lengacher

## 2016-02-04 ENCOUNTER — Encounter (HOSPITAL_COMMUNITY): Payer: Self-pay | Admitting: Nurse Practitioner

## 2016-02-04 DIAGNOSIS — E1122 Type 2 diabetes mellitus with diabetic chronic kidney disease: Secondary | ICD-10-CM | POA: Diagnosis not present

## 2016-02-04 DIAGNOSIS — I200 Unstable angina: Secondary | ICD-10-CM | POA: Diagnosis not present

## 2016-02-04 DIAGNOSIS — E785 Hyperlipidemia, unspecified: Secondary | ICD-10-CM

## 2016-02-04 DIAGNOSIS — N186 End stage renal disease: Secondary | ICD-10-CM

## 2016-02-04 DIAGNOSIS — E669 Obesity, unspecified: Secondary | ICD-10-CM

## 2016-02-04 DIAGNOSIS — E119 Type 2 diabetes mellitus without complications: Secondary | ICD-10-CM | POA: Diagnosis not present

## 2016-02-04 DIAGNOSIS — Z992 Dependence on renal dialysis: Secondary | ICD-10-CM | POA: Diagnosis not present

## 2016-02-04 DIAGNOSIS — I25110 Atherosclerotic heart disease of native coronary artery with unstable angina pectoris: Secondary | ICD-10-CM

## 2016-02-04 DIAGNOSIS — I25118 Atherosclerotic heart disease of native coronary artery with other forms of angina pectoris: Secondary | ICD-10-CM | POA: Diagnosis not present

## 2016-02-04 DIAGNOSIS — I132 Hypertensive heart and chronic kidney disease with heart failure and with stage 5 chronic kidney disease, or end stage renal disease: Secondary | ICD-10-CM | POA: Diagnosis not present

## 2016-02-04 LAB — CBC
HCT: 35.1 % — ABNORMAL LOW (ref 36.0–46.0)
Hemoglobin: 11 g/dL — ABNORMAL LOW (ref 12.0–15.0)
MCH: 30.1 pg (ref 26.0–34.0)
MCHC: 31.3 g/dL (ref 30.0–36.0)
MCV: 95.9 fL (ref 78.0–100.0)
PLATELETS: 166 10*3/uL (ref 150–400)
RBC: 3.66 MIL/uL — AB (ref 3.87–5.11)
RDW: 16 % — ABNORMAL HIGH (ref 11.5–15.5)
WBC: 5.6 10*3/uL (ref 4.0–10.5)

## 2016-02-04 LAB — BASIC METABOLIC PANEL
Anion gap: 12 (ref 5–15)
BUN: 27 mg/dL — ABNORMAL HIGH (ref 6–20)
CALCIUM: 9.3 mg/dL (ref 8.9–10.3)
CO2: 29 mmol/L (ref 22–32)
CREATININE: 4.93 mg/dL — AB (ref 0.44–1.00)
Chloride: 96 mmol/L — ABNORMAL LOW (ref 101–111)
GFR calc non Af Amer: 8 mL/min — ABNORMAL LOW (ref >60)
GFR, EST AFRICAN AMERICAN: 10 mL/min — AB (ref >60)
Glucose, Bld: 191 mg/dL — ABNORMAL HIGH (ref 65–99)
Potassium: 3.8 mmol/L (ref 3.5–5.1)
SODIUM: 137 mmol/L (ref 135–145)

## 2016-02-04 LAB — GLUCOSE, CAPILLARY
Glucose-Capillary: 152 mg/dL — ABNORMAL HIGH (ref 65–99)
Glucose-Capillary: 101 mg/dL — ABNORMAL HIGH (ref 65–99)

## 2016-02-04 LAB — PLATELET INHIBITION P2Y12: PLATELET FUNCTION P2Y12: 317 [PRU] (ref 194–418)

## 2016-02-04 MED ORDER — SODIUM CHLORIDE 0.9 % IV SOLN: 100.0000 mL | INTRAVENOUS | Status: DC | PRN

## 2016-02-04 MED ORDER — TICAGRELOR 90 MG PO TABS
180.0000 mg | ORAL_TABLET | ORAL | Status: AC
  Administered 2016-02-04: 180 mg via ORAL
  Filled 2016-02-04: qty 2

## 2016-02-04 MED ORDER — PENTAFLUOROPROP-TETRAFLUOROETH EX AERO
1.0000 "application " | INHALATION_SPRAY | CUTANEOUS | Status: DC | PRN
  Filled 2016-02-04: qty 30

## 2016-02-04 MED ORDER — TICAGRELOR 90 MG PO TABS: 90.0000 mg | ORAL_TABLET | Freq: Two times a day (BID) | ORAL | Status: DC

## 2016-02-04 MED ORDER — CALCIUM CARBONATE 1250 (500 CA) MG PO TABS
1000.0000 mg | ORAL_TABLET | Freq: Three times a day (TID) | ORAL | Status: DC
  Filled 2016-02-04 (×2): qty 2

## 2016-02-04 MED ORDER — HEPARIN SODIUM (PORCINE) 1000 UNIT/ML DIALYSIS
1000.0000 [IU] | INTRAMUSCULAR | Status: DC | PRN
Start: 2016-02-04 — End: 2016-02-04
  Filled 2016-02-04: qty 1

## 2016-02-04 MED ORDER — CALCIUM CARBONATE 1250 (500 CA) MG PO TABS
500.0000 mg | ORAL_TABLET | Freq: Three times a day (TID) | ORAL | Status: DC
  Filled 2016-02-04 (×2): qty 1

## 2016-02-04 MED ORDER — CALCIUM CARBONATE 1250 (500 CA) MG PO TABS
1000.0000 mg | ORAL_TABLET | Freq: Three times a day (TID) | ORAL | Status: DC
  Administered 2016-02-04 (×2): 1000 mg via ORAL
  Filled 2016-02-04 (×2): qty 2

## 2016-02-04 MED ORDER — NITROGLYCERIN 0.4 MG SL SUBL: 0.4000 mg | SUBLINGUAL_TABLET | SUBLINGUAL | Status: DC | PRN

## 2016-02-04 MED ORDER — LIDOCAINE-PRILOCAINE 2.5-2.5 % EX CREA
1.0000 "application " | TOPICAL_CREAM | CUTANEOUS | Status: DC | PRN
  Filled 2016-02-04: qty 5

## 2016-02-04 MED ORDER — ALTEPLASE 2 MG IJ SOLR: 2.0000 mg | Freq: Once | INTRAMUSCULAR | Status: DC | PRN

## 2016-02-04 MED ORDER — LIDOCAINE HCL (PF) 1 % IJ SOLN: 5.0000 mL | INTRAMUSCULAR | Status: DC | PRN

## 2016-02-04 NOTE — Progress Notes (Signed)
  [facility] Progress Note   Subjective: Eyes hurt, no other issues overnight  Filed Vitals:   02/04/16 0750 02/04/16 0802 02/04/16 0830 02/04/16 0900  BP: 173/98 164/88 151/84 146/75  Pulse: 62 66 65 62  Temp:      TempSrc:      Resp:      Height:      Weight:      SpO2:        Inpatient medications: . aspirin EC  81 mg Oral Daily  . atorvastatin  40 mg Oral q morning - 10a  . calcium carbonate  1,000 mg of elemental calcium Oral TID WC  . clopidogrel  75 mg Oral Daily  . lanthanum  1,000 mg Oral TID WC  . loratadine  10 mg Oral Daily  . midodrine  10 mg Oral Q T,Th,Sa-HD  . midodrine  10 mg Oral Once per day on Mon Wed Fri  . multivitamin  1 tablet Oral QHS  . pantoprazole  40 mg Oral Daily  . sodium chloride flush  3 mL Intravenous Q12H  . sodium chloride flush  3 mL Intravenous Q12H     sodium chloride, sodium chloride, sodium chloride, sodium chloride, acetaminophen, alteplase, benzonatate, docusate sodium, fluticasone, heparin, hydrALAZINE, lidocaine (PF), lidocaine-prilocaine, ondansetron, oxyCODONE-acetaminophen, pentafluoroprop-tetrafluoroeth, promethazine, sodium chloride flush, sodium chloride flush  Exam: Alert, no distress No jvd or bruits Chest clear bilat RRR no MRG Abd soft ntnd no mass or ascites +bs GU deferred MS no joint effusions or deformity Ext no LE edema / wounds/ ulcers Neuro is alert, Ox 3 , nf LUA AVF +bruit  Dialysis: [facility] TTS 4h 2/2 bath 75kg Heparin 2600   Assessment: 1 CAD s/p PCI to LAD, LCx and PDA 2 Contrast allergy - still some burning in her eyes 3 ESRD HD TTS 4 Volume is euvolemic on exam, at dry wt 5 BP - takes midodrine pre HD, will hold here with higher BP's. No anti-HTNsive's at home 6 MBD cont CaCO3 and fosrenol.  Ca levels prob a little bit high, recheck OP setting , dont' have albumin here  Plan - HD today. DC per prim svc   Kelly Splinter MD [facility] pager [phone]    cell  [phone] 02/04/2016, 9:13 AM    Recent Labs Lab 01/30/16 1049 02/03/16 0859 02/04/16 0330  NA 136 142 137  K 3.4* 3.3* 3.8  CL 94* 97* 96*  CO2 29 30 29   GLUCOSE 120* 88 191*  BUN 20 18 27*  CREATININE 4.58* 4.21* 4.93*  CALCIUM 9.0 9.7 9.3   No results for input(s): AST, ALT, ALKPHOS, BILITOT, PROT, ALBUMIN in the last 168 hours.  Recent Labs Lab 01/30/16 1049 02/04/16 0330  WBC 5.3 5.6  NEUTROABS 3.7  --   HGB 12.1 11.0*  HCT 36.0 35.1*  MCV 93.8 95.9  PLT 201 166

## 2016-02-04 NOTE — Discharge Summary (Signed)
Discharge Summary    Patient ID: [name] [name],  MRN: [id number], DOB/AGE: 04/21/1951 65 y.o.  Admit date: 02/03/2016 Discharge date: 02/04/2016  Primary Care Provider: Smiley Houseman Primary Cardiologist: Linard Millers, MD   Discharge Diagnoses    Principal Problem:   Unstable angina [facility])  **s/p PCI/DES x 2 to the LAD, x1 to the LCX and x1 to the distal RCA/RPDA.  Active Problems:   CAD (coronary artery disease), native coronary artery   End stage renal disease (H[facility])   Diabetes mellitus type 2 in obese ([facility])   Anemia   Memory loss   Hyperlipidemia   Allergies Allergies  Allergen Reactions  . Aleve [Naproxen Sodium] Other (See Comments)    Due to kidneys.  . Motrin [Ibuprofen] Other (See Comments)    Due to kidneys.  . Nsaids Other (See Comments)    KIDNEY FAILURE AND ONLY HAS PARTIAL LEFT KIDNEY REMAINING (RIGHT ONE TAKEN OUT)  . Contrast Media [Iodinated Diagnostic Agents] Itching  . Fentanyl     Facial swelling   . Tape     Please use "paper" tape    Diagnostic Studies/Procedures    Cardiac Catheterization and Percutaneous Coronary Intervention 3.20.2017 Coronary Findings     Dominance: Right    Left Main   . Ost LM to LM lesion, 50% stenosed.      Left Anterior Descending   . Mid LAD to Dist LAD lesion, 85% stenosed. Calcified tubular eccentric. The lesion was previously treated with a stent (unknown type).   . PCI: The LAD was successfully stented using a 2.5 x 32 mm & a 2.5 x 20 mm Promus DES'.  . There is no residual stenosis post intervention.     . First Diagonal Branch   The vessel is small in size.   Colon Flattery 1st Diag to 1st Diag lesion, 70% stenosed. Eccentric.   Marland Kitchen Second Engineer, production   . 2nd Diag lesion, 90% stenosed. Tubular eccentric.      Left Circumflex   . Prox Cx to Mid Cx lesion, 95% stenosed. Discrete eccentric ulcerative.   Marland Kitchen PCI: The LCX was successfully stented using a 3.0 x 16 mm Promus Premier DES  . There is no  residual stenosis post intervention.     . Dist Cx lesion, 65% stenosed. The lesion is type C Tubular.      Right Coronary Artery   . Prox RCA to Dist RCA lesion, 60% stenosed. Diffuse eccentric.   Marland Kitchen Dist RCA lesion, 0% stenosed.   . There is no residual stenosis post intervention.     . Right Posterior Descending Artery   . RPDA lesion, 95% stenosed. The lesion is type C Thrombotic eccentric located at the major branch. The lesion was not previously treated.   Marland Kitchen PCI: the PDA was successfully stented using a 2.5 x 12 mm Promus Premier DES  . There is no residual stenosis post intervention.     . Second Right Posterolateral   The vessel is small in size.    _____________   History of Present Illness     65 year old female with a prior history of severe multivessel coronary artery disease, end-stage renal disease, hyperlipidemia, and normocytic anemia. She previously underwent diagnostic catheterization in June 2016 revealing multivessel disease. She was not felt to be a suitable candidate for bypass surgery secondary to poor venous conduits and debility. She has been medically managed since then and unfortunately has continued to have life limiting  dyspnea, which is felt to be her anginal equivalent. She was recently seen in clinic by Dr. Tamala Julian, and decision was made to pursue multivessel percutaneous intervention.  Hospital Course     Consultants: nephrology   Patient presented to the [facility] diagnostic catheterization laboratory on March 20 and underwent diagnostic catheterization revealed revealing severe LAD, circumflex, and right posterior descending arterial disease. The LAD was successfully treated with 2 Promus drug-eluting stents while both the circumflex and RPDA were treated with one Promus drug-eluting stent respectively. Patient tolerated procedure well and postprocedure has had no recurrence of chest pain or dyspnea. Nephrology was consult and she did undergo dialysis this  morning. Of note, as she has been on Plavix chronically, P2Y12 testing was undertaken and found to be elevated at 317. In that setting, Plavix was discontinued and she has been loaded with brilinta and we will plan to discharge on brilinta 90 mg twice a day.  She has ambulated after dialysis without O2 and had no dyspnea.  She did report mild chest discomfort but then later denied it.  She will be discharged today. _____________  Discharge Vitals Blood pressure 156/75, pulse 68, temperature 97.1 F (36.2 C), temperature source Oral, resp. rate 15, height 5' 4.5" (1.638 m), weight 164 lb 7.4 oz (74.6 kg), SpO2 93 %.  Filed Weights   02/04/16 0353 02/04/16 0743 02/04/16 1116  Weight: 170 lb 6.7 oz (77.3 kg) 169 lb 8.5 oz (76.9 kg) 164 lb 7.4 oz (74.6 kg)    Labs & Radiologic Studies    CBC  Recent Labs  02/04/16 0330  WBC 5.6  HGB 11.0*  HCT 35.1*  MCV 95.9  PLT XX123456   Basic Metabolic Panel  Recent Labs  02/03/16 0859 02/04/16 0330  NA 142 137  K 3.3* 3.8  CL 97* 96*  CO2 30 29  GLUCOSE 88 191*  BUN 18 27*  CREATININE 4.21* 4.93*  CALCIUM 9.7 9.3   P2Y12 317 _____________  Disposition   Pt is being discharged home today in good condition.  Follow-up Plans & Appointments    Follow-up Information    Follow up with [facility] R, NP On 02/14/2016.   Specialties:  Cardiology, Radiology   Why:  11:00  PM   Contact information:   [place] [address] [phone]      Discharge Instructions    Amb Referral to Cardiac Rehabilitation    Complete by:  As directed   Diagnosis:  PCI           Discharge Medications   Current Discharge Medication List    START taking these medications   Details  nitroGLYCERIN (NITROSTAT) 0.4 MG SL tablet Place 1 tablet (0.4 mg total) under the tongue every 5 (five) minutes as needed for chest pain. Qty: 25 tablet, Refills: 3    ticagrelor (BRILINTA) 90 MG TABS tablet Take 1 tablet (90 mg total) by  mouth 2 (two) times daily. Qty: 60 tablet, Refills: 6      CONTINUE these medications which have NOT CHANGED   Details  acetaminophen (TYLENOL) 500 MG tablet Take 500 mg by mouth every 6 (six) hours as needed for headache.    aspirin 81 MG tablet Take 81 mg by mouth daily.    atorvastatin (LIPITOR) 40 MG tablet Take 40 mg by mouth every morning.    benzonatate (TESSALON) 200 MG capsule Take 1 capsule (200 mg total) by mouth 3 (three) times daily as needed for  cough. Qty: 20 capsule, Refills: 0    calcium carbonate (OS-CAL - DOSED IN MG OF ELEMENTAL CALCIUM) 1250 (500 CA) MG tablet Take two (2) tablets by mouth three (3) times daily with meals and take two (2) tablets by mouth with snacks.    docusate sodium (COLACE) 100 MG capsule Take 100 mg by mouth daily as needed for mild constipation or moderate constipation.    fluticasone (FLONASE) 50 MCG/ACT nasal spray Place 1 spray into both nostrils daily as needed for allergies or rhinitis.    lanthanum (FOSRENOL) 1000 MG chewable tablet Chew 1,000 mg by mouth 3 (three) times daily with meals.    lidocaine-prilocaine (EMLA) cream APPLY SMALL AMOUNT TO ACCESS SITE 1 TO 2 HOURS BEFORE DIALYSIS. COVER WITH OCCLUSIVE DRESSING (SARAN WRAP). Refills: 12    loratadine (CLARITIN) 10 MG tablet Take 1 tablet (10 mg total) by mouth daily. Qty: 30 tablet, Refills: 11    midodrine (PROAMATINE) 10 MG tablet Take one (1) tablet (10 mg total) by mouth at bedtime on Mondays, Wednesdays, and Fridays. Take one (1) tablet (10 mg total) by mouth pre-dialysis on Tuesdays, Thursdays, and Saturdays. Refills: 3    multivitamin (RENA-VIT) TABS tablet Take 1 tablet by mouth at bedtime. Qty: 90 tablet, Refills: 3    ondansetron (ZOFRAN) 4 MG tablet Take 4 mg by mouth every 6 (six) hours as needed for nausea or vomiting.    pantoprazole (PROTONIX) 40 MG tablet TAKE 1 TABLET BY MOUTH DAILY Qty: 30 tablet, Refills: 0    promethazine (PHENERGAN) 25 MG tablet Take  25 mg by mouth every 6 (six) hours as needed for nausea or vomiting.      STOP taking these medications     clopidogrel (PLAVIX) 75 MG tablet          Outstanding Labs/Studies   None  Duration of Discharge Encounter   Greater than 30 minutes including physician time.  Signed, Murray Hodgkins NP 02/04/2016, 3:41 PM

## 2016-02-04 NOTE — Discharge Instructions (Signed)
You had an allergic reaction to the contrast that was used for the heart catheterization.  If you need another procedure that requires contrast you will need to take medication to prevent an allergic reaction.

## 2016-02-04 NOTE — Progress Notes (Signed)
Patient Name: [name] [name] Date of Encounter: 02/04/2016  Hospital Problem List     Principal Problem:   Unstable angina [facility]) Active Problems:   CAD (coronary artery disease), native coronary artery   End stage renal disease ([place])   Diabetes mellitus type 2 in obese ([place])   Anemia   Memory loss   Hyperlipidemia   Allergic Rxn  Subjective   Seen in dialysis.  Tired/groggy.  No c/p or sob overnight.  Facial swelling resolved.  Inpatient Medications    . aspirin EC  81 mg Oral Daily  . atorvastatin  40 mg Oral q morning - 10a  . calcium carbonate  1,000 mg of elemental calcium Oral TID WC  . clopidogrel  75 mg Oral Daily  . lanthanum  1,000 mg Oral TID WC  . loratadine  10 mg Oral Daily  . midodrine  10 mg Oral Q T,Th,Sa-HD  . midodrine  10 mg Oral Once per day on Mon Wed Fri  . multivitamin  1 tablet Oral QHS  . pantoprazole  40 mg Oral Daily  . sodium chloride flush  3 mL Intravenous Q12H  . sodium chloride flush  3 mL Intravenous Q12H    Vital Signs    Filed Vitals:   02/04/16 0802 02/04/16 0830 02/04/16 0900 02/04/16 0926  BP: 164/88 151/84 146/75 137/71  Pulse: 66 65 62 64  Temp:      TempSrc:      Resp:      Height:      Weight:      SpO2:        Intake/Output Summary (Last 24 hours) at 02/04/16 0939 Last data filed at 02/04/16 0600  Gross per 24 hour  Intake      0 ml  Output      0 ml  Net      0 ml   Filed Weights   02/03/16 0835 02/04/16 0353 02/04/16 0743  Weight: 166 lb (75.297 kg) 170 lb 6.7 oz (77.3 kg) 169 lb 8.5 oz (76.9 kg)    Physical Exam    General: Pleasant, NAD. Groggy. Dozing off during interview. Neuro: Alert and oriented X 3. Moves all extremities spontaneously. Psych: Flat affect. HEENT:  Normal  Neck: Supple without bruits or JVD. Lungs:  Resp regular and unlabored, diminished breath sounds bilat. Heart: RRR no s3, s4, 2/6 SEM @ apex  axilla. Abdomen: Soft, non-tender, non-distended, BS + x 4.  Extremities: No  clubbing, cyanosis or edema. DP/PT/Radials 2+ and equal bilaterally. R groin cath site w/o bleeding/bruit/hematoma.   Labs    CBC  Recent Labs  02/04/16 0330  WBC 5.6  HGB 11.0*  HCT 35.1*  MCV 95.9  PLT XX123456   Basic Metabolic Panel  Recent Labs  02/03/16 0859 02/04/16 0330  NA 142 137  K 3.3* 3.8  CL 97* 96*  CO2 30 29  GLUCOSE 88 191*  BUN 18 27*  CREATININE 4.21* 4.93*  CALCIUM 9.7 9.3    Telemetry    rsr  ECG    RSR, 63, no acute st/t changes.  Radiology    No results found.  Assessment & Plan    1.  USA/CAD:  S/p cath and 3 vessel PCI on 3/20.  No c/p or sob overnight.  R groin looks good.  In HD this AM so not yet seen by cardiac rehab.  P2Y12 = 317  poor plavix responsiveness.  Discussed with Dr. Tamala Julian  will load with  brilinta 180 mg immediately and plan to d/c on brilinta 90 bid.  Cont asa, statin. No  blocker/nitrates 2/2 h/o hypotension.  2.  HL:  Cont statin Rx.  LDL 43 in 09/2015.  3.  ESRD:  Appreciate nephrology assistance.  HD this AM.  4.  Normocytic Anemia:  Stable.  5.  Hypotension:  BP up some since admission but she has a h/o hypotn and is on midodrine @ home.  6.  Dispo:  Ambulate post-HD with plan for d/c this later this AM.  Signed, Murray Hodgkins NP

## 2016-02-04 NOTE — Progress Notes (Signed)
CARDIAC REHAB PHASE I   PRE:  Rate/Rhythm: 24 SR  BP:  Sitting: 152/75        SaO2: 94 RA  MODE:  Ambulation: 200 ft   POST:  Rate/Rhythm: 77 SR  BP:  Sitting: 122/50         SaO2: 91 RA  Pt ambulated 200 ft on RA, handheld assist, rolling walker, steady gait, tolerated fair.  Pt c/o of  DOE, 6/10 chest heaviness, improved with rest, brief standing rest x1. Completed PCI/stent education.  Reviewed risk factors, anti-platelet therapy, stent card, activity restrictions, ntg, exercise, diet information (pt follows renal diet, gave heart healthy and diabetes diet handouts), daily weights, sodium and fluid restrictions, s/s heart failure, and phase 2 cardiac rehab. Pt verbalized understanding. Pt tearful throughout much of education, states she has been through a lot of emotional challenges and changes in her life in the past six month beginning with a fall, he death of her husband, losing her home and the ability to drive. Emotional support given to pt. Pt agrees to phase 2 cardiac rehab referral, will send to [facility] per pt request. Pt eager to go home. Pt to recliner after walk, call bell within reach.    [id number] Lenna Sciara, RN, BSN 02/04/2016 2:56 PM

## 2016-02-04 NOTE — Progress Notes (Signed)
Pt observed desat= 84%  without oxygen at night. O2 left @2L /min Sats=95-97%.

## 2016-02-04 NOTE — Care Management Note (Addendum)
Case Management Note  Patient Details  Name: [name] [name] MRN: [id number] Date of Birth: 1950/11/22  Subjective/Objective:         Unstable angina, S/p cath and 3 vessel PCI on 3/20           Action/Plan: Discharge Planning: AVS reviewed:   NCM spoke to pt at bedside. Pt states she lives at home with sister, [name] [name] [phone]. States she has RW and cane at home. She is able to afford her medications. Had Gentiva in the past for [facility]. Currently resides at  [place] in her own apt. Provided pt with a Brilinta 30 day free trial card. Educated pt on how to use the card. Will check benefits for Brilinta.   PCP- [name] Houseman MD  S/W JOSH @ [facility] # [phone]   BRILINTA 90 MG BID 60 TAB   COVER- YES  CO- PAY- $37.00  TIER- 3 DRUG  PRIOR APPROVAL - NO  PHARMACY : [facility]   MAIL ORDER- $ 47.00  Expected Discharge Date:  02/04/2016               Expected Discharge Plan:  Home/Self Care  In-House Referral:  NA  Discharge planning Services  CM Consult  Post Acute Care Choice:  NA Choice offered to:  NA  DME Arranged:  N/A DME Agency:  NA  HH Arranged:  NA HH Agency:  NA  Status of Service:  Completed, signed off  Medicare Important Message Given:    Date Medicare IM Given:    Medicare IM give by:    Date Additional Medicare IM Given:    Additional Medicare Important Message give by:     If discussed at [place] of Stay Meetings, dates discussed:    Additional Comments:  Erenest Rasher, RN 02/04/2016, 12:34 PM

## 2016-02-14 ENCOUNTER — Encounter: Payer: Self-pay | Admitting: Cardiology

## 2016-02-14 ENCOUNTER — Ambulatory Visit (INDEPENDENT_AMBULATORY_CARE_PROVIDER_SITE_OTHER): Payer: Medicare Other | Admitting: Cardiology

## 2016-02-14 VITALS — BP 150/90 | HR 55 | Ht 64.5 in | Wt 166.4 lb

## 2016-02-14 DIAGNOSIS — E785 Hyperlipidemia, unspecified: Secondary | ICD-10-CM

## 2016-02-14 DIAGNOSIS — I10 Essential (primary) hypertension: Secondary | ICD-10-CM

## 2016-02-14 DIAGNOSIS — Z959 Presence of cardiac and vascular implant and graft, unspecified: Secondary | ICD-10-CM

## 2016-02-14 DIAGNOSIS — N186 End stage renal disease: Secondary | ICD-10-CM

## 2016-02-14 DIAGNOSIS — I259 Chronic ischemic heart disease, unspecified: Secondary | ICD-10-CM | POA: Diagnosis not present

## 2016-02-14 DIAGNOSIS — Z95820 Peripheral vascular angioplasty status with implants and grafts: Secondary | ICD-10-CM

## 2016-02-14 DIAGNOSIS — I2510 Atherosclerotic heart disease of native coronary artery without angina pectoris: Secondary | ICD-10-CM | POA: Diagnosis not present

## 2016-02-14 MED ORDER — HYDROCOD POLST-CPM POLST ER 10-8 MG/5ML PO SUER: 5.0000 mL | Freq: Two times a day (BID) | ORAL | Status: DC | PRN

## 2016-02-14 NOTE — Progress Notes (Signed)
Cardiology Office Note   Date:  02/15/2016   ID:  [name] [name], DOB 20-Apr-1951, MRN [id number]  PCP:  Smiley Houseman, MD  Cardiologist:  Dr. Tamala Julian    Chief Complaint  Patient presents with  . Hospitalization Follow-up    no complaints      History of Present Illness: [name] [name] is a 65 y.o. female who presents for post hospitalization after planned cath 02/03/16 and PCI/DES X 2 to LAD, 1 stent to LCX and 1 to distal RCA/RPDA .  Nephrology followed as well for dialysis.   She has multivessel CAD, surgical turn down, limiting dyspnea felt to be an anginal equivalent, and end-stage renal disease.   The patient has seen Dr.Van Trigt on multiple occasions to determine if she would benefit from surgical revascularization. She was ultimately turned down because of debility and absence of venous conduits that would allow revascularization. She was discussed at heart conference and the opinion was that she would benefit from multivessel PCI. Her symptoms are limiting dyspnea on exertion. She is in rehabilitation at her nursing facility. She dialyzes 3 times per week. Cardiac cath in June 2016 demonstrated high-grade segmental LAD stenosis, relatively focal severe stenosis in the proximal circumflex, and distal RCA high-grade disease.  Today she is doing well, asking questions concerning her health to help her take care of herself.  She asked for diet information.  She has chronic cough and I filled her tussinex the only thing that has stopped her cough (no ACE) and she is planning cataract surgery. She had reaction to fentanyl or contrast with cath and was treated.  Her skin is almost back to normal.  I gave the names of the agents so she would keep track of this.  She is taking her asa and Brilinta daily.  No chest pain and no SOB.     She was a plavix non responder with P2Y12 was 317, higher risk of thrombotic events.   Past Medical History  Diagnosis Date  . Hearing difficulty      Mild   . Diabetes mellitus ([place])   . Coronary artery disease     a. 6/16 Cath: Severe multivessel dzs->not surg candidate 2/2 debility/poor venous conduits->Med Rx; b. 01/2016 Cath/PCI: LM 50ost, LAD 53m (2.5x32 & 2.5x20 Promus Prem DES'), D1 70ost, D2 90, LCX 95p/m (3.0x16 Promus Prem DES), 65d, RCA 60p->d, RPDA 95 (2.5x12 Promus Prem DES).  . ESRD (end stage renal disease) on dialysis ([place])        . Hypertension   . Ischemic cardiomyopathy     a. Echo 04/2015: EF 35%. Cath same time - 45%.  . Syncope   . Hyponatremia   . Anemia   . Thrombocytopenia ([place])   . Memory loss   . Hyperlipidemia     Past Surgical History  Procedure Laterality Date  . 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 Esophagogastroduodenoscopy Left 02/20/2013    Procedure: ESOPHAGOGASTRODUODENOSCOPY (EGD);  Surgeon: Cleotis Nipper, MD;  Location: [facility] ENDOSCOPY;  Service: Endoscopy;  Laterality: Left;  . Av fistula placement Left 03/07/2013    Procedure: ARTERIOVENOUS (AV) FISTULA CREATION;  Surgeon: Elam Dutch, MD;  Location: [facility] OR;  Service: Vascular;  Laterality: Left;  left brachiocephalic AVF  . Cardiac catheterization N/A 04/25/2015    Procedure: Left Heart Cath and Coronary Angiography;  Surgeon: Belva Crome, MD;  Location: [place] CV LAB;  Service: Cardiovascular;  Laterality: N/A;  .  Coronary stent placement  02/03/2016    LAD X2     CIRCUMFLEX       RCA   . Cardiac catheterization N/A 02/03/2016    Procedure: Coronary Stent Intervention;  Surgeon: Belva Crome, MD;  Location: [place] CV LAB;  Service: Cardiovascular;  Laterality: N/A;     Current Outpatient Prescriptions  Medication Sig Dispense Refill  . acetaminophen (TYLENOL) 500 MG tablet Take 500 mg by mouth every 6 (six) hours as needed for headache.    Marland Kitchen aspirin 81 MG tablet Take 81 mg by mouth daily.    Marland Kitchen atorvastatin (LIPITOR) 40 MG tablet Take 40 mg by mouth every morning.    . benzonatate (TESSALON)  200 MG capsule Take 1 capsule (200 mg total) by mouth 3 (three) times daily as needed for cough. 20 capsule 0  . calcium carbonate (OS-CAL - DOSED IN MG OF ELEMENTAL CALCIUM) 1250 (500 CA) MG tablet Take two (2) tablets by mouth three (3) times daily with meals and take two (2) tablets by mouth with snacks.    . clopidogrel (PLAVIX) 75 MG tablet Take 75 mg by mouth once.   0  . docusate sodium (COLACE) 100 MG capsule Take 100 mg by mouth daily as needed for mild constipation or moderate constipation.    . fluticasone (FLONASE) 50 MCG/ACT nasal spray Place 1 spray into both nostrils daily as needed for allergies or rhinitis.    Marland Kitchen lanthanum (FOSRENOL) 1000 MG chewable tablet Chew 1,000 mg by mouth 3 (three) times daily with meals.    . lidocaine-prilocaine (EMLA) cream APPLY SMALL AMOUNT TO ACCESS SITE 1 TO 2 HOURS BEFORE DIALYSIS. COVER WITH OCCLUSIVE DRESSING (SARAN WRAP).  12  . midodrine (PROAMATINE) 10 MG tablet Take one (1) tablet (10 mg total) by mouth at bedtime on Mondays, Wednesdays, and Fridays. Take one (1) tablet (10 mg total) by mouth pre-dialysis on Tuesdays, Thursdays, and Saturdays.  3  . multivitamin (RENA-VIT) TABS tablet Take 1 tablet by mouth at bedtime. 90 tablet 3  . nitroGLYCERIN (NITROSTAT) 0.4 MG SL tablet Place 1 tablet (0.4 mg total) under the tongue every 5 (five) minutes as needed for chest pain. 25 tablet 3  . ondansetron (ZOFRAN) 4 MG tablet Take 4 mg by mouth every 6 (six) hours as needed for nausea or vomiting.    . pantoprazole (PROTONIX) 40 MG tablet TAKE 1 TABLET BY MOUTH DAILY 30 tablet 0  . promethazine (PHENERGAN) 25 MG tablet Take 25 mg by mouth every 6 (six) hours as needed for nausea or vomiting.    . ticagrelor (BRILINTA) 90 MG TABS tablet Take 1 tablet (90 mg total) by mouth 2 (two) times daily. 60 tablet 6  . chlorpheniramine-HYDROcodone (TUSSIONEX PENNKINETIC ER) 10-8 MG/5ML SUER Take 5 mLs by mouth every 12 (twelve) hours as needed for cough. 115 mL 0    No current facility-administered medications for this visit.    Allergies:   Aleve; Motrin; Nsaids; Contrast media; Fentanyl; and Tape    Social History:  The patient  reports that she has never smoked. She has never used smokeless tobacco. She reports that she does not drink alcohol or use illicit drugs.   Family History:  The patient's family history includes Bone cancer in her father; Heart attack in her father and sister; Stroke in her brother and sister; Throat cancer in her father.    ROS:  General:no colds or fevers, no weight changes Skin:no rashes or ulcers HEENT:no blurred vision,  no congestion CV:see HPI PUL:see HPI GI:no diarrhea constipation or melena, no indigestion GU:no hematuria, no dysuria MS:no joint pain, no claudication Neuro:no syncope, no lightheadedness Endo:+ diabetes, no thyroid disease   Wt Readings from Last 3 Encounters:  02/14/16 166 lb 6.4 oz (75.479 kg)  02/04/16 164 lb 7.4 oz (74.6 kg)  01/30/16 171 lb 6.4 oz (77.747 kg)     PHYSICAL EXAM: VS:  BP 150/90 mmHg  Pulse 55  Ht 5' 4.5" (1.638 m)  Wt 166 lb 6.4 oz (75.479 kg)  BMI 28.13 kg/m2 , BMI Body mass index is 28.13 kg/(m^2). General:Pleasant affect, NAD Skin:Warm and dry, brisk capillary refill HEENT:normocephalic, sclera clear, mucus membranes moist Neck:supple, no JVD, no bruits  Heart:S1S2 RRR with 2/6 systolic murmur, no gallup, rub or click Lungs:clear without rales, rhonchi, or wheezes [id number], non tender, + BS, do not palpate liver spleen or masses Ext:no lower ext edema, 2+ pedal pulses, 2+ radial pulses Neuro:alert and oriented X3, MAE, follows commands, + facial symmetry    EKG:  EKG is NOT ordered today.    Recent Labs: 04/25/2015: TSH 1.974 12/02/2015: ALT 10*; B Natriuretic Peptide >4500.0* 02/04/2016: BUN 27*; Creatinine, Ser 4.93*; Hemoglobin 11.0*; Platelets 166; Potassium 3.8; Sodium 137    Lipid Panel    Component Value Date/Time   CHOL 93* 09/23/2015  0915   TRIG 81 09/23/2015 0915   HDL 34* 09/23/2015 0915   CHOLHDL 2.7 09/23/2015 0915   VLDL 16 09/23/2015 0915   LDLCALC 43 09/23/2015 0915       Other studies Reviewed: CATH: Additional studies/ records that were reviewed today include: .  1. Dist Cx lesion, 65% stenosed. 2. Prox RCA to Dist RCA lesion, 60% stenosed. 3. Ost 1st Diag to 1st Diag lesion, 70% stenosed. 4. 2nd Diag lesion, 90% stenosed. 5. Ost LM to LM lesion, 50% stenosed. 6. Mid LAD to Dist LAD lesion, 85% stenosed. Post intervention, there is a 0% residual stenosis. The lesion was previously treated with a stent (unknown type). 7. Prox Cx to Mid Cx lesion, 95% stenosed. Post intervention, there is a 0% residual stenosis. 8. RPDA lesion, 95% stenosed. Post intervention, there is a 0% residual stenosis. The lesion was not previously treated.   Severe three-vessel coronary disease in a patient who is unable to undergo CABG due to comorbidities and absence of venous conduits from the lower extremities.   CHIP with successful 3 vessel percutaneous coronary intervention using overlapping DE stents in the midsegment of the LAD reducing 80-90% stenosis to 0% with TIMI grade 3 flow (final balloon diameter 2.75 mm). A relatively focal mid circumflex stenosis was reduced from 85% to 0% with TIMI grade 3 flow (final balloon diameter of 3.25 mm). An ostial- proximal PDA stenosis was reduced from 95% to 0% with TIMI grade 3 flow (final balloon diameter 2.75 mm).  The patient developed severe pruritus of her eyes and lips after first contrast exposure. This improved after IV Solu-Medrol, Pepcid, and Benadryl.  Successful Angio-Seal with good hemostasis.   Recommendations:   P2Y12 in a.m.  Nephrology service has been consulted and patient will receive dialysis either later this evening or in a.m. Total contrast used 205 cc.  If all goes well, the patient will be eligible for discharge tomorrow after  dialysis.  Echo: Study Conclusions  - Left ventricle: The cavity size was normal. Wall thickness was  increased in a pattern of mild LVH. Systolic function was mildly  to moderately reduced. The estimated ejection fraction  was in the  range of 40% to 45%. Inferoseptal hypokinesis. Doppler parameters  are consistent with abnormal left ventricular relaxation (grade 1  diastolic dysfunction). The E/e&' ratio is between 8-15,  suggesting indeterminate LV filling pressure. - Aortic valve: Trileaflet; mildly calcified leaflets. There was  mild regurgitation. - Mitral valve: Calcified annulus. Mildly thickened leaflets .  There was mild regurgitation. - Left atrium: Moderately dilated at 40 ml/m2. - Right atrium: The atrium was mildly dilated. - Tricuspid valve: There was moderate regurgitation. - Pulmonic valve: There was mild regurgitation. - Pulmonary arteries: Dilated. PA peak pressure: 61 mm Hg (S). - Inferior vena cava: The vessel was dilated. The respirophasic  diameter changes were blunted (< 50%), consistent with elevated  central venous pressure.  Impressions:  - Compared to a prior study in 04/2015, the EF is slightly higher at  40-45% with persistent inferior and inferoseptal wall motion  abnormalities. The RVSP is now higher at 61 mmHg.    ASSESSMENT AND PLAN:  1. CAD in native artery Multivessel CAD involving LAD, circumflex, and distal RCA. Surgical turned down due to multiple medical comorbidities, absence of adequate venous conduits, end-stage renal disease, and fragility.  The patient has significant dyspnea on exertion that is an ischemic equivalent, New York Heart Association class III. We are unable to use much in way of medical anti-ischemic therapy because of hemodynamic instability on dialysis.  2. PCI/DES X 2 to LAD, 1 stent to LCX and 1 to distal RCA/RPDA 01/2016---she will follow up with Dr. Tamala Julian in July  3.  plavix nonresponder  On  Brilinta and ASA  4. ESRD (end stage renal disease) ([place]) T-TH-Sat  5. Cardiomyopathy, ischemic/chronic combined systolic and diastolic heart failure Last LVEF in the 35-40% range at the time of cath in June 2016 --EF on Echo 11/2015 40-45%  6. HTN essential elevated today but usually low with dialysis no change in meds on midodrine.    7. Hyperlipidemia treated. Continue statin   Current medicines are reviewed with the patient today.  The patient Has no concerns regarding medicines.  The following changes have been made:  See above Labs/ tests ordered today include:see above  Disposition:   FU:  see above  Signed, Isaiah Serge, NP  02/15/2016 2:21 PM    [facility] [address], [place], [place] Phone: [phone]; Fax: [phone]

## 2016-02-14 NOTE — Patient Instructions (Addendum)
You are allergic to Fentanyl and contrast media  Medication Instructions:  YOU HAVE BEEN GIVEN AN RX FOR TUSSIONEX  Labwork: NONE  Testing/Procedures: NONE  Follow-Up: DR. Tamala [name] ON 06/05/16 @ 11:30   Any Other Special Instructions Will Be Listed Below (If Applicable).   If you need a refill on your cardiac medications before your next appointment, please call your pharmacy.  DASH Eating Plan DASH stands for "Dietary Approaches to Stop Hypertension." The DASH eating plan is a healthy eating plan that has been shown to reduce high blood pressure (hypertension). Additional health benefits may include reducing the risk of type 2 diabetes mellitus, heart disease, and stroke. The DASH eating plan may also help with weight loss. WHAT DO I NEED TO KNOW ABOUT THE DASH EATING PLAN? For the DASH eating plan, you will follow these general guidelines:  Choose foods with a percent daily value for sodium of less than 5% (as listed on the food label).  Use salt-free seasonings or herbs instead of table salt or sea salt.  Check with your health care provider or pharmacist before using salt substitutes.  Eat lower-sodium products, often labeled as "lower sodium" or "no salt added."  Eat fresh foods.  Eat more vegetables, fruits, and low-fat dairy products.  Choose whole grains. Look for the word "whole" as the first word in the ingredient list.  Choose fish and skinless chicken or [place] more often than red meat. Limit fish, poultry, and meat to 6 oz (170 g) each day.  Limit sweets, desserts, sugars, and sugary drinks.  Choose heart-healthy fats.  Limit cheese to 1 oz (28 g) per day.  Eat more home-cooked food and less restaurant, buffet, and fast food.  Limit fried foods.  Cook foods using methods other than frying.  Limit canned vegetables. If you do use them, rinse them well to decrease the sodium.  When eating at a restaurant, ask that your food be prepared with less salt, or  no salt if possible. WHAT FOODS CAN I EAT? Seek help from a dietitian for individual calorie needs. Grains Whole grain or whole wheat bread. Brown rice. Whole grain or whole wheat pasta. Quinoa, bulgur, and whole grain cereals. Low-sodium cereals. Corn or whole wheat flour tortillas. Whole grain cornbread. Whole grain crackers. Low-sodium crackers. Vegetables Fresh or frozen vegetables (raw, steamed, roasted, or grilled). Low-sodium or reduced-sodium tomato and vegetable juices. Low-sodium or reduced-sodium tomato sauce and paste. Low-sodium or reduced-sodium canned vegetables.  Fruits All fresh, canned (in natural juice), or frozen fruits. Meat and Other Protein Products Ground beef (85% or leaner), grass-fed beef, or beef trimmed of fat. Skinless chicken or [place]. Ground chicken or [place]. Pork trimmed of fat. All fish and seafood. Eggs. Dried beans, peas, or lentils. Unsalted nuts and seeds. Unsalted canned beans. Dairy Low-fat dairy products, such as skim or 1% milk, 2% or reduced-fat cheeses, low-fat ricotta or cottage cheese, or plain low-fat yogurt. Low-sodium or reduced-sodium cheeses. Fats and Oils Tub margarines without trans fats. Light or reduced-fat mayonnaise and salad dressings (reduced sodium). Avocado. Safflower, olive, or canola oils. Natural peanut or almond butter. Other Unsalted popcorn and pretzels. The items listed above may not be a complete list of recommended foods or beverages. Contact your dietitian for more options. WHAT FOODS ARE NOT RECOMMENDED? Grains White bread. White pasta. White rice. Refined cornbread. Bagels and croissants. Crackers that contain trans fat. Vegetables Creamed or fried vegetables. Vegetables in a cheese sauce. Regular canned vegetables. Regular canned tomato sauce and  paste. Regular tomato and vegetable juices. Fruits Dried fruits. Canned fruit in light or heavy syrup. Fruit juice. Meat and Other Protein Products Fatty cuts of meat.  Ribs, chicken wings, bacon, sausage, bologna, salami, chitterlings, fatback, hot dogs, bratwurst, and packaged luncheon meats. Salted nuts and seeds. Canned beans with salt. Dairy Whole or 2% milk, cream, half-and-half, and cream cheese. Whole-fat or sweetened yogurt. Full-fat cheeses or blue cheese. Nondairy creamers and whipped toppings. Processed cheese, cheese spreads, or cheese curds. Condiments Onion and garlic salt, seasoned salt, table salt, and sea salt. Canned and packaged gravies. Worcestershire sauce. Tartar sauce. Barbecue sauce. Teriyaki sauce. Soy sauce, including reduced sodium. Steak sauce. Fish sauce. Oyster sauce. Cocktail sauce. Horseradish. Ketchup and mustard. Meat flavorings and tenderizers. Bouillon cubes. Hot sauce. Tabasco sauce. Marinades. Taco seasonings. Relishes. Fats and Oils Butter, stick margarine, lard, shortening, ghee, and bacon fat. Coconut, palm kernel, or palm oils. Regular salad dressings. Other Pickles and olives. Salted popcorn and pretzels. The items listed above may not be a complete list of foods and beverages to avoid. Contact your dietitian for more information. WHERE CAN I FIND MORE INFORMATION? National Heart, Lung, and Blood Institute: travelstabloid.com   This information is not intended to replace advice given to you by your health care provider. Make sure you discuss any questions you have with your health care provider.   Document Released: 10/22/2011 Document Revised: 11/23/2014 Document Reviewed: 09/06/2013 Elsevier Interactive Patient Education [employer].

## 2016-02-15 ENCOUNTER — Encounter: Payer: Self-pay | Admitting: Cardiology

## 2016-02-24 ENCOUNTER — Encounter: Payer: Self-pay | Admitting: Internal Medicine

## 2016-02-24 ENCOUNTER — Ambulatory Visit (INDEPENDENT_AMBULATORY_CARE_PROVIDER_SITE_OTHER): Payer: Medicare Other | Admitting: Internal Medicine

## 2016-02-24 VITALS — BP 165/52 | HR 64 | Temp 98.2°F | Ht 64.5 in | Wt 164.0 lb

## 2016-02-24 DIAGNOSIS — E7800 Pure hypercholesterolemia, unspecified: Secondary | ICD-10-CM | POA: Diagnosis not present

## 2016-02-24 DIAGNOSIS — E669 Obesity, unspecified: Secondary | ICD-10-CM

## 2016-02-24 DIAGNOSIS — E119 Type 2 diabetes mellitus without complications: Secondary | ICD-10-CM

## 2016-02-24 DIAGNOSIS — Z1211 Encounter for screening for malignant neoplasm of colon: Secondary | ICD-10-CM | POA: Diagnosis not present

## 2016-02-24 DIAGNOSIS — Z0000 Encounter for general adult medical examination without abnormal findings: Secondary | ICD-10-CM | POA: Diagnosis not present

## 2016-02-24 DIAGNOSIS — Z1212 Encounter for screening for malignant neoplasm of rectum: Secondary | ICD-10-CM

## 2016-02-24 DIAGNOSIS — E1169 Type 2 diabetes mellitus with other specified complication: Secondary | ICD-10-CM

## 2016-02-24 DIAGNOSIS — Z1159 Encounter for screening for other viral diseases: Secondary | ICD-10-CM

## 2016-02-24 LAB — POCT GLYCOSYLATED HEMOGLOBIN (HGB A1C): HEMOGLOBIN A1C: 5.2

## 2016-02-24 LAB — HEPATITIS C ANTIBODY: HCV Ab: NEGATIVE

## 2016-02-24 MED ORDER — ATORVASTATIN CALCIUM 40 MG PO TABS: 40.0000 mg | ORAL_TABLET | Freq: Every morning | ORAL | Status: DC

## 2016-02-24 NOTE — Patient Instructions (Addendum)
Thank you for coming in!  Please make a follow up appointment soon to talk about you appetite and to get a pap smear done  I made a referral to GI for your colonoscopy; our clinic will call you about the appointment time and date I gave you information about your mammogram

## 2016-02-24 NOTE — Progress Notes (Addendum)
Patient ID: [name] [name], female   DOB: 01-23-1951, 65 y.o.   MRN: [id number] Date of Visit: 02/24/2016   HPI:  Patient presents today for a well woman exam.   Concerns today: wanted to review medications; medications updated in chart Periods: postmenopausal  Contraception: none Pelvic symptoms: none; no vaginal discharge, bleeding or itching Sexual activity: none STD Screening: none Pap smear status: last pap "7-8 years ago"; her doctor moved and never got another one; no hx abnormal pap  Exercise: class at [employer] 3 days a week; 30 mins Diet: decreased appetite since taking Midrodine about 7-8 months ago   Smoking: no  Alcohol: no Drugs: no  Mood: sometimes "moody especially around the holidays"; lost husband 6 months ago; has good support system- sister; declines medications; symptoms have improved;no SI/HI; Patient is part of "lift your spirits" program that meets once a week  Dentist: states she needs to make an appointment   ROS: Denied chest pain, sob. Notes of cough which has improved with recent medication given by her cardiologist. Intermittent diarrhea/constipation which is chronic, notes of decreased vision but reports she has to schedule cataract surgery soon. Notes of decreased appetite since being started on midodrine by her nephrologist.   [place]:  PMH, FH, SH reviewed and updated in chart  PHYSICAL EXAM: BP 165/52 mmHg  Pulse 64  Temp(Src) 98.2 F (36.8 C) (Oral)  Ht 5' 4.5" (1.638 m)  Wt 164 lb (74.39 kg)  BMI 27.73 kg/m2 Gen: NAD, pleasant, cooperative HEENT: NCAT, PERRL, no palpable thyromegaly or anterior cervical lymphadenopathy Heart: RRR, no murmurs Lungs: CTAB, NWOB Abdomen: soft, nontender to palpation Neuro: grossly nonfocal, speech normal LE: skin changes from venous stasis; trace pitting edema bilaterally, no calf tenderness  ASSESSMENT/PLAN:  # Health maintenance:  -STD screening: declined -pap smear: no pap smears in our chart;  patient reports last pap 7-8 years ago; no history of abnormal pap  -mammogram: due; given information to make an appointment - Hep C screening -lipid screening: patient had not been taking Lipitor (on medication list), refilled medication. Lipid panel not due until 09/2016 -immunizations: states she received a pneumonia vaccine possibly at dialysis; encouraged patient to verify and let us know  - colonoscopy: referral to GI made  FOLLOW UP: Follow up soon as patient's schedule permits to discuss decreased appetite and Pap smear  Smiley Houseman, MD PGY [place]

## 2016-03-12 ENCOUNTER — Other Ambulatory Visit: Payer: Self-pay | Admitting: *Deleted

## 2016-03-12 DIAGNOSIS — I2510 Atherosclerotic heart disease of native coronary artery without angina pectoris: Secondary | ICD-10-CM

## 2016-03-12 DIAGNOSIS — E7800 Pure hypercholesterolemia, unspecified: Secondary | ICD-10-CM

## 2016-03-12 NOTE — Telephone Encounter (Signed)
Covering inbox for Dr. [name]:  [name] [name] which medications patient is supposed to be taking. Will defer prescriptions to Dr. [name] when she returns on May 1st.   Christina Gambino, MD [place], PGY-1

## 2016-03-12 NOTE — Telephone Encounter (Signed)
Patient has enrolled with [facility]. They are requesting e-prescriptions be electronically sent.  They are also requesting Onetouch Delica lancets and OneTouch Ultra test strips.  Please check to make sure if patient should be taking both Clopidogrel and Brilinta.  If you have questions please call [phone].  Derl Barrow, RN

## 2016-03-17 NOTE — Telephone Encounter (Addendum)
I called patient to review medications that she has been taking prior to sending to new pharmacy.  During review, it was noted that she was taking Plavix along with ASA and Brilinta. Per chart review, cardiology noted that patient is a plavix non-responder and was therefore on ASA and Brilinta. I informed the patient this. She states that she will stop taking the Plavix. She also mentioned she is taking Imdur; she states that she just restarted the medicine about a month ago. I asked the patient to read me the date on the prescription bottle and she reported 09/2015. She then reported that she found the "bottle one day and started to take it". This medication is not on her current medication list. Per chart review, she was discharged in 11/2015 on Imdur, however at a office visit cardiology in 12/13/15, this medication was discontinued under "discontinued medications" but does not mention this is in the note or assessment and plan. I asked the patient to call her cardiologist to ask whether or not she needs to be on Imdur. I also told patient to mention that she has been taking Plavix along with ASA and Brilinta.   Many of her medications are prescribed by her nephrologist. Unfortunately, I cannot see these notes in her chart. I will discuss with a supervising attending regarding sending refills for these prescriptions.   Overall, I do not believe the patient understands some of her medication regimen very well. We did clarify certain things during this conversation.   As noted above I will need to discuss her medications with an attending prior to sending refills to new pharmacy.

## 2016-03-18 MED ORDER — TICAGRELOR 90 MG PO TABS: 90.0000 mg | ORAL_TABLET | Freq: Two times a day (BID) | ORAL | Status: DC

## 2016-03-18 MED ORDER — PANTOPRAZOLE SODIUM 40 MG PO TBEC: 40.0000 mg | DELAYED_RELEASE_TABLET | Freq: Every day | ORAL | Status: DC

## 2016-03-18 MED ORDER — NITROGLYCERIN 0.4 MG SL SUBL: 0.4000 mg | SUBLINGUAL_TABLET | SUBLINGUAL | Status: DC | PRN

## 2016-03-18 MED ORDER — ATORVASTATIN CALCIUM 40 MG PO TABS: 40.0000 mg | ORAL_TABLET | Freq: Every morning | ORAL | Status: DC

## 2016-03-18 MED ORDER — PROMETHAZINE HCL 25 MG PO TABS: 25.0000 mg | ORAL_TABLET | Freq: Four times a day (QID) | ORAL | Status: DC | PRN

## 2016-03-18 MED ORDER — ASPIRIN 81 MG PO TBEC: 81.0000 mg | DELAYED_RELEASE_TABLET | Freq: Every day | ORAL | Status: DC

## 2016-03-18 MED ORDER — ISOSORBIDE MONONITRATE 10 MG PO TABS: 15.0000 mg | ORAL_TABLET | Freq: Every day | ORAL | Status: DC

## 2016-03-18 NOTE — Telephone Encounter (Signed)
Will defer the following medications to be filled by patient's nephrologist: [place], Fosrenol, Rena Vit, Calcium carbonate. Patient is not taking Claritin. Patient should not be taking Plavix as she is on ASA and Brilinta

## 2016-03-19 ENCOUNTER — Encounter (HOSPITAL_COMMUNITY)
Admission: RE | Admit: 2016-03-19 | Discharge: 2016-03-19 | Disposition: A | Discharge status: Home / Self Care | Payer: Medicare Other | Source: Ambulatory Visit | Attending: Interventional Cardiology | Admitting: Interventional Cardiology

## 2016-03-19 VITALS — BP 106/64 | HR 70 | Ht 62.0 in | Wt 160.9 lb

## 2016-03-19 DIAGNOSIS — Z955 Presence of coronary angioplasty implant and graft: Secondary | ICD-10-CM | POA: Insufficient documentation

## 2016-03-19 DIAGNOSIS — E785 Hyperlipidemia, unspecified: Secondary | ICD-10-CM | POA: Insufficient documentation

## 2016-03-19 DIAGNOSIS — I1311 Hypertensive heart and chronic kidney disease without heart failure, with stage 5 chronic kidney disease, or end stage renal disease: Secondary | ICD-10-CM | POA: Insufficient documentation

## 2016-03-19 DIAGNOSIS — E1122 Type 2 diabetes mellitus with diabetic chronic kidney disease: Secondary | ICD-10-CM | POA: Diagnosis not present

## 2016-03-19 DIAGNOSIS — I2510 Atherosclerotic heart disease of native coronary artery without angina pectoris: Secondary | ICD-10-CM | POA: Insufficient documentation

## 2016-03-19 DIAGNOSIS — N186 End stage renal disease: Secondary | ICD-10-CM | POA: Diagnosis not present

## 2016-03-19 DIAGNOSIS — Z7982 Long term (current) use of aspirin: Secondary | ICD-10-CM | POA: Insufficient documentation

## 2016-03-19 DIAGNOSIS — E871 Hypo-osmolality and hyponatremia: Secondary | ICD-10-CM | POA: Insufficient documentation

## 2016-03-19 DIAGNOSIS — Z79899 Other long term (current) drug therapy: Secondary | ICD-10-CM | POA: Diagnosis not present

## 2016-03-19 NOTE — Progress Notes (Signed)
Cardiac Individual Treatment Plan  Patient Details  Name: [name] [name] MRN: [id number] Date of Birth: 06-06-1951 Referring Provider:        [place] from 03/19/2016 in [facility]   Referring Provider  [place] MD      Initial Encounter Date:       [place] from 03/19/2016 in [place]   Date  03/19/16   Referring Provider  Smith,Henry MD      Visit Diagnosis: 02/03/16 Stented coronary artery  Patient's Home Medications on Admission:  Current outpatient prescriptions:  .  acetaminophen (TYLENOL) 500 MG tablet, Take 500 mg by mouth every 6 (six) hours as needed for headache., Disp: , Rfl:  .  aspirin 81 MG EC tablet, Take 1 tablet (81 mg total) by mouth daily., Disp: 30 tablet, Rfl: 0 .  atorvastatin (LIPITOR) 40 MG tablet, Take 1 tablet (40 mg total) by mouth every morning., Disp: 30 tablet, Rfl: 2 .  calcium carbonate (OS-CAL - DOSED IN MG OF ELEMENTAL CALCIUM) 1250 (500 CA) MG tablet, Take two (2) tablets by mouth three (3) times daily with meals and take two (2) tablets by mouth with snacks., Disp: , Rfl:  .  chlorpheniramine-HYDROcodone (TUSSIONEX PENNKINETIC ER) 10-8 MG/5ML SUER, Take 5 mLs by mouth every 12 (twelve) hours as needed for cough., Disp: 115 mL, Rfl: 0 .  clopidogrel (PLAVIX) 75 MG tablet, Take 75 mg by mouth once. , Disp: , Rfl: 0 .  docusate sodium (COLACE) 100 MG capsule, Take 100 mg by mouth daily as needed for mild constipation., Disp: , Rfl:  .  loratadine (CLARITIN) 10 MG tablet, Take 10 mg by mouth daily., Disp: , Rfl:  .  midodrine (PROAMATINE) 10 MG tablet, Take one (1) tablet (10 mg total) by mouth at bedtime on Mondays, Wednesdays, and Fridays. Take one (1) tablet (10 mg total) by mouth pre-dialysis on Tuesdays, Thursdays, and Saturdays., Disp: , Rfl: 3 .  multivitamin (RENA-VIT) TABS tablet, Take 1 tablet by mouth at bedtime., Disp: 90  tablet, Rfl: 3 .  nitroGLYCERIN (NITROSTAT) 0.4 MG SL tablet, Place 1 tablet (0.4 mg total) under the tongue every 5 (five) minutes as needed for chest pain., Disp: 5 tablet, Rfl: 3 .  ondansetron (ZOFRAN) 4 MG tablet, Take 4 mg by mouth every 6 (six) hours as needed for nausea or vomiting. Reported on 02/24/2016, Disp: , Rfl:  .  promethazine (PHENERGAN) 25 MG tablet, Take 1 tablet (25 mg total) by mouth every 6 (six) hours as needed for nausea or vomiting., Disp: 30 tablet, Rfl: 0 .  ticagrelor (BRILINTA) 90 MG TABS tablet, Take 1 tablet (90 mg total) by mouth 2 (two) times daily., Disp: 60 tablet, Rfl: 6 .  fluticasone (FLONASE) 50 MCG/ACT nasal spray, Place 1 spray into both nostrils daily as needed for allergies or rhinitis. Reported on 03/19/2016, Disp: , Rfl:  .  isosorbide mononitrate (ISMO,MONOKET) 10 MG tablet, Take 1.5 tablets (15 mg total) by mouth daily. (Patient not taking: Reported on 03/19/2016), Disp: 30 tablet, Rfl: 0 .  lanthanum (FOSRENOL) 1000 MG chewable tablet, Chew 1,000 mg by mouth 3 (three) times daily with meals. Reported on 03/19/2016, Disp: , Rfl:  .  pantoprazole (PROTONIX) 40 MG tablet, Take 1 tablet (40 mg total) by mouth daily. (Patient not taking: Reported on 03/19/2016), Disp: 30 tablet, Rfl: 0  Past Medical History: Past Medical History  Diagnosis Date  .  Hearing difficulty     Mild   . Diabetes mellitus ([place])   . Coronary artery disease     a. 6/16 Cath: Severe multivessel dzs->not surg candidate 2/2 debility/poor venous conduits->Med Rx; b. 01/2016 Cath/PCI: LM 50ost, LAD 66m (2.5x32 & 2.5x20 Promus Prem DES'), D1 70ost, D2 90, LCX 95p/m (3.0x16 Promus Prem DES), 65d, RCA 60p->d, RPDA 95 (2.5x12 Promus Prem DES).  . ESRD (end stage renal disease) on dialysis ([place])        . Hypertension   . Ischemic cardiomyopathy     a. Echo 04/2015: EF 35%. Cath same time - 45%.  . Syncope   . Hyponatremia   . Anemia   . Thrombocytopenia ([place])   . Memory loss   . Hyperlipidemia      Tobacco Use: History  Smoking status  . Never Smoker   Smokeless tobacco  . Never Used    Labs: Recent Review Flowsheet Data    Labs for ITP Cardiac and Pulmonary Rehab Latest Ref Rng 06/12/2014 04/25/2015 08/06/2015 09/23/2015 02/24/2016   Cholestrol 125 - 200 mg/dL 191 - - 93(L) -   LDLCALC <130 mg/dL 117(H) - - 43 -   HDL >=46 mg/dL 51 - - 34(L) -   Trlycerides <150 mg/dL 115 - - 81 -   Hemoglobin A1c - 6.1 - 5.4 - 5.2   PHART 7.350 - 7.450 - 7.413 - - -   PCO2ART 35.0 - 45.0 mmHg - 37.9 - - -   HCO3 20.0 - 24.0 mEq/L - 23.9 - - -   TCO2 0 - 100 mmol/L - 25.1 - - -   ACIDBASEDEF 0.0 - 2.0 mmol/L - 0.2 - - -   O2SAT - - 86.7 - - -      Capillary Blood Glucose: Lab Results  Component Value Date   GLUCAP 101* 02/04/2016   GLUCAP 152* 02/04/2016   GLUCAP 211* 02/03/2016   GLUCAP 178* 02/03/2016   GLUCAP 86 02/03/2016     Exercise Target Goals: Date: 03/19/16  Exercise Program Goal: Individual exercise prescription set with THRR, safety & activity barriers. Participant demonstrates ability to understand and report RPE using BORG scale, to self-measure pulse accurately, and to acknowledge the importance of the exercise prescription.  Exercise Prescription Goal: Starting with aerobic activity 30 plus minutes a day, 3 days per week for initial exercise prescription. Provide home exercise prescription and guidelines that participant acknowledges understanding prior to discharge.  Activity Barriers & Risk Stratification:     Activity Barriers & Cardiac Risk Stratification - 03/19/16 0952    Activity Barriers & Cardiac Risk Stratification   Activity Barriers Back Problems;Joint Problems;Muscular Weakness;Assistive Device   Cardiac Risk Stratification High      6 Minute Walk:     6 Minute Walk      03/19/16 1117       6 Minute Walk   Phase Initial     Distance 512 feet     Walk Time 4 minutes     # of Rest Breaks 1  2 min     MPH 1.5     METS 1.5     RPE  13     VO2 Peak 5.14     Symptoms Yes (comment)     Comments fatigue     Resting HR 70 bpm     Resting BP 106/64 mmHg     Max Ex. HR 81 bpm     Max Ex. BP 148/84 mmHg  2 Minute Post BP 126/80 mmHg        Initial Exercise Prescription:     Initial Exercise Prescription - 03/19/16 1100    Date of Initial Exercise RX and Referring Provider   Date 03/19/16   Referring Provider Smith,Henry MD   Recumbant Bike   Level 1   Minutes 10   METs 1.2   NuStep   Level 1   Minutes 10   METs 1.2   Track   Laps 5   Minutes 10   METs 1.2   Intensity   THRR 40-80% of Max Heartrate 62-123   Ratings of Perceived Exertion 11-13   Perceived Dyspnea 0-4   Progression   Progression Continue to progress workloads to maintain intensity without signs/symptoms of physical distress.   Resistance Training   Training Prescription Yes   Weight 1lb   Reps 10-12      Perform Capillary Blood Glucose checks as needed.  Exercise Prescription Changes:   Exercise Comments:   Discharge Exercise Prescription (Final Exercise Prescription Changes):   Nutrition:  Target Goals: Understanding of nutrition guidelines, daily intake of sodium 1500mg , cholesterol 200mg , calories 30% from fat and 7% or less from saturated fats, daily to have 5 or more servings of fruits and vegetables.  Biometrics:     Pre Biometrics - 03/19/16 1125    Pre Biometrics   Waist Circumference 40 inches   Hip Circumference 45.25 inches   Waist to Hip Ratio 0.88 %   Triceps Skinfold 19 mm   % Body Fat 31 %   Grip Strength 22 kg   Flexibility 0 in   Single Leg Stand 0 seconds       Nutrition Therapy Plan and Nutrition Goals:   Nutrition Discharge: Nutrition Scores:   Nutrition Goals Re-Evaluation:   Psychosocial: Target Goals: Acknowledge presence or absence of depression, maximize coping skills, provide positive support system. Participant is able to verbalize types and ability to use techniques and  skills needed for reducing stress and depression.  Initial Review & Psychosocial Screening:     Initial Psych Review & Screening - 03/19/16 [place]? Yes   Concerns Recent loss of significant other   Comments husband passed away Aug 08, 2015   Barriers   Psychosocial barriers to participate in program There are no identifiable barriers or psychosocial needs.   Screening Interventions   Interventions Encouraged to exercise      Quality of Life Scores:     Quality of Life - 03/19/16 1143    Quality of Life Scores   Health/Function Pre 17.54 %   Socioeconomic Pre 14.2 %   Psych/Spiritual Pre 13.92 %   Family Pre 20 %   GLOBAL Pre 16.43 %      PHQ-9:     Recent Review Flowsheet Data    Depression screen [facility] 2/9 02/24/2016 12/11/2015 09/16/2015 09/06/2015 08/28/2015   Decreased Interest 0 1  0 1  1   Down, Depressed, Hopeless 0 0 0 1 1   PHQ - 2 Score 0 1 0 2 2   Altered sleeping - - - - 2   Tired, decreased energy - - - - 2   Change in appetite - - - - 1   Feeling bad or failure about yourself  - - - - 2   Trouble concentrating - - - - 2   Moving slowly or fidgety/restless - - - - 0   Suicidal  thoughts - - - - 0   PHQ-9 Score - - - - 11   Difficult doing work/chores - - - - Somewhat difficult      Psychosocial Evaluation and Intervention:   Psychosocial Re-Evaluation:   Vocational Rehabilitation: Provide vocational rehab assistance to qualifying candidates.   Vocational Rehab Evaluation & Intervention:     Vocational Rehab - 03/19/16 1504    Initial Vocational Rehab Evaluation & Intervention   Assessment shows need for Vocational Rehabilitation No      Education: Education Goals: Education classes will be provided on a weekly basis, covering required topics. Participant will state understanding/return demonstration of topics presented.  Learning Barriers/Preferences:     Learning Barriers/Preferences - 03/19/16 0953     Learning Barriers/Preferences   Learning Barriers Sight;Hearing  have reading glasses and hearing aid   Learning Preferences Written Material;Skilled Demonstration      Education Topics: Count Your Pulse:  -Group instruction provided by verbal instruction, demonstration, patient participation and written materials to support subject.  Instructors address importance of being able to find your pulse and how to count your pulse when at home without a heart monitor.  Patients get hands on experience counting their pulse with staff help and individually.   Heart Attack, Angina, and Risk Factor Modification:  -Group instruction provided by verbal instruction, video, and written materials to support subject.  Instructors address signs and symptoms of angina and heart attacks.    Also discuss risk factors for heart disease and how to make changes to improve heart health risk factors.   Functional Fitness:  -Group instruction provided by verbal instruction, demonstration, patient participation, and written materials to support subject.  Instructors address safety measures for doing things around the house.  Discuss how to get up and down off the floor, how to pick things up properly, how to safely get out of a chair without assistance, and balance training.   Meditation and Mindfulness:  -Group instruction provided by verbal instruction, patient participation, and written materials to support subject.  Instructor addresses importance of mindfulness and meditation practice to help reduce stress and improve awareness.  Instructor also leads participants through a meditation exercise.    Stretching for Flexibility and Mobility:  -Group instruction provided by verbal instruction, patient participation, and written materials to support subject.  Instructors lead participants through series of stretches that are designed to increase flexibility thus improving mobility.  These stretches are additional  exercise for major muscle groups that are typically performed during regular warm up and cool down.   Hands Only CPR Anytime:  -Group instruction provided by verbal instruction, video, patient participation and written materials to support subject.  Instructors co-teach with AHA video for hands only CPR.  Participants get hands on experience with mannequins.   Nutrition I class: Heart Healthy Eating:  -Group instruction provided by PowerPoint slides, verbal discussion, and written materials to support subject matter. The instructor gives an explanation and review of the Therapeutic Lifestyle Changes diet recommendations, which includes a discussion on lipid goals, dietary fat, sodium, fiber, plant stanol/sterol esters, sugar, and the components of a well-balanced, healthy diet.   Nutrition II class: Lifestyle Skills:  -Group instruction provided by PowerPoint slides, verbal discussion, and written materials to support subject matter. The instructor gives an explanation and review of label reading, grocery shopping for heart health, heart healthy recipe modifications, and ways to make healthier choices when eating out.   Diabetes Question & Answer:  -Group instruction provided by PowerPoint  slides, verbal discussion, and written materials to support subject matter. The instructor gives an explanation and review of diabetes co-morbidities, pre- and post-prandial blood glucose goals, pre-exercise blood glucose goals, signs, symptoms, and treatment of hypoglycemia and hyperglycemia, and foot care basics.   Diabetes Blitz:  -Group instruction provided by PowerPoint slides, verbal discussion, and written materials to support subject matter. The instructor gives an explanation and review of the physiology behind type 1 and type 2 diabetes, diabetes medications and rational behind using different medications, pre- and post-prandial blood glucose recommendations and Hemoglobin A1c goals, diabetes diet,  and exercise including blood glucose guidelines for exercising safely.    Portion Distortion:  -Group instruction provided by PowerPoint slides, verbal discussion, written materials, and food models to support subject matter. The instructor gives an explanation of serving size versus portion size, changes in portions sizes over the last 20 years, and what consists of a serving from each food group.   Stress Management:  -Group instruction provided by verbal instruction, video, and written materials to support subject matter.  Instructors review role of stress in heart disease and how to cope with stress positively.     Exercising on Your Own:  -Group instruction provided by verbal instruction, power point, and written materials to support subject.  Instructors discuss benefits of exercise, components of exercise, frequency and intensity of exercise, and end points for exercise.  Also discuss use of nitroglycerin and activating EMS.  Review options of places to exercise outside of rehab.  Review guidelines for sex with heart disease.   Cardiac Drugs I:  -Group instruction provided by verbal instruction and written materials to support subject.  Instructor reviews cardiac drug classes: antiplatelets, anticoagulants, beta blockers, and statins.  Instructor discusses reasons, side effects, and lifestyle considerations for each drug class.   Cardiac Drugs II:  -Group instruction provided by verbal instruction and written materials to support subject.  Instructor reviews cardiac drug classes: angiotensin converting enzyme inhibitors (ACE-I), angiotensin II receptor blockers (ARBs), nitrates, and calcium channel blockers.  Instructor discusses reasons, side effects, and lifestyle considerations for each drug class.   Anatomy and Physiology of the Circulatory System:  -Group instruction provided by verbal instruction, video, and written materials to support subject.  Reviews functional anatomy of  heart, how it relates to various diagnoses, and what role the heart plays in the overall system.   Knowledge Questionnaire Score:     Knowledge Questionnaire Score - 03/19/16 1113    Knowledge Questionnaire Score   Pre Score 19/24      Core Components/Risk Factors/Patient Goals at Admission:     Personal Goals and Risk Factors at Admission - 03/19/16 1141    Core Components/Risk Factors/Patient Goals on Admission    Weight Management Yes;Weight Loss   Intervention Weight Management: Provide education and appropriate resources to help participant work on and attain dietary goals.;Weight Management: Develop a combined nutrition and exercise program designed to reach desired caloric intake, while maintaining appropriate intake of nutrient and fiber, sodium and fats, and appropriate energy expenditure required for the weight goal.   Expected Outcomes Short Term: Continue to assess and modify interventions until short term weight is achieved;Long Term: Adherence to nutrition and physical activity/exercise program aimed toward attainment of established weight goal;Weight Maintenance: Understanding of the daily nutrition guidelines, which includes 25-35% calories from fat, 7% or less cal from saturated fats, less than 200mg  cholesterol, less than 1.5gm of sodium, & 5 or more servings of fruits and vegetables daily;Weight Loss:  Understanding of general recommendations for a balanced deficit meal plan, which promotes 1-2 lb weight loss per week and includes a negative energy balance of [phone] kcal/d;Understanding recommendations for meals to include 15-35% energy as protein, 25-35% energy from fat, 35-60% energy from carbohydrates, less than 200mg  of dietary cholesterol, 20-35 gm of total fiber daily   Increase Strength and Stamina Yes   Hypertension Yes   Intervention Provide education on lifestyle modifcations including regular physical activity/exercise, weight management, moderate sodium  restriction and increased consumption of fresh fruit, vegetables, and low fat dairy, alcohol moderation, and smoking cessation.;Monitor prescription use compliance.   Expected Outcomes Short Term: Continued assessment and intervention until BP is < 140/71mm HG in hypertensive participants. < 130/15mm HG in hypertensive participants with diabetes, heart failure or chronic kidney disease.;Long Term: Maintenance of blood pressure at goal levels.   Lipids Yes   Intervention Provide education and support for participant on nutrition & aerobic/resistive exercise along with prescribed medications to achieve LDL 70mg , HDL >40mg .   Expected Outcomes Short Term: Participant states understanding of desired cholesterol values and is compliant with medications prescribed. Participant is following exercise prescription and nutrition guidelines.;Long Term: Cholesterol controlled with medications as prescribed, with individualized exercise RX and with personalized nutrition plan. Value goals: LDL < 70mg , HDL > 40 mg.   Intervention Offer individual and/or small group education and counseling on adjustment to heart disease, stress management and health-related lifestyle change. Teach and support self-help strategies.;Refer participants experiencing significant psychosocial distress to appropriate mental health specialists for further evaluation and treatment. When possible, include family members and significant others in education/counseling sessions.   Expected Outcomes Short Term: Participant demonstrates changes in health-related behavior, relaxation and other stress management skills, ability to obtain effective social support, and compliance with psychotropic medications if prescribed.;Long Term: Emotional wellbeing is indicated by absence of clinically significant psychosocial distress or social isolation.      Core Components/Risk Factors/Patient Goals Review:    Core Components/Risk Factors/Patient Goals at  Discharge (Final Review):    ITP Comments:     ITP Comments      03/19/16 0951           ITP Comments Dr. Fransico Him, Medical Director          Comments: Patient attended orientation from 0800 to 1000 to review rules and guidelines for program. Completed 6 minute walk test with walking with a rollater for assistance and taking a 2 minute sit down rest break, Intitial ITP, and exercise prescription.  VSS. Telemetry-Sinus Rhythm.  Asymptomatic. Will get clearance from Dr Deterding for Mrs Wishard to exercise on her non dialysis days. The patient's sister was here with the patient today.

## 2016-03-19 NOTE — Progress Notes (Signed)
Cardiac Rehab Medication Review by a Pharmacist  Does the patient  feel that his/her medications are working for him/her?  yes  Has the patient been experiencing any side effects to the medications prescribed?  no  Does the patient measure his/her own blood pressure or blood glucose at home?  no   Does the patient have any problems obtaining medications due to transportation or finances?   no  Understanding of regimen: good Understanding of indications: good Potential of compliance: good  Pharmacist comments: 65 y/o F with hx of dementia to cardiac rehab with with assistance from her sister. Pt and sister helped update medication list. Counseled patient and family on medication regimen. Pt endorses compliance and no issues with current regimen.   Myrene Galas, PharmD 03/19/2016 9:02 AM

## 2016-03-23 ENCOUNTER — Encounter (HOSPITAL_COMMUNITY)
Admission: RE | Admit: 2016-03-23 | Discharge: 2016-03-23 | Disposition: A | Discharge status: Home / Self Care | Payer: Medicare Other | Source: Ambulatory Visit | Attending: Interventional Cardiology | Admitting: Interventional Cardiology

## 2016-03-23 ENCOUNTER — Encounter (HOSPITAL_COMMUNITY): Payer: Self-pay

## 2016-03-23 DIAGNOSIS — Z955 Presence of coronary angioplasty implant and graft: Secondary | ICD-10-CM | POA: Diagnosis not present

## 2016-03-23 NOTE — Progress Notes (Signed)
Daily Session Note  Patient Details  Name: [name] [name] MRN: [id number] Date of Birth: 1951/03/21 Referring Provider:        [facility] from 03/19/2016 in [place]   Referring Provider  Smith,Henry MD      Encounter Date: 03/23/2016  Check In:     Session Check In - 03/23/16 1405    Check-In   Location [facility]   Staff Present Seward Carol, MS, ACSM CEP, Exercise Physiologist;Carlette Wilber Oliphant, RN, BSN;Amber Fair, MS, ACSM RCEP, Exercise Physiologist;Joann Rion, RN, Marga Melnick, RN, BSN   Supervising physician immediately available to respond to emergencies [facility] immediately available   Physician(s) Dr. Marily Memos   Medication changes reported     No   Fall or balance concerns reported    No   Warm-up and Cool-down Performed as group-led instruction   Resistance Training Performed Yes   VAD Patient? No   Pain Assessment   Currently in Pain? Yes   Pain Score 6    Pain Location Leg   Pain Orientation Right   Pain Type Chronic pain   Multiple Pain Sites No      Capillary Blood Glucose: No results found for this or any previous visit (from the past 24 hour(s)).   Goals Met:  Exercise tolerated well  Goals Unmet:  Not Applicable  Comments: Pt started cardiac rehab today.  Pt tolerated light exercise without difficulty. VSS, telemetry-sinus arrythmia, asymptomatic.  Medication list reconciled. Pt denies barriers to medicaiton compliance.  PSYCHOSOCIAL ASSESSMENT:  PHQ-9, pt reports many major life changes including death of her spouse, starting hemodialysis, moving into [employer] senior apartments with her sister, turning in her drivers license and now heart disease.  Despite all these stressors, pt does seem to exhibit positive coping skills, hopeful outlook with supportive family.  Pt states her and her sisters have upcoming beach trip scheduled to get away and rest.   Stress  management would be helpful for patient. Pt encouraged to exercise.    Pt enjoys playing bingo, games and movies at the center.   Pt oriented to exercise equipment and routine.    Understanding verbalized.   Dr. Fransico Him is Medical Director for Cardiac Rehab at [facility].

## 2016-03-24 ENCOUNTER — Telehealth: Payer: Self-pay | Admitting: *Deleted

## 2016-03-24 NOTE — Telephone Encounter (Signed)
Pharmacist from [facility] called needing clarification in quantity of isosorbide mononitrate.  #30 sent in to pharmacy, but #45 needed for month supply.  Please up date medication list.  Derl Barrow, RN

## 2016-03-25 ENCOUNTER — Encounter (HOSPITAL_COMMUNITY)
Admission: RE | Admit: 2016-03-25 | Discharge: 2016-03-25 | Disposition: A | Discharge status: Home / Self Care | Payer: Medicare Other | Source: Ambulatory Visit | Attending: Interventional Cardiology | Admitting: Interventional Cardiology

## 2016-03-25 DIAGNOSIS — Z955 Presence of coronary angioplasty implant and graft: Secondary | ICD-10-CM | POA: Diagnosis not present

## 2016-03-25 NOTE — Progress Notes (Signed)
Pt brougjht medication list for reconciliation.  Med list reconciled.

## 2016-03-25 NOTE — Progress Notes (Signed)
Spoke to [facility] nurse who reports pt does have stable blood pressure with dialysis.  Pt is on 40 ounce daily fluid restriction. Pt has upcoming trip planned to [place] with her sister and will be absent from cardiac rehab on 03/30/16 -04/01/16.  Pt tolerated exercise without difficulty.

## 2016-03-27 ENCOUNTER — Encounter (HOSPITAL_COMMUNITY)
Admission: RE | Admit: 2016-03-27 | Discharge: 2016-03-27 | Disposition: A | Discharge status: Home / Self Care | Payer: Medicare Other | Source: Ambulatory Visit | Attending: Interventional Cardiology | Admitting: Interventional Cardiology

## 2016-03-27 ENCOUNTER — Telehealth: Payer: Self-pay | Admitting: *Deleted

## 2016-03-27 DIAGNOSIS — Z955 Presence of coronary angioplasty implant and graft: Secondary | ICD-10-CM

## 2016-03-27 NOTE — Telephone Encounter (Signed)
Prior Authorization received from [employer] for Promethazine. PA completed online at www.covermymeds.com via [place] [facility] [place].  PA process could take 3-5 business days to complete.  Derl Barrow, RN

## 2016-03-30 ENCOUNTER — Encounter (HOSPITAL_COMMUNITY): Payer: Medicare Other

## 2016-03-30 NOTE — Telephone Encounter (Signed)
PA was approved via [place] [facility] [place]. Derl Barrow, RN

## 2016-03-31 ENCOUNTER — Telehealth (HOSPITAL_COMMUNITY): Payer: Self-pay | Admitting: Cardiac Rehabilitation

## 2016-03-31 NOTE — Telephone Encounter (Signed)
-----   Message from Mauricia Area, MD sent at 03/31/2016 12:47 PM EDT ----- Regarding: RE: cardiac rehab Severe debilitation will restrict .  BPs should be <170/110 and >90/60.   Nicole Kindred, MD ----- Message -----    From: Lowell Guitar, RN    Sent: 03/17/2016  12:25 PM      To: Mauricia Area, MD Subject: cardiac rehab                                  Dear Dr. Jimmy Footman,  Pt is scheduled to begin cardiac rehab Thursday Mar 19, 2016.  Are there any activity restrictions for the patient?  Also please indicate appropriate blood pressure parameters,  at rest and exercise.  Thank you, Andi Hence, RN, BSN Cardiac Pulmonary Rehab

## 2016-04-01 ENCOUNTER — Encounter (HOSPITAL_COMMUNITY): Payer: Medicare Other

## 2016-04-03 ENCOUNTER — Encounter (HOSPITAL_COMMUNITY)
Admission: RE | Admit: 2016-04-03 | Discharge: 2016-04-03 | Disposition: A | Discharge status: Home / Self Care | Payer: Medicare Other | Source: Ambulatory Visit | Attending: Interventional Cardiology | Admitting: Interventional Cardiology

## 2016-04-03 DIAGNOSIS — Z955 Presence of coronary angioplasty implant and graft: Secondary | ICD-10-CM

## 2016-04-03 NOTE — Progress Notes (Signed)
Cardiac Individual Treatment Plan  Patient Details  Name: [name] [name] MRN: [id number] Date of Birth: [birth year] Referring Provider:        [facility] from 03/19/2016 in [place]   Referring Provider  Faulk MD      Initial Encounter Date:       [facility] from 03/19/2016 in [place]   Date  03/19/16   Referring Provider  Smith,Henry MD      Visit Diagnosis: 02/03/16 Stented coronary artery  Patient's Home Medications on Admission:  Current outpatient prescriptions:  .  acetaminophen (TYLENOL) 500 MG tablet, Take 500 mg by mouth every 6 (six) hours as needed for headache., Disp: , Rfl:  .  aspirin 81 MG EC tablet, Take 1 tablet (81 mg total) by mouth daily., Disp: 30 tablet, Rfl: 0 .  atorvastatin (LIPITOR) 40 MG tablet, Take 1 tablet (40 mg total) by mouth every morning., Disp: 30 tablet, Rfl: 2 .  calcium carbonate (OS-CAL - DOSED IN MG OF ELEMENTAL CALCIUM) 1250 (500 CA) MG tablet, Take two (2) tablets by mouth three (3) times daily with meals and take two (2) tablets by mouth with snacks., Disp: , Rfl:  .  chlorpheniramine-HYDROcodone (TUSSIONEX PENNKINETIC ER) 10-8 MG/5ML SUER, Take 5 mLs by mouth every 12 (twelve) hours as needed for cough., Disp: 115 mL, Rfl: 0 .  clopidogrel (PLAVIX) 75 MG tablet, Take 75 mg by mouth once. Reported on 03/25/2016, Disp: , Rfl: 0 .  docusate sodium (COLACE) 100 MG capsule, Take 100 mg by mouth daily as needed for mild constipation., Disp: , Rfl:  .  fluticasone (FLONASE) 50 MCG/ACT nasal spray, Place 1 spray into both nostrils daily as needed for allergies or rhinitis. Reported on 03/25/2016, Disp: , Rfl:  .  isosorbide mononitrate (ISMO,MONOKET) 10 MG tablet, Take 1.5 tablets (15 mg total) by mouth daily. (Patient not taking: Reported on 03/19/2016), Disp: 30 tablet, Rfl: 0 .  lanthanum (FOSRENOL) 1000 MG chewable tablet, Chew  1,000 mg by mouth 3 (three) times daily with meals. Reported on 03/19/2016, Disp: , Rfl:  .  loratadine (CLARITIN) 10 MG tablet, Take 10 mg by mouth daily., Disp: , Rfl:  .  midodrine (PROAMATINE) 10 MG tablet, Take one (1) tablet (10 mg total) by mouth at bedtime on Mondays, Wednesdays, and Fridays. Take one (1) tablet (10 mg total) by mouth pre-dialysis on Tuesdays, Thursdays, and Saturdays., Disp: , Rfl: 3 .  multivitamin (RENA-VIT) TABS tablet, Take 1 tablet by mouth at bedtime., Disp: 90 tablet, Rfl: 3 .  nitroGLYCERIN (NITROSTAT) 0.4 MG SL tablet, Place 1 tablet (0.4 mg total) under the tongue every 5 (five) minutes as needed for chest pain., Disp: 5 tablet, Rfl: 3 .  ondansetron (ZOFRAN) 4 MG tablet, Take 4 mg by mouth every 6 (six) hours as needed for nausea or vomiting. Reported on 02/24/2016, Disp: , Rfl:  .  pantoprazole (PROTONIX) 40 MG tablet, Take 1 tablet (40 mg total) by mouth daily. (Patient not taking: Reported on 03/19/2016), Disp: 30 tablet, Rfl: 0 .  promethazine (PHENERGAN) 25 MG tablet, Take 1 tablet (25 mg total) by mouth every 6 (six) hours as needed for nausea or vomiting., Disp: 30 tablet, Rfl: 0 .  ticagrelor (BRILINTA) 90 MG TABS tablet, Take 1 tablet (90 mg total) by mouth 2 (two) times daily., Disp: 60 tablet, Rfl: 6  Past Medical History: Past Medical History  Diagnosis  Date  . Hearing difficulty     Mild   . Diabetes mellitus ([place])   . Coronary artery disease     a. 6/16 Cath: Severe multivessel dzs->not surg candidate 2/2 debility/poor venous conduits->Med Rx; b. 01/2016 Cath/PCI: LM 50ost, LAD 42m (2.5x32 & 2.5x20 Promus Prem DES'), D1 70ost, D2 90, LCX 95p/m (3.0x16 Promus Prem DES), 65d, RCA 60p->d, RPDA 95 (2.5x12 Promus Prem DES).  . ESRD (end stage renal disease) on dialysis ([place])        . Hypertension   . Ischemic cardiomyopathy     a. Echo 04/2015: EF 35%. Cath same time - 45%.  . Syncope   . Hyponatremia   . Anemia   . Thrombocytopenia ([place])   . Memory  loss   . Hyperlipidemia     Tobacco Use: History  Smoking status  . Never Smoker   Smokeless tobacco  . Never Used    Labs:     Recent Review Flowsheet Data    Labs for ITP Cardiac and Pulmonary Rehab Latest Ref Rng 06/12/2014 04/25/2015 08/06/2015 09/23/2015 02/24/2016   Cholestrol 125 - 200 mg/dL 191 - - 93(L) -   LDLCALC <130 mg/dL 117(H) - - 43 -   HDL >=46 mg/dL 51 - - 34(L) -   Trlycerides <150 mg/dL 115 - - 81 -   Hemoglobin A1c - 6.1 - 5.4 - 5.2   PHART 7.350 - 7.450 - 7.413 - - -   PCO2ART 35.0 - 45.0 mmHg - 37.9 - - -   HCO3 20.0 - 24.0 mEq/L - 23.9 - - -   TCO2 0 - 100 mmol/L - 25.1 - - -   ACIDBASEDEF 0.0 - 2.0 mmol/L - 0.2 - - -   O2SAT - - 86.7 - - -      Capillary Blood Glucose: Lab Results  Component Value Date   GLUCAP 101* 02/04/2016   GLUCAP 152* 02/04/2016   GLUCAP 211* 02/03/2016   GLUCAP 178* 02/03/2016   GLUCAP 86 02/03/2016     Exercise Target Goals:    Exercise Program Goal: Individual exercise prescription set with THRR, safety & activity barriers. Participant demonstrates ability to understand and report RPE using BORG scale, to self-measure pulse accurately, and to acknowledge the importance of the exercise prescription.  Exercise Prescription Goal: Starting with aerobic activity 30 plus minutes a day, 3 days per week for initial exercise prescription. Provide home exercise prescription and guidelines that participant acknowledges understanding prior to discharge.  Activity Barriers & Risk Stratification:     Activity Barriers & Cardiac Risk Stratification - 03/19/16 0952    Activity Barriers & Cardiac Risk Stratification   Activity Barriers Back Problems;Joint Problems;Muscular Weakness;Assistive Device   Cardiac Risk Stratification High      6 Minute Walk:     6 Minute Walk      03/19/16 1117       6 Minute Walk   Phase Initial     Distance 512 feet     Walk Time 4 minutes     # of Rest Breaks 1  2 min     MPH 1.5      METS 1.5     RPE 13     VO2 Peak 5.14     Symptoms Yes (comment)     Comments fatigue     Resting HR 70 bpm     Resting BP 106/64 mmHg     Max Ex. HR 81 bpm  Max Ex. BP 148/84 mmHg     2 Minute Post BP 126/80 mmHg        Initial Exercise Prescription:     Initial Exercise Prescription - 03/19/16 1100    Date of Initial Exercise RX and Referring Provider   Date 03/19/16   Referring Provider Smith,Henry MD   Recumbant Bike   Level 1   Minutes 10   METs 1.2   NuStep   Level 1   Minutes 10   METs 1.2   Track   Laps 5   Minutes 10   METs 1.2   Intensity   THRR 40-80% of Max Heartrate 62-123   Ratings of Perceived Exertion 11-13   Perceived Dyspnea 0-4   Progression   Progression Continue to progress workloads to maintain intensity without signs/symptoms of physical distress.   Resistance Training   Training Prescription Yes   Weight 1lb   Reps 10-12      Perform Capillary Blood Glucose checks as needed.  Exercise Prescription Changes:      Exercise Prescription Changes      04/03/16 1500           Exercise Review   Progression Yes       Response to Exercise   Blood Pressure (Admit) 116/62 mmHg       Blood Pressure (Exercise) 120/70 mmHg       Blood Pressure (Exit) 120/70 mmHg       Heart Rate (Admit) 67 bpm       Heart Rate (Exercise) 88 bpm       Heart Rate (Exit) 62 bpm       Rating of Perceived Exertion (Exercise) 11       Duration Progress to 30 minutes of continuous aerobic without signs/symptoms of physical distress       Intensity THRR unchanged       Progression   Progression Continue to progress workloads to maintain intensity without signs/symptoms of physical distress.       Average METs 1.8       Resistance Training   Training Prescription Yes       Weight 1lb       Reps 10-12       NuStep   Level 1       Minutes 25       METs 1.8       Home Exercise Plan   Plans to continue exercise at [facility] (comment)  [facility] - group exercise/ short walking bouts       Frequency Add 1 additional day to program exercise sessions.  incrementally increase walk distance... only on saturdays          Exercise Comments:      Exercise Comments      04/03/16 1603           Exercise Comments Adjusted pt Ex Rx due to pain and discomfort. pt feels more comfortable on the Nustep. pt did 25 minutes on Nustep without pain or discomfort          Discharge Exercise Prescription (Final Exercise Prescription Changes):     Exercise Prescription Changes - 04/03/16 1500    Exercise Review   Progression Yes   Response to Exercise   Blood Pressure (Admit) 116/62 mmHg   Blood Pressure (Exercise) 120/70 mmHg   Blood Pressure (Exit) 120/70 mmHg   Heart Rate (Admit) 67 bpm   Heart Rate (Exercise) 88 bpm   Heart Rate (Exit)  62 bpm   Rating of Perceived Exertion (Exercise) 11   Duration Progress to 30 minutes of continuous aerobic without signs/symptoms of physical distress   Intensity THRR unchanged   Progression   Progression Continue to progress workloads to maintain intensity without signs/symptoms of physical distress.   Average METs 1.8   Resistance Training   Training Prescription Yes   Weight 1lb   Reps 10-12   NuStep   Level 1   Minutes 25   METs 1.8   Home Exercise Plan   Plans to continue exercise at [facility] (comment)  [facility] - group exercise/ short walking bouts   Frequency Add 1 additional day to program exercise sessions.  incrementally increase walk distance... only on saturdays      Nutrition:  Target Goals: Understanding of nutrition guidelines, daily intake of sodium 1500mg , cholesterol 200mg , calories 30% from fat and 7% or less from saturated fats, daily to have 5 or more servings of fruits and vegetables.  Biometrics:     Pre Biometrics - 03/19/16 1125    Pre Biometrics   Waist Circumference 40 inches   Hip Circumference 45.25 inches   Waist to Hip Ratio  0.88 %   Triceps Skinfold 19 mm   % Body Fat 31 %   Grip Strength 22 kg   Flexibility 0 in   Single Leg Stand 0 seconds       Nutrition Therapy Plan and Nutrition Goals:     Nutrition Therapy & Goals - 03/20/16 0902    Nutrition Therapy   Diet Renal, Diabetic, Therapeutic Lifestyle Change   Personal Nutrition Goals   Personal Goal #1 0.5-2 lb wt loss/wk to a goal wt loss of 6-15 lb at graduation from [place], educate and counsel regarding individualized specific dietary modifications aiming towards targeted core components such as weight, hypertension, lipid management, diabetes, heart failure and other comorbidities.   Expected Outcomes Short Term Goal: Understand basic principles of dietary content, such as calories, fat, sodium, cholesterol and nutrients.;Long Term Goal: Adherence to prescribed nutrition plan.      Nutrition Discharge: Nutrition Scores:     Nutrition Assessments - 03/20/16 1000    MEDFICTS Scores   Pre Score 19  will verify score with pt      Nutrition Goals Re-Evaluation:   Psychosocial: Target Goals: Acknowledge presence or absence of depression, maximize coping skills, provide positive support system. Participant is able to verbalize types and ability to use techniques and skills needed for reducing stress and depression.  Initial Review & Psychosocial Screening:     Initial Psych Review & Screening - 03/19/16 [place]? Yes   Concerns Recent loss of significant other   Comments husband passed away 2015/07/28   Barriers   Psychosocial barriers to participate in program There are no identifiable barriers or psychosocial needs.   Screening Interventions   Interventions Encouraged to exercise      Quality of Life Scores:     Quality of Life - 03/19/16 1143    Quality of Life Scores   Health/Function Pre 17.54 %   Socioeconomic Pre 14.2 %    Psych/Spiritual Pre 13.92 %   Family Pre 20 %   GLOBAL Pre 16.43 %      PHQ-9:     Recent Review Flowsheet Data    Depression screen [facility] 2/9 03/23/2016 02/24/2016 12/11/2015 09/16/2015 09/06/2015   Decreased  Interest 0 0 1  0 1    Down, Depressed, Hopeless 2 0 0 0 1   PHQ - 2 Score 2 0 1 0 2   Altered sleeping 1 - - - -   Tired, decreased energy 1 - - - -   Change in appetite 2 - - - -   Feeling bad or failure about yourself  1 - - - -   Trouble concentrating 1 - - - -   Moving slowly or fidgety/restless 1 - - - -   Suicidal thoughts 0 - - - -   PHQ-9 Score 9 - - - -   Difficult doing work/chores Somewhat difficult - - - -      Psychosocial Evaluation and Intervention:   Psychosocial Re-Evaluation:   Vocational Rehabilitation: Provide vocational rehab assistance to qualifying candidates.   Vocational Rehab Evaluation & Intervention:     Vocational Rehab - 03/19/16 1504    Initial Vocational Rehab Evaluation & Intervention   Assessment shows need for Vocational Rehabilitation No      Education: Education Goals: Education classes will be provided on a weekly basis, covering required topics. Participant will state understanding/return demonstration of topics presented.  Learning Barriers/Preferences:     Learning Barriers/Preferences - 03/19/16 0953    Learning Barriers/Preferences   Learning Barriers Sight;Hearing  have reading glasses and hearing aid   Learning Preferences Written Material;Skilled Demonstration      Education Topics: Count Your Pulse:  -Group instruction provided by verbal instruction, demonstration, patient participation and written materials to support subject.  Instructors address importance of being able to find your pulse and how to count your pulse when at home without a heart monitor.  Patients get hands on experience counting their pulse with staff help and individually.   Heart Attack, Angina, and Risk Factor Modification:  -Group  instruction provided by verbal instruction, video, and written materials to support subject.  Instructors address signs and symptoms of angina and heart attacks.    Also discuss risk factors for heart disease and how to make changes to improve heart health risk factors.   Functional Fitness:  -Group instruction provided by verbal instruction, demonstration, patient participation, and written materials to support subject.  Instructors address safety measures for doing things around the house.  Discuss how to get up and down off the floor, how to pick things up properly, how to safely get out of a chair without assistance, and balance training.          CARDIAC REHAB PHASE II EXERCISE from 04/03/2016 in [place]   Date  04/03/16   Educator  Seward Carol, [place], ACSM CCES   Instruction Review Code  R- Review/reinforce      Meditation and Mindfulness:  -Group instruction provided by verbal instruction, patient participation, and written materials to support subject.  Instructor addresses importance of mindfulness and meditation practice to help reduce stress and improve awareness.  Instructor also leads participants through a meditation exercise.    Stretching for Flexibility and Mobility:  -Group instruction provided by verbal instruction, patient participation, and written materials to support subject.  Instructors lead participants through series of stretches that are designed to increase flexibility thus improving mobility.  These stretches are additional exercise for major muscle groups that are typically performed during regular warm up and cool down.   Hands Only CPR Anytime:  -Group instruction provided by verbal instruction, video, patient participation and written materials to support subject.  Instructors co-teach with AHA video for hands only CPR.  Participants get hands on experience with mannequins.   Nutrition I class: Heart Healthy Eating:   -Group instruction provided by PowerPoint slides, verbal discussion, and written materials to support subject matter. The instructor gives an explanation and review of the Therapeutic Lifestyle Changes diet recommendations, which includes a discussion on lipid goals, dietary fat, sodium, fiber, plant stanol/sterol esters, sugar, and the components of a well-balanced, healthy diet.   Nutrition II class: Lifestyle Skills:  -Group instruction provided by PowerPoint slides, verbal discussion, and written materials to support subject matter. The instructor gives an explanation and review of label reading, grocery shopping for heart health, heart healthy recipe modifications, and ways to make healthier choices when eating out.   Diabetes Question & Answer:  -Group instruction provided by PowerPoint slides, verbal discussion, and written materials to support subject matter. The instructor gives an explanation and review of diabetes co-morbidities, pre- and post-prandial blood glucose goals, pre-exercise blood glucose goals, signs, symptoms, and treatment of hypoglycemia and hyperglycemia, and foot care basics.   Diabetes Blitz:  -Group instruction provided by PowerPoint slides, verbal discussion, and written materials to support subject matter. The instructor gives an explanation and review of the physiology behind type 1 and type 2 diabetes, diabetes medications and rational behind using different medications, pre- and post-prandial blood glucose recommendations and Hemoglobin A1c goals, diabetes diet, and exercise including blood glucose guidelines for exercising safely.    Portion Distortion:  -Group instruction provided by PowerPoint slides, verbal discussion, written materials, and food models to support subject matter. The instructor gives an explanation of serving size versus portion size, changes in portions sizes over the last 20 years, and what consists of a serving from each food  group.   Stress Management:  -Group instruction provided by verbal instruction, video, and written materials to support subject matter.  Instructors review role of stress in heart disease and how to cope with stress positively.     Exercising on Your Own:  -Group instruction provided by verbal instruction, power point, and written materials to support subject.  Instructors discuss benefits of exercise, components of exercise, frequency and intensity of exercise, and end points for exercise.  Also discuss use of nitroglycerin and activating EMS.  Review options of places to exercise outside of rehab.  Review guidelines for sex with heart disease.   Cardiac Drugs I:  -Group instruction provided by verbal instruction and written materials to support subject.  Instructor reviews cardiac drug classes: antiplatelets, anticoagulants, beta blockers, and statins.  Instructor discusses reasons, side effects, and lifestyle considerations for each drug class.   Cardiac Drugs II:  -Group instruction provided by verbal instruction and written materials to support subject.  Instructor reviews cardiac drug classes: angiotensin converting enzyme inhibitors (ACE-I), angiotensin II receptor blockers (ARBs), nitrates, and calcium channel blockers.  Instructor discusses reasons, side effects, and lifestyle considerations for each drug class.   Anatomy and Physiology of the Circulatory System:  -Group instruction provided by verbal instruction, video, and written materials to support subject.  Reviews functional anatomy of heart, how it relates to various diagnoses, and what role the heart plays in the overall system.   Knowledge Questionnaire Score:     Knowledge Questionnaire Score - 03/19/16 1113    Knowledge Questionnaire Score   Pre Score 19/24      Core Components/Risk Factors/Patient Goals at Admission:     Personal Goals and Risk Factors at Admission - 03/19/16 1141  Core Components/Risk  Factors/Patient Goals on Admission    Weight Management Yes;Weight Loss   Intervention Weight Management: Provide education and appropriate resources to help participant work on and attain dietary goals.;Weight Management: Develop a combined nutrition and exercise program designed to reach desired caloric intake, while maintaining appropriate intake of nutrient and fiber, sodium and fats, and appropriate energy expenditure required for the weight goal.   Expected Outcomes Short Term: Continue to assess and modify interventions until short term weight is achieved;Long Term: Adherence to nutrition and physical activity/exercise program aimed toward attainment of established weight goal;Weight Maintenance: Understanding of the daily nutrition guidelines, which includes 25-35% calories from fat, 7% or less cal from saturated fats, less than 200mg  cholesterol, less than 1.5gm of sodium, & 5 or more servings of fruits and vegetables daily;Weight Loss: Understanding of general recommendations for a balanced deficit meal plan, which promotes 1-2 lb weight loss per week and includes a negative energy balance of [phone] kcal/d;Understanding recommendations for meals to include 15-35% energy as protein, 25-35% energy from fat, 35-60% energy from carbohydrates, less than 200mg  of dietary cholesterol, 20-35 gm of total fiber daily   Increase Strength and Stamina Yes   Hypertension Yes   Intervention Provide education on lifestyle modifcations including regular physical activity/exercise, weight management, moderate sodium restriction and increased consumption of fresh fruit, vegetables, and low fat dairy, alcohol moderation, and smoking cessation.;Monitor prescription use compliance.   Expected Outcomes Short Term: Continued assessment and intervention until BP is < 140/24mm HG in hypertensive participants. < 130/29mm HG in hypertensive participants with diabetes, heart failure or chronic kidney disease.;Long Term:  Maintenance of blood pressure at goal levels.   Lipids Yes   Intervention Provide education and support for participant on nutrition & aerobic/resistive exercise along with prescribed medications to achieve LDL 70mg , HDL >40mg .   Expected Outcomes Short Term: Participant states understanding of desired cholesterol values and is compliant with medications prescribed. Participant is following exercise prescription and nutrition guidelines.;Long Term: Cholesterol controlled with medications as prescribed, with individualized exercise RX and with personalized nutrition plan. Value goals: LDL < 70mg , HDL > 40 mg.   Intervention Offer individual and/or small group education and counseling on adjustment to heart disease, stress management and health-related lifestyle change. Teach and support self-help strategies.;Refer participants experiencing significant psychosocial distress to appropriate mental health specialists for further evaluation and treatment. When possible, include family members and significant others in education/counseling sessions.   Expected Outcomes Short Term: Participant demonstrates changes in health-related behavior, relaxation and other stress management skills, ability to obtain effective social support, and compliance with psychotropic medications if prescribed.;Long Term: Emotional wellbeing is indicated by absence of clinically significant psychosocial distress or social isolation.      Core Components/Risk Factors/Patient Goals Review:    Core Components/Risk Factors/Patient Goals at Discharge (Final Review):    ITP Comments:     ITP Comments      03/19/16 0951           ITP Comments Dr. Fransico Him, Medical Director          Comments: Pt is making expected progress toward personal goals after completing 4 sessions, pt with  severe debiliation is making great effort during her sessions.  Pt absence due to previously scheduled appointments and vacation.  Recommend  continued exercise and life style modification education including  stress management and relaxation techniques to decrease cardiac risk profile.

## 2016-04-03 NOTE — Progress Notes (Signed)
Reviewed home exercise with pt today.  Pt plans to do a walking program on saturdays, in addition to cardiac rehab. Pt and I discussed walking a total time of 5 minutes and progress by 1 minute per week. Walking will only be [facility]atur[facility] at [facility], due to already doing cardiac rehab 2-3x/week. Reviewed THR, pulse, RPE, sign and symptoms, NTG use, and when to call 911 or MD.  Also discussed weather considerations and indoor options.  Pt voiced understanding.   Olie Scaffidi [employer]

## 2016-04-06 ENCOUNTER — Encounter (HOSPITAL_COMMUNITY)
Admission: RE | Admit: 2016-04-06 | Discharge: 2016-04-06 | Disposition: A | Discharge status: Home / Self Care | Payer: Medicare Other | Source: Ambulatory Visit | Attending: Interventional Cardiology | Admitting: Interventional Cardiology

## 2016-04-06 ENCOUNTER — Telehealth: Payer: Self-pay | Admitting: Cardiology

## 2016-04-06 DIAGNOSIS — Z955 Presence of coronary angioplasty implant and graft: Secondary | ICD-10-CM

## 2016-04-06 NOTE — Progress Notes (Signed)
Incomplete Session Note  Patient Details  Name: [name] [name] MRN: [id number] Date of Birth: February 19, 1951 Referring Provider:        CARDIAC REHAB PHASE II ORIENTATION from 03/19/2016 in [place]   Referring Provider  Smith,Henry MD      [name] did not complete her rehab session.  Adalene came in with her face reddened and patient reports feeling an itching on her face.  [name] lips appear swollen.  [name] says she was given something that may be having a reaction to. Patient said she went to have her dialysis site opened. Blood pressure 108/70. Heat rate 62. Oxygen saturation 96% on room air.Cecilie Kicks FNP-called and notified. Mickel Baas advised that the patient be taken to the ED for further evaluation. Patient's sister present. [name] told me she did not want to go to the emergency room and that her face would clear up in about two weeks. "I had this happen to me before and I was in the emergency room for 10 hours." [name] signed a leaving the hospital against medical advise form. Patient and her sister instructed to follow up with her primary care physician for follow up and to get clearance from her provider when she may return to exercise at cardiac rehab. Cecilie Kicks FNP-C called and notified. [facility] was also called and notified. The ED was also called and notified. The patient was taken to [place] parking with her sister via wheelchair. Patient denies having any shortness of breath

## 2016-04-06 NOTE — Telephone Encounter (Signed)
Pt presented to rehab with possible allergic reaction from something at ?dialysis, with rash and lips swollen, pt to go to ER for eval.

## 2016-04-08 ENCOUNTER — Encounter (HOSPITAL_COMMUNITY): Payer: Medicare Other

## 2016-04-10 ENCOUNTER — Encounter (HOSPITAL_COMMUNITY): Payer: Medicare Other

## 2016-04-14 ENCOUNTER — Telehealth (HOSPITAL_COMMUNITY): Payer: Self-pay | Admitting: *Deleted

## 2016-04-14 ENCOUNTER — Telehealth (HOSPITAL_COMMUNITY): Payer: Self-pay | Admitting: Cardiac Rehabilitation

## 2016-04-14 NOTE — Telephone Encounter (Signed)
pc to pt to advise of clearance to return to cardiac rehab. Left message with pt sister Manus Gunning.

## 2016-04-15 ENCOUNTER — Encounter (HOSPITAL_COMMUNITY)
Admission: RE | Admit: 2016-04-15 | Discharge: 2016-04-15 | Disposition: A | Discharge status: Home / Self Care | Payer: Medicare Other | Source: Ambulatory Visit | Attending: Interventional Cardiology | Admitting: Interventional Cardiology

## 2016-04-15 DIAGNOSIS — Z955 Presence of coronary angioplasty implant and graft: Secondary | ICD-10-CM

## 2016-04-17 ENCOUNTER — Ambulatory Visit: Payer: Medicare Other | Admitting: Interventional Cardiology

## 2016-04-17 ENCOUNTER — Encounter (HOSPITAL_COMMUNITY)
Admission: RE | Admit: 2016-04-17 | Discharge: 2016-04-17 | Disposition: A | Discharge status: Home / Self Care | Payer: Medicare Other | Source: Ambulatory Visit | Attending: Interventional Cardiology | Admitting: Interventional Cardiology

## 2016-04-17 DIAGNOSIS — E871 Hypo-osmolality and hyponatremia: Secondary | ICD-10-CM | POA: Diagnosis not present

## 2016-04-17 DIAGNOSIS — E1122 Type 2 diabetes mellitus with diabetic chronic kidney disease: Secondary | ICD-10-CM | POA: Insufficient documentation

## 2016-04-17 DIAGNOSIS — Z7982 Long term (current) use of aspirin: Secondary | ICD-10-CM | POA: Insufficient documentation

## 2016-04-17 DIAGNOSIS — Z79899 Other long term (current) drug therapy: Secondary | ICD-10-CM | POA: Diagnosis not present

## 2016-04-17 DIAGNOSIS — E785 Hyperlipidemia, unspecified: Secondary | ICD-10-CM | POA: Diagnosis not present

## 2016-04-17 DIAGNOSIS — I2510 Atherosclerotic heart disease of native coronary artery without angina pectoris: Secondary | ICD-10-CM | POA: Diagnosis not present

## 2016-04-17 DIAGNOSIS — I1311 Hypertensive heart and chronic kidney disease without heart failure, with stage 5 chronic kidney disease, or end stage renal disease: Secondary | ICD-10-CM | POA: Insufficient documentation

## 2016-04-17 DIAGNOSIS — N186 End stage renal disease: Secondary | ICD-10-CM | POA: Diagnosis not present

## 2016-04-17 DIAGNOSIS — Z955 Presence of coronary angioplasty implant and graft: Secondary | ICD-10-CM | POA: Insufficient documentation

## 2016-04-17 NOTE — Progress Notes (Signed)
QUALITY OF LIFE SCORE REVIEW  Pt completed Quality of Life survey as a participant in Cardiac Rehab. Scores 21.0 or below are considered low. Pt score very low in several areas Overall 16.4 , Health and Function 17.5, socioeconomic 14.2, physiological and spiritual 13.9, family-20. Patient quality of life altered by physical constraints which limits ability to perform as prior to recent cardiac illness.  Pt is chronically ill with end stage renal disease which has been life altering and disabling. Pt also suffered loss of her spouse this year.  Pt has moved into apartment with her sister which has been an adjustment. However pt exhibits positive coping skills and hopeful outlook. Pt is very eager to participate in cardiac rehab activities and is disappointed if exercise is cancelled or delayed.   Offered emotional support and reassurance.  Will continue to monitor and intervene as necessary.

## 2016-04-20 ENCOUNTER — Encounter (HOSPITAL_COMMUNITY)
Admission: RE | Admit: 2016-04-20 | Discharge: 2016-04-20 | Disposition: A | Discharge status: Home / Self Care | Payer: Medicare Other | Source: Ambulatory Visit | Attending: Interventional Cardiology | Admitting: Interventional Cardiology

## 2016-04-20 DIAGNOSIS — Z955 Presence of coronary angioplasty implant and graft: Secondary | ICD-10-CM | POA: Diagnosis not present

## 2016-04-22 ENCOUNTER — Encounter (HOSPITAL_COMMUNITY)
Admission: RE | Admit: 2016-04-22 | Discharge: 2016-04-22 | Disposition: A | Discharge status: Home / Self Care | Payer: Medicare Other | Source: Ambulatory Visit | Attending: Interventional Cardiology | Admitting: Interventional Cardiology

## 2016-04-22 DIAGNOSIS — Z955 Presence of coronary angioplasty implant and graft: Secondary | ICD-10-CM

## 2016-04-22 NOTE — Progress Notes (Signed)
[name] [name] 65 y.o. female Nutrition Note Spoke with pt. Nutrition Plan and Nutrition Survey goals reviewed with pt. Pt is following a renal, diabetic, Step 1 of the Therapeutic Lifestyle Changes diet. Pt lives at [facility], which provides most of pt's meals. Pt wants to lose wt. Pt with slow, wt loss over the past year. Pt c/o decreased appetite due to loss of husband and "one of the medications I have to take before dialysis takes my appetite away." Pt feels her appetite is improving. Pt is a diet-controlled diabetic. Last A1c indicates blood glucose well-controlled. Pt expressed understanding of the information reviewed. Pt aware of nutrition education classes offered and is unable to attend nutrition classes due to dialysis.  Lab Results  Component Value Date   HGBA1C 5.2 02/24/2016   Wt Readings from Last 3 Encounters:  03/19/16 160 lb 15 oz (73 kg)  02/24/16 164 lb (74.39 kg)  02/14/16 166 lb 6.4 oz (75.479 kg)    Nutrition Diagnosis ? Food-and nutrition-related knowledge deficit related to lack of exposure to information as related to diagnosis of: ? CVD ? DM ? Overweight related to excessive energy intake as evidenced by a BMI of 29.5  Nutrition RX/ Estimated Daily Nutrition Needs for: wt loss 1200-1500 Kcal, 30-40 gm fat, 9-11 gm sat fat, 1.5-1.5 gm trans-fat, <1500 mg sodium, 150 gm CHO   Nutrition Intervention ? Pt's individual nutrition plan reviewed with pt. ? Benefits of adopting Therapeutic Lifestyle Changes discussed when Medficts reviewed. ? Pt to attend the Portion Distortion class ? Pt to attend the Diabetes Q & A class  ? Pt given handouts for: ? Nutrition I class ? Nutrition II class ? Diabetes Blitz Class ? Continue client-centered nutrition education by RD, as part of interdisciplinary care. Goal(s) ? Pt to identify and limit food sources of saturated fat, trans fat, and sodium ? Pt to identify food quantities necessary to achieve weight  loss of 6-24 lb (2.7-10.9 kg) at graduation from cardiac rehab.  Monitor and Evaluate progress toward nutrition goal with team. Derek Mound, M.Ed, RD, LDN, CDE 04/22/2016 2:57 PM

## 2016-04-24 ENCOUNTER — Encounter (HOSPITAL_COMMUNITY)
Admission: RE | Admit: 2016-04-24 | Discharge: 2016-04-24 | Disposition: A | Discharge status: Home / Self Care | Payer: Medicare Other | Source: Ambulatory Visit | Attending: Interventional Cardiology | Admitting: Interventional Cardiology

## 2016-04-24 DIAGNOSIS — Z955 Presence of coronary angioplasty implant and graft: Secondary | ICD-10-CM

## 2016-04-27 ENCOUNTER — Encounter (HOSPITAL_COMMUNITY)
Admission: RE | Admit: 2016-04-27 | Discharge: 2016-04-27 | Disposition: A | Discharge status: Home / Self Care | Payer: Medicare Other | Source: Ambulatory Visit | Attending: Interventional Cardiology | Admitting: Interventional Cardiology

## 2016-04-27 DIAGNOSIS — Z955 Presence of coronary angioplasty implant and graft: Secondary | ICD-10-CM

## 2016-04-29 ENCOUNTER — Encounter (HOSPITAL_COMMUNITY)
Admission: RE | Admit: 2016-04-29 | Discharge: 2016-04-29 | Disposition: A | Discharge status: Home / Self Care | Payer: Medicare Other | Source: Ambulatory Visit | Attending: Interventional Cardiology | Admitting: Interventional Cardiology

## 2016-04-29 DIAGNOSIS — Z955 Presence of coronary angioplasty implant and graft: Secondary | ICD-10-CM | POA: Diagnosis not present

## 2016-05-01 ENCOUNTER — Encounter (HOSPITAL_COMMUNITY)
Admission: RE | Admit: 2016-05-01 | Discharge: 2016-05-01 | Disposition: A | Discharge status: Home / Self Care | Payer: Medicare Other | Source: Ambulatory Visit | Attending: Interventional Cardiology | Admitting: Interventional Cardiology

## 2016-05-01 DIAGNOSIS — Z955 Presence of coronary angioplasty implant and graft: Secondary | ICD-10-CM

## 2016-05-04 ENCOUNTER — Encounter (HOSPITAL_COMMUNITY)
Admission: RE | Admit: 2016-05-04 | Discharge: 2016-05-04 | Disposition: A | Discharge status: Home / Self Care | Payer: Medicare Other | Source: Ambulatory Visit | Attending: Interventional Cardiology | Admitting: Interventional Cardiology

## 2016-05-04 DIAGNOSIS — Z955 Presence of coronary angioplasty implant and graft: Secondary | ICD-10-CM | POA: Diagnosis not present

## 2016-05-06 ENCOUNTER — Encounter (HOSPITAL_COMMUNITY): Payer: Medicare Other

## 2016-05-08 ENCOUNTER — Encounter (HOSPITAL_COMMUNITY)
Admission: RE | Admit: 2016-05-08 | Discharge: 2016-05-08 | Disposition: A | Discharge status: Home / Self Care | Payer: Medicare Other | Source: Ambulatory Visit | Attending: Interventional Cardiology | Admitting: Interventional Cardiology

## 2016-05-08 DIAGNOSIS — Z955 Presence of coronary angioplasty implant and graft: Secondary | ICD-10-CM | POA: Diagnosis not present

## 2016-05-08 NOTE — Progress Notes (Addendum)
Cardiac Individual Treatment Plan  Patient Details  Name: [name] [name] MRN: [id number] Date of Birth: Nov 15, 1951 Referring Provider:        CARDIAC REHAB PHASE II ORIENTATION from 03/19/2016 in [facility]   Referring Provider  Smith,Henry MD      Initial Encounter Date:       CARDIAC REHAB PHASE II ORIENTATION from 03/19/2016 in [facility]   Date  03/19/16   Referring Provider  Smith,Henry MD      Visit Diagnosis: No diagnosis found.  Patient's Home Medications on Admission:  Current outpatient prescriptions:  .  acetaminophen (TYLENOL) 500 MG tablet, Take 500 mg by mouth every 6 (six) hours as needed for headache., Disp: , Rfl:  .  aspirin 81 MG EC tablet, Take 1 tablet (81 mg total) by mouth daily., Disp: 30 tablet, Rfl: 0 .  atorvastatin (LIPITOR) 40 MG tablet, Take 1 tablet (40 mg total) by mouth every morning., Disp: 30 tablet, Rfl: 2 .  calcium carbonate (OS-CAL - DOSED IN MG OF ELEMENTAL CALCIUM) 1250 (500 CA) MG tablet, Take two (2) tablets by mouth three (3) times daily with meals and take two (2) tablets by mouth with snacks., Disp: , Rfl:  .  chlorpheniramine-HYDROcodone (TUSSIONEX PENNKINETIC ER) 10-8 MG/5ML SUER, Take 5 mLs by mouth every 12 (twelve) hours as needed for cough., Disp: 115 mL, Rfl: 0 .  clopidogrel (PLAVIX) 75 MG tablet, Take 75 mg by mouth once. Reported on 03/25/2016, Disp: , Rfl: 0 .  docusate sodium (COLACE) 100 MG capsule, Take 100 mg by mouth daily as needed for mild constipation., Disp: , Rfl:  .  fluticasone (FLONASE) 50 MCG/ACT nasal spray, Place 1 spray into both nostrils daily as needed for allergies or rhinitis. Reported on 03/25/2016, Disp: , Rfl:  .  isosorbide mononitrate (ISMO,MONOKET) 10 MG tablet, Take 1.5 tablets (15 mg total) by mouth daily. (Patient not taking: Reported on 03/19/2016), Disp: 30 tablet, Rfl: 0 .  lanthanum (FOSRENOL) 1000 MG chewable tablet, Chew 1,000 mg by  mouth 3 (three) times daily with meals. Reported on 03/19/2016, Disp: , Rfl:  .  loratadine (CLARITIN) 10 MG tablet, Take 10 mg by mouth daily., Disp: , Rfl:  .  midodrine (PROAMATINE) 10 MG tablet, Take one (1) tablet (10 mg total) by mouth at bedtime on Mondays, Wednesdays, and Fridays. Take one (1) tablet (10 mg total) by mouth pre-dialysis on Tuesdays, Thursdays, and Saturdays., Disp: , Rfl: 3 .  multivitamin (RENA-VIT) TABS tablet, Take 1 tablet by mouth at bedtime., Disp: 90 tablet, Rfl: 3 .  nitroGLYCERIN (NITROSTAT) 0.4 MG SL tablet, Place 1 tablet (0.4 mg total) under the tongue every 5 (five) minutes as needed for chest pain., Disp: 5 tablet, Rfl: 3 .  ondansetron (ZOFRAN) 4 MG tablet, Take 4 mg by mouth every 6 (six) hours as needed for nausea or vomiting. Reported on 02/24/2016, Disp: , Rfl:  .  pantoprazole (PROTONIX) 40 MG tablet, Take 1 tablet (40 mg total) by mouth daily. (Patient not taking: Reported on 03/19/2016), Disp: 30 tablet, Rfl: 0 .  promethazine (PHENERGAN) 25 MG tablet, Take 1 tablet (25 mg total) by mouth every 6 (six) hours as needed for nausea or vomiting., Disp: 30 tablet, Rfl: 0 .  ticagrelor (BRILINTA) 90 MG TABS tablet, Take 1 tablet (90 mg total) by mouth 2 (two) times daily., Disp: 60 tablet, Rfl: 6  Past Medical History: Past Medical History  Diagnosis Date  .  Hearing difficulty     Mild   . Diabetes mellitus ([place])   . Coronary artery disease     a. 6/16 Cath: Severe multivessel dzs->not surg candidate 2/2 debility/poor venous conduits->Med Rx; b. 01/2016 Cath/PCI: LM 50ost, LAD 74m (2.5x32 & 2.5x20 Promus Prem DES'), D1 70ost, D2 90, LCX 95p/m (3.0x16 Promus Prem DES), 65d, RCA 60p->d, RPDA 95 (2.5x12 Promus Prem DES).  . ESRD (end stage renal disease) on dialysis ([place])        . Hypertension   . Ischemic cardiomyopathy     a. Echo 04/2015: EF 35%. Cath same time - 45%.  . Syncope   . Hyponatremia   . Anemia   . Thrombocytopenia ([place])   . Memory loss   .  Hyperlipidemia     Tobacco Use: History  Smoking status  . Never Smoker   Smokeless tobacco  . Never Used    Labs: Recent Review Flowsheet Data    Labs for ITP Cardiac and Pulmonary Rehab Latest Ref Rng 06/12/2014 04/25/2015 08/06/2015 09/23/2015 02/24/2016   Cholestrol 125 - 200 mg/dL 191 - - 93(L) -   LDLCALC <130 mg/dL 117(H) - - 43 -   HDL >=46 mg/dL 51 - - 34(L) -   Trlycerides <150 mg/dL 115 - - 81 -   Hemoglobin A1c - 6.1 - 5.4 - 5.2   PHART 7.350 - 7.450 - 7.413 - - -   PCO2ART 35.0 - 45.0 mmHg - 37.9 - - -   HCO3 20.0 - 24.0 mEq/L - 23.9 - - -   TCO2 0 - 100 mmol/L - 25.1 - - -   ACIDBASEDEF 0.0 - 2.0 mmol/L - 0.2 - - -   O2SAT - - 86.7 - - -      Capillary Blood Glucose: Lab Results  Component Value Date   GLUCAP 101* 02/04/2016   GLUCAP 152* 02/04/2016   GLUCAP 211* 02/03/2016   GLUCAP 178* 02/03/2016   GLUCAP 86 02/03/2016     Exercise Target Goals:    Exercise Program Goal: Individual exercise prescription set with THRR, safety & activity barriers. Participant demonstrates ability to understand and report RPE using BORG scale, to self-measure pulse accurately, and to acknowledge the importance of the exercise prescription.  Exercise Prescription Goal: Starting with aerobic activity 30 plus minutes a day, 3 days per week for initial exercise prescription. Provide home exercise prescription and guidelines that participant acknowledges understanding prior to discharge.  Activity Barriers & Risk Stratification:     Activity Barriers & Cardiac Risk Stratification - 03/19/16 0952    Activity Barriers & Cardiac Risk Stratification   Activity Barriers Back Problems;Joint Problems;Muscular Weakness;Assistive Device   Cardiac Risk Stratification High      6 Minute Walk:     6 Minute Walk      03/19/16 1117       6 Minute Walk   Phase Initial     Distance 512 feet     Walk Time 4 minutes     # of Rest Breaks 1  2 min     MPH 1.5     METS 1.5      RPE 13     VO2 Peak 5.14     Symptoms Yes (comment)     Comments fatigue     Resting HR 70 bpm     Resting BP 106/64 mmHg     Max Ex. HR 81 bpm     Max Ex. BP 148/84 mmHg  2 Minute Post BP 126/80 mmHg        Initial Exercise Prescription:     Initial Exercise Prescription - 03/19/16 1100    Date of Initial Exercise RX and Referring Provider   Date 03/19/16   Referring Provider Smith,Henry MD   Recumbant Bike   Level 1   Minutes 10   METs 1.2   NuStep   Level 1   Minutes 10   METs 1.2   Track   Laps 5   Minutes 10   METs 1.2   Intensity   THRR 40-80% of Max Heartrate 62-123   Ratings of Perceived Exertion 11-13   Perceived Dyspnea 0-4   Progression   Progression Continue to progress workloads to maintain intensity without signs/symptoms of physical distress.   Resistance Training   Training Prescription Yes   Weight 1lb   Reps 10-12      Perform Capillary Blood Glucose checks as needed.  Exercise Prescription Changes:     Exercise Prescription Changes      04/03/16 1500 04/20/16 1700 04/30/16 1600       Exercise Review   Progression Yes Yes Yes     Response to Exercise   Blood Pressure (Admit) 116/62 mmHg 120/60 mmHg 110/60 mmHg     Blood Pressure (Exercise) 120/70 mmHg 114/60 mmHg 122/72 mmHg     Blood Pressure (Exit) 120/70 mmHg 140/70 mmHg 122/70 mmHg     Heart Rate (Admit) 67 bpm 67 bpm 66 bpm     Heart Rate (Exercise) 88 bpm 81 bpm 71 bpm     Heart Rate (Exit) 62 bpm 67 bpm 71 bpm     Rating of Perceived Exertion (Exercise) 11 9 9      Duration Progress to 30 minutes of continuous aerobic without signs/symptoms of physical distress Progress to 30 minutes of continuous aerobic without signs/symptoms of physical distress Progress to 30 minutes of continuous aerobic without signs/symptoms of physical distress     Intensity THRR unchanged THRR unchanged THRR unchanged     Progression   Progression Continue to progress workloads to maintain intensity  without signs/symptoms of physical distress. Continue to progress workloads to maintain intensity without signs/symptoms of physical distress. Continue to progress workloads to maintain intensity without signs/symptoms of physical distress.     Average METs 1.8 1.9 1.9     Resistance Training   Training Prescription Yes Yes Yes     Weight 1lb 2llbs 2lbs     Reps 10-12 10-12 10-12     Recumbant Bike   METs   2     NuStep   Level 1 1 1      Minutes 25 30 30      METs 1.8 1.9 1.9     Home Exercise Plan   Plans to continue exercise at [employer] (comment)  [facility] - group exercise/ short walking bouts [employer] [facility] [employer] (comment)  [place] - group exercise/ short walking bouts [employer] (comment)  [facility] - group exercise/ short walking bouts     Frequency Add 1 additional day to program exercise sessions.  incrementally increase walk distance... only on saturdays Add 1 additional day to program exercise sessions.  incrementally increase walk distance... only on saturdays Add 1 additional day to program exercise sessions.  incrementally increase walk distance... only on saturdays        Exercise Comments:     Exercise Comments      04/03/16 1603 04/30/16 1653  Exercise Comments Adjusted pt Ex Rx due to pain and discomfort. pt feels more comfortable on the Nustep. pt did 25 minutes on Nustep without pain or discomfort Reviewed METs and goals. Pt is tolerating exercise well; will continue to monitor exercise progression.         Discharge Exercise Prescription (Final Exercise Prescription Changes):     Exercise Prescription Changes - 04/30/16 1600    Exercise Review   Progression Yes   Response to Exercise   Blood Pressure (Admit) 110/60 mmHg   Blood Pressure (Exercise) 122/72 mmHg   Blood Pressure (Exit) 122/70 mmHg   Heart Rate (Admit) 66 bpm   Heart Rate (Exercise) 71 bpm   Heart Rate (Exit) 71 bpm   Rating of Perceived Exertion  (Exercise) 9   Duration Progress to 30 minutes of continuous aerobic without signs/symptoms of physical distress   Intensity THRR unchanged   Progression   Progression Continue to progress workloads to maintain intensity without signs/symptoms of physical distress.   Average METs 1.9   Resistance Training   Training Prescription Yes   Weight 2lbs   Reps 10-12   Recumbant Bike   METs 2   NuStep   Level 1   Minutes 30   METs 1.9   Home Exercise Plan   Plans to continue exercise at [facility] [employer] (comment)  [facility] - group exercise/ short walking bouts   Frequency Add 1 additional day to program exercise sessions.  incrementally increase walk distance... only on saturdays      Nutrition:  Target Goals: Understanding of nutrition guidelines, daily intake of sodium 1500mg , cholesterol 200mg , calories 30% from fat and 7% or less from saturated fats, daily to have 5 or more servings of fruits and vegetables.  Biometrics:     Pre Biometrics - 03/19/16 1125    Pre Biometrics   Waist Circumference 40 inches   Hip Circumference 45.25 inches   Waist to Hip Ratio 0.88 %   Triceps Skinfold 19 mm   % Body Fat 31 %   Grip Strength 22 kg   Flexibility 0 in   Single Leg Stand 0 seconds       Nutrition Therapy Plan and Nutrition Goals:     Nutrition Therapy & Goals - 03/20/16 0902    Nutrition Therapy   Diet Renal, Diabetic, Therapeutic Lifestyle Change   Personal Nutrition Goals   Personal Goal #1 0.5-2 lb wt loss/wk to a goal wt loss of 6-15 lb at graduation from [facility], educate and counsel regarding individualized specific dietary modifications aiming towards targeted core components such as weight, hypertension, lipid management, diabetes, heart failure and other comorbidities.   Expected Outcomes Short Term Goal: Understand basic principles of dietary content, such as calories, fat, sodium, cholesterol and  nutrients.;Long Term Goal: Adherence to prescribed nutrition plan.      Nutrition Discharge: Nutrition Scores:     Nutrition Assessments - 04/22/16 1453    MEDFICTS Scores   Pre Score 54      Nutrition Goals Re-Evaluation:   Psychosocial: Target Goals: Acknowledge presence or absence of depression, maximize coping skills, provide positive support system. Participant is able to verbalize types and ability to use techniques and skills needed for reducing stress and depression.  Initial Review & Psychosocial Screening:     Initial Psych Review & Screening - 03/19/16 [place]? Yes   Concerns Recent loss  of significant other   Comments husband passed away 07-27-15   Barriers   Psychosocial barriers to participate in program There are no identifiable barriers or psychosocial needs.   Screening Interventions   Interventions Encouraged to exercise      Quality of Life Scores:     Quality of Life - 03/19/16 1143    Quality of Life Scores   Health/Function Pre 17.54 %   Socioeconomic Pre 14.2 %   Psych/Spiritual Pre 13.92 %   Family Pre 20 %   GLOBAL Pre 16.43 %      PHQ-9:     Recent Review Flowsheet Data    Depression screen [facility] 2/9 03/23/2016 02/24/2016 12/11/2015 09/16/2015 09/06/2015   Decreased Interest 0 0 1  0 1    Down, Depressed, Hopeless 2 0 0 0 1   PHQ - 2 Score 2 0 1 0 2   Altered sleeping 1 - - - -   Tired, decreased energy 1 - - - -   Change in appetite 2 - - - -   Feeling bad or failure about yourself  1 - - - -   Trouble concentrating 1 - - - -   Moving slowly or fidgety/restless 1 - - - -   Suicidal thoughts 0 - - - -   PHQ-9 Score 9 - - - -   Difficult doing work/chores Somewhat difficult - - - -      Psychosocial Evaluation and Intervention:     Psychosocial Evaluation - 04/17/16 1721    Psychosocial Evaluation & Interventions   Interventions Stress management education;Relaxation education;Encouraged  to exercise with the program and follow exercise prescription   Continued Psychosocial Services Needed Yes      Psychosocial Re-Evaluation:     Psychosocial Re-Evaluation      04/17/16 1721 04/22/16 1711         Psychosocial Re-Evaluation   Interventions Stress management education;Relaxation education;Encouraged to attend Cardiac Rehabilitation for the exercise       Comments  pt feels her legs are stronger and able to participate in more activities at home. pt reports tripping over her dog causing her to fall at home a few weeks ago. pt denies injury. pt states she used skills learned in functional fitness class to get herself up from floor.       Continued Psychosocial Services Needed Yes Yes         Vocational Rehabilitation: Provide vocational rehab assistance to qualifying candidates.   Vocational Rehab Evaluation & Intervention:     Vocational Rehab - 03/19/16 1504    Initial Vocational Rehab Evaluation & Intervention   Assessment shows need for Vocational Rehabilitation No      Education: Education Goals: Education classes will be provided on a weekly basis, covering required topics. Participant will state understanding/return demonstration of topics presented.  Learning Barriers/Preferences:     Learning Barriers/Preferences - 03/19/16 0953    Learning Barriers/Preferences   Learning Barriers Sight;Hearing  have reading glasses and hearing aid   Learning Preferences Written Material;Skilled Demonstration      Education Topics: Count Your Pulse:  -Group instruction provided by verbal instruction, demonstration, patient participation and written materials to support subject.  Instructors address importance of being able to find your pulse and how to count your pulse when at home without a heart monitor.  Patients get hands on experience counting their pulse with staff help and individually.          CARDIAC REHAB  PHASE II EXERCISE from 04/29/2016 in [facility]   Date  04/17/16   Instruction Review Code  2- meets goals/outcomes      Heart Attack, Angina, and Risk Factor Modification:  -Group instruction provided by verbal instruction, video, and written materials to support subject.  Instructors address signs and symptoms of angina and heart attacks.    Also discuss risk factors for heart disease and how to make changes to improve heart health risk factors.   Functional Fitness:  -Group instruction provided by verbal instruction, demonstration, patient participation, and written materials to support subject.  Instructors address safety measures for doing things around the house.  Discuss how to get up and down off the floor, how to pick things up properly, how to safely get out of a chair without assistance, and balance training.      CARDIAC REHAB PHASE II EXERCISE from 04/29/2016 in [facility]   Date  04/03/16   Educator  Seward Carol, [place], ACSM CCES   Instruction Review Code  R- Review/reinforce      Meditation and Mindfulness:  -Group instruction provided by verbal instruction, patient participation, and written materials to support subject.  Instructor addresses importance of mindfulness and meditation practice to help reduce stress and improve awareness.  Instructor also leads participants through a meditation exercise.    Stretching for Flexibility and Mobility:  -Group instruction provided by verbal instruction, patient participation, and written materials to support subject.  Instructors lead participants through series of stretches that are designed to increase flexibility thus improving mobility.  These stretches are additional exercise for major muscle groups that are typically performed during regular warm up and cool down.   Hands Only CPR Anytime:  -Group instruction provided by verbal instruction, video, patient participation and written materials to support  subject.  Instructors co-teach with AHA video for hands only CPR.  Participants get hands on experience with mannequins.   Nutrition I class: Heart Healthy Eating:  -Group instruction provided by PowerPoint slides, verbal discussion, and written materials to support subject matter. The instructor gives an explanation and review of the Therapeutic Lifestyle Changes diet recommendations, which includes a discussion on lipid goals, dietary fat, sodium, fiber, plant stanol/sterol esters, sugar, and the components of a well-balanced, healthy diet.      CARDIAC REHAB PHASE II EXERCISE from 04/29/2016 in [facility]   Date  04/22/16   Educator  RD   Instruction Review Code  Not applicable [class handout given]      Nutrition II class: Lifestyle Skills:  -Group instruction provided by PowerPoint slides, verbal discussion, and written materials to support subject matter. The instructor gives an explanation and review of label reading, grocery shopping for heart health, heart healthy recipe modifications, and ways to make healthier choices when eating out.      CARDIAC REHAB PHASE II EXERCISE from 04/29/2016 in [facility] [facility]   Date  04/22/16   Educator  RD   Instruction Review Code  Not applicable [class handouts given]      Diabetes Question & Answer:  -Group instruction provided by PowerPoint slides, verbal discussion, and written materials to support subject matter. The instructor gives an explanation and review of diabetes co-morbidities, pre- and post-prandial blood glucose goals, pre-exercise blood glucose goals, signs, symptoms, and treatment of hypoglycemia and hyperglycemia, and foot care basics.      CARDIAC REHAB PHASE II EXERCISE from  04/29/2016 in [facility]   Date  04/24/16   Educator  RD   Instruction Review Code  2- meets goals/outcomes      Diabetes Blitz:  -Group instruction provided by  PowerPoint slides, verbal discussion, and written materials to support subject matter. The instructor gives an explanation and review of the physiology behind type 1 and type 2 diabetes, diabetes medications and rational behind using different medications, pre- and post-prandial blood glucose recommendations and Hemoglobin A1c goals, diabetes diet, and exercise including blood glucose guidelines for exercising safely.       CARDIAC REHAB PHASE II EXERCISE from 04/29/2016 in [facility]   Date  04/22/16   Educator  RD   Instruction Review Code  Not applicable [class handout given]      Portion Distortion:  -Group instruction provided by PowerPoint slides, verbal discussion, written materials, and food models to support subject matter. The instructor gives an explanation of serving size versus portion size, changes in portions sizes over the last 20 years, and what consists of a serving from each food group.   Stress Management:  -Group instruction provided by verbal instruction, video, and written materials to support subject matter.  Instructors review role of stress in heart disease and how to cope with stress positively.        CARDIAC REHAB PHASE II EXERCISE from 04/29/2016 in [facility]   Date  04/22/16 [t]   Instruction Review Code  2- meets goals/outcomes      Exercising on Your Own:  -Group instruction provided by verbal instruction, power point, and written materials to support subject.  Instructors discuss benefits of exercise, components of exercise, frequency and intensity of exercise, and end points for exercise.  Also discuss use of nitroglycerin and activating EMS.  Review options of places to exercise outside of rehab.  Review guidelines for sex with heart disease.      CARDIAC REHAB PHASE II EXERCISE from 04/29/2016 in [facility]   Date  04/15/16   Educator  Seward Carol, MS,ACSM  CCES   Instruction Review Code  2- meets goals/outcomes      Cardiac Drugs I:  -Group instruction provided by verbal instruction and written materials to support subject.  Instructor reviews cardiac drug classes: antiplatelets, anticoagulants, beta blockers, and statins.  Instructor discusses reasons, side effects, and lifestyle considerations for each drug class.      CARDIAC REHAB PHASE II EXERCISE from 04/29/2016 in [facility]   Date  04/29/16   Educator  pharmD   Instruction Review Code  2- meets goals/outcomes      Cardiac Drugs II:  -Group instruction provided by verbal instruction and written materials to support subject.  Instructor reviews cardiac drug classes: angiotensin converting enzyme inhibitors (ACE-I), angiotensin II receptor blockers (ARBs), nitrates, and calcium channel blockers.  Instructor discusses reasons, side effects, and lifestyle considerations for each drug class.   Anatomy and Physiology of the Circulatory System:  -Group instruction provided by verbal instruction, video, and written materials to support subject.  Reviews functional anatomy of heart, how it relates to various diagnoses, and what role the heart plays in the overall system.   Knowledge Questionnaire Score:     Knowledge Questionnaire Score - 03/19/16 1113    Knowledge Questionnaire Score   Pre Score 19/24      Core Components/Risk Factors/Patient Goals at Admission:  Personal Goals and Risk Factors at Admission - 03/19/16 1141    Core Components/Risk Factors/Patient Goals on Admission    Weight Management Yes;Weight Loss   Intervention Weight Management: Provide education and appropriate resources to help participant work on and attain dietary goals.;Weight Management: Develop a combined nutrition and exercise program designed to reach desired caloric intake, while maintaining appropriate intake of nutrient and fiber, sodium and fats, and appropriate  energy expenditure required for the weight goal.   Expected Outcomes Short Term: Continue to assess and modify interventions until short term weight is achieved;Long Term: Adherence to nutrition and physical activity/exercise program aimed toward attainment of established weight goal;Weight Maintenance: Understanding of the daily nutrition guidelines, which includes 25-35% calories from fat, 7% or less cal from saturated fats, less than 200mg  cholesterol, less than 1.5gm of sodium, & 5 or more servings of fruits and vegetables daily;Weight Loss: Understanding of general recommendations for a balanced deficit meal plan, which promotes 1-2 lb weight loss per week and includes a negative energy balance of [phone] kcal/d;Understanding recommendations for meals to include 15-35% energy as protein, 25-35% energy from fat, 35-60% energy from carbohydrates, less than 200mg  of dietary cholesterol, 20-35 gm of total fiber daily   Increase Strength and Stamina Yes   Hypertension Yes   Intervention Provide education on lifestyle modifcations including regular physical activity/exercise, weight management, moderate sodium restriction and increased consumption of fresh fruit, vegetables, and low fat dairy, alcohol moderation, and smoking cessation.;Monitor prescription use compliance.   Expected Outcomes Short Term: Continued assessment and intervention until BP is < 140/49mm HG in hypertensive participants. < 130/52mm HG in hypertensive participants with diabetes, heart failure or chronic kidney disease.;Long Term: Maintenance of blood pressure at goal levels.   Lipids Yes   Intervention Provide education and support for participant on nutrition & aerobic/resistive exercise along with prescribed medications to achieve LDL 70mg , HDL >40mg .   Expected Outcomes Short Term: Participant states understanding of desired cholesterol values and is compliant with medications prescribed. Participant is following exercise  prescription and nutrition guidelines.;Long Term: Cholesterol controlled with medications as prescribed, with individualized exercise RX and with personalized nutrition plan. Value goals: LDL < 70mg , HDL > 40 mg.   Intervention Offer individual and/or small group education and counseling on adjustment to heart disease, stress management and health-related lifestyle change. Teach and support self-help strategies.;Refer participants experiencing significant psychosocial distress to appropriate mental health specialists for further evaluation and treatment. When possible, include family members and significant others in education/counseling sessions.   Expected Outcomes Short Term: Participant demonstrates changes in health-related behavior, relaxation and other stress management skills, ability to obtain effective social support, and compliance with psychotropic medications if prescribed.;Long Term: Emotional wellbeing is indicated by absence of clinically significant psychosocial distress or social isolation.      Core Components/Risk Factors/Patient Goals Review:      Goals and Risk Factor Review      04/30/16 1654           Core Components/Risk Factors/Patient Goals Review   Personal Goals Review Other;Improve shortness of breath with ADL's       Review pt is able to walk dog with walker for 15 minutes        Expected Outcomes Pt will continue to walk the dog and perform other ADL's with less SOB/ fatigue.          Core Components/Risk Factors/Patient Goals at Discharge (Final Review):      Goals and Risk Factor Review -  04/30/16 1654    Core Components/Risk Factors/Patient Goals Review   Personal Goals Review Other;Improve shortness of breath with ADL's   Review pt is able to walk dog with walker for 15 minutes    Expected Outcomes Pt will continue to walk the dog and perform other ADL's with less SOB/ fatigue.      ITP Comments:     ITP Comments      03/19/16 0951            ITP Comments Dr. Fransico Him, Medical Director          Comments: Pt is making expected progress toward personal goals after completing 14 sessions. Recommend continued exercise and life style modification education including  stress management and relaxation techniques to decrease cardiac risk profile.

## 2016-05-11 ENCOUNTER — Encounter (HOSPITAL_COMMUNITY)
Admission: RE | Admit: 2016-05-11 | Discharge: 2016-05-11 | Disposition: A | Discharge status: Home / Self Care | Payer: Medicare Other | Source: Ambulatory Visit | Attending: Interventional Cardiology | Admitting: Interventional Cardiology

## 2016-05-11 DIAGNOSIS — Z955 Presence of coronary angioplasty implant and graft: Secondary | ICD-10-CM

## 2016-05-13 ENCOUNTER — Encounter (HOSPITAL_COMMUNITY)
Admission: RE | Admit: 2016-05-13 | Discharge: 2016-05-13 | Disposition: A | Discharge status: Home / Self Care | Payer: Medicare Other | Source: Ambulatory Visit | Attending: Interventional Cardiology | Admitting: Interventional Cardiology

## 2016-05-13 DIAGNOSIS — Z955 Presence of coronary angioplasty implant and graft: Secondary | ICD-10-CM | POA: Diagnosis not present

## 2016-05-15 ENCOUNTER — Encounter (HOSPITAL_COMMUNITY)
Admission: RE | Admit: 2016-05-15 | Discharge: 2016-05-15 | Disposition: A | Discharge status: Home / Self Care | Payer: Medicare Other | Source: Ambulatory Visit | Attending: Interventional Cardiology | Admitting: Interventional Cardiology

## 2016-05-15 ENCOUNTER — Encounter: Payer: Self-pay | Admitting: Internal Medicine

## 2016-05-15 DIAGNOSIS — Z955 Presence of coronary angioplasty implant and graft: Secondary | ICD-10-CM | POA: Diagnosis not present

## 2016-05-18 ENCOUNTER — Encounter (HOSPITAL_COMMUNITY): Payer: Medicare Other

## 2016-05-20 ENCOUNTER — Encounter (HOSPITAL_COMMUNITY)
Admission: RE | Admit: 2016-05-20 | Discharge: 2016-05-20 | Disposition: A | Discharge status: Home / Self Care | Payer: Medicare Other | Source: Ambulatory Visit | Attending: Interventional Cardiology | Admitting: Interventional Cardiology

## 2016-05-20 DIAGNOSIS — Z79899 Other long term (current) drug therapy: Secondary | ICD-10-CM | POA: Diagnosis not present

## 2016-05-20 DIAGNOSIS — E1122 Type 2 diabetes mellitus with diabetic chronic kidney disease: Secondary | ICD-10-CM | POA: Insufficient documentation

## 2016-05-20 DIAGNOSIS — I2510 Atherosclerotic heart disease of native coronary artery without angina pectoris: Secondary | ICD-10-CM | POA: Insufficient documentation

## 2016-05-20 DIAGNOSIS — E785 Hyperlipidemia, unspecified: Secondary | ICD-10-CM | POA: Diagnosis not present

## 2016-05-20 DIAGNOSIS — E871 Hypo-osmolality and hyponatremia: Secondary | ICD-10-CM | POA: Insufficient documentation

## 2016-05-20 DIAGNOSIS — Z955 Presence of coronary angioplasty implant and graft: Secondary | ICD-10-CM | POA: Insufficient documentation

## 2016-05-20 DIAGNOSIS — I1311 Hypertensive heart and chronic kidney disease without heart failure, with stage 5 chronic kidney disease, or end stage renal disease: Secondary | ICD-10-CM | POA: Diagnosis not present

## 2016-05-20 DIAGNOSIS — Z7982 Long term (current) use of aspirin: Secondary | ICD-10-CM | POA: Insufficient documentation

## 2016-05-20 DIAGNOSIS — N186 End stage renal disease: Secondary | ICD-10-CM | POA: Insufficient documentation

## 2016-05-22 ENCOUNTER — Encounter (HOSPITAL_COMMUNITY): Payer: Medicare Other

## 2016-05-25 ENCOUNTER — Encounter (HOSPITAL_COMMUNITY)
Admission: RE | Admit: 2016-05-25 | Discharge: 2016-05-25 | Disposition: A | Discharge status: Home / Self Care | Payer: Medicare Other | Source: Ambulatory Visit | Attending: Interventional Cardiology | Admitting: Interventional Cardiology

## 2016-05-25 DIAGNOSIS — Z955 Presence of coronary angioplasty implant and graft: Secondary | ICD-10-CM

## 2016-05-25 NOTE — Progress Notes (Signed)
Pt arrived at cardiac rehab with BP:  160/90.  Recheck BP:  160/98. Pt states she had extra dialysis treatment on Friday and has not felt well since. Pt reports she is on 1L fluid restriction and subsequently only consumed 555ml yesterday. Pt also c/o right sided discomfort which has been present off and on since Saturday. Pt describes as dull ache and worsens with movement and touch. Pt has taken tylenol today.  Pt also c/o fatigue. Pt did not exercise. Offered to take pt to ED.  pt declined.  Pt advised to get some rest and notify MD or go to ED if symptoms unrelieved or worsen.  Understanding verbalized.

## 2016-05-27 ENCOUNTER — Encounter (HOSPITAL_COMMUNITY)
Admission: RE | Admit: 2016-05-27 | Discharge: 2016-05-27 | Disposition: A | Discharge status: Home / Self Care | Payer: Medicare Other | Source: Ambulatory Visit | Attending: Interventional Cardiology | Admitting: Interventional Cardiology

## 2016-05-27 DIAGNOSIS — Z955 Presence of coronary angioplasty implant and graft: Secondary | ICD-10-CM | POA: Diagnosis not present

## 2016-05-29 ENCOUNTER — Encounter (HOSPITAL_COMMUNITY): Admission: RE | Admit: 2016-05-29 | Payer: Medicare Other | Source: Ambulatory Visit

## 2016-05-29 NOTE — Progress Notes (Signed)
Cardiac Individual Treatment Plan  Patient Details  Name: [name] [name] MRN: [id number] Date of Birth: March 07, 1951 Referring Provider:        [facility] from 03/19/2016 in [place]   Referring Provider  [place] MD      Initial Encounter Date:       [facility] from 03/19/2016 in [place]   Date  03/19/16   Referring Provider  Smith,Henry MD      Visit Diagnosis: 02/03/16 Stented coronary artery  Patient's Home Medications on Admission:  Current outpatient prescriptions:  .  acetaminophen (TYLENOL) 500 MG tablet, Take 500 mg by mouth every 6 (six) hours as needed for headache., Disp: , Rfl:  .  aspirin 81 MG EC tablet, Take 1 tablet (81 mg total) by mouth daily., Disp: 30 tablet, Rfl: 0 .  atorvastatin (LIPITOR) 40 MG tablet, Take 1 tablet (40 mg total) by mouth every morning., Disp: 30 tablet, Rfl: 2 .  calcium carbonate (OS-CAL - DOSED IN MG OF ELEMENTAL CALCIUM) 1250 (500 CA) MG tablet, Take two (2) tablets by mouth three (3) times daily with meals and take two (2) tablets by mouth with snacks., Disp: , Rfl:  .  chlorpheniramine-HYDROcodone (TUSSIONEX PENNKINETIC ER) 10-8 MG/5ML SUER, Take 5 mLs by mouth every 12 (twelve) hours as needed for cough., Disp: 115 mL, Rfl: 0 .  clopidogrel (PLAVIX) 75 MG tablet, Take 75 mg by mouth once. Reported on 03/25/2016, Disp: , Rfl: 0 .  docusate sodium (COLACE) 100 MG capsule, Take 100 mg by mouth daily as needed for mild constipation., Disp: , Rfl:  .  fluticasone (FLONASE) 50 MCG/ACT nasal spray, Place 1 spray into both nostrils daily as needed for allergies or rhinitis. Reported on 03/25/2016, Disp: , Rfl:  .  isosorbide mononitrate (ISMO,MONOKET) 10 MG tablet, Take 1.5 tablets (15 mg total) by mouth daily. (Patient not taking: Reported on 03/19/2016), Disp: 30 tablet, Rfl: 0 .  lanthanum (FOSRENOL) 1000 MG chewable tablet, Chew  1,000 mg by mouth 3 (three) times daily with meals. Reported on 03/19/2016, Disp: , Rfl:  .  loratadine (CLARITIN) 10 MG tablet, Take 10 mg by mouth daily., Disp: , Rfl:  .  midodrine (PROAMATINE) 10 MG tablet, Take one (1) tablet (10 mg total) by mouth at bedtime on Mondays, Wednesdays, and Fridays. Take one (1) tablet (10 mg total) by mouth pre-dialysis on Tuesdays, Thursdays, and Saturdays., Disp: , Rfl: 3 .  multivitamin (RENA-VIT) TABS tablet, Take 1 tablet by mouth at bedtime., Disp: 90 tablet, Rfl: 3 .  nitroGLYCERIN (NITROSTAT) 0.4 MG SL tablet, Place 1 tablet (0.4 mg total) under the tongue every 5 (five) minutes as needed for chest pain., Disp: 5 tablet, Rfl: 3 .  ondansetron (ZOFRAN) 4 MG tablet, Take 4 mg by mouth every 6 (six) hours as needed for nausea or vomiting. Reported on 02/24/2016, Disp: , Rfl:  .  pantoprazole (PROTONIX) 40 MG tablet, Take 1 tablet (40 mg total) by mouth daily. (Patient not taking: Reported on 03/19/2016), Disp: 30 tablet, Rfl: 0 .  promethazine (PHENERGAN) 25 MG tablet, Take 1 tablet (25 mg total) by mouth every 6 (six) hours as needed for nausea or vomiting., Disp: 30 tablet, Rfl: 0 .  ticagrelor (BRILINTA) 90 MG TABS tablet, Take 1 tablet (90 mg total) by mouth 2 (two) times daily., Disp: 60 tablet, Rfl: 6  Past Medical History: Past Medical History  Diagnosis  Date  . Hearing difficulty     Mild   . Diabetes mellitus ([place])   . Coronary artery disease     a. 6/16 Cath: Severe multivessel dzs->not surg candidate 2/2 debility/poor venous conduits->Med Rx; b. 01/2016 Cath/PCI: LM 50ost, LAD 26m (2.5x32 & 2.5x20 Promus Prem DES'), D1 70ost, D2 90, LCX 95p/m (3.0x16 Promus Prem DES), 65d, RCA 60p->d, RPDA 95 (2.5x12 Promus Prem DES).  . ESRD (end stage renal disease) on dialysis ([place])        . Hypertension   . Ischemic cardiomyopathy     a. Echo 04/2015: EF 35%. Cath same time - 45%.  . Syncope   . Hyponatremia   . Anemia   . Thrombocytopenia ([place])   . Memory  loss   . Hyperlipidemia     Tobacco Use: History  Smoking status  . Never Smoker   Smokeless tobacco  . Never Used    Labs:     Recent Review Flowsheet Data    Labs for ITP Cardiac and Pulmonary Rehab Latest Ref Rng 06/12/2014 04/25/2015 08/06/2015 09/23/2015 02/24/2016   Cholestrol 125 - 200 mg/dL 191 - - 93(L) -   LDLCALC <130 mg/dL 117(H) - - 43 -   HDL >=46 mg/dL 51 - - 34(L) -   Trlycerides <150 mg/dL 115 - - 81 -   Hemoglobin A1c - 6.1 - 5.4 - 5.2   PHART 7.350 - 7.450 - 7.413 - - -   PCO2ART 35.0 - 45.0 mmHg - 37.9 - - -   HCO3 20.0 - 24.0 mEq/L - 23.9 - - -   TCO2 0 - 100 mmol/L - 25.1 - - -   ACIDBASEDEF 0.0 - 2.0 mmol/L - 0.2 - - -   O2SAT - - 86.7 - - -      Capillary Blood Glucose: Lab Results  Component Value Date   GLUCAP 101* 02/04/2016   GLUCAP 152* 02/04/2016   GLUCAP 211* 02/03/2016   GLUCAP 178* 02/03/2016   GLUCAP 86 02/03/2016     Exercise Target Goals:    Exercise Program Goal: Individual exercise prescription set with THRR, safety & activity barriers. Participant demonstrates ability to understand and report RPE using BORG scale, to self-measure pulse accurately, and to acknowledge the importance of the exercise prescription.  Exercise Prescription Goal: Starting with aerobic activity 30 plus minutes a day, 3 days per week for initial exercise prescription. Provide home exercise prescription and guidelines that participant acknowledges understanding prior to discharge.  Activity Barriers & Risk Stratification:     Activity Barriers & Cardiac Risk Stratification - 03/19/16 0952    Activity Barriers & Cardiac Risk Stratification   Activity Barriers Back Problems;Joint Problems;Muscular Weakness;Assistive Device   Cardiac Risk Stratification High      6 Minute Walk:     6 Minute Walk      03/19/16 1117       6 Minute Walk   Phase Initial     Distance 512 feet     Walk Time 4 minutes     # of Rest Breaks 1  2 min     MPH 1.5      METS 1.5     RPE 13     VO2 Peak 5.14     Symptoms Yes (comment)     Comments fatigue     Resting HR 70 bpm     Resting BP 106/64 mmHg     Max Ex. HR 81 bpm  Max Ex. BP 148/84 mmHg     2 Minute Post BP 126/80 mmHg        Initial Exercise Prescription:     Initial Exercise Prescription - 03/19/16 1100    Date of Initial Exercise RX and Referring Provider   Date 03/19/16   Referring Provider Smith,Henry MD   Recumbant Bike   Level 1   Minutes 10   METs 1.2   NuStep   Level 1   Minutes 10   METs 1.2   Track   Laps 5   Minutes 10   METs 1.2   Intensity   THRR 40-80% of Max Heartrate 62-123   Ratings of Perceived Exertion 11-13   Perceived Dyspnea 0-4   Progression   Progression Continue to progress workloads to maintain intensity without signs/symptoms of physical distress.   Resistance Training   Training Prescription Yes   Weight 1lb   Reps 10-12      Perform Capillary Blood Glucose checks as needed.  Exercise Prescription Changes:      Exercise Prescription Changes      04/03/16 1500 04/20/16 1700 04/30/16 1600 05/18/16 1500 05/28/16 1700   Exercise Review   Progression Yes Yes Yes Yes Yes   Response to Exercise   Blood Pressure (Admit) 116/62 mmHg 120/60 mmHg 110/60 mmHg 122/72 mmHg 140/80 mmHg   Blood Pressure (Exercise) 120/70 mmHg 114/60 mmHg 122/72 mmHg 128/80 mmHg 122/70 mmHg   Blood Pressure (Exit) 120/70 mmHg 140/70 mmHg 122/70 mmHg 116/74 mmHg 130/62 mmHg   Heart Rate (Admit) 67 bpm 67 bpm 66 bpm 66 bpm 70 bpm   Heart Rate (Exercise) 88 bpm 81 bpm 71 bpm 83 bpm 80 bpm   Heart Rate (Exit) 62 bpm 67 bpm 71 bpm 64 bpm 65 bpm   Rating of Perceived Exertion (Exercise) 11 9 9 11 13    Duration Progress to 30 minutes of continuous aerobic without signs/symptoms of physical distress Progress to 30 minutes of continuous aerobic without signs/symptoms of physical distress Progress to 30 minutes of continuous aerobic without signs/symptoms of physical  distress Progress to 30 minutes of continuous aerobic without signs/symptoms of physical distress Progress to 30 minutes of continuous aerobic without signs/symptoms of physical distress   Intensity THRR unchanged THRR unchanged THRR unchanged THRR unchanged THRR unchanged   Progression   Progression Continue to progress workloads to maintain intensity without signs/symptoms of physical distress. Continue to progress workloads to maintain intensity without signs/symptoms of physical distress. Continue to progress workloads to maintain intensity without signs/symptoms of physical distress. Continue to progress workloads to maintain intensity without signs/symptoms of physical distress. Continue to progress workloads to maintain intensity without signs/symptoms of physical distress.   Average METs 1.8 1.9 1.9 2.2 2.3   Resistance Training   Training Prescription Yes Yes Yes Yes Yes   Weight 1lb 2llbs 2lbs 2lbs 2lbs   Reps 10-12 10-12 10-12 10-12 10-12   Recumbant Bike   METs   2     NuStep   Level 1 1 1 3 3    Minutes 25 30 30 30 30    METs 1.8 1.9 1.9 2.2 2.3   Home Exercise Plan   Plans to continue exercise at [facility] (comment)  [facility] - group exercise/ short walking bouts Forensic scientist (comment)  [facility] - group exercise/ short walking bouts Forensic scientist (comment)  [place] - group exercise/ short walking bouts Forensic scientist (comment)  [place] - group exercise/ short walking bouts [facility] (  comment)  [facility] - group exercise/ short walking bouts   Frequency Add 1 additional day to program exercise sessions.  incrementally increase walk distance... only on saturdays Add 1 additional day to program exercise sessions.  incrementally increase walk distance... only on saturdays Add 1 additional day to program exercise sessions.  incrementally increase walk distance... only on saturdays Add 1 additional day to program exercise  sessions.  incrementally increase walk distance... only on saturdays Add 1 additional day to program exercise sessions.  incrementally increase walk distance... only on saturdays      Exercise Comments:      Exercise Comments      04/03/16 1603 04/30/16 1653 05/28/16 1710       Exercise Comments Adjusted pt Ex Rx due to pain and discomfort. pt feels more comfortable on the Nustep. pt did 25 minutes on Nustep without pain or discomfort Reviewed METs and goals. Pt is tolerating exercise well; will continue to monitor exercise progression. Reviewed METs and goals. Pt is tolerating exercise well; will continue to monitor exercise progression.        Discharge Exercise Prescription (Final Exercise Prescription Changes):     Exercise Prescription Changes - 05/28/16 1700    Exercise Review   Progression Yes   Response to Exercise   Blood Pressure (Admit) 140/80 mmHg   Blood Pressure (Exercise) 122/70 mmHg   Blood Pressure (Exit) 130/62 mmHg   Heart Rate (Admit) 70 bpm   Heart Rate (Exercise) 80 bpm   Heart Rate (Exit) 65 bpm   Rating of Perceived Exertion (Exercise) 13   Duration Progress to 30 minutes of continuous aerobic without signs/symptoms of physical distress   Intensity THRR unchanged   Progression   Progression Continue to progress workloads to maintain intensity without signs/symptoms of physical distress.   Average METs 2.3   Resistance Training   Training Prescription Yes   Weight 2lbs   Reps 10-12   NuStep   Level 3   Minutes 30   METs 2.3   Home Exercise Plan   Plans to continue exercise at [facility] (comment)  [facility] - group exercise/ short walking bouts   Frequency Add 1 additional day to program exercise sessions.  incrementally increase walk distance... only on saturdays      Nutrition:  Target Goals: Understanding of nutrition guidelines, daily intake of sodium 1500mg , cholesterol 200mg , calories 30% from fat and 7% or less from  saturated fats, daily to have 5 or more servings of fruits and vegetables.  Biometrics:     Pre Biometrics - 03/19/16 1125    Pre Biometrics   Waist Circumference 40 inches   Hip Circumference 45.25 inches   Waist to Hip Ratio 0.88 %   Triceps Skinfold 19 mm   % Body Fat 31 %   Grip Strength 22 kg   Flexibility 0 in   Single Leg Stand 0 seconds       Nutrition Therapy Plan and Nutrition Goals:     Nutrition Therapy & Goals - 03/20/16 0902    Nutrition Therapy   Diet Renal, Diabetic, Therapeutic Lifestyle Change   Personal Nutrition Goals   Personal Goal #1 0.5-2 lb wt loss/wk to a goal wt loss of 6-15 lb at graduation from [place], educate and counsel regarding individualized specific dietary modifications aiming towards targeted core components such as weight, hypertension, lipid management, diabetes, heart failure and other comorbidities.   Expected Outcomes Short  Term Goal: Understand basic principles of dietary content, such as calories, fat, sodium, cholesterol and nutrients.;Long Term Goal: Adherence to prescribed nutrition plan.      Nutrition Discharge: Nutrition Scores:     Nutrition Assessments - 04/22/16 1453    MEDFICTS Scores   Pre Score 54      Nutrition Goals Re-Evaluation:   Psychosocial: Target Goals: Acknowledge presence or absence of depression, maximize coping skills, provide positive support system. Participant is able to verbalize types and ability to use techniques and skills needed for reducing stress and depression.  Initial Review & Psychosocial Screening:     Initial Psych Review & Screening - 03/19/16 [place]? Yes   Concerns Recent loss of significant other   Comments husband passed away July 16, 2015   Barriers   Psychosocial barriers to participate in program There are no identifiable barriers or psychosocial needs.   Screening Interventions    Interventions Encouraged to exercise      Quality of Life Scores:     Quality of Life - 03/19/16 1143    Quality of Life Scores   Health/Function Pre 17.54 %   Socioeconomic Pre 14.2 %   Psych/Spiritual Pre 13.92 %   Family Pre 20 %   GLOBAL Pre 16.43 %      PHQ-9:     Recent Review Flowsheet Data    Depression screen [facility] 2/9 03/23/2016 02/24/2016 12/11/2015 09/16/2015 09/06/2015   Decreased Interest 0 0 1  0 1    Down, Depressed, Hopeless 2 0 0 0 1   PHQ - 2 Score 2 0 1 0 2   Altered sleeping 1 - - - -   Tired, decreased energy 1 - - - -   Change in appetite 2 - - - -   Feeling bad or failure about yourself  1 - - - -   Trouble concentrating 1 - - - -   Moving slowly or fidgety/restless 1 - - - -   Suicidal thoughts 0 - - - -   PHQ-9 Score 9 - - - -   Difficult doing work/chores Somewhat difficult - - - -      Psychosocial Evaluation and Intervention:     Psychosocial Evaluation - 04/17/16 1721    Psychosocial Evaluation & Interventions   Interventions Stress management education;Relaxation education;Encouraged to exercise with the program and follow exercise prescription   Continued Psychosocial Services Needed Yes      Psychosocial Re-Evaluation:     Psychosocial Re-Evaluation      04/17/16 1721 04/22/16 1711 05/29/16 1519       Psychosocial Re-Evaluation   Interventions Stress management education;Relaxation education;Encouraged to attend Cardiac Rehabilitation for the exercise  Relaxation education;Encouraged to attend Cardiac Rehabilitation for the exercise;Stress management education     Comments  pt feels her legs are stronger and able to participate in more activities at home. pt reports tripping over her dog causing her to fall at home a few weeks ago. pt denies injury. pt states she used skills learned in functional fitness class to get herself up from floor.  pt continues to demonstrate progression in cardiac rehab. pt is tolerating group exercise  program without difficulty. pt attendance is altered by hemodialysis treatments causing fatigue.       Continued Psychosocial Services Needed Yes Yes Yes        Vocational Rehabilitation: Provide vocational rehab assistance to qualifying candidates.   Vocational Rehab Evaluation &  Intervention:     Vocational Rehab - 03/19/16 1504    Initial Vocational Rehab Evaluation & Intervention   Assessment shows need for Vocational Rehabilitation No      Education: Education Goals: Education classes will be provided on a weekly basis, covering required topics. Participant will state understanding/return demonstration of topics presented.  Learning Barriers/Preferences:     Learning Barriers/Preferences - 03/19/16 0953    Learning Barriers/Preferences   Learning Barriers Sight;Hearing  have reading glasses and hearing aid   Learning Preferences Written Material;Skilled Demonstration      Education Topics: Count Your Pulse:  -Group instruction provided by verbal instruction, demonstration, patient participation and written materials to support subject.  Instructors address importance of being able to find your pulse and how to count your pulse when at home without a heart monitor.  Patients get hands on experience counting their pulse with staff help and individually.      CARDIAC REHAB PHASE II EXERCISE from 05/20/2016 in [place]   Date  04/17/16   Instruction Review Code  2- meets goals/outcomes      Heart Attack, Angina, and Risk Factor Modification:  -Group instruction provided by verbal instruction, video, and written materials to support subject.  Instructors address signs and symptoms of angina and heart attacks.    Also discuss risk factors for heart disease and how to make changes to improve heart health risk factors.   Functional Fitness:  -Group instruction provided by verbal instruction, demonstration, patient participation, and written  materials to support subject.  Instructors address safety measures for doing things around the house.  Discuss how to get up and down off the floor, how to pick things up properly, how to safely get out of a chair without assistance, and balance training.      CARDIAC REHAB PHASE II EXERCISE from 05/20/2016 in [facility]   Date  04/03/16   Educator  Seward Carol, [place], ACSM CCES   Instruction Review Code  R- Review/reinforce      Meditation and Mindfulness:  -Group instruction provided by verbal instruction, patient participation, and written materials to support subject.  Instructor addresses importance of mindfulness and meditation practice to help reduce stress and improve awareness.  Instructor also leads participants through a meditation exercise.       CARDIAC REHAB PHASE II EXERCISE from 05/20/2016 in [place]   Date  05/20/16   Instruction Review Code  2- meets goals/outcomes      Stretching for Flexibility and Mobility:  -Group instruction provided by verbal instruction, patient participation, and written materials to support subject.  Instructors lead participants through series of stretches that are designed to increase flexibility thus improving mobility.  These stretches are additional exercise for major muscle groups that are typically performed during regular warm up and cool down.   Hands Only CPR Anytime:  -Group instruction provided by verbal instruction, video, patient participation and written materials to support subject.  Instructors co-teach with AHA video for hands only CPR.  Participants get hands on experience with mannequins.   Nutrition I class: Heart Healthy Eating:  -Group instruction provided by PowerPoint slides, verbal discussion, and written materials to support subject matter. The instructor gives an explanation and review of the Therapeutic Lifestyle Changes diet recommendations, which includes  a discussion on lipid goals, dietary fat, sodium, fiber, plant stanol/sterol esters, sugar, and the components of a well-balanced, healthy diet.  CARDIAC REHAB PHASE II EXERCISE from 05/20/2016 in [place]   Date  04/22/16   Educator  RD   Instruction Review Code  Not applicable [class handout given]      Nutrition II class: Lifestyle Skills:  -Group instruction provided by PowerPoint slides, verbal discussion, and written materials to support subject matter. The instructor gives an explanation and review of label reading, grocery shopping for heart health, heart healthy recipe modifications, and ways to make healthier choices when eating out.      CARDIAC REHAB PHASE II EXERCISE from 05/20/2016 in [place]   Date  04/22/16   Educator  RD   Instruction Review Code  Not applicable [class handouts given]      Diabetes Question & Answer:  -Group instruction provided by PowerPoint slides, verbal discussion, and written materials to support subject matter. The instructor gives an explanation and review of diabetes co-morbidities, pre- and post-prandial blood glucose goals, pre-exercise blood glucose goals, signs, symptoms, and treatment of hypoglycemia and hyperglycemia, and foot care basics.      CARDIAC REHAB PHASE II EXERCISE from 05/20/2016 in [facility]   Date  04/24/16   Educator  RD   Instruction Review Code  2- meets goals/outcomes      Diabetes Blitz:  -Group instruction provided by PowerPoint slides, verbal discussion, and written materials to support subject matter. The instructor gives an explanation and review of the physiology behind type 1 and type 2 diabetes, diabetes medications and rational behind using different medications, pre- and post-prandial blood glucose recommendations and Hemoglobin A1c goals, diabetes diet, and exercise including blood glucose guidelines for exercising  safely.       CARDIAC REHAB PHASE II EXERCISE from 05/20/2016 in [place]   Date  04/22/16   Educator  RD   Instruction Review Code  Not applicable [class handout given]      Portion Distortion:  -Group instruction provided by PowerPoint slides, verbal discussion, written materials, and food models to support subject matter. The instructor gives an explanation of serving size versus portion size, changes in portions sizes over the last 20 years, and what consists of a serving from each food group.      CARDIAC REHAB PHASE II EXERCISE from 05/20/2016 in [place]   Date  05/13/16   Educator  RD   Instruction Review Code  2- meets goals/outcomes      Stress Management:  -Group instruction provided by verbal instruction, video, and written materials to support subject matter.  Instructors review role of stress in heart disease and how to cope with stress positively.        CARDIAC REHAB PHASE II EXERCISE from 05/20/2016 in [place]   Date  04/22/16 [t]   Instruction Review Code  2- meets goals/outcomes      Exercising on Your Own:  -Group instruction provided by verbal instruction, power point, and written materials to support subject.  Instructors discuss benefits of exercise, components of exercise, frequency and intensity of exercise, and end points for exercise.  Also discuss use of nitroglycerin and activating EMS.  Review options of places to exercise outside of rehab.  Review guidelines for sex with heart disease.      CARDIAC REHAB PHASE II EXERCISE from 05/20/2016 in [place]   Date  04/15/16   Educator  Seward Carol, MS,ACSM CCES   Instruction Review Code  2- meets goals/outcomes      Cardiac Drugs I:  -Group instruction provided by verbal instruction and written materials to support subject.  Instructor reviews cardiac drug classes:  antiplatelets, anticoagulants, beta blockers, and statins.  Instructor discusses reasons, side effects, and lifestyle considerations for each drug class.          CARDIAC REHAB PHASE II EXERCISE from 05/20/2016 in [facility]   Date  04/29/16   Educator  pharmD   Instruction Review Code  2- meets goals/outcomes      Cardiac Drugs II:  -Group instruction provided by verbal instruction and written materials to support subject.  Instructor reviews cardiac drug classes: angiotensin converting enzyme inhibitors (ACE-I), angiotensin II receptor blockers (ARBs), nitrates, and calcium channel blockers.  Instructor discusses reasons, side effects, and lifestyle considerations for each drug class.   Anatomy and Physiology of the Circulatory System:  -Group instruction provided by verbal instruction, video, and written materials to support subject.  Reviews functional anatomy of heart, how it relates to various diagnoses, and what role the heart plays in the overall system.   Knowledge Questionnaire Score:     Knowledge Questionnaire Score - 03/19/16 1113    Knowledge Questionnaire Score   Pre Score 19/24      Core Components/Risk Factors/Patient Goals at Admission:     Personal Goals and Risk Factors at Admission - 03/19/16 1141    Core Components/Risk Factors/Patient Goals on Admission    Weight Management Yes;Weight Loss   Intervention Weight Management: Provide education and appropriate resources to help participant work on and attain dietary goals.;Weight Management: Develop a combined nutrition and exercise program designed to reach desired caloric intake, while maintaining appropriate intake of nutrient and fiber, sodium and fats, and appropriate energy expenditure required for the weight goal.   Expected Outcomes Short Term: Continue to assess and modify interventions until short term weight is achieved;Long Term: Adherence to nutrition and physical  activity/exercise program aimed toward attainment of established weight goal;Weight Maintenance: Understanding of the daily nutrition guidelines, which includes 25-35% calories from fat, 7% or less cal from saturated fats, less than 200mg  cholesterol, less than 1.5gm of sodium, & 5 or more servings of fruits and vegetables daily;Weight Loss: Understanding of general recommendations for a balanced deficit meal plan, which promotes 1-2 lb weight loss per week and includes a negative energy balance of [phone] kcal/d;Understanding recommendations for meals to include 15-35% energy as protein, 25-35% energy from fat, 35-60% energy from carbohydrates, less than 200mg  of dietary cholesterol, 20-35 gm of total fiber daily   Increase Strength and Stamina Yes   Hypertension Yes   Intervention Provide education on lifestyle modifcations including regular physical activity/exercise, weight management, moderate sodium restriction and increased consumption of fresh fruit, vegetables, and low fat dairy, alcohol moderation, and smoking cessation.;Monitor prescription use compliance.   Expected Outcomes Short Term: Continued assessment and intervention until BP is < 140/2mm HG in hypertensive participants. < 130/89mm HG in hypertensive participants with diabetes, heart failure or chronic kidney disease.;Long Term: Maintenance of blood pressure at goal levels.   Lipids Yes   Intervention Provide education and support for participant on nutrition & aerobic/resistive exercise along with prescribed medications to achieve LDL 70mg , HDL >40mg .   Expected Outcomes Short Term: Participant states understanding of desired cholesterol values and is compliant with medications prescribed. Participant is following exercise prescription and nutrition guidelines.;Long Term: Cholesterol controlled with medications  as prescribed, with individualized exercise RX and with personalized nutrition plan. Value goals: LDL < 70mg , HDL > 40 mg.    Intervention Offer individual and/or small group education and counseling on adjustment to heart disease, stress management and health-related lifestyle change. Teach and support self-help strategies.;Refer participants experiencing significant psychosocial distress to appropriate mental health specialists for further evaluation and treatment. When possible, include family members and significant others in education/counseling sessions.   Expected Outcomes Short Term: Participant demonstrates changes in health-related behavior, relaxation and other stress management skills, ability to obtain effective social support, and compliance with psychotropic medications if prescribed.;Long Term: Emotional wellbeing is indicated by absence of clinically significant psychosocial distress or social isolation.      Core Components/Risk Factors/Patient Goals Review:      Goals and Risk Factor Review      04/30/16 1654           Core Components/Risk Factors/Patient Goals Review   Personal Goals Review Other;Improve shortness of breath with ADL's       Review pt is able to walk dog with walker for 15 minutes        Expected Outcomes Pt will continue to walk the dog and perform other ADL's with less SOB/ fatigue.          Core Components/Risk Factors/Patient Goals at Discharge (Final Review):      Goals and Risk Factor Review - 04/30/16 1654    Core Components/Risk Factors/Patient Goals Review   Personal Goals Review Other;Improve shortness of breath with ADL's   Review pt is able to walk dog with walker for 15 minutes    Expected Outcomes Pt will continue to walk the dog and perform other ADL's with less SOB/ fatigue.      ITP Comments:     ITP Comments      03/19/16 0951           ITP Comments Dr. Fransico Him, Medical Director          Comments: Pt is making expected progress toward personal goals after completing 20 sessions. Recommend continued exercise and life style modification  education including  stress management and relaxation techniques to decrease cardiac risk profile.

## 2016-06-01 ENCOUNTER — Encounter (HOSPITAL_COMMUNITY)
Admission: RE | Admit: 2016-06-01 | Discharge: 2016-06-01 | Disposition: A | Discharge status: Home / Self Care | Payer: Medicare Other | Source: Ambulatory Visit | Attending: Interventional Cardiology | Admitting: Interventional Cardiology

## 2016-06-01 DIAGNOSIS — Z955 Presence of coronary angioplasty implant and graft: Secondary | ICD-10-CM

## 2016-06-03 ENCOUNTER — Encounter (HOSPITAL_COMMUNITY)
Admission: RE | Admit: 2016-06-03 | Discharge: 2016-06-03 | Disposition: A | Discharge status: Home / Self Care | Payer: Medicare Other | Source: Ambulatory Visit | Attending: Interventional Cardiology | Admitting: Interventional Cardiology

## 2016-06-03 DIAGNOSIS — Z955 Presence of coronary angioplasty implant and graft: Secondary | ICD-10-CM

## 2016-06-05 ENCOUNTER — Encounter (HOSPITAL_COMMUNITY)
Admission: RE | Admit: 2016-06-05 | Discharge: 2016-06-05 | Disposition: A | Discharge status: Home / Self Care | Payer: Medicare Other | Source: Ambulatory Visit | Attending: Interventional Cardiology | Admitting: Interventional Cardiology

## 2016-06-05 ENCOUNTER — Ambulatory Visit (INDEPENDENT_AMBULATORY_CARE_PROVIDER_SITE_OTHER): Payer: Medicare Other | Admitting: Interventional Cardiology

## 2016-06-05 ENCOUNTER — Encounter: Payer: Self-pay | Admitting: Interventional Cardiology

## 2016-06-05 VITALS — BP 152/80 | HR 67 | Ht 62.0 in | Wt 151.4 lb

## 2016-06-05 DIAGNOSIS — I255 Ischemic cardiomyopathy: Secondary | ICD-10-CM | POA: Diagnosis not present

## 2016-06-05 DIAGNOSIS — E785 Hyperlipidemia, unspecified: Secondary | ICD-10-CM | POA: Diagnosis not present

## 2016-06-05 DIAGNOSIS — I2510 Atherosclerotic heart disease of native coronary artery without angina pectoris: Secondary | ICD-10-CM | POA: Diagnosis not present

## 2016-06-05 DIAGNOSIS — I5042 Chronic combined systolic (congestive) and diastolic (congestive) heart failure: Secondary | ICD-10-CM

## 2016-06-05 DIAGNOSIS — Z955 Presence of coronary angioplasty implant and graft: Secondary | ICD-10-CM

## 2016-06-05 DIAGNOSIS — N186 End stage renal disease: Secondary | ICD-10-CM

## 2016-06-05 NOTE — Patient Instructions (Signed)
Medication Instructions:  Your physician recommends that you continue on your current medications as directed. Please refer to the Current Medication list given to you today.   Labwork: None ordered  Testing/Procedures: Your physician has requested that you have an echocardiogram. Echocardiography is a painless test that uses sound waves to create images of your heart. It provides your doctor with information about the size and shape of your heart and how well your heart's chambers and valves are working. This procedure takes approximately one hour. There are no restrictions for this procedure.    Follow-Up: Your physician wants you to follow-up in: 6 months with Dr.Smith You will receive a reminder letter in the mail two months in advance. If you don't receive a letter, please call our office to schedule the follow-up appointment.   Any Other Special Instructions Will Be Listed Below (If Applicable).     If you need a refill on your cardiac medications before your next appointment, please call your pharmacy.   

## 2016-06-05 NOTE — Progress Notes (Signed)
Cardiology Office Note    Date:  06/05/2016   ID:  [name] [name], DOB 04-Nov-1951, MRN [id number]  PCP:  Smiley Houseman, MD  Cardiologist: [name], MD   Chief Complaint  Patient presents with  . Coronary Artery Disease    History of Present Illness:  [name] [name] is a 65 y.o. female who is doing well and here for follow-up of CAD, chronic combined systolic and diastolic heart failure, multiple coronary stents, CABG turndown, end-stage kidney disease, hypertension, and mild dementia.  The patient presented late last year with syncope. Her cardiac enzymes are elevated. Heart catheterization reveals severe three-vessel coronary disease. She was referred for evaluation to have bypass surgery but was turned down. She then underwent CHIP revascularization with LAD DES, circumflex DES, and RCA DES in March 2017. She denies angina. She has occasional right subcostal discomfort that can last up to 15 minutes and occurs randomly. This is dissimilar to any prior chest discomfort episodes.  Past Medical History  Diagnosis Date  . Hearing difficulty     Mild   . Diabetes mellitus ([place])   . Coronary artery disease     a. 6/16 Cath: Severe multivessel dzs->not surg candidate 2/2 debility/poor venous conduits->Med Rx; b. 01/2016 Cath/PCI: LM 50ost, LAD 63m (2.5x32 & 2.5x20 Promus Prem DES'), D1 70ost, D2 90, LCX 95p/m (3.0x16 Promus Prem DES), 65d, RCA 60p->d, RPDA 95 (2.5x12 Promus Prem DES).  . ESRD (end stage renal disease) on dialysis ([place])        . Hypertension   . Ischemic cardiomyopathy     a. Echo 04/2015: EF 35%. Cath same time - 45%.  . Syncope   . Hyponatremia   . Anemia   . Thrombocytopenia ([place])   . Memory loss   . Hyperlipidemia     Past Surgical History  Procedure Laterality Date  . 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 Esophagogastroduodenoscopy Left 02/20/2013    Procedure: ESOPHAGOGASTRODUODENOSCOPY (EGD);  Surgeon: Cleotis Nipper, MD;  Location: [facility];  Service: Endoscopy;  Laterality: Left;  . Av fistula placement Left 03/07/2013    Procedure: ARTERIOVENOUS (AV) FISTULA CREATION;  Surgeon: Elam Dutch, MD;  Location: [facility] OR;  Service: Vascular;  Laterality: Left;  left brachiocephalic AVF  . Cardiac catheterization N/A 04/25/2015    Procedure: Left Heart Cath and Coronary Angiography;  Surgeon: Belva Crome, MD;  Location: [place] CV LAB;  Service: Cardiovascular;  Laterality: N/A;  . Coronary stent placement  02/03/2016    LAD X2     CIRCUMFLEX       RCA   . Cardiac catheterization N/A 02/03/2016    Procedure: Coronary Stent Intervention;  Surgeon: Belva Crome, MD;  Location: [place] CV LAB;  Service: Cardiovascular;  Laterality: N/A;    Current Medications: Outpatient Prescriptions Prior to Visit  Medication Sig Dispense Refill  . acetaminophen (TYLENOL) 500 MG tablet Take 500 mg by mouth every 6 (six) hours as needed for headache.    Marland Kitchen aspirin 81 MG EC tablet Take 1 tablet (81 mg total) by mouth daily. 30 tablet 0  . atorvastatin (LIPITOR) 40 MG tablet Take 1 tablet (40 mg total) by mouth every morning. 30 tablet 2  . calcium carbonate (OS-CAL - DOSED IN MG OF ELEMENTAL CALCIUM) 1250 (500 CA) MG tablet Take two (2) tablets by mouth three (3) times daily with meals and take two (2) tablets by mouth with  snacks.    . chlorpheniramine-HYDROcodone (TUSSIONEX PENNKINETIC ER) 10-8 MG/5ML SUER Take 5 mLs by mouth every 12 (twelve) hours as needed for cough. 115 mL 0  . clopidogrel (PLAVIX) 75 MG tablet Take 75 mg by mouth once. Reported on 03/25/2016  0  . docusate sodium (COLACE) 100 MG capsule Take 100 mg by mouth daily as needed for mild constipation.    . fluticasone (FLONASE) 50 MCG/ACT nasal spray Place 1 spray into both nostrils daily as needed for allergies or rhinitis. Reported on 03/25/2016    . isosorbide mononitrate (ISMO,MONOKET) 10 MG tablet Take 1.5 tablets (15 mg total) by mouth  daily. 30 tablet 0  . midodrine (PROAMATINE) 10 MG tablet Take one (1) tablet (10 mg total) by mouth at bedtime on Mondays, Wednesdays, and Fridays. Take one (1) tablet (10 mg total) by mouth pre-dialysis on Tuesdays, Thursdays, and Saturdays.  3  . multivitamin (RENA-VIT) TABS tablet Take 1 tablet by mouth at bedtime. 90 tablet 3  . nitroGLYCERIN (NITROSTAT) 0.4 MG SL tablet Place 1 tablet (0.4 mg total) under the tongue every 5 (five) minutes as needed for chest pain. 5 tablet 3  . ondansetron (ZOFRAN) 4 MG tablet Take 4 mg by mouth every 6 (six) hours as needed for nausea or vomiting. Reported on 02/24/2016    . pantoprazole (PROTONIX) 40 MG tablet Take 1 tablet (40 mg total) by mouth daily. 30 tablet 0  . promethazine (PHENERGAN) 25 MG tablet Take 1 tablet (25 mg total) by mouth every 6 (six) hours as needed for nausea or vomiting. 30 tablet 0  . ticagrelor (BRILINTA) 90 MG TABS tablet Take 1 tablet (90 mg total) by mouth 2 (two) times daily. 60 tablet 6  . lanthanum (FOSRENOL) 1000 MG chewable tablet Chew 1,000 mg by mouth 3 (three) times daily with meals. Reported on 06/05/2016    . loratadine (CLARITIN) 10 MG tablet Take 10 mg by mouth daily. Reported on 06/05/2016     No facility-administered medications prior to visit.     Allergies:   Aleve; Motrin; Nsaids; Contrast media; Fentanyl; and Tape   Social History   Social History  . Marital Status: Widowed    Spouse Name: N/A  . Number of Children: N/A  . Years of Education: N/A   Social History Main Topics  . Smoking status: Never Smoker   . Smokeless tobacco: Never Used  . Alcohol Use: No  . Drug Use: No  . Sexual Activity: Not Asked   Other Topics Concern  . None   Social History Narrative   Lives with husband Josi Humiston (born 54).    Denies alcohol, tobacco (never smoked), other drugs   Hobbies: reading           Family History:  The patient's family history includes Bone cancer in her father; Heart attack in her  father and sister; Stroke in her brother and sister; Throat cancer in her father.   ROS:   Please see the history of present illness.    She is living in an apartment with her sister. She ambulates independently with a walker. She has muscle aches and pains generally. She denies orthopnea. No lower extremity swelling. States that dialysis sessions are going well.  All other systems reviewed and are negative.   PHYSICAL EXAM:   VS:  BP 152/80 mmHg  Pulse 67  Ht 5\' 2"  (1.575 m)  Wt 151 lb 6.4 oz (68.675 kg)  BMI 27.68 kg/m2   GEN: Well  nourished, well developed, in no acute distress HEENT: normal Neck: no JVD, carotid bruits, or masses Cardiac: RRR; no murmurs, rubs, or gallops,no edema  Respiratory:  clear to auscultation bilaterally, normal work of breathing GI: soft, nontender, nondistended, + BS MS: no deformity or atrophy Skin: warm and dry, no rash Neuro:  Alert and Oriented x 3, Strength and sensation are intact Psych: euthymic mood, full affect  Wt Readings from Last 3 Encounters:  06/05/16 151 lb 6.4 oz (68.675 kg)  03/19/16 160 lb 15 oz (73 kg)  02/24/16 164 lb (74.39 kg)      Studies/Labs Reviewed:   EKG:  EKG  Not repeated on this occasion  Recent Labs: 12/02/2015: ALT 10*; B Natriuretic Peptide >4500.0* 02/04/2016: BUN 27*; Creatinine, Ser 4.93*; Hemoglobin 11.0*; Platelets 166; Potassium 3.8; Sodium 137   Lipid Panel    Component Value Date/Time   CHOL 93* 09/23/2015 0915   TRIG 81 09/23/2015 0915   HDL 34* 09/23/2015 0915   CHOLHDL 2.7 09/23/2015 0915   VLDL 16 09/23/2015 0915   LDLCALC 43 09/23/2015 0915    Additional studies/ records that were reviewed today include:  No new data. Echocardiogram 12/09/15:  Study Conclusions  - Left ventricle: The cavity size was normal. Wall thickness was  increased in a pattern of mild LVH. Systolic function was mildly  to moderately reduced. The estimated ejection fraction was in the  range of 40% to 45%.  Inferoseptal hypokinesis. Doppler parameters  are consistent with abnormal left ventricular relaxation (grade 1  diastolic dysfunction). The E/e&' ratio is between 8-15,  suggesting indeterminate LV filling pressure. - Aortic valve: Trileaflet; mildly calcified leaflets. There was  mild regurgitation. - Mitral valve: Calcified annulus. Mildly thickened leaflets .  There was mild regurgitation. - Left atrium: Moderately dilated at 40 ml/m2. - Right atrium: The atrium was mildly dilated. - Tricuspid valve: There was moderate regurgitation. - Pulmonic valve: There was mild regurgitation. - Pulmonary arteries: Dilated. PA peak pressure: 61 mm Hg (S). - Inferior vena cava: The vessel was dilated. The respirophasic  diameter changes were blunted (< 50%), consistent with elevated  central venous pressure.  Impressions:  - Compared to a prior study in 04/2015, the EF is slightly higher at  40-45% with persistent inferior and inferoseptal wall motion  abnormalities. The RVSP is now higher at 61 mmHg.   ASSESSMENT:    1. CAD in native artery   2. Chronic combined systolic and diastolic heart failure (HCC)   3. Cardiomyopathy, ischemic   4. Hyperlipidemia LDL goal <70   5. End stage renal disease ([place])      PLAN:  In order of problems listed above:  1. Continue the current regimen. Monitor for recurrence of angina. Current right costal/subcostal discomfort is not felt to be ischemic in origin. Continue dual antiplatelet therapy for at least a year and perhaps indefinitely. 2. There are no symptoms of heart failure. Will reassess LV function with improved myocardial perfusion. 3. Note above. 4. Followed by primary care 5. On dialysis.  Plan clinical follow-up in 6 months.  Medication Adjustments/Labs and Tests Ordered: Current medicines are reviewed at length with the patient today.  Concerns regarding medicines are outlined above.  Medication changes, Labs and Tests  ordered today are listed in the Patient Instructions below. Patient Instructions  Medication Instructions:  Your physician recommends that you continue on your current medications as directed. Please refer to the Current Medication list given to you today.   Labwork: None  ordered  Testing/Procedures: Your physician has requested that you have an echocardiogram. Echocardiography is a painless test that uses sound waves to create images of your heart. It provides your doctor with information about the size and shape of your heart and how well your heart's chambers and valves are working. This procedure takes approximately one hour. There are no restrictions for this procedure.    Follow-Up: Your physician wants you to follow-up in: 6 months with Dr.Smith You will receive a reminder letter in the mail two months in advance. If you don't receive a letter, please call our office to schedule the follow-up appointment.   Any Other Special Instructions Will Be Listed Below (If Applicable).     If you need a refill on your cardiac medications before your next appointment, please call your pharmacy.       Signed, [name], MD  06/05/2016 12:06 PM    [address], [place],   [address] Phone: [phone]; Fax: [phone]

## 2016-06-05 NOTE — Progress Notes (Signed)
[name] [name]'s weight is up 1.7 kg from Wednesday. [name] [name] reports feeling a little short of breath with exertion. Oxygen saturation 90-92% on room air while exercising on the nustep. Upon assessment lung fields with diminished breath sounds in the left base. Trace lower extremity edema noted greater on the right. When I had [name] [name] to take a deep breath her oxygen saturations went up to 98%. I called [name] [name] PAC to notify. No new order received. [name] [name] is going to dialysis tomorrow. [name] [name] told me they did not take off as much fluid at dialysis yesterday as they normally do.  [name] [name] completed exercise without difficulty. Will continue to monitor the patient throughout  the program.[name] [name] [name] Maxon, RN,BSN 06/05/2016 4:44 PM

## 2016-06-08 ENCOUNTER — Encounter (HOSPITAL_COMMUNITY)
Admission: RE | Admit: 2016-06-08 | Discharge: 2016-06-08 | Disposition: A | Discharge status: Home / Self Care | Payer: Medicare Other | Source: Ambulatory Visit | Attending: Interventional Cardiology | Admitting: Interventional Cardiology

## 2016-06-08 DIAGNOSIS — Z955 Presence of coronary angioplasty implant and graft: Secondary | ICD-10-CM

## 2016-06-10 ENCOUNTER — Encounter (HOSPITAL_COMMUNITY)
Admission: RE | Admit: 2016-06-10 | Discharge: 2016-06-10 | Disposition: A | Discharge status: Home / Self Care | Payer: Medicare Other | Source: Ambulatory Visit | Attending: Interventional Cardiology | Admitting: Interventional Cardiology

## 2016-06-10 DIAGNOSIS — Z955 Presence of coronary angioplasty implant and graft: Secondary | ICD-10-CM

## 2016-06-10 NOTE — Progress Notes (Signed)
Daily Session Note  Patient Details  Name: CARMINE YOUNGBERG MRN: [id number] Date of Birth: 1951/01/16 Referring Provider:   Flowsheet Row CARDIAC REHAB PHASE II ORIENTATION from 03/19/2016 in [place]  Referring Provider  Smith,Henry MD      Encounter Date: 06/10/2016  Check In:     Session Check In - 06/10/16 1429      Check-In   Location [facility]   Staff Present Andi Hence, RN, BSN;Amber Fair, MS, ACSM RCEP, Exercise Physiologist;Maria Whitaker, RN, BSN   Supervising physician immediately available to respond to emergencies [facility] immediately available   Physician(s) Dr. Marily Memos   Medication changes reported     No   Fall or balance concerns reported    No   Warm-up and Cool-down Performed as group-led instruction   Resistance Training Performed No   VAD Patient? No     Pain Assessment   Currently in Pain? No/denies      Capillary Blood Glucose: No results found for this or any previous visit (from the past 24 hour(s)).   Goals Met:  Exercise tolerated well  Goals Unmet:  Not Applicable  Comments: pt had episode of stool incontinence. Pt does wear depends for loss of bowel control. Pt does express embarrassment of this incident. Pt reports this is a chronic problem.  Pt tolerated exercise otherwise without difficulty.      Dr. Fransico Him is Medical Director for Cardiac Rehab at [facility].

## 2016-06-12 ENCOUNTER — Encounter (HOSPITAL_COMMUNITY)
Admission: RE | Admit: 2016-06-12 | Discharge: 2016-06-12 | Disposition: A | Discharge status: Home / Self Care | Payer: Medicare Other | Source: Ambulatory Visit | Attending: Interventional Cardiology | Admitting: Interventional Cardiology

## 2016-06-12 DIAGNOSIS — Z955 Presence of coronary angioplasty implant and graft: Secondary | ICD-10-CM

## 2016-06-15 ENCOUNTER — Encounter (HOSPITAL_COMMUNITY): Payer: Medicare Other

## 2016-06-17 ENCOUNTER — Encounter (HOSPITAL_COMMUNITY)
Admission: RE | Admit: 2016-06-17 | Discharge: 2016-06-17 | Disposition: A | Discharge status: Home / Self Care | Payer: Medicare Other | Source: Ambulatory Visit | Attending: Interventional Cardiology | Admitting: Interventional Cardiology

## 2016-06-17 DIAGNOSIS — E871 Hypo-osmolality and hyponatremia: Secondary | ICD-10-CM | POA: Diagnosis not present

## 2016-06-17 DIAGNOSIS — I2510 Atherosclerotic heart disease of native coronary artery without angina pectoris: Secondary | ICD-10-CM | POA: Diagnosis not present

## 2016-06-17 DIAGNOSIS — I1311 Hypertensive heart and chronic kidney disease without heart failure, with stage 5 chronic kidney disease, or end stage renal disease: Secondary | ICD-10-CM | POA: Insufficient documentation

## 2016-06-17 DIAGNOSIS — E785 Hyperlipidemia, unspecified: Secondary | ICD-10-CM | POA: Diagnosis not present

## 2016-06-17 DIAGNOSIS — Z79899 Other long term (current) drug therapy: Secondary | ICD-10-CM | POA: Insufficient documentation

## 2016-06-17 DIAGNOSIS — E1122 Type 2 diabetes mellitus with diabetic chronic kidney disease: Secondary | ICD-10-CM | POA: Diagnosis not present

## 2016-06-17 DIAGNOSIS — Z955 Presence of coronary angioplasty implant and graft: Secondary | ICD-10-CM | POA: Insufficient documentation

## 2016-06-17 DIAGNOSIS — Z7982 Long term (current) use of aspirin: Secondary | ICD-10-CM | POA: Diagnosis not present

## 2016-06-17 DIAGNOSIS — N186 End stage renal disease: Secondary | ICD-10-CM | POA: Diagnosis not present

## 2016-06-19 ENCOUNTER — Other Ambulatory Visit: Payer: Self-pay

## 2016-06-19 ENCOUNTER — Telehealth: Payer: Self-pay

## 2016-06-19 ENCOUNTER — Encounter (HOSPITAL_COMMUNITY): Payer: Medicare Other

## 2016-06-19 ENCOUNTER — Ambulatory Visit (HOSPITAL_COMMUNITY): Payer: Medicare Other | Attending: Cardiology

## 2016-06-19 DIAGNOSIS — I351 Nonrheumatic aortic (valve) insufficiency: Secondary | ICD-10-CM | POA: Insufficient documentation

## 2016-06-19 DIAGNOSIS — I071 Rheumatic tricuspid insufficiency: Secondary | ICD-10-CM | POA: Diagnosis not present

## 2016-06-19 DIAGNOSIS — I2510 Atherosclerotic heart disease of native coronary artery without angina pectoris: Secondary | ICD-10-CM | POA: Diagnosis not present

## 2016-06-19 DIAGNOSIS — I5042 Chronic combined systolic (congestive) and diastolic (congestive) heart failure: Secondary | ICD-10-CM

## 2016-06-19 DIAGNOSIS — J90 Pleural effusion, not elsewhere classified: Secondary | ICD-10-CM

## 2016-06-19 DIAGNOSIS — D649 Anemia, unspecified: Secondary | ICD-10-CM | POA: Insufficient documentation

## 2016-06-19 DIAGNOSIS — I959 Hypotension, unspecified: Secondary | ICD-10-CM | POA: Diagnosis not present

## 2016-06-19 DIAGNOSIS — I509 Heart failure, unspecified: Secondary | ICD-10-CM | POA: Diagnosis present

## 2016-06-19 DIAGNOSIS — E1122 Type 2 diabetes mellitus with diabetic chronic kidney disease: Secondary | ICD-10-CM | POA: Diagnosis not present

## 2016-06-19 DIAGNOSIS — I517 Cardiomegaly: Secondary | ICD-10-CM | POA: Insufficient documentation

## 2016-06-19 DIAGNOSIS — I255 Ischemic cardiomyopathy: Secondary | ICD-10-CM | POA: Diagnosis not present

## 2016-06-19 DIAGNOSIS — I059 Rheumatic mitral valve disease, unspecified: Secondary | ICD-10-CM | POA: Diagnosis not present

## 2016-06-19 DIAGNOSIS — N186 End stage renal disease: Secondary | ICD-10-CM | POA: Diagnosis not present

## 2016-06-19 DIAGNOSIS — E785 Hyperlipidemia, unspecified: Secondary | ICD-10-CM | POA: Diagnosis not present

## 2016-06-19 NOTE — Telephone Encounter (Signed)
-----   Message from Belva Crome, MD sent at 06/19/2016  3:16 PM EDT ----- Let the patient know Heart function is stable in the low normal range. There is severe pulmonary hypertension. Large amount of fluid around the left lung. Needs PA and lateral CXR for large left pleural effusion on CXR. A copy will be sent to Smiley Houseman, MD

## 2016-06-19 NOTE — Telephone Encounter (Signed)
Pt aware of echo results and Dr.Smith's recommendation. Let the patient know Heart function is stable in the low normal range. There is severe pulmonary hypertension. Large amount of fluid around the left lung. Needs PA and lateral CXR for large left pleural effusion on CXR  Cxr ordered pt will go to [facility] 8/7 to have it performed.  Pt sts that her systolic BP dropped to the 60's this week during dialysis and she was administered saline solution. Pt sts that she is holding on to excess fluid. She denies sob, adv her to contact our on call service over the weekend if needed. Her next dialysis day is tomorrow.

## 2016-06-22 ENCOUNTER — Encounter (HOSPITAL_COMMUNITY)
Admission: RE | Admit: 2016-06-22 | Discharge: 2016-06-22 | Disposition: A | Discharge status: Home / Self Care | Payer: Medicare Other | Source: Ambulatory Visit | Attending: Interventional Cardiology | Admitting: Interventional Cardiology

## 2016-06-22 DIAGNOSIS — Z955 Presence of coronary angioplasty implant and graft: Secondary | ICD-10-CM | POA: Diagnosis not present

## 2016-06-24 ENCOUNTER — Encounter (HOSPITAL_COMMUNITY)
Admission: RE | Admit: 2016-06-24 | Discharge: 2016-06-24 | Disposition: A | Discharge status: Home / Self Care | Payer: Medicare Other | Source: Ambulatory Visit | Attending: Interventional Cardiology | Admitting: Interventional Cardiology

## 2016-06-24 ENCOUNTER — Ambulatory Visit
Admission: RE | Admit: 2016-06-24 | Discharge: 2016-06-24 | Disposition: A | Discharge status: Home / Self Care | Payer: Medicare Other | Source: Ambulatory Visit | Attending: Interventional Cardiology | Admitting: Interventional Cardiology

## 2016-06-24 ENCOUNTER — Other Ambulatory Visit: Payer: Self-pay

## 2016-06-24 DIAGNOSIS — Z955 Presence of coronary angioplasty implant and graft: Secondary | ICD-10-CM | POA: Diagnosis not present

## 2016-06-24 DIAGNOSIS — J90 Pleural effusion, not elsewhere classified: Secondary | ICD-10-CM

## 2016-06-24 NOTE — Telephone Encounter (Signed)
-----   Message from Belva Crome, MD sent at 06/24/2016  5:39 PM EDT ----- Let the patient know she has a large pleural effusion. She needs to have this tapped and serology/culture/cytology done. Please arrange therapeutic and diagnostic thoracentesis with interventional radiology. A copy will be sent to Smiley Houseman, MD; Darrold Span. Florene Glen, MD

## 2016-06-24 NOTE — Telephone Encounter (Signed)
Called to give pt cxr results and Dr.Smith's recommendation. Detailed message left on patients voicemail.pt is to go to the ED if symptoms develop. She can call  our on call provider if any concerns this evening , or call the office tomorrow and speak with a nurse.  Per Dr.Smith Let the patient know she has a large pleural effusion. She needs to have this tapped and serology/culture/cytology done. Please arrange therapeutic and diagnostic thoracentesis with interventional radiology.  A copy will be sent to [name] Houseman, MD; [name] [name]. [name] Glen, MD  Pt will be called with an appt  Ref is in epic and message fwd to [facility] to call to get pt an appt with interventional radiology asap. Ref sheet left up front at [facility].  Spoke with patients sister DPR she is aware of results and Dr.Smith's recommendation above she will communicate the message to the pt

## 2016-06-25 ENCOUNTER — Other Ambulatory Visit: Payer: Self-pay | Admitting: Interventional Cardiology

## 2016-06-25 DIAGNOSIS — J90 Pleural effusion, not elsewhere classified: Secondary | ICD-10-CM

## 2016-06-26 ENCOUNTER — Telehealth: Payer: Self-pay | Admitting: Interventional Cardiology

## 2016-06-26 ENCOUNTER — Encounter (HOSPITAL_COMMUNITY)
Admission: RE | Admit: 2016-06-26 | Discharge: 2016-06-26 | Disposition: A | Discharge status: Home / Self Care | Payer: Medicare Other | Source: Ambulatory Visit | Attending: Interventional Cardiology | Admitting: Interventional Cardiology

## 2016-06-26 DIAGNOSIS — Z955 Presence of coronary angioplasty implant and graft: Secondary | ICD-10-CM

## 2016-06-26 NOTE — Telephone Encounter (Signed)
Returned call to [facility] @ central radiology scheduling. Lmom. Per Dr.Smith patient has a large pleural effusion. She needs to have this tapped and serology/culture/cytology done. Please arrange therapeutic and diagnostic thoracentesis.  Darl Householder is to call back if any questions

## 2016-06-26 NOTE — Progress Notes (Signed)
Pt arrived at cardiac rehab with resting BP:  160/98.  Pt asymptomatic, although reports episode of nausea and vomiting last night post dialysis. Pt also reports she woke up late today, therefore took her AM meds several hours later than usual. Upon chart review, pt is has large left pleural effusion.  Pt scheduled for thoracentesis 07/03/16.  Pt advised not appropriate to exercise today with hypertension and best to withhold exercise until after thoracentesis.  Will request Dr. Thompson Caul permission for pt to return post procedure.  Pt verbalized understanding.

## 2016-06-26 NOTE — Telephone Encounter (Signed)
Per [facility] scheduler.     Would like a call back today please.  On this pt do you need Thoracentesis??  This is diagnostic or therapeutic??    It has been scheduled for 8/18 10am

## 2016-07-03 ENCOUNTER — Other Ambulatory Visit: Payer: Self-pay | Admitting: *Deleted

## 2016-07-03 ENCOUNTER — Ambulatory Visit (HOSPITAL_COMMUNITY)
Admission: RE | Admit: 2016-07-03 | Discharge: 2016-07-03 | Disposition: A | Discharge status: Home / Self Care | Payer: Medicare Other | Source: Ambulatory Visit | Attending: Interventional Cardiology | Admitting: Interventional Cardiology

## 2016-07-03 ENCOUNTER — Ambulatory Visit (HOSPITAL_COMMUNITY)
Admission: RE | Admit: 2016-07-03 | Discharge: 2016-07-03 | Disposition: A | Discharge status: Home / Self Care | Payer: Medicare Other | Source: Ambulatory Visit | Attending: Radiology | Admitting: Radiology

## 2016-07-03 DIAGNOSIS — Z9889 Other specified postprocedural states: Secondary | ICD-10-CM | POA: Insufficient documentation

## 2016-07-03 DIAGNOSIS — J948 Other specified pleural conditions: Secondary | ICD-10-CM | POA: Insufficient documentation

## 2016-07-03 DIAGNOSIS — J90 Pleural effusion, not elsewhere classified: Secondary | ICD-10-CM

## 2016-07-03 LAB — GLUCOSE, SEROUS FLUID: Glucose, Fluid: 143 mg/dL

## 2016-07-03 LAB — PROTEIN, BODY FLUID: Total protein, fluid: <3 g/dL

## 2016-07-03 LAB — BODY FLUID CELL COUNT WITH DIFFERENTIAL
Eos, Fluid: 1 %
Lymphs, Fluid: 2 %
Monocyte-Macrophage-Serous Fluid: 92 % — ABNORMAL HIGH (ref 50–90)
NEUTROPHIL FLUID: 5 % (ref 0–25)
WBC FLUID: 152 uL (ref 0–1000)

## 2016-07-03 LAB — GRAM STAIN

## 2016-07-03 LAB — LACTATE DEHYDROGENASE, PLEURAL OR PERITONEAL FLUID: LD, Fluid: 102 U/L — ABNORMAL HIGH (ref 3–23)

## 2016-07-03 LAB — AMYLASE, PLEURAL FLUID: AMYLASE, PLEURAL FLUID: 16 U/L

## 2016-07-03 NOTE — Procedures (Signed)
Successful US guided left thoracentesis. Yielded 1.5L of slightly hazy, dark yellow fluid. Pt tolerated procedure well. No immediate complications.  Specimen was sent for labs. CXR ordered.  Ascencion Dike PA-C 07/03/2016 10:36 AM

## 2016-07-03 NOTE — Progress Notes (Signed)
Cardiac Individual Treatment Plan  Patient Details  Name: [name] [name] MRN: [id number] Date of Birth: 02/09/1951 Referring Provider:   Flowsheet Row CARDIAC REHAB PHASE II ORIENTATION from 03/19/2016 in [facility]  Referring Provider  Smith,Henry MD      Initial Encounter Date:  [place] PHASE II ORIENTATION from 03/19/2016 in [facility]  Date  03/19/16  Referring Provider  Smith,Henry MD      Visit Diagnosis: 02/03/16 Stented coronary artery  Patient's Home Medications on Admission:  Current Outpatient Prescriptions:  .  acetaminophen (TYLENOL) 500 MG tablet, Take 500 mg by mouth every 6 (six) hours as needed for headache., Disp: , Rfl:  .  aspirin 81 MG EC tablet, Take 1 tablet (81 mg total) by mouth daily., Disp: 30 tablet, Rfl: 0 .  atorvastatin (LIPITOR) 40 MG tablet, Take 1 tablet (40 mg total) by mouth every morning., Disp: 30 tablet, Rfl: 2 .  calcium carbonate (OS-CAL - DOSED IN MG OF ELEMENTAL CALCIUM) 1250 (500 CA) MG tablet, Take two (2) tablets by mouth three (3) times daily with meals and take two (2) tablets by mouth with snacks., Disp: , Rfl:  .  chlorpheniramine-HYDROcodone (TUSSIONEX PENNKINETIC ER) 10-8 MG/5ML SUER, Take 5 mLs by mouth every 12 (twelve) hours as needed for cough., Disp: 115 mL, Rfl: 0 .  clopidogrel (PLAVIX) 75 MG tablet, Take 75 mg by mouth once. Reported on 03/25/2016, Disp: , Rfl: 0 .  docusate sodium (COLACE) 100 MG capsule, Take 100 mg by mouth daily as needed for mild constipation., Disp: , Rfl:  .  fluticasone (FLONASE) 50 MCG/ACT nasal spray, Place 1 spray into both nostrils daily as needed for allergies or rhinitis. Reported on 03/25/2016, Disp: , Rfl:  .  isosorbide mononitrate (ISMO,MONOKET) 10 MG tablet, Take 1.5 tablets (15 mg total) by mouth daily., Disp: 30 tablet, Rfl: 0 .  midodrine (PROAMATINE) 10 MG tablet, Take one (1) tablet (10 mg total) by mouth at  bedtime on Mondays, Wednesdays, and Fridays. Take one (1) tablet (10 mg total) by mouth pre-dialysis on Tuesdays, Thursdays, and Saturdays., Disp: , Rfl: 3 .  multivitamin (RENA-VIT) TABS tablet, Take 1 tablet by mouth at bedtime., Disp: 90 tablet, Rfl: 3 .  nitroGLYCERIN (NITROSTAT) 0.4 MG SL tablet, Place 1 tablet (0.4 mg total) under the tongue every 5 (five) minutes as needed for chest pain., Disp: 5 tablet, Rfl: 3 .  ondansetron (ZOFRAN) 4 MG tablet, Take 4 mg by mouth every 6 (six) hours as needed for nausea or vomiting. Reported on 02/24/2016, Disp: , Rfl:  .  pantoprazole (PROTONIX) 40 MG tablet, Take 1 tablet (40 mg total) by mouth daily., Disp: 30 tablet, Rfl: 0 .  promethazine (PHENERGAN) 25 MG tablet, Take 1 tablet (25 mg total) by mouth every 6 (six) hours as needed for nausea or vomiting., Disp: 30 tablet, Rfl: 0 .  sevelamer carbonate (RENVELA) 2.4 g PACK, Take two (2) packets by mouth with each meal. Twice daily on dialysis days and Three times daily on non-dialysis days., Disp: , Rfl: 0 .  ticagrelor (BRILINTA) 90 MG TABS tablet, Take 1 tablet (90 mg total) by mouth 2 (two) times daily., Disp: 60 tablet, Rfl: 6  Past Medical History: Past Medical History:  Diagnosis Date  . Anemia   . Coronary artery disease    a. 6/16 Cath: Severe multivessel dzs->not surg candidate 2/2 debility/poor venous conduits->Med Rx; b. 01/2016 Cath/PCI: LM 50ost,  LAD 53m (2.5x32 & 2.5x20 Promus Prem DES'), D1 70ost, D2 90, LCX 95p/m (3.0x16 Promus Prem DES), 65d, RCA 60p->d, RPDA 95 (2.5x12 Promus Prem DES).  . Diabetes mellitus ([place])   . ESRD (end stage renal disease) on dialysis ([place])       . Hearing difficulty    Mild   . Hyperlipidemia   . Hypertension   . Hyponatremia   . Ischemic cardiomyopathy    a. Echo 04/2015: EF 35%. Cath same time - 45%.  . Memory loss   . Syncope   . Thrombocytopenia ([facility])     Tobacco Use: History  Smoking Status  . Never Smoker  Smokeless Tobacco  . Never Used     Labs: Recent Review Flowsheet Data    Labs for ITP Cardiac and Pulmonary Rehab Latest Ref Rng & Units 06/12/2014 04/25/2015 08/06/2015 09/23/2015 02/24/2016   Cholestrol 125 - 200 mg/dL 191 - - 93(L) -   LDLCALC <130 mg/dL 117(H) - - 43 -   HDL >=46 mg/dL 51 - - 34(L) -   Trlycerides <150 mg/dL 115 - - 81 -   Hemoglobin A1c - 6.1 - 5.4 - 5.2   PHART 7.350 - 7.450 - 7.413 - - -   PCO2ART 35.0 - 45.0 mmHg - 37.9 - - -   HCO3 20.0 - 24.0 mEq/L - 23.9 - - -   TCO2 0 - 100 mmol/L - 25.1 - - -   ACIDBASEDEF 0.0 - 2.0 mmol/L - 0.2 - - -   O2SAT % - 86.7 - - -      Capillary Blood Glucose: Lab Results  Component Value Date   GLUCAP 101 (H) 02/04/2016   GLUCAP 152 (H) 02/04/2016   GLUCAP 211 (H) 02/03/2016   GLUCAP 178 (H) 02/03/2016   GLUCAP 86 02/03/2016     Exercise Target Goals:    Exercise Program Goal: Individual exercise prescription set with THRR, safety & activity barriers. Participant demonstrates ability to understand and report RPE using BORG scale, to self-measure pulse accurately, and to acknowledge the importance of the exercise prescription.  Exercise Prescription Goal: Starting with aerobic activity 30 plus minutes a day, 3 days per week for initial exercise prescription. Provide home exercise prescription and guidelines that participant acknowledges understanding prior to discharge.  Activity Barriers & Risk Stratification:     Activity Barriers & Cardiac Risk Stratification - 03/19/16 0952      Activity Barriers & Cardiac Risk Stratification   Activity Barriers Back Problems;Joint Problems;Muscular Weakness;Assistive Device   Cardiac Risk Stratification High      6 Minute Walk:     6 Minute Walk    Row Name 03/19/16 1117         6 Minute Walk   Phase Initial     Distance 512 feet     Walk Time 4 minutes     # of Rest Breaks 1  2 min     MPH 1.5     METS 1.5     RPE 13     VO2 Peak 5.14     Symptoms Yes (comment)     Comments fatigue      Resting HR 70 bpm     Resting BP 106/64     Max Ex. HR 81 bpm     Max Ex. BP 148/84     2 Minute Post BP 126/80        Initial Exercise Prescription:     Initial Exercise Prescription -  03/19/16 1100      Date of Initial Exercise RX and Referring Provider   Date 03/19/16   Referring Provider Smith,Henry MD     Recumbant Bike   Level 1   Minutes 10   METs 1.2     NuStep   Level 1   Minutes 10   METs 1.2     Track   Laps 5   Minutes 10   METs 1.2     Intensity   THRR 40-80% of Max Heartrate 62-123   Ratings of Perceived Exertion 11-13   Perceived Dyspnea 0-4     Progression   Progression Continue to progress workloads to maintain intensity without signs/symptoms of physical distress.     Resistance Training   Training Prescription Yes   Weight 1lb   Reps 10-12      Perform Capillary Blood Glucose checks as needed.  Exercise Prescription Changes:     Exercise Prescription Changes    Row Name 04/03/16 1500 04/20/16 1700 04/30/16 1600 05/18/16 1500 05/28/16 1700     Exercise Review   Progression Yes Yes Yes Yes Yes     Response to Exercise   Blood Pressure (Admit) 116/62 120/60 110/60 122/72 140/80   Blood Pressure (Exercise) 120/70 114/60 122/72 128/80 122/70   Blood Pressure (Exit) 120/70 140/70 122/70 116/74 130/62   Heart Rate (Admit) 67 bpm 67 bpm 66 bpm 66 bpm 70 bpm   Heart Rate (Exercise) 88 bpm 81 bpm 71 bpm 83 bpm 80 bpm   Heart Rate (Exit) 62 bpm 67 bpm 71 bpm 64 bpm 65 bpm   Rating of Perceived Exertion (Exercise) 11 9 9 11 13    Duration Progress to 30 minutes of continuous aerobic without signs/symptoms of physical distress Progress to 30 minutes of continuous aerobic without signs/symptoms of physical distress Progress to 30 minutes of continuous aerobic without signs/symptoms of physical distress Progress to 30 minutes of continuous aerobic without signs/symptoms of physical distress Progress to 30 minutes of continuous aerobic without  signs/symptoms of physical distress   Intensity THRR unchanged THRR unchanged THRR unchanged THRR unchanged THRR unchanged     Progression   Progression Continue to progress workloads to maintain intensity without signs/symptoms of physical distress. Continue to progress workloads to maintain intensity without signs/symptoms of physical distress. Continue to progress workloads to maintain intensity without signs/symptoms of physical distress. Continue to progress workloads to maintain intensity without signs/symptoms of physical distress. Continue to progress workloads to maintain intensity without signs/symptoms of physical distress.   Average METs 1.8 1.9 1.9 2.2 2.3     Resistance Training   Training Prescription Yes Yes Yes Yes Yes   Weight 1lb 2llbs 2lbs 2lbs 2lbs   Reps 10-12 10-12 10-12 10-12 10-12     Recumbant Bike   METs  -  - 2  -  -     NuStep   Level 1 1 1 3 3    Minutes 25 30 30 30 30    METs 1.8 1.9 1.9 2.2 2.3     Home Exercise Plan   Plans to continue exercise at [facility] (comment)  [place] - group exercise/ short walking bouts Forensic scientist (comment)  [place] - group exercise/ short walking bouts Forensic scientist (comment)  [place] - group exercise/ short walking bouts Forensic scientist (comment)  [place] - group exercise/ short walking bouts Forensic scientist (comment)  [place] - group exercise/ short walking bouts   Frequency Add 1 additional day  to program exercise sessions.  incrementally increase walk distance... only on saturdays Add 1 additional day to program exercise sessions.  incrementally increase walk distance... only on saturdays Add 1 additional day to program exercise sessions.  incrementally increase walk distance... only on saturdays Add 1 additional day to program exercise sessions.  incrementally increase walk distance... only on saturdays Add 1 additional day to program exercise sessions.   incrementally increase walk distance... only on [place] Name 06/12/16 1500 06/26/16 1500           Exercise Review   Progression No No        Response to Exercise   Blood Pressure (Admit) 128/60 104/62      Blood Pressure (Exercise) 104/70 120/70      Blood Pressure (Exit) 120/80 132/62      Heart Rate (Admit) 61 bpm 70 bpm      Heart Rate (Exercise) 73 bpm 88 bpm      Heart Rate (Exit) 59 bpm 68 bpm      Rating of Perceived Exertion (Exercise) 12 12      Duration Progress to 30 minutes of continuous aerobic without signs/symptoms of physical distress Progress to 30 minutes of continuous aerobic without signs/symptoms of physical distress      Intensity THRR unchanged THRR unchanged        Progression   Progression Continue to progress workloads to maintain intensity without signs/symptoms of physical distress. Continue to progress workloads to maintain intensity without signs/symptoms of physical distress.      Average METs 2 2        Resistance Training   Training Prescription Yes Yes      Weight 2lbs 2lbs      Reps 10-12 10-12        NuStep   Level 3 3      Minutes 30 30      METs 2 2        Home Exercise Plan   Plans to continue exercise at [facility] (comment)  [facility] - group exercise/ short walking bouts Forensic scientist (comment)  [place] - group exercise/ short walking bouts      Frequency Add 1 additional day to program exercise sessions.  incrementally increase walk distance... only on saturdays Add 1 additional day to program exercise sessions.  incrementally increase walk distance... only on saturdays         Exercise Comments:     Exercise Comments    Row Name 04/03/16 1603 04/30/16 1653 05/28/16 1710 06/26/16 1526     Exercise Comments Adjusted pt Ex Rx due to pain and discomfort. pt feels more comfortable on the Nustep. pt did 25 minutes on Nustep without pain or discomfort Reviewed METs and goals. Pt is tolerating  exercise well; will continue to monitor exercise progression. Reviewed METs and goals. Pt is tolerating exercise well; will continue to monitor exercise progression. Reviewed METs and goals. Pt is tolerating exercise well; will continue to monitor exercise progression.       Discharge Exercise Prescription (Final Exercise Prescription Changes):     Exercise Prescription Changes - 06/26/16 1500      Exercise Review   Progression No     Response to Exercise   Blood Pressure (Admit) 104/62   Blood Pressure (Exercise) 120/70   Blood Pressure (Exit) 132/62   Heart Rate (Admit) 70 bpm   Heart Rate (Exercise) 88 bpm   Heart Rate (Exit) 68 bpm   Rating  of Perceived Exertion (Exercise) 12   Duration Progress to 30 minutes of continuous aerobic without signs/symptoms of physical distress   Intensity THRR unchanged     Progression   Progression Continue to progress workloads to maintain intensity without signs/symptoms of physical distress.   Average METs 2     Resistance Training   Training Prescription Yes   Weight 2lbs   Reps 10-12     NuStep   Level 3   Minutes 30   METs 2     Home Exercise Plan   Plans to continue exercise at [facility] (comment)  [facility] - group exercise/ short walking bouts   Frequency Add 1 additional day to program exercise sessions.  incrementally increase walk distance... only on saturdays      Nutrition:  Target Goals: Understanding of nutrition guidelines, daily intake of sodium 1500mg , cholesterol 200mg , calories 30% from fat and 7% or less from saturated fats, daily to have 5 or more servings of fruits and vegetables.  Biometrics:     Pre Biometrics - 03/19/16 1125      Pre Biometrics   Waist Circumference 40 inches   Hip Circumference 45.25 inches   Waist to Hip Ratio 0.88 %   Triceps Skinfold 19 mm   % Body Fat 31 %   Grip Strength 22 kg   Flexibility 0 in   Single Leg Stand 0 seconds       Nutrition Therapy  Plan and Nutrition Goals:     Nutrition Therapy & Goals - 03/20/16 0902      Nutrition Therapy   Diet Renal, Diabetic, Therapeutic Lifestyle Change     Personal Nutrition Goals   Personal Goal #1 0.5-2 lb wt loss/wk to a goal wt loss of 6-15 lb at graduation from [place], educate and counsel regarding individualized specific dietary modifications aiming towards targeted core components such as weight, hypertension, lipid management, diabetes, heart failure and other comorbidities.   Expected Outcomes Short Term Goal: Understand basic principles of dietary content, such as calories, fat, sodium, cholesterol and nutrients.;Long Term Goal: Adherence to prescribed nutrition plan.      Nutrition Discharge: Nutrition Scores:     Nutrition Assessments - 04/22/16 1453      MEDFICTS Scores   Pre Score 54      Nutrition Goals Re-Evaluation:   Psychosocial: Target Goals: Acknowledge presence or absence of depression, maximize coping skills, provide positive support system. Participant is able to verbalize types and ability to use techniques and skills needed for reducing stress and depression.  Initial Review & Psychosocial Screening:     Initial Psych Review & Screening - 03/19/16 [place]? Yes   Concerns Recent loss of significant other   Comments husband passed away 2015/07/14     Barriers   Psychosocial barriers to participate in program There are no identifiable barriers or psychosocial needs.     Screening Interventions   Interventions Encouraged to exercise      Quality of Life Scores:     Quality of Life - 03/19/16 1143      Quality of Life Scores   Health/Function Pre 17.54 %   Socioeconomic Pre 14.2 %   Psych/Spiritual Pre 13.92 %   Family Pre 20 %   GLOBAL Pre 16.43 %      PHQ-9: Recent Review Flowsheet Data    Depression screen  [facility] 2/9 03/23/2016 02/24/2016  12/11/2015 09/16/2015 09/06/2015   Decreased Interest 0 0 1  0 1    Down, Depressed, Hopeless 2 0 0 0 1   PHQ - 2 Score 2 0 1 0 2   Altered sleeping 1 - - - -   Tired, decreased energy 1 - - - -   Change in appetite 2 - - - -   Feeling bad or failure about yourself  1 - - - -   Trouble concentrating 1 - - - -   Moving slowly or fidgety/restless 1 - - - -   Suicidal thoughts 0 - - - -   PHQ-9 Score 9 - - - -   Difficult doing work/chores Somewhat difficult - - - -      Psychosocial Evaluation and Intervention:     Psychosocial Evaluation - 04/17/16 1721      Psychosocial Evaluation & Interventions   Interventions Stress management education;Relaxation education;Encouraged to exercise with the program and follow exercise prescription   Continued Psychosocial Services Needed Yes      Psychosocial Re-Evaluation:     Psychosocial Re-Evaluation    Row Name 04/17/16 1721 04/22/16 1711 05/29/16 1519 07/03/16 1140       Psychosocial Re-Evaluation   Interventions Stress management education;Relaxation education;Encouraged to attend Cardiac Rehabilitation for the exercise  - Relaxation education;Encouraged to attend Cardiac Rehabilitation for the exercise;Stress management education Encouraged to attend Cardiac Rehabilitation for the exercise;Stress management education;Relaxation education    Comments  - pt feels her legs are stronger and able to participate in more activities at home. pt reports tripping over her dog causing her to fall at home a few weeks ago. pt denies injury. pt states she used skills learned in functional fitness class to get herself up from floor.  pt continues to demonstrate progression in cardiac rehab. pt is tolerating group exercise program without difficulty. pt attendance is altered by hemodialysis treatments causing fatigue.   pt continues to actively participate in group  exercise program to the best of her ability. pt absences are due to fatigue and medical  conditions.     Continued Psychosocial Services Needed Yes Yes Yes Yes       Vocational Rehabilitation: Provide vocational rehab assistance to qualifying candidates.   Vocational Rehab Evaluation & Intervention:     Vocational Rehab - 03/19/16 1504      Initial Vocational Rehab Evaluation & Intervention   Assessment shows need for Vocational Rehabilitation No      Education: Education Goals: Education classes will be provided on a weekly basis, covering required topics. Participant will state understanding/return demonstration of topics presented.  Learning Barriers/Preferences:     Learning Barriers/Preferences - 03/19/16 0953      Learning Barriers/Preferences   Learning Barriers Sight;Hearing  have reading glasses and hearing aid   Learning Preferences Written Material;Skilled Demonstration      Education Topics: Count Your Pulse:  -Group instruction provided by verbal instruction, demonstration, patient participation and written materials to support subject.  Instructors address importance of being able to find your pulse and how to count your pulse when at home without a heart monitor.  Patients get hands on experience counting their pulse with staff help and individually. Flowsheet Row CARDIAC REHAB PHASE II EXERCISE from 06/17/2016 in [place]  Date  04/17/16  Instruction Review Code  2- meets goals/outcomes      Heart Attack, Angina, and Risk Factor Modification:  -Group instruction  provided by verbal instruction, video, and written materials to support subject.  Instructors address signs and symptoms of angina and heart attacks.    Also discuss risk factors for heart disease and how to make changes to improve heart health risk factors.   Functional Fitness:  -Group instruction provided by verbal instruction, demonstration, patient participation, and written materials to support subject.  Instructors address safety measures for  doing things around the house.  Discuss how to get up and down off the floor, how to pick things up properly, how to safely get out of a chair without assistance, and balance training. Flowsheet Row CARDIAC REHAB PHASE II EXERCISE from 06/17/2016 in [place]  Date  04/03/16  Educator  Seward Carol, [place], ACSM CCES  Instruction Review Code  R- Review/reinforce      Meditation and Mindfulness:  -Group instruction provided by verbal instruction, patient participation, and written materials to support subject.  Instructor addresses importance of mindfulness and meditation practice to help reduce stress and improve awareness.  Instructor also leads participants through a meditation exercise.  Flowsheet Row CARDIAC REHAB PHASE II EXERCISE from 06/17/2016 in [place]  Date  05/20/16  Instruction Review Code  2- meets goals/outcomes      Stretching for Flexibility and Mobility:  -Group instruction provided by verbal instruction, patient participation, and written materials to support subject.  Instructors lead participants through series of stretches that are designed to increase flexibility thus improving mobility.  These stretches are additional exercise for major muscle groups that are typically performed during regular warm up and cool down.   Hands Only CPR Anytime:  -Group instruction provided by verbal instruction, video, patient participation and written materials to support subject.  Instructors co-teach with [facility] video for hands only CPR.  Participants get hands on experience with mannequins.   Nutrition I class: Heart Healthy Eating:  -Group instruction provided by PowerPoint slides, verbal discussion, and written materials to support subject matter. The instructor gives an explanation and review of the Therapeutic Lifestyle Changes diet recommendations, which includes a discussion on lipid goals, dietary fat, sodium, fiber,  plant stanol/sterol esters, sugar, and the components of a well-balanced, healthy diet. Flowsheet Row CARDIAC REHAB PHASE II EXERCISE from 06/17/2016 in [place]  Date  04/22/16  Educator  RD  Instruction Review Code  Not applicable [class handout given]      Nutrition II class: Lifestyle Skills:  -Group instruction provided by PowerPoint slides, verbal discussion, and written materials to support subject matter. The instructor gives an explanation and review of label reading, grocery shopping for heart health, heart healthy recipe modifications, and ways to make healthier choices when eating out. Flowsheet Row CARDIAC REHAB PHASE II EXERCISE from 06/17/2016 in [place]  Date  04/22/16  Educator  RD  Instruction Review Code  Not applicable [class handouts given]      Diabetes Question & Answer:  -Group instruction provided by PowerPoint slides, verbal discussion, and written materials to support subject matter. The instructor gives an explanation and review of diabetes co-morbidities, pre- and post-prandial blood glucose goals, pre-exercise blood glucose goals, signs, symptoms, and treatment of hypoglycemia and hyperglycemia, and foot care basics. Flowsheet Row CARDIAC REHAB PHASE II EXERCISE from 06/17/2016 in [place]  Date  04/24/16  Educator  RD  Instruction Review Code  2- meets goals/outcomes      Diabetes Blitz:  -  Group instruction provided by PowerPoint slides, verbal discussion, and written materials to support subject matter. The instructor gives an explanation and review of the physiology behind type 1 and type 2 diabetes, diabetes medications and rational behind using different medications, pre- and post-prandial blood glucose recommendations and Hemoglobin A1c goals, diabetes diet, and exercise including blood glucose guidelines for exercising safely.  Flowsheet Row CARDIAC REHAB  PHASE II EXERCISE from 06/17/2016 in [place]  Date  04/22/16  Educator  RD  Instruction Review Code  Not applicable [class handout given]      Portion Distortion:  -Group instruction provided by PowerPoint slides, verbal discussion, written materials, and food models to support subject matter. The instructor gives an explanation of serving size versus portion size, changes in portions sizes over the last 20 years, and what consists of a serving from each food group. Flowsheet Row CARDIAC REHAB PHASE II EXERCISE from 06/17/2016 in [employer]  Date  05/13/16  Educator  RD  Instruction Review Code  2- meets goals/outcomes      Stress Management:  -Group instruction provided by verbal instruction, video, and written materials to support subject matter.  Instructors review role of stress in heart disease and how to cope with stress positively.   Flowsheet Row CARDIAC REHAB PHASE II EXERCISE from 06/17/2016 in [place]  Date  06/17/16 [t]  Instruction Review Code  2- meets goals/outcomes      Exercising on Your Own:  -Group instruction provided by verbal instruction, power point, and written materials to support subject.  Instructors discuss benefits of exercise, components of exercise, frequency and intensity of exercise, and end points for exercise.  Also discuss use of nitroglycerin and activating EMS.  Review options of places to exercise outside of rehab.  Review guidelines for sex with heart disease. Flowsheet Row CARDIAC REHAB PHASE II EXERCISE from 06/17/2016 in [place]  Date  04/15/16  Educator  Seward Carol, MS,ACSM CCES  Instruction Review Code  2- meets goals/outcomes      Cardiac Drugs I:  -Group instruction provided by verbal instruction and written materials to support subject.  Instructor reviews cardiac drug classes: antiplatelets,  anticoagulants, beta blockers, and statins.  Instructor discusses reasons, side effects, and lifestyle considerations for each drug class. Flowsheet Row CARDIAC REHAB PHASE II EXERCISE from 06/17/2016 in [place]  Date  04/29/16  Educator  pharmD  Instruction Review Code  2- meets goals/outcomes      Cardiac Drugs II:  -Group instruction provided by verbal instruction and written materials to support subject.  Instructor reviews cardiac drug classes: angiotensin converting enzyme inhibitors (ACE-I), angiotensin II receptor blockers (ARBs), nitrates, and calcium channel blockers.  Instructor discusses reasons, side effects, and lifestyle considerations for each drug class. Flowsheet Row CARDIAC REHAB PHASE II EXERCISE from 06/17/2016 in [place]  Date  05/27/16  Instruction Review Code  2- meets goals/outcomes      Anatomy and Physiology of the Circulatory System:  -Group instruction provided by verbal instruction, video, and written materials to support subject.  Reviews functional anatomy of heart, how it relates to various diagnoses, and what role the heart plays in the overall system. Flowsheet Row CARDIAC REHAB PHASE II EXERCISE from 06/17/2016 in [place]  Date  06/03/16  Instruction Review Code  2- meets goals/outcomes  Knowledge Questionnaire Score:     Knowledge Questionnaire Score - 03/19/16 1113      Knowledge Questionnaire Score   Pre Score 19/24      Core Components/Risk Factors/Patient Goals at Admission:     Personal Goals and Risk Factors at Admission - 03/19/16 1141      Core Components/Risk Factors/Patient Goals on Admission    Weight Management Yes;Weight Loss   Intervention Weight Management: Provide education and appropriate resources to help participant work on and attain dietary goals.;Weight Management: Develop a combined nutrition and exercise program  designed to reach desired caloric intake, while maintaining appropriate intake of nutrient and fiber, sodium and fats, and appropriate energy expenditure required for the weight goal.   Expected Outcomes Short Term: Continue to assess and modify interventions until short term weight is achieved;Long Term: Adherence to nutrition and physical activity/exercise program aimed toward attainment of established weight goal;Weight Maintenance: Understanding of the daily nutrition guidelines, which includes 25-35% calories from fat, 7% or less cal from saturated fats, less than 200mg  cholesterol, less than 1.5gm of sodium, & 5 or more servings of fruits and vegetables daily;Weight Loss: Understanding of general recommendations for a balanced deficit meal plan, which promotes 1-2 lb weight loss per week and includes a negative energy balance of [phone] kcal/d;Understanding recommendations for meals to include 15-35% energy as protein, 25-35% energy from fat, 35-60% energy from carbohydrates, less than 200mg  of dietary cholesterol, 20-35 gm of total fiber daily   Increase Strength and Stamina Yes   Hypertension Yes   Intervention Provide education on lifestyle modifcations including regular physical activity/exercise, weight management, moderate sodium restriction and increased consumption of fresh fruit, vegetables, and low fat dairy, alcohol moderation, and smoking cessation.;Monitor prescription use compliance.   Expected Outcomes Short Term: Continued assessment and intervention until BP is < 140/35mm HG in hypertensive participants. < 130/40mm HG in hypertensive participants with diabetes, heart failure or chronic kidney disease.;Long Term: Maintenance of blood pressure at goal levels.   Lipids Yes   Intervention Provide education and support for participant on nutrition & aerobic/resistive exercise along with prescribed medications to achieve LDL 70mg , HDL >40mg .   Expected Outcomes Short Term: Participant  states understanding of desired cholesterol values and is compliant with medications prescribed. Participant is following exercise prescription and nutrition guidelines.;Long Term: Cholesterol controlled with medications as prescribed, with individualized exercise RX and with personalized nutrition plan. Value goals: LDL < 70mg , HDL > 40 mg.   Intervention Offer individual and/or small group education and counseling on adjustment to heart disease, stress management and health-related lifestyle change. Teach and support self-help strategies.;Refer participants experiencing significant psychosocial distress to appropriate mental health specialists for further evaluation and treatment. When possible, include family members and significant others in education/counseling sessions.   Expected Outcomes Short Term: Participant demonstrates changes in health-related behavior, relaxation and other stress management skills, ability to obtain effective social support, and compliance with psychotropic medications if prescribed.;Long Term: Emotional wellbeing is indicated by absence of clinically significant psychosocial distress or social isolation.      Core Components/Risk Factors/Patient Goals Review:      Goals and Risk Factor Review    Row Name 04/30/16 1654 06/24/16 1724           Core Components/Risk Factors/Patient Goals Review   Personal Goals Review Other;Improve shortness of breath with ADL's  -      Review pt is able to walk dog with walker for 15 minutes  Due to persistant medical complications  pt feels as though she is not progressing as well as she would like      Expected Outcomes Pt will continue to walk the dog and perform other ADL's with less SOB/ fatigue. Pt will be able to perform ADL's with less SOB and fatigue         Core Components/Risk Factors/Patient Goals at Discharge (Final Review):      Goals and Risk Factor Review - 06/24/16 1724      Core Components/Risk Factors/Patient  Goals Review   Review Due to persistant medical complications pt feels as though she is not progressing as well as she would like   Expected Outcomes Pt will be able to perform ADL's with less SOB and fatigue      ITP Comments:     ITP Comments    Row Name 03/19/16 0951           ITP Comments Dr. Fransico Him, Medical Director          Comments:Pt is making expected progress toward personal goals after completing 29 sessions. Recommend continued exercise and life style modification education including  stress management and relaxation techniques to decrease cardiac risk profile.

## 2016-07-06 ENCOUNTER — Telehealth (HOSPITAL_COMMUNITY): Payer: Self-pay | Admitting: Cardiac Rehabilitation

## 2016-07-06 LAB — PH, BODY FLUID: PH, BODY FLUID: 8.1

## 2016-07-06 NOTE — Telephone Encounter (Signed)
-----   Message from Belva Crome, MD sent at 07/04/2016  1:44 PM EDT ----- Regarding: RE: cardiac rehab  Next available ----- Message ----- From: Lowell Guitar, RN Sent: 07/03/2016  11:36 AM To: Belva Crome, MD Subject: cardiac rehab                                  Dear Dr. Tamala Julian,  [place] had US guided thoracentesis today.  When is it appropriate for her to return to cardiac rehab?  Thank you, Andi Hence, RN, BSN Cardiac Pulmonary Rehab

## 2016-07-08 ENCOUNTER — Encounter (HOSPITAL_COMMUNITY)
Admission: RE | Admit: 2016-07-08 | Discharge: 2016-07-08 | Disposition: A | Discharge status: Home / Self Care | Payer: Medicare Other | Source: Ambulatory Visit | Attending: Interventional Cardiology | Admitting: Interventional Cardiology

## 2016-07-08 ENCOUNTER — Telehealth: Payer: Self-pay | Admitting: Interventional Cardiology

## 2016-07-08 DIAGNOSIS — Z955 Presence of coronary angioplasty implant and graft: Secondary | ICD-10-CM

## 2016-07-08 LAB — CULTURE, BODY FLUID W GRAM STAIN -BOTTLE

## 2016-07-08 LAB — CULTURE, BODY FLUID-BOTTLE: CULTURE: NO GROWTH

## 2016-07-08 NOTE — Telephone Encounter (Signed)
Spoke with pt. Pt aware of culture results with verbal understanding.

## 2016-07-08 NOTE — Telephone Encounter (Signed)
Follow Up:     Pt says she is returning a call from yesterday from this office,she did not know who called her. Please call her today after 4.

## 2016-07-10 ENCOUNTER — Encounter (HOSPITAL_COMMUNITY)
Admission: RE | Admit: 2016-07-10 | Discharge: 2016-07-10 | Disposition: A | Discharge status: Home / Self Care | Payer: Medicare Other | Source: Ambulatory Visit | Attending: Interventional Cardiology | Admitting: Interventional Cardiology

## 2016-07-10 DIAGNOSIS — Z955 Presence of coronary angioplasty implant and graft: Secondary | ICD-10-CM | POA: Diagnosis not present

## 2016-07-15 ENCOUNTER — Encounter (HOSPITAL_COMMUNITY)
Admission: RE | Admit: 2016-07-15 | Discharge: 2016-07-15 | Disposition: A | Discharge status: Home / Self Care | Payer: Medicare Other | Source: Ambulatory Visit | Attending: Interventional Cardiology | Admitting: Interventional Cardiology

## 2016-07-15 ENCOUNTER — Other Ambulatory Visit: Payer: Self-pay | Admitting: Internal Medicine

## 2016-07-15 DIAGNOSIS — Z955 Presence of coronary angioplasty implant and graft: Secondary | ICD-10-CM

## 2016-07-15 DIAGNOSIS — E7800 Pure hypercholesterolemia, unspecified: Secondary | ICD-10-CM

## 2016-07-17 ENCOUNTER — Encounter (HOSPITAL_COMMUNITY)
Admission: RE | Admit: 2016-07-17 | Discharge: 2016-07-17 | Disposition: A | Discharge status: Home / Self Care | Payer: Medicare Other | Source: Ambulatory Visit | Attending: Interventional Cardiology | Admitting: Interventional Cardiology

## 2016-07-17 DIAGNOSIS — N186 End stage renal disease: Secondary | ICD-10-CM | POA: Diagnosis not present

## 2016-07-17 DIAGNOSIS — E785 Hyperlipidemia, unspecified: Secondary | ICD-10-CM | POA: Diagnosis not present

## 2016-07-17 DIAGNOSIS — Z7982 Long term (current) use of aspirin: Secondary | ICD-10-CM | POA: Insufficient documentation

## 2016-07-17 DIAGNOSIS — I1311 Hypertensive heart and chronic kidney disease without heart failure, with stage 5 chronic kidney disease, or end stage renal disease: Secondary | ICD-10-CM | POA: Insufficient documentation

## 2016-07-17 DIAGNOSIS — E871 Hypo-osmolality and hyponatremia: Secondary | ICD-10-CM | POA: Diagnosis not present

## 2016-07-17 DIAGNOSIS — I2510 Atherosclerotic heart disease of native coronary artery without angina pectoris: Secondary | ICD-10-CM | POA: Insufficient documentation

## 2016-07-17 DIAGNOSIS — Z79899 Other long term (current) drug therapy: Secondary | ICD-10-CM | POA: Insufficient documentation

## 2016-07-17 DIAGNOSIS — Z955 Presence of coronary angioplasty implant and graft: Secondary | ICD-10-CM | POA: Insufficient documentation

## 2016-07-17 DIAGNOSIS — E1122 Type 2 diabetes mellitus with diabetic chronic kidney disease: Secondary | ICD-10-CM | POA: Insufficient documentation

## 2016-07-22 ENCOUNTER — Encounter (HOSPITAL_COMMUNITY)
Admission: RE | Admit: 2016-07-22 | Discharge: 2016-07-22 | Disposition: A | Discharge status: Home / Self Care | Payer: Medicare Other | Source: Ambulatory Visit | Attending: Interventional Cardiology | Admitting: Interventional Cardiology

## 2016-07-22 DIAGNOSIS — Z955 Presence of coronary angioplasty implant and graft: Secondary | ICD-10-CM | POA: Diagnosis not present

## 2016-07-24 ENCOUNTER — Encounter (HOSPITAL_COMMUNITY)
Admission: RE | Admit: 2016-07-24 | Discharge: 2016-07-24 | Disposition: A | Discharge status: Home / Self Care | Payer: Medicare Other | Source: Ambulatory Visit | Attending: Interventional Cardiology | Admitting: Interventional Cardiology

## 2016-07-24 DIAGNOSIS — Z955 Presence of coronary angioplasty implant and graft: Secondary | ICD-10-CM

## 2016-07-27 ENCOUNTER — Encounter (HOSPITAL_COMMUNITY): Payer: Self-pay

## 2016-07-27 ENCOUNTER — Encounter (HOSPITAL_COMMUNITY)
Admission: RE | Admit: 2016-07-27 | Discharge: 2016-07-27 | Disposition: A | Discharge status: Home / Self Care | Payer: Medicare Other | Source: Ambulatory Visit | Attending: Interventional Cardiology | Admitting: Interventional Cardiology

## 2016-07-27 DIAGNOSIS — Z955 Presence of coronary angioplasty implant and graft: Secondary | ICD-10-CM

## 2016-07-27 NOTE — Progress Notes (Signed)
Discharge Summary  Patient Details  Name: [name] [name] MRN: [id number] Date of Birth: 15-Jul-1951 Referring Provider:   Flowsheet Row CARDIAC REHAB PHASE II ORIENTATION from 03/19/2016 in [place]  Referring Provider  Smith,Henry MD       NUMBER OF VISITS:  53  Reason for Discharge:  Patient reached a stable level of exercise.    Pt plans to exercise on her own at [facility] exercise classes and walking on the campus. Pt is also concerned about her continued hypotension at hemodialysis. Pt rides the [employer] bus 1.5 hours before getting to dialysis. Pt encouraged to walk around a few minutes prior to receiving dialysis in hopes to increase BP.   Pt congratulated on her success with cardiac rehab. Pt states she feels that she has slow progress with her functional abilities with  less dyspnea   History  Smoking Status  . Never Smoker  Smokeless Tobacco  . Never Used    Diagnosis:  02/03/16 Stented coronary artery  ADL UCSD:   Initial Exercise Prescription:     Initial Exercise Prescription - 03/19/16 1100      Date of Initial Exercise RX and Referring Provider   Date 03/19/16   Referring Provider Smith,Henry MD     Recumbant Bike   Level 1   Minutes 10   METs 1.2     NuStep   Level 1   Minutes 10   METs 1.2     Track   Laps 5   Minutes 10   METs 1.2     Intensity   THRR 40-80% of Max Heartrate 62-123   Ratings of Perceived Exertion 11-13   Perceived Dyspnea 0-4     Progression   Progression Continue to progress workloads to maintain intensity without signs/symptoms of physical distress.     Resistance Training   Training Prescription Yes   Weight 1lb   Reps 10-12      Discharge Exercise Prescription (Final Exercise Prescription Changes):     Exercise Prescription Changes - 07/27/16 1600      Exercise Review   Progression Yes     Response to Exercise   Blood Pressure (Admit) 128/82   Blood Pressure  (Exercise) 110/70   Blood Pressure (Exit) 124/78   Heart Rate (Admit) 66 bpm   Heart Rate (Exercise) 96 bpm   Heart Rate (Exit) 62 bpm   Rating of Perceived Exertion (Exercise) 13   Duration Progress to 30 minutes of continuous aerobic without signs/symptoms of physical distress   Intensity THRR unchanged     Progression   Progression Continue to progress workloads to maintain intensity without signs/symptoms of physical distress.   Average METs 2.4     Resistance Training   Training Prescription Yes   Weight 2lbs   Reps 10-12     NuStep   Level 3   Minutes 30   METs 2.4     Home Exercise Plan   Plans to continue exercise at [employer] [employer] (comment)  [facility] - group exercise/ short walking bouts   Frequency Add 1 additional day to program exercise sessions.  incrementally increase walk distance... only on saturdays      Functional Capacity:     [place] Name 03/19/16 1117 08/03/16 1643       6 Minute Walk   Phase Initial Discharge    Distance 512 feet 841 feet    Distance % Change  -  64.26 %    Walk Time 4 minutes 6 minutes    # of Rest Breaks 1  2 min 0    MPH 1.5 1.6    METS 1.5 1.9    RPE 13 13    VO2 Peak 5.14 6.65    Symptoms Yes (comment) No    Comments fatigue  -    Resting HR 70 bpm 68 bpm    Resting BP 106/64 192/60    Max Ex. HR 81 bpm 92 bpm    Max Ex. BP 148/84 106/64    2 Minute Post BP 126/80 124/78       Psychological, QOL, Others - Outcomes: PHQ 2/9: Depression screen [facility] 2/9 08/10/2016 07/27/2016 03/23/2016 02/24/2016 12/11/2015  Decreased Interest 0 0 0 0 1  Down, Depressed, Hopeless 0 1 2 0 0  PHQ - 2 Score 0 1 2 0 1  Altered sleeping - 1 1 - -  Tired, decreased energy - 1 1 - -  Change in appetite - 1 2 - -  Feeling bad or failure about yourself  - 1 1 - -  Trouble concentrating - 0 1 - -  Moving slowly or fidgety/restless - 1 1 - -  Suicidal thoughts - 0 0 - -  PHQ-9 Score - 6 9 - -  Difficult doing  work/chores - Somewhat difficult Somewhat difficult - -    Quality of Life:     Quality of Life - 07/27/16 1417      Quality of Life Scores   Health/Function Post 14.18 %   Socioeconomic Post 17.33 %   Psych/Spiritual Post 15.57 %   Family Post 17.5 %   GLOBAL Post 15.47 %      Personal Goals: Goals established at orientation with interventions provided to work toward goal.     Personal Goals and Risk Factors at Admission - 03/19/16 1141      Core Components/Risk Factors/Patient Goals on Admission    Weight Management Yes;Weight Loss   Intervention Weight Management: Provide education and appropriate resources to help participant work on and attain dietary goals.;Weight Management: Develop a combined nutrition and exercise program designed to reach desired caloric intake, while maintaining appropriate intake of nutrient and fiber, sodium and fats, and appropriate energy expenditure required for the weight goal.   Expected Outcomes Short Term: Continue to assess and modify interventions until short term weight is achieved;Long Term: Adherence to nutrition and physical activity/exercise program aimed toward attainment of established weight goal;Weight Maintenance: Understanding of the daily nutrition guidelines, which includes 25-35% calories from fat, 7% or less cal from saturated fats, less than 200mg  cholesterol, less than 1.5gm of sodium, & 5 or more servings of fruits and vegetables daily;Weight Loss: Understanding of general recommendations for a balanced deficit meal plan, which promotes 1-2 lb weight loss per week and includes a negative energy balance of [phone] kcal/d;Understanding recommendations for meals to include 15-35% energy as protein, 25-35% energy from fat, 35-60% energy from carbohydrates, less than 200mg  of dietary cholesterol, 20-35 gm of total fiber daily   Increase Strength and Stamina Yes   Hypertension Yes   Intervention Provide education on lifestyle  modifcations including regular physical activity/exercise, weight management, moderate sodium restriction and increased consumption of fresh fruit, vegetables, and low fat dairy, alcohol moderation, and smoking cessation.;Monitor prescription use compliance.   Expected Outcomes Short Term: Continued assessment and intervention until BP is < 140/14mm HG in hypertensive participants. < 130/81mm HG in hypertensive participants with  diabetes, heart failure or chronic kidney disease.;Long Term: Maintenance of blood pressure at goal levels.   Lipids Yes   Intervention Provide education and support for participant on nutrition & aerobic/resistive exercise along with prescribed medications to achieve LDL 70mg , HDL >40mg .   Expected Outcomes Short Term: Participant states understanding of desired cholesterol values and is compliant with medications prescribed. Participant is following exercise prescription and nutrition guidelines.;Long Term: Cholesterol controlled with medications as prescribed, with individualized exercise RX and with personalized nutrition plan. Value goals: LDL < 70mg , HDL > 40 mg.   Intervention Offer individual and/or small group education and counseling on adjustment to heart disease, stress management and health-related lifestyle change. Teach and support self-help strategies.;Refer participants experiencing significant psychosocial distress to appropriate mental health specialists for further evaluation and treatment. When possible, include family members and significant others in education/counseling sessions.   Expected Outcomes Short Term: Participant demonstrates changes in health-related behavior, relaxation and other stress management skills, ability to obtain effective social support, and compliance with psychotropic medications if prescribed.;Long Term: Emotional wellbeing is indicated by absence of clinically significant psychosocial distress or social isolation.       Personal  Goals Discharge:     Goals and Risk Factor Review    Row Name 04/30/16 1654 06/24/16 1724 07/27/16 1654         Core Components/Risk Factors/Patient Goals Review   Personal Goals Review Other;Improve shortness of breath with ADL's  - Increase Strength and Stamina;Other     Review pt is able to walk dog with walker for 15 minutes  Due to persistant medical complications pt feels as though she is not progressing as well as she would like Pt continues to walk the dog and able to do group fitness classes at [facility].     Expected Outcomes Pt will continue to walk the dog and perform other ADL's with less SOB/ fatigue. Pt will be able to perform ADL's with less SOB and fatigue Pt will be able to continue to walk the dog and exercise safely        Nutrition & Weight - Outcomes:     Pre Biometrics - 03/19/16 1125      Pre Biometrics   Waist Circumference 40 inches   Hip Circumference 45.25 inches   Waist to Hip Ratio 0.88 %   Triceps Skinfold 19 mm   % Body Fat 31 %   Grip Strength 22 kg   Flexibility 0 in   Single Leg Stand 0 seconds         Post Biometrics - 08/03/16 1628       Post  Biometrics   Waist Circumference 38 inches   Hip Circumference 43.5 inches   Waist to Hip Ratio 0.87 %   Triceps Skinfold 18 mm   % Body Fat 38.6 %   Grip Strength 22.5 kg   Single Leg Stand 0.97 seconds      Nutrition:     Nutrition Therapy & Goals - 03/20/16 0902      Nutrition Therapy   Diet Renal, Diabetic, Therapeutic Lifestyle Change     Personal Nutrition Goals   Personal Goal #1 0.5-2 lb wt loss/wk to a goal wt loss of 6-15 lb at graduation from [place], educate and counsel regarding individualized specific dietary modifications aiming towards targeted core components such as weight, hypertension, lipid management, diabetes, heart failure and other comorbidities.   Expected Outcomes Short  Term Goal: Understand basic  principles of dietary content, such as calories, fat, sodium, cholesterol and nutrients.;Long Term Goal: Adherence to prescribed nutrition plan.      Nutrition Discharge:     Nutrition Assessments - 08/07/16 1220      MEDFICTS Scores   Pre Score 54   Post Score 34   Score Difference -20      Education Questionnaire Score:     Knowledge Questionnaire Score - 07/27/16 1417      Knowledge Questionnaire Score   Post Score 21/24      Goals reviewed with patient; copy given to patient.

## 2016-07-27 NOTE — Progress Notes (Deleted)
Discharge Summary  Patient Details  Name: [name] [name] MRN: [id number] Date of Birth: 1950-12-21 Referring Provider:   Flowsheet Row CARDIAC REHAB PHASE II ORIENTATION from 03/19/2016 in [place]  Referring Provider  Smith,Henry MD       Number of Visits: 10   Reason for Discharge:  Patient reached a stable level of exercise.  Pt plans to exercise on her own at [facility] exercise classes and walking on the campus. Pt is also concerned about her continued hypotension at hemodialysis. Pt rides the [facility] bus 1.5 hours before getting to dialysis. Pt encouraged to walk around a few minutes prior to receiving dialysis in hopes to increase BP.   Pt congratulated on her success with cardiac rehab. Pt states she feels that she has slow progress with her functional abilities with  less dyspnea  Smoking History:  History  Smoking Status  . Never Smoker  Smokeless Tobacco  . Never Used    Diagnosis:  02/03/16 Stented coronary artery  ADL UCSD:   Initial Exercise Prescription:     Initial Exercise Prescription - 03/19/16 1100      Date of Initial Exercise RX and Referring Provider   Date 03/19/16   Referring Provider Smith,Henry MD     Recumbant Bike   Level 1   Minutes 10   METs 1.2     NuStep   Level 1   Minutes 10   METs 1.2     Track   Laps 5   Minutes 10   METs 1.2     Intensity   THRR 40-80% of Max Heartrate 62-123   Ratings of Perceived Exertion 11-13   Perceived Dyspnea 0-4     Progression   Progression Continue to progress workloads to maintain intensity without signs/symptoms of physical distress.     Resistance Training   Training Prescription Yes   Weight 1lb   Reps 10-12      Discharge Exercise Prescription (Final Exercise Prescription Changes):     Exercise Prescription Changes - 07/09/16 1200      Exercise Review   Progression Yes     Response to Exercise   Blood Pressure (Admit) 108/64   Blood  Pressure (Exercise) 116/64   Blood Pressure (Exit) 102/72   Heart Rate (Admit) 74 bpm   Heart Rate (Exercise) 83 bpm   Heart Rate (Exit) 74 bpm   Rating of Perceived Exertion (Exercise) 12   Duration Progress to 30 minutes of continuous aerobic without signs/symptoms of physical distress   Intensity THRR unchanged     Progression   Progression Continue to progress workloads to maintain intensity without signs/symptoms of physical distress.   Average METs 2.3     Resistance Training   Training Prescription Yes   Weight 2lbs   Reps 10-12     NuStep   Level 3   Minutes 30   METs 2.3     Home Exercise Plan   Plans to continue exercise at [employer] (comment)  [facility] - group exercise/ short walking bouts   Frequency Add 1 additional day to program exercise sessions.  incrementally increase walk distance... only on saturdays      Functional Capacity:     6 Minute Walk    Row Name 03/19/16 1117         6 Minute Walk   Phase Initial     Distance 512 feet     Walk Time 4 minutes     #  of Rest Breaks 1  2 min     MPH 1.5     METS 1.5     RPE 13     VO2 Peak 5.14     Symptoms Yes (comment)     Comments fatigue     Resting HR 70 bpm     Resting BP 106/64     Max Ex. HR 81 bpm     Max Ex. BP 148/84     2 Minute Post BP 126/80        Psychological, QOL, Others - Outcomes: PHQ 2/9: Depression screen [facility] 2/9 07/27/2016 03/23/2016 02/24/2016 12/11/2015 09/16/2015  Decreased Interest 0 0 0 1 0  Down, Depressed, Hopeless 1 2 0 0 0  PHQ - 2 Score 1 2 0 1 0  Altered sleeping 1 1 - - -  Tired, decreased energy 1 1 - - -  Change in appetite 1 2 - - -  Feeling bad or failure about yourself  1 1 - - -  Trouble concentrating 0 1 - - -  Moving slowly or fidgety/restless 1 1 - - -  Suicidal thoughts 0 0 - - -  PHQ-9 Score 6 9 - - -  Difficult doing work/chores Somewhat difficult Somewhat difficult - - -    Quality of Life:     Quality of Life - 07/27/16  1417      Quality of Life Scores   Health/Function Post 14.18 %   Socioeconomic Post 17.33 %   Psych/Spiritual Post 15.57 %   Family Post 17.5 %   GLOBAL Post 15.47 %      Personal Goals: Goals established at orientation with interventions provided to work toward goal.     Personal Goals and Risk Factors at Admission - 03/19/16 1141      Core Components/Risk Factors/Patient Goals on Admission    Weight Management Yes;Weight Loss   Intervention Weight Management: Provide education and appropriate resources to help participant work on and attain dietary goals.;Weight Management: Develop a combined nutrition and exercise program designed to reach desired caloric intake, while maintaining appropriate intake of nutrient and fiber, sodium and fats, and appropriate energy expenditure required for the weight goal.   Expected Outcomes Short Term: Continue to assess and modify interventions until short term weight is achieved;Long Term: Adherence to nutrition and physical activity/exercise program aimed toward attainment of established weight goal;Weight Maintenance: Understanding of the daily nutrition guidelines, which includes 25-35% calories from fat, 7% or less cal from saturated fats, less than 200mg  cholesterol, less than 1.5gm of sodium, & 5 or more servings of fruits and vegetables daily;Weight Loss: Understanding of general recommendations for a balanced deficit meal plan, which promotes 1-2 lb weight loss per week and includes a negative energy balance of [phone] kcal/d;Understanding recommendations for meals to include 15-35% energy as protein, 25-35% energy from fat, 35-60% energy from carbohydrates, less than 200mg  of dietary cholesterol, 20-35 gm of total fiber daily   Increase Strength and Stamina Yes   Hypertension Yes   Intervention Provide education on lifestyle modifcations including regular physical activity/exercise, weight management, moderate sodium restriction and increased  consumption of fresh fruit, vegetables, and low fat dairy, alcohol moderation, and smoking cessation.;Monitor prescription use compliance.   Expected Outcomes Short Term: Continued assessment and intervention until BP is < 140/40mm HG in hypertensive participants. < 130/87mm HG in hypertensive participants with diabetes, heart failure or chronic kidney disease.;Long Term: Maintenance of blood pressure at goal levels.   Lipids  Yes   Intervention Provide education and support for participant on nutrition & aerobic/resistive exercise along with prescribed medications to achieve LDL 70mg , HDL >40mg .   Expected Outcomes Short Term: Participant states understanding of desired cholesterol values and is compliant with medications prescribed. Participant is following exercise prescription and nutrition guidelines.;Long Term: Cholesterol controlled with medications as prescribed, with individualized exercise RX and with personalized nutrition plan. Value goals: LDL < 70mg , HDL > 40 mg.   Intervention Offer individual and/or small group education and counseling on adjustment to heart disease, stress management and health-related lifestyle change. Teach and support self-help strategies.;Refer participants experiencing significant psychosocial distress to appropriate mental health specialists for further evaluation and treatment. When possible, include family members and significant others in education/counseling sessions.   Expected Outcomes Short Term: Participant demonstrates changes in health-related behavior, relaxation and other stress management skills, ability to obtain effective social support, and compliance with psychotropic medications if prescribed.;Long Term: Emotional wellbeing is indicated by absence of clinically significant psychosocial distress or social isolation.       Personal Goals Discharge:     Goals and Risk Factor Review    Row Name 04/30/16 1654 06/24/16 1724           Core  Components/Risk Factors/Patient Goals Review   Personal Goals Review Other;Improve shortness of breath with ADL's  -      Review pt is able to walk dog with walker for 15 minutes  Due to persistant medical complications pt feels as though she is not progressing as well as she would like      Expected Outcomes Pt will continue to walk the dog and perform other ADL's with less SOB/ fatigue. Pt will be able to perform ADL's with less SOB and fatigue         Nutrition & Weight - Outcomes:     Pre Biometrics - 03/19/16 1125      Pre Biometrics   Waist Circumference 40 inches   Hip Circumference 45.25 inches   Waist to Hip Ratio 0.88 %   Triceps Skinfold 19 mm   % Body Fat 31 %   Grip Strength 22 kg   Flexibility 0 in   Single Leg Stand 0 seconds       Nutrition:     Nutrition Therapy & Goals - 03/20/16 0902      Nutrition Therapy   Diet Renal, Diabetic, Therapeutic Lifestyle Change     Personal Nutrition Goals   Personal Goal #1 0.5-2 lb wt loss/wk to a goal wt loss of 6-15 lb at graduation from [place], educate and counsel regarding individualized specific dietary modifications aiming towards targeted core components such as weight, hypertension, lipid management, diabetes, heart failure and other comorbidities.   Expected Outcomes Short Term Goal: Understand basic principles of dietary content, such as calories, fat, sodium, cholesterol and nutrients.;Long Term Goal: Adherence to prescribed nutrition plan.      Nutrition Discharge:     Nutrition Assessments - 04/22/16 1453      MEDFICTS Scores   Pre Score 54      Education Questionnaire Score:     Knowledge Questionnaire Score - 07/27/16 1417      Knowledge Questionnaire Score   Post Score 21/24      Goals reviewed with patient; copy given to patient.

## 2016-08-09 ENCOUNTER — Encounter: Payer: Self-pay | Admitting: Internal Medicine

## 2016-08-09 DIAGNOSIS — I255 Ischemic cardiomyopathy: Secondary | ICD-10-CM | POA: Insufficient documentation

## 2016-08-09 NOTE — Progress Notes (Addendum)
   [facility] Phone: [phone]   Date of Visit: 08/10/2016   HPI:  Patient presents today for a well woman exam.   Periods: postmenopausal; denies vaginal bleeding   Contraception: none Pelvic symptoms: no current vaginal symptoms such as vaginal discharge/bleeding/irritation Sexual activity: no STD Screening: not indicated  Pap smear status: last pap smear- patient is not sure, possibly over 10 years; no abnormal pap smear to her knowledge Exercise: cardiac rehab, also physical program at [facility] three days a week Diet: reports overally trying to have a well rounded diet; try to avoid sweets/minimize sweets Smoking: no Alcohol: no  Drugs: no  Eye: next eye appt in October   Of note, patient is currently taking Plavix and ASA for antiplatelet therapy. Per cardiology note, she is a plavix non-responder. Patient is to call cardiology to clarify if she needs to be on Plavix/ASA or Plavix/Brilinta  ROS: See HPI. Additionally no chest pain, sob. Does have chronic cough that sometimes makes her vomit.   [place]:  Cancers in family: sister recently diagnosed with ovarian cancer  PMH: CAD s/p stents in 01/2016, hx of unstable angina DMII with retinopathy  ESRD with anemia Chronic hx of N/V: 02/2013 moderate gastric emptying delay Depression  PHYSICAL EXAM: BP (!) 150/66   Pulse 68   Temp 98.2 F (36.8 C) (Oral)   Wt 158 lb (71.7 kg)   SpO2 98%   BMI 28.90 kg/m  Gen: NAD, pleasant, cooperative HEENT: NCAT, PERRL, no palpable thyromegaly or anterior cervical lymphadenopathy Heart: RRR, no murmurs Lungs: CTAB, NWOB Abdomen: soft, nontender to palpation Neuro: grossly nonfocal, speech normal GU: normal appearing external genitalia without lesions. Vagina is moist with white discharge. Cervix normal in appearance; friable when obtaining sample for cytology, vaginal atrophy. No cervical motion tenderness or tenderness on bimanual exam. No adnexal  masses.   ASSESSMENT/PLAN:  # Health maintenance:  -STD screening: not indicated -pap smear: Although patient is 64yo, she has not had adequate screening for cervical cancer through her history. No cytology in her chart. We discussed this and patient opted to do cervical cancer screening today. Pap today  -mammogram: provided contact information to make an appointment  -DEXA: provided contact information to make an appointment  -immunizations: received Flu shot at another facility; UTD on PNA vaccination; reports she received Zostavax 1 year ago (recommneded that she provide documentation for our records)  - colorectal cancer screen: referral to GI for colonoscopy   FOLLOW UP: Follow up in 1 month for chronic N/V  Smiley Houseman, MD PGY [place]

## 2016-08-10 ENCOUNTER — Ambulatory Visit (INDEPENDENT_AMBULATORY_CARE_PROVIDER_SITE_OTHER): Payer: Medicare Other | Admitting: Internal Medicine

## 2016-08-10 ENCOUNTER — Other Ambulatory Visit (HOSPITAL_COMMUNITY)
Admission: RE | Admit: 2016-08-10 | Discharge: 2016-08-10 | Disposition: A | Discharge status: Home / Self Care | Payer: Medicare Other | Source: Ambulatory Visit | Attending: Family Medicine | Admitting: Family Medicine

## 2016-08-10 ENCOUNTER — Encounter: Payer: Self-pay | Admitting: Internal Medicine

## 2016-08-10 VITALS — BP 150/66 | HR 68 | Temp 98.2°F | Wt 158.0 lb

## 2016-08-10 DIAGNOSIS — Z1211 Encounter for screening for malignant neoplasm of colon: Secondary | ICD-10-CM

## 2016-08-10 DIAGNOSIS — Z01419 Encounter for gynecological examination (general) (routine) without abnormal findings: Secondary | ICD-10-CM | POA: Diagnosis present

## 2016-08-10 DIAGNOSIS — Z0000 Encounter for general adult medical examination without abnormal findings: Secondary | ICD-10-CM

## 2016-08-10 DIAGNOSIS — Z1151 Encounter for screening for human papillomavirus (HPV): Secondary | ICD-10-CM | POA: Insufficient documentation

## 2016-08-10 DIAGNOSIS — Z124 Encounter for screening for malignant neoplasm of cervix: Secondary | ICD-10-CM

## 2016-08-10 DIAGNOSIS — Z1212 Encounter for screening for malignant neoplasm of rectum: Secondary | ICD-10-CM | POA: Diagnosis not present

## 2016-08-10 NOTE — Patient Instructions (Addendum)
Call your heart doctor and ask which blood thinner medications you need to be on. Ask if it is aspirin and Brilinta OR Aspirin and plavix.   Please call to make an appointment for your mammogram and bone density screenings.   Follow up with me in 1 month

## 2016-08-12 ENCOUNTER — Encounter: Payer: Self-pay | Admitting: Internal Medicine

## 2016-08-12 LAB — CYTOLOGY - PAP

## 2016-08-12 NOTE — Progress Notes (Signed)
Sent letter reporting normal pap smear

## 2016-09-01 NOTE — Progress Notes (Signed)
   [facility] Phone: [phone]   Date of Visit: 09/02/2016   HPI:   Cough:  - for 1.5 years  - dry cough - usually uses cough drop that are over the counter which helps  - over the counter cough syrup do not work - reports of cough all day - sometimes has post-tussive emesis  - uses phenergan PRN - no rhinorrhea - reports of intermittent post-nasal drip  - reports of intermittent heartburn but rare - no personal history of smoking; lived with a smoker for 42 years  - no history of asthma  - father had cancer of the esophagus  - aggravating factors: sometimes exercise; no association with outside or inside.   ROS: See HPI.  [place]:  PMH: Hx of Low Blood Pressure  CAD s/p stents in 01/2016, hx of unstable angina Ischemic Cardiomyopathy DMII with retinopathy  ESRD with anemia Chronic hx of N/V: 02/2013 moderate gastric emptying delay Depression with anxiety  PHYSICAL EXAM: BP (!) 114/48 (BP Location: Right Arm, Patient Position: Sitting, Cuff Size: Normal)   Pulse 62   Temp 97.5 F (36.4 C) (Oral)   Wt 153 lb 9.6 oz (69.7 kg)   SpO2 94%   BMI 28.09 kg/m  GEN: NAD  HEENT: OP wnl, nares normla; no cervical adenopathy  CV: RRR, no murmurs, rubs, or gallops PULM: CTAB, normal effort  ASSESSMENT/PLAN:  Chronic Cough: Unclear in etiology. Differentials include reflux (less likely consistent with symptoms), post-nasal drip vs COPD with second hand smoke.  - will try Flonase for 2-3 weeks to see if symptoms improve  - PFT with Dr. Jamie Brookes, MD PGY [place]

## 2016-09-02 ENCOUNTER — Ambulatory Visit (INDEPENDENT_AMBULATORY_CARE_PROVIDER_SITE_OTHER): Payer: Medicare Other | Admitting: Internal Medicine

## 2016-09-02 VITALS — BP 114/48 | HR 62 | Temp 97.5°F | Wt 153.6 lb

## 2016-09-02 DIAGNOSIS — R053 Chronic cough: Secondary | ICD-10-CM

## 2016-09-02 DIAGNOSIS — R05 Cough: Secondary | ICD-10-CM

## 2016-09-02 MED ORDER — FLUTICASONE PROPIONATE 50 MCG/ACT NA SUSP: 2.0000 | Freq: Every day | NASAL | 0 refills | Status: DC

## 2016-09-02 NOTE — Patient Instructions (Signed)
Let's try Flonase for your cough. If this does not help with your cough in about 1 month.  Please make a visit with Dr. Valentina Lucks for lung function test.

## 2016-09-04 ENCOUNTER — Ambulatory Visit (INDEPENDENT_AMBULATORY_CARE_PROVIDER_SITE_OTHER): Payer: Medicare Other | Admitting: Pharmacist

## 2016-09-04 ENCOUNTER — Encounter: Payer: Self-pay | Admitting: Pharmacist

## 2016-09-04 DIAGNOSIS — R059 Cough, unspecified: Secondary | ICD-10-CM

## 2016-09-04 DIAGNOSIS — R05 Cough: Secondary | ICD-10-CM | POA: Diagnosis not present

## 2016-09-04 MED ORDER — MOMETASONE FURO-FORMOTEROL FUM 200-5 MCG/ACT IN AERO: 2.0000 | INHALATION_SPRAY | Freq: Two times a day (BID) | RESPIRATORY_TRACT | 0 refills | Status: DC

## 2016-09-04 NOTE — Assessment & Plan Note (Signed)
Spirometry evaluation reveals Moderately severe restrictive lung disease. Post nebulized albuterol tx revealed no significant improvement, though there was a 13% improvement in FEV1 and a 12% improvement in FEF25-75. Demonstrates some degree of reversibility. Patient coughed frequently during nebulized albuterol tx. Cough frequency slightly reduced after nebulized albuterol tx.  Patient has been experiencing cough for over 1 year and taking cough drops to treat. Patient has been experiencing increased difficulty breathing for 3-4 days and taking nothing for this. Begin using Dulera (mometasone-formoterol) 1 puff 2 times a day (one puff AM, one puff PM) for one month. Educated patient on purpose, proper use, potential adverse effects including risk of esophageal candidiasis and need to rinse mouth after each use.

## 2016-09-04 NOTE — Patient Instructions (Signed)
It was very nice to see you today!  Today we found that you have reduced lung function. To treat this, we gave you a new medication called Dulera. Please use 1 puff of the Dulera inhaler two times a day (one puff in the morning, one puff in the evening). This inhaler should last for one month.  Make an appointment with Dr. [name] for one month from today.

## 2016-09-04 NOTE — Progress Notes (Signed)
   S:    Patient arrives in mild respiratory distress, ambulating slowly with the use of a walker, and has a persistent cough that requires consistent use of Ricola honey and lemon cough drops. Presents for lung function evaluation.  Patient was referred on 09/02/2016.  Patient was last seen by Primary Care Provider on 09/02/2016.  Patient reports breathing has been limited. CAT score of 30, positive for highly symptomatic airway disease.   Patient denies use of asthma medications.  O: See "scanned report" or Documentation Flowsheet (discrete results - PFTs) for  Spirometry results. Patient provided fair to good effort while attempting spirometry. Coughed throughout pre-test, nebulizer treatment, and post-test.  Albuterol Neb  Lot# [id number]     Exp. February 2019  A/P: Spirometry evaluation reveals Moderately severe restrictive lung disease. Post nebulized albuterol tx revealed no significant improvement, though there was a 13% improvement in FEV1 and a 12% improvement in FEF25-75. Demonstrates some degree of reversibility. Patient coughed frequently during nebulized albuterol tx. Cough frequency slightly reduced after nebulized albuterol tx.  Patient has been experiencing cough for over 1 year and taking cough drops to treat. Patient has been experiencing increased difficulty breathing for 3-4 days and taking nothing for this. Begin using Dulera (mometasone-formoterol) 1 puff 2 times a day (one puff AM, one puff PM) for one month. Educated patient on purpose, proper use, potential adverse effects including risk of esophageal candidiasis and need to rinse mouth after each use. Reviewed results of pulmonary function tests. Patient verbalized understanding of results and education.  Written pt instructions provided.  F/U Clinic visit in 1 month with Dr. [name]. Total time in face to face counseling 45 minutes.  Patient seen with Shonna Chock, PharmD Candidate.

## 2016-09-05 NOTE — Progress Notes (Signed)
Patient ID: [name] [name], female   DOB: 02-27-1951, 65 y.o.   MRN: [id number] Reviewed: Agree with Dr. Graylin Shiver documentation and management.

## 2016-10-12 ENCOUNTER — Other Ambulatory Visit: Payer: Self-pay | Admitting: Internal Medicine

## 2016-10-12 DIAGNOSIS — R05 Cough: Secondary | ICD-10-CM

## 2016-10-12 DIAGNOSIS — R059 Cough, unspecified: Secondary | ICD-10-CM

## 2016-10-12 NOTE — Telephone Encounter (Signed)
Needs refill on her inhaler.  She is having cataract surgery dec 4 and doesn't want to be coughin.  [facility] on [address]

## 2016-10-13 MED ORDER — MOMETASONE FURO-FORMOTEROL FUM 200-5 MCG/ACT IN AERO: 2.0000 | INHALATION_SPRAY | Freq: Two times a day (BID) | RESPIRATORY_TRACT | 0 refills | Status: DC

## 2016-10-16 ENCOUNTER — Telehealth: Payer: Self-pay | Admitting: Internal Medicine

## 2016-10-16 NOTE — Telephone Encounter (Signed)
Called patient to see if she is interested in coming to [facility] on 12/7. She reports she has dialysis that day but will try to get this rearranged. Currently in will hold the 4pm slot for her. Patient is to call and let me know.

## 2016-10-21 ENCOUNTER — Ambulatory Visit: Payer: Medicare Other | Admitting: Internal Medicine

## 2016-10-21 NOTE — Progress Notes (Signed)
[facility] Phone: [phone]   Date of Visit: 10/22/2016   HPI:  Ms. Krauser is her for [facility] clinc. Dr. Erin Hearing, precepting physician present during the visit.   We discussed patient's chronic cough and weight loss.  Patient reports she has been having a nagging cough for about 1 year. She has intermittent post-tussive emesis at times. It feels like "there are cotton balls" in her throat. She reports she is having "a lot of shortness of breath again". She has some shortness of breath at rest for the past 1.5 years. She reports recently she feels that she cannot walk as far. She is in the walking club and notices this. She has a history of tobacco smoke exposure as her husband smoked when he was alive. She had PFTs done which showed a restrictive pattern and was prescribed Dulera. She was also recently prescribed Flonase. Reports these medications help " a little". She is also on Claritin and a PPI. She also was found to have a left pleural effusion recently and had a paracentesis done; fluid analysis was benign. She wonders if there is re-accumulation of fluid.   She reports she has not tried to lose weight, however she moved to a senior independent living facility and they have prepared meals with "small portions" and she is not able to get extra food. Since moving there, she has started to be more active; she goes to weight class three times a week and is on the walking club. She has yet to schedule a colonoscopy and mammogram. She had a normal PAP smear this year.  She also reports of a rash on her upper back and upper arms bilaterally. The area is very pruritic. No new detergents, lotions, fragrances.   ROS: See HPI.  [place]:  PMH: Hx of Low Blood Pressure  CAD s/p stents in 01/2016, hx of unstable angina Ischemic Cardiomyopathy DMII with retinopathy  ESRD with anemia Chronic hx of N/V: 02/2013 moderate gastric emptying delay Depression with  anxiety   PHYSICAL EXAM: BP 140/64   Pulse 80   Temp 98 F (36.7 C) (Oral)   Wt 148 lb (67.1 kg)   BMI 26.22 kg/m   Neck: no adenopathy, normal ROM Lungs: normal effort. Normal breath sounds except mildly decreased at the left base compared to the right base. No crackles or wheezing.  Skin: excoriations noted on upper back and upper arms bilaterally.   ASSESSMENT/PLAN: 1. Cough Unclear in etiology. She is on PPI and allergy medications but no significant improvement. Can stop flonase if not working. Can try nasal saline to determine if this will improve her cough. Additionally for her shortness of breath, will obtain CXR to determine if there is a reaccumulation of pleural fluid. Also with her restrictive lung disease, will refer her to pulmonology for further evaluation of this.  - mometasone-formoterol (DULERA) 200-5 MCG/ACT AERO; Inhale 2 puffs into the lungs 2 (two) times daily at 8 am and 10 pm.  Dispense: 1 Inhaler; Refill: 0 - DG Chest 2 View; Future  2. Loss of weight Possibly due to lifestyle changes noted above even though patient reports she has not been trying to lose weight. Discussed the importance of obtaining colonoscopy and mammogram; referrals were made at prior visits. Patient to make appointments. Will also obtain the following labs.  - CBC with Differential/Platelet - COMPLETE METABOLIC PANEL WITH GFR - TSH - Sedimentation Rate  3. Chronic cough - Ambulatory referral to Pulmonology  4. Restrictive  lung disease - Ambulatory referral to Pulmonology  5. Neurodermatitis, localized Asked to try Eucerin or Vaseline   Smiley Houseman, MD PGY [place]

## 2016-10-22 ENCOUNTER — Encounter: Payer: Self-pay | Admitting: Internal Medicine

## 2016-10-22 ENCOUNTER — Ambulatory Visit (INDEPENDENT_AMBULATORY_CARE_PROVIDER_SITE_OTHER): Payer: Medicare Other | Admitting: Internal Medicine

## 2016-10-22 VITALS — BP 140/64 | HR 80 | Temp 98.0°F | Wt 148.0 lb

## 2016-10-22 DIAGNOSIS — R059 Cough, unspecified: Secondary | ICD-10-CM

## 2016-10-22 DIAGNOSIS — R05 Cough: Secondary | ICD-10-CM

## 2016-10-22 DIAGNOSIS — R053 Chronic cough: Secondary | ICD-10-CM

## 2016-10-22 DIAGNOSIS — J984 Other disorders of lung: Secondary | ICD-10-CM | POA: Diagnosis not present

## 2016-10-22 DIAGNOSIS — L280 Lichen simplex chronicus: Secondary | ICD-10-CM | POA: Diagnosis not present

## 2016-10-22 DIAGNOSIS — R634 Abnormal weight loss: Secondary | ICD-10-CM | POA: Diagnosis not present

## 2016-10-22 LAB — CBC WITH DIFFERENTIAL/PLATELET
BASOS PCT: 2 %
Basophils Absolute: 96 cells/uL (ref 0–200)
Eosinophils Absolute: 240 cells/uL (ref 15–500)
Eosinophils Relative: 5 %
HCT: 33.3 % — ABNORMAL LOW (ref 35.0–45.0)
Hemoglobin: 10.5 g/dL — ABNORMAL LOW (ref 11.7–15.5)
LYMPHS PCT: 13 %
Lymphs Abs: 624 cells/uL — ABNORMAL LOW (ref 850–3900)
MCH: 32.5 pg (ref 27.0–33.0)
MCHC: 31.5 g/dL — ABNORMAL LOW (ref 32.0–36.0)
MCV: 103.1 fL — ABNORMAL HIGH (ref 80.0–100.0)
MONOS PCT: 18 %
MPV: 10.3 fL (ref 7.5–12.5)
Monocytes Absolute: 864 cells/uL (ref 200–950)
Neutro Abs: 2976 cells/uL (ref 1500–7800)
Neutrophils Relative %: 62 %
PLATELETS: 215 10*3/uL (ref 140–400)
RBC: 3.23 MIL/uL — AB (ref 3.80–5.10)
RDW: 17 % — AB (ref 11.0–15.0)
WBC: 4.8 10*3/uL (ref 3.8–10.8)

## 2016-10-22 MED ORDER — MOMETASONE FURO-FORMOTEROL FUM 200-5 MCG/ACT IN AERO: 2.0000 | INHALATION_SPRAY | Freq: Two times a day (BID) | RESPIRATORY_TRACT | 0 refills | Status: DC

## 2016-10-22 NOTE — Patient Instructions (Signed)
Lets get some lab work to evaluate your weight loss. Lets get an chest x-ray as well. I made a referral to the pulmonologist.

## 2016-10-23 LAB — COMPLETE METABOLIC PANEL WITH GFR
ALT: 11 U/L (ref 6–29)
AST: 19 U/L (ref 10–35)
Albumin: 3.7 g/dL (ref 3.6–5.1)
Alkaline Phosphatase: 109 U/L (ref 33–130)
BUN: 14 mg/dL (ref 7–25)
CHLORIDE: 97 mmol/L — AB (ref 98–110)
CO2: 32 mmol/L — AB (ref 20–31)
CREATININE: 2.62 mg/dL — AB (ref 0.50–0.99)
Calcium: 8.6 mg/dL (ref 8.6–10.4)
GFR, Est African American: 21 mL/min — ABNORMAL LOW (ref >=60)
GFR, Est Non African American: 18 mL/min — ABNORMAL LOW (ref >=60)
Glucose, Bld: 119 mg/dL — ABNORMAL HIGH (ref 65–99)
Potassium: 3.7 mmol/L (ref 3.5–5.3)
Sodium: 140 mmol/L (ref 135–146)
Total Bilirubin: 0.6 mg/dL (ref 0.2–1.2)
Total Protein: 6.2 g/dL (ref 6.1–8.1)

## 2016-10-23 LAB — TSH: TSH: 1.82 mIU/L

## 2016-10-23 LAB — SEDIMENTATION RATE: SED RATE: 10 mm/h (ref 0–30)

## 2016-10-29 ENCOUNTER — Telehealth: Payer: Self-pay | Admitting: Internal Medicine

## 2016-10-29 NOTE — Telephone Encounter (Signed)
Called patient to remind her to get CXR which was ordered at last visit. Went to [facility]. Left message that we will call her back later.

## 2016-11-03 ENCOUNTER — Telehealth: Payer: Self-pay | Admitting: Internal Medicine

## 2016-11-03 NOTE — Telephone Encounter (Signed)
Attempted to get in contact with patient again regarding labs and also to remind her that she has a CXR ordered. Went to voicemail and left message asking to call back clinic

## 2016-11-04 ENCOUNTER — Telehealth: Payer: Self-pay | Admitting: *Deleted

## 2016-11-04 NOTE — Telephone Encounter (Signed)
Patient calling to inform MD that she scheduled her appointment with pulmonology for 12/09/16 with Dr. Lamonte Sakai. Also relayed message to patient that she ned to have chest xray done. Patient states she will have this done.

## 2016-11-23 ENCOUNTER — Ambulatory Visit
Admission: RE | Admit: 2016-11-23 | Discharge: 2016-11-23 | Disposition: A | Discharge status: Home / Self Care | Payer: Medicare Other | Source: Ambulatory Visit | Attending: Family Medicine | Admitting: Family Medicine

## 2016-11-23 DIAGNOSIS — R05 Cough: Secondary | ICD-10-CM

## 2016-11-23 DIAGNOSIS — R059 Cough, unspecified: Secondary | ICD-10-CM

## 2016-11-24 ENCOUNTER — Telehealth: Payer: Self-pay | Admitting: Internal Medicine

## 2016-11-24 DIAGNOSIS — J90 Pleural effusion, not elsewhere classified: Secondary | ICD-10-CM

## 2016-11-24 NOTE — Telephone Encounter (Signed)
LM for patient as well as her sister with appointment date and time.  Monday January 15th at 11am, she will need to arrive at 10:45am and register at admitting at the [facility]. Jazmin Hartsell,CMA

## 2016-11-24 NOTE — Telephone Encounter (Signed)
CXR obtained 11/23/16 shows persistent left pleural effusion. Patient was found to have L pleural effusion in 06/2016 on cxr by Cardiology and had thoracentesis at that time.   Called patient to report this. She reports that the following days will NOT work for her as far as scheduling the thoracentesis: Tuesdays/Thursdays/Saturdays. Additionally 12/09/16 will not work as she already has another appointment. She reports that we can leave message on voicemail or sister [name] regarding the appointment date and time. She knows that she will also get blood work at that time.   Will forward to Jazmin to schedule.

## 2016-11-30 ENCOUNTER — Ambulatory Visit (HOSPITAL_COMMUNITY)
Admission: RE | Admit: 2016-11-30 | Discharge: 2016-11-30 | Disposition: A | Discharge status: Home / Self Care | Payer: Medicare Other | Source: Ambulatory Visit | Attending: General Surgery | Admitting: General Surgery

## 2016-11-30 ENCOUNTER — Ambulatory Visit (HOSPITAL_COMMUNITY)
Admission: RE | Admit: 2016-11-30 | Discharge: 2016-11-30 | Disposition: A | Discharge status: Home / Self Care | Payer: Medicare Other | Source: Ambulatory Visit | Attending: Family Medicine | Admitting: Family Medicine

## 2016-11-30 DIAGNOSIS — J90 Pleural effusion, not elsewhere classified: Secondary | ICD-10-CM

## 2016-11-30 DIAGNOSIS — Z9889 Other specified postprocedural states: Secondary | ICD-10-CM | POA: Insufficient documentation

## 2016-11-30 LAB — BODY FLUID CELL COUNT WITH DIFFERENTIAL
EOS FL: 0 %
LYMPHS FL: 52 %
Monocyte-Macrophage-Serous Fluid: 43 % — ABNORMAL LOW (ref 50–90)
NEUTROPHIL FLUID: 5 % (ref 0–25)
Total Nucleated Cell Count, Fluid: 277 cu mm (ref 0–1000)

## 2016-11-30 LAB — LACTATE DEHYDROGENASE, PLEURAL OR PERITONEAL FLUID: LD, Fluid: 104 U/L — ABNORMAL HIGH (ref 3–23)

## 2016-11-30 LAB — GLUCOSE, SEROUS FLUID: GLUCOSE FL: 136 mg/dL

## 2016-11-30 LAB — PROTEIN, BODY FLUID

## 2016-11-30 MED ORDER — LIDOCAINE HCL (PF) 1 % IJ SOLN
INTRAMUSCULAR | Status: AC
  Filled 2016-11-30: qty 10

## 2016-11-30 NOTE — Procedures (Signed)
Ultrasound-guided diagnostic and therapeutic left thoracentesis performed yielding 0.6 liters of serosanguineous colored fluid. No immediate complications. Follow-up chest x-ray pending.      [name] [name] E 12:06 PM 11/30/2016

## 2016-12-01 LAB — MISC LABCORP TEST (SEND OUT): LABCORP TEST CODE: 88062

## 2016-12-01 LAB — GRAM STAIN

## 2016-12-02 ENCOUNTER — Telehealth: Payer: Self-pay | Admitting: Internal Medicine

## 2016-12-02 NOTE — Telephone Encounter (Signed)
Please call Ms. Dubberly to remind her that she needs to do blood work (CMP and lactate dehydrogenase) so that we can fully interpret her lung fluid studies. She can call and make a lab visit to get this done

## 2016-12-05 LAB — CULTURE, BODY FLUID W GRAM STAIN -BOTTLE: Culture: NO GROWTH

## 2016-12-05 LAB — CULTURE, BODY FLUID-BOTTLE

## 2016-12-07 ENCOUNTER — Encounter: Payer: Self-pay | Admitting: *Deleted

## 2016-12-08 ENCOUNTER — Encounter: Payer: Self-pay | Admitting: *Deleted

## 2016-12-08 NOTE — Telephone Encounter (Signed)
LM ok per DPR for patient or guardian to call back and please schedule a lab appointment. Sharea Guinther,CMA

## 2016-12-09 ENCOUNTER — Encounter: Payer: Self-pay | Admitting: Emergency Medicine

## 2016-12-09 ENCOUNTER — Ambulatory Visit (INDEPENDENT_AMBULATORY_CARE_PROVIDER_SITE_OTHER): Payer: Medicare Other | Admitting: Emergency Medicine

## 2016-12-09 VITALS — BP 118/62 | HR 60 | Ht 63.0 in | Wt 151.0 lb

## 2016-12-09 DIAGNOSIS — R05 Cough: Secondary | ICD-10-CM

## 2016-12-09 DIAGNOSIS — R059 Cough, unspecified: Secondary | ICD-10-CM

## 2016-12-09 MED ORDER — ALBUTEROL SULFATE HFA 108 (90 BASE) MCG/ACT IN AERS: 2.0000 | INHALATION_SPRAY | RESPIRATORY_TRACT | 5 refills | Status: DC | PRN

## 2016-12-09 MED ORDER — BENZONATATE 200 MG PO CAPS: 200.0000 mg | ORAL_CAPSULE | Freq: Three times a day (TID) | ORAL | 1 refills | Status: DC | PRN

## 2016-12-09 MED ORDER — PANTOPRAZOLE SODIUM 40 MG PO TBEC: 40.0000 mg | DELAYED_RELEASE_TABLET | Freq: Two times a day (BID) | ORAL | 5 refills | Status: DC

## 2016-12-09 NOTE — Assessment & Plan Note (Signed)
Chronic cough of approximately 1.5 years duration. Certainly there could be multiple influences on this including her pleural effusion and some associated atelectasis. She has been treated appropriately for allergic rhinitis and acid reflux. She does state that she has some residual breakthrough symptoms that may be undertreated and contributing. On note she improved some when she was started on [facility] [place], suggesting a contribution of lower airways disease or asthma. I believe she needs complete pulmonary function testing. Until then we will continue her [facility], start her on albuterol to use in between doses if needed. I would like to increase her Protonix, continue her allergy regimen. Depending on the evaluation and response to these maneuvers she may need bronchoscopy to visualize her posterior pharynx.  Please continue your Dulera 2 puffs twice a day. Remember to rinse and gargle after using.  Please continue your loratadine (Claritin) and your fluticasone nasal spray as you are taking them Increase your Protonix to 40mg  twice a day until our next visit.  We will perform full pulmonary function testing  Start using albuterol 2 puffs if needed for shortness of breath or coughing (Use in between your scheduled doses of Dulera if needed).  Try using tessalon perles up to every 8 hours if needed for coughing.  Depending on how your cough and breathing do we may decide to evaluate further with a bronchoscopy, which will allow [place] to look down into your airways.  Follow with Dr Lamonte Sakai in 1 month

## 2016-12-09 NOTE — Patient Instructions (Addendum)
Please continue your Dulera 2 puffs twice a day. Remember to rinse and gargle after using.  Please continue your loratadine (Claritin) and your fluticasone nasal spray as you are taking them Increase your Protonix to 40mg  twice a day until our next visit.  We will perform full pulmonary function testing  Start using albuterol 2 puffs if needed for shortness of breath or coughing (Use in between your scheduled doses of Dulera if needed).  Try using tessalon perles up to every 8 hours if needed for coughing.  Depending on how your cough and breathing do we may decide to evaluate further with a bronchoscopy, which will allow [place] to look down into your airways.  Follow with Dr Lamonte Sakai in 1 month

## 2016-12-09 NOTE — Progress Notes (Signed)
Subjective:    Patient ID: [name] [name], female    DOB: February 01, 1951, 66 y.o.   MRN: [id number]  HPI 66 year old woman, never smoker, with a history of end-stage renal disease on HD (TThS), diabetes, coronary artery disease with associated ischemic cardiomyopathy, chronic left pleural effusion most recently tapped on 11/30/16 and consistent with a transudate based on low protein and LDH. She has been dealing with chronic cough and associated dyspnea for over 1.5 years. Occasionally she had post-tussive emesis. She feels that her throat is dry, has a globus sensation. She does have some nasal gtt and congestion, seems to come and go. Still has some occasional breakthrough GERD sx.   Treated with Protonix, loratadine, fluticasone nasal spray without much improvement.  PFT's have been ordered but not yet done. She was started on [facility] recently and believes the cough and the dyspnea are somewhat improved. The recent thoracentesis helped her breathing but did not change her cough. She occasionally feel that she gets choked with food / drink. Occasionally has trouble swallowing.   Review of Systems  Constitutional: Negative for fever and unexpected weight change.  HENT: Negative for congestion, dental problem, ear pain, nosebleeds, postnasal drip, rhinorrhea, sinus pressure, sneezing, sore throat and trouble swallowing.   Eyes: Negative for redness and itching.  Respiratory: Positive for cough and shortness of breath. Negative for chest tightness and wheezing.   Cardiovascular: Negative for palpitations and leg swelling.  Gastrointestinal: Negative for nausea and vomiting.  Genitourinary: Negative for dysuria.  Musculoskeletal: Negative for joint swelling.  Skin: Negative for rash.  Neurological: Negative for headaches.  Hematological: Does not bruise/bleed easily.  Psychiatric/Behavioral: Negative for dysphoric mood. The patient is not nervous/anxious.     Past Medical History:  Diagnosis  Date  . Anemia   . Coronary artery disease    a. 6/16 Cath: Severe multivessel dzs->not surg candidate 2/2 debility/poor venous conduits->Med Rx; b. 01/2016 Cath/PCI: LM 50ost, LAD 56m (2.5x32 & 2.5x20 Promus Prem DES'), D1 70ost, D2 90, LCX 95p/m (3.0x16 Promus Prem DES), 65d, RCA 60p->d, RPDA 95 (2.5x12 Promus Prem DES).  . Diabetes mellitus ([place])   . ESRD (end stage renal disease) on dialysis ([place])       . Hearing difficulty    Mild   . Hyperlipidemia   . Hypertension   . Hyponatremia   . Ischemic cardiomyopathy    a. Echo 04/2015: EF 35%. Cath same time - 45%.  . Memory loss   . Syncope   . Thrombocytopenia ([place])      Family History  Problem Relation Age of Onset  . Bone cancer Father     Of jaws   . Throat cancer Father   . Heart attack Father   . Heart disease    . Diabetes    . Stroke    . Heart attack Sister   . Stroke Sister   . Stroke Brother      Social History   Social History  . Marital status: Widowed    Spouse name: N/A  . Number of children: N/A  . Years of education: N/A   Occupational History  . Not on file.   Social History Main Topics  . Smoking status: Never Smoker  . Smokeless tobacco: Never Used  . Alcohol use No  . Drug use: No  . Sexual activity: Not on file   Other Topics Concern  . Not on file   Social History Narrative   Lives with husband [name] [name] (  born 70).    Denies alcohol, tobacco (never smoked), other drugs   Hobbies: reading           Allergies  Allergen Reactions  . Aleve [Naproxen Sodium] Other (See Comments)    Due to kidneys.  . Motrin [Ibuprofen] Other (See Comments)    Due to kidneys.  . Nsaids Other (See Comments)    KIDNEY FAILURE AND ONLY HAS PARTIAL LEFT KIDNEY REMAINING (RIGHT ONE TAKEN OUT)  . Contrast Media [Iodinated Diagnostic Agents] Itching  . Fentanyl     Facial swelling   . Tape     Please use "paper" tape     Outpatient Medications Prior to Visit  Medication Sig Dispense Refill    . acetaminophen (TYLENOL) 500 MG tablet Take 500 mg by mouth every 6 (six) hours as needed for headache.    Marland Kitchen aspirin 81 MG EC tablet Take 1 tablet (81 mg total) by mouth daily. 30 tablet 0  . atorvastatin (LIPITOR) 40 MG tablet TAKE ONE TABLET BY MOUTH IN THE MORNING 30 tablet 2  . calcium carbonate (OS-CAL - DOSED IN MG OF ELEMENTAL CALCIUM) 1250 (500 CA) MG tablet Take two (2) tablets by mouth three (3) times daily with meals and take two (2) tablets by mouth with snacks.    . loratadine (CLARITIN) 10 MG tablet Take 10 mg by mouth daily.    . midodrine (PROAMATINE) 10 MG tablet Take one (1) tablet (10 mg total) by mouth at bedtime on Mondays, Wednesdays, and Fridays. Take one (1) tablet (10 mg total) by mouth pre-dialysis on Tuesdays, Thursdays, and Saturdays.  3  . mometasone-formoterol (DULERA) 200-5 MCG/ACT AERO Inhale 2 puffs into the lungs 2 (two) times daily at 8 am and 10 pm. 1 Inhaler 0  . multivitamin (RENA-VIT) TABS tablet Take 1 tablet by mouth at bedtime. 90 tablet 3  . nitroGLYCERIN (NITROSTAT) 0.4 MG SL tablet Place 1 tablet (0.4 mg total) under the tongue every 5 (five) minutes as needed for chest pain. 5 tablet 3  . promethazine (PHENERGAN) 25 MG tablet Take 1 tablet (25 mg total) by mouth every 6 (six) hours as needed for nausea or vomiting. 30 tablet 0  . sevelamer carbonate (RENVELA) 2.4 g PACK Take two (2) packets by mouth with each meal. Twice daily on dialysis days and Three times daily on non-dialysis days.  0  . ticagrelor (BRILINTA) 90 MG TABS tablet Take 1 tablet (90 mg total) by mouth 2 (two) times daily. 60 tablet 6  . pantoprazole (PROTONIX) 40 MG tablet Take 1 tablet (40 mg total) by mouth daily. 30 tablet 0   No facility-administered medications prior to visit.         Objective:   Physical Exam Vitals:   12/09/16 1618 12/09/16 1619  BP:  118/62  Pulse:  60  SpO2:  98%  Weight: 151 lb (68.5 kg)   Height: 5\' 3"  (1.6 m)   Gen: Pleasant, kyphotic elderly  woman, in no distress,  normal affect  ENT: No lesions,  mouth clear,  oropharynx clear, no postnasal drip  Neck: No JVD, no TMG, no carotid bruits  Lungs: No use of accessory muscles, clear without rales or rhonchi  Cardiovascular: RRR, heart sounds normal, no murmur or gallops, trace pretibial peripheral edema  Musculoskeletal: No deformities, no cyanosis or clubbing  Neuro: alert, non focal  Skin: Warm, no lesions or rashes     Assessment & Plan:  Cough Chronic cough of approximately 1.5 years  duration. Certainly there could be multiple influences on this including her pleural effusion and some associated atelectasis. She has been treated appropriately for allergic rhinitis and acid reflux. She does state that she has some residual breakthrough symptoms that may be undertreated and contributing. On note she improved some when she was started on [facility], suggesting a contribution of lower airways disease or asthma. I believe she needs complete pulmonary function testing. Until then we will continue her [facility], start her on albuterol to use in between doses if needed. I would like to increase her Protonix, continue her allergy regimen. Depending on the evaluation and response to these maneuvers she may need bronchoscopy to visualize her posterior pharynx.  Please continue your Dulera 2 puffs twice a day. Remember to rinse and gargle after using.  Please continue your loratadine (Claritin) and your fluticasone nasal spray as you are taking them Increase your Protonix to 40mg  twice a day until our next visit.  We will perform full pulmonary function testing  Start using albuterol 2 puffs if needed for shortness of breath or coughing (Use in between your scheduled doses of Dulera if needed).  Try using tessalon perles up to every 8 hours if needed for coughing.  Depending on how your cough and breathing do we may decide to evaluate further with a bronchoscopy, which will allow [place] to look down  into your airways.  Follow with Dr Lamonte Sakai in 1 month  Baltazar Apo, MD, PhD 12/09/2016, 4:55 PM [facility] [phone] or if no answer [phone]

## 2016-12-10 ENCOUNTER — Other Ambulatory Visit: Payer: Self-pay | Admitting: Internal Medicine

## 2016-12-10 DIAGNOSIS — R059 Cough, unspecified: Secondary | ICD-10-CM

## 2016-12-10 DIAGNOSIS — R05 Cough: Secondary | ICD-10-CM

## 2016-12-16 ENCOUNTER — Other Ambulatory Visit: Payer: Medicare Other

## 2016-12-16 DIAGNOSIS — J90 Pleural effusion, not elsewhere classified: Secondary | ICD-10-CM

## 2016-12-16 LAB — COMPREHENSIVE METABOLIC PANEL
ALBUMIN: 3 g/dL — AB (ref 3.6–5.1)
ALK PHOS: 87 U/L (ref 33–130)
ALT: 12 U/L (ref 6–29)
AST: 22 U/L (ref 10–35)
BILIRUBIN TOTAL: 0.5 mg/dL (ref 0.2–1.2)
BUN: 37 mg/dL — ABNORMAL HIGH (ref 7–25)
CO2: 32 mmol/L — ABNORMAL HIGH (ref 20–31)
CREATININE: 3.56 mg/dL — AB (ref 0.50–0.99)
Calcium: 8 mg/dL — ABNORMAL LOW (ref 8.6–10.4)
Chloride: 98 mmol/L (ref 98–110)
Glucose, Bld: 144 mg/dL — ABNORMAL HIGH (ref 65–99)
Potassium: 3.5 mmol/L (ref 3.5–5.3)
SODIUM: 142 mmol/L (ref 135–146)
TOTAL PROTEIN: 5.4 g/dL — AB (ref 6.1–8.1)

## 2016-12-17 LAB — LACTATE DEHYDROGENASE: LDH: 143 U/L (ref 120–250)

## 2016-12-21 ENCOUNTER — Inpatient Hospital Stay (HOSPITAL_COMMUNITY)
Admission: EM | Admit: 2016-12-21 | Discharge: 2016-12-23 | DRG: 377 | Disposition: A | Discharge status: Home / Self Care | Payer: Medicare Other | Attending: Family Medicine | Admitting: Family Medicine

## 2016-12-21 ENCOUNTER — Telehealth: Payer: Self-pay | Admitting: *Deleted

## 2016-12-21 ENCOUNTER — Encounter (HOSPITAL_COMMUNITY): Payer: Self-pay

## 2016-12-21 DIAGNOSIS — Z7982 Long term (current) use of aspirin: Secondary | ICD-10-CM

## 2016-12-21 DIAGNOSIS — N186 End stage renal disease: Secondary | ICD-10-CM | POA: Diagnosis present

## 2016-12-21 DIAGNOSIS — E46 Unspecified protein-calorie malnutrition: Secondary | ICD-10-CM | POA: Diagnosis present

## 2016-12-21 DIAGNOSIS — Z79899 Other long term (current) drug therapy: Secondary | ICD-10-CM

## 2016-12-21 DIAGNOSIS — E8889 Other specified metabolic disorders: Secondary | ICD-10-CM | POA: Diagnosis present

## 2016-12-21 DIAGNOSIS — E1122 Type 2 diabetes mellitus with diabetic chronic kidney disease: Secondary | ICD-10-CM | POA: Diagnosis present

## 2016-12-21 DIAGNOSIS — I255 Ischemic cardiomyopathy: Secondary | ICD-10-CM | POA: Diagnosis present

## 2016-12-21 DIAGNOSIS — E871 Hypo-osmolality and hyponatremia: Secondary | ICD-10-CM | POA: Diagnosis present

## 2016-12-21 DIAGNOSIS — K649 Unspecified hemorrhoids: Secondary | ICD-10-CM | POA: Diagnosis present

## 2016-12-21 DIAGNOSIS — Z992 Dependence on renal dialysis: Secondary | ICD-10-CM

## 2016-12-21 DIAGNOSIS — E785 Hyperlipidemia, unspecified: Secondary | ICD-10-CM | POA: Diagnosis present

## 2016-12-21 DIAGNOSIS — K922 Gastrointestinal hemorrhage, unspecified: Secondary | ICD-10-CM

## 2016-12-21 DIAGNOSIS — I132 Hypertensive heart and chronic kidney disease with heart failure and with stage 5 chronic kidney disease, or end stage renal disease: Secondary | ICD-10-CM | POA: Diagnosis present

## 2016-12-21 DIAGNOSIS — R0602 Shortness of breath: Secondary | ICD-10-CM

## 2016-12-21 DIAGNOSIS — I5032 Chronic diastolic (congestive) heart failure: Secondary | ICD-10-CM | POA: Diagnosis present

## 2016-12-21 DIAGNOSIS — D62 Acute posthemorrhagic anemia: Secondary | ICD-10-CM | POA: Diagnosis present

## 2016-12-21 DIAGNOSIS — Z905 Acquired absence of kidney: Secondary | ICD-10-CM

## 2016-12-21 DIAGNOSIS — Z808 Family history of malignant neoplasm of other organs or systems: Secondary | ICD-10-CM

## 2016-12-21 DIAGNOSIS — K31811 Angiodysplasia of stomach and duodenum with bleeding: Secondary | ICD-10-CM | POA: Diagnosis not present

## 2016-12-21 DIAGNOSIS — Z955 Presence of coronary angioplasty implant and graft: Secondary | ICD-10-CM

## 2016-12-21 DIAGNOSIS — I2510 Atherosclerotic heart disease of native coronary artery without angina pectoris: Secondary | ICD-10-CM | POA: Diagnosis present

## 2016-12-21 DIAGNOSIS — Z823 Family history of stroke: Secondary | ICD-10-CM

## 2016-12-21 DIAGNOSIS — K31819 Angiodysplasia of stomach and duodenum without bleeding: Secondary | ICD-10-CM

## 2016-12-21 DIAGNOSIS — Z6829 Body mass index (BMI) 29.0-29.9, adult: Secondary | ICD-10-CM

## 2016-12-21 DIAGNOSIS — K219 Gastro-esophageal reflux disease without esophagitis: Secondary | ICD-10-CM | POA: Diagnosis present

## 2016-12-21 DIAGNOSIS — E113399 Type 2 diabetes mellitus with moderate nonproliferative diabetic retinopathy without macular edema, unspecified eye: Secondary | ICD-10-CM | POA: Diagnosis present

## 2016-12-21 DIAGNOSIS — Z8249 Family history of ischemic heart disease and other diseases of the circulatory system: Secondary | ICD-10-CM

## 2016-12-21 DIAGNOSIS — J449 Chronic obstructive pulmonary disease, unspecified: Secondary | ICD-10-CM | POA: Diagnosis present

## 2016-12-21 DIAGNOSIS — N2581 Secondary hyperparathyroidism of renal origin: Secondary | ICD-10-CM | POA: Diagnosis present

## 2016-12-21 DIAGNOSIS — K921 Melena: Secondary | ICD-10-CM | POA: Diagnosis not present

## 2016-12-21 DIAGNOSIS — Z7902 Long term (current) use of antithrombotics/antiplatelets: Secondary | ICD-10-CM

## 2016-12-21 DIAGNOSIS — Z833 Family history of diabetes mellitus: Secondary | ICD-10-CM

## 2016-12-21 DIAGNOSIS — K5730 Diverticulosis of large intestine without perforation or abscess without bleeding: Secondary | ICD-10-CM | POA: Diagnosis present

## 2016-12-21 LAB — CBC WITH DIFFERENTIAL/PLATELET
BASOS ABS: 0.1 10*3/uL (ref 0.0–0.1)
BASOS PCT: 1 %
Eosinophils Absolute: 0.2 10*3/uL (ref 0.0–0.7)
Eosinophils Relative: 3 %
HEMATOCRIT: 16.9 % — AB (ref 36.0–46.0)
HEMOGLOBIN: 5.3 g/dL — AB (ref 12.0–15.0)
Lymphocytes Relative: 12 %
Lymphs Abs: 1 10*3/uL (ref 0.7–4.0)
MCH: 30.5 pg (ref 26.0–34.0)
MCHC: 31.4 g/dL (ref 30.0–36.0)
MCV: 97.1 fL (ref 78.0–100.0)
MONOS PCT: 6 %
Monocytes Absolute: 0.5 10*3/uL (ref 0.1–1.0)
NEUTROS PCT: 78 %
Neutro Abs: 6.1 10*3/uL (ref 1.7–7.7)
Platelets: 209 10*3/uL (ref 150–400)
RBC: 1.74 MIL/uL — AB (ref 3.87–5.11)
RDW: 19.1 % — ABNORMAL HIGH (ref 11.5–15.5)
WBC: 7.8 10*3/uL (ref 4.0–10.5)

## 2016-12-21 LAB — COMPREHENSIVE METABOLIC PANEL
ALK PHOS: 80 U/L (ref 38–126)
ALT: 12 U/L — ABNORMAL LOW (ref 14–54)
ANION GAP: 13 (ref 5–15)
AST: 20 U/L (ref 15–41)
Albumin: 2.7 g/dL — ABNORMAL LOW (ref 3.5–5.0)
BUN: 48 mg/dL — ABNORMAL HIGH (ref 6–20)
CALCIUM: 8.2 mg/dL — AB (ref 8.9–10.3)
CO2: 26 mmol/L (ref 22–32)
Chloride: 90 mmol/L — ABNORMAL LOW (ref 101–111)
Creatinine, Ser: 5 mg/dL — ABNORMAL HIGH (ref 0.44–1.00)
GFR calc non Af Amer: 8 mL/min — ABNORMAL LOW (ref >60)
GFR, EST AFRICAN AMERICAN: 9 mL/min — AB (ref >60)
Glucose, Bld: 128 mg/dL — ABNORMAL HIGH (ref 65–99)
Potassium: 3.6 mmol/L (ref 3.5–5.1)
SODIUM: 129 mmol/L — AB (ref 135–145)
Total Bilirubin: 0.6 mg/dL (ref 0.3–1.2)
Total Protein: 5.2 g/dL — ABNORMAL LOW (ref 6.5–8.1)

## 2016-12-21 LAB — POC OCCULT BLOOD, ED: FECAL OCCULT BLD: POSITIVE — AB

## 2016-12-21 MED ORDER — SODIUM CHLORIDE 0.9 % IV SOLN
10.0000 mL/h | Freq: Once | INTRAVENOUS | Status: AC
  Administered 2016-12-21: 10 mL/h via INTRAVENOUS

## 2016-12-21 NOTE — Telephone Encounter (Signed)
Patient made an appt for 01-11-17. Jazmin Hartsell,CMA

## 2016-12-21 NOTE — ED Triage Notes (Signed)
Pt had blood work done at dialysis Saturday and was called today and directed to come here due to low hgb. Pt has had weakness x 1 week. Pt alert and oriented x 4. BP normal in triage.

## 2016-12-21 NOTE — ED Provider Notes (Signed)
[facility] DEPT Provider Note   CSN: [id number] Arrival date & time: 12/21/16  1824     History   Chief Complaint Chief Complaint  Patient presents with  . Abnormal Lab  . Anemia    HPI [name] [name] is a 66 y.o. female.  HPI   66 year old female with a history of end-stage renal disease, type 2 diabetes, anemia, memory loss, hyperlipidemia, CAD presents today with chief complaint of anemia. Patient reports that she was at dialysis on Saturday approximate 2 days ago when she had her blood drawn. She was called today and instructed to come to the emergency room for critically low hemoglobin. Patient notes over the last 2 weeks she's had dark stools and very small amounts of bright red blood per rectum. She reports this is typical of her hemorrhoids, but notes her last several days she's become weak, and slightly dizzy upon standing. Patient denies any abdominal discomfort, reports very small amount of bright red blood per rectum with last bowel movement. She denies any bleeding per rectum without bowel movements. Patient denies any blood thinners.  Patient notes that she has not had any labs drawn since middle of January other than Saturdays draw.   Past Medical History:  Diagnosis Date  . Anemia   . Coronary artery disease    a. 6/16 Cath: Severe multivessel dzs->not surg candidate 2/2 debility/poor venous conduits->Med Rx; b. 01/2016 Cath/PCI: LM 50ost, LAD 45m (2.5x32 & 2.5x20 Promus Prem DES'), D1 70ost, D2 90, LCX 95p/m (3.0x16 Promus Prem DES), 65d, RCA 60p->d, RPDA 95 (2.5x12 Promus Prem DES).  . Diabetes mellitus ([name])   . ESRD (end stage renal disease) on dialysis ([place])       . Hearing difficulty    Mild   . Hyperlipidemia   . Hypertension   . Hyponatremia   . Ischemic cardiomyopathy    a. Echo 04/2015: EF 35%. Cath same time - 45%.  . Memory loss   . Syncope   . Thrombocytopenia [facility])     Patient Active Problem List   Diagnosis Date Noted  . GI bleed  12/22/2016  . Cardiomyopathy, ischemic 08/09/2016  . Unstable angina ([place]) 02/04/2016  . CAD (coronary artery disease), native coronary artery 02/03/2016  . Cough 12/11/2015  . Hypoxia 12/03/2015  . Facial cellulitis 12/03/2015  . Facial swelling   . Angioedema 12/02/2015  . Hyperlipidemia   . Diabetes mellitus type 2 in obese ([place]) 05/07/2015  . CAD in native artery 04/25/2015  . Loss of consciousness ([place]) 04/23/2015  . Syncope 04/23/2015  . Fall   . Moderate nonproliferative diabetic retinopathy(362.05) 07/16/2014  . Other malaise and fatigue 06/12/2014  . Memory loss 06/12/2014  . Right knee pain 02/22/2014  . Trouble in sleeping 02/22/2014  . End stage renal disease ([place]) 03/16/2013  . Aftercare following surgery of the circulatory system, [place] 03/16/2013  . Nausea and vomiting 02/14/2013  . Anemia 02/14/2013  . Depression 08/28/2011  . Constipation 04/18/2011  . Low blood pressure 04/18/2011    Past Surgical History:  Procedure Laterality Date  . AV FISTULA PLACEMENT Left 03/07/2013   Procedure: ARTERIOVENOUS (AV) FISTULA CREATION;  Surgeon: Elam Dutch, MD;  Location: [facility];  Service: Vascular;  Laterality: Left;  left brachiocephalic AVF  . CARDIAC CATHETERIZATION N/A 04/25/2015   Procedure: Left Heart Cath and Coronary Angiography;  Surgeon: Belva Crome, MD;  Location: [place] CV LAB;  Service: Cardiovascular;  Laterality: N/A;  . CARDIAC CATHETERIZATION N/A 02/03/2016  Procedure: Coronary Stent Intervention;  Surgeon: Belva Crome, MD;  Location: [place] CV LAB;  Service: Cardiovascular;  Laterality: N/A;  . CORONARY STENT PLACEMENT  02/03/2016   LAD X2     CIRCUMFLEX       RCA   . ESOPHAGOGASTRODUODENOSCOPY Left 02/20/2013   Procedure: ESOPHAGOGASTRODUODENOSCOPY (EGD);  Surgeon: Cleotis Nipper, MD;  Location[facility] ENDOSCOPY;  Service: Endoscopy;  Laterality: Left;  . 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 Authorizing Provider  acetaminophen (TYLENOL) 500 MG tablet Take 500 mg by mouth every 6 (six) hours as needed for headache.    Historical Provider, MD  albuterol (PROVENTIL HFA;VENTOLIN HFA) 108 (90 Base) MCG/ACT inhaler Inhale 2 puffs into the lungs every 4 (four) hours as needed for wheezing or shortness of breath. 12/09/16   Collene Gobble, MD  aspirin 81 MG EC tablet Take 1 tablet (81 mg total) by mouth daily. 03/18/16   Smiley Houseman, MD  atorvastatin (LIPITOR) 40 MG tablet TAKE ONE TABLET BY MOUTH IN THE MORNING 07/17/16   Smiley Houseman, MD  benzonatate (TESSALON) 200 MG capsule Take 1 capsule (200 mg total) by mouth 3 (three) times daily as needed for cough. 12/09/16   Collene Gobble, MD  calcium carbonate (OS-CAL - DOSED IN MG OF ELEMENTAL CALCIUM) 1250 (500 CA) MG tablet Take two (2) tablets by mouth three (3) times daily with meals and take two (2) tablets by mouth with snacks.    Historical Provider, MD  DULERA 200-5 MCG/ACT AERO INHALE TWO PUFFS BY MOUTH TWICE DAILY AT  8  AM  AND  10  PM 12/11/16   Smiley Houseman, MD  loratadine (CLARITIN) 10 MG tablet Take 10 mg by mouth daily.    Historical Provider, MD  midodrine (PROAMATINE) 10 MG tablet Take one (1) tablet (10 mg total) by mouth at bedtime on Mondays, Wednesdays, and Fridays. Take one (1) tablet (10 mg total) by mouth pre-dialysis on Tuesdays, Thursdays, and Saturdays. 11/18/15   Historical Provider, MD  multivitamin (RENA-VIT) TABS tablet Take 1 tablet by mouth at bedtime. 09/04/14   Leone Haven, MD  nitroGLYCERIN (NITROSTAT) 0.4 MG SL tablet Place 1 tablet (0.4 mg total) under the tongue every 5 (five) minutes as needed for chest pain. 03/18/16   Smiley Houseman, MD  pantoprazole (PROTONIX) 40 MG tablet Take 1 tablet (40 mg total) by mouth 2 (two) times daily. 12/09/16   Collene Gobble, MD    promethazine (PHENERGAN) 25 MG tablet Take 1 tablet (25 mg total) by mouth every 6 (six) hours as needed for nausea or vomiting. 03/18/16   Smiley Houseman, MD  sevelamer carbonate (RENVELA) 2.4 g PACK Take two (2) packets by mouth with each meal. Twice daily on dialysis days and Three times daily on non-dialysis days. 05/18/16   Historical Provider, MD  ticagrelor (BRILINTA) 90 MG TABS tablet Take 1 tablet (90 mg total) by mouth 2 (two) times daily. 03/18/16   Smiley Houseman, MD    Family History Family History  Problem Relation Age of Onset  . Bone cancer Father     Of jaws   . Throat cancer Father   . Heart attack Father   . Heart disease    .  Diabetes    . Stroke    . Heart attack Sister   . Stroke Sister   . Stroke Brother     Social History Social History  Substance Use Topics  . Smoking status: Never Smoker  . Smokeless tobacco: Never Used  . Alcohol use No     Allergies   Aleve [naproxen sodium]; Fentanyl; Motrin [ibuprofen]; Nsaids; Tape; and Contrast media [iodinated diagnostic agents]   Review of Systems Review of Systems  All other systems reviewed and are negative.   Physical Exam Updated Vital Signs BP (!) 147/44   Pulse 61   Temp 98.4 F (36.9 C) (Oral)   Resp 23   Ht 5\' 3"  (1.6 m)   Wt 74.4 kg   SpO2 95%   BMI 29.05 kg/m   Physical Exam  Constitutional: She is oriented to person, place, and time. She appears well-developed and well-nourished.  pale  HENT:  Head: Normocephalic and atraumatic.  Eyes: Conjunctivae are normal. Pupils are equal, round, and reactive to light. Right eye exhibits no discharge. Left eye exhibits no discharge. No scleral icterus.  Neck: Normal range of motion. No JVD present. No tracheal deviation present.  Cardiovascular: Normal rate and regular rhythm.   Pulmonary/Chest: Effort normal. No stridor.  Abdominal: Soft. She exhibits no distension. There is no tenderness.  Genitourinary:  Genitourinary Comments:  Black stool noted on rectal exam   Neurological: She is alert and oriented to person, place, and time. Coordination normal.  Psychiatric: She has a normal mood and affect. Her behavior is normal. Judgment and thought content normal.  Nursing note and vitals reviewed.   ED Treatments / Results  Labs (all labs ordered are listed, but only abnormal results are displayed) Labs Reviewed  COMPREHENSIVE METABOLIC PANEL - Abnormal; Notable for the following:       Result Value   Sodium 129 (*)    Chloride 90 (*)    Glucose, Bld 128 (*)    BUN 48 (*)    Creatinine, Ser 5.00 (*)    Calcium 8.2 (*)    Total Protein 5.2 (*)    Albumin 2.7 (*)    ALT 12 (*)    GFR calc non Af Amer 8 (*)    GFR calc Af Amer 9 (*)    All other components within normal limits  CBC WITH DIFFERENTIAL/PLATELET - Abnormal; Notable for the following:    RBC 1.74 (*)    Hemoglobin 5.3 (*)    HCT 16.9 (*)    RDW 19.1 (*)    All other components within normal limits  POC OCCULT BLOOD, ED - Abnormal; Notable for the following:    Fecal Occult Bld POSITIVE (*)    All other components within normal limits  PROTIME-INR  PREPARE RBC (CROSSMATCH)  TYPE AND SCREEN    EKG  EKG Interpretation None       Radiology No results found.  Procedures Procedures (including critical care time)  Medications Ordered in ED Medications  0.9 %  sodium chloride infusion (10 mL/hr Intravenous New Bag/Given 12/21/16 2314)     Initial Impression / Assessment and Plan / ED Course  I have reviewed the triage vital signs and the nursing notes.  Pertinent labs & imaging results that were available during my care of the patient were reviewed by me and considered in my medical decision making (see chart for details).      Final Clinical Impressions(s) / ED Diagnoses   Final diagnoses:  Gastrointestinal  hemorrhage, unspecified gastrointestinal hemorrhage type    Labs: fecal occult, type and screen, CMP, CBC,  PT/INR  Imaging:   Consults:Family medicine service  Therapeutics: PRBC, normal saline  Discharge Meds:   Assessment/Plan:  66 year old female presents today with symptomatic anemia. Patient having dark stools over the last 2-3 weeks, with intermittent episodes of bright red blood. Patient presentation most consistently represents upper GI bleed with hemorrhoids. Patient denying any abdominal pain. Patient will be transfused here in the ED, consult to family medicine for admission. Patient is currently taking Brilinta and aspirin, no anticoagulation. Patient had no bright red blood per rectum on my exam, and had reassuring vital signs.    New Prescriptions New Prescriptions   No medications on file     Okey Regal, PA-C 12/22/16 [id number]    Merrily Pew, MD 12/22/16 [phone]

## 2016-12-21 NOTE — Telephone Encounter (Signed)
LM for sister [name], ok per DPR.  Please schedule an appointment for patient to be seen in clinic with PCP. Jazmin Hartsell,CMA

## 2016-12-21 NOTE — Telephone Encounter (Signed)
-----   Message from Smiley Houseman, MD sent at 12/18/2016  4:45 PM EST ----- Please call patient and ask her to make a follow up appointment soon. I would like to see how she is doing with her breathing and follow up for weight loss.   Thanks   [employer]

## 2016-12-21 NOTE — ED Notes (Signed)
Critical Hgb called from lab.  Tilda Burrow, RN (charge nurse) made aware

## 2016-12-22 ENCOUNTER — Encounter (HOSPITAL_COMMUNITY): Payer: Self-pay

## 2016-12-22 ENCOUNTER — Observation Stay (HOSPITAL_COMMUNITY): Payer: Medicare Other

## 2016-12-22 DIAGNOSIS — D62 Acute posthemorrhagic anemia: Secondary | ICD-10-CM | POA: Diagnosis present

## 2016-12-22 DIAGNOSIS — Z79899 Other long term (current) drug therapy: Secondary | ICD-10-CM | POA: Diagnosis not present

## 2016-12-22 DIAGNOSIS — E113399 Type 2 diabetes mellitus with moderate nonproliferative diabetic retinopathy without macular edema, unspecified eye: Secondary | ICD-10-CM | POA: Diagnosis present

## 2016-12-22 DIAGNOSIS — I255 Ischemic cardiomyopathy: Secondary | ICD-10-CM | POA: Diagnosis present

## 2016-12-22 DIAGNOSIS — R195 Other fecal abnormalities: Secondary | ICD-10-CM | POA: Diagnosis not present

## 2016-12-22 DIAGNOSIS — N186 End stage renal disease: Secondary | ICD-10-CM

## 2016-12-22 DIAGNOSIS — Z905 Acquired absence of kidney: Secondary | ICD-10-CM | POA: Diagnosis not present

## 2016-12-22 DIAGNOSIS — I132 Hypertensive heart and chronic kidney disease with heart failure and with stage 5 chronic kidney disease, or end stage renal disease: Secondary | ICD-10-CM | POA: Diagnosis present

## 2016-12-22 DIAGNOSIS — E1122 Type 2 diabetes mellitus with diabetic chronic kidney disease: Secondary | ICD-10-CM | POA: Diagnosis present

## 2016-12-22 DIAGNOSIS — E46 Unspecified protein-calorie malnutrition: Secondary | ICD-10-CM | POA: Diagnosis present

## 2016-12-22 DIAGNOSIS — Z8249 Family history of ischemic heart disease and other diseases of the circulatory system: Secondary | ICD-10-CM | POA: Diagnosis not present

## 2016-12-22 DIAGNOSIS — I2510 Atherosclerotic heart disease of native coronary artery without angina pectoris: Secondary | ICD-10-CM

## 2016-12-22 DIAGNOSIS — E785 Hyperlipidemia, unspecified: Secondary | ICD-10-CM | POA: Diagnosis present

## 2016-12-22 DIAGNOSIS — E8889 Other specified metabolic disorders: Secondary | ICD-10-CM | POA: Diagnosis present

## 2016-12-22 DIAGNOSIS — K31819 Angiodysplasia of stomach and duodenum without bleeding: Secondary | ICD-10-CM | POA: Diagnosis not present

## 2016-12-22 DIAGNOSIS — K31811 Angiodysplasia of stomach and duodenum with bleeding: Secondary | ICD-10-CM | POA: Diagnosis present

## 2016-12-22 DIAGNOSIS — Z955 Presence of coronary angioplasty implant and graft: Secondary | ICD-10-CM | POA: Diagnosis not present

## 2016-12-22 DIAGNOSIS — I5032 Chronic diastolic (congestive) heart failure: Secondary | ICD-10-CM | POA: Diagnosis present

## 2016-12-22 DIAGNOSIS — Z992 Dependence on renal dialysis: Secondary | ICD-10-CM

## 2016-12-22 DIAGNOSIS — D649 Anemia, unspecified: Secondary | ICD-10-CM | POA: Diagnosis not present

## 2016-12-22 DIAGNOSIS — N2581 Secondary hyperparathyroidism of renal origin: Secondary | ICD-10-CM | POA: Diagnosis present

## 2016-12-22 DIAGNOSIS — K922 Gastrointestinal hemorrhage, unspecified: Secondary | ICD-10-CM | POA: Diagnosis present

## 2016-12-22 DIAGNOSIS — K5730 Diverticulosis of large intestine without perforation or abscess without bleeding: Secondary | ICD-10-CM | POA: Diagnosis present

## 2016-12-22 DIAGNOSIS — Z833 Family history of diabetes mellitus: Secondary | ICD-10-CM | POA: Diagnosis not present

## 2016-12-22 DIAGNOSIS — E871 Hypo-osmolality and hyponatremia: Secondary | ICD-10-CM | POA: Diagnosis present

## 2016-12-22 DIAGNOSIS — K921 Melena: Secondary | ICD-10-CM | POA: Diagnosis present

## 2016-12-22 DIAGNOSIS — Z7902 Long term (current) use of antithrombotics/antiplatelets: Secondary | ICD-10-CM

## 2016-12-22 DIAGNOSIS — Z823 Family history of stroke: Secondary | ICD-10-CM | POA: Diagnosis not present

## 2016-12-22 DIAGNOSIS — Z808 Family history of malignant neoplasm of other organs or systems: Secondary | ICD-10-CM | POA: Diagnosis not present

## 2016-12-22 DIAGNOSIS — Z7982 Long term (current) use of aspirin: Secondary | ICD-10-CM | POA: Diagnosis not present

## 2016-12-22 LAB — RENAL FUNCTION PANEL
ANION GAP: 18 — AB (ref 5–15)
Albumin: 2.8 g/dL — ABNORMAL LOW (ref 3.5–5.0)
Albumin: 2.6 g/dL — ABNORMAL LOW (ref 3.5–5.0)
Anion gap: 14 (ref 5–15)
BUN: 58 mg/dL — ABNORMAL HIGH (ref 6–20)
BUN: 54 mg/dL — ABNORMAL HIGH (ref 6–20)
CALCIUM: 8.7 mg/dL — AB (ref 8.9–10.3)
CO2: 22 mmol/L (ref 22–32)
CO2: 25 mmol/L (ref 22–32)
Calcium: 8.5 mg/dL — ABNORMAL LOW (ref 8.9–10.3)
Chloride: 93 mmol/L — ABNORMAL LOW (ref 101–111)
Chloride: 96 mmol/L — ABNORMAL LOW (ref 101–111)
Creatinine, Ser: 5.57 mg/dL — ABNORMAL HIGH (ref 0.44–1.00)
Creatinine, Ser: 5.25 mg/dL — ABNORMAL HIGH (ref 0.44–1.00)
GFR calc Af Amer: 8 mL/min — ABNORMAL LOW (ref >60)
GFR calc Af Amer: 9 mL/min — ABNORMAL LOW (ref >60)
GFR calc non Af Amer: 7 mL/min — ABNORMAL LOW (ref >60)
GFR calc non Af Amer: 8 mL/min — ABNORMAL LOW (ref >60)
GLUCOSE: 122 mg/dL — AB (ref 65–99)
Glucose, Bld: 103 mg/dL — ABNORMAL HIGH (ref 65–99)
Phosphorus: 6.1 mg/dL — ABNORMAL HIGH (ref 2.5–4.6)
Phosphorus: 5.5 mg/dL — ABNORMAL HIGH (ref 2.5–4.6)
Potassium: 4.3 mmol/L (ref 3.5–5.1)
Potassium: 3.9 mmol/L (ref 3.5–5.1)
SODIUM: 133 mmol/L — AB (ref 135–145)
Sodium: 135 mmol/L (ref 135–145)

## 2016-12-22 LAB — CBC
HCT: 21.3 % — ABNORMAL LOW (ref 36.0–46.0)
HCT: 19.5 % — ABNORMAL LOW (ref 36.0–46.0)
HEMOGLOBIN: 6.8 g/dL — AB (ref 12.0–15.0)
Hemoglobin: 6.2 g/dL — CL (ref 12.0–15.0)
MCH: 30.4 pg (ref 26.0–34.0)
MCH: 30.1 pg (ref 26.0–34.0)
MCHC: 31.9 g/dL (ref 30.0–36.0)
MCHC: 31.8 g/dL (ref 30.0–36.0)
MCV: 95.1 fL (ref 78.0–100.0)
MCV: 94.7 fL (ref 78.0–100.0)
PLATELETS: 230 10*3/uL (ref 150–400)
Platelets: 211 10*3/uL (ref 150–400)
RBC: 2.24 MIL/uL — ABNORMAL LOW (ref 3.87–5.11)
RBC: 2.06 MIL/uL — ABNORMAL LOW (ref 3.87–5.11)
RDW: 18.1 % — ABNORMAL HIGH (ref 11.5–15.5)
RDW: 18.1 % — ABNORMAL HIGH (ref 11.5–15.5)
WBC: 7.6 10*3/uL (ref 4.0–10.5)
WBC: 7.3 10*3/uL (ref 4.0–10.5)

## 2016-12-22 LAB — PREPARE RBC (CROSSMATCH)

## 2016-12-22 LAB — PROTIME-INR
INR: 1.08
Prothrombin Time: 14 seconds (ref 11.4–15.2)

## 2016-12-22 LAB — MRSA PCR SCREENING: MRSA by PCR: POSITIVE — AB

## 2016-12-22 MED ORDER — ALTEPLASE 2 MG IJ SOLR: 2.0000 mg | Freq: Once | INTRAMUSCULAR | Status: DC | PRN

## 2016-12-22 MED ORDER — PROMETHAZINE HCL 25 MG PO TABS: 25.0000 mg | ORAL_TABLET | Freq: Four times a day (QID) | ORAL | Status: DC | PRN

## 2016-12-22 MED ORDER — DARBEPOETIN ALFA 200 MCG/0.4ML IJ SOSY: 200.0000 ug | PREFILLED_SYRINGE | INTRAMUSCULAR | Status: DC

## 2016-12-22 MED ORDER — MIDODRINE HCL 5 MG PO TABS
10.0000 mg | ORAL_TABLET | ORAL | Status: DC
  Administered 2016-12-22: 10 mg via ORAL

## 2016-12-22 MED ORDER — MUPIROCIN 2 % EX OINT
1.0000 "application " | TOPICAL_OINTMENT | Freq: Two times a day (BID) | CUTANEOUS | Status: DC
  Administered 2016-12-22 (×2): 1 via NASAL
  Filled 2016-12-22: qty 22

## 2016-12-22 MED ORDER — SODIUM CHLORIDE 0.9 % IV SOLN: 125.0000 mg | INTRAVENOUS | Status: DC

## 2016-12-22 MED ORDER — BENZONATATE 100 MG PO CAPS: 200.0000 mg | ORAL_CAPSULE | Freq: Three times a day (TID) | ORAL | Status: DC | PRN

## 2016-12-22 MED ORDER — CHLORHEXIDINE GLUCONATE CLOTH 2 % EX PADS: 6.0000 | MEDICATED_PAD | Freq: Every day | CUTANEOUS | Status: DC

## 2016-12-22 MED ORDER — RENA-VITE PO TABS
1.0000 | ORAL_TABLET | Freq: Every day | ORAL | Status: DC
  Administered 2016-12-22: 1 via ORAL
  Filled 2016-12-22: qty 1

## 2016-12-22 MED ORDER — DOXERCALCIFEROL 4 MCG/2ML IV SOLN
INTRAVENOUS | Status: AC
  Filled 2016-12-22: qty 2

## 2016-12-22 MED ORDER — PEG-KCL-NACL-NASULF-NA ASC-C 100 G PO SOLR
0.5000 | Freq: Once | ORAL | Status: AC
Start: 2016-12-22 — End: 2016-12-22
  Administered 2016-12-22: 100 g via ORAL
  Filled 2016-12-22: qty 1

## 2016-12-22 MED ORDER — CALCIUM CARBONATE 1250 (500 CA) MG PO TABS
2.0000 | ORAL_TABLET | Freq: Two times a day (BID) | ORAL | Status: DC
  Administered 2016-12-22: 1000 mg via ORAL
  Filled 2016-12-22 (×3): qty 1

## 2016-12-22 MED ORDER — METOCLOPRAMIDE HCL 5 MG/ML IJ SOLN
10.0000 mg | Freq: Once | INTRAMUSCULAR | Status: AC
  Administered 2016-12-22: 10 mg via INTRAVENOUS
  Filled 2016-12-22 (×2): qty 2

## 2016-12-22 MED ORDER — ACETAMINOPHEN 325 MG PO TABS
650.0000 mg | ORAL_TABLET | Freq: Four times a day (QID) | ORAL | Status: DC | PRN
  Administered 2016-12-23: 650 mg via ORAL
  Filled 2016-12-22: qty 2

## 2016-12-22 MED ORDER — SODIUM CHLORIDE 0.9 % IV SOLN: Freq: Once | INTRAVENOUS | Status: DC

## 2016-12-22 MED ORDER — DOXERCALCIFEROL 4 MCG/2ML IV SOLN
4.0000 ug | INTRAVENOUS | Status: DC
  Administered 2016-12-22: 4 ug via INTRAVENOUS

## 2016-12-22 MED ORDER — ONDANSETRON HCL 4 MG PO TABS: 4.0000 mg | ORAL_TABLET | Freq: Four times a day (QID) | ORAL | Status: DC | PRN

## 2016-12-22 MED ORDER — LIDOCAINE HCL (PF) 1 % IJ SOLN: 5.0000 mL | INTRAMUSCULAR | Status: DC | PRN

## 2016-12-22 MED ORDER — FAMOTIDINE IN NACL 20-0.9 MG/50ML-% IV SOLN
20.0000 mg | Freq: Two times a day (BID) | INTRAVENOUS | Status: DC
  Filled 2016-12-22: qty 50

## 2016-12-22 MED ORDER — ASPIRIN EC 81 MG PO TBEC: 81.0000 mg | DELAYED_RELEASE_TABLET | Freq: Every day | ORAL | Status: DC

## 2016-12-22 MED ORDER — PENTAFLUOROPROP-TETRAFLUOROETH EX AERO: 1.0000 "application " | INHALATION_SPRAY | CUTANEOUS | Status: DC | PRN

## 2016-12-22 MED ORDER — SODIUM CHLORIDE 0.9% FLUSH
3.0000 mL | Freq: Two times a day (BID) | INTRAVENOUS | Status: DC
  Administered 2016-12-22 (×2): 3 mL via INTRAVENOUS

## 2016-12-22 MED ORDER — PEG-KCL-NACL-NASULF-NA ASC-C 100 G PO SOLR
0.5000 | Freq: Once | ORAL | Status: AC
  Administered 2016-12-23: 100 g via ORAL

## 2016-12-22 MED ORDER — ATORVASTATIN CALCIUM 40 MG PO TABS
40.0000 mg | ORAL_TABLET | Freq: Every morning | ORAL | Status: DC
  Administered 2016-12-22 – 2016-12-23 (×2): 40 mg via ORAL
  Filled 2016-12-22 (×2): qty 1

## 2016-12-22 MED ORDER — ACETAMINOPHEN 650 MG RE SUPP: 650.0000 mg | Freq: Four times a day (QID) | RECTAL | Status: DC | PRN

## 2016-12-22 MED ORDER — MOMETASONE FURO-FORMOTEROL FUM 200-5 MCG/ACT IN AERO
2.0000 | INHALATION_SPRAY | Freq: Two times a day (BID) | RESPIRATORY_TRACT | Status: DC
  Administered 2016-12-22 – 2016-12-23 (×2): 2 via RESPIRATORY_TRACT
  Filled 2016-12-22: qty 8.8

## 2016-12-22 MED ORDER — MIDODRINE HCL 5 MG PO TABS: 10.0000 mg | ORAL_TABLET | Freq: Every day | ORAL | Status: DC

## 2016-12-22 MED ORDER — ALBUTEROL SULFATE (2.5 MG/3ML) 0.083% IN NEBU: 2.5000 mg | INHALATION_SOLUTION | RESPIRATORY_TRACT | Status: DC | PRN

## 2016-12-22 MED ORDER — MIDODRINE HCL 5 MG PO TABS: 10.0000 mg | ORAL_TABLET | ORAL | Status: DC

## 2016-12-22 MED ORDER — ONDANSETRON HCL 4 MG/2ML IJ SOLN: 4.0000 mg | Freq: Four times a day (QID) | INTRAMUSCULAR | Status: DC | PRN

## 2016-12-22 MED ORDER — PEG-KCL-NACL-NASULF-NA ASC-C 100 G PO SOLR: 1.0000 | Freq: Once | ORAL | Status: DC

## 2016-12-22 MED ORDER — MIDODRINE HCL 5 MG PO TABS
ORAL_TABLET | ORAL | Status: AC
  Filled 2016-12-22: qty 2

## 2016-12-22 MED ORDER — METOCLOPRAMIDE HCL 5 MG/ML IJ SOLN
10.0000 mg | Freq: Once | INTRAMUSCULAR | Status: AC
  Administered 2016-12-23: 10 mg via INTRAVENOUS
  Filled 2016-12-22 (×2): qty 2

## 2016-12-22 MED ORDER — FAMOTIDINE IN NACL 20-0.9 MG/50ML-% IV SOLN
20.0000 mg | INTRAVENOUS | Status: DC
  Administered 2016-12-22: 20 mg via INTRAVENOUS
  Filled 2016-12-22: qty 50

## 2016-12-22 MED ORDER — PANTOPRAZOLE SODIUM 40 MG PO TBEC
80.0000 mg | DELAYED_RELEASE_TABLET | Freq: Every day | ORAL | Status: DC
  Administered 2016-12-23: 80 mg via ORAL
  Filled 2016-12-22: qty 2

## 2016-12-22 MED ORDER — BISACODYL 5 MG PO TBEC
10.0000 mg | DELAYED_RELEASE_TABLET | Freq: Once | ORAL | Status: AC
  Administered 2016-12-22: 10 mg via ORAL
  Filled 2016-12-22: qty 2

## 2016-12-22 MED ORDER — SODIUM CHLORIDE 0.9 % IV SOLN: 100.0000 mL | INTRAVENOUS | Status: DC | PRN

## 2016-12-22 MED ORDER — LORATADINE 10 MG PO TABS
10.0000 mg | ORAL_TABLET | Freq: Every day | ORAL | Status: DC
  Administered 2016-12-22 – 2016-12-23 (×2): 10 mg via ORAL
  Filled 2016-12-22 (×2): qty 1

## 2016-12-22 MED ORDER — SEVELAMER CARBONATE 2.4 G PO PACK
4.8000 g | PACK | Freq: Two times a day (BID) | ORAL | Status: DC
  Administered 2016-12-22 (×2): 4.8 g via ORAL
  Filled 2016-12-22 (×3): qty 2

## 2016-12-22 MED ORDER — TICAGRELOR 90 MG PO TABS: 90.0000 mg | ORAL_TABLET | Freq: Two times a day (BID) | ORAL | Status: DC

## 2016-12-22 MED ORDER — LIDOCAINE-PRILOCAINE 2.5-2.5 % EX CREA: 1.0000 "application " | TOPICAL_CREAM | CUTANEOUS | Status: DC | PRN

## 2016-12-22 MED ORDER — HEPARIN SODIUM (PORCINE) 1000 UNIT/ML DIALYSIS: 1000.0000 [IU] | INTRAMUSCULAR | Status: DC | PRN

## 2016-12-22 NOTE — Progress Notes (Signed)
FPTS Interim Progress Note  S: Patient states overall she feels well with exception to her cough. Weakness has improved and she no longer feels lightheaded. Denies worsening shortness of breath though this is chronic for her. Denies chest pain, nausea or vomiting, abdominal pain.  O: BP (!) 142/60 (BP Location: Right Arm)   Pulse 60   Temp 98.4 F (36.9 C) (Oral)   Resp (!) 22   Ht 5\' 3"  (1.6 m)   Wt 164 lb (74.4 kg)   SpO2 97%   BMI 29.05 kg/m   General: Well-appearing, talkative, in NAD Eyes: PERRLA, EOMI, conjunctival pallor ENTM: Oropharynx clear, nose normal Neck: No JVD, no cervical lymphadenopathy Cardiovascular: RRR, III/VI systolic murmur heard loudest over the LUSB Respiratory: Normal work of breathing, minimal crackles noted in the lung bases bilaterally Abdomen: +BS, soft, mild diffuse tenderness to palpation, no rebound, no guarding Rectal: Dark stool surrounding the anus, external hemorrhoids present, no rectal masses appreciated, dark brown/black stool present on glove, no bright red bleeding. MSK: 2+ symmetric pitting edema to the mid shin bilaterally, fistula present in left upper extremity Skin: No rashes, no lesions Neuro: Awake, alert, oriented x 3, no focal deficits Psych: Appropriate affect, normal behavior  A/P: #Symptomatic anemia secondary to GI bleed: Acute, stable. Improved s/p 1 pRBC Hgb 5.3>6.8.  --Nephrology to HD today and provide 2 additional units of pRBC --Discussed with cardiology concerning anticoagulation therapy for previous stent placement 11 months prior with recommendations to hold and defer to GI for restarting  #Diastolic CHF: Chronic. Has pitting edema up to midshin bilaterally with bibasilar crackles. --Appears in fluid overload on presentation, monitor --See note above concerning cardiology --Daily weights  --Nephrology to HD today  #ESRD (HD T/T/S): Chronic, needs HD. In fluid overload requiring additional transfusions. --See  above for nephrology  #CAD s/p PCI w/ 4 DE stents (01/2016): Chronic, stable. On Brilenta and aspirin daily. --See note above   Akron Bing, DO 12/22/2016, 9:15 AM PGY-1, [place] [facility] Service pager [phone]

## 2016-12-22 NOTE — Discharge Summary (Signed)
[facility] Discharge Summary  Patient name: [name] [name] Medical record number: [id number] Date of birth: November 21, 1950 Age: 66 y.o. Gender: female Date of Admission: 12/21/2016  Date of Discharge: 12/23/2016 Admitting Physician: Lupita Dawn, MD  Primary Care Provider: Smiley Houseman, MD Consultants: Gastroenterology, Nephrology  Indication for Hospitalization: Synthetic anemia  Discharge Diagnoses/Problem List:  The neck anemia secondary to GI bleed Gastric antral vascular ectasia Diverticulosis ESRD on HD T/T/S HFpEF GERD Hypertension Chronic cough CAD s/p PCI w/ 4 DE stents  Disposition: Home  Discharge Condition: Stable, imporved  Discharge Exam:  General: well nourished, well developed, in no acute distress with non-toxic appearance HEENT: normocephalic, atraumatic, moist mucous membranes Neck: supple, non-tender without lymphadenopathy, no JVD CV: regular rate and rhythm without murmurs, rubs, or gallops, no LE edema Lungs: clear to auscultation bilaterally with normal work of breathing on room air Abdomen: soft, non-tender, no masses or organomegaly palpable, normoactive bowel sounds Skin: warm, dry, no rashes or lesions, cap refill < 2 seconds Extremities: warm and well perfused, normal tone, fistula right upper arm intact w/o erythema  Brief Hospital Course:  [name] [name] a 66 y.o.femalepresenting with GI bleed. PMH is significant for diastolic CHF, ESRD on HD, CAD s/p 4 DES placed in 01/2016, hypertension and hypotension during HD, chronic cough, HLD.  Patient experienced stools with intermittent abdominal pain for 3 weeks duration with associated feelings of weakness and lightheadedness. Patient unable to receive HD Tuesday with subtherapeutic HD on Thursday prior to admission. Patient found to have significant anemia prompting patient to come to [facility] ED.   Upon arrival to ED, hemoglobin is noted 5.6 with hypotension (lowest  86/48) without tachycardia, tachypnea, or fever. Patient overall well-appearing with signs of fluid overload given bilateral lower extremity edema up to mid shins and bibasilar crackles. Rectal exam performed consistent with melanotic stool without obvious hemorrhoids or hematochezia. Patient received 1 unit of pRBC subsequent HD an additional 2 units with hemoglobin of 9.5 s/p transfusion. GI consulted who performed colonoscopy and endoscopy showing diverticulosis along with gastric antral vascular ectasia was treated with cauterization and stabilized without active bleeding. Aspirin and Brilinta discontinued per cardiology recommendation over the phone s/p DE stent x3, 11 months prior. GI recommended restarting aspirin and Brilinta onto 3 days after discharge. Patient was cleared for discharge from a GI perspective with stable vital signs.  Issues for Follow Up:  1. Recheck hemoglobin at PCP follow-up with transfusion goal >9. Patient scheduled for HD T/T/S which will allow patient to be monitored closely. 2. Patient to continue Protonix 40 mg daily. 3. Patient instructed to restart aspirin and Brilinta on 12/26/2016. 4. Gastroenterology treated gastric antral vascular ectasia with cauterization and noted diverticulosis without active bleeding. Did not recommend follow-up and cleared for discharge.  Significant Procedures: Colonoscopy and endoscopy  Significant Labs and Imaging:   Recent Labs Lab 12/22/16 0829 12/22/16 1206 12/23/16 0716  WBC 7.6 7.3 6.5  HGB 6.8* 6.2* 9.5*  HCT 21.3* 19.5* 30.6*  PLT 230 211 215    Recent Labs Lab 12/21/16 1944 12/22/16 0829 12/22/16 1206 12/23/16 1247  NA 129* 133* 135 139  K 3.6 4.3 3.9 3.6  CL 90* 93* 96* 102  CO2 26 22 25 26   GLUCOSE 128* 122* 103* 104*  BUN 48* 58* 54* 20  CREATININE 5.00* 5.57* 5.25* 3.52*  CALCIUM 8.2* 8.7* 8.5* 8.7*  PHOS  --  6.1* 5.5* 4.2  ALKPHOS 80  --   --   --  AST 20  --   --   --   ALT 12*  --   --   --    ALBUMIN 2.7* 2.8* 2.6* 2.7*   --HBsAg: Negative  Imaging/Diagnostic Tests: X-ray chest PA and lateral (12/23/2015) FINDINGS: The lungs are well-expanded. There is patchy density in the left mid lung. The left hemidiaphragm is obscured. A known left pleural effusion has increased slightly in size. The right lung is hyperinflated and clear. The cardiac silhouette remains enlarged. There is calcification in the wall of the aortic arch. The pulmonary vascularity is engorged. The bony thorax exhibits no acute abnormality.  IMPRESSION: COPD with superimposed moderate CHF. Patchy alveolar opacities on the left are not new and may reflect chronic scarring but superimposed pneumonia may be present. Thoracic aortic atherosclerosis.  Results/Tests Pending at Time of Discharge: None  Discharge Medications:  Allergies as of 12/23/2016      Reactions   Aleve [naproxen Sodium] Other (See Comments)   Due to kidneys.   Fentanyl Other (See Comments)   Facial swelling   Motrin [ibuprofen] Other (See Comments)   Due to kidneys.   Nsaids Other (See Comments)   KIDNEY FAILURE AND ONLY HAS PARTIAL LEFT KIDNEY REMAINING (RIGHT ONE TAKEN OUT)   Tape Other (See Comments)   Please use "paper" tape   Contrast Media [iodinated Diagnostic Agents] Itching      Medication List    TAKE these medications   acetaminophen 500 MG tablet Commonly known as:  TYLENOL Take 500 mg by mouth every 6 (six) hours as needed for headache.   albuterol 108 (90 Base) MCG/ACT inhaler Commonly known as:  PROVENTIL HFA;VENTOLIN HFA Inhale 2 puffs into the lungs every 4 (four) hours as needed for wheezing or shortness of breath.   aspirin 81 MG EC tablet Take 1 tablet (81 mg total) by mouth daily. Start taking on:  12/26/2016   atorvastatin 40 MG tablet Commonly known as:  LIPITOR TAKE ONE TABLET BY MOUTH IN THE MORNING   benzonatate 200 MG capsule Commonly known as:  TESSALON Take 1 capsule (200 mg total) by mouth 3  (three) times daily as needed for cough.   calcium carbonate 1250 (500 Ca) MG tablet Commonly known as:  OS-CAL - dosed in mg of elemental calcium Take two (2) tablets by mouth three (3) times daily with meals and take two (2) tablets by mouth with snacks.   DULERA 200-5 MCG/ACT Aero Generic drug:  mometasone-formoterol INHALE TWO PUFFS BY MOUTH TWICE DAILY AT  8  AM  AND  10  PM   loratadine 10 MG tablet Commonly known as:  CLARITIN Take 10 mg by mouth daily.   midodrine 10 MG tablet Commonly known as:  PROAMATINE Take one (1) tablet (10 mg total) by mouth at bedtime on Mondays, Wednesdays, and Fridays. Take one (1) tablet (10 mg total) by mouth pre-dialysis on Tuesdays, Thursdays, and Saturdays.   multivitamin Tabs tablet Take 1 tablet by mouth at bedtime.   nitroGLYCERIN 0.4 MG SL tablet Commonly known as:  NITROSTAT Place 1 tablet (0.4 mg total) under the tongue every 5 (five) minutes as needed for chest pain.   pantoprazole 40 MG tablet Commonly known as:  PROTONIX Take 1 tablet (40 mg total) by mouth 2 (two) times daily.   promethazine 25 MG tablet Commonly known as:  PHENERGAN Take 1 tablet (25 mg total) by mouth every 6 (six) hours as needed for nausea or vomiting.   sevelamer carbonate 2.4  g Pack Commonly known as:  RENVELA Take two (2) packets by mouth with each meal. Twice daily on dialysis days and Three times daily on non-dialysis days.   ticagrelor 90 MG Tabs tablet Commonly known as:  BRILINTA Take 1 tablet (90 mg total) by mouth 2 (two) times daily. Start taking on:  12/26/2016       Discharge Instructions: Please refer to Patient Instructions section of EMR for full details.  Patient was counseled important signs and symptoms that should prompt return to medical care, changes in medications, dietary instructions, activity restrictions, and follow up appointments.   Follow-Up Appointments: Follow-up Information    Dimas Chyle, MD. Go on 12/30/2016.    Specialty:  Family Medicine Why:  Go to hospital follow up appointment at 3:30 PM. Contact information: [address] [address] [place], DO 12/24/2016, 3:49 PM PGY-1, [place]

## 2016-12-22 NOTE — H&P (Signed)
[facility] Admission History and Physical Service Pager: [phone]  Patient name: [name] [name] Medical record number: [id number] Date of birth: Mar 10, 1951 Age: 66 y.o. Gender: female  Primary Care Provider: Smiley Houseman, MD Consultants: GI, Nephrology Code Status: PARTIAL- DNI  Chief Complaint: GI bleed  Assessment and Plan: [name] [name] is a 66 y.o. female presenting with GI bleed. PMH is significant for diastolic CHF, ESRD on HD, CAD s/p 4 DES placed in 01/2016, hypertension and hypotension during HD, chronic cough, HLD.  Symptomatic anemia 2/2 GI bleed: Pt with dark stool and abdominal pain for the last 3 weeks. Subsequently developed weakness, dizziness, and shortness of breath. FOBT positive and dark red stools noted on rectal exam. Hgb noted to be 5.3. More likely to be an upper GI bleed. Differentials include gastritis vs PUD, although Pt does not endorse gnawing epigastric pain. No NSAID use. She had an EGD performed 2014 that showed some gastric mucosal atrophy and erythema.  She could also have some type of gastric or intestinal tumor. Per chart review, Pt has not had a colonoscopy "since she was in her 62s" and her PCP has been trying to refer her for colonoscopy for the last 1.5 years. She could also have a diverticular bleed, but this would be more likely to cause painless bright red bleeding. Hemorrhoids noted on rectal exam, but this would also cause bright red bleeding. - Admit to telemetry under inpatient status, attending Dr. Ree Kida. - Will give 1 unit pRBCs. 2 additional units have been ordered. Need to be very cautious with volume in this patient with ESRD. Can likely hold off until HD to transfuse additional units. - Post-transfusion H/H - Consult GI in the morning. - IV PPI - AM CBC - Tylenol prn for pain, Zofran and Phenergan for nausea  Diastolic CHF: Last ECHO [id number]: EF 45-50%, G2DD. Pt appears fluid overloaded on exam with  crackles in her lung bases L > R and 2+ symmetric lower extremity pitting edema to the mid shin. Normal work of breathing and maintaining O2 saturations on room air. - Daily weights - Obtain CXR given her shortness of breath, focal lung findings, and history of left sided pleural effusion  ESRD: Receives HD T/Th/Sat. - Continue home Renvela, calcium carbonate, Rena-Vit - Consult Nephrology in the morning for HD  Hyponatremia: Acute. Likely in the setting of volume overload. Na 129 on admission, decreased from 142 on 12/16/16. Anticipate that this will likely get better after HD. - Trend sodium   CAD s/p PCI w/ 4 drug eluding stents placed in 01/2016: Pt determined to be a Plavix non-responder. - Continue Brilinta and Aspirin 81mg , as patient is only having a slow GI bleed and I think benefits of continuing dual antiplatelet therapy outweigh the risks. - Will discuss need to continue dual antiplatelet therapy with her cardiologist (Dr. Daneen Schick) in the morning. She is at least 6 months out from her stent placement.  Hypertension with a hx of hypotension: Hypotension worsened on HD days. Pt hypertensive in the ED with BPs up to 182/44, likely in the setting of volume overload. - Continue home Midodrine 10mg  daily, at bedtime on M/W/F and before HD on T/Th/S  Chronic Cough: Has been going on for 2 years. Follows with a Neurosurgeon. May have some underlying lung disease, as she was exposed to heavy smoke by her husband for 40 years. - Continue home Tessalon, Dulera bid, and Albuterol prn  Hyperlipidemia: Well-controlled. Last  lipid panel 09/2015: chol 93, HDL 34, LDL 43, TG 81 - Continue home Lipitor  FEN/GI: Renal diet with fluid restriction Prophylaxis: SCDs, holding anticoagulation in the setting of GI bleed  Disposition: Pending stabilization of hemoglobin, GI work-up, and improvement in volume status. Anticipate home in 2-3 days.  History of Present Illness:  [name] [name] is a  66 y.o. female presenting with GI bleeding. She noticed black stools three weeks ago. She got some preparation H because she has a history of hemorrhoids, but her bleeding didn't get better. On Saturday, she started feeling dizzy. On Sunday she "couldn't do much of anything" because she was feeling so weak. Her weakness and dizziness got much worse today. She has been having shortness of breath "for a while now". She denies any palpitations over the last few days. She is having abdominal pain that "feels like a toothache". The abdominal pain is located in the middle of her stomach. The abdominal pain comes and goes. The pain is worse right before a bowel movement. The pain is better after a bowel movement. Pt states she has fecal incontinence at baseline. She denies any nausea. She endorses some post-tussive emesis and states she has had cough for 2 years. She denies any fevers or chills. No NSAID use.  In the ED, she was hypertensive to 182/44. O2 sats 95-99% on RA. Labs were significant for Na 129, K 3.6, Hgb 5.3. FOBT positive. 3 units of pRBCs were prepared and 1 unit was given. She was admitted for further management.  Review Of Systems: Per HPI with the following additions:  Otherwise the remainder of the systems were negative.  Review of Systems  Constitutional: Negative for chills and fever.  HENT: Negative for congestion and sore throat.   Eyes: Negative for blurred vision and double vision.  Respiratory: Positive for cough and shortness of breath. Negative for sputum production.   Cardiovascular: Positive for leg swelling. Negative for chest pain and palpitations.  Gastrointestinal: Positive for abdominal pain, blood in stool, melena and vomiting. Negative for nausea.  Musculoskeletal: Positive for back pain and joint pain. Negative for falls.  Neurological: Positive for dizziness, weakness and headaches.    Patient Active Problem List   Diagnosis Date Noted  . GI bleed 12/22/2016  .  Cardiomyopathy, ischemic 08/09/2016  . Unstable angina ([place]) 02/04/2016  . CAD (coronary artery disease), native coronary artery 02/03/2016  . Cough 12/11/2015  . Hypoxia 12/03/2015  . Facial cellulitis 12/03/2015  . Facial swelling   . Angioedema 12/02/2015  . Hyperlipidemia   . Diabetes mellitus type 2 in obese ([place]) 05/07/2015  . CAD in native artery 04/25/2015  . Loss of consciousness ([place]) 04/23/2015  . Syncope 04/23/2015  . Fall   . Moderate nonproliferative diabetic retinopathy(362.05) 07/16/2014  . Other malaise and fatigue 06/12/2014  . Memory loss 06/12/2014  . Right knee pain 02/22/2014  . Trouble in sleeping 02/22/2014  . End stage renal disease ([place]) 03/16/2013  . Aftercare following surgery of the circulatory system, [place] 03/16/2013  . Nausea and vomiting 02/14/2013  . Anemia 02/14/2013  . Depression 08/28/2011  . Constipation 04/18/2011  . Low blood pressure 04/18/2011    Past Medical History: Past Medical History:  Diagnosis Date  . Anemia   . Coronary artery disease    a. 6/16 Cath: Severe multivessel dzs->not surg candidate 2/2 debility/poor venous conduits->Med Rx; b. 01/2016 Cath/PCI: LM 50ost, LAD 68m (2.5x32 & 2.5x20 Promus Prem DES'), D1 70ost, D2 90, LCX 95p/m (  3.0x16 Promus Prem DES), 65d, RCA 60p->d, RPDA 95 (2.5x12 Promus Prem DES).  . Diabetes mellitus ([place])   . ESRD (end stage renal disease) on dialysis ([place])       . Hearing difficulty    Mild   . Hyperlipidemia   . Hypertension   . Hyponatremia   . Ischemic cardiomyopathy    a. Echo 04/2015: EF 35%. Cath same time - 45%.  . Memory loss   . Syncope   . Thrombocytopenia ([place])     Past Surgical History: Past Surgical History:  Procedure Laterality Date  . AV FISTULA PLACEMENT Left 03/07/2013   Procedure: ARTERIOVENOUS (AV) FISTULA CREATION;  Surgeon: Elam Dutch, MD;  Location: [facility];  Service: Vascular;  Laterality: Left;  left brachiocephalic AVF  . CARDIAC CATHETERIZATION N/A  04/25/2015   Procedure: Left Heart Cath and Coronary Angiography;  Surgeon: Belva Crome, MD;  Location: [facility] CV [facility];  Service: Cardiovascular;  Laterality: N/A;  . CARDIAC CATHETERIZATION N/A 02/03/2016   Procedure: Coronary Stent Intervention;  Surgeon: Belva Crome, MD;  Location: [facility];  Service: Cardiovascular;  Laterality: N/A;  . CORONARY STENT PLACEMENT  02/03/2016   LAD X2     CIRCUMFLEX       RCA   . ESOPHAGOGASTRODUODENOSCOPY Left 02/20/2013   Procedure: ESOPHAGOGASTRODUODENOSCOPY (EGD);  Surgeon: Cleotis Nipper, MD;  Location: [facility] ENDOSCOPY;  Service: Endoscopy;  Laterality: Left;  . 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 Smoking status: Never Smoker  . Smokeless tobacco: Never Used  . Alcohol use No   Additional social history: Lives at [facility] with her sister. Passive smoke exposure from husband x 40 years who was a heavy smoker. Please also refer to relevant sections of EMR.  Family History: Family History  Problem Relation Age of Onset  . Bone cancer Father     Of jaws   . Throat cancer Father   . Heart attack Father   . Heart disease    . Diabetes    . Stroke    . Heart attack Sister   . Stroke Sister   . Stroke Brother     Allergies and Medications: Allergies  Allergen Reactions  . Aleve [Naproxen Sodium] Other (See Comments)    Due to kidneys.  . Fentanyl Other (See Comments)    Facial swelling   . Motrin [Ibuprofen] Other (See Comments)    Due to kidneys.  . Nsaids Other (See Comments)    KIDNEY FAILURE AND ONLY HAS PARTIAL LEFT KIDNEY REMAINING (RIGHT ONE TAKEN OUT)  . Tape Other (See Comments)    Please use "paper" tape  . Contrast Media [Iodinated Diagnostic Agents] Itching   No current facility-administered medications on file prior to encounter.    Current Outpatient Prescriptions on File Prior to Encounter  Medication Sig Dispense  Refill  . acetaminophen (TYLENOL) 500 MG tablet Take 500 mg by mouth every 6 (six) hours as needed for headache.    . albuterol (PROVENTIL HFA;VENTOLIN HFA) 108 (90 Base) MCG/ACT inhaler Inhale 2 puffs into the lungs every 4 (four) hours as needed for wheezing or shortness of breath. 1 Inhaler 5  . aspirin 81 MG EC tablet Take 1 tablet (81 mg total) by mouth daily. 30 tablet 0  . atorvastatin (LIPITOR) 40 MG tablet TAKE ONE TABLET BY MOUTH IN THE MORNING 30 tablet 2  . benzonatate (  TESSALON) 200 MG capsule Take 1 capsule (200 mg total) by mouth 3 (three) times daily as needed for cough. 90 capsule 1  . calcium carbonate (OS-CAL - DOSED IN MG OF ELEMENTAL CALCIUM) 1250 (500 CA) MG tablet Take two (2) tablets by mouth three (3) times daily with meals and take two (2) tablets by mouth with snacks.    . DULERA 200-5 MCG/ACT AERO INHALE TWO PUFFS BY MOUTH TWICE DAILY AT  8  AM  AND  10  PM 1 Inhaler 1  . loratadine (CLARITIN) 10 MG tablet Take 10 mg by mouth daily.    . midodrine (PROAMATINE) 10 MG tablet Take one (1) tablet (10 mg total) by mouth at bedtime on Mondays, Wednesdays, and Fridays. Take one (1) tablet (10 mg total) by mouth pre-dialysis on Tuesdays, Thursdays, and Saturdays.  3  . multivitamin (RENA-VIT) TABS tablet Take 1 tablet by mouth at bedtime. 90 tablet 3  . nitroGLYCERIN (NITROSTAT) 0.4 MG SL tablet Place 1 tablet (0.4 mg total) under the tongue every 5 (five) minutes as needed for chest pain. 5 tablet 3  . pantoprazole (PROTONIX) 40 MG tablet Take 1 tablet (40 mg total) by mouth 2 (two) times daily. 60 tablet 5  . promethazine (PHENERGAN) 25 MG tablet Take 1 tablet (25 mg total) by mouth every 6 (six) hours as needed for nausea or vomiting. 30 tablet 0  . sevelamer carbonate (RENVELA) 2.4 g PACK Take two (2) packets by mouth with each meal. Twice daily on dialysis days and Three times daily on non-dialysis days.  0  . ticagrelor (BRILINTA) 90 MG TABS tablet Take 1 tablet (90 mg total)  by mouth 2 (two) times daily. 60 tablet 6    Objective: BP (!) 147/44   Pulse 61   Temp 98.4 F (36.9 C) (Oral)   Resp 23   Ht 5\' 3"  (1.6 m)   Wt 164 lb (74.4 kg)   SpO2 95%   BMI 29.05 kg/m  Exam: General: Well-appearing, talkative, in NAD Eyes: PERRLA, EOMI, conjunctival pallor ENTM: Oropharynx clear, nose normal Neck: No JVD, no cervical lymphadenopathy Cardiovascular: RRR, III/VI systolic murmur heard loudest over the LUSB Respiratory: Normal work of breathing, crackles noted in the lung bases bilaterally L > R Abdomen: +BS, soft, mild diffuse tenderness to palpation, no rebound, no guarding Rectal: Dark stool surrounding the anus, external hemorrhoids present, no rectal masses appreciated, dark brown/black stool present on glove, no bright red bleeding. MSK: 2+ symmetric pitting edema to the mid shin bilaterally, fistula present in left upper extremity Skin: No rashes, no lesions Neuro: Awake, alert, oriented x 3, no focal deficits Psych: Appropriate affect, normal behavior  Labs and Imaging: CBC BMET   Recent Labs Lab 12/21/16 1944  WBC 7.8  HGB 5.3*  HCT 16.9*  PLT 209    Recent Labs Lab 12/21/16 1944  NA 129*  K 3.6  CL 90*  CO2 26  BUN 48*  CREATININE 5.00*  GLUCOSE 128*  CALCIUM 8.2*     FOBT positive  Sela Hua, MD 12/22/2016, 2:39 AM PGY-2, [place] Intern pager: [phone], text pages welcome

## 2016-12-22 NOTE — ED Notes (Addendum)
Transfusion start at 0135

## 2016-12-22 NOTE — Procedures (Signed)
  I was present at this dialysis session, have reviewed the session itself and made  appropriate changes Kelly Splinter MD [place] pager [phone]   12/22/2016, 1:42 PM

## 2016-12-22 NOTE — Consult Note (Signed)
[facility] Consult: 9:55 AM 12/22/2016  LOS: 1 day    Referring Provider: Dr. Ree Kida  Primary Care Physician:  Smiley Houseman, MD Primary Gastroenterologist:  Althia Forts. Seen by Sadie [facility]er in 2014 as inpatient only.  Reason for Consultation:  Normocytic anemia.  FOBT positive stool   HPI: [name] [name] is a 66 y.o. female.  PMH severe multi-vessel coronary disease, not an operative candidate, previous DES latest 01/2016. On chronic low-dose aspirin, Brilinta.  Ischemic CM, EF 45 to [id number], grade 2 diastolic dysfx, severe pulmonary htn on 06/2016 Echo.  ESRD on HD.  S/p right nephrectomy due to pyelonephritis.  DM2, on no meds currently, but insulin in past. EGD 02/2013 for N/V, Dr Kalman Shan: No anatomic cause for patient's nausea and vomiting seen. This makes the impression of gastroparesis more likely.  Gastric mucosal atrophy and erythema of uncertain clinical significance, and doubtful relevance to the patient's symptoms.  NM GES 02/2013: Calculated retention in the stomach is 65% at both one and two hours.  Normal retention is less than 30% at 2 hours.  Moderately delayed solid gastric emptying. Remote colonoscopy probably before 2000, no records.  Sigmoid diverticulosis per CT of 02/2013.    In the last 4-6 weeks the patient has seen minor streaks of blood with her stool. About a month ago she started having black, loose stools.  Patient describes previous spells of alternating constipation and nonbloody diarrhea. Appetite is variable, for some days she won't have much of an appetite and then again she may have a good appetite for several days. No nausea or vomiting. No heartburn, no abdominal pain. She denies dysphagia but does say that sometimes it feels like she has cotton balls in the back of her throat and this  leads to vigorous coughing and sometimes she will vomit/regurgitate during a vigorous coughing spell.  She was referred to pulmonologist, Dr. Lamonte Sakai for cough evaluation.  Dr. Lamonte Sakai continued on her inhaled and oral medications, added prn albuterol and Tessalon and increased her Protonix from once to twice daily.  Depending on her clinical progress he was considering future bronchoscopy.   She's had problems with hypotension during dialysis sessions and last week had foreshortened HD times and staff unable to remove sufficient fluid due to the low blood pressures.  She is beginning to feel quite bloated. Weekly Hgb have been trending down in her dialysis unit Hgb 11.1 1/11 Hgb 10.3 1/20 Hgb 9.3 1/25 Hgb 6.5 2/03 Hgb 6.6 2/03  Patient went to dialysis yesterday. After she got home, the dialysis Center called her and told her to come to the emergency room for anemia. Hgb 5.3, to 6.8 post PRBC x 1 and plan for 2 additional PRBCs at HD today.  Hgb was 10.5 on 10/22/16.   Coags normal.  FOBT +.    Last dose of Brilnta and low-dose aspirin was 2/5.  Both are now on hold.  Oral Protonix substituted with IV Pepcid. Patient does not use additional aspirin products or NSAIDs.  No ETOH.    Patient's CBC is closely  followed by nephrology. She is on regular doses of Mircera and weekly Venofer. In fact recently, when hgb was 11 on 12/10/16, Micera dose was decreased.   Her PMD has been contemplating referral to GI for routine colonoscopy, however the patient has had a lot of medical issues, which have bumped colonoscopy to the back burner.      Past Medical History:  Diagnosis Date  . Anemia   . Coronary artery disease    a. 6/16 Cath: Severe multivessel dzs->not surg candidate 2/2 debility/poor venous conduits->Med Rx; b. 01/2016 Cath/PCI: LM 50ost, LAD 25m (2.5x32 & 2.5x20 Promus Prem DES'), D1 70ost, D2 90, LCX 95p/m (3.0x16 Promus Prem DES), 65d, RCA 60p->d, RPDA 95 (2.5x12 Promus Prem DES).  . Diabetes  mellitus ([place])   . ESRD (end stage renal disease) on dialysis ([place])       . Hearing difficulty    Mild   . Hyperlipidemia   . Hypertension   . Hyponatremia   . Ischemic cardiomyopathy    a. Echo 04/2015: EF 35%. Cath same time - 45%.  . Memory loss   . Syncope   . Thrombocytopenia ([place])     Past Surgical History:  Procedure Laterality Date  . AV FISTULA PLACEMENT Left 03/07/2013   Procedure: ARTERIOVENOUS (AV) FISTULA CREATION;  Surgeon: Elam Dutch, MD;  Location: [place];  Service: Vascular;  Laterality: Left;  left brachiocephalic AVF  . CARDIAC CATHETERIZATION N/A 04/25/2015   Procedure: Left Heart Cath and Coronary Angiography;  Surgeon: Belva Crome, MD;  Location: [place] [facility];  Service: Cardiovascular;  Laterality: N/A;  . CARDIAC CATHETERIZATION N/A 02/03/2016   Procedure: Coronary Stent Intervention;  Surgeon: Belva Crome, MD;  Location: [place] [facility];  Service: Cardiovascular;  Laterality: N/A;  . CORONARY STENT PLACEMENT  02/03/2016   LAD X2     CIRCUMFLEX       RCA   . ESOPHAGOGASTRODUODENOSCOPY Left 02/20/2013   Procedure: ESOPHAGOGASTRODUODENOSCOPY (EGD);  Surgeon: Cleotis Nipper, MD;  Location[facility] ENDOSCOPY;  Service: Endoscopy;  Laterality: Left;  . 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 Authorizing Provider  acetaminophen (TYLENOL) 500 MG tablet Take 500 mg by mouth every 6 (six) hours as needed for headache.   Yes Historical Provider, MD  albuterol (PROVENTIL HFA;VENTOLIN HFA) 108 (90 Base) MCG/ACT inhaler Inhale 2 puffs into the lungs every 4 (four) hours as needed for wheezing or shortness of breath. 12/09/16  Yes Collene Gobble, MD  aspirin 81 MG EC tablet Take 1 tablet (81 mg total) by mouth daily. 03/18/16   Smiley Houseman, MD  atorvastatin (LIPITOR) 40 MG tablet TAKE ONE TABLET BY MOUTH IN THE MORNING 07/17/16   Smiley Houseman, MD    benzonatate (TESSALON) 200 MG capsule Take 1 capsule (200 mg total) by mouth 3 (three) times daily as needed for cough. 12/09/16   Collene Gobble, MD  calcium carbonate (OS-CAL - DOSED IN MG OF ELEMENTAL CALCIUM) 1250 (500 CA) MG tablet Take two (2) tablets by mouth three (3) times daily with meals and take two (2) tablets by mouth with snacks.    Historical Provider, MD  DULERA 200-5 MCG/ACT AERO INHALE TWO PUFFS BY MOUTH TWICE DAILY AT  8  AM  AND  10  PM 12/11/16   Smiley Houseman, MD  loratadine (CLARITIN) 10 MG tablet Take 10  mg by mouth daily.    Historical Provider, MD  midodrine (PROAMATINE) 10 MG tablet Take one (1) tablet (10 mg total) by mouth at bedtime on Mondays, Wednesdays, and Fridays. Take one (1) tablet (10 mg total) by mouth pre-dialysis on Tuesdays, Thursdays, and Saturdays. 11/18/15   Historical Provider, MD  multivitamin (RENA-VIT) TABS tablet Take 1 tablet by mouth at bedtime. 09/04/14   Leone Haven, MD  nitroGLYCERIN (NITROSTAT) 0.4 MG SL tablet Place 1 tablet (0.4 mg total) under the tongue every 5 (five) minutes as needed for chest pain. 03/18/16   Smiley Houseman, MD  pantoprazole (PROTONIX) 40 MG tablet Take 1 tablet (40 mg total) by mouth 2 (two) times daily. 12/09/16   Collene Gobble, MD  promethazine (PHENERGAN) 25 MG tablet Take 1 tablet (25 mg total) by mouth every 6 (six) hours as needed for nausea or vomiting. 03/18/16   Smiley Houseman, MD  sevelamer carbonate (RENVELA) 2.4 g PACK Take two (2) packets by mouth with each meal. Twice daily on dialysis days and Three times daily on non-dialysis days. 05/18/16   Historical Provider, MD  ticagrelor (BRILINTA) 90 MG TABS tablet Take 1 tablet (90 mg total) by mouth 2 (two) times daily. 03/18/16   Smiley Houseman, MD    Scheduled Meds: . atorvastatin  40 mg Oral q morning - 10a  . calcium carbonate  2 tablet Oral BID WC  . Chlorhexidine Gluconate Cloth  6 each Topical Q0600  . famotidine (PEPCID) IV  20 mg  Intravenous Q24H  . loratadine  10 mg Oral Daily  . [START ON 12/23/2016] midodrine  10 mg Oral Once per day on Mon Wed Fri   And  . midodrine  10 mg Oral Q T,Th,Sa-HD  . mometasone-formoterol  2 puff Inhalation BID  . multivitamin  1 tablet Oral QHS  . mupirocin ointment  1 application Nasal BID  . sevelamer carbonate  4.8 g Oral BID WC  . sodium chloride flush  3 mL Intravenous Q12H   Infusions:  PRN Meds: acetaminophen **OR** acetaminophen, albuterol, benzonatate, ondansetron **OR** ondansetron (ZOFRAN) IV, promethazine   Allergies as of 12/21/2016 - Review Complete 12/21/2016  Allergen Reaction Noted  . Aleve [naproxen sodium] Other (See Comments) 02/13/2013  . Fentanyl Other (See Comments) 02/04/2016  . Motrin [ibuprofen] Other (See Comments) 02/13/2013  . Nsaids Other (See Comments) 09/16/2015  . Tape Other (See Comments) 02/03/2016  . Contrast media [iodinated diagnostic agents] Itching 02/04/2016    Family History  Problem Relation Age of Onset  . Bone cancer Father     Of jaws   . Throat cancer Father   . Heart attack Father   . Heart disease    . Diabetes    . Stroke    . Heart attack Sister   . Stroke Sister   . Stroke Brother     Social History   Social History  . Marital status: Widowed    Spouse name: N/A  . Number of children: N/A  . Years of education: N/A   Occupational History  . Not on file.   Social History Main Topics  . Smoking status: Never Smoker  . Smokeless tobacco: Never Used  . Alcohol use No  . Drug use: No  . Sexual activity: Not Currently    Birth control/ protection: Abstinence   Other Topics Concern  . Not on file   Social History Narrative   Lives with husband Talan Cartwright (born [birth year]).  Denies alcohol, tobacco (never smoked), other drugs   Hobbies: reading          REVIEW OF SYSTEMS: Constitutional:  No change in stable weakness and fatigue. ENT:  No nose bleeds Pulm:  Some shortness of breath with heavier  exertion such as climbing multiple stairs, however she generally doesn't exert herself much at all. CV:  No chest pain, no palpitations. GU:  No hematuria, no frequency GI:  Per HPI Heme:  No excessive bleeding or bruising   Transfusions:  Does not recall previous transfusions. Neuro:  No falls, religious in her use of a walker at home. Derm:  No itching, no rash or sores.  Endocrine:  Patient says that after she lost 80 pounds a few years back, she no longer required diabetic medications. Immunization:  Did not inquire as to recent vaccinations. Travel:  None beyond local counties in last few months.    PHYSICAL EXAM: Vital signs in last 24 hours: Vitals:   12/22/16 0150 12/22/16 0404  BP:  (!) 142/60  Pulse:  60  Resp:  (!) 22  Temp: 98.4 F (36.9 C) 98.4 F (36.9 C)   Wt Readings from Last 3 Encounters:  12/21/16 74.4 kg (164 lb)  12/09/16 68.5 kg (151 lb)  10/22/16 67.1 kg (148 lb)   General: Chronically ill appearing, alert, comfortable elderly WF. Head:  No facial asymmetry or swelling, no signs of head trauma.  Eyes:  No conjunctival pallor. EOMI. Slight lid lag on the left. Ears:  HOH, did not bring her hearing aids with her.  Nose:  No discharge or congestion Mouth:  Tongue midline. Poor dentition with many missing teeth. Mucosa pink, clear and somewhat dry. Neck:  No masses, no thyromegaly. Lungs:  Clear bilaterally. Some dyspnea with prolonged speaking. Infrequent dry cough. Heart: RRR. No MRG. S1, S2 present. Abdomen:  Soft. Nontender. No distention. Bowel sounds active. No bruits. No masses or HSM.Marland Kitchen   Rectal: Pastelike black stool which is rapidly 3+ FOBT +.   Musc/Skeltl: No gross joint swelling or redness. Some spinal kyphosis. Extremities:  2 plus edema in the feet and pretibial region bilaterally. No bulging AV fistula with palpable thrill on left arm Neurologic:  Alert, oriented times 3. No tremor. Moves all 4 limbs, strength not tested. Skin:  Extensive  vitiligo on face, trunk, limbs. Tattoos:  None Nodes:  No cervical adenopathy   Psych:  Pleasant, talkative. Slightly anxious.  Intake/Output from previous day: 02/05 0701 - 02/06 0700 In: 335 [Blood:335] Out: -  Intake/Output this shift: No intake/output data recorded.  LAB RESULTS:  Recent Labs  12/21/16 1944 12/22/16 0829  WBC 7.8 7.6  HGB 5.3* 6.8*  HCT 16.9* 21.3*  PLT 209 230   BMET Lab Results  Component Value Date   NA 133 (L) 12/22/2016   NA 129 (L) 12/21/2016   NA 142 12/16/2016   K 4.3 12/22/2016   K 3.6 12/21/2016   K 3.5 12/16/2016   CL 93 (L) 12/22/2016   CL 90 (L) 12/21/2016   CL 98 12/16/2016   CO2 22 12/22/2016   CO2 26 12/21/2016   CO2 32 (H) 12/16/2016   GLUCOSE 122 (H) 12/22/2016   GLUCOSE 128 (H) 12/21/2016   GLUCOSE 144 (H) 12/16/2016   BUN 58 (H) 12/22/2016   BUN 48 (H) 12/21/2016   BUN 37 (H) 12/16/2016   CREATININE 5.57 (H) 12/22/2016   CREATININE 5.00 (H) 12/21/2016   CREATININE 3.56 (H) 12/16/2016   CALCIUM 8.7 (  L) 12/22/2016   CALCIUM 8.2 (L) 12/21/2016   CALCIUM 8.0 (L) 12/16/2016   LFT  Recent Labs  12/21/16 1944 12/22/16 0829  PROT 5.2*  --   ALBUMIN 2.7* 2.8*  AST 20  --   ALT 12*  --   ALKPHOS 80  --   BILITOT 0.6  --    PT/INR Lab Results  Component Value Date   INR 1.08 12/21/2016   INR 1.10 01/30/2016   INR 1.14 04/25/2015   Hepatitis Panel No results for input(s): HEPBSAG, HCVAB, HEPAIGM, HEPBIGM in the last 72 hours. C-Diff No components found for: CDIFF Lipase     Component Value Date/Time   LIPASE 17 09/16/2015 1355    Drugs of Abuse     Component Value Date/Time   LABOPIA NONE DETECTED 02/14/2013 0832   COCAINSCRNUR NONE DETECTED 02/14/2013 0832   LABBENZ NONE DETECTED 02/14/2013 0832   AMPHETMU NONE DETECTED 02/14/2013 0832   THCU NONE DETECTED 02/14/2013 0832   LABBARB NONE DETECTED 02/14/2013 [id number]     RADIOLOGY STUDIES: X-ray Chest Pa And Lateral  Result Date: 12/22/2016 CLINICAL  DATA:  Shortness of breath, on dialysis, history of coronary artery disease and ischemic cardiomyopathy. Diabetes and hypertension EXAM: CHEST  2 VIEW COMPARISON:  Frontal x-ray of November 30, 2016 FINDINGS: The lungs are well-expanded. There is patchy density in the left mid lung. The left hemidiaphragm is obscured. A known left pleural effusion has increased slightly in size. The right lung is hyperinflated and clear. The cardiac silhouette remains enlarged. There is calcification in the wall of the aortic arch. The pulmonary vascularity is engorged. The bony thorax exhibits no acute abnormality. IMPRESSION: COPD with superimposed moderate CHF. Patchy alveolar opacities on the left are not new and may reflect chronic scarring but superimposed pneumonia may be present. Thoracic aortic atherosclerosis. Electronically Signed   By: David  Martinique M.D.   On: 12/22/2016 07:41    IMPRESSION:   *   Acute on chronic anemia, in setting of GI bleeding.  She takes chronic Protonix and recently the dose was doubled so likelihood of peptic ulcer disease is low. Question AVMs, question neoplasia. On chronic Mircera and Venofer.   *  Diabetic gastroparesis on nuc med GES in 2014. After losing close to 80 pounds, her diabetes went "into remission"  and she does not describe any symptoms consistent with gastroparesis currently.  *  ESRD.  TTS hemodialysis. Recently hypotension has limited Nephrology's ability to pull fluid off during HD sessions and she is clinically volume overloaded.   *  Chronic cough.  Occasionally has regurgitation with especially vigorous coughing.  Dr. Lamonte Sakai follows. CXR with COPD, CHF, ? PNA.        PLAN:     *  Switch back to Protonix BID.  Clears.      Azucena Freed  12/22/2016, 9:55 AM Pager: [phone]    ________________________________________________________________________  Velora Heckler [facility] MD note:  I personally examined the patient, reviewed the data and agree with the  assessment and plan described above.  She has had significant drop in Hb over past couple months with intermittent dark stools.  EGD 3-4 years ago and colonoscopy almost 20 years ago.  I recommended that we workup the anemia, bleeding with a colonoscopy +/- EGD tomorrow.  She understands and agrees.   Owens Loffler, MD [facility] Pager [phone]

## 2016-12-22 NOTE — Consult Note (Signed)
[facility] Renal Consultation Note    Indication for Consultation:  Management of ESRD/hemodialysis; anemia, hypertension/volume and secondary hyperparathyroidism PCP: Smiley Houseman, MD Referring physician: Dr. Ree Kida   HPI: [name] [name] is a 66 y.o. female with ESRD (TTS NW) presumed secondary to DM on HD since April 2014 who is s/p right nephrectomy for emphysematous pyelonephritis in 2012, hx severe multivessel CAD s/p PTCI and stent of multiple vessels in [place] 2017, ischm CM EF 45-50% August 2017, hx thoracentesis 06/2016 and again 11/30/16 for recurrent right pleural effusion.   Weekly Hgb have been trending down in her dialysis unit Hgb 11.1 1/11 Hgb 10.3 1/20 Hgb 9.3 1/25 Hgb 6.5 2/03 Hgb 6.6 2/03  Yesterday, patient was called with the results of her Saturday repeat hgb and advised to go to the ED for evaluation.  At best, she gets within 1-2 kg of EDW with variable UF goals, but since 1/30 she has been leaving significantly above EDWdue to low BP during tmt - her last HD was 2/3 and post HD wt  was 71.1 (EDW 66.5) and net UF of only 1.8 post HD BP 107/47 sitting and 114/44 standing P 59.  Evaluation in the ED showed hgb 5.3, K 3.6 BUN 48 Cr 5 LFTs ok CXR showed CHF BP variable - mostly elevated - more than usual for her.She generally takes midodrine for BP support 10 mg at HS and 10 mg pre HD.  Historically, she reports some streaks of blood in stool and about a month ago started having loose black stools and alternating diarrhea and constipation. Her binders are nonFe containing.  She has had problems with chronic cough and is followed by pulmonary. Appetite is variable. She has no N, V, heartburn or abdominal pain. She might vomiting if she has a coughing spell.  At present she is hungry and fussing about not being able to eat regular food.  Last thoracentesis was 1/15 18 - 0.6 L on the left)  She resides at [facility] with her sister in the ALF.   Past  Medical History:  Diagnosis Date  . Anemia   . Coronary artery disease    a. 6/16 Cath: Severe multivessel dzs->not surg candidate 2/2 debility/poor venous conduits->Med Rx; b. 01/2016 Cath/PCI: LM 50ost, LAD 38m (2.5x32 & 2.5x20 Promus Prem DES'), D1 70ost, D2 90, LCX 95p/m (3.0x16 Promus Prem DES), 65d, RCA 60p->d, RPDA 95 (2.5x12 Promus Prem DES).  . Diabetes mellitus ([place])   . ESRD (end stage renal disease) on dialysis ([place])       . Hearing difficulty    Mild   . Hyperlipidemia   . Hypertension   . Hyponatremia   . Ischemic cardiomyopathy    a. Echo 04/2015: EF 35%. Cath same time - 45%.  . Memory loss   . Syncope   . Thrombocytopenia ([place])    Past Surgical History:  Procedure Laterality Date  . AV FISTULA PLACEMENT Left 03/07/2013   Procedure: ARTERIOVENOUS (AV) FISTULA CREATION;  Surgeon: Elam Dutch, MD;  Location: [facility];  Service: Vascular;  Laterality: Left;  left brachiocephalic AVF  . CARDIAC CATHETERIZATION N/A 04/25/2015   Procedure: Left Heart Cath and Coronary Angiography;  Surgeon: Belva Crome, MD;  Location: [facility];  Service: Cardiovascular;  Laterality: N/A;  . CARDIAC CATHETERIZATION N/A 02/03/2016   Procedure: Coronary Stent Intervention;  Surgeon: Belva Crome, MD;  Location: [facility];  Service: Cardiovascular;  Laterality: N/A;  .  CORONARY STENT PLACEMENT  02/03/2016   LAD X2     CIRCUMFLEX       RCA   . ESOPHAGOGASTRODUODENOSCOPY Left 02/20/2013   Procedure: ESOPHAGOGASTRODUODENOSCOPY (EGD);  Surgeon: Cleotis Nipper, MD;  Location: [facility] ENDOSCOPY;  Service: Endoscopy;  Laterality: Left;  . 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 Bone cancer Father     Of jaws   . Throat cancer Father   . Heart attack Father   . Heart disease    . Diabetes    . Stroke    . Heart attack Sister   . Stroke Sister   . Stroke Brother    Social History:  reports that she has  never smoked. She has never used smokeless tobacco. She reports that she does not drink alcohol or use drugs. Allergies  Allergen Reactions  . Aleve [Naproxen Sodium] Other (See Comments)    Due to kidneys.  . Fentanyl Other (See Comments)    Facial swelling   . Motrin [Ibuprofen] Other (See Comments)    Due to kidneys.  . Nsaids Other (See Comments)    KIDNEY FAILURE AND ONLY HAS PARTIAL LEFT KIDNEY REMAINING (RIGHT ONE TAKEN OUT)  . Tape Other (See Comments)    Please use "paper" tape  . Contrast Media [Iodinated Diagnostic Agents] Itching   Prior to Admission medications   Medication Sig Start Date End Date Taking? Authorizing Provider  acetaminophen (TYLENOL) 500 MG tablet Take 500 mg by mouth every 6 (six) hours as needed for headache.   Yes Historical Provider, MD  albuterol (PROVENTIL HFA;VENTOLIN HFA) 108 (90 Base) MCG/ACT inhaler Inhale 2 puffs into the lungs every 4 (four) hours as needed for wheezing or shortness of breath. 12/09/16  Yes Collene Gobble, MD  aspirin 81 MG EC tablet Take 1 tablet (81 mg total) by mouth daily. 03/18/16   Smiley Houseman, MD  atorvastatin (LIPITOR) 40 MG tablet TAKE ONE TABLET BY MOUTH IN THE MORNING 07/17/16   Smiley Houseman, MD  benzonatate (TESSALON) 200 MG capsule Take 1 capsule (200 mg total) by mouth 3 (three) times daily as needed for cough. 12/09/16   Collene Gobble, MD  calcium carbonate (OS-CAL - DOSED IN MG OF ELEMENTAL CALCIUM) 1250 (500 CA) MG tablet Take two (2) tablets by mouth three (3) times daily with meals and take two (2) tablets by mouth with snacks.    Historical Provider, MD  DULERA 200-5 MCG/ACT AERO INHALE TWO PUFFS BY MOUTH TWICE DAILY AT  8  AM  AND  10  PM 12/11/16   Smiley Houseman, MD  loratadine (CLARITIN) 10 MG tablet Take 10 mg by mouth daily.    Historical Provider, MD  midodrine (PROAMATINE) 10 MG tablet Take one (1) tablet (10 mg total) by mouth at bedtime on Mondays, Wednesdays, and Fridays. Take one (1)  tablet (10 mg total) by mouth pre-dialysis on Tuesdays, Thursdays, and Saturdays. 11/18/15   Historical Provider, MD  multivitamin (RENA-VIT) TABS tablet Take 1 tablet by mouth at bedtime. 09/04/14   Leone Haven, MD  nitroGLYCERIN (NITROSTAT) 0.4 MG SL tablet Place 1 tablet (0.4 mg total) under the tongue every 5 (five) minutes as needed for chest pain. 03/18/16   Smiley Houseman, MD  pantoprazole (PROTONIX) 40 MG tablet Take 1 tablet (40 mg total) by mouth 2 (two) times daily. 12/09/16   Collene Gobble, MD  promethazine (PHENERGAN) 25 MG tablet Take 1 tablet (25 mg total) by mouth every 6 (six) hours as needed for nausea or vomiting. 03/18/16   Smiley Houseman, MD  sevelamer carbonate (RENVELA) 2.4 g PACK Take two (2) packets by mouth with each meal. Twice daily on dialysis days and Three times daily on non-dialysis days. 05/18/16   Historical Provider, MD  ticagrelor (BRILINTA) 90 MG TABS tablet Take 1 tablet (90 mg total) by mouth 2 (two) times daily. 03/18/16   Smiley Houseman, MD   Current Facility-Administered Medications  Medication Dose Route Frequency Provider Last Rate Last Dose  . acetaminophen (TYLENOL) tablet 650 mg  650 mg Oral Q6H PRN Sela Hua, MD       Or  . acetaminophen (TYLENOL) suppository 650 mg  650 mg Rectal Q6H PRN Sela Hua, MD      . albuterol (PROVENTIL) (2.5 MG/3ML) 0.083% nebulizer solution 2.5 mg  2.5 mg Inhalation Q4H PRN Sela Hua, MD      . atorvastatin (LIPITOR) tablet 40 mg  40 mg Oral q morning - 10a Sela Hua, MD      . benzonatate (TESSALON) capsule 200 mg  200 mg Oral TID PRN Sela Hua, MD      . calcium carbonate (OS-CAL - dosed in mg of elemental calcium) tablet 1,000 mg of elemental calcium  2 tablet Oral BID WC Sela Hua, MD      . Chlorhexidine Gluconate Cloth 2 % PADS 6 each  6 each Topical Q0600 Lupita Dawn, MD      . famotidine (PEPCID) IVPB 20 mg premix  20 mg Intravenous Q24H Lupita Dawn, MD      .  loratadine (CLARITIN) tablet 10 mg  10 mg Oral Daily Sela Hua, MD      . Derrill Memo ON 12/23/2016] midodrine (PROAMATINE) tablet 10 mg  10 mg Oral Once per day on Mon Wed Fri Lupita Dawn, MD       And  . midodrine (PROAMATINE) tablet 10 mg  10 mg Oral Q T,Th,Sa-HD Lupita Dawn, MD      . mometasone-formoterol [facility]) 200-5 MCG/ACT inhaler 2 puff  2 puff Inhalation BID Sela Hua, MD      . multivitamin (RENA-VIT) tablet 1 tablet  1 tablet Oral QHS Sela Hua, MD      . mupirocin ointment (BACTROBAN) 2 % 1 application  1 application Nasal BID Lupita Dawn, MD      . ondansetron [facility]) tablet 4 mg  4 mg Oral Q6H PRN Sela Hua, MD       Or  . ondansetron [facility]) injection 4 mg  4 mg Intravenous Q6H PRN Sela Hua, MD      . promethazine (PHENERGAN) tablet 25 mg  25 mg Oral Q6H PRN Sela Hua, MD      . sevelamer carbonate (RENVELA) powder PACK 4.8 g  4.8 g Oral BID WC Sela Hua, MD   4.8 g at 12/22/16 0813  . sodium chloride flush (NS) 0.9 % injection 3 mL  3 mL Intravenous Q12H Sela Hua, MD       Labs: Basic Metabolic Panel:  Recent Labs Lab 12/16/16 1002 12/21/16 1944 12/22/16 0829  NA 142 129* 133*  K 3.5 3.6 4.3  CL 98 90* 93*  CO2 32* 26 22  GLUCOSE 144* 128* 122*  BUN 37* 48* 58*  CREATININE 3.56* 5.00* 5.57*  CALCIUM 8.0* 8.2* 8.7*  PHOS  --   --  6.1*   Liver Function Tests:  Recent Labs Lab 12/16/16 1002 12/21/16 1944 12/22/16 0829  AST 22 20  --   ALT 12 12*  --   ALKPHOS 87 80  --   BILITOT 0.5 0.6  --   PROT 5.4* 5.2*  --   ALBUMIN 3.0* 2.7* 2.8*   No results for input(s): LIPASE, AMYLASE in the last 168 hours. No results for input(s): AMMONIA in the last 168 hours. CBC:  Recent Labs Lab 12/21/16 1944 12/22/16 0829  WBC 7.8 7.6  NEUTROABS 6.1  --   HGB 5.3* 6.8*  HCT 16.9* 21.3*  MCV 97.1 95.1  PLT 209 230  Studies/Results: X-ray Chest Pa And Lateral  Result Date: 12/22/2016 CLINICAL DATA:  Shortness of  breath, on dialysis, history of coronary artery disease and ischemic cardiomyopathy. Diabetes and hypertension EXAM: CHEST  2 VIEW COMPARISON:  Frontal x-ray of November 30, 2016 FINDINGS: The lungs are well-expanded. There is patchy density in the left mid lung. The left hemidiaphragm is obscured. A known left pleural effusion has increased slightly in size. The right lung is hyperinflated and clear. The cardiac silhouette remains enlarged. There is calcification in the wall of the aortic arch. The pulmonary vascularity is engorged. The bony thorax exhibits no acute abnormality. IMPRESSION: COPD with superimposed moderate CHF. Patchy alveolar opacities on the left are not new and may reflect chronic scarring but superimposed pneumonia may be present. Thoracic aortic atherosclerosis. Electronically Signed   By: David  Martinique M.D.   On: 12/22/2016 07:41    ROS: As per HPI otherwise negative.  Physical Exam: Vitals:   12/22/16 0140 12/22/16 0145 12/22/16 0150 12/22/16 0404  BP: 174/61 (!) 147/44  (!) 142/60  Pulse: 60 61  60  Resp: 13 23  (!) 22  Temp:   98.4 F (36.9 C) 98.4 F (36.9 C)  TempSrc:   Oral Oral  SpO2: 95% 95%  97%  Weight:      Height:         General: elderly pale WF NAD on room air complaining about being limited to CL Head: NCAT sclera not icteric MMM; HOH (left hearing aids at home) Neck: Supple.  Lungs: dim bases- poor expansion crackles at bases. Breathing is unlabored.coughs with deep inspiration Heart: RRR with S1 S2.  Abdomen: soft NT + BS Lower extremities:2 +without edema  Neuro: A & O  X 3. Moves all extremities spontaneously. Psych:  Responds to questions appropriately with a normal affect. Dialysis Access:left upper AVF + bruit  Dialysis Orders:  NW TTS $ hr 180 EDW 66.5 2 K 2 Ca NO heparin 425/A1.25 left upper AVF hectorol 4 venofer 50 per week Mircera 150 1/25 last dose (225 prior to that 1/11 iPTH 82 Ca ok P 6-7 hgb 6.6 2/3 24% sat     Assessment/Plan: 1. ABLA secondary to GIB  hgb 5.3 heme +-s/p 1 unit PRBC transfuse another unit PRBC on HD; has been on no heparin HD - work up per GI- repeat CBC pre HD - if hgb M< 7.5 will give two more units; Redose Aranesp on Thursday per schedule - increase weekly Fe to 125 per week on Thursday 2. ESRD -  TTS - HD today K 4.3 - use 2 K bath 3. BP/volume  -sig above EDW, titrate down may take several HD treatments but should be easier once hgb is up- on midodrine for BP support- ^  BP due to volume 4. Severe multivessel CAD/ISCM - keep hgb >9- on asa and Brilinta (now on hold) 5. Metabolic bone disease -  Prone to ^ P -continue hectorol and binders 6. Nutrition - CL due to GI eval 7. DM - per primary 8. COPD - usual pul meds - sats ok on room air  Myriam Jacobson, PA-C [place] [phone] 12/22/2016, 10:34 AM   Pt seen, examined and agree w A/P as above. ESRD pt w/ anemia likely GI bleed, also appears to have vol overload w LE edema and abnormal CXR.  Plan HD today w transfusion and lower volume as tolerated.  Kelly Splinter MD [employer] pager [phone]   12/22/2016, 1:41 PM

## 2016-12-23 ENCOUNTER — Inpatient Hospital Stay (HOSPITAL_COMMUNITY): Payer: Medicare Other | Admitting: Certified Registered"

## 2016-12-23 ENCOUNTER — Ambulatory Visit: Payer: Medicare Other | Admitting: Family Medicine

## 2016-12-23 ENCOUNTER — Encounter (HOSPITAL_COMMUNITY)
Admission: EM | Disposition: A | Discharge status: Home / Self Care | Payer: Self-pay | Source: Home / Self Care | Attending: Family Medicine

## 2016-12-23 ENCOUNTER — Encounter (HOSPITAL_COMMUNITY): Payer: Self-pay | Admitting: Certified Registered"

## 2016-12-23 DIAGNOSIS — K31819 Angiodysplasia of stomach and duodenum without bleeding: Secondary | ICD-10-CM

## 2016-12-23 DIAGNOSIS — D62 Acute posthemorrhagic anemia: Secondary | ICD-10-CM

## 2016-12-23 DIAGNOSIS — K5730 Diverticulosis of large intestine without perforation or abscess without bleeding: Secondary | ICD-10-CM

## 2016-12-23 DIAGNOSIS — R195 Other fecal abnormalities: Secondary | ICD-10-CM

## 2016-12-23 HISTORY — PX: ESOPHAGOGASTRODUODENOSCOPY (EGD) WITH PROPOFOL: SHX5813

## 2016-12-23 HISTORY — PX: COLONOSCOPY WITH PROPOFOL: SHX5780

## 2016-12-23 LAB — RENAL FUNCTION PANEL
ALBUMIN: 2.7 g/dL — AB (ref 3.5–5.0)
ANION GAP: 11 (ref 5–15)
BUN: 20 mg/dL (ref 6–20)
CALCIUM: 8.7 mg/dL — AB (ref 8.9–10.3)
CO2: 26 mmol/L (ref 22–32)
Chloride: 102 mmol/L (ref 101–111)
Creatinine, Ser: 3.52 mg/dL — ABNORMAL HIGH (ref 0.44–1.00)
GFR calc non Af Amer: 12 mL/min — ABNORMAL LOW (ref >60)
GFR, EST AFRICAN AMERICAN: 14 mL/min — AB (ref >60)
Glucose, Bld: 104 mg/dL — ABNORMAL HIGH (ref 65–99)
PHOSPHORUS: 4.2 mg/dL (ref 2.5–4.6)
Potassium: 3.6 mmol/L (ref 3.5–5.1)
Sodium: 139 mmol/L (ref 135–145)

## 2016-12-23 LAB — HEPATITIS B SURFACE ANTIGEN: HEP B S AG: NEGATIVE

## 2016-12-23 LAB — CBC
HCT: 30.6 % — ABNORMAL LOW (ref 36.0–46.0)
Hemoglobin: 9.5 g/dL — ABNORMAL LOW (ref 12.0–15.0)
MCH: 29.2 pg (ref 26.0–34.0)
MCHC: 31 g/dL (ref 30.0–36.0)
MCV: 94.2 fL (ref 78.0–100.0)
Platelets: 215 10*3/uL (ref 150–400)
RBC: 3.25 MIL/uL — AB (ref 3.87–5.11)
RDW: 19.9 % — ABNORMAL HIGH (ref 11.5–15.5)
WBC: 6.5 10*3/uL (ref 4.0–10.5)

## 2016-12-23 SURGERY — COLONOSCOPY WITH PROPOFOL
Anesthesia: General

## 2016-12-23 MED ORDER — NEPRO/CARBSTEADY PO LIQD: 237.0000 mL | ORAL | Status: DC

## 2016-12-23 MED ORDER — TICAGRELOR 90 MG PO TABS: 90.0000 mg | ORAL_TABLET | Freq: Two times a day (BID) | ORAL | 0 refills | Status: DC

## 2016-12-23 MED ORDER — GLUCAGON HCL RDNA (DIAGNOSTIC) 1 MG IJ SOLR
INTRAMUSCULAR | Status: DC | PRN
  Administered 2016-12-23: .5 mg via INTRAVENOUS

## 2016-12-23 MED ORDER — LIDOCAINE 2% (20 MG/ML) 5 ML SYRINGE
INTRAMUSCULAR | Status: DC | PRN
  Administered 2016-12-23: 40 mg via INTRAVENOUS

## 2016-12-23 MED ORDER — ASPIRIN 81 MG PO TBEC: 81.0000 mg | DELAYED_RELEASE_TABLET | Freq: Every day | ORAL | 0 refills | Status: DC

## 2016-12-23 MED ORDER — SODIUM CHLORIDE 0.9 % IV SOLN
INTRAVENOUS | Status: DC
  Administered 2016-12-23: 09:00:00 via INTRAVENOUS

## 2016-12-23 MED ORDER — PROPOFOL 10 MG/ML IV BOLUS
INTRAVENOUS | Status: DC | PRN
  Administered 2016-12-23 (×4): 20 mg via INTRAVENOUS

## 2016-12-23 MED ORDER — PROPOFOL 500 MG/50ML IV EMUL
INTRAVENOUS | Status: DC | PRN
  Administered 2016-12-23: 100 ug/kg/min via INTRAVENOUS

## 2016-12-23 NOTE — Discharge Instructions (Signed)
You were admitted for having symptomatic anemia due to rectal bleeding. I contacted your cardiologist who recommended you stop your anticoagulants temporarilly. We provided you with 3 doses of blood transfusions and your hemoglobin greatly improved. GI performed a colonoscopy and endoscopy which showed diverticulosis and a thin lined stomach at risk for bleeding which had resolved and was treated. GI cleared you for discharge and recommended you restart your Brilinta and aspirin in 3 days. I have scheduled you a hospital follow-up appointment on 12/30/2016 at 3:30 PM.  Diverticulosis Diverticulosis is the condition that develops when small pouches (diverticula) form in the wall of your colon. Your colon, or large intestine, is where water is absorbed and stool is formed. The pouches form when the inside layer of your colon pushes through weak spots in the outer layers of your colon. CAUSES  No one knows exactly what causes diverticulosis. RISK FACTORS  Being older than 12. Your risk for this condition increases with age. Diverticulosis is rare in people younger than 40 years. By age 80, almost everyone has it.  Eating a low-fiber diet.  Being frequently constipated.  Being overweight.  Not getting enough exercise.  Smoking.  Taking over-the-counter pain medicines, like aspirin and ibuprofen. SYMPTOMS  Most people with diverticulosis do not have symptoms. DIAGNOSIS  Because diverticulosis often has no symptoms, health care providers often discover the condition during an exam for other colon problems. In many cases, a health care provider will diagnose diverticulosis while using a flexible scope to examine the colon (colonoscopy). TREATMENT  If you have never developed an infection related to diverticulosis, you may not need treatment. If you have had an infection before, treatment may include:  Eating more fruits, vegetables, and grains.  Taking a fiber supplement.  Taking a live  bacteria supplement (probiotic).  Taking medicine to relax your colon. HOME CARE INSTRUCTIONS   Drink at least 6-8 glasses of water each day to prevent constipation.  Try not to strain when you have a bowel movement.  Keep all follow-up appointments. If you have had an infection before:  Increase the fiber in your diet as directed by your health care provider or dietitian.  Take a dietary fiber supplement if your health care provider approves.  Only take medicines as directed by your health care provider. SEEK MEDICAL CARE IF:   You have abdominal pain.  You have bloating.  You have cramps.  You have not gone to the bathroom in 3 days. SEEK IMMEDIATE MEDICAL CARE IF:   Your pain gets worse.  Yourbloating becomes very bad.  You have a fever or chills, and your symptoms suddenly get worse.  You begin vomiting.  You have bowel movements that are bloody or black. MAKE SURE YOU:  Understand these instructions.  Will watch your condition.  Will get help right away if you are not doing well or get worse. This information is not intended to replace advice given to you by your health care provider. Make sure you discuss any questions you have with your health care provider. Document Released: 07/30/2004 Document Revised: 11/07/2013 Document Reviewed: 09/27/2013 Elsevier Interactive Patient Education  2017 [employer].

## 2016-12-23 NOTE — Op Note (Signed)
[facility] Patient Name: [name] [name] Procedure Date : 12/23/2016 MRN: [id number] Attending MD: Milus Banister , MD Date of Birth: 16-Feb-1951 CSN: [id number] Age: 65 Admit Type: Inpatient Procedure:                Upper GI endoscopy Indications:              Iron deficiency anemia, Heme positive stool Providers:                Milus Banister, MD, Cleda Daub, RN, Cherylynn Ridges, Technician, Claybon Jabs, CRNA Referring MD:              Medicines:                Monitored Anesthesia Care Complications:            No immediate complications. Estimated blood loss:                            None. Estimated Blood Loss:     Estimated blood loss: none. Procedure:                Pre-Anesthesia Assessment:                           - Prior to the procedure, a History and Physical                            was performed, and patient medications and                            allergies were reviewed. The patient's tolerance of                            previous anesthesia was also reviewed. The risks                            and benefits of the procedure and the sedation                            options and risks were discussed with the patient.                            All questions were answered, and informed consent                            was obtained. Prior Anticoagulants: The patient has                            taken anticoagulant medication, last dose was 2                            days prior to procedure. ASA Grade Assessment: IV -  A patient with severe systemic disease that is a                            constant threat to life. After reviewing the risks                            and benefits, the patient was deemed in                            satisfactory condition to undergo the procedure.                           After obtaining informed consent, the endoscope was     passed under direct vision. Throughout the                            procedure, the patient's blood pressure, pulse, and                            oxygen saturations were monitored continuously. The                            [id number]) scope was introduced through the                            mouth, and advanced to the second part of duodenum.                            The upper GI endoscopy was accomplished without                            difficulty. The patient tolerated the procedure                            well. Scope In: Scope Out: Findings:      The esophagus was normal.      The examined duodenum was normal.      Moderate gastric antral vascular ectasia was present in the gastric       antrum. This was treated with APC. There was intermittent oozing of       blood during the treatment, but all bleeding had stoppped by the end of       the procedure.      The exam was otherwise without abnormality. Impression:               - Gastric antral vascular ectasia (GAVE or                            [place] stomach), this was treated with APC.                            This is the most likely source of her anemia, FOBT                            +  stool. Moderate Sedation:      none Recommendation:           - Return patient to hospital ward for ongoing care.                           - Advance diet as tolerated. OK to d/c later today                            or tomorrow.                           - She needs to continue daily iron supplement,                            indefinitely. PCP will need to follow serious blood                            counts and call GI if another significant drop in                            Hb.                           - OK to resume ASA, brillinta in in 2-3 days. Procedure Code(s):        --- Professional ---                           [phone], Esophagogastroduodenoscopy, flexible,                            transoral; with  control of bleeding, any method Diagnosis Code(s):        --- Professional ---                           [id number], Angiodysplasia of stomach and duodenum                            without bleeding                           D50.9, Iron deficiency anemia, unspecified                           R19.5, Other fecal abnormalities CPT copyright 2016 American Medical Association. All rights reserved. The codes documented in this report are preliminary and upon coder review may  be revised to meet current compliance requirements. Milus Banister, MD 12/23/2016 10:19:35 AM This report has been signed electronically. Number of Addenda: 0

## 2016-12-23 NOTE — Interval H&P Note (Signed)
History and Physical Interval Note:  12/23/2016 8:59 AM  [name] [name]  has presented today for surgery, with the diagnosis of FOBT + stool and anemia.  The various methods of treatment have been discussed with the patient and family. After consideration of risks, benefits and other options for treatment, the patient has consented to  Procedure(s): COLONOSCOPY WITH PROPOFOL (N/A) ESOPHAGOGASTRODUODENOSCOPY (EGD) WITH PROPOFOL (N/A) as a surgical intervention .  The patient's history has been reviewed, patient examined, no change in status, stable for surgery.  I have reviewed the patient's chart and labs.  Questions were answered to the patient's satisfaction.     Milus Banister

## 2016-12-23 NOTE — Progress Notes (Signed)
Family Medicine Teaching Service Daily Progress Note Intern Pager: [phone]  Patient name: [name] [name] Medical record number: [id number] Date of birth: 01-31-1951 Age: 66 y.o. Gender: female  Primary Care Provider: Smiley Houseman, MD Consultants: Nephrology, Gastroenterology Code Status: Partial (DNI)  Pt Overview and Major Events to Date:  02/06: Admit for symptomatic anemia 5.3, transfused 3 units with HD with repeat H/H 6.8>9.5  Assessment and Plan: [name] [name] is a 66 y.o. female presenting with GI bleed. PMH is significant for diastolic CHF, ESRD on HD, CAD s/p 4 DES placed in 01/2016, hypertension and hypotension during HD, chronic cough, HLD.  #Symptomatic anemia 2/2 GI bleed: Acute, stable but needs further work up and management. History of melena and abdominal pain for the last 3 weeks. Subsequently developed weakness, dizziness, and shortness of breath. FOBT positive and dark red stools noted on rectal exam. Hgb noted to be 5.3 with improvement to 9.5 today s/p transfusion x3 units pRBC. More likely to be an upper GI bleed. Differentials include gastritis vs PUD, although Pt does not endorse gnawing epigastric pain or NSAID use. She had an EGD performed 2014 for gastroparesis that showed some gastric mucosal atrophy and erythema.  Gastric or intestinal tumor possible. Per chart review, Pt has not had a colonoscopy in over 20 years and her PCP has been trying to refer her for colonoscopy for the last 1.5 years. She could also have a diverticular bleed, but this would be more likely to cause painless bright red bleeding. Hemorrhoids noted on rectal exam, but this would also cause bright red bleeding. --Cardiac monitoring --Nephrology consulted, appreciated recommendations --Transfuse with goal >9 per nephrology, will need to monitor volume status if necessary given ESRD --Gastroenterology consulted, appreciated recommendations --Plan for colonoscopy with possible endoscopy  2/7 --Protonix 80 mg QD --Monitor CBC (Hgb) --Tylenol PRN pain --Zofran and Phenergan PRN nausea  #HFpEF: Chronic, stable. Last ECHO 06/19/2016: EF 45-50%, G2DD. Pt appears fluid overloaded on exam with crackles and 2+ symmetric lower extremity pitting edema to the mid shin. Resolved since HD. Normal work of breathing and maintaining O2 saturations on room air. CXR c/w COPD with mod CHF and chronic scarring of L lung which could have superimposed PNA however lungs clear and without productive cough or fevers. --Daily weights  #ESRD: Chronic, stable. Receives HD T/Th/Sat. --Continue home Renvela, calcium carbonate, Rena-Vit --Nephrology consulted, appreciate recommendations  #Protein Calorie Malnutrition: Suspect given ESRD and h/o 80 lb weight loss endorsed by patient.  --Nutrition consulted  #Hyponatremia: Acute, resolved. Likely in the setting of volume overload. Na 129 on admission, decreased from 142 on 12/16/16. Resolved to 135 after HD. --Trend renal function panel (Na)  #CAD s/p PCI w/ 4 drug eluding stents placed in 01/2016: Pt determined to be a Plavix non-responder. --Stopped Brilinta and Aspirin 81 mg per cardiology recommendations over the phone after admission for GI bleed, okay to stop given 11 months of treatment, restart per GI recs  #Hypertension with a hx of hypotension: Hypotension worsened on HD days. Pt hypertensive in the ED with BPs up to 182/44, likely in the setting of volume overload. --Midodrine 10 mg QD, at bedtime on M/W/F and before HD on T/T/S  #Chronic Cough: Chronic, stable. Has been going on for 2 years. Follows with a Neurosurgeon. May have some underlying lung disease conformed on CXR, as she was exposed to heavy smoke by her husband for 40 years. --Tessalon --Dulera BID --Albuterol neb PRN  #Hyperlipidemia: Well-controlled. Last lipid  panel 09/2015: chol 93, HDL 34, LDL 43, TG 81. --Lipitor 40 mg QD  FEN/GI: NPO for scope  procedure Prophylaxis: SCDs, holding anticoagulation in the setting of GI bleed  Disposition: Pending stabilization of hemoglobin, GI work-up, and improvement in volume status.  Subjective:  Patient says she did not get sleep last night but feels tired. Denies feelings of weakness, lightheadedness, or worsening SOB from baseline. Asking when she will get colonoscopy procedure.  Objective: Temp:  [97.1 F (36.2 C)-98.6 F (37 C)] 98.5 F (36.9 C) (02/06 2051) Pulse Rate:  [53-63] 54 (02/07 0605) Resp:  [13-23] 18 (02/07 0605) BP: (74-151)/(40-94) 124/40 (02/07 0605) SpO2:  [95 %-98 %] 97 % (02/07 0605) Weight:  [159 lb 13.3 oz (72.5 kg)-164 lb 3.9 oz (74.5 kg)] 159 lb 13.3 oz (72.5 kg) (02/07 [id number]) Physical Exam: General: well nourished, well developed, in no acute distress with non-toxic appearance HEENT: normocephalic, atraumatic, moist mucous membranes Neck: supple, non-tender without lymphadenopathy, no JVD CV: regular rate and rhythm without murmurs, rubs, or gallops, no LE edema Lungs: clear to auscultation bilaterally with normal work of breathing on room air Abdomen: soft, non-tender, no masses or organomegaly palpable, normoactive bowel sounds Skin: warm, dry, no rashes or lesions, cap refill < 2 seconds Extremities: warm and well perfused, normal tone, fistula right upper arm intact w/o erythema  Laboratory:  Recent Labs Lab 12/21/16 1944 12/22/16 0829 12/22/16 1206  WBC 7.8 7.6 7.3  HGB 5.3* 6.8* 6.2*  HCT 16.9* 21.3* 19.5*  PLT 209 230 211    Recent Labs Lab 12/16/16 1002 12/21/16 1944 12/22/16 0829 12/22/16 1206  NA 142 129* 133* 135  K 3.5 3.6 4.3 3.9  CL 98 90* 93* 96*  CO2 32* 26 22 25   BUN 37* 48* 58* 54*  CREATININE 3.56* 5.00* 5.57* 5.25*  CALCIUM 8.0* 8.2* 8.7* 8.5*  PROT 5.4* 5.2*  --   --   BILITOT 0.5 0.6  --   --   ALKPHOS 87 80  --   --   ALT 12 12*  --   --   AST 22 20  --   --   GLUCOSE 144* 128* 122* 103*   --HBsAg:  Negative  Imaging/Diagnostic Tests: X-ray chest PA and lateral (12/23/2015) FINDINGS: The lungs are well-expanded. There is patchy density in the left mid lung. The left hemidiaphragm is obscured. A known left pleural effusion has increased slightly in size. The right lung is hyperinflated and clear. The cardiac silhouette remains enlarged. There is calcification in the wall of the aortic arch. The pulmonary vascularity is engorged. The bony thorax exhibits no acute abnormality.  IMPRESSION: COPD with superimposed moderate CHF. Patchy alveolar opacities on the left are not new and may reflect chronic scarring but superimposed pneumonia may be present. Thoracic aortic atherosclerosis.    [place] Bing, DO 12/23/2016, 7:13 AM PGY-1, [place] Intern pager: [phone], text pages welcome

## 2016-12-23 NOTE — Clinical Social Work Note (Signed)
CSW talked with patient at the bedside and confirmed she is from [place]. [name] [name] reported that she and her sister [name] live together in an apartment. Patient indicated that she may be discharging tomorrow after dialysis and her plan is to return to her apartment. Her sister will provide transportation per patient. CSW will continue to follow through discharge.  Dawanna Grauberger Givens, MSW, LCSW Licensed Clinical Social Worker [place] [phone]

## 2016-12-23 NOTE — Progress Notes (Signed)
Initial Nutrition Assessment  DOCUMENTATION CODES:   Not applicable  INTERVENTION:  Provide Nepro Shake po once daily, each supplement provides 425 kcal and 19 grams protein.  Encourage adequate PO intake.   NUTRITION DIAGNOSIS:   Increased nutrient needs related to chronic illness as evidenced by estimated needs.  GOAL:   Patient will meet greater than or equal to 90% of their needs  MONITOR:   PO intake, Supplement acceptance, Labs, Weight trends, Skin, I & O's  REASON FOR ASSESSMENT:   Consult Assessment of nutrition requirement/status  ASSESSMENT:   66 y.o. female presenting with GI bleed. PMH is significant for diastolic CHF, ESRD on HD, CAD s/p 4 DES placed in 01/2016, hypertension and hypotension during HD, chronic cough, HLD.  Procedure (2/7): COLONOSCOPY WITH PROPOFOL ESOPHAGOGASTRODUODENOSCOPY (EGD) WITH PROPOFOL   Diet has been advanced. Pt reports hunger during time of visit and was ready for her lunch tray. Pt reports eating well PTA with no other difficulties. Weight has been fluctuating per weight records, however no long term significant weight loss seen. RD to order nutritional supplements to aid in caloric and protein needs.   Nutrition-Focused physical exam completed. Findings are severe fat depletion, no muscle depletion, and no edema.   Labs and medications reviewed.   Diet Order:  Diet renal with fluid restriction Fluid restriction: 1200 mL Fluid; Room service appropriate? Yes; Fluid consistency: Thin  Skin:  Reviewed, no issues  Last BM:  2/6  Height:   Ht Readings from Last 1 Encounters:  12/21/16 5\' 3"  (1.6 m)    Weight:   Wt Readings from Last 1 Encounters:  12/23/16 159 lb 13.3 oz (72.5 kg)    Ideal Body Weight:  52.27 kg  BMI:  Body mass index is 28.31 kg/m.  Estimated Nutritional Needs:   Kcal:  1850-2050  Protein:  90-100 grams  Fluid:  1.2 L/day  EDUCATION NEEDS:   No education needs identified at this  time  Corrin Parker, MS, RD, LDN Pager # [phone] After hours/ weekend pager # [phone]

## 2016-12-23 NOTE — Op Note (Signed)
[facility] Patient Name: [name] [name] Procedure Date : 12/23/2016 MRN: [id number] Attending MD: Milus Banister , MD Date of Birth: 07-30-1951 CSN: [id number] Age: 66 Admit Type: Inpatient Procedure:                Colonoscopy Indications:              Heme positive stool, Iron deficiency anemia (in                            setting of daily brillinta and low dose ASA) Providers:                Milus Banister, MD, Cleda Daub, RN, Cherylynn Ridges, Technician, Claybon Jabs, CRNA Referring MD:              Medicines:                Monitored Anesthesia Care Complications:            No immediate complications. Estimated blood loss:                            None. Estimated Blood Loss:     Estimated blood loss: none. Procedure:                Pre-Anesthesia Assessment:                           - Prior to the procedure, a History and Physical                            was performed, and patient medications and                            allergies were reviewed. The patient's tolerance of                            previous anesthesia was also reviewed. The risks                            and benefits of the procedure and the sedation                            options and risks were discussed with the patient.                            All questions were answered, and informed consent                            was obtained. Prior Anticoagulants: The patient has                            taken anticoagulant medication, last dose was 3  days prior to procedure. ASA Grade Assessment: IV -                            A patient with severe systemic disease that is a                            constant threat to life. After reviewing the risks                            and benefits, the patient was deemed in                            satisfactory condition to undergo the procedure.                           After  obtaining informed consent, the colonoscope                            was passed under direct vision. Throughout the                            procedure, the patient's blood pressure, pulse, and                            oxygen saturations were monitored continuously. The                            [id number]) scope was introduced through                            the anus and advanced to the the cecum, identified                            by appendiceal orifice and ileocecal valve. The                            colonoscopy was performed without difficulty. The                            patient tolerated the procedure well. The quality                            of the bowel preparation was adequate. The                            ileocecal valve, appendiceal orifice, and rectum                            were photographed. Scope In: 9:28:14 AM Scope Out: 9:41:58 AM Scope Withdrawal Time: 0 hours 6 minutes 16 seconds  Total Procedure Duration: 0 hours 13 minutes 44 seconds  Findings:      Multiple small and large-mouthed diverticula were found in the left       colon.  The exam was otherwise without abnormality on direct and retroflexion       views. Impression:               - Diverticulosis in the left colon.                           - The examination was otherwise normal on direct                            and retroflexion views.                           - No polyps or cancers. Moderate Sedation:      none Recommendation:           - EGD now to continue the GI workup Procedure Code(s):        --- Professional ---                           [phone], Colonoscopy, flexible; diagnostic, including                            collection of specimen(s) by brushing or washing,                            when performed (separate procedure) Diagnosis Code(s):        --- Professional ---                           R19.5, Other fecal abnormalities                            D50.9, Iron deficiency anemia, unspecified                           K57.30, Diverticulosis of large intestine without                            perforation or abscess without bleeding CPT copyright 2016 American Medical Association. All rights reserved. The codes documented in this report are preliminary and upon coder review may  be revised to meet current compliance requirements. Milus Banister, MD 12/23/2016 9:47:52 AM This report has been signed electronically. Number of Addenda: 0

## 2016-12-23 NOTE — Progress Notes (Signed)
[name] [name] to be D/C'd Home per MD order.  Discussed prescriptions and follow up appointments with the patient. Prescriptions given to patient, medication list explained in detail. Pt verbalized understanding.  Allergies as of 12/23/2016      Reactions   Aleve [naproxen Sodium] Other (See Comments)   Due to kidneys.   Fentanyl Other (See Comments)   Facial swelling   Motrin [ibuprofen] Other (See Comments)   Due to kidneys.   Nsaids Other (See Comments)   KIDNEY FAILURE AND ONLY HAS PARTIAL LEFT KIDNEY REMAINING (RIGHT ONE TAKEN OUT)   Tape Other (See Comments)   Please use "paper" tape   Contrast Media [iodinated Diagnostic Agents] Itching      Medication List    TAKE these medications   acetaminophen 500 MG tablet Commonly known as:  TYLENOL Take 500 mg by mouth every 6 (six) hours as needed for headache.   albuterol 108 (90 Base) MCG/ACT inhaler Commonly known as:  PROVENTIL HFA;VENTOLIN HFA Inhale 2 puffs into the lungs every 4 (four) hours as needed for wheezing or shortness of breath.   aspirin 81 MG EC tablet Take 1 tablet (81 mg total) by mouth daily. Start taking on:  12/26/2016   atorvastatin 40 MG tablet Commonly known as:  LIPITOR TAKE ONE TABLET BY MOUTH IN THE MORNING   benzonatate 200 MG capsule Commonly known as:  TESSALON Take 1 capsule (200 mg total) by mouth 3 (three) times daily as needed for cough.   calcium carbonate 1250 (500 Ca) MG tablet Commonly known as:  OS-CAL - dosed in mg of elemental calcium Take two (2) tablets by mouth three (3) times daily with meals and take two (2) tablets by mouth with snacks.   DULERA 200-5 MCG/ACT Aero Generic drug:  mometasone-formoterol INHALE TWO PUFFS BY MOUTH TWICE DAILY AT  8  AM  AND  10  PM   loratadine 10 MG tablet Commonly known as:  CLARITIN Take 10 mg by mouth daily.   midodrine 10 MG tablet Commonly known as:  PROAMATINE Take one (1) tablet (10 mg total) by mouth at bedtime on Mondays,  Wednesdays, and Fridays. Take one (1) tablet (10 mg total) by mouth pre-dialysis on Tuesdays, Thursdays, and Saturdays.   multivitamin Tabs tablet Take 1 tablet by mouth at bedtime.   nitroGLYCERIN 0.4 MG SL tablet Commonly known as:  NITROSTAT Place 1 tablet (0.4 mg total) under the tongue every 5 (five) minutes as needed for chest pain.   pantoprazole 40 MG tablet Commonly known as:  PROTONIX Take 1 tablet (40 mg total) by mouth 2 (two) times daily.   promethazine 25 MG tablet Commonly known as:  PHENERGAN Take 1 tablet (25 mg total) by mouth every 6 (six) hours as needed for nausea or vomiting.   sevelamer carbonate 2.4 g Pack Commonly known as:  RENVELA Take two (2) packets by mouth with each meal. Twice daily on dialysis days and Three times daily on non-dialysis days.   ticagrelor 90 MG Tabs tablet Commonly known as:  BRILINTA Take 1 tablet (90 mg total) by mouth 2 (two) times daily. Start taking on:  12/26/2016       Vitals:   12/23/16 1030 12/23/16 1133  BP: (!) 122/42 (!) 118/47  Pulse: (!) 57 (!) 57  Resp: (!) 22 20  Temp:  98.2 F (36.8 C)    Skin clean, dry and intact without evidence of skin break down, no evidence of skin tears noted. IV  catheter discontinued intact. Site without signs and symptoms of complications. Dressing and pressure applied. Pt denies pain at this time. No complaints noted.  An After Visit Summary was printed and given to the patient. Patient escorted via [place], and D/C home via private auto.  Emilio Math, RN [facility] Phone [phone]

## 2016-12-23 NOTE — Progress Notes (Signed)
[name] [name] Progress Note   Dialysis Orders:  NW TTS 4 hr 180 EDW 66.5 2 K 2 Ca NO heparin 425/A1.25 left upper AVF hectorol 4 venofer 50 per week Mircera 150 1/25 last dose (225 prior to that 1/11 iPTH 82 Ca ok P 6-7 hgb 6.6 2/3 24% sat   Assessment/Plan: 1. 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 Have ^ weekly Fe to 125;  2. ESRD -  TTS - next HD Thursday  First round - no heparin HD permanently 3. BP/volume  -sig above EDW, titrate down may take several HD treatments but should be easier once hgb is up- on midodrine for BP support- net UF 1.8 2/6 - try to UF more on Thursday 4. Severe multivessel CAD/ISCM - keep hgb >9- on asa and Brilinta (to be resumed) 5. Metabolic bone disease -  Prone to ^ P -continue hectorol and binders 6. Nutrition - renal diet alb 2.6  7. DM - per primary 8. COPD - usual pul meds - sats ok on room air 9. Access issues - outpt declining AF 573 February -  last intervention was 03/2016 had PTA of [id number] long cephalic vein stenosis; so far this has not impacted kinetics Jan and Dec , 700s , 902 Nov- plan arrange follow up after d/c 10.  Disp - favor d/c Thursday after dialysis though pt would prefer today- expect that hgb will equilibrate some  Myriam Jacobson, PA-C [place] [phone] 12/23/2016,2:07 PM  LOS: 1 day   Pt seen, examined and agree w A/P as above.  Kelly Splinter MD [employer] pager [phone]   12/23/2016, 4:02 PM    Subjective:   Feels good Appetite good. Ate most of lunch except tomato sauce  Objective Vitals:   12/23/16 1017 12/23/16 1020 12/23/16 1030  12/23/16 1133  BP: (!) 88/27 107/60 (!) 122/42 (!) 118/47  Pulse: (!) 55 60 (!) 57 (!) 57  Resp: (!) 28 18 (!) 22 20  Temp: 98.3 F (36.8 C)   98.2 F (36.8 C)  TempSrc: Oral   Oral  SpO2: 100% 99% 97% 99%  Weight:      Height:       Physical Exam General: NAD HOH, looks much better sitting in chair and eating Heart: RRR Lungs: no rales Abdomen: soft NT Extremities: 1-2 + LE ankle edema Dialysis Access: left upper AVF + bruit   Additional Objective Labs: Basic Metabolic Panel:  Recent Labs Lab 12/22/16 0829 12/22/16 1206 12/23/16 1247  NA 133* 135 139  K 4.3 3.9 3.6  CL 93* 96* 102  CO2 22 25 26   GLUCOSE 122* 103* 104*  BUN 58* 54* 20  CREATININE 5.57* 5.25* 3.52*  CALCIUM 8.7* 8.5* 8.7*  PHOS 6.1* 5.5* 4.2   Liver Function Tests:  Recent Labs Lab 12/21/16 1944 12/22/16 0829 12/22/16 1206 12/23/16 1247  AST 20  --   --   --   ALT 12*  --   --   --   ALKPHOS 80  --   --   --   BILITOT 0.6  --   --   --  PROT 5.2*  --   --   --   ALBUMIN 2.7* 2.8* 2.6* 2.7*   No results for input(s): LIPASE, AMYLASE in the last 168 hours. CBC:  Recent Labs Lab 12/21/16 1944 12/22/16 0829 12/22/16 1206 12/23/16 0716  WBC 7.8 7.6 7.3 6.5  NEUTROABS 6.1  --   --   --   HGB 5.3* 6.8* 6.2* 9.5*  HCT 16.9* 21.3* 19.5* 30.6*  MCV 97.1 95.1 94.7 94.2  PLT 209 230 211 215  Iron Studies: No results for input(s): IRON, TIBC, TRANSFERRIN, FERRITIN in the last 72 hours. Lab Results  Component Value Date   INR 1.08 12/21/2016   INR 1.10 01/30/2016   INR 1.14 04/25/2015   Studies/Results: X-ray Chest Pa And Lateral  Result Date: 12/22/2016 CLINICAL DATA:  Shortness of breath, on dialysis, history of coronary artery disease and ischemic cardiomyopathy. Diabetes and hypertension EXAM: CHEST  2 VIEW COMPARISON:  Frontal x-ray of November 30, 2016 FINDINGS: The lungs are well-expanded. There is patchy density in the left mid lung. The left hemidiaphragm is obscured. A known  left pleural effusion has increased slightly in size. The right lung is hyperinflated and clear. The cardiac silhouette remains enlarged. There is calcification in the wall of the aortic arch. The pulmonary vascularity is engorged. The bony thorax exhibits no acute abnormality. IMPRESSION: COPD with superimposed moderate CHF. Patchy alveolar opacities on the left are not new and may reflect chronic scarring but superimposed pneumonia may be present. Thoracic aortic atherosclerosis. Electronically Signed   By: David  Martinique M.D.   On: 12/22/2016 07:41   Medications:  . sodium chloride   Intravenous Once  . atorvastatin  40 mg Oral q morning - 10a  . calcium carbonate  2 tablet Oral BID WC  . Chlorhexidine Gluconate Cloth  6 each Topical Q0600  . [START ON 12/24/2016] darbepoetin (ARANESP) injection - DIALYSIS  200 mcg Intravenous Q Thu-HD  . doxercalciferol  4 mcg Intravenous Q T,Th,Sa-HD  . [START ON 12/24/2016] ferric gluconate (FERRLECIT/NULECIT) IV  125 mg Intravenous Q Thu-HD  . loratadine  10 mg Oral Daily  . midodrine  10 mg Oral Once per day on Mon Wed Fri   And  . midodrine  10 mg Oral Q T,Th,Sa-HD  . mometasone-formoterol  2 puff Inhalation BID  . multivitamin  1 tablet Oral QHS  . mupirocin ointment  1 application Nasal BID  . pantoprazole  80 mg Oral Daily  . sevelamer carbonate  4.8 g Oral BID WC  . sodium chloride flush  3 mL Intravenous Q12H

## 2016-12-23 NOTE — Transfer of Care (Signed)
Immediate Anesthesia Transfer of Care Note  Patient: [name] [name]  Procedure(s) Performed: Procedure(s): COLONOSCOPY WITH PROPOFOL (N/A) ESOPHAGOGASTRODUODENOSCOPY (EGD) WITH PROPOFOL (N/A)  Patient Location: Endoscopy Unit  Anesthesia Type:MAC  Level of Consciousness: awake, oriented and patient cooperative  Airway & Oxygen Therapy: Patient Spontanous Breathing and Patient connected to nasal cannula oxygen  Post-op Assessment: Report given to RN, Post -op Vital signs reviewed and stable and Patient moving all extremities  Post vital signs: Reviewed and stable  Last Vitals:  Vitals:   12/23/16 0848 12/23/16 1017  BP: (!) 136/52 (!) 88/27  Pulse: (!) 57 (!) 55  Resp: 10 (!) 28  Temp: 36.6 C     Last Pain:  Vitals:   12/23/16 1017  TempSrc: Oral  PainSc:          Complications: No apparent anesthesia complications

## 2016-12-23 NOTE — H&P (View-Only) (Signed)
[facility] Consult: 9:55 AM 12/22/2016  LOS: 1 day    Referring Provider: Dr. Ree Kida  Primary Care Physician:  Smiley Houseman, MD Primary Gastroenterologist:  Althia Forts. Seen by [facility]ie [facility]er in 2014 as inpatient only.  Reason for Consultation:  Normocytic anemia.  FOBT positive stool   HPI: [name] [name] is a 66 y.o. female.  PMH severe multi-vessel coronary disease, not an operative candidate, previous DES latest 01/2016. On chronic low-dose aspirin, Brilinta.  Ischemic CM, EF 45 to [id number], grade 2 diastolic dysfx, severe pulmonary htn on 06/2016 Echo.  ESRD on HD.  S/p right nephrectomy due to pyelonephritis.  DM2, on no meds currently, but insulin in past. EGD 02/2013 for N/V, Dr Kalman Shan: No anatomic cause for patient's nausea and vomiting seen. This makes the impression of gastroparesis more likely.  Gastric mucosal atrophy and erythema of uncertain clinical significance, and doubtful relevance to the patient's symptoms.  NM GES 02/2013: Calculated retention in the stomach is 65% at both one and two hours.  Normal retention is less than 30% at 2 hours.  Moderately delayed solid gastric emptying. Remote colonoscopy probably before 2000, no records.  Sigmoid diverticulosis per CT of 02/2013.    In the last 4-6 weeks the patient has seen minor streaks of blood with her stool. About a month ago she started having black, loose stools.  Patient describes previous spells of alternating constipation and nonbloody diarrhea. Appetite is variable, for some days she won't have much of an appetite and then again she may have a good appetite for several days. No nausea or vomiting. No heartburn, no abdominal pain. She denies dysphagia but does say that sometimes it feels like she has cotton balls in the back of her throat and this  leads to vigorous coughing and sometimes she will vomit/regurgitate during a vigorous coughing spell.  She was referred to pulmonologist, Dr. Lamonte Sakai for cough evaluation.  Dr. Lamonte Sakai continued on her inhaled and oral medications, added prn albuterol and Tessalon and increased her Protonix from once to twice daily.  Depending on her clinical progress he was considering future bronchoscopy.   She's had problems with hypotension during dialysis sessions and last week had foreshortened HD times and staff unable to remove sufficient fluid due to the low blood pressures.  She is beginning to feel quite bloated. Weekly Hgb have been trending down in her dialysis unit Hgb 11.1 1/11 Hgb 10.3 1/20 Hgb 9.3 1/25 Hgb 6.5 2/03 Hgb 6.6 2/03  Patient went to dialysis yesterday. After she got home, the dialysis Center called her and told her to come to the emergency room for anemia. Hgb 5.3, to 6.8 post PRBC x 1 and plan for 2 additional PRBCs at HD today.  Hgb was 10.5 on 10/22/16.   Coags normal.  FOBT +.    Last dose of Brilnta and low-dose aspirin was 2/5.  Both are now on hold.  Oral Protonix substituted with IV Pepcid. Patient does not use additional aspirin products or NSAIDs.  No ETOH.    Patient's CBC is closely  followed by nephrology. She is on regular doses of Mircera and weekly Venofer. In fact recently, when hgb was 11 on 12/10/16, Micera dose was decreased.   Her PMD has been contemplating referral to GI for routine colonoscopy, however the patient has had a lot of medical issues, which have bumped colonoscopy to the back burner.      Past Medical History:  Diagnosis Date  . Anemia   . Coronary artery disease    a. 6/16 Cath: Severe multivessel dzs->not surg candidate 2/2 debility/poor venous conduits->Med Rx; b. 01/2016 Cath/PCI: LM 50ost, LAD 40m (2.5x32 & 2.5x20 Promus Prem DES'), D1 70ost, D2 90, LCX 95p/m (3.0x16 Promus Prem DES), 65d, RCA 60p->d, RPDA 95 (2.5x12 Promus Prem DES).  . Diabetes  mellitus ([place])   . ESRD (end stage renal disease) on dialysis ([place])       . Hearing difficulty    Mild   . Hyperlipidemia   . Hypertension   . Hyponatremia   . Ischemic cardiomyopathy    a. Echo 04/2015: EF 35%. Cath same time - 45%.  . Memory loss   . Syncope   . Thrombocytopenia ([place])     Past Surgical History:  Procedure Laterality Date  . AV FISTULA PLACEMENT Left 03/07/2013   Procedure: ARTERIOVENOUS (AV) FISTULA CREATION;  Surgeon: Elam Dutch, MD;  Location: [facility];  Service: Vascular;  Laterality: Left;  left brachiocephalic AVF  . CARDIAC CATHETERIZATION N/A 04/25/2015   Procedure: Left Heart Cath and Coronary Angiography;  Surgeon: Belva Crome, MD;  Location: [facility];  Service: Cardiovascular;  Laterality: N/A;  . CARDIAC CATHETERIZATION N/A 02/03/2016   Procedure: Coronary Stent Intervention;  Surgeon: Belva Crome, MD;  Location: [facility];  Service: Cardiovascular;  Laterality: N/A;  . CORONARY STENT PLACEMENT  02/03/2016   LAD X2     CIRCUMFLEX       RCA   . ESOPHAGOGASTRODUODENOSCOPY Left 02/20/2013   Procedure: ESOPHAGOGASTRODUODENOSCOPY (EGD);  Surgeon: Cleotis Nipper, MD;  Location: [facility] ENDOSCOPY;  Service: Endoscopy;  Laterality: Left;  . 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 Authorizing Provider  acetaminophen (TYLENOL) 500 MG tablet Take 500 mg by mouth every 6 (six) hours as needed for headache.   Yes Historical Provider, MD  albuterol (PROVENTIL HFA;VENTOLIN HFA) 108 (90 Base) MCG/ACT inhaler Inhale 2 puffs into the lungs every 4 (four) hours as needed for wheezing or shortness of breath. 12/09/16  Yes Collene Gobble, MD  aspirin 81 MG EC tablet Take 1 tablet (81 mg total) by mouth daily. 03/18/16   Smiley Houseman, MD  atorvastatin (LIPITOR) 40 MG tablet TAKE ONE TABLET BY MOUTH IN THE MORNING 07/17/16   Smiley Houseman, MD    benzonatate (TESSALON) 200 MG capsule Take 1 capsule (200 mg total) by mouth 3 (three) times daily as needed for cough. 12/09/16   Collene Gobble, MD  calcium carbonate (OS-CAL - DOSED IN MG OF ELEMENTAL CALCIUM) 1250 (500 CA) MG tablet Take two (2) tablets by mouth three (3) times daily with meals and take two (2) tablets by mouth with snacks.    Historical Provider, MD  DULERA 200-5 MCG/ACT AERO INHALE TWO PUFFS BY MOUTH TWICE DAILY AT  8  AM  AND  10  PM 12/11/16   Smiley Houseman, MD  loratadine (CLARITIN) 10 MG tablet Take 10  mg by mouth daily.    Historical Provider, MD  midodrine (PROAMATINE) 10 MG tablet Take one (1) tablet (10 mg total) by mouth at bedtime on Mondays, Wednesdays, and Fridays. Take one (1) tablet (10 mg total) by mouth pre-dialysis on Tuesdays, Thursdays, and Saturdays. 11/18/15   Historical Provider, MD  multivitamin (RENA-VIT) TABS tablet Take 1 tablet by mouth at bedtime. 09/04/14   Leone Haven, MD  nitroGLYCERIN (NITROSTAT) 0.4 MG SL tablet Place 1 tablet (0.4 mg total) under the tongue every 5 (five) minutes as needed for chest pain. 03/18/16   Smiley Houseman, MD  pantoprazole (PROTONIX) 40 MG tablet Take 1 tablet (40 mg total) by mouth 2 (two) times daily. 12/09/16   Collene Gobble, MD  promethazine (PHENERGAN) 25 MG tablet Take 1 tablet (25 mg total) by mouth every 6 (six) hours as needed for nausea or vomiting. 03/18/16   Smiley Houseman, MD  sevelamer carbonate (RENVELA) 2.4 g PACK Take two (2) packets by mouth with each meal. Twice daily on dialysis days and Three times daily on non-dialysis days. 05/18/16   Historical Provider, MD  ticagrelor (BRILINTA) 90 MG TABS tablet Take 1 tablet (90 mg total) by mouth 2 (two) times daily. 03/18/16   Smiley Houseman, MD    Scheduled Meds: . atorvastatin  40 mg Oral q morning - 10a  . calcium carbonate  2 tablet Oral BID WC  . Chlorhexidine Gluconate Cloth  6 each Topical Q0600  . famotidine (PEPCID) IV  20 mg  Intravenous Q24H  . loratadine  10 mg Oral Daily  . [START ON 12/23/2016] midodrine  10 mg Oral Once per day on Mon Wed Fri   And  . midodrine  10 mg Oral Q T,Th,Sa-HD  . mometasone-formoterol  2 puff Inhalation BID  . multivitamin  1 tablet Oral QHS  . mupirocin ointment  1 application Nasal BID  . sevelamer carbonate  4.8 g Oral BID WC  . sodium chloride flush  3 mL Intravenous Q12H   Infusions:  PRN Meds: acetaminophen **OR** acetaminophen, albuterol, benzonatate, ondansetron **OR** ondansetron (ZOFRAN) IV, promethazine   Allergies as of 12/21/2016 - Review Complete 12/21/2016  Allergen Reaction Noted  . Aleve [naproxen sodium] Other (See Comments) 02/13/2013  . Fentanyl Other (See Comments) 02/04/2016  . Motrin [ibuprofen] Other (See Comments) 02/13/2013  . Nsaids Other (See Comments) 09/16/2015  . Tape Other (See Comments) 02/03/2016  . Contrast media [iodinated diagnostic agents] Itching 02/04/2016    Family History  Problem Relation Age of Onset  . Bone cancer Father     Of jaws   . Throat cancer Father   . Heart attack Father   . Heart disease    . Diabetes    . Stroke    . Heart attack Sister   . Stroke Sister   . Stroke Brother     Social History   Social History  . Marital status: Widowed    Spouse name: N/A  . Number of children: N/A  . Years of education: N/A   Occupational History  . Not on file.   Social History Main Topics  . Smoking status: Never Smoker  . Smokeless tobacco: Never Used  . Alcohol use No  . Drug use: No  . Sexual activity: Not Currently    Birth control/ protection: Abstinence   Other Topics Concern  . Not on file   Social History Narrative   Lives with husband Latawnya Girardeau (born [birth year]).  Denies alcohol, tobacco (never smoked), other drugs   Hobbies: reading          REVIEW OF SYSTEMS: Constitutional:  No change in stable weakness and fatigue. ENT:  No nose bleeds Pulm:  Some shortness of breath with heavier  exertion such as climbing multiple stairs, however she generally doesn't exert herself much at all. CV:  No chest pain, no palpitations. GU:  No hematuria, no frequency GI:  Per HPI Heme:  No excessive bleeding or bruising   Transfusions:  Does not recall previous transfusions. Neuro:  No falls, religious in her use of a walker at home. Derm:  No itching, no rash or sores.  Endocrine:  Patient says that after she lost 80 pounds a few years back, she no longer required diabetic medications. Immunization:  Did not inquire as to recent vaccinations. Travel:  None beyond local counties in last few months.    PHYSICAL EXAM: Vital signs in last 24 hours: Vitals:   12/22/16 0150 12/22/16 0404  BP:  (!) 142/60  Pulse:  60  Resp:  (!) 22  Temp: 98.4 F (36.9 C) 98.4 F (36.9 C)   Wt Readings from Last 3 Encounters:  12/21/16 74.4 kg (164 lb)  12/09/16 68.5 kg (151 lb)  10/22/16 67.1 kg (148 lb)   General: Chronically ill appearing, alert, comfortable elderly WF. Head:  No facial asymmetry or swelling, no signs of head trauma.  Eyes:  No conjunctival pallor. EOMI. Slight lid lag on the left. Ears:  HOH, did not bring her hearing aids with her.  Nose:  No discharge or congestion Mouth:  Tongue midline. Poor dentition with many missing teeth. Mucosa pink, clear and somewhat dry. Neck:  No masses, no thyromegaly. Lungs:  Clear bilaterally. Some dyspnea with prolonged speaking. Infrequent dry cough. Heart: RRR. No MRG. S1, S2 present. Abdomen:  Soft. Nontender. No distention. Bowel sounds active. No bruits. No masses or HSM.Marland Kitchen   Rectal: Pastelike black stool which is rapidly 3+ FOBT +.   Musc/Skeltl: No gross joint swelling or redness. Some spinal kyphosis. Extremities:  2 plus edema in the feet and pretibial region bilaterally. No bulging AV fistula with palpable thrill on left arm Neurologic:  Alert, oriented times 3. No tremor. Moves all 4 limbs, strength not tested. Skin:  Extensive  vitiligo on face, trunk, limbs. Tattoos:  None Nodes:  No cervical adenopathy   Psych:  Pleasant, talkative. Slightly anxious.  Intake/Output from previous day: 02/05 0701 - 02/06 0700 In: 335 [Blood:335] Out: -  Intake/Output this shift: No intake/output data recorded.  LAB RESULTS:  Recent Labs  12/21/16 1944 12/22/16 0829  WBC 7.8 7.6  HGB 5.3* 6.8*  HCT 16.9* 21.3*  PLT 209 230   BMET Lab Results  Component Value Date   NA 133 (L) 12/22/2016   NA 129 (L) 12/21/2016   NA 142 12/16/2016   K 4.3 12/22/2016   K 3.6 12/21/2016   K 3.5 12/16/2016   CL 93 (L) 12/22/2016   CL 90 (L) 12/21/2016   CL 98 12/16/2016   CO2 22 12/22/2016   CO2 26 12/21/2016   CO2 32 (H) 12/16/2016   GLUCOSE 122 (H) 12/22/2016   GLUCOSE 128 (H) 12/21/2016   GLUCOSE 144 (H) 12/16/2016   BUN 58 (H) 12/22/2016   BUN 48 (H) 12/21/2016   BUN 37 (H) 12/16/2016   CREATININE 5.57 (H) 12/22/2016   CREATININE 5.00 (H) 12/21/2016   CREATININE 3.56 (H) 12/16/2016   CALCIUM 8.7 (  L) 12/22/2016   CALCIUM 8.2 (L) 12/21/2016   CALCIUM 8.0 (L) 12/16/2016   LFT  Recent Labs  12/21/16 1944 12/22/16 0829  PROT 5.2*  --   ALBUMIN 2.7* 2.8*  AST 20  --   ALT 12*  --   ALKPHOS 80  --   BILITOT 0.6  --    PT/INR Lab Results  Component Value Date   INR 1.08 12/21/2016   INR 1.10 01/30/2016   INR 1.14 04/25/2015   Hepatitis Panel No results for input(s): HEPBSAG, HCVAB, HEPAIGM, HEPBIGM in the last 72 hours. C-Diff No components found for: CDIFF Lipase     Component Value Date/Time   LIPASE 17 09/16/2015 1355    Drugs of Abuse     Component Value Date/Time   LABOPIA NONE DETECTED 02/14/2013 0832   COCAINSCRNUR NONE DETECTED 02/14/2013 0832   LABBENZ NONE DETECTED 02/14/2013 0832   AMPHETMU NONE DETECTED 02/14/2013 0832   THCU NONE DETECTED 02/14/2013 0832   LABBARB NONE DETECTED 02/14/2013 [id number]     RADIOLOGY STUDIES: X-ray Chest Pa And Lateral  Result Date: 12/22/2016 CLINICAL  DATA:  Shortness of breath, on dialysis, history of coronary artery disease and ischemic cardiomyopathy. Diabetes and hypertension EXAM: CHEST  2 VIEW COMPARISON:  Frontal x-ray of November 30, 2016 FINDINGS: The lungs are well-expanded. There is patchy density in the left mid lung. The left hemidiaphragm is obscured. A known left pleural effusion has increased slightly in size. The right lung is hyperinflated and clear. The cardiac silhouette remains enlarged. There is calcification in the wall of the aortic arch. The pulmonary vascularity is engorged. The bony thorax exhibits no acute abnormality. IMPRESSION: COPD with superimposed moderate CHF. Patchy alveolar opacities on the left are not new and may reflect chronic scarring but superimposed pneumonia may be present. Thoracic aortic atherosclerosis. Electronically Signed   By: David  Martinique M.D.   On: 12/22/2016 07:41    IMPRESSION:   *   Acute on chronic anemia, in setting of GI bleeding.  She takes chronic Protonix and recently the dose was doubled so likelihood of peptic ulcer disease is low. Question AVMs, question neoplasia. On chronic Mircera and Venofer.   *  Diabetic gastroparesis on nuc med GES in 2014. After losing close to 80 pounds, her diabetes went "into remission"  and she does not describe any symptoms consistent with gastroparesis currently.  *  ESRD.  TTS hemodialysis. Recently hypotension has limited Nephrology's ability to pull fluid off during HD sessions and she is clinically volume overloaded.   *  Chronic cough.  Occasionally has regurgitation with especially vigorous coughing.  Dr. Lamonte Sakai follows. CXR with COPD, CHF, ? PNA.        PLAN:     *  Switch back to Protonix BID.  Clears.      Azucena Freed  12/22/2016, 9:55 AM Pager: [phone]    ________________________________________________________________________  Velora Heckler GI MD note:  I personally examined the patient, reviewed the data and agree with the  assessment and plan described above.  She has had significant drop in Hb over past couple months with intermittent dark stools.  EGD 3-4 years ago and colonoscopy almost 20 years ago.  I recommended that we workup the anemia, bleeding with a colonoscopy +/- EGD tomorrow.  She understands and agrees.   Owens Loffler, MD [facility] Pager [phone]

## 2016-12-23 NOTE — Anesthesia Preprocedure Evaluation (Addendum)
Anesthesia Evaluation  Patient identified by MRN, date of birth, ID band Patient awake    Reviewed: Allergy & Precautions, NPO status , Patient's Chart, lab work & pertinent test results  Airway Mallampati: II  TM Distance: >3 FB Neck ROM: Full    Dental  (+) Poor Dentition, Dental Advisory Given   Pulmonary neg pulmonary ROS,     + decreased breath sounds      Cardiovascular hypertension, Pt. on medications + angina with exertion + CAD   Rhythm:Regular Rate:Normal     Neuro/Psych    GI/Hepatic Gi bleed   Endo/Other  diabetes, Type 2  Renal/GU DialysisRenal disease     Musculoskeletal   Abdominal (+) + obese,   Peds  Hematology  (+) anemia ,   Anesthesia Other Findings   Reproductive/Obstetrics                            Anesthesia Physical Anesthesia Plan  ASA: IV  Anesthesia Plan: MAC   Post-op Pain Management:    Induction: Intravenous  Airway Management Planned: Natural Airway and Nasal Cannula  Additional Equipment: None  Intra-op Plan:   Post-operative Plan:   Informed Consent: I have reviewed the patients History and Physical, chart, labs and discussed the procedure including the risks, benefits and alternatives for the proposed anesthesia with the patient or authorized representative who has indicated his/her understanding and acceptance.   Dental advisory given  Plan Discussed with: CRNA, Anesthesiologist and Surgeon  Anesthesia Plan Comments:        Anesthesia Quick Evaluation

## 2016-12-23 NOTE — Anesthesia Postprocedure Evaluation (Addendum)
Anesthesia Post Note  Patient: [name] [name]  Procedure(s) Performed: Procedure(s) (LRB): COLONOSCOPY WITH PROPOFOL (N/A) ESOPHAGOGASTRODUODENOSCOPY (EGD) WITH PROPOFOL (N/A)  Patient location during evaluation: PACU Anesthesia Type: MAC Level of consciousness: awake and alert Pain management: pain level controlled Vital Signs Assessment: post-procedure vital signs reviewed and stable Respiratory status: spontaneous breathing, nonlabored ventilation, respiratory function stable and patient connected to nasal cannula oxygen Cardiovascular status: stable and blood pressure returned to baseline Anesthetic complications: no       Last Vitals:  Vitals:   12/23/16 1020 12/23/16 1030  BP: 107/60 (!) 122/42  Pulse: 60 (!) 57  Resp: 18 (!) 22  Temp:      Last Pain:  Vitals:   12/23/16 1017  TempSrc: Oral  PainSc:                  Chanc Kervin,JAMES TERRILL

## 2016-12-23 NOTE — Progress Notes (Signed)
Transitions of Care Pharmacy Note  Plan:  Educated on holding ASA/Brilinta until Saturday February 10th, and then continuing until cardiologist recommends discontinuing. --------------------------------------------- [name] [name] is an 66 y.o. female who presents with a chief complaint GI bleed. In anticipation of discharge, pharmacy has reviewed this patient's prior to admission medication history, as well as current inpatient medications listed per the [facility].  Current medication indications, dosing, frequency, and notable side effects reviewed with patient. patient verbalized understanding of current inpatient medication regimen and is aware that the After Visit Summary when presented, will represent the most accurate medication list at discharge.   Assessment: Understanding of regimen: good Understanding of indications: good Potential of compliance: good Barriers to Obtaining Medications: No  Patient instructed to contact inpatient pharmacy team with further questions or concerns if needed.    Time spent preparing for discharge counseling: 10 Time spent counseling patient: 14   Thank you for allowing pharmacy to be a part of this patient's care.  Arrie Senate, PharmD PGY-1 Pharmacy Resident Pager: [phone] 12/23/2016

## 2016-12-24 ENCOUNTER — Telehealth: Payer: Self-pay | Admitting: Internal Medicine

## 2016-12-24 NOTE — Telephone Encounter (Signed)
Telephone Note:   Called patient as she was hospitalized from 2/5 to 2/7 for severe anemia due to GI bleed. Reports she is doing well since discharge yesterday. She has not had a BM since discharge since the GI doctor "cleaned out my bowels" for the Colonoscopy. Reports that the nephrologists decided to do extra HD sessions as she has more fluid retention. She does not have any trouble breathing currently. We also discussed her pleural fluid studies from her recent throacentesis; I reported to her that I have been trying to get in touch with her Pulmonologist to discuss without much success but will continue to do so. She reports that she was seen by the pulmonologist and given Albuterol and Tessalon by her pulmonologist which does not seem to work for her cough. She is using Dulera once a day; we discussed using this twice a day. Patient has a follow up appointment in clinic on 2/14 with Dr. Jerline Pain.    Smiley Houseman, MD PGY 2 Family Medicine

## 2016-12-25 ENCOUNTER — Telehealth: Payer: Self-pay | Admitting: *Deleted

## 2016-12-25 LAB — TYPE AND SCREEN
BLOOD PRODUCT EXPIRATION DATE: 201803042359
BLOOD PRODUCT EXPIRATION DATE: 201803042359
BLOOD PRODUCT EXPIRATION DATE: 201803082359
Blood Product Expiration Date: 201803032359
Blood Product Expiration Date: 201803082359
ISSUE DATE / TIME: 201802060056
ISSUE DATE / TIME: 201802061306
ISSUE DATE / TIME: 201802061306
UNIT TYPE AND RH: 5100
UNIT TYPE AND RH: 5100
UNIT TYPE AND RH: 5100
Unit Type and Rh: 5100
Unit Type and Rh: 5100

## 2016-12-25 NOTE — Telephone Encounter (Signed)
Transitional Care Post-Discharge Follow-Up Phone Call:   Admit date: 12/21/2016 Discharge date: 12/23/2016  Discharge Disposition: Home  Best patient contact number: [phone] Emergency contact(s): Arita Miss (Sister) same number PCP: [name]  Principal Discharge Diagnosis: Symptomatic Anemia  Reason for Chronic Case Management: Co-morbidities as follows:  HTN, CAD s/p PCL with 4 DE Stents, HFpEF, ESRD on HD, Gastric antral vascular ectasia, GERD, Chronic cough  Post-discharge Communication:   Call Completed: Yes, with patient   Interpreter Needed: No   Please check all that apply:  X Patient is caring for self at home. Lives with sister ? Patient has caregiver. If so, name and best contact number:  X Patient is knowledgeable of his/her condition(s) and/or treatment.  ? Family and/or caregiver is knowledgeable of patient's condition(s) and/or treatment.   Medication Reconciliation:  X Medication list reviewed with patient.  X Patient has all discharge medications uses [facility] on [address]. Has all current meds. Understands to restart ASA 81 mg daily and Brilinta bid starting tomorrow. ? Patient has O2/CPAP ordered? If so, name of company that supplies:  Activities of Daily Living:  X  Independent  ? Needs assist (describe)  ? Total Care (describe)   Community resources in place for patient:  X None  ? Home Health If so, name of agency: ? [facility] If so, name of Care Manager and contact number:   ? Assisted Living  ? Hospice  ? Support Group    Topics discussed:  Home Environment: Patient lives with sister in [place] at [facility]; Agricultural consultant. Has communal dining hall with nutritionist who ensures meals are portion controlled and healthy.  Support System: Filiberto Pinks (Sister)  Home DME: Patient walks exclusively with walker  Transportation:Sister has car and drives patient to appts. Also uses [facility]  Food/Nutrition: Denies food insecurity as retirement  center provides meals.  Identified Barriers: None at this time  Topics Discussed: Patient states she's been doing, "OK" since hosp d/c. Has had little SHOB with walking but denies SHOB at rest. States she's mostly been taking it easy since discharge. C/o abd pain directly across from umbilicus. Pain began yesterday following HD, woke her up this morning and has worsened throughout day. Rates 5-6/10 at present. Describes it as "sore, like a tooth ache." Pain is lessened with rest. Took Tylenol 20 minutes ago and states pain subsiding. States had this pain before when dialysis was not working.  States there has been "no blood in stools at all." Describes stools as light brown. Has had no difficulty eating. Reminded to avoid salting foods and use Mrs. Dash instead. States her nutritionist told her she can use [place] pink salt.  Reminded of TOC appt on 12/30/2016 at 3:30  with Dr.Parker. States he should not have any difficulty coming to this appt. Also reminded of upcoming appts with PCP and [facility].  Patient denies any questions or concerns at this time.   Patient Education: patient has access to scale in exercise room at [facility]. Advised to weigh daily with same amt of clothes upon waking and after voiding, record daily weights and bring log to all future appts. Patient states she will.           Collaborated with Preceptor, Dr. Ree Kida, regarding abd pain and patient given return precautions. Hubbard Hartshorn, RN, BSN

## 2016-12-29 ENCOUNTER — Telehealth: Payer: Self-pay | Admitting: *Deleted

## 2016-12-29 NOTE — Telephone Encounter (Signed)
Patient currently at dialysis. Left reminder for tomorrow's HFU appt with patient's sister, [name]. Hubbard Hartshorn, RN, BSN

## 2016-12-30 ENCOUNTER — Ambulatory Visit (INDEPENDENT_AMBULATORY_CARE_PROVIDER_SITE_OTHER): Payer: Medicare Other | Admitting: Family Medicine

## 2016-12-30 ENCOUNTER — Encounter: Payer: Self-pay | Admitting: Family Medicine

## 2016-12-30 VITALS — BP 148/64 | HR 56 | Temp 97.6°F | Ht 63.0 in | Wt 160.0 lb

## 2016-12-30 DIAGNOSIS — D649 Anemia, unspecified: Secondary | ICD-10-CM | POA: Diagnosis not present

## 2016-12-30 DIAGNOSIS — K922 Gastrointestinal hemorrhage, unspecified: Secondary | ICD-10-CM

## 2016-12-30 LAB — CBC
HCT: 27.9 % — ABNORMAL LOW (ref 35.0–45.0)
Hemoglobin: 8.9 g/dL — ABNORMAL LOW (ref 11.7–15.5)
MCH: 30.1 pg (ref 27.0–33.0)
MCHC: 31.9 g/dL — ABNORMAL LOW (ref 32.0–36.0)
MCV: 94.3 fL (ref 80.0–100.0)
MPV: 10.1 fL (ref 7.5–12.5)
PLATELETS: 205 10*3/uL (ref 140–400)
RBC: 2.96 MIL/uL — AB (ref 3.80–5.10)
RDW: 17 % — AB (ref 11.0–15.0)
WBC: 5.3 10*3/uL (ref 3.8–10.8)

## 2016-12-30 NOTE — Patient Instructions (Signed)
We will check blood work today.  Come back to see Dr [name] soon.  Take care,  Dr Jerline Pain

## 2016-12-30 NOTE — Assessment & Plan Note (Signed)
No signs of symptomatic anemia. No further signs of bleeding aside from minor bleeding related to hemorrhoids. Will check CBC today.

## 2016-12-30 NOTE — Progress Notes (Signed)
TRANSITION OF CARE VISIT   Primary Care Physician (PCP): Smiley Houseman, MD                                                    [facility]                                                   [address]                                                   [place], [place] [address]                                                   [place]    Date of Admission: 12/21/2016  Date of Discharge: 12/23/2016  Discharged from: [facility]  Discharge Diagnosis: GI Bleed  Summary of Admission: Patient admitted for GI bleed with an initial hgb of 5.6. GI was consulted and patient underwent colonoscopy which found diverticulosis and vascular ectasia which was cauterized. Patient received 3 total units of pRBC. Her hgb stabilized and she was discharged home. Patient was instructed to restart her home aspirin and brilinta 3 days after discharge.   TODAY's VISIT  Patient/Caregiver self-reported problems/concerns: None.  Doing well since discharge. Having a small amount of bright red blood when wiping after a BM which she relates to hemorrhoids. No dizziness, chest pain, palpitations, fatigue, or lightheadedness.  MEDICATIONS  Medication Reconciliation conducted with patient/caregiver? (Yes/ No):No  New medications prescribed/discontinued upon discharge? (Yes/No): No  Barriers identified related to medications: No  LABS  Lab Reviewed (Yes/No/NA): Yes  PHYSICAL EXAM:  Blood pressure (!) 148/64, pulse (!) 56, temperature 97.6 F (36.4 C), temperature source Oral, height 5\' 3"  (1.6 m), weight 160 lb (72.6 kg), SpO2 98 %. Gen: 66yo F in NAD sitting in chair HEENT: MMM, EOMI CV: RRR, no murmurs PULM: NWOB< CTAB ABD: S, NT, ND EXT: No cyanosis or edema Neuro: No gross deficits.   ASSESSMENT: GI bleed No signs of symptomatic anemia. No further signs of bleeding aside from minor bleeding related to hemorrhoids. Will check CBC  today.   PATIENT EDUCATION PROVIDED: See AVS   FOLLOW-UP (Include any further testing or referrals): in 2 weeks with PCP.

## 2017-01-06 ENCOUNTER — Telehealth: Payer: Self-pay | Admitting: Family Medicine

## 2017-01-06 NOTE — Telephone Encounter (Signed)
Attempted to call pt- phone just rang and rang no option for VM. If pt calls back please inform her her blood work is normal.

## 2017-01-06 NOTE — Telephone Encounter (Signed)
Called patient to discuss results. Patient's sister answered phone and patient was not available. Left message with call back number.   Hemoglobin stable. Patient should follow up with PCP next Monday as scheduled.   [name] [name]. Jerline Pain, [place] Resident PGY-3 01/06/2017 8:43 AM

## 2017-01-11 ENCOUNTER — Encounter: Payer: Self-pay | Admitting: Internal Medicine

## 2017-01-11 ENCOUNTER — Ambulatory Visit (INDEPENDENT_AMBULATORY_CARE_PROVIDER_SITE_OTHER): Payer: Medicare Other | Admitting: Internal Medicine

## 2017-01-11 VITALS — BP 138/80 | HR 62 | Temp 97.6°F | Wt 156.0 lb

## 2017-01-11 DIAGNOSIS — R634 Abnormal weight loss: Secondary | ICD-10-CM

## 2017-01-11 DIAGNOSIS — J90 Pleural effusion, not elsewhere classified: Secondary | ICD-10-CM | POA: Diagnosis not present

## 2017-01-11 DIAGNOSIS — D649 Anemia, unspecified: Secondary | ICD-10-CM

## 2017-01-11 DIAGNOSIS — E118 Type 2 diabetes mellitus with unspecified complications: Secondary | ICD-10-CM | POA: Diagnosis not present

## 2017-01-11 LAB — POCT GLYCOSYLATED HEMOGLOBIN (HGB A1C): HEMOGLOBIN A1C: 5.7

## 2017-01-11 LAB — POCT HEMOGLOBIN: HEMOGLOBIN: 8.2 g/dL — AB (ref 12.2–16.2)

## 2017-01-11 NOTE — Progress Notes (Signed)
   [facility] Phone: [phone]   Date of Visit: 01/11/2017   HPI:  Weight Loss:  - follow up visit for weight loss - reports that she noticed weight loss after moving to nursing home in Aug/September 2016 after her husband passed away  - since moving to the nursing home, she has her meals prepared by others, the meals are smaller in portion size, and she has been eating healthier. She is also exercising regularly as well. Before this, she used to cook her own food, eat less healthy, eat more sweets.  - reports she eats three meals a day. Sometimes would not have an appetite.  - at last visit TSH and ESR were normal.  - she still has not scheduled a mammogram  - up to date on colonoscopy and pap - reports she feels a little short of breath with ambulation since her discharge from the hospital earlier this month. Reports that her kidney doctors have been trying to get her fluid weight balanced again.  - weight today 156lb. Weight 151 in January, 160s in march 2017, 170s in Feb 2017, 180s in 08/2015, 190s in 04/2015  ROS: See HPI.  [place]: PMH:  Hx of Low Blood Pressure  CAD s/p stents in 01/2016, hx of unstable angina Ischemic Cardiomyopathy DMII with retinopathy  ESRD with anemia Chronic hx of N/V: 02/2013 moderate gastric emptying delay Depression with anxiety  PHYSICAL EXAM: BP 138/80   Pulse 62   Temp 97.6 F (36.4 C) (Oral)   Wt 156 lb (70.8 kg)   SpO2 96%   BMI 27.63 kg/m  GEN: NAD HEENT: Atraumatic, normocephalic, neck supple, EOMI, sclera clear  CV: RRR, no murmurs, rubs, or gallops PULM: CTAB, normal effort ABD: Soft, nontender, nondistended, NABS, no organomegaly SKIN: No rash or cyanosis; warm and well-perfused EXTR: trace LE edema to mid shin. No calf tenderness  PSYCH: Mood and affect euthymic, normal rate and volume of speech NEURO: Awake, alert, no focal deficits grossly, normal speech  ASSESSMENT/PLAN:  Health maintenance:  -  reminded to schedule mammogram  Weight Loss:  Initial blood work is unremarkable. Has history of recurrent left pleural effusion. Pleural fluid studies overall unremarkable except LDH ratio is 0.7. Unclear if this is significant in the setting that CMP was obtained a few weeks after the pleural fluid. Sent message to pulmonology again to discuss this [employer] Message sent 01/12/17). Discussed with preceptor. With this history and history of significant second had smoke, will go ahead and order CT chest to further evaluate for pulmonary malignancy. Asked patient to schedule mammogram. Otherwise, she is uptodate on cancer screening. Patient has a follow up visit with pulmonology on 01/13/17.  - follow up in 1 month   Smiley Houseman, MD PGY [place]

## 2017-01-11 NOTE — Patient Instructions (Signed)
We will get a CT of your chest.  Please check your sugar once a day  Please follow up in 1 month

## 2017-01-13 ENCOUNTER — Ambulatory Visit (INDEPENDENT_AMBULATORY_CARE_PROVIDER_SITE_OTHER): Payer: Medicare Other | Admitting: Emergency Medicine

## 2017-01-13 ENCOUNTER — Encounter: Payer: Self-pay | Admitting: Emergency Medicine

## 2017-01-13 DIAGNOSIS — R059 Cough, unspecified: Secondary | ICD-10-CM

## 2017-01-13 DIAGNOSIS — J90 Pleural effusion, not elsewhere classified: Secondary | ICD-10-CM

## 2017-01-13 DIAGNOSIS — R05 Cough: Secondary | ICD-10-CM

## 2017-01-13 LAB — PULMONARY FUNCTION TEST
DL/VA % pred: 79 %
DL/VA: 3.73 ml/min/mmHg/L
DLCO COR % PRED: 59 %
DLCO COR: 13.72 ml/min/mmHg
DLCO UNC % PRED: 52 %
DLCO UNC: 11.92 ml/min/mmHg
FEF 25-75 POST: 1.95 L/s
FEF 25-75 PRE: 1.99 L/s
FEF2575-%Change-Post: -1 %
FEF2575-%PRED-POST: 99 %
FEF2575-%PRED-PRE: 100 %
FEV1-%CHANGE-POST: 0 %
FEV1-%PRED-POST: 71 %
FEV1-%Pred-Pre: 70 %
FEV1-POST: 1.61 L
FEV1-Pre: 1.6 L
FEV1FVC-%Change-Post: 3 %
FEV1FVC-%PRED-PRE: 109 %
FEV6-%Change-Post: -3 %
FEV6-%PRED-POST: 65 %
FEV6-%Pred-Pre: 67 %
FEV6-POST: 1.86 L
FEV6-Pre: 1.92 L
FEV6FVC-%CHANGE-POST: 0 %
FEV6FVC-%PRED-POST: 103 %
FEV6FVC-%Pred-Pre: 104 %
FVC-%CHANGE-POST: -2 %
FVC-%Pred-Post: 62 %
FVC-%Pred-Pre: 64 %
FVC-Post: 1.86 L
FVC-Pre: 1.92 L
PRE FEV1/FVC RATIO: 83 %
PRE FEV6/FVC RATIO: 100 %
Post FEV1/FVC ratio: 86 %
Post FEV6/FVC ratio: 100 %
RV % pred: 67 %
RV: 1.39 L
TLC % PRED: 67 %
TLC: 3.3 L

## 2017-01-13 NOTE — Assessment & Plan Note (Signed)
Her pulmonary function testing does not show significant evidence for obstruction. I suspect that her chronic cough is related to GERD, rhinitis, probably also to the recurrent effusion. For now I believe we can try to stop the [facility], see if she tolerates this. Continue to treat her rhinitis and GERD. Tessalon Perles when necessary. Depending on Ct chest and the pesistance of her sx we may decide to perform FOB for airway inspection.

## 2017-01-13 NOTE — Assessment & Plan Note (Signed)
I suspect that her pleural effusion is a pseudo-exudate, the result of recurrent HD, repeat thoracenteses. I agree with a CT scan of the chest as planned by Dr [name]. We can review this, decide whether to pursue FOB to inspect airways. The effusion will likely continue to recur, will be best addressed with volume status, HD.

## 2017-01-13 NOTE — Patient Instructions (Addendum)
We will try stopping Dulera to see how your do of of it. Call if your cough or breathing worsen off of this medication Get your CT chest as planned Continue to use your other medications including your protonix and loratadine. Continue tessalon perles as needed for cough.  We will follow up next available to review your CT scan and assess your symptoms

## 2017-01-13 NOTE — Progress Notes (Signed)
Subjective:    Patient ID: [name] [name], female    DOB: 02/02/1951, 66 y.o.   MRN: [id number]  HPI 66 year old woman, never smoker, with a history of end-stage renal disease on HD (TThS), diabetes, coronary artery disease with associated ischemic cardiomyopathy, chronic left pleural effusion most recently tapped on 11/30/16 and consistent with a transudate based on low protein and LDH. She has been dealing with chronic cough and associated dyspnea for over 1.5 years. Occasionally she had post-tussive emesis. She feels that her throat is dry, has a globus sensation. She does have some nasal gtt and congestion, seems to come and go. Still has some occasional breakthrough GERD sx.   Treated with Protonix, loratadine, fluticasone nasal spray without much improvement.  PFT's have been ordered but not yet done. She was started on [facility] recently and believes the cough and the dyspnea are somewhat improved. The recent thoracentesis helped her breathing but did not change her cough. She occasionally feel that she gets choked with food / drink. Occasionally has trouble swallowing.   ROV 01/13/17 -- Patient with a history of end-stage renal disease, diabetes, coronary disease with an ischemic cardiomyopathy and an associated chronic left pleural effusion. I saw her in January for a chronic cough. We tried briefly increasing her Protonix to twice a day. Unfortunately  since that visit she was admitted to the hospital with anemia in the setting of GI blood loss. She was found to have diverticulosis as well as gastric antral vascular ectasia. She underwent cauterization, continued on Protonix once a day. She underwent pulmonary function testing today that I have personally reviewed. This was most consistent with restrictive lung disease on spirometry and lung volumes. Her diffusion capacity was decreased and corrected to the normal range when adjusted for alveolar volume.  Pleural fluid analysis of her most recent  thoracentesis was suggestive of a mild exudate. She has benefited from [place].          Review of Systems  Constitutional: Negative for fever and unexpected weight change.  HENT: Negative for congestion, dental problem, ear pain, nosebleeds, postnasal drip, rhinorrhea, sinus pressure, sneezing, sore throat and trouble swallowing.   Eyes: Negative for redness and itching.  Respiratory: Positive for cough and shortness of breath. Negative for chest tightness and wheezing.   Cardiovascular: Negative for palpitations and leg swelling.  Gastrointestinal: Negative for nausea and vomiting.  Genitourinary: Negative for dysuria.  Musculoskeletal: Negative for joint swelling.  Skin: Negative for rash.  Neurological: Negative for headaches.  Hematological: Does not bruise/bleed easily.  Psychiatric/Behavioral: Negative for dysphoric mood. The patient is not nervous/anxious.     Past Medical History:  Diagnosis Date  . Anemia   . Coronary artery disease    a. 6/16 Cath: Severe multivessel dzs->not surg candidate 2/2 debility/poor venous conduits->Med Rx; b. 01/2016 Cath/PCI: LM 50ost, LAD 71m (2.5x32 & 2.5x20 Promus Prem DES'), D1 70ost, D2 90, LCX 95p/m (3.0x16 Promus Prem DES), 65d, RCA 60p->d, RPDA 95 (2.5x12 Promus Prem DES).  . Diabetes mellitus ([place])   . ESRD (end stage renal disease) on dialysis ([place])       . Hearing difficulty    Mild   . Hyperlipidemia   . Hypertension   . Hyponatremia   . Ischemic cardiomyopathy    a. Echo 04/2015: EF 35%. Cath same time - 45%.  . Memory loss   . Syncope   . Thrombocytopenia ([place])      Family History  Problem Relation Age of  Onset  . Bone cancer Father     Of jaws   . Throat cancer Father   . Heart attack Father   . Heart disease    . Diabetes    . Stroke    . Heart attack Sister   . Stroke Sister   . Stroke Brother      Social History   Social History  . Marital status: Widowed    Spouse name: N/A  . Number of children:  N/A  . Years of education: N/A   Occupational History  . Not on file.   Social History Main Topics  . Smoking status: Never Smoker  . Smokeless tobacco: Never Used  . Alcohol use No  . Drug use: No  . Sexual activity: Not Currently    Birth control/ protection: Abstinence   Other Topics Concern  . Not on file   Social History Narrative      Denies alcohol, tobacco (never smoked), other drugs   Hobbies: reading           Allergies  Allergen Reactions  . Aleve [Naproxen Sodium] Other (See Comments)    Due to kidneys.  . Fentanyl Other (See Comments)    Facial swelling   . Motrin [Ibuprofen] Other (See Comments)    Due to kidneys.  . Nsaids Other (See Comments)    KIDNEY FAILURE AND ONLY HAS PARTIAL LEFT KIDNEY REMAINING (RIGHT ONE TAKEN OUT)  . Tape Other (See Comments)    Please use "paper" tape  . Contrast Media [Iodinated Diagnostic Agents] Itching     Outpatient Medications Prior to Visit  Medication Sig Dispense Refill  . acetaminophen (TYLENOL) 500 MG tablet Take 500 mg by mouth every 6 (six) hours as needed for headache.    . albuterol (PROVENTIL HFA;VENTOLIN HFA) 108 (90 Base) MCG/ACT inhaler Inhale 2 puffs into the lungs every 4 (four) hours as needed for wheezing or shortness of breath. 1 Inhaler 5  . aspirin 81 MG EC tablet Take 1 tablet (81 mg total) by mouth daily. 30 tablet 0  . atorvastatin (LIPITOR) 40 MG tablet TAKE ONE TABLET BY MOUTH IN THE MORNING 30 tablet 2  . benzonatate (TESSALON) 200 MG capsule Take 1 capsule (200 mg total) by mouth 3 (three) times daily as needed for cough. 90 capsule 1  . calcium carbonate (OS-CAL - DOSED IN MG OF ELEMENTAL CALCIUM) 1250 (500 CA) MG tablet Take two (2) tablets by mouth three (3) times daily with meals and take two (2) tablets by mouth with snacks.    . DULERA 200-5 MCG/ACT AERO INHALE TWO PUFFS BY MOUTH TWICE DAILY AT  8  AM  AND  10  PM 1 Inhaler 1  . loratadine (CLARITIN) 10 MG tablet Take 10 mg by mouth  daily.    . midodrine (PROAMATINE) 10 MG tablet Take one (1) tablet (10 mg total) by mouth at bedtime on Mondays, Wednesdays, and Fridays. Take one (1) tablet (10 mg total) by mouth pre-dialysis on Tuesdays, Thursdays, and Saturdays.  3  . multivitamin (RENA-VIT) TABS tablet Take 1 tablet by mouth at bedtime. 90 tablet 3  . nitroGLYCERIN (NITROSTAT) 0.4 MG SL tablet Place 1 tablet (0.4 mg total) under the tongue every 5 (five) minutes as needed for chest pain. 5 tablet 3  . pantoprazole (PROTONIX) 40 MG tablet Take 1 tablet (40 mg total) by mouth 2 (two) times daily. 60 tablet 5  . promethazine (PHENERGAN) 25 MG tablet Take 1 tablet (25  mg total) by mouth every 6 (six) hours as needed for nausea or vomiting. 30 tablet 0  . sevelamer carbonate (RENVELA) 2.4 g PACK Take two (2) packets by mouth with each meal. Twice daily on dialysis days and Three times daily on non-dialysis days.  0  . ticagrelor (BRILINTA) 90 MG TABS tablet Take 1 tablet (90 mg total) by mouth 2 (two) times daily. 30 tablet 0   No facility-administered medications prior to visit.         Objective:   Physical Exam Vitals:   01/13/17 1445  BP: 130/80  Pulse: 60  SpO2: 96%  Weight: 158 lb (71.7 kg)  Height: 5\' 3"  (1.6 m)  Gen: Pleasant, kyphotic elderly woman, in no distress,  normal affect  ENT: No lesions,  mouth clear,  oropharynx clear, no postnasal drip  Neck: No JVD, no TMG, no carotid bruits  Lungs: No use of accessory muscles, clear without rales or rhonchi  Cardiovascular: RRR, heart sounds normal, no murmur or gallops, trace pretibial peripheral edema  Musculoskeletal: No deformities, no cyanosis or clubbing  Neuro: alert, non focal  Skin: Warm, no lesions or rashes     Assessment & Plan:  Cough Her pulmonary function testing does not show significant evidence for obstruction. I suspect that her chronic cough is related to GERD, rhinitis, probably also to the recurrent effusion. For now I believe we  can try to stop the [facility], see if she tolerates this. Continue to treat her rhinitis and GERD. Tessalon Perles when necessary. Depending on Ct chest and the pesistance of her sx we may decide to perform FOB for airway inspection.   Pleural effusion I suspect that her pleural effusion is a pseudo-exudate, the result of recurrent HD, repeat thoracenteses. I agree with a CT scan of the chest as planned by Dr [name]. We can review this, decide whether to pursue FOB to inspect airways. The effusion will likely continue to recur, will be best addressed with volume status, HD.   Baltazar Apo, MD, PhD 01/13/2017, 3:20 PM [facility] [phone] or if no answer [phone]

## 2017-01-15 ENCOUNTER — Ambulatory Visit (HOSPITAL_COMMUNITY)
Admission: RE | Admit: 2017-01-15 | Discharge: 2017-01-15 | Disposition: A | Discharge status: Home / Self Care | Payer: Medicare Other | Source: Ambulatory Visit | Attending: Family Medicine | Admitting: Family Medicine

## 2017-01-15 ENCOUNTER — Encounter (HOSPITAL_COMMUNITY): Payer: Self-pay

## 2017-01-15 DIAGNOSIS — Z905 Acquired absence of kidney: Secondary | ICD-10-CM | POA: Diagnosis not present

## 2017-01-15 DIAGNOSIS — J90 Pleural effusion, not elsewhere classified: Secondary | ICD-10-CM | POA: Diagnosis not present

## 2017-01-15 DIAGNOSIS — N186 End stage renal disease: Secondary | ICD-10-CM | POA: Insufficient documentation

## 2017-01-15 DIAGNOSIS — R911 Solitary pulmonary nodule: Secondary | ICD-10-CM | POA: Diagnosis not present

## 2017-01-15 DIAGNOSIS — I517 Cardiomegaly: Secondary | ICD-10-CM | POA: Insufficient documentation

## 2017-01-15 DIAGNOSIS — M47894 Other spondylosis, thoracic region: Secondary | ICD-10-CM | POA: Insufficient documentation

## 2017-01-15 DIAGNOSIS — I2510 Atherosclerotic heart disease of native coronary artery without angina pectoris: Secondary | ICD-10-CM | POA: Insufficient documentation

## 2017-01-15 DIAGNOSIS — R188 Other ascites: Secondary | ICD-10-CM | POA: Insufficient documentation

## 2017-01-15 DIAGNOSIS — I700 Atherosclerosis of aorta: Secondary | ICD-10-CM | POA: Insufficient documentation

## 2017-01-15 DIAGNOSIS — Z992 Dependence on renal dialysis: Secondary | ICD-10-CM | POA: Diagnosis not present

## 2017-01-15 DIAGNOSIS — R634 Abnormal weight loss: Secondary | ICD-10-CM | POA: Diagnosis present

## 2017-01-21 ENCOUNTER — Telehealth: Payer: Self-pay | Admitting: Internal Medicine

## 2017-01-21 NOTE — Telephone Encounter (Signed)
Copy of Message from Dr. Lamonte Sakai (pulmonology):  I agree with getting the CT chest, and have had a chance to review it. The effusion has not significantly reaccumulated yet. She has two distinct L lung opacities that are most consistent with rounded atelectasis by appearance. I do not see an endobronchial lesion or any evidence for a post-obstructive process.   It would be reasonable to follow these 2 opacities w a repeat CT chest in 3-6 months to insure stability. Alternatively a more aggressive approach would be to obtain a PET scan and try to better characterize the opacities now. Given the recurrent compressive atelectasis on the L due to effusion, rounded atelectasis would make sense.    Called patient to discuss CT chest results. Discussed options as noted above. Patient would like to think about this first.

## 2017-02-19 ENCOUNTER — Other Ambulatory Visit: Payer: Self-pay | Admitting: Nephrology

## 2017-02-19 ENCOUNTER — Ambulatory Visit
Admission: RE | Admit: 2017-02-19 | Discharge: 2017-02-19 | Disposition: A | Discharge status: Home / Self Care | Payer: Medicare Other | Source: Ambulatory Visit | Attending: Nephrology | Admitting: Nephrology

## 2017-02-19 DIAGNOSIS — R509 Fever, unspecified: Secondary | ICD-10-CM

## 2017-02-19 DIAGNOSIS — R059 Cough, unspecified: Secondary | ICD-10-CM

## 2017-02-19 DIAGNOSIS — R05 Cough: Secondary | ICD-10-CM

## 2017-03-05 ENCOUNTER — Other Ambulatory Visit: Payer: Self-pay | Admitting: Family Medicine

## 2017-03-08 ENCOUNTER — Ambulatory Visit (INDEPENDENT_AMBULATORY_CARE_PROVIDER_SITE_OTHER): Payer: Medicare Other | Admitting: Emergency Medicine

## 2017-03-08 ENCOUNTER — Encounter: Payer: Self-pay | Admitting: Emergency Medicine

## 2017-03-08 ENCOUNTER — Ambulatory Visit: Payer: Medicare Other | Admitting: Emergency Medicine

## 2017-03-08 DIAGNOSIS — J90 Pleural effusion, not elsewhere classified: Secondary | ICD-10-CM

## 2017-03-08 DIAGNOSIS — J9811 Atelectasis: Secondary | ICD-10-CM | POA: Insufficient documentation

## 2017-03-08 DIAGNOSIS — R918 Other nonspecific abnormal finding of lung field: Secondary | ICD-10-CM | POA: Diagnosis not present

## 2017-03-08 MED ORDER — LORATADINE 10 MG PO TABS: 10.0000 mg | ORAL_TABLET | Freq: Every day | ORAL | 5 refills | Status: DC

## 2017-03-08 NOTE — Assessment & Plan Note (Addendum)
Mild exudate on chemistries, ? parapneumonic. Associated with a rounded opacity, ? atx. More likely due to her volume status and ESRD.

## 2017-03-08 NOTE — Progress Notes (Signed)
Subjective:    Patient ID: [name] [name], female    DOB: 03/26/1951, 66 y.o.   MRN: [id number]  HPI 66 year old woman, never smoker, with a history of end-stage renal disease on HD (TThS), diabetes, coronary artery disease with associated ischemic cardiomyopathy, chronic left pleural effusion most recently tapped on 11/30/16 and consistent with a transudate based on low protein and LDH. She has been dealing with chronic cough and associated dyspnea for over 1.5 years. Occasionally she had post-tussive emesis. She feels that her throat is dry, has a globus sensation. She does have some nasal gtt and congestion, seems to come and go. Still has some occasional breakthrough GERD sx.   Treated with Protonix, loratadine, fluticasone nasal spray without much improvement.  PFT's have been ordered but not yet done. She was started on [facility] recently and believes the cough and the dyspnea are somewhat improved. The recent thoracentesis helped her breathing but did not change her cough. She occasionally feel that she gets choked with food / drink. Occasionally has trouble swallowing.   ROV 01/13/17 -- Patient with a history of end-stage renal disease, diabetes, coronary disease with an ischemic cardiomyopathy and an associated chronic left pleural effusion. I saw her in January for a chronic cough. We tried briefly increasing her Protonix to twice a day. Unfortunately  since that visit she was admitted to the hospital with anemia in the setting of GI blood loss. She was found to have diverticulosis as well as gastric antral vascular ectasia. She underwent cauterization, continued on Protonix once a day. She underwent pulmonary function testing today that I have personally reviewed. This was most consistent with restrictive lung disease on spirometry and lung volumes. Her diffusion capacity was decreased and corrected to the normal range when adjusted for alveolar volume.  Pleural fluid analysis of her most recent  thoracentesis was suggestive of a mild exudate. She has benefited from [place].       ROV 03/08/17 --   this follow-up visit for patient with a history of restrictive lung disease in the setting of a chronic left pleural effusion, cough, end-stage renal disease, diabetes, coronary artery disease with ischemic cardiomyopathy. Areas of chronic rounded atelectasis opacities on CT scan of the chest. She was treated for a possible recurrent LLL PNA in setting cough and mucous early April. She was temporarily better, but now has a dry cough. She is on flonase, has been using prn mucinex. She uses tessalon perles prn. Notes that her cough increases w HD and volume removal. We stopped Dulera, she has albuterol to use prn - she believe that it does help her some, uses it.    Review of Systems  Constitutional: Negative for fever and unexpected weight change.  HENT: Negative for congestion, dental problem, ear pain, nosebleeds, postnasal drip, rhinorrhea, sinus pressure, sneezing, sore throat and trouble swallowing.   Eyes: Negative for redness and itching.  Respiratory: Positive for cough and shortness of breath. Negative for chest tightness and wheezing.   Cardiovascular: Negative for palpitations and leg swelling.  Gastrointestinal: Negative for nausea and vomiting.  Genitourinary: Negative for dysuria.  Musculoskeletal: Negative for joint swelling.  Skin: Negative for rash.  Neurological: Negative for headaches.  Hematological: Does not bruise/bleed easily.  Psychiatric/Behavioral: Negative for dysphoric mood. The patient is not nervous/anxious.     Past Medical History:  Diagnosis Date  . Anemia   . Coronary artery disease    a. 6/16 Cath: Severe multivessel dzs->not surg candidate 2/2 debility/poor  venous conduits->Med Rx; b. 01/2016 Cath/PCI: LM 50ost, LAD 21m (2.5x32 & 2.5x20 Promus Prem DES'), D1 70ost, D2 90, LCX 95p/m (3.0x16 Promus Prem DES), 65d, RCA 60p->d, RPDA 95 (2.5x12 Promus  Prem DES).  . Diabetes mellitus ([place])   . ESRD (end stage renal disease) on dialysis ([place])       . Hearing difficulty    Mild   . Hyperlipidemia   . Hypertension   . Hyponatremia   . Ischemic cardiomyopathy    a. Echo 04/2015: EF 35%. Cath same time - 45%.  . Memory loss   . Syncope   . Thrombocytopenia ([place])      Family History  Problem Relation Age of Onset  . Bone cancer Father     Of jaws   . Throat cancer Father   . Heart attack Father   . Heart disease    . Diabetes    . Stroke    . Heart attack Sister   . Stroke Sister   . Stroke Brother      Social History   Social History  . Marital status: Widowed    Spouse name: N/A  . Number of children: N/A  . Years of education: N/A   Occupational History  . Not on file.   Social History Main Topics  . Smoking status: Never Smoker  . Smokeless tobacco: Never Used  . Alcohol use No  . Drug use: No  . Sexual activity: Not Currently    Birth control/ protection: Abstinence   Other Topics Concern  . Not on file   Social History Narrative      Denies alcohol, tobacco (never smoked), other drugs   Hobbies: reading           Allergies  Allergen Reactions  . Aleve [Naproxen Sodium] Other (See Comments)    Due to kidneys.  . Fentanyl Other (See Comments)    Facial swelling   . Motrin [Ibuprofen] Other (See Comments)    Due to kidneys.  . Nsaids Other (See Comments)    KIDNEY FAILURE AND ONLY HAS PARTIAL LEFT KIDNEY REMAINING (RIGHT ONE TAKEN OUT)  . Tape Other (See Comments)    Please use "paper" tape  . Contrast Media [Iodinated Diagnostic Agents] Itching     Outpatient Medications Prior to Visit  Medication Sig Dispense Refill  . acetaminophen (TYLENOL) 500 MG tablet Take 500 mg by mouth every 6 (six) hours as needed for headache.    . albuterol (PROVENTIL HFA;VENTOLIN HFA) 108 (90 Base) MCG/ACT inhaler Inhale 2 puffs into the lungs every 4 (four) hours as needed for wheezing or shortness of breath.  1 Inhaler 5  . aspirin 81 MG EC tablet Take 1 tablet (81 mg total) by mouth daily. 30 tablet 0  . atorvastatin (LIPITOR) 40 MG tablet TAKE ONE TABLET BY MOUTH IN THE MORNING 30 tablet 2  . benzonatate (TESSALON) 200 MG capsule Take 1 capsule (200 mg total) by mouth 3 (three) times daily as needed for cough. 90 capsule 1  . calcium carbonate (OS-CAL - DOSED IN MG OF ELEMENTAL CALCIUM) 1250 (500 CA) MG tablet Take two (2) tablets by mouth three (3) times daily with meals and take two (2) tablets by mouth with snacks.    . DULERA 200-5 MCG/ACT AERO INHALE TWO PUFFS BY MOUTH TWICE DAILY AT  8  AM  AND  10  PM 1 Inhaler 1  . EQ ALLERGY RELIEF 10 MG tablet TAKE ONE TABLET BY MOUTH ONCE  DAILY 30 tablet 10  . midodrine (PROAMATINE) 10 MG tablet Take one (1) tablet (10 mg total) by mouth at bedtime on Mondays, Wednesdays, and Fridays. Take one (1) tablet (10 mg total) by mouth pre-dialysis on Tuesdays, Thursdays, and Saturdays.  3  . multivitamin (RENA-VIT) TABS tablet Take 1 tablet by mouth at bedtime. 90 tablet 3  . nitroGLYCERIN (NITROSTAT) 0.4 MG SL tablet Place 1 tablet (0.4 mg total) under the tongue every 5 (five) minutes as needed for chest pain. 5 tablet 3  . pantoprazole (PROTONIX) 40 MG tablet Take 1 tablet (40 mg total) by mouth 2 (two) times daily. 60 tablet 5  . promethazine (PHENERGAN) 25 MG tablet Take 1 tablet (25 mg total) by mouth every 6 (six) hours as needed for nausea or vomiting. 30 tablet 0  . sevelamer carbonate (RENVELA) 2.4 g PACK Take two (2) packets by mouth with each meal. Twice daily on dialysis days and Three times daily on non-dialysis days.  0  . ticagrelor (BRILINTA) 90 MG TABS tablet Take 1 tablet (90 mg total) by mouth 2 (two) times daily. 30 tablet 0   No facility-administered medications prior to visit.         Objective:   Physical Exam Vitals:   03/08/17 1055  BP: 138/70  Pulse: 61  SpO2: 94%  Weight: 153 lb 9.6 oz (69.7 kg)  Height: 5\' 3"  (1.6 m)  Gen:  Pleasant, kyphotic elderly woman, in no distress,  normal affect  ENT: No lesions,  mouth clear,  oropharynx clear, no postnasal drip  Neck: No JVD, no TMG, no carotid bruits  Lungs: No use of accessory muscles, clear without rales or rhonchi  Cardiovascular: RRR, heart sounds normal, no murmur or gallops, trace pretibial peripheral edema  Musculoskeletal: No deformities, no cyanosis or clubbing  Neuro: alert, non focal  Skin: Warm, no lesions or rashes     Assessment & Plan:  Pleural effusion Mild exudate on chemistries, ? parapneumonic. Associated with a rounded opacity, ? atx. More likely due to her volume status and ESRD.   Atelectasis LLL rounded opacity ? atx from either effusion or prior PNA. Discussed options for eval with her. We have decide to perform a PET scan to better risk assess. Will determine whether bx is indicated vs serial CT's based on the PET results.   Cough She was treated for posible PNA since last time. She continues to have cough that I suspect is being driven by PND. Try to ramp up allergy regimen, use tessalon prn.   Start using loratadine 10mg  daily Continue your flonase 2 sprays each nostril daily Continue your mucinex.  Keep albuterol available to use 2 puffs up to every 4 hours if needed for shortness of breath.  Use tessalon perles to suppress your cough as needed.   Baltazar Apo, MD, PhD 03/08/2017, 11:37 AM [facility] [phone] or if no answer [phone]

## 2017-03-08 NOTE — Assessment & Plan Note (Signed)
LLL rounded opacity ? atx from either effusion or prior PNA. Discussed options for eval with her. We have decide to perform a PET scan to better risk assess. Will determine whether bx is indicated vs serial CT's based on the PET results.

## 2017-03-08 NOTE — Patient Instructions (Addendum)
We will perform a PET scan to better evaluate your left lung opacity.  Start using loratadine 10mg  daily Continue your flonase 2 sprays each nostril daily Continue your mucinex.  Keep albuterol available to use 2 puffs up to every 4 hours if needed for shortness of breath.  Use tessalon perles to suppress your cough as needed.  Follow with Dr Lamonte Sakai next available to review your PET scan together. ]

## 2017-03-08 NOTE — Assessment & Plan Note (Signed)
She was treated for posible PNA since last time. She continues to have cough that I suspect is being driven by PND. Try to ramp up allergy regimen, use tessalon prn.   Start using loratadine 10mg  daily Continue your flonase 2 sprays each nostril daily Continue your mucinex.  Keep albuterol available to use 2 puffs up to every 4 hours if needed for shortness of breath.  Use tessalon perles to suppress your cough as needed.

## 2017-03-17 ENCOUNTER — Ambulatory Visit (HOSPITAL_COMMUNITY)
Admission: RE | Admit: 2017-03-17 | Discharge: 2017-03-17 | Disposition: A | Discharge status: Home / Self Care | Payer: Medicare Other | Source: Ambulatory Visit | Attending: Emergency Medicine | Admitting: Emergency Medicine

## 2017-03-17 DIAGNOSIS — J9811 Atelectasis: Secondary | ICD-10-CM | POA: Insufficient documentation

## 2017-03-17 DIAGNOSIS — I700 Atherosclerosis of aorta: Secondary | ICD-10-CM | POA: Insufficient documentation

## 2017-03-17 DIAGNOSIS — R918 Other nonspecific abnormal finding of lung field: Secondary | ICD-10-CM | POA: Insufficient documentation

## 2017-03-17 DIAGNOSIS — J181 Lobar pneumonia, unspecified organism: Secondary | ICD-10-CM | POA: Diagnosis not present

## 2017-03-17 DIAGNOSIS — R188 Other ascites: Secondary | ICD-10-CM | POA: Diagnosis not present

## 2017-03-17 DIAGNOSIS — K8020 Calculus of gallbladder without cholecystitis without obstruction: Secondary | ICD-10-CM | POA: Diagnosis not present

## 2017-03-17 LAB — GLUCOSE, CAPILLARY: Glucose-Capillary: 103 mg/dL — ABNORMAL HIGH (ref 65–99)

## 2017-03-17 MED ORDER — FLUDEOXYGLUCOSE F - 18 (FDG) INJECTION
7.5700 | Freq: Once | INTRAVENOUS | Status: AC | PRN
  Administered 2017-03-17: 7.57 via INTRAVENOUS

## 2017-04-02 ENCOUNTER — Encounter: Payer: Self-pay | Admitting: Adult Health

## 2017-04-02 ENCOUNTER — Ambulatory Visit (INDEPENDENT_AMBULATORY_CARE_PROVIDER_SITE_OTHER): Payer: Medicare Other | Admitting: Adult Health

## 2017-04-02 DIAGNOSIS — R05 Cough: Secondary | ICD-10-CM

## 2017-04-02 DIAGNOSIS — J9811 Atelectasis: Secondary | ICD-10-CM | POA: Diagnosis not present

## 2017-04-02 DIAGNOSIS — R053 Chronic cough: Secondary | ICD-10-CM

## 2017-04-02 NOTE — Assessment & Plan Note (Signed)
LLL consolidation /atx -perstent with effusion  Neg on PET scan - c/w benign etiology  She does have some new area on PET scan in [place] but is asx and new are `~ 1 month interval and only mild hypermetabolism  Will follow on serial CT chest  Repeat in 4 months .

## 2017-04-02 NOTE — Progress Notes (Signed)
@Patient  ID: [name] [name], female    DOB: Dec 11, 1950, 66 y.o.   MRN: [id number]  Chief Complaint  Patient presents with  . Follow-up    lung nodule     Referring provider: Smiley Houseman, MD  HPI: 66 year old female never smoker followed for restrictive lung disease in the setting of a chronic left pleural effusion , most recently tapped on 11/30/2016 consistent with a transudate of effusion based on low protein, LDH. Patient has a chronic cough for several years, She has end-stage renal disease on HD , diabetes and coronary disease with 6 chemotherapy cardiomyopathy  04/02/2017 Follow up  ;Lung nodule /PET scan /Chronic effusion  Patient returns for a one-month follow-up. Patient was treated for possible recurrent left lower lobe pneumonia. CT chest showed a mass like consolidation in the left lower lobe with associated volume loss. And a small left pleural effusion. Chest x-ray in April showed persistent left lower lobe consolidation. Patient was set up for a PET scan done on 03/17/2017 that showed benign rounded atelectasis in the left lower lobe with an adjacent small fibrothorax with no associated abnormal hypermetabolism. There was a new area of consolidation. The right lower lobe. Was considered to be either infectious or inflammatory. Given that it had appeared in such a short interval. Patient denies any fever, increased cough, congestion, or discolored mucus.. I discussed these results with patient and her family member. Patient does have a chronic cough. She has been treated for potential allergic rhinitis and reflux triggers. She is on Protonix, Claritin and Flonase. She remains on Dulera twice daily.      Allergies  Allergen Reactions  . Aleve [Naproxen Sodium] Other (See Comments)    Due to kidneys.  . Fentanyl Other (See Comments)    Facial swelling   . Motrin [Ibuprofen] Other (See Comments)    Due to kidneys.  . Nsaids Other (See Comments)    KIDNEY  FAILURE AND ONLY HAS PARTIAL LEFT KIDNEY REMAINING (RIGHT ONE TAKEN OUT)  . Tape Other (See Comments)    Please use "paper" tape  . Contrast Media [Iodinated Diagnostic Agents] Itching    Immunization History  Administered Date(s) Administered  . Influenza Split 08/28/2011  . Influenza,inj,Quad PF,36+ Mos 08/16/2016  . PPD Test 04/30/2015  . Td 11/18/2014    Past Medical History:  Diagnosis Date  . Anemia   . Coronary artery disease    a. 6/16 Cath: Severe multivessel dzs->not surg candidate 2/2 debility/poor venous conduits->Med Rx; b. 01/2016 Cath/PCI: LM 50ost, LAD 12m (2.5x32 & 2.5x20 Promus Prem DES'), D1 70ost, D2 90, LCX 95p/m (3.0x16 Promus Prem DES), 65d, RCA 60p->d, RPDA 95 (2.5x12 Promus Prem DES).  . Diabetes mellitus ([place])   . ESRD (end stage renal disease) on dialysis ([place])       . Hearing difficulty    Mild   . Hyperlipidemia   . Hypertension   . Hyponatremia   . Ischemic cardiomyopathy    a. Echo 04/2015: EF 35%. Cath same time - 45%.  . Memory loss   . Syncope   . Thrombocytopenia ([place])     Tobacco History: History  Smoking Status  . Never Smoker  Smokeless Tobacco  . Never Used   Counseling given: Not Answered   Outpatient Encounter Prescriptions as of 04/02/2017  Medication Sig  . acetaminophen (TYLENOL) 500 MG tablet Take 500 mg by mouth every 6 (six) hours as needed for headache.  . albuterol (PROVENTIL HFA;VENTOLIN HFA) 108 (90 Base)  MCG/ACT inhaler Inhale 2 puffs into the lungs every 4 (four) hours as needed for wheezing or shortness of breath.  Marland Kitchen aspirin 81 MG EC tablet Take 1 tablet (81 mg total) by mouth daily.  Marland Kitchen atorvastatin (LIPITOR) 40 MG tablet TAKE ONE TABLET BY MOUTH IN THE MORNING  . benzonatate (TESSALON) 200 MG capsule Take 1 capsule (200 mg total) by mouth 3 (three) times daily as needed for cough.  . calcium carbonate (OS-CAL - DOSED IN MG OF ELEMENTAL CALCIUM) 1250 (500 CA) MG tablet Take two (2) tablets by mouth three (3) times  daily with meals and take two (2) tablets by mouth with snacks.  . DULERA 200-5 MCG/ACT AERO INHALE TWO PUFFS BY MOUTH TWICE DAILY AT  8  AM  AND  10  PM  . EQ ALLERGY RELIEF 10 MG tablet TAKE ONE TABLET BY MOUTH ONCE DAILY  . loratadine (CLARITIN) 10 MG tablet Take 1 tablet (10 mg total) by mouth daily.  . midodrine (PROAMATINE) 10 MG tablet Take one (1) tablet (10 mg total) by mouth at bedtime on Mondays, Wednesdays, and Fridays. Take one (1) tablet (10 mg total) by mouth pre-dialysis on Tuesdays, Thursdays, and Saturdays.  . multivitamin (RENA-VIT) TABS tablet Take 1 tablet by mouth at bedtime.  . nitroGLYCERIN (NITROSTAT) 0.4 MG SL tablet Place 1 tablet (0.4 mg total) under the tongue every 5 (five) minutes as needed for chest pain.  . pantoprazole (PROTONIX) 40 MG tablet Take 1 tablet (40 mg total) by mouth 2 (two) times daily.  . promethazine (PHENERGAN) 25 MG tablet Take 1 tablet (25 mg total) by mouth every 6 (six) hours as needed for nausea or vomiting.  . sevelamer carbonate (RENVELA) 2.4 g PACK Take two (2) packets by mouth with each meal. Twice daily on dialysis days and Three times daily on non-dialysis days.  . ticagrelor (BRILINTA) 90 MG TABS tablet Take 1 tablet (90 mg total) by mouth 2 (two) times daily.   No facility-administered encounter medications on file as of 04/02/2017.      Review of Systems  Constitutional:   No  weight loss, night sweats,  Fevers, chills, + fatigue, or  lassitude.  HEENT:   No headaches,  Difficulty swallowing,  Tooth/dental problems, or  Sore throat,                No sneezing, itching, ear ache, + nasal congestion, post nasal drip,   CV:  No chest pain,  Orthopnea, PND, swelling in lower extremities, anasarca, dizziness, palpitations, syncope.   GI  No heartburn, indigestion, abdominal pain, nausea, vomiting, diarrhea, change in bowel habits, loss of appetite, bloody stools.   Resp: .  No chest wall deformity  Skin: no rash or  lesions.  GU: no dysuria, change in color of urine, no urgency or frequency.  No flank pain, no hematuria   MS:  No joint pain or swelling.  No decreased range of motion.  No back pain.    Physical Exam  BP 104/64 (BP Location: Right Arm, Cuff Size: Normal)   Pulse 62   Ht 5\' 3"  (1.6 m)   Wt 147 lb 6.4 oz (66.9 kg)   SpO2 95%   BMI 26.11 kg/m   GEN: A/Ox3; pleasant , NAD, elderly    HEENT:  Shell/AT,  EACs-clear, TMs-wnl, NOSE-clear, THROAT-clear, no lesions, no postnasal drip or exudate noted. Class 2 MP airway   NECK:  Supple w/ fair ROM; no JVD; normal carotid impulses w/o bruits;  no thyromegaly or nodules palpated; no lymphadenopathy.    RESP  Clear  P & A; w/o, wheezes/ rales/ or rhonchi. no accessory muscle use, no dullness to percussion  CARD:  RRR, no m/r/g, tr  peripheral edema, pulses intact, no cyanosis or clubbing.  GI:   Soft & nt; nml bowel sounds; no organomegaly or masses detected.   Musco: Warm bil, no deformities or joint swelling noted.   Neuro: alert, no focal deficits noted.    Skin: Warm, no lesions or rashes    Lab Results:    Imaging: Nm Pet Image Initial (pi) Skull Base To Thigh  Result Date: 03/17/2017 CLINICAL DATA:  Initial treatment strategy for left lung opacity. EXAM: NUCLEAR MEDICINE PET SKULL BASE TO THIGH TECHNIQUE: 7.6 MCi F-18 FDG was injected intravenously. Full-ring PET imaging was performed from the skull base to thigh after the radiotracer. CT data was obtained and used for attenuation correction and anatomic localization. FASTING BLOOD GLUCOSE:  Value: 103 mg/dl COMPARISON:  CT chest 01/15/2017. FINDINGS: NECK No hypermetabolic lymph nodes in the neck.  No acute CT findings. CHEST No abnormal hypermetabolism associated with an ovoid subpleural opacity in the left lower lobe. New small area of consolidation in the medial right lower lobe (series 8, image 43), minimally hypermetabolic. No hypermetabolic mediastinal, hilar or axillary  lymph nodes. No hypermetabolic pulmonary nodules. Atherosclerotic calcification of the arterial vasculature, including coronary arteries. Heart is enlarged. Small amount of pericardial fluid is considered physiologic. There is a chronic appearing small left pleural effusion with pleural thickening. Ovoid/rounded opacity in the left lower lobe with swirling of the surrounding parenchyma and volume loss in the left lower lobe. ABDOMEN/PELVIS No abnormal hypermetabolism in the liver, adrenal glands, spleen or pancreas. No hypermetabolic lymph nodes. Moderate ascites. Liver is grossly unremarkable. Small stones layer in the gallbladder. Adrenal glands are grossly unremarkable. Right kidney is absent. Left kidney is atrophic. Spleen, pancreas, stomach and bowel are grossly unremarkable. Atherosclerotic calcification of the arterial vasculature without abdominal aortic aneurysm. Small periumbilical left inguinal hernias contain fat. SKELETON No abnormal osseous hypermetabolism. IMPRESSION: 1. Benign rounded atelectasis in the left lower lobe with an adjacent small fibrothorax. No associated abnormal hypermetabolism. 2. Small area of consolidation in the medial right lower lobe, new, with associated mild hypermetabolism. Finding is considered infectious or inflammatory in etiology given the short time interval from 02/10/2017. 3. Aortic atherosclerosis (ICD10-170.0). Coronary artery calcification. 4. Moderate ascites. 5. Cholelithiasis. Electronically Signed   By: Lorin Picket M.D.   On: 03/17/2017 11:22     Assessment & Plan:   Atelectasis LLL consolidation /atx -perstent with effusion  Neg on PET scan - c/w benign etiology  She does have some new area on PET scan in [place] but is asx and new are `~ 1 month interval and only mild hypermetabolism  Will follow on serial CT chest  Repeat in 4 months .   Chronic cough Chronic cough - AR/GERD triggers ? RAD  GERD diet  Cont on current regimen .      Rexene Edison, NP 04/02/2017

## 2017-04-02 NOTE — Patient Instructions (Addendum)
Plan CT chest in 3-4 months .  Do not eat at bedtime /during night  Follow up in 3-4 months with Dr. Lamonte Sakai  (after CT chest )

## 2017-04-02 NOTE — Assessment & Plan Note (Signed)
Chronic cough - AR/GERD triggers ? RAD  GERD diet  Cont on current regimen .

## 2017-04-05 NOTE — Addendum Note (Signed)
Addended by: Parke Poisson E on: 04/05/2017 03:56 PM   Modules accepted: Orders

## 2017-04-16 NOTE — Addendum Note (Signed)
Addendum  created 04/16/17 1048 by Rica Koyanagi, MD   Sign clinical note

## 2017-05-14 ENCOUNTER — Telehealth: Payer: Self-pay | Admitting: Internal Medicine

## 2017-05-14 NOTE — Telephone Encounter (Signed)
Disability parking placard form dropped off for at front desk for completion.  Verified that patient section of form has been completed.  Last DOS/WCC with PCP was 01/11/17.  Placed form in blue team folder to be completed by clinical staff.  [name] [name]

## 2017-05-17 NOTE — Telephone Encounter (Signed)
Pt would like the form faxed to [facility], Fax [phone]. ep

## 2017-05-17 NOTE — Telephone Encounter (Signed)
Pt called back, would like form completed as soon as possible. Pt's pass expired on the 30th of June. ep

## 2017-05-17 NOTE — Telephone Encounter (Signed)
Clinical info completed on handicap placard form.  Place form in Dr. Gunadasa's box for completion.  Zaelyn Barbary,  Sibbie Flammia, CMA   

## 2017-05-17 NOTE — Telephone Encounter (Signed)
Form completed and placed in [name] [name]'s box 

## 2017-05-18 NOTE — Telephone Encounter (Signed)
Left voice message for patient to call regarding form. Pt requested that form be faxed and placed in outgoing to home address. If patient call back please ask to whom the form should be faxed to.  Derl Barrow, RN

## 2017-06-03 ENCOUNTER — Other Ambulatory Visit: Payer: Self-pay | Admitting: *Deleted

## 2017-06-03 DIAGNOSIS — E7800 Pure hypercholesterolemia, unspecified: Secondary | ICD-10-CM

## 2017-06-03 MED ORDER — ATORVASTATIN CALCIUM 40 MG PO TABS: 40.0000 mg | ORAL_TABLET | Freq: Every morning | ORAL | 2 refills | Status: DC

## 2017-06-23 ENCOUNTER — Other Ambulatory Visit: Payer: Self-pay | Admitting: Nurse Practitioner

## 2017-07-05 ENCOUNTER — Ambulatory Visit (INDEPENDENT_AMBULATORY_CARE_PROVIDER_SITE_OTHER)
Admission: RE | Admit: 2017-07-05 | Discharge: 2017-07-05 | Disposition: A | Discharge status: Home / Self Care | Payer: Medicare Other | Source: Ambulatory Visit | Attending: Emergency Medicine | Admitting: Emergency Medicine

## 2017-07-05 DIAGNOSIS — J9811 Atelectasis: Secondary | ICD-10-CM | POA: Diagnosis not present

## 2017-07-14 ENCOUNTER — Ambulatory Visit (INDEPENDENT_AMBULATORY_CARE_PROVIDER_SITE_OTHER): Payer: Medicare Other | Admitting: Emergency Medicine

## 2017-07-14 ENCOUNTER — Encounter: Payer: Self-pay | Admitting: Emergency Medicine

## 2017-07-14 DIAGNOSIS — J9811 Atelectasis: Secondary | ICD-10-CM

## 2017-07-14 DIAGNOSIS — R05 Cough: Secondary | ICD-10-CM | POA: Diagnosis not present

## 2017-07-14 DIAGNOSIS — R053 Chronic cough: Secondary | ICD-10-CM

## 2017-07-14 DIAGNOSIS — J90 Pleural effusion, not elsewhere classified: Secondary | ICD-10-CM | POA: Diagnosis not present

## 2017-07-14 NOTE — Assessment & Plan Note (Signed)
Stable left lower lobe rounded atelectasis by CT scan of the chest. This is a reassuring study. We will repeat her CT in 1 year, August 2019.

## 2017-07-14 NOTE — Patient Instructions (Signed)
Your CT scan of the chest shows stable left lower lobe scarring, no evidence of cancer. Continue your allergy medicine daily Keep albuterol available to use 2 puffs if needed for shortness of breath Follow with Dr Lamonte Sakai in 6 months or sooner if you have any problems

## 2017-07-14 NOTE — Assessment & Plan Note (Signed)
Continues to occur. Encouraged her to continue her allergy regimen. Denies any significant GERD symptoms.

## 2017-07-14 NOTE — Progress Notes (Signed)
Subjective:    Patient ID: [name] [name], female    DOB: July 13, 1951, 66 y.o.   MRN: [id number]  HPI 66 year old woman, never smoker, with a history of end-stage renal disease on HD (TThS), diabetes, coronary artery disease with associated ischemic cardiomyopathy, chronic left pleural effusion most recently tapped on 11/30/16 and consistent with a transudate based on low protein and LDH. She has been dealing with chronic cough and associated dyspnea for over 1.5 years. Occasionally she had post-tussive emesis. She feels that her throat is dry, has a globus sensation. She does have some nasal gtt and congestion, seems to come and go. Still has some occasional breakthrough GERD sx.   Treated with Protonix, loratadine, fluticasone nasal spray without much improvement.  PFT's have been ordered but not yet done. She was started on [facility] recently and believes the cough and the dyspnea are somewhat improved. The recent thoracentesis helped her breathing but did not change her cough. She occasionally feel that she gets choked with food / drink. Occasionally has trouble swallowing.   ROV 01/13/17 -- Patient with a history of end-stage renal disease, diabetes, coronary disease with an ischemic cardiomyopathy and an associated chronic left pleural effusion. I saw her in January for a chronic cough. We tried briefly increasing her Protonix to twice a day. Unfortunately  since that visit she was admitted to the hospital with anemia in the setting of GI blood loss. She was found to have diverticulosis as well as gastric antral vascular ectasia. She underwent cauterization, continued on Protonix once a day. She underwent pulmonary function testing today that I have personally reviewed. This was most consistent with restrictive lung disease on spirometry and lung volumes. Her diffusion capacity was decreased and corrected to the normal range when adjusted for alveolar volume.  Pleural fluid analysis of her most recent  thoracentesis was suggestive of a mild exudate. She has benefited from [place].       ROV 03/08/17 --   this follow-up visit for patient with a history of restrictive lung disease in the setting of a chronic left pleural effusion, cough, end-stage renal disease, diabetes, coronary artery disease with ischemic cardiomyopathy. Areas of chronic rounded atelectasis opacities on CT scan of the chest. She was treated for a possible recurrent LLL PNA in setting cough and mucous early April. She was temporarily better, but now has a dry cough. She is on flonase, has been using prn mucinex. She uses tessalon perles prn. Notes that her cough increases w HD and volume removal. We stopped Dulera, she has albuterol to use prn - she believe that it does help her some, uses it.   ROV 07/14/17 -- follow-up visit for patient with a history of restrictive lung disease with some left lower lobe rounded atelectasis and a chronic left pleural effusion. She also has end-stage renal disease on hemodialysis, coronary disease with an ischemic cardiomyopathy, diabetes. Most recent imaging with a PET scan from 03/17/17 that did not show hypermetabolism. We repeated a CT scan of the chest on 07/05/17 that I have personally reviewed. This showed a decrease in size in her left pleural effusion, no change in her left lower lobe atelectasis.  She continues to have cough. She is on loratadine, used to be on Dulera not currently   Review of Systems  Constitutional: Negative for fever and unexpected weight change.  HENT: Negative for congestion, dental problem, ear pain, nosebleeds, postnasal drip, rhinorrhea, sinus pressure, sneezing, sore throat and trouble swallowing.  Eyes: Negative for redness and itching.  Respiratory: Positive for cough and shortness of breath. Negative for chest tightness and wheezing.   Cardiovascular: Negative for palpitations and leg swelling.  Gastrointestinal: Negative for nausea and vomiting.    Genitourinary: Negative for dysuria.  Musculoskeletal: Negative for joint swelling.  Skin: Negative for rash.  Neurological: Negative for headaches.  Hematological: Does not bruise/bleed easily.  Psychiatric/Behavioral: Negative for dysphoric mood. The patient is not nervous/anxious.     Past Medical History:  Diagnosis Date  . Anemia   . Coronary artery disease    a. 6/16 Cath: Severe multivessel dzs->not surg candidate 2/2 debility/poor venous conduits->Med Rx; b. 01/2016 Cath/PCI: LM 50ost, LAD 77m (2.5x32 & 2.5x20 Promus Prem DES'), D1 70ost, D2 90, LCX 95p/m (3.0x16 Promus Prem DES), 65d, RCA 60p->d, RPDA 95 (2.5x12 Promus Prem DES).  . Diabetes mellitus ([place])   . ESRD (end stage renal disease) on dialysis ([place])       . Hearing difficulty    Mild   . Hyperlipidemia   . Hypertension   . Hyponatremia   . Ischemic cardiomyopathy    a. Echo 04/2015: EF 35%. Cath same time - 45%.  . Memory loss   . Syncope   . Thrombocytopenia ([place])      Family History  Problem Relation Age of Onset  . Bone cancer Father        Of jaws   . Throat cancer Father   . Heart attack Father   . Heart disease Unknown   . Diabetes Unknown   . Stroke Unknown   . Heart attack Sister   . Stroke Sister   . Stroke Brother      Social History   Social History  . Marital status: Widowed    Spouse name: N/A  . Number of children: N/A  . Years of education: N/A   Occupational History  . Not on file.   Social History Main Topics  . Smoking status: Never Smoker  . Smokeless tobacco: Never Used  . Alcohol use No  . Drug use: No  . Sexual activity: Not Currently    Birth control/ protection: Abstinence   Other Topics Concern  . Not on file   Social History Narrative      Denies alcohol, tobacco (never smoked), other drugs   Hobbies: reading           Allergies  Allergen Reactions  . Aleve [Naproxen Sodium] Other (See Comments)    Due to kidneys.  . Fentanyl Other (See Comments)     Facial swelling   . Motrin [Ibuprofen] Other (See Comments)    Due to kidneys.  . Nsaids Other (See Comments)    KIDNEY FAILURE AND ONLY HAS PARTIAL LEFT KIDNEY REMAINING (RIGHT ONE TAKEN OUT)  . Tape Other (See Comments)    Please use "paper" tape  . Contrast Media [Iodinated Diagnostic Agents] Itching     Outpatient Medications Prior to Visit  Medication Sig Dispense Refill  . acetaminophen (TYLENOL) 500 MG tablet Take 500 mg by mouth every 6 (six) hours as needed for headache.    . albuterol (PROVENTIL HFA;VENTOLIN HFA) 108 (90 Base) MCG/ACT inhaler Inhale 2 puffs into the lungs every 4 (four) hours as needed for wheezing or shortness of breath. 1 Inhaler 5  . aspirin 81 MG EC tablet Take 1 tablet (81 mg total) by mouth daily. 30 tablet 0  . atorvastatin (LIPITOR) 40 MG tablet Take 1 tablet (40 mg total) by  mouth every morning. 30 tablet 2  . benzonatate (TESSALON) 200 MG capsule Take 1 capsule (200 mg total) by mouth 3 (three) times daily as needed for cough. 90 capsule 1  . BRILINTA 90 MG TABS tablet TAKE ONE TABLET BY MOUTH TWICE DAILY 60 tablet 0  . calcium carbonate (OS-CAL - DOSED IN MG OF ELEMENTAL CALCIUM) 1250 (500 CA) MG tablet Take two (2) tablets by mouth three (3) times daily with meals and take two (2) tablets by mouth with snacks.    . DULERA 200-5 MCG/ACT AERO INHALE TWO PUFFS BY MOUTH TWICE DAILY AT  8  AM  AND  10  PM 1 Inhaler 1  . EQ ALLERGY RELIEF 10 MG tablet TAKE ONE TABLET BY MOUTH ONCE DAILY 30 tablet 10  . loratadine (CLARITIN) 10 MG tablet Take 1 tablet (10 mg total) by mouth daily. 30 tablet 5  . midodrine (PROAMATINE) 10 MG tablet Take one (1) tablet (10 mg total) by mouth at bedtime on Mondays, Wednesdays, and Fridays. Take one (1) tablet (10 mg total) by mouth pre-dialysis on Tuesdays, Thursdays, and Saturdays.  3  . multivitamin (RENA-VIT) TABS tablet Take 1 tablet by mouth at bedtime. 90 tablet 3  . nitroGLYCERIN (NITROSTAT) 0.4 MG SL tablet Place 1  tablet (0.4 mg total) under the tongue every 5 (five) minutes as needed for chest pain. 5 tablet 3  . pantoprazole (PROTONIX) 40 MG tablet Take 1 tablet (40 mg total) by mouth 2 (two) times daily. 60 tablet 5  . promethazine (PHENERGAN) 25 MG tablet Take 1 tablet (25 mg total) by mouth every 6 (six) hours as needed for nausea or vomiting. 30 tablet 0  . sevelamer carbonate (RENVELA) 2.4 g PACK Take two (2) packets by mouth with each meal. Twice daily on dialysis days and Three times daily on non-dialysis days.  0   No facility-administered medications prior to visit.         Objective:   Physical Exam Vitals:   07/14/17 1414 07/14/17 1415  BP:  116/74  Pulse:  (!) 56  SpO2:  95%  Weight: 147 lb (66.7 kg)   Height: 5' 3.5" (1.613 m)   Gen: Pleasant, kyphotic elderly woman, in no distress,  normal affect  ENT: No lesions,  mouth clear,  oropharynx clear, no postnasal drip  Neck: No JVD, no TMG, no carotid bruits  Lungs: No use of accessory muscles, clear without rales or rhonchi  Cardiovascular: RRR, heart sounds normal, no murmur or gallops, trace pretibial peripheral edema  Musculoskeletal: No deformities, no cyanosis or clubbing  Neuro: alert, non focal  Skin: Warm, no lesions or rashes     Assessment & Plan:  Atelectasis Stable left lower lobe rounded atelectasis by CT scan of the chest. This is a reassuring study. We will repeat her CT in 1 year, August 2019.  Pleural effusion Improved  Chronic cough Continues to occur. Encouraged her to continue her allergy regimen. Denies any significant GERD symptoms.  Baltazar Apo, MD, PhD 07/14/2017, 2:38 PM [facility] [phone] or if no answer [phone]

## 2017-07-14 NOTE — Assessment & Plan Note (Signed)
Improved

## 2017-07-24 ENCOUNTER — Other Ambulatory Visit: Payer: Self-pay | Admitting: Internal Medicine

## 2017-07-24 ENCOUNTER — Other Ambulatory Visit: Payer: Self-pay | Admitting: Interventional Cardiology

## 2017-07-24 DIAGNOSIS — I2510 Atherosclerotic heart disease of native coronary artery without angina pectoris: Secondary | ICD-10-CM

## 2017-07-24 DIAGNOSIS — E7800 Pure hypercholesterolemia, unspecified: Secondary | ICD-10-CM

## 2017-07-27 NOTE — Telephone Encounter (Signed)
Left message to call back.  Calling pt to clarify what she has been taking.  Plavix or Brilinta?

## 2017-07-27 NOTE — Telephone Encounter (Signed)
Looks like patient should be on brilinta but just wanted to clarify as last office visit has both medications listed. Please advise. Thanks, MI

## 2017-07-29 NOTE — Telephone Encounter (Signed)
Left message to have pt to call back.

## 2017-07-30 NOTE — Telephone Encounter (Signed)
Spoke with pt and clarified.  She is taking Brilinta 90 BID, not Plavix.  Please deny plavix refill.  Pt has plenty of Brilinta.  Scheduled pt see Dr. Tamala Julian in October as she is overdue.    Thanks!

## 2017-07-31 IMAGING — CT CT CHEST W/O CM
2 of 3 series · 14 of 36 positions shown, 17 images · non-contrast
Comparison: 12/22/2016 chest radiograph.

CLINICAL DATA: Recurrent left pleural effusion. History of
secondhand smoke exposure. 60 pound weight loss over the prior year.
History of left nephrectomy and end-stage renal disease on dialysis.

EXAM:
CT CHEST WITHOUT CONTRAST
TECHNIQUE: Multidetector CT imaging of the chest was performed following the
standard protocol without IV contrast.

[Series 2: thorax · axial · 0.75mm/px · z∈[-334,-64]mm · 11 of 159 slices shown, 14 images]
[im 12/159  mediastinal]
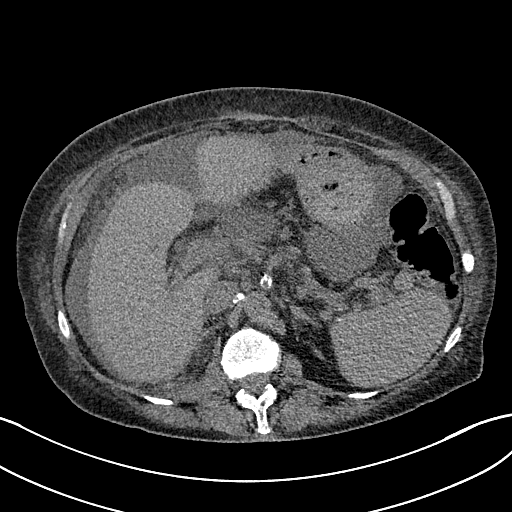
[im 12/159  lung]
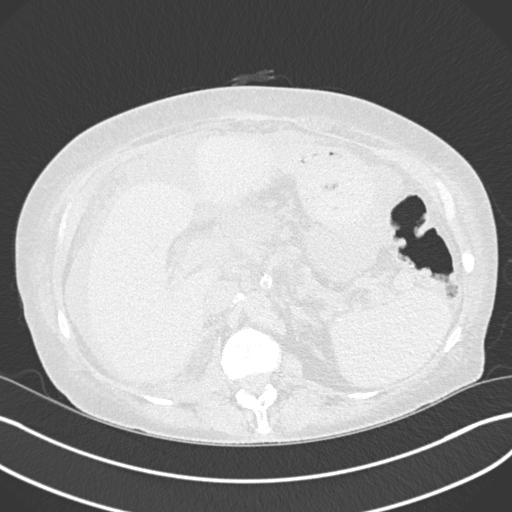
[im 24/159  lung]
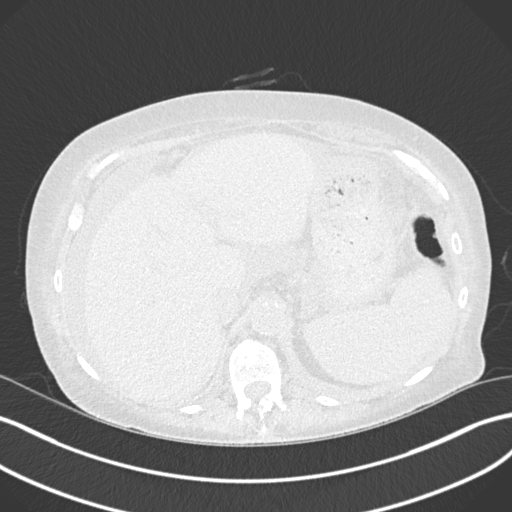
[im 36/159  lung]
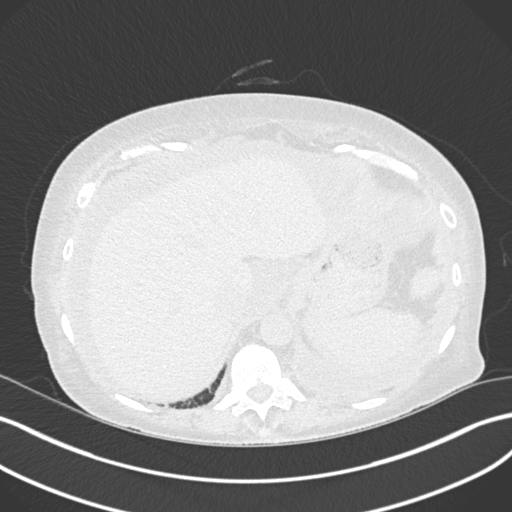
[im 53/159  lung]
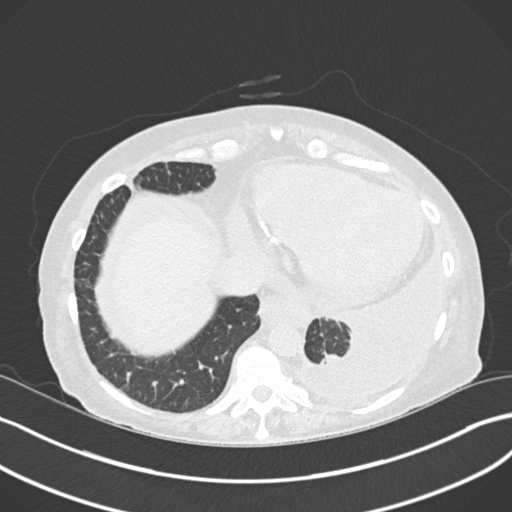
[im 65/159  mediastinal]
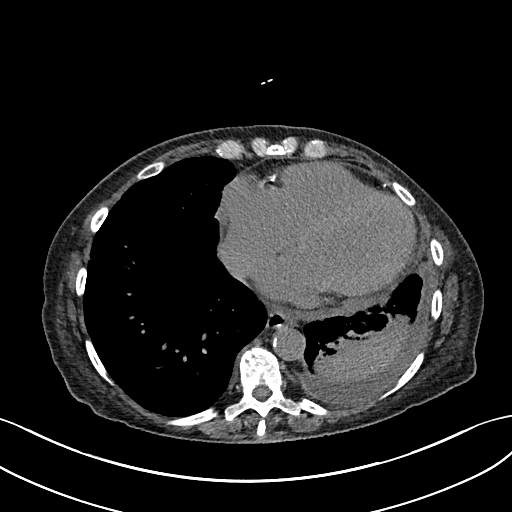
[im 65/159  lung]
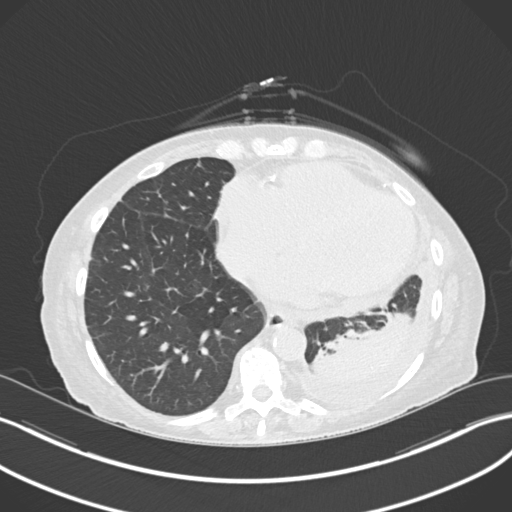
[im 82/159  lung]
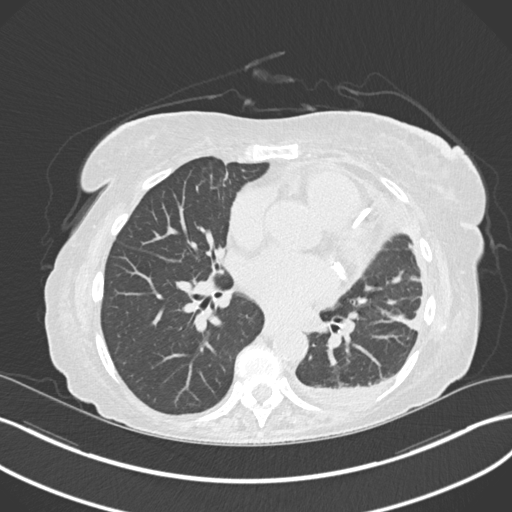
[im 94/159  lung]
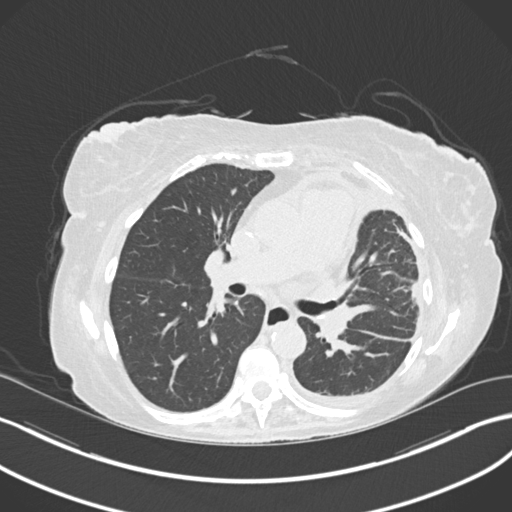
[im 106/159  lung]
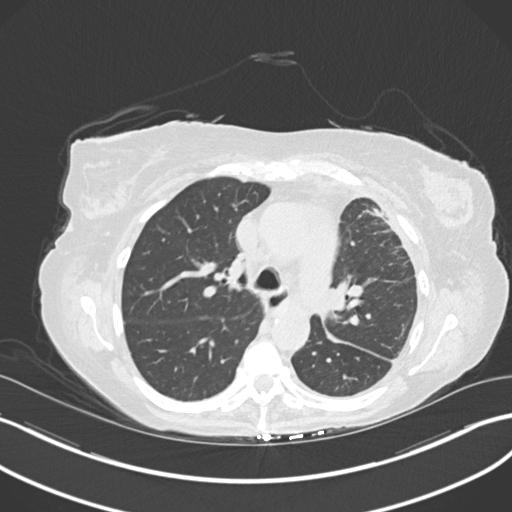
[im 123/159  mediastinal]
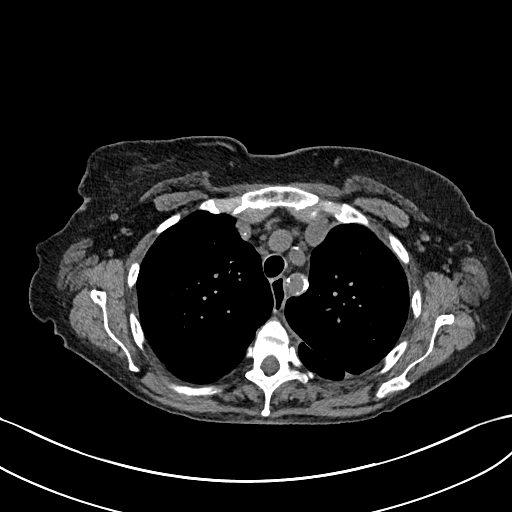
[im 123/159  lung]
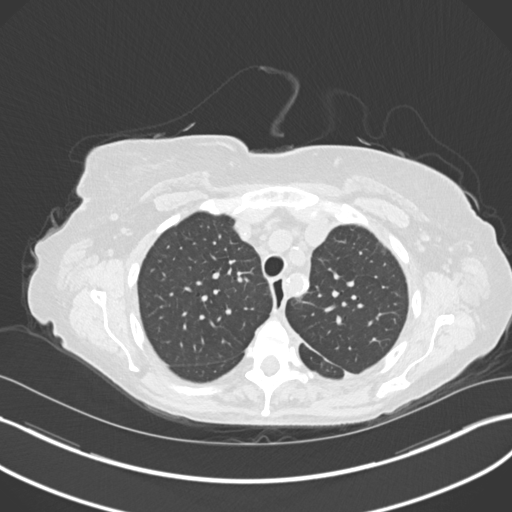
[im 135/159  lung]
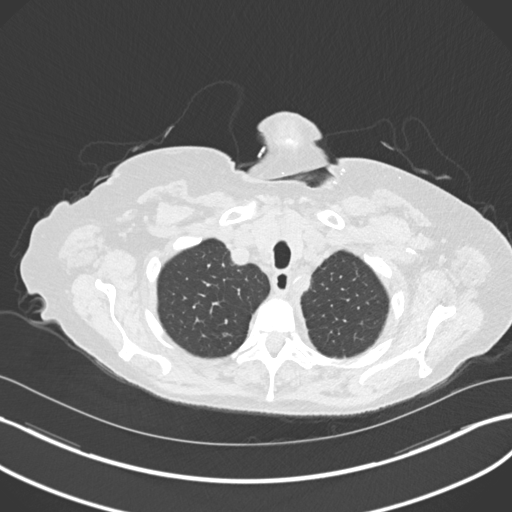
[im 147/159  lung]
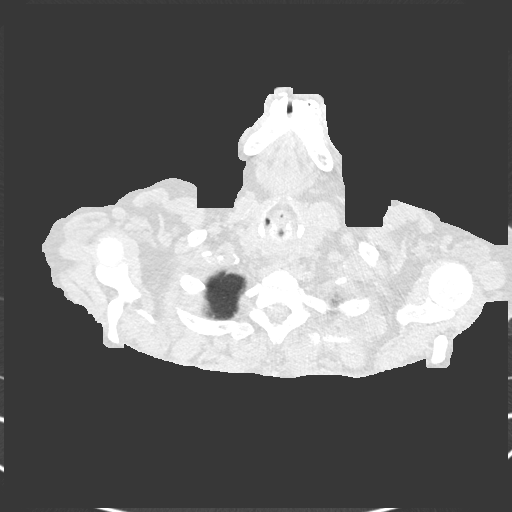

[Series 5: coronal · coronal · 0.62mm/px · 3 of 143 slices shown]
[im 29/143  lung]
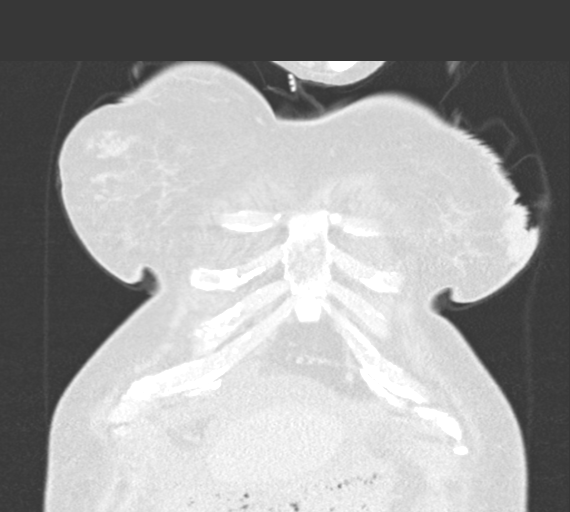
[im 57/143  lung]
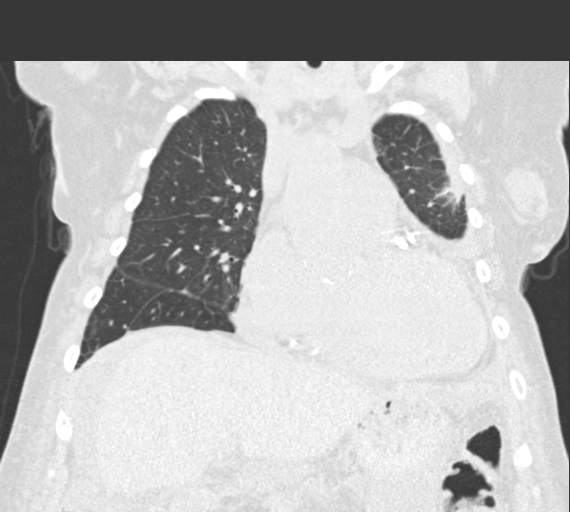
[im 86/143  lung]
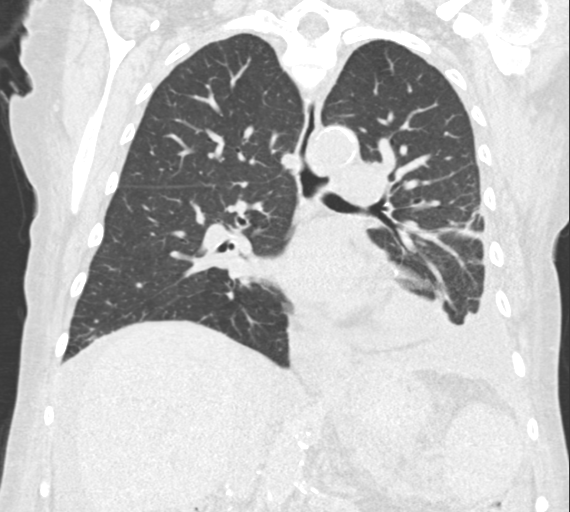

[14 of 36 positions shown; findings below may reference images not displayed]

FINDINGS: Cardiovascular: Mild cardiomegaly. Trace pericardial effusion/
thickening. Left main, left anterior descending, left circumflex and
right coronary atherosclerosis. Atherosclerotic nonaneurysmal
thoracic aorta. Dilated main pulmonary artery (3.6 cm diameter).

Mediastinum/Nodes: No discrete thyroid nodules. Unremarkable
esophagus. No pathologically enlarged axillary, mediastinal or gross
hilar lymph nodes, noting limited sensitivity for the detection of
hilar adenopathy on this noncontrast study.

Lungs/Pleura: No pneumothorax. No right pleural effusion. Small
basilar left pleural effusion with associated diffuse mild smooth
left pleural thickening. No appreciable calcified pleural plaques.
Solid 4 mm peripheral right upper lobe pulmonary nodule (series 7/
image 34). Solid 4 mm peripheral right lower lobe pulmonary nodule
(series 7/image 90). There is masslike consolidation in the
dependent subpleural left lower lobe measuring 6.8 x 3.3 cm (series
7/ image 92) located adjacent to the small left pleural effusion and
smooth left pleural thickening with associated volume loss, mild
parenchymal distortion and surrounding parenchymal banding, most
compatible with rounded atelectasis. Additional parenchymal banding
is seen in the peripheral lingula compatible with
pleural-parenchymal scarring. There is mild interlobular septal
thickening throughout both lungs.

Upper abdomen: Small volume ascites in the visualized upper abdomen.

Musculoskeletal: No aggressive appearing focal osseous lesions. Mild
thoracic spondylosis.
IMPRESSION: 1. Small basilar left pleural effusion with diffuse mild smooth left
pleural thickening.
2. Masslike consolidation in the dependent subpleural left lower
lobe adjacent to areas of left pleural disease with associated
volume loss and distortion, most compatible with rounded
atelectasis. Recommend attention to this finding on initial
follow-up chest CT in 3-6 months.
3. Small volume upper abdominal ascites.
4. Mild cardiomegaly.  Trace pericardial effusion/thickening.
5. Dilated main pulmonary artery, suggesting pulmonary arterial
hypertension.
6. Mild diffuse interlobular septal thickening suggesting mild
pulmonary edema.
7. Aortic atherosclerosis. Left main and 3 vessel coronary
atherosclerosis.
8. Two 4 mm solid right pulmonary nodules. No follow-up needed if
patient is low-risk (and has no known or suspected primary
neoplasm). Non-contrast chest CT can be considered in 12 months if
patient is high-risk. This recommendation follows the consensus
statement: Guidelines for Management of Incidental Pulmonary Nodules
Detected on CT Images: From the [HOSPITAL] 4980; Radiology

## 2017-08-16 ENCOUNTER — Other Ambulatory Visit: Payer: Self-pay | Admitting: *Deleted

## 2017-08-16 DIAGNOSIS — E7800 Pure hypercholesterolemia, unspecified: Secondary | ICD-10-CM

## 2017-08-16 MED ORDER — ATORVASTATIN CALCIUM 40 MG PO TABS: 40.0000 mg | ORAL_TABLET | Freq: Every morning | ORAL | 0 refills | Status: DC

## 2017-08-16 NOTE — Telephone Encounter (Signed)
Pharmacy requesting a 90 day supply

## 2017-09-15 ENCOUNTER — Ambulatory Visit: Payer: Medicare Other | Admitting: Interventional Cardiology

## 2017-10-19 ENCOUNTER — Encounter: Payer: Self-pay | Admitting: Internal Medicine

## 2017-10-19 DIAGNOSIS — M8580 Other specified disorders of bone density and structure, unspecified site: Secondary | ICD-10-CM | POA: Insufficient documentation

## 2017-10-26 NOTE — Progress Notes (Signed)
Cardiology Office Note    Date:  10/27/2017   ID:  [name] [name], DOB Apr 10, 1951, MRN [id number]  PCP:  Smiley Houseman, MD  Cardiologist: [name], MD   Chief Complaint  Patient presents with  . Coronary Artery Disease    History of Present Illness:  [name] [name] is a 66 y.o. female  who is doing well and here for follow-up of CAD, chronic combined systolic and diastolic heart failure, multiple coronary stents, CABG turndown, end-stage kidney disease, hypertension, and mild dementia.   She denies angina.  She is accompanied by her sister.  She does get shortness of breath on the day of dialysis.  She denies orthopnea PND.  Her worse days are Monday and Tuesday prior to dialysis since her last treatment prior is on Saturdays.  No nitroglycerin use.  No exercise/activity related angina.  Had an episode of syncope recently, but etiology is unclear.  Had dialysis the same day.  Likely volume related.   Past Medical History:  Diagnosis Date  . Anemia   . Coronary artery disease    a. 6/16 Cath: Severe multivessel dzs->not surg candidate 2/2 debility/poor venous conduits->Med Rx; b. 01/2016 Cath/PCI: LM 50ost, LAD 66m (2.5x32 & 2.5x20 Promus Prem DES'), D1 70ost, D2 90, LCX 95p/m (3.0x16 Promus Prem DES), 65d, RCA 60p->d, RPDA 95 (2.5x12 Promus Prem DES).  . Diabetes mellitus ([place])   . ESRD (end stage renal disease) on dialysis ([place])       . Hearing difficulty    Mild   . Hyperlipidemia   . Hypertension   . Hyponatremia   . Ischemic cardiomyopathy    a. Echo 04/2015: EF 35%. Cath same time - 45%.  . Memory loss   . Syncope   . Thrombocytopenia ([place])     Past Surgical History:  Procedure Laterality Date  . AV FISTULA PLACEMENT Left 03/07/2013   Procedure: ARTERIOVENOUS (AV) FISTULA CREATION;  Surgeon: Elam Dutch, MD;  Location: [place];  Service: Vascular;  Laterality: Left;  left brachiocephalic AVF  . CARDIAC CATHETERIZATION N/A 04/25/2015   Procedure: Left Heart Cath and Coronary Angiography;  Surgeon: Belva Crome, MD;  Location: [place] [facility];  Service: Cardiovascular;  Laterality: N/A;  . CARDIAC CATHETERIZATION N/A 02/03/2016   Procedure: Coronary Stent Intervention;  Surgeon: Belva Crome, MD;  Location: [place] [facility];  Service: Cardiovascular;  Laterality: N/A;  . COLONOSCOPY WITH PROPOFOL N/A 12/23/2016   Procedure: COLONOSCOPY WITH PROPOFOL;  Surgeon: Milus Banister, MD;  Location: [place];  Service: Endoscopy;  Laterality: N/A;  . CORONARY STENT PLACEMENT  02/03/2016   LAD X2     CIRCUMFLEX       RCA   . ESOPHAGOGASTRODUODENOSCOPY Left 02/20/2013   Procedure: ESOPHAGOGASTRODUODENOSCOPY (EGD);  Surgeon: Cleotis Nipper, MD;  [facility];  Service: Endoscopy;  Laterality: Left;  . ESOPHAGOGASTRODUODENOSCOPY (EGD) WITH PROPOFOL N/A 12/23/2016   Procedure: ESOPHAGOGASTRODUODENOSCOPY (EGD) WITH PROPOFOL;  Surgeon: Milus Banister, MD;  Location: [place];  Service: Endoscopy;  Laterality: N/A;  . 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 acetaminophen (TYLENOL) 500 MG tablet Take 500 mg by mouth every 6 (six) hours as needed for headache.    . albuterol (PROVENTIL HFA;VENTOLIN HFA) 108 (90 Base) MCG/ACT inhaler Inhale 2 puffs into the lungs every 4 (four) hours as needed for wheezing  or shortness of breath. 1 Inhaler 5  . aspirin 81 MG EC tablet Take 1 tablet (81 mg total) by mouth daily. 30 tablet 0  . atorvastatin (LIPITOR) 40 MG tablet Take 1 tablet (40 mg total) by mouth every morning. 90 tablet 0  . benzonatate (TESSALON) 200 MG capsule Take 1 capsule (200 mg total) by mouth 3 (three) times daily as needed for cough. 90 capsule 1  . calcium carbonate (OS-CAL - DOSED IN MG OF ELEMENTAL CALCIUM) 1250 (500 CA) MG tablet Take two (2) tablets by mouth three (3) times daily with meals  and take two (2) tablets by mouth with snacks.    . DULERA 200-5 MCG/ACT AERO INHALE TWO PUFFS BY MOUTH TWICE DAILY AT  8  AM  AND  10  PM 1 Inhaler 1  . EQ ALLERGY RELIEF 10 MG tablet TAKE ONE TABLET BY MOUTH ONCE DAILY 30 tablet 10  . loratadine (CLARITIN) 10 MG tablet Take 1 tablet (10 mg total) by mouth daily. 30 tablet 5  . midodrine (PROAMATINE) 10 MG tablet Take one (1) tablet (10 mg total) by mouth at bedtime on Mondays, Wednesdays, and Fridays. Take one (1) tablet (10 mg total) by mouth pre-dialysis on Tuesdays, Thursdays, and Saturdays.  3  . multivitamin (RENA-VIT) TABS tablet Take 1 tablet by mouth at bedtime. 90 tablet 3  . nitroGLYCERIN (NITROSTAT) 0.4 MG SL tablet Place 1 tablet (0.4 mg total) under the tongue every 5 (five) minutes as needed for chest pain. 5 tablet 3  . pantoprazole (PROTONIX) 40 MG tablet Take 1 tablet (40 mg total) by mouth 2 (two) times daily. 60 tablet 5  . promethazine (PHENERGAN) 25 MG tablet Take 1 tablet (25 mg total) by mouth every 6 (six) hours as needed for nausea or vomiting. 30 tablet 0  . sevelamer carbonate (RENVELA) 2.4 g PACK Take two (2) packets by mouth with each meal. Twice daily on dialysis days and Three times daily on non-dialysis days.  0  . BRILINTA 90 MG TABS tablet TAKE ONE TABLET BY MOUTH TWICE DAILY 60 tablet 0   No facility-administered medications prior to visit.      Allergies:   Aleve [naproxen sodium]; Contrast media [iodinated diagnostic agents]; Fentanyl; Motrin [ibuprofen]; Nsaids; and Tape   Social History   Socioeconomic History  . Marital status: Widowed    Spouse name: None  . Number of children: None  . Years of education: None  . Highest education level: None  Social Needs  . Financial resource strain: None  . Food insecurity - worry: None  . Food insecurity - inability: None  . Transportation needs - medical: None  . Transportation needs - non-medical: None  Occupational History  . None  Tobacco Use  .  Smoking status: Never Smoker  . Smokeless tobacco: Never Used  Substance and Sexual Activity  . Alcohol use: No  . Drug use: No  . Sexual activity: Not Currently    Birth control/protection: Abstinence  Other Topics Concern  . None  Social History Narrative      Denies alcohol, tobacco (never smoked), other drugs   Hobbies: reading        Family History:  The patient's family history includes Bone cancer in her father; Diabetes in her unknown relative; Heart attack in her father and sister; Heart disease in her unknown relative; Stroke in her brother, sister, and unknown relative; Throat cancer in her father.   ROS:   Please see the history  of present illness.    Constipation, hearing loss, had contrast reaction when her dialysis access was evaluated.  Occasional diarrhea.  Depression, and rash.  Decreased hearing in appetite. All other systems reviewed and are negative.   PHYSICAL EXAM:   VS:  BP 140/84   Pulse (!) 55   Ht 5\' 3"  (1.6 m)   Wt 161 lb 12.8 oz (73.4 kg)   BMI 28.66 kg/m    GEN: Well nourished, well developed, in no acute distress  HEENT: normal  Neck: no JVD, carotid bruits, or masses Cardiac: RRR; a soft early systolic murmur of mitral regurgitation.  No rubs, or gallops,no edema. Respiratory:  clear to auscultation bilaterally, normal work of breathing GI: soft, nontender, nondistended, + BS MS: no deformity or atrophy  Skin: warm and dry, no rash Neuro:  Alert and Oriented x 3, Strength and sensation are intact Psych: euthymic mood, full affect  Wt Readings from Last 3 Encounters:  10/27/17 161 lb 12.8 oz (73.4 kg)  07/14/17 147 lb (66.7 kg)  04/02/17 147 lb 6.4 oz (66.9 kg)      Studies/Labs Reviewed:   EKG:  EKG sinus bradycardia at 55 bpm nonspecific ST-T wave.  Recent Labs: 12/21/2016: ALT 12 12/23/2016: BUN 20; Creatinine, Ser 3.52; Potassium 3.6; Sodium 139 12/30/2016: Platelets 205 01/11/2017: Hemoglobin 8.2   Lipid Panel    Component  Value Date/Time   CHOL 93 (L) 09/23/2015 0915   TRIG 81 09/23/2015 0915   HDL 34 (L) 09/23/2015 0915   CHOLHDL 2.7 09/23/2015 0915   VLDL 16 09/23/2015 0915   LDLCALC 43 09/23/2015 0915    Additional studies/ records that were reviewed today include:  Cardiac cath and PCI March 2017: Coronary Diagrams   Diagnostic Diagram       Post-Intervention Diagram           ASSESSMENT:    1. Coronary artery disease involving native coronary artery of native heart without angina pectoris   2. Cardiomyopathy, ischemic   3. Diabetes mellitus type 2 in obese ([place])   4. ESRD on dialysis [facility])      PLAN:  In order of problems listed above:  1. Switch from Brilinta to Plavix 75 mg/day.  Plan to continue Plavix indefinitely because she has had restenosis in the LAD stent is a long segment of stent implanted in March 2017.  Clopidogrel will decrease the intensity of anti-platelet effect and hopefully decrease the possibility of bleeding. 2. EF is now improved to 50% when last evaluated in August 2017. 3. Not addressed 4. End-stage kidney disease followed by [facility] and on chronic Tuesday, Thursday, and Saturday hemodialysis.  Discontinue Brilinta and start Plavix 75 mg/day.  Clinical follow-up 6-9 months.  Call if angina or cardiac concerns.    Medication Adjustments/Labs and Tests Ordered: Current medicines are reviewed at length with the patient today.  Concerns regarding medicines are outlined above.  Medication changes, Labs and Tests ordered today are listed in the Patient Instructions below. Patient Instructions  Medication Instructions:  1) DISCONTINUE Brilinta 2) START Plavix 75mg  once daily  Labwork: None  Testing/Procedures: None  Follow-Up: Your physician wants you to follow-up in: 6 months with Dr. Tamala Julian.  You will receive a reminder letter in the mail two months in advance. If you don't receive a letter, please call our office to schedule the  follow-up appointment.   Any Other Special Instructions Will Be Listed Below (If Applicable).     If you need a  refill on your cardiac medications before your next appointment, please call your pharmacy.      Signed, [name], MD  10/27/2017 2:27 PM    [facility] [place], [place], [place]  [address] Phone: [phone]; Fax: [phone]

## 2017-10-27 ENCOUNTER — Encounter: Payer: Self-pay | Admitting: Interventional Cardiology

## 2017-10-27 ENCOUNTER — Ambulatory Visit: Payer: Medicare Other | Admitting: Interventional Cardiology

## 2017-10-27 VITALS — BP 140/84 | HR 55 | Ht 63.0 in | Wt 161.8 lb

## 2017-10-27 DIAGNOSIS — I255 Ischemic cardiomyopathy: Secondary | ICD-10-CM

## 2017-10-27 DIAGNOSIS — I2510 Atherosclerotic heart disease of native coronary artery without angina pectoris: Secondary | ICD-10-CM | POA: Diagnosis not present

## 2017-10-27 DIAGNOSIS — Z992 Dependence on renal dialysis: Secondary | ICD-10-CM

## 2017-10-27 DIAGNOSIS — N186 End stage renal disease: Secondary | ICD-10-CM | POA: Diagnosis not present

## 2017-10-27 DIAGNOSIS — E669 Obesity, unspecified: Secondary | ICD-10-CM | POA: Diagnosis not present

## 2017-10-27 DIAGNOSIS — E1169 Type 2 diabetes mellitus with other specified complication: Secondary | ICD-10-CM | POA: Diagnosis not present

## 2017-10-27 MED ORDER — CLOPIDOGREL BISULFATE 75 MG PO TABS: 75.0000 mg | ORAL_TABLET | Freq: Every day | ORAL | 3 refills | Status: DC

## 2017-10-27 NOTE — Patient Instructions (Signed)
Medication Instructions:  1) DISCONTINUE Brilinta 2) START Plavix 75mg  once daily  Labwork: None  Testing/Procedures: None  Follow-Up: Your physician wants you to follow-up in: 6 months with Dr. Tamala Julian.  You will receive a reminder letter in the mail two months in advance. If you don't receive a letter, please call our office to schedule the follow-up appointment.   Any Other Special Instructions Will Be Listed Below (If Applicable).     If you need a refill on your cardiac medications before your next appointment, please call your pharmacy.

## 2017-11-25 ENCOUNTER — Other Ambulatory Visit: Payer: Self-pay | Admitting: Interventional Cardiology

## 2017-11-25 DIAGNOSIS — I2510 Atherosclerotic heart disease of native coronary artery without angina pectoris: Secondary | ICD-10-CM

## 2017-12-01 ENCOUNTER — Other Ambulatory Visit: Payer: Self-pay

## 2017-12-01 MED ORDER — CLOPIDOGREL BISULFATE 75 MG PO TABS: 75.0000 mg | ORAL_TABLET | Freq: Every day | ORAL | 0 refills | Status: DC

## 2017-12-01 MED ORDER — NITROGLYCERIN 0.4 MG SL SUBL: 0.4000 mg | SUBLINGUAL_TABLET | SUBLINGUAL | 0 refills | Status: DC | PRN

## 2017-12-06 ENCOUNTER — Encounter: Payer: Self-pay | Admitting: Internal Medicine

## 2017-12-26 ENCOUNTER — Other Ambulatory Visit: Payer: Self-pay | Admitting: Emergency Medicine

## 2018-01-10 ENCOUNTER — Ambulatory Visit: Payer: Medicare Other | Admitting: Emergency Medicine

## 2018-01-10 ENCOUNTER — Encounter: Payer: Self-pay | Admitting: Emergency Medicine

## 2018-01-10 DIAGNOSIS — R053 Chronic cough: Secondary | ICD-10-CM

## 2018-01-10 DIAGNOSIS — R05 Cough: Secondary | ICD-10-CM

## 2018-01-10 DIAGNOSIS — J9811 Atelectasis: Secondary | ICD-10-CM | POA: Diagnosis not present

## 2018-01-10 DIAGNOSIS — R911 Solitary pulmonary nodule: Secondary | ICD-10-CM

## 2018-01-10 MED ORDER — PANTOPRAZOLE SODIUM 40 MG PO TBEC: 40.0000 mg | DELAYED_RELEASE_TABLET | Freq: Two times a day (BID) | ORAL | 5 refills | Status: DC

## 2018-01-10 MED ORDER — LORATADINE 10 MG PO TABS: 10.0000 mg | ORAL_TABLET | Freq: Every day | ORAL | 5 refills | Status: DC

## 2018-01-10 NOTE — Assessment & Plan Note (Addendum)
Apparent left lower lobe rounded atelectasis by CT scan of the chest.  PET scan 03/17/17 was negative.  It has shrunk on serial CTs.  We will repeat one more in August 2019.  She has associated restrictive lung disease

## 2018-01-10 NOTE — Patient Instructions (Addendum)
We will plan to repeat your CT scan of the chest in August 2019 to compare with your priors. Please continue pantoprazole 40 mg twice a day. You should restart your loratadine, fluticasone nasal spray, Benadryl this spring when allergy season starts We will not start any new inhaled medication at this time. Follow in August after your CT scan to review the results.

## 2018-01-10 NOTE — Progress Notes (Signed)
Subjective:    Patient ID: [name] [name], female    DOB: Jul 15, 1951, 67 y.o.   MRN: [id number]  HPI  ROV 07/14/17 -- follow-up visit for patient with a history of restrictive lung disease with some left lower lobe rounded atelectasis and a chronic left pleural effusion. She also has end-stage renal disease on hemodialysis, coronary disease with an ischemic cardiomyopathy, diabetes. Most recent imaging with a PET scan from 03/17/17 that did not show hypermetabolism. We repeated a CT scan of the chest on 07/05/17 that I have personally reviewed. This showed a decrease in size in her left pleural effusion, no change in her left lower lobe atelectasis.  She continues to have cough. She is on loratadine, used to be on Dulera not currently  ROV 01/10/18 --68 year old woman with end-stage renal disease, coronary disease with a cardiomyopathy, diabetes.  She also has a history of restrictive lung disease and some left lower lobe rounded atelectasis with an associated chronic pleural effusion.  I have followed her for her radiographic abnormality and also for cough. Her cough is inactive right now.  She is not currently requiring any loratadine, flonase, benadryl - planning to start this spring if needed. She is on pantoprazole bid. She feels that she may be getting a URI. She can have exertional SOB. No wheeze. She has problems with hypotension during HD.    Review of Systems  Constitutional: Negative for fever and unexpected weight change.  HENT: Negative for congestion, dental problem, ear pain, nosebleeds, postnasal drip, rhinorrhea, sinus pressure, sneezing, sore throat and trouble swallowing.   Eyes: Negative for redness and itching.  Respiratory: Positive for cough and shortness of breath. Negative for chest tightness and wheezing.   Cardiovascular: Negative for palpitations and leg swelling.  Gastrointestinal: Negative for nausea and vomiting.  Genitourinary: Negative for dysuria.  Musculoskeletal:  Negative for joint swelling.  Skin: Negative for rash.  Neurological: Negative for headaches.  Hematological: Does not bruise/bleed easily.  Psychiatric/Behavioral: Negative for dysphoric mood. The patient is not nervous/anxious.     Past Medical History:  Diagnosis Date  . Anemia   . Coronary artery disease    a. 6/16 Cath: Severe multivessel dzs->not surg candidate 2/2 debility/poor venous conduits->Med Rx; b. 01/2016 Cath/PCI: LM 50ost, LAD 72m (2.5x32 & 2.5x20 Promus Prem DES'), D1 70ost, D2 90, LCX 95p/m (3.0x16 Promus Prem DES), 65d, RCA 60p->d, RPDA 95 (2.5x12 Promus Prem DES).  . Diabetes mellitus ([place])   . ESRD (end stage renal disease) on dialysis ([place])       . Hearing difficulty    Mild   . Hyperlipidemia   . Hypertension   . Hyponatremia   . Ischemic cardiomyopathy    a. Echo 04/2015: EF 35%. Cath same time - 45%.  . Memory loss   . Syncope   . Thrombocytopenia ([place])      Family History  Problem Relation Age of Onset  . Bone cancer Father        Of jaws   . Throat cancer Father   . Heart attack Father   . Heart disease Unknown   . Diabetes Unknown   . Stroke Unknown   . Heart attack Sister   . Stroke Sister   . Stroke Brother      Social History   Socioeconomic History  . Marital status: Widowed    Spouse name: Not on file  . Number of children: Not on file  . Years of education: Not on file  .  Highest education level: Not on file  Social Needs  . Financial resource strain: Not on file  . Food insecurity - worry: Not on file  . Food insecurity - inability: Not on file  . Transportation needs - medical: Not on file  . Transportation needs - non-medical: Not on file  Occupational History  . Not on file  Tobacco Use  . Smoking status: Never Smoker  . Smokeless tobacco: Never Used  Substance and Sexual Activity  . Alcohol use: No  . Drug use: No  . Sexual activity: Not Currently    Birth control/protection: Abstinence  Other Topics Concern  .  Not on file  Social History Narrative      Denies alcohol, tobacco (never smoked), other drugs   Hobbies: reading        Allergies  Allergen Reactions  . Aleve [Naproxen Sodium] Other (See Comments)    Due to kidneys.  . Contrast Media [Iodinated Diagnostic Agents] Itching, Swelling and Other (See Comments)    REACTION: Redness Per patient 10/27/17 "Cannot use any type of dye."  . Fentanyl Other (See Comments)    Facial swelling   . Motrin [Ibuprofen] Other (See Comments)    Due to kidneys.  . Nsaids Other (See Comments)    KIDNEY FAILURE AND ONLY HAS PARTIAL LEFT KIDNEY REMAINING (RIGHT ONE TAKEN OUT)  . Tape Other (See Comments)    Please use "paper" tape     Outpatient Medications Prior to Visit  Medication Sig Dispense Refill  . acetaminophen (TYLENOL) 500 MG tablet Take 500 mg by mouth every 6 (six) hours as needed for headache.    Marland Kitchen aspirin 81 MG EC tablet Take 1 tablet (81 mg total) by mouth daily. 30 tablet 0  . atorvastatin (LIPITOR) 40 MG tablet Take 1 tablet (40 mg total) by mouth every morning. 90 tablet 0  . clopidogrel (PLAVIX) 75 MG tablet Take 1 tablet (75 mg total) by mouth daily. 90 tablet 0  . midodrine (PROAMATINE) 10 MG tablet Take one (1) tablet (10 mg total) by mouth at bedtime on Mondays, Wednesdays, and Fridays. Take one (1) tablet (10 mg total) by mouth pre-dialysis on Tuesdays, Thursdays, and Saturdays.  3  . multivitamin (RENA-VIT) TABS tablet Take 1 tablet by mouth at bedtime. 90 tablet 3  . nitroGLYCERIN (NITROSTAT) 0.4 MG SL tablet Place 1 tablet (0.4 mg total) under the tongue every 5 (five) minutes as needed for chest pain. 25 tablet 0  . sevelamer carbonate (RENVELA) 2.4 g PACK Take two (2) packets by mouth with each meal. Twice daily on dialysis days and Three times daily on non-dialysis days.  0  . loratadine (CLARITIN) 10 MG tablet Take 1 tablet (10 mg total) by mouth daily. 30 tablet 5  . pantoprazole (PROTONIX) 40 MG tablet TAKE ONE TABLET  BY MOUTH TWICE DAILY 60 tablet 5  . albuterol (PROVENTIL HFA;VENTOLIN HFA) 108 (90 Base) MCG/ACT inhaler Inhale 2 puffs into the lungs every 4 (four) hours as needed for wheezing or shortness of breath. 1 Inhaler 5  . benzonatate (TESSALON) 200 MG capsule Take 1 capsule (200 mg total) by mouth 3 (three) times daily as needed for cough. 90 capsule 1  . calcium carbonate (OS-CAL - DOSED IN MG OF ELEMENTAL CALCIUM) 1250 (500 CA) MG tablet Take two (2) tablets by mouth three (3) times daily with meals and take two (2) tablets by mouth with snacks.    . DULERA 200-5 MCG/ACT AERO INHALE TWO PUFFS BY  MOUTH TWICE DAILY AT  8  AM  AND  10  PM 1 Inhaler 1  . EQ ALLERGY RELIEF 10 MG tablet TAKE ONE TABLET BY MOUTH ONCE DAILY 30 tablet 10  . promethazine (PHENERGAN) 25 MG tablet Take 1 tablet (25 mg total) by mouth every 6 (six) hours as needed for nausea or vomiting. 30 tablet 0   No facility-administered medications prior to visit.         Objective:   Physical Exam There were no vitals filed for this visit.  Gen: Pleasant, kyphotic elderly woman, in no distress,  normal affect  ENT: No lesions,  mouth clear,  oropharynx clear, no postnasal drip  Neck: No JVD, no TMG, no carotid bruits  Lungs: No use of accessory muscles, clear without rales or rhonchi  Cardiovascular: RRR, heart sounds normal, no murmur or gallops, trace pretibial peripheral edema  Musculoskeletal: No deformities, no cyanosis or clubbing  Neuro: alert, non focal  Skin: Warm, no lesions or rashes     Assessment & Plan:  Atelectasis Apparent left lower lobe rounded atelectasis by CT scan of the chest.  PET scan 03/17/17 was negative.  It has shrunk on serial CTs.  We will repeat one more in August 2019.  She has associated restrictive lung disease  Chronic cough Stable at this time.  Contributions of GERD and seasonal allergic rhinitis.  Her GERD is well controlled on pantoprazole.  She also believes that it has been better  since she has been on sevelamer.  She will restart her loratadine, fluticasone, Benadryl this spring for allergy season.  Baltazar Apo, MD, PhD 01/10/2018, 2:20 PM [facility] [phone] or if no answer [phone]

## 2018-01-10 NOTE — Assessment & Plan Note (Signed)
Stable at this time.  Contributions of GERD and seasonal allergic rhinitis.  Her GERD is well controlled on pantoprazole.  She also believes that it has been better since she has been on sevelamer.  She will restart her loratadine, fluticasone, Benadryl this spring for allergy season.

## 2018-02-03 ENCOUNTER — Ambulatory Visit: Payer: Medicare Other

## 2018-02-06 NOTE — Progress Notes (Signed)
   [facility] Phone: [phone]   Date of Visit: 02/07/2018   HPI:  Back Pain:  - reports of have back spasms for years. Worsened in the past month or so. No injury  - it is throbbing in nature and does not radiate.  - no numbness or tingling of the lower extremities. No muscle weakness - walks with a walker due to balance issues (for the past 4-5 years) - no issues with urinary retention or incontinence.  - reports that she has a chronic history of bowel incontinence. She is unsure if this was evaluated.  - has not been exercising as much because of the back pain - has tried icey hot without much improvement   Constipation with Abdominal Pain:  - reports of constipation for about 2 weeks - she took stool softer and miralax over the weekend and she is starting to move her bowels now - no blood in stool or melena - her abdominal pain is getting better  - she also reports of not tolerating solid food for the past week. She did eat solid food last night and this morning and has been doing well.  - does have a history of diverticulosis of the left colon.   Abdominal Cramping   ROS: See HPI.  [place]:  PMH: ESRD CAD Diverticulosis DM2 with retinopathy   PHYSICAL EXAM: BP 132/80   Pulse 60   Temp 98 F (36.7 C) (Oral)   Wt 156 lb (70.8 kg)   SpO2 97%   BMI 27.63 kg/m  GEN: NAD, nontoxic CV: RRR, systolic murmur PULM: CTAB, normal effort ABD: Soft,  Nondistended, mild tenderness in the upper abdomen without guarding or rebound, positive bowel sounds.  NABS, no organomegaly MSK: midline tenderness in the thoracic and lumbar regions minimal paraspinal tenderness. Lower extremity strength is normal bilaterally. Normal sensation to light touch. Straight leg raise is negative bilaterally.  SKIN: No rash or cyanosis; warm and well-perfused EXTR: No lower extremity edema or calf tenderness PSYCH: Mood and affect euthymic, normal rate and volume of  speech NEURO: Awake, alert, no focal deficits grossly, normal speech   ASSESSMENT/PLAN: Back Pain:  No history of injury. However due to midline tenderness, will obtain Thoracic and Lumbar xrays. Further management pending these results.   Constipation Her abdominal symptoms are most likely due to constipation. Reassuring that symptoms are improving when she started moving her bowels again. Recommend continuing bowel regimen for about 1 week to see if symptoms continue to improve. Abdominal symptoms are unlikely due to diverticulitis. Will obtain CMP.   Follow up in 1-2 weeks.   Smiley Houseman, MD PGY [place]

## 2018-02-07 ENCOUNTER — Ambulatory Visit: Payer: Medicare Other | Admitting: Internal Medicine

## 2018-02-07 ENCOUNTER — Encounter: Payer: Self-pay | Admitting: Internal Medicine

## 2018-02-07 ENCOUNTER — Other Ambulatory Visit: Payer: Self-pay

## 2018-02-07 VITALS — BP 132/80 | HR 60 | Temp 98.0°F | Wt 156.0 lb

## 2018-02-07 DIAGNOSIS — R109 Unspecified abdominal pain: Secondary | ICD-10-CM | POA: Diagnosis not present

## 2018-02-07 DIAGNOSIS — K5900 Constipation, unspecified: Secondary | ICD-10-CM | POA: Diagnosis not present

## 2018-02-07 DIAGNOSIS — M5489 Other dorsalgia: Secondary | ICD-10-CM | POA: Diagnosis not present

## 2018-02-07 NOTE — Patient Instructions (Signed)
We will get xrays of your back  We will get blood work today   Continue miralax for 1 week.   Follow up in about 1-2 weeks

## 2018-02-08 ENCOUNTER — Encounter: Payer: Self-pay | Admitting: Internal Medicine

## 2018-02-08 LAB — LIPASE: LIPASE: 10 U/L — AB (ref 14–72)

## 2018-02-08 LAB — CMP14+EGFR
ALK PHOS: 181 IU/L — AB (ref 39–117)
ALT: 15 IU/L (ref 0–32)
AST: 26 IU/L (ref 0–40)
Albumin/Globulin Ratio: 1.8 (ref 1.2–2.2)
Albumin: 3.9 g/dL (ref 3.6–4.8)
BILIRUBIN TOTAL: 0.5 mg/dL (ref 0.0–1.2)
BUN / CREAT RATIO: 5 — AB (ref 12–28)
BUN: 24 mg/dL (ref 8–27)
CHLORIDE: 97 mmol/L (ref 96–106)
CO2: 25 mmol/L (ref 20–29)
CREATININE: 4.86 mg/dL — AB (ref 0.57–1.00)
Calcium: 9 mg/dL (ref 8.7–10.3)
GFR calc Af Amer: 10 mL/min/{1.73_m2} — ABNORMAL LOW (ref >59)
GFR calc non Af Amer: 9 mL/min/{1.73_m2} — ABNORMAL LOW (ref >59)
GLOBULIN, TOTAL: 2.2 g/dL (ref 1.5–4.5)
GLUCOSE: 153 mg/dL — AB (ref 65–99)
Potassium: 3.3 mmol/L — ABNORMAL LOW (ref 3.5–5.2)
SODIUM: 140 mmol/L (ref 134–144)
Total Protein: 6.1 g/dL (ref 6.0–8.5)

## 2018-02-08 NOTE — Assessment & Plan Note (Signed)
Her abdominal symptoms are most likely due to constipation. Reassuring that symptoms are improving when she started moving her bowels again. Recommend continuing bowel regimen for about 1 week to see if symptoms continue to improve. Abdominal symptoms are unlikely due to diverticulitis. Will obtain CMP.

## 2018-02-09 ENCOUNTER — Other Ambulatory Visit: Payer: Self-pay | Admitting: Internal Medicine

## 2018-02-09 ENCOUNTER — Ambulatory Visit
Admission: RE | Admit: 2018-02-09 | Discharge: 2018-02-09 | Disposition: A | Discharge status: Home / Self Care | Payer: Medicare Other | Source: Ambulatory Visit | Attending: Family Medicine | Admitting: Family Medicine

## 2018-02-09 DIAGNOSIS — M5489 Other dorsalgia: Secondary | ICD-10-CM

## 2018-02-10 ENCOUNTER — Telehealth: Payer: Self-pay | Admitting: Internal Medicine

## 2018-02-10 DIAGNOSIS — G8929 Other chronic pain: Secondary | ICD-10-CM

## 2018-02-10 DIAGNOSIS — M545 Low back pain, unspecified: Secondary | ICD-10-CM

## 2018-02-10 DIAGNOSIS — M5136 Other intervertebral disc degeneration, lumbar region: Secondary | ICD-10-CM

## 2018-02-10 NOTE — Telephone Encounter (Signed)
Attempted to call patient regarding labwork/imaging but went to voicemail. Left message to call back.

## 2018-02-14 NOTE — Telephone Encounter (Signed)
Patient calling back requesting imaging results. Call back is [phone]. Danley Danker, RN [facility] RN)

## 2018-02-15 NOTE — Addendum Note (Signed)
Addended by: Smiley Houseman on: 02/15/2018 05:14 PM   Modules accepted: Orders

## 2018-02-15 NOTE — Telephone Encounter (Signed)
Attempted to call back. Went to [employer]. Left message

## 2018-02-15 NOTE — Telephone Encounter (Signed)
Attempted to call back. Not at home, but will be available tomorrow morning per her friend.

## 2018-02-16 MED ORDER — DICLOFENAC SODIUM 1 % TD GEL: 2.0000 g | Freq: Two times a day (BID) | TRANSDERMAL | 2 refills | Status: DC | PRN

## 2018-02-16 NOTE — Telephone Encounter (Signed)
Called patient to discuss x-rays. Showed some arthritis in the lumbar spine.   Decided to try voltaren gel and physical therapy.

## 2018-02-16 NOTE — Addendum Note (Signed)
Addended by: Smiley Houseman on: 02/16/2018 08:51 AM   Modules accepted: Orders

## 2018-02-17 ENCOUNTER — Telehealth: Payer: Self-pay

## 2018-02-17 NOTE — Telephone Encounter (Signed)
Prior approval for diclofenac gel completed via Cover[employer].   Status pending. Will recheck status in 24 hours. Wallace Cullens, RN

## 2018-02-17 NOTE — Telephone Encounter (Signed)
Received voicemail from [facility] that diclofenac prior auth was denied due to non FDA approved use. Can call for appeal, do you want to prescribe something else or attempt appeal? Appeal number [phone] Wallace Cullens, RN

## 2018-02-18 MED ORDER — LIDOCAINE 5 % EX PTCH: 1.0000 | MEDICATED_PATCH | CUTANEOUS | 0 refills | Status: DC

## 2018-02-18 NOTE — Telephone Encounter (Signed)
Sorry quick question, voltaren jell is not FDA approved for musculoskeletal pain?

## 2018-02-18 NOTE — Telephone Encounter (Signed)
Patient informed.  Bren Borys,CMA  

## 2018-02-18 NOTE — Telephone Encounter (Signed)
I sent in prescription for Lidocaine patch since insurance will not cover voltaren gel.   Blue team, please inform patient of this.   Forwarded to Ms. Fabio Asa as an [place]

## 2018-02-18 NOTE — Telephone Encounter (Signed)
Diagnosis used was low back pain per office visit from 02/07/18.

## 2018-02-20 NOTE — Progress Notes (Signed)
   [facility] Phone: [phone]   Date of Visit: 02/21/2018   HPI:  Back Pain:   - history of chronic back pain that has significantly worsened in the past month.  - usually controlled with icy hot and PRN Tylenol but this is not helping anymore - she has had to shorten her dialysis session recently due to her back pain - reports it does not radiate  - she reports of chronic intermittent bowel incontinence when she has diarrhea.  - was unable to purchase lidocaine patch due to cost. Insurance declined to cover voltaren gel.   Abdominal Pain:  - this is a chronic symptom with alternating watery diarrhea and constipation. When she has diarrhea she does have intermittent fecal incontinence.  - she reports that she was seen by GI for this "years ago but they could not find a reason for her symptoms".  - she reports of decreased appetite. Intermittent emesis.  - she describes her abdominal pain as similar to "period cramps"  - per chart review, her symptoms were thought to be possibly due to gastroparesis   - reports that she has been feeling depressed due to her worsening back pain and abdominal pain. She denies SI/HI.  Of note, she brought lab work from dialysis center. Her A1c was 6.4 in Jan 2019 and 6.8 in Oct 2018. Her hemoglobin was 12.5 in March 2019  ROS: See HPI.  [place]:  PMH: ESRD CAN Diverticulosis  DM2 with retinopathy   PHYSICAL EXAM: BP 110/64   Pulse (!) 54   Temp 97.8 F (36.6 C) (Oral)   Ht 5\' 3"  (1.6 m)   Wt 154 lb (69.9 kg)   SpO2 98%   BMI 27.28 kg/m  GEN: NAD CV: RRR, systolic murmur PULM: CTAB, normal effort ABD: Soft, nontender, nondistended, NABS, no organomegaly SKIN: No rash or cyanosis; warm and well-perfused FGH:WEXHBZJ tenderness to palpation of the lumbar spine. No paraspinal tenderness. Lower extremity strength is normal bilaterally. Normal sensation to light touch.  EXTR: No lower extremity edema or calf  tenderness PSYCH: Mood and affect euthymic, normal rate and volume of speech NEURO: Awake, alert, no focal deficits grossly, normal speech  ASSESSMENT/PLAN:  Chronic Low Back Pain:  Likely OA. Due to worsening pain that is not improving with conservative therapy, will obtain MRI lumbar spine for further evaluation. Tramadol 50mg  daily PRN for pain   Chronic Abdominal Pain:  In the past, thought to be due to gastroparesis (cannot find a gastric emptying study). Per patient request, refer to GI for further evaluation.   Smiley Houseman, MD PGY [place]

## 2018-02-21 ENCOUNTER — Other Ambulatory Visit: Payer: Self-pay

## 2018-02-21 ENCOUNTER — Encounter: Payer: Self-pay | Admitting: Internal Medicine

## 2018-02-21 ENCOUNTER — Ambulatory Visit: Payer: Medicare Other | Admitting: Internal Medicine

## 2018-02-21 VITALS — BP 110/64 | HR 54 | Temp 97.8°F | Ht 63.0 in | Wt 154.0 lb

## 2018-02-21 DIAGNOSIS — G8929 Other chronic pain: Secondary | ICD-10-CM | POA: Diagnosis not present

## 2018-02-21 DIAGNOSIS — M545 Low back pain: Secondary | ICD-10-CM

## 2018-02-21 DIAGNOSIS — R109 Unspecified abdominal pain: Secondary | ICD-10-CM

## 2018-02-21 MED ORDER — TRAMADOL HCL 50 MG PO TABS: 50.0000 mg | ORAL_TABLET | Freq: Every day | ORAL | 0 refills | Status: DC | PRN

## 2018-02-21 NOTE — Patient Instructions (Signed)
Let's try Tramadol for your pain.  I made a referral to the GI doctor  We will get an MRI.

## 2018-02-22 ENCOUNTER — Telehealth: Payer: Self-pay

## 2018-02-22 NOTE — Telephone Encounter (Signed)
Received fax from [place] requesting prior authorization of Lidoderm patch. PA submitted via CoverMyMeds. Status pending. May take 1-5 business days. Will check status daily. Danley Danker, RN [facility] RN)

## 2018-02-23 NOTE — Telephone Encounter (Signed)
PA for Lidocaine patch denied by [employer]. Denial in CoverMyMeds states letter will be forthcoming. Danley Danker, RN [facility] RN)

## 2018-02-23 NOTE — Telephone Encounter (Signed)
Noted, we discussed using another medication for pain

## 2018-02-28 ENCOUNTER — Ambulatory Visit (HOSPITAL_COMMUNITY)
Admission: RE | Admit: 2018-02-28 | Discharge: 2018-02-28 | Disposition: A | Discharge status: Home / Self Care | Payer: Medicare Other | Source: Ambulatory Visit | Attending: Family Medicine | Admitting: Family Medicine

## 2018-02-28 DIAGNOSIS — R188 Other ascites: Secondary | ICD-10-CM | POA: Diagnosis not present

## 2018-02-28 DIAGNOSIS — M5136 Other intervertebral disc degeneration, lumbar region: Secondary | ICD-10-CM | POA: Diagnosis not present

## 2018-02-28 DIAGNOSIS — M545 Low back pain: Secondary | ICD-10-CM | POA: Insufficient documentation

## 2018-02-28 DIAGNOSIS — K8020 Calculus of gallbladder without cholecystitis without obstruction: Secondary | ICD-10-CM | POA: Diagnosis not present

## 2018-02-28 DIAGNOSIS — M48061 Spinal stenosis, lumbar region without neurogenic claudication: Secondary | ICD-10-CM | POA: Diagnosis not present

## 2018-02-28 DIAGNOSIS — G8929 Other chronic pain: Secondary | ICD-10-CM | POA: Insufficient documentation

## 2018-02-28 DIAGNOSIS — M419 Scoliosis, unspecified: Secondary | ICD-10-CM | POA: Diagnosis not present

## 2018-03-02 ENCOUNTER — Telehealth: Payer: Self-pay

## 2018-03-02 MED ORDER — TRAMADOL HCL 50 MG PO TABS: 50.0000 mg | ORAL_TABLET | Freq: Every day | ORAL | 0 refills | Status: DC | PRN

## 2018-03-02 NOTE — Telephone Encounter (Signed)
Pt calling for MRI results and also requesting a referral to a GI doctor.  Call back number [phone] Wallace Cullens, RN

## 2018-03-02 NOTE — Telephone Encounter (Signed)
Returned call.   Tramadol refill given. She would like to try PT first before referral to ortho for possible back injections.   Given contact information for [facility] as they attempted to get in touch with patient.

## 2018-03-07 ENCOUNTER — Encounter: Payer: Self-pay | Admitting: Nurse Practitioner

## 2018-03-10 ENCOUNTER — Encounter: Payer: Self-pay | Admitting: Internal Medicine

## 2018-03-15 ENCOUNTER — Observation Stay (HOSPITAL_COMMUNITY): Payer: Medicare Other

## 2018-03-15 ENCOUNTER — Emergency Department (HOSPITAL_COMMUNITY): Payer: Medicare Other

## 2018-03-15 ENCOUNTER — Encounter (HOSPITAL_COMMUNITY): Payer: Self-pay | Admitting: Emergency Medicine

## 2018-03-15 ENCOUNTER — Observation Stay (HOSPITAL_COMMUNITY)
Admission: EM | Admit: 2018-03-15 | Discharge: 2018-03-18 | Disposition: A | Discharge status: Skilled Nursing Facility | Payer: Medicare Other | Attending: Family Medicine | Admitting: Family Medicine

## 2018-03-15 DIAGNOSIS — Z886 Allergy status to analgesic agent status: Secondary | ICD-10-CM | POA: Insufficient documentation

## 2018-03-15 DIAGNOSIS — Z905 Acquired absence of kidney: Secondary | ICD-10-CM | POA: Diagnosis not present

## 2018-03-15 DIAGNOSIS — C7800 Secondary malignant neoplasm of unspecified lung: Secondary | ICD-10-CM | POA: Diagnosis not present

## 2018-03-15 DIAGNOSIS — C259 Malignant neoplasm of pancreas, unspecified: Secondary | ICD-10-CM | POA: Diagnosis not present

## 2018-03-15 DIAGNOSIS — Z888 Allergy status to other drugs, medicaments and biological substances status: Secondary | ICD-10-CM | POA: Insufficient documentation

## 2018-03-15 DIAGNOSIS — R109 Unspecified abdominal pain: Secondary | ICD-10-CM

## 2018-03-15 DIAGNOSIS — Z955 Presence of coronary angioplasty implant and graft: Secondary | ICD-10-CM | POA: Diagnosis not present

## 2018-03-15 DIAGNOSIS — I9589 Other hypotension: Secondary | ICD-10-CM | POA: Diagnosis not present

## 2018-03-15 DIAGNOSIS — C787 Secondary malignant neoplasm of liver and intrahepatic bile duct: Secondary | ICD-10-CM | POA: Insufficient documentation

## 2018-03-15 DIAGNOSIS — M6281 Muscle weakness (generalized): Secondary | ICD-10-CM | POA: Diagnosis not present

## 2018-03-15 DIAGNOSIS — I132 Hypertensive heart and chronic kidney disease with heart failure and with stage 5 chronic kidney disease, or end stage renal disease: Secondary | ICD-10-CM | POA: Insufficient documentation

## 2018-03-15 DIAGNOSIS — N2581 Secondary hyperparathyroidism of renal origin: Secondary | ICD-10-CM | POA: Insufficient documentation

## 2018-03-15 DIAGNOSIS — Z7189 Other specified counseling: Secondary | ICD-10-CM

## 2018-03-15 DIAGNOSIS — E113399 Type 2 diabetes mellitus with moderate nonproliferative diabetic retinopathy without macular edema, unspecified eye: Secondary | ICD-10-CM | POA: Diagnosis not present

## 2018-03-15 DIAGNOSIS — R180 Malignant ascites: Secondary | ICD-10-CM

## 2018-03-15 DIAGNOSIS — Z7902 Long term (current) use of antithrombotics/antiplatelets: Secondary | ICD-10-CM | POA: Diagnosis not present

## 2018-03-15 DIAGNOSIS — E8889 Other specified metabolic disorders: Secondary | ICD-10-CM | POA: Diagnosis not present

## 2018-03-15 DIAGNOSIS — E1122 Type 2 diabetes mellitus with diabetic chronic kidney disease: Secondary | ICD-10-CM | POA: Insufficient documentation

## 2018-03-15 DIAGNOSIS — E43 Unspecified severe protein-calorie malnutrition: Secondary | ICD-10-CM | POA: Diagnosis not present

## 2018-03-15 DIAGNOSIS — Z515 Encounter for palliative care: Secondary | ICD-10-CM

## 2018-03-15 DIAGNOSIS — Z7982 Long term (current) use of aspirin: Secondary | ICD-10-CM | POA: Insufficient documentation

## 2018-03-15 DIAGNOSIS — Z992 Dependence on renal dialysis: Secondary | ICD-10-CM | POA: Insufficient documentation

## 2018-03-15 DIAGNOSIS — K869 Disease of pancreas, unspecified: Secondary | ICD-10-CM | POA: Diagnosis present

## 2018-03-15 DIAGNOSIS — Z79899 Other long term (current) drug therapy: Secondary | ICD-10-CM | POA: Insufficient documentation

## 2018-03-15 DIAGNOSIS — Z66 Do not resuscitate: Secondary | ICD-10-CM | POA: Insufficient documentation

## 2018-03-15 DIAGNOSIS — K8689 Other specified diseases of pancreas: Secondary | ICD-10-CM

## 2018-03-15 DIAGNOSIS — N186 End stage renal disease: Secondary | ICD-10-CM

## 2018-03-15 DIAGNOSIS — Z885 Allergy status to narcotic agent status: Secondary | ICD-10-CM | POA: Diagnosis not present

## 2018-03-15 DIAGNOSIS — E785 Hyperlipidemia, unspecified: Secondary | ICD-10-CM | POA: Insufficient documentation

## 2018-03-15 DIAGNOSIS — R188 Other ascites: Secondary | ICD-10-CM

## 2018-03-15 DIAGNOSIS — R160 Hepatomegaly, not elsewhere classified: Secondary | ICD-10-CM

## 2018-03-15 DIAGNOSIS — Z6826 Body mass index (BMI) 26.0-26.9, adult: Secondary | ICD-10-CM | POA: Insufficient documentation

## 2018-03-15 DIAGNOSIS — I5032 Chronic diastolic (congestive) heart failure: Secondary | ICD-10-CM | POA: Insufficient documentation

## 2018-03-15 DIAGNOSIS — I2510 Atherosclerotic heart disease of native coronary artery without angina pectoris: Secondary | ICD-10-CM | POA: Diagnosis not present

## 2018-03-15 DIAGNOSIS — T82590A Other mechanical complication of surgically created arteriovenous fistula, initial encounter: Secondary | ICD-10-CM

## 2018-03-15 LAB — COMPREHENSIVE METABOLIC PANEL
ALT: 24 U/L (ref 14–54)
ANION GAP: 16 — AB (ref 5–15)
AST: 52 U/L — ABNORMAL HIGH (ref 15–41)
Albumin: 2.4 g/dL — ABNORMAL LOW (ref 3.5–5.0)
Alkaline Phosphatase: 476 U/L — ABNORMAL HIGH (ref 38–126)
BILIRUBIN TOTAL: 1.6 mg/dL — AB (ref 0.3–1.2)
BUN: 38 mg/dL — ABNORMAL HIGH (ref 6–20)
CHLORIDE: 95 mmol/L — AB (ref 101–111)
CO2: 22 mmol/L (ref 22–32)
Calcium: 8.6 mg/dL — ABNORMAL LOW (ref 8.9–10.3)
Creatinine, Ser: 5.61 mg/dL — ABNORMAL HIGH (ref 0.44–1.00)
GFR, EST AFRICAN AMERICAN: 8 mL/min — AB (ref >60)
GFR, EST NON AFRICAN AMERICAN: 7 mL/min — AB (ref >60)
Glucose, Bld: 90 mg/dL (ref 65–99)
POTASSIUM: 3.9 mmol/L (ref 3.5–5.1)
Sodium: 133 mmol/L — ABNORMAL LOW (ref 135–145)
Total Protein: 5 g/dL — ABNORMAL LOW (ref 6.5–8.1)

## 2018-03-15 LAB — CBC WITH DIFFERENTIAL/PLATELET
BASOS ABS: 0 10*3/uL (ref 0.0–0.1)
Basophils Relative: 0 %
EOS PCT: 1 %
Eosinophils Absolute: 0.1 10*3/uL (ref 0.0–0.7)
HEMATOCRIT: 37.3 % (ref 36.0–46.0)
Hemoglobin: 12.8 g/dL (ref 12.0–15.0)
LYMPHS PCT: 14 %
Lymphs Abs: 0.8 10*3/uL (ref 0.7–4.0)
MCH: 30.5 pg (ref 26.0–34.0)
MCHC: 34.3 g/dL (ref 30.0–36.0)
MCV: 88.8 fL (ref 78.0–100.0)
Monocytes Absolute: 0.5 10*3/uL (ref 0.1–1.0)
Monocytes Relative: 8 %
NEUTROS ABS: 4.6 10*3/uL (ref 1.7–7.7)
Neutrophils Relative %: 77 %
PLATELETS: 183 10*3/uL (ref 150–400)
RBC: 4.2 MIL/uL (ref 3.87–5.11)
RDW: 19 % — ABNORMAL HIGH (ref 11.5–15.5)
WBC: 6 10*3/uL (ref 4.0–10.5)

## 2018-03-15 LAB — I-STAT CG4 LACTIC ACID, ED
LACTIC ACID, VENOUS: 2.19 mmol/L — AB (ref 0.5–1.9)
Lactic Acid, Venous: 2.63 mmol/L (ref 0.5–1.9)

## 2018-03-15 LAB — AMMONIA: Ammonia: 17 umol/L (ref 9–35)

## 2018-03-15 LAB — I-STAT TROPONIN, ED: TROPONIN I, POC: 0.05 ng/mL (ref 0.00–0.08)

## 2018-03-15 LAB — POC OCCULT BLOOD, ED: FECAL OCCULT BLD: POSITIVE — AB

## 2018-03-15 LAB — GAMMA GT: GGT: 158 U/L — ABNORMAL HIGH (ref 7–50)

## 2018-03-15 LAB — LIPASE, BLOOD: LIPASE: 16 U/L (ref 11–51)

## 2018-03-15 MED ORDER — ATORVASTATIN CALCIUM 40 MG PO TABS
40.0000 mg | ORAL_TABLET | Freq: Every morning | ORAL | Status: DC
  Administered 2018-03-16: 40 mg via ORAL
  Filled 2018-03-15: qty 1

## 2018-03-15 MED ORDER — MIDODRINE HCL 5 MG PO TABS
10.0000 mg | ORAL_TABLET | Freq: Every day | ORAL | Status: DC
  Administered 2018-03-16: 10 mg via ORAL
  Filled 2018-03-15: qty 2

## 2018-03-15 MED ORDER — RENA-VITE PO TABS
1.0000 | ORAL_TABLET | Freq: Every day | ORAL | Status: DC
  Administered 2018-03-15 – 2018-03-16 (×2): 1 via ORAL
  Filled 2018-03-15 (×2): qty 1

## 2018-03-15 MED ORDER — LORATADINE 10 MG PO TABS
10.0000 mg | ORAL_TABLET | Freq: Every day | ORAL | Status: DC
  Administered 2018-03-15 – 2018-03-17 (×3): 10 mg via ORAL
  Filled 2018-03-15 (×4): qty 1

## 2018-03-15 MED ORDER — CLOPIDOGREL BISULFATE 75 MG PO TABS: 75.0000 mg | ORAL_TABLET | Freq: Every day | ORAL | Status: DC

## 2018-03-15 MED ORDER — NITROGLYCERIN 0.4 MG SL SUBL: 0.4000 mg | SUBLINGUAL_TABLET | SUBLINGUAL | Status: DC | PRN

## 2018-03-15 MED ORDER — HEPARIN SODIUM (PORCINE) 5000 UNIT/ML IJ SOLN: 5000.0000 [IU] | Freq: Three times a day (TID) | INTRAMUSCULAR | Status: DC

## 2018-03-15 MED ORDER — PANTOPRAZOLE SODIUM 40 MG PO TBEC
40.0000 mg | DELAYED_RELEASE_TABLET | Freq: Two times a day (BID) | ORAL | Status: DC
  Administered 2018-03-15 – 2018-03-16 (×3): 40 mg via ORAL
  Filled 2018-03-15 (×3): qty 1

## 2018-03-15 MED ORDER — MORPHINE SULFATE (PF) 4 MG/ML IV SOLN: 4.0000 mg | Freq: Once | INTRAVENOUS | Status: DC

## 2018-03-15 MED ORDER — ASPIRIN 81 MG PO TBEC: 81.0000 mg | DELAYED_RELEASE_TABLET | Freq: Every day | ORAL | Status: DC

## 2018-03-15 MED ORDER — LIDOCAINE 5 % EX PTCH
1.0000 | MEDICATED_PATCH | CUTANEOUS | Status: DC
  Administered 2018-03-15 – 2018-03-17 (×3): 1 via TRANSDERMAL
  Filled 2018-03-15 (×3): qty 1

## 2018-03-15 MED ORDER — HYDROMORPHONE HCL 2 MG/ML IJ SOLN: 0.5000 mg | INTRAMUSCULAR | Status: DC | PRN

## 2018-03-15 MED ORDER — ACETAMINOPHEN 500 MG PO TABS: 500.0000 mg | ORAL_TABLET | Freq: Four times a day (QID) | ORAL | Status: DC | PRN

## 2018-03-15 MED ORDER — OXYCODONE-ACETAMINOPHEN 5-325 MG PO TABS: 1.0000 | ORAL_TABLET | Freq: Four times a day (QID) | ORAL | 0 refills | Status: AC | PRN

## 2018-03-15 MED ORDER — HYDROMORPHONE HCL 1 MG/ML IJ SOLN
0.5000 mg | INTRAMUSCULAR | Status: DC | PRN
  Administered 2018-03-16: 0.5 mg via INTRAVENOUS
  Filled 2018-03-15 (×3): qty 1

## 2018-03-15 MED ORDER — TRAMADOL HCL 50 MG PO TABS: 50.0000 mg | ORAL_TABLET | Freq: Every day | ORAL | Status: DC | PRN

## 2018-03-15 MED ORDER — SEVELAMER CARBONATE 800 MG PO TABS
1600.0000 mg | ORAL_TABLET | Freq: Three times a day (TID) | ORAL | Status: DC
  Administered 2018-03-16: 1600 mg via ORAL
  Filled 2018-03-15 (×2): qty 2

## 2018-03-15 MED ORDER — SEVELAMER CARBONATE 800 MG PO TABS: 1600.0000 mg | ORAL_TABLET | ORAL | Status: DC

## 2018-03-15 MED ORDER — FLUTICASONE PROPIONATE 50 MCG/ACT NA SUSP
1.0000 | Freq: Two times a day (BID) | NASAL | Status: DC
  Administered 2018-03-16: 1 via NASAL
  Filled 2018-03-15: qty 16

## 2018-03-15 NOTE — Discharge Instructions (Addendum)
Unfortunately, your CT scan today shows multiple masses in your abdomen including your pancreas and your liver. This is concerning cancer. You need make an appointment with oncology as soon as you can. Take the pain medication as needed.

## 2018-03-15 NOTE — ED Notes (Signed)
Patient transported to CT 

## 2018-03-15 NOTE — ED Notes (Signed)
ED Provider at bedside. 

## 2018-03-15 NOTE — ED Notes (Signed)
Bair Hugger removed  Temp 98.2 oral

## 2018-03-15 NOTE — ED Notes (Signed)
Pt is a dialysis pt (T, TH, Sat)  Partial dialysis tx on Saturday, none today.

## 2018-03-15 NOTE — H&P (Signed)
[facility] Admission History and Physical Service Pager: [phone]  Patient name: [name] [name] Medical record number: [id number] Date of birth: 05-Jan-1951 Age: 67 y.o. Gender: female  Primary Care Provider: Smiley Houseman, MD Consultants: palliative care, oncology, IR Code Status: partial (DNI)  Chief Complaint: abdominal pain  Assessment and Plan: [name] [name] is a 67 y.o. female presenting with abdominal pain, found to have a mass on her pancreas with likely metastasis. PMH is significant for HFpEF, ESRD, GI bleed, CAD, HTN, HLD, chronic cough.    Abdominal pain 2/2 pancreatic mass:  Patient presents with abdominal pain that has been a chronic issue for her.  She is tender to palpation diffusely across her abdomen and on her back.  Vitals notable for hypotension that has improved without treatment since being in the ED. Per chart review patient has a history of hypotension on midodrine that worsens on HD days. As far as admitting team is aware, patient received HD as scheduled today at SNF.  Labs significant for electrolyte abnormalities likely due to patient's ESRD, alkaline phosphatase of 476, and bilirubin of 1.6. LFTs unremarkable and lipase WNL.  CT abdomen/pelvis showed a 6.3 cm mass on patient's pancreas with likely metastases to liver and lower lobes of lung.  Also notable for large volume peritoneal ascites and anasarca.  Patient's abdominal pain is likely due to this pancreatic mass that appears to be advanced.  Will admit for symptom control and to arrange goals of care. - admit to med-surg, attending Dr. Nori Riis, vital signs per unit  - dilaudid 0.5 mg Q3H PRN  - CT chest without contrast to further evaluate metastasis - palliative care consult - consider IR consult for paracentesis (could help with diagnosis) - consider oncology consult -obtain GGT   Diastolic CHF: Last ECHO 03/19/85: EF 45-50%, G2DD. Did not appreciate crackles on exam,  no pedal edema - management of any volume overload with dialysis - daily weights  ESRD: Receives HD T/Th/Sat. - Continue home Renvela, Rena-Vit - Consult Nephrology in the morning for routine HD  GI bleed: Patient was FOBT + in the ED.  Has gotten an EGD in February 2018 that showed gastric antral vascular ectasias that were thought to be source of bleeding.  Patient also has diverticulosis.  Hgb 12.8 on admission. Patient is not tachycardic. Appears to be stable from this standpoint. Denied GI bleeding per ED provider's note.  - hold ASA, plavix - SCDs for VTE prophylaxis -could consider touching base with GI as patient has outpatient consult scheduled in the near future   CAD s/p PCI w/ 4 drug eluding stents placed in 01/2016: Patient takes aspirin 81 mg and plavix 75 mg at home. - hold home medications d/t positive FOBT  Hypertension with a hx of hypotension: Hypotension worsened on HD days. BP 106/44 on admission. - Continue home Midodrine 10mg  daily, at bedtime on M/W/F and before HD on T/Th/S  Hyperlipidemia: Well-controlled. Last lipid panel 09/2015: chol 93, HDL 34, LDL 43, TG 81 - Continue home Lipitor  Memory loss: unsure what patient's exact level of neurocognitive dysfunction is.  Last seen at [facility] on 02/22/18 without mention that history was provided by a caregiver.  Patient was not alert during our exam, although she was oriented to person and place.  It is possible that patient's mental status is acutely altered on admission compared to baseline. - contact patient's nursing facility and family to learn baseline - ammonia level  Hearing loss:  Patient with significant hearing loss that could also contribute to poor communication.  FEN/GI: regular diet Prophylaxis: SCDs  Disposition: admit to med-surg, attending Dr. Nori Riis  History of Present Illness:  [name] [name] is a 67 y.o. female presenting with abdominal pain. The majority of history was obtained from chart  review due to lack of patient alertness and hard of hearing. Patient reports abdominal pain all over and low back pain. Unable to describe or localize pain. Does say that she "is better" than when she first arrived.   From chart review, patient has been seen recently at [facility] by PCP on 4/8 for chronic abdominal pain with alternating watery diarrhea and constipation with intermittent fecal incontinence. At that time, patient reported decreased appetite and intermittent emesis. Described pain as similar to period cramps. A referral to Gi was placed and an upcoming appointment was scheduled.    Review Of Systems: Per HPI with the following additions: Unable to obtain.   ROS  Patient Active Problem List   Diagnosis Date Noted  . Pancreatic cancer metastasized to lung ([place]) 03/15/2018  . Osteopenia 10/19/2017  . Atelectasis 03/08/2017  . Pleural effusion 01/13/2017  . GAVE (gastric antral vascular ectasia)   . Diverticulosis of colon without hemorrhage   . Acute blood loss anemia   . GI bleed 12/22/2016  . Upper GI bleed 12/22/2016  . Antiplatelet or antithrombotic long-term use   . Cardiomyopathy, ischemic 08/09/2016  . Unstable angina ([place]) 02/04/2016  . Coronary artery disease involving native coronary artery of native heart without angina pectoris 02/03/2016  . Chronic cough 12/11/2015  . Hypoxia 12/03/2015  . Facial cellulitis 12/03/2015  . Angioedema 12/02/2015  . Hyperlipidemia   . Diabetes mellitus type 2 in obese ([place]) 05/07/2015  . CAD in native artery 04/25/2015  . Syncope 04/23/2015  . Moderate nonproliferative diabetic retinopathy(362.05) 07/16/2014  . Memory loss 06/12/2014  . ESRD on dialysis ([place]) 03/16/2013  . Nausea and vomiting 02/14/2013  . Severe anemia 02/14/2013  . Depression 08/28/2011  . Constipation 04/18/2011  . Low blood pressure 04/18/2011    Past Medical History: Past Medical History:  Diagnosis Date  . Anemia   . Coronary artery disease    a. 6/16  Cath: Severe multivessel dzs->not surg candidate 2/2 debility/poor venous conduits->Med Rx; b. 01/2016 Cath/PCI: LM 50ost, LAD 48m (2.5x32 & 2.5x20 Promus Prem DES'), D1 70ost, D2 90, LCX 95p/m (3.0x16 Promus Prem DES), 65d, RCA 60p->d, RPDA 95 (2.5x12 Promus Prem DES).  . Diabetes mellitus ([place])   . ESRD (end stage renal disease) on dialysis ([place])       . Hearing difficulty    Mild   . Hyperlipidemia   . Hypertension   . Hyponatremia   . Ischemic cardiomyopathy    a. Echo 04/2015: EF 35%. Cath same time - 45%.  . Memory loss   . Syncope   . Thrombocytopenia ([place])     Past Surgical History: Past Surgical History:  Procedure Laterality Date  . AV FISTULA PLACEMENT Left 03/07/2013   Procedure: ARTERIOVENOUS (AV) FISTULA CREATION;  Surgeon: Elam Dutch, MD;  Location: [facility];  Service: Vascular;  Laterality: Left;  left brachiocephalic AVF  . CARDIAC CATHETERIZATION N/A 04/25/2015   Procedure: Left Heart Cath and Coronary Angiography;  Surgeon: Belva Crome, MD;  Location: [place] CV LAB;  Service: Cardiovascular;  Laterality: N/A;  . CARDIAC CATHETERIZATION N/A 02/03/2016   Procedure: Coronary Stent Intervention;  Surgeon: Belva Crome, MD;  Location: [facility]  INVASIVE CV LAB;  Service: Cardiovascular;  Laterality: N/A;  . COLONOSCOPY WITH PROPOFOL N/A 12/23/2016   Procedure: COLONOSCOPY WITH PROPOFOL;  Surgeon: Milus Banister, MD;  Location: [place];  Service: Endoscopy;  Laterality: N/A;  . CORONARY STENT PLACEMENT  02/03/2016   LAD X2     CIRCUMFLEX       RCA   . ESOPHAGOGASTRODUODENOSCOPY Left 02/20/2013   Procedure: ESOPHAGOGASTRODUODENOSCOPY (EGD);  Surgeon: Cleotis Nipper, MD;  Location: [facility] ENDOSCOPY;  Service: Endoscopy;  Laterality: Left;  . ESOPHAGOGASTRODUODENOSCOPY (EGD) WITH PROPOFOL N/A 12/23/2016   Procedure: ESOPHAGOGASTRODUODENOSCOPY (EGD) WITH PROPOFOL;  Surgeon: Milus Banister, MD;  Location: [place];  Service: Endoscopy;  Laterality: N/A;  . 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 Smoking status: Never Smoker  . Smokeless tobacco: Never Used  Substance Use Topics  . Alcohol use: No  . Drug use: No   Please also refer to relevant sections of EMR.  Family History: Family History  Problem Relation Age of Onset  . Bone cancer Father        Of jaws   . Throat cancer Father   . Heart attack Father   . Heart disease Unknown   . Diabetes Unknown   . Stroke Unknown   . Heart attack Sister   . Stroke Sister   . Stroke Brother     Allergies and Medications: Allergies  Allergen Reactions  . Aleve [Naproxen Sodium] Other (See Comments)    Due to kidneys.  . Contrast Media [Iodinated Diagnostic Agents] Itching, Swelling and Other (See Comments)    REACTION: Redness Per patient 10/27/17 "Cannot use any type of dye."  . Fentanyl Other (See Comments)    Facial swelling   . Motrin [Ibuprofen] Other (See Comments)    Due to kidneys.  . Nsaids Other (See Comments)    KIDNEY FAILURE AND ONLY HAS PARTIAL LEFT KIDNEY REMAINING (RIGHT ONE TAKEN OUT)  . Tape Other (See Comments)    Please use "paper" tape   No current facility-administered medications on file prior to encounter.    Current Outpatient Medications on File Prior to Encounter  Medication Sig Dispense Refill  . acetaminophen (TYLENOL) 500 MG tablet Take 500 mg by mouth every 6 (six) hours as needed for headache.    Marland Kitchen aspirin 81 MG EC tablet Take 1 tablet (81 mg total) by mouth daily. 30 tablet 0  . atorvastatin (LIPITOR) 40 MG tablet Take 1 tablet (40 mg total) by mouth every morning. 90 tablet 0  . clopidogrel (PLAVIX) 75 MG tablet Take 1 tablet (75 mg total) by mouth daily. 90 tablet 0  . fluticasone (FLONASE) 50 MCG/ACT nasal spray Place 1 spray into both nostrils 2 (two) times daily.    Marland Kitchen lidocaine (LIDODERM) 5 % Place 1 patch onto the skin daily. Remove & Discard patch within 12 hours 30 patch 0  .  loratadine (CLARITIN) 10 MG tablet Take 1 tablet (10 mg total) by mouth daily. 30 tablet 5  . midodrine (PROAMATINE) 10 MG tablet Take one (1) tablet (10 mg total) by mouth at bedtime on Mondays, Wednesdays, and Fridays. Take one (1) tablet (10 mg total) by mouth pre-dialysis on Tuesdays, Thursdays, and Saturdays.  3  . multivitamin (RENA-VIT) TABS tablet Take 1 tablet by mouth at bedtime. 90 tablet 3  . nitroGLYCERIN (NITROSTAT) 0.4 MG SL tablet Place 1 tablet (0.4 mg total) under  the tongue every 5 (five) minutes as needed for chest pain. 25 tablet 0  . pantoprazole (PROTONIX) 40 MG tablet Take 1 tablet (40 mg total) by mouth 2 (two) times daily. 60 tablet 5  . sevelamer carbonate (RENVELA) 800 MG tablet Take 1,600 mg by mouth See admin instructions. Take 2 tablets by mouth three times daily with meals and take 1 tablet with snacks  3  . traMADol (ULTRAM) 50 MG tablet Take 1 tablet (50 mg total) by mouth daily as needed. 10 tablet 0    Objective: BP (!) 106/44   Pulse 70   Temp (S) 98.2 F (36.8 C) (Oral) Comment: Bair Hugger removed  Resp 19   SpO2 95%  Exam: General: lying in bed, sleepy but arouses to voice and tactile stimuli, NAD  Eyes: EOMI, no scleral icterus  ENTM: MMM.  Neck: Supple. Full ROM.  Cardiovascular: RRR. Systolic murmur appreciated.  Respiratory: CTAB. Comfortable WOB.  Gastrointestinal: +BS, soft, abdomen appears distended, diffusely TTP without focal tenderness, no rebound or guarding  MSK: No edema of LE present. Moves all extremities.  Derm: Warm and dry. Hyperpigmented areas on the LE.  Neuro: Alert and oriented to person & place. Hard of hearing.   Labs and Imaging: CBC BMET  Recent Labs  Lab 03/15/18 1136  WBC 6.0  HGB 12.8  HCT 37.3  PLT 183   Recent Labs  Lab 03/15/18 1352  NA 133*  K 3.9  CL 95*  CO2 22  BUN 38*  CREATININE 5.61*  GLUCOSE 90  CALCIUM 8.6*     Ct Abdomen Pelvis Wo Contrast  Result Date: 03/15/2018 CLINICAL DATA:  67  y/o  F; abdominal pain for 2 months. EXAM: CT ABDOMEN AND PELVIS WITHOUT CONTRAST TECHNIQUE: Multidetector CT imaging of the abdomen and pelvis was performed following the standard protocol without IV contrast. COMPARISON:  03/17/2017 PET-CT. 02/28/2018 lumbar spine MRI. 02/15/2013 CT abdomen and pelvis. FINDINGS: Lower chest: Round atelectasis in the left lower lobe. Interval development of numerous pulmonary nodules throughout the lungs measuring up to 8 mm in the right lower lobe superior segment (series 3, image 10). Hepatobiliary: Interval development of multiple ill-defined lucent foci throughout the liver best appreciated at the dome (series 3, image 26). No biliary ductal dilatation. Pancreas: Mass centered within the body of pancreas measuring 6.3 x 3.9 x 3.1 cm (AP x ML x CC series 3, image 46 and series 7, image 105). Directly posteriorly there is a additional mass which appears discrete measuring 3.7 x 22 mm, possibly lymphadenopathy (series 3, image 45). Spleen: Normal in size without focal abnormality. Adrenals/Urinary Tract: Right nephrectomy. Atrophic left kidney. No hydronephrosis. Collapsed bladder. Stomach/Bowel: Stomach is within normal limits. Appendix appears normal. No evidence of bowel wall thickening, distention, or inflammatory changes. Sigmoid diverticulosis without findings of acute diverticulitis. Vascular/Lymphatic: Severe calcific atherosclerosis of the abdominal aorta. No adenopathy. Reproductive: Stable 10 mm calcified uterine myoma. Otherwise negative. Other: Small left inguinal hernia containing ascitic fluid. Moderate to large volume of peritoneal ascites. Diffuse subcutaneous edema compatible with anasarca. Musculoskeletal: No acute osseous abnormality is identified. No lytic or blastic lesion is evident. Multilevel spondylosis. IMPRESSION: 1. Mass centered in body of pancreas measuring 6.3 cm. 2. Multiple lucent foci throughout the liver, lymphadenopathy posterior to pancreas,  and new pulmonary nodules throughout the lungs, probable metastatic disease. 3. Stable round atelectasis in left lower lobe. 4. Moderate to large volume peritoneal ascites.  Anasarca. 5. Sigmoid diverticulosis without findings of acute diverticulitis. 6.  Severe aortic atherosclerosis. Electronically Signed   By: Kristine Garbe M.D.   On: 03/15/2018 18:08   Dg Chest 2 View  Result Date: 03/15/2018 CLINICAL DATA:  Weakness. EXAM: CHEST - 2 VIEW COMPARISON:  Chest x-ray dated February 19, 2017. FINDINGS: Stable mild cardiomegaly. Mild diffuse interstitial thickening. Small left pleural effusion. No consolidation or pneumothorax. No acute osseous abnormality. IMPRESSION: Mild interstitial pulmonary edema and small left pleural effusion. Electronically Signed   By: Titus Dubin M.D.   On: 03/15/2018 15:53    Winfrey, Alcario Drought, MD 03/15/2018, 8:05 PM PGY-1, [place] Intern pager: [phone], text pages welcome  Upper Level Addendum:  I have seen and evaluated this patient along with Dr. Shan Levans and reviewed the above note, making necessary revisions in green.   Phill Myron, D.O. 03/15/2018, 9:05 PM PGY-3, [place]

## 2018-03-15 NOTE — ED Triage Notes (Signed)
Per EMS: pt from [facility] with c/o abdominal pain x2 months. Pt dx with gallstones 2 weeks ago and has a GI appointment scheduled for this coming Monday but pain has increased prompting staff at facility to send pt to ED for evaluation.

## 2018-03-15 NOTE — ED Provider Notes (Signed)
[facility] EMERGENCY DEPARTMENT Provider Note   CSN: [id number] Arrival date & time: 03/15/18  1116     History   Chief Complaint Chief Complaint  Patient presents with  . Abdominal Pain    HPI [name] [name] is a 67 y.o. female.  67yo F w/ extensive PMH including ESRD on HD T/TH/Sat, memory loss, T2DM, HTN, CAD who p/w abdominal pain.  Patient presents from a nursing facility where she reported 2 months of abdominal pain, was diagnosed with gallstones 2 weeks ago and has upcoming GI appointment in 6 days but her pain became too severe to wait.  She is unable to describe the abdominal pain other than that it is across her central abdomen. She has difficulty understanding my questions and states "my sister will give you the information you need."  LEVEL 5 CAVEAT DUE TO HARD OF HEARING AND MEMORY LOSS  The history is provided by the patient and the EMS personnel.  Abdominal Pain      Past Medical History:  Diagnosis Date  . Anemia   . Coronary artery disease    a. 6/16 Cath: Severe multivessel dzs->not surg candidate 2/2 debility/poor venous conduits->Med Rx; b. 01/2016 Cath/PCI: LM 50ost, LAD 34m (2.5x32 & 2.5x20 Promus Prem DES'), D1 70ost, D2 90, LCX 95p/m (3.0x16 Promus Prem DES), 65d, RCA 60p->d, RPDA 95 (2.5x12 Promus Prem DES).  . Diabetes mellitus ([place])   . ESRD (end stage renal disease) on dialysis ([place])       . Hearing difficulty    Mild   . Hyperlipidemia   . Hypertension   . Hyponatremia   . Ischemic cardiomyopathy    a. Echo 04/2015: EF 35%. Cath same time - 45%.  . Memory loss   . Syncope   . Thrombocytopenia [facility])     Patient Active Problem List   Diagnosis Date Noted  . Osteopenia 10/19/2017  . Atelectasis 03/08/2017  . Pleural effusion 01/13/2017  . GAVE (gastric antral vascular ectasia)   . Diverticulosis of colon without hemorrhage   . Acute blood loss anemia   . GI bleed 12/22/2016  . Upper GI bleed 12/22/2016  .  Antiplatelet or antithrombotic long-term use   . Cardiomyopathy, ischemic 08/09/2016  . Unstable angina ([place]) 02/04/2016  . Coronary artery disease involving native coronary artery of native heart without angina pectoris 02/03/2016  . Chronic cough 12/11/2015  . Hypoxia 12/03/2015  . Facial cellulitis 12/03/2015  . Angioedema 12/02/2015  . Hyperlipidemia   . Diabetes mellitus type 2 in obese ([place]) 05/07/2015  . CAD in native artery 04/25/2015  . Syncope 04/23/2015  . Moderate nonproliferative diabetic retinopathy(362.05) 07/16/2014  . Memory loss 06/12/2014  . ESRD on dialysis ([place]) 03/16/2013  . Nausea and vomiting 02/14/2013  . Severe anemia 02/14/2013  . Depression 08/28/2011  . Constipation 04/18/2011  . Low blood pressure 04/18/2011    Past Surgical History:  Procedure Laterality Date  . AV FISTULA PLACEMENT Left 03/07/2013   Procedure: ARTERIOVENOUS (AV) FISTULA CREATION;  Surgeon: Elam Dutch, MD;  Location: [place];  Service: Vascular;  Laterality: Left;  left brachiocephalic AVF  . CARDIAC CATHETERIZATION N/A 04/25/2015   Procedure: Left Heart Cath and Coronary Angiography;  Surgeon: Belva Crome, MD;  Location: [place] CV LAB;  Service: Cardiovascular;  Laterality: N/A;  . CARDIAC CATHETERIZATION N/A 02/03/2016   Procedure: Coronary Stent Intervention;  Surgeon: Belva Crome, MD;  Location: [place] CV LAB;  Service: Cardiovascular;  Laterality: N/A;  .  COLONOSCOPY WITH PROPOFOL N/A 12/23/2016   Procedure: COLONOSCOPY WITH PROPOFOL;  Surgeon: Milus Banister, MD;  Location: [place];  Service: Endoscopy;  Laterality: N/A;  . CORONARY STENT PLACEMENT  02/03/2016   LAD X2     CIRCUMFLEX       RCA   . ESOPHAGOGASTRODUODENOSCOPY Left 02/20/2013   Procedure: ESOPHAGOGASTRODUODENOSCOPY (EGD);  Surgeon: Cleotis Nipper, MD;  Location: [facility] ENDOSCOPY;  Service: Endoscopy;  Laterality: Left;  . ESOPHAGOGASTRODUODENOSCOPY (EGD) WITH PROPOFOL N/A 12/23/2016   Procedure:  ESOPHAGOGASTRODUODENOSCOPY (EGD) WITH PROPOFOL;  Surgeon: Milus Banister, MD;  Location: [place];  Service: Endoscopy;  Laterality: N/A;  . 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 Authorizing Provider  acetaminophen (TYLENOL) 500 MG tablet Take 500 mg by mouth every 6 (six) hours as needed for headache.   Yes [provider]  aspirin 81 MG EC tablet Take 1 tablet (81 mg total) by mouth daily. 12/26/16  Yes Greendale Bing, DO  atorvastatin (LIPITOR) 40 MG tablet Take 1 tablet (40 mg total) by mouth every morning. 08/16/17  Yes Smiley Houseman, MD  clopidogrel (PLAVIX) 75 MG tablet Take 1 tablet (75 mg total) by mouth daily. 12/01/17  Yes Belva Crome, MD  fluticasone [facility]) 50 MCG/ACT nasal spray Place 1 spray into both nostrils 2 (two) times daily.   Yes [provider]  lidocaine (LIDODERM) 5 % Place 1 patch onto the skin daily. Remove & Discard patch within 12 hours 02/18/18  Yes Smiley Houseman, MD  loratadine (CLARITIN) 10 MG tablet Take 1 tablet (10 mg total) by mouth daily. 01/10/18  Yes Collene Gobble, MD  midodrine (PROAMATINE) 10 MG tablet Take one (1) tablet (10 mg total) by mouth at bedtime on Mondays, Wednesdays, and Fridays. Take one (1) tablet (10 mg total) by mouth pre-dialysis on Tuesdays, Thursdays, and Saturdays. 11/18/15  Yes [provider]  multivitamin (RENA-VIT) TABS tablet Take 1 tablet by mouth at bedtime. 09/04/14  Yes Leone Haven, MD  nitroGLYCERIN (NITROSTAT) 0.4 MG SL tablet Place 1 tablet (0.4 mg total) under the tongue every 5 (five) minutes as needed for chest pain. 12/01/17  Yes Belva Crome, MD  pantoprazole (PROTONIX) 40 MG tablet Take 1 tablet (40 mg total) by mouth 2 (two) times daily. 01/10/18  Yes Collene Gobble, MD  sevelamer carbonate (RENVELA) 800 MG  tablet Take 1,600 mg by mouth See admin instructions. Take 2 tablets by mouth three times daily with meals and take 1 tablet with snacks 01/11/18  Yes [provider]  traMADol (ULTRAM) 50 MG tablet Take 1 tablet (50 mg total) by mouth daily as needed. 03/02/18  Yes Smiley Houseman, MD    Family History Family History  Problem Relation Age of Onset  . Bone cancer Father        Of jaws   . Throat cancer Father   . Heart attack Father   . Heart disease Unknown   . Diabetes Unknown   . Stroke Unknown   . Heart attack Sister   . Stroke Sister   . Stroke Brother     Social History Social History   Tobacco Use  . Smoking status: Never Smoker  . Smokeless tobacco: Never Used  Substance Use Topics  . Alcohol  use: No  . Drug use: No     Allergies   Aleve [naproxen sodium]; Contrast media [iodinated diagnostic agents]; Fentanyl; Motrin [ibuprofen]; Nsaids; and Tape   Review of Systems Review of Systems  Unable to perform ROS: Other  Gastrointestinal: Positive for abdominal pain.  Patient hard of hearing and has memory loss   Physical Exam Updated Vital Signs BP (!) 102/50   Pulse 71   Temp (S) 98.2 F (36.8 C) (Oral) Comment: Bair Hugger removed  Resp (!) 25   SpO2 97%   Physical Exam  Constitutional: She appears well-developed and well-nourished.  Mildly anxious  HENT:  Head: Normocephalic and atraumatic.  dry mucous membranes  Eyes: Pupils are equal, round, and reactive to light. Conjunctivae are normal.  Neck: Neck supple.  Cardiovascular: Normal rate and regular rhythm.  Murmur heard. Pulmonary/Chest: Effort normal and breath sounds normal.  Abdominal: Soft. Bowel sounds are normal. She exhibits no distension. There is no tenderness.  No focal abdominal tenderness  Musculoskeletal: She exhibits no edema.  Neurological: She is alert.  Slightly garbled and difficult to understand speech   Skin: Skin is warm and dry.  Psychiatric: Her mood  appears anxious.  Nursing note and vitals reviewed.    ED Treatments / Results  Labs (all labs ordered are listed, but only abnormal results are displayed) Labs Reviewed  CBC WITH DIFFERENTIAL/PLATELET - Abnormal; Notable for the following components:      Result Value   RDW 19.0 (*)    All other components within normal limits  COMPREHENSIVE METABOLIC PANEL - Abnormal; Notable for the following components:   Sodium 133 (*)    Chloride 95 (*)    BUN 38 (*)    Creatinine, Ser 5.61 (*)    Calcium 8.6 (*)    Total Protein 5.0 (*)    Albumin 2.4 (*)    AST 52 (*)    Alkaline Phosphatase 476 (*)    Total Bilirubin 1.6 (*)    GFR calc non Af Amer 7 (*)    GFR calc Af Amer 8 (*)    Anion gap 16 (*)    All other components within normal limits  I-STAT CG4 LACTIC ACID, ED - Abnormal; Notable for the following components:   Lactic Acid, Venous 2.63 (*)    All other components within normal limits  POC OCCULT BLOOD, ED - Abnormal; Notable for the following components:   Fecal Occult Bld POSITIVE (*)    All other components within normal limits  I-STAT CG4 LACTIC ACID, ED - Abnormal; Notable for the following components:   Lactic Acid, Venous 2.19 (*)    All other components within normal limits  LIPASE, BLOOD  I-STAT TROPONIN, ED    EKG EKG Interpretation  Date/Time:  Tuesday March 15 2018 11:42:37 EDT Ventricular Rate:  64 PR Interval:    QRS Duration: 106 QT Interval:  465 QTC Calculation: 480 R Axis:   61 Text Interpretation:  Sinus rhythm Low voltage, extremity leads Nonspecific T abnormalities, lateral leads Minimal ST elevation, inferior leads since previous tracing, T wave inversions inferiorly have improved Confirmed by Theotis Burrow [phone]) on 03/15/2018 12:07:23 PM   Radiology Dg Chest 2 View  Result Date: 03/15/2018 CLINICAL DATA:  Weakness. EXAM: CHEST - 2 VIEW COMPARISON:  Chest x-ray dated February 19, 2017. FINDINGS: Stable mild cardiomegaly. Mild diffuse  interstitial thickening. Small left pleural effusion. No consolidation or pneumothorax. No acute osseous abnormality. IMPRESSION: Mild interstitial pulmonary edema and small left  pleural effusion. Electronically Signed   By: Titus Dubin M.D.   On: 03/15/2018 15:53    Procedures Procedures (including critical care time)  Medications Ordered in ED Medications - No data to display   Initial Impression / Assessment and Plan / ED Course  I have reviewed the triage vital signs and the nursing notes.  Pertinent labs & imaging results that were available during my care of the patient were reviewed by me and considered in my medical decision making (see chart for details).     No focal abdominal tenderness on exam.  I reviewed her recent PCP visit which notes chronic abdominal pain: "this is a chronic symptom with alternating watery diarrhea and constipation. When she has diarrhea she does have intermittent fecal incontinence.  - she reports that she was seen by GI for this "years ago but they could not find a reason for her symptoms".  - she reports of decreased appetite. Intermittent emesis.  - she describes her abdominal pain as similar to "period cramps"  - per chart review, her symptoms were thought to be possibly due to gastroparesis "  Obtained CT and labs today to r/o acute intraabdominal process.  Labs show potassium 3.9 normal lipase, reassuring LFTs.  Her Hemoccult was positive but she has a normal CBC with hemoglobin 12.8 and is not complaining of any GI bleeding.  Is not on Coumadin, Xarelto, Eliquis, or Pradaxa.  Her description of symptoms in PCP note from earlier this month suggests a chronic rather than acute problem.  Her CT is pending.  She already has referral to GI.  I am signing out to the oncoming provider, I anticipate discharge if CT is reassuring. Final Clinical Impressions(s) / ED Diagnoses   Final diagnoses:  Abdominal pain, unspecified abdominal location    ED  Discharge Orders    None       Rudolpho Claxton, Wenda Overland, MD 03/15/18 1649

## 2018-03-15 NOTE — ED Notes (Addendum)
Patient transported to CT. Pt to be transported upstairs after CT.

## 2018-03-16 DIAGNOSIS — R630 Anorexia: Secondary | ICD-10-CM

## 2018-03-16 DIAGNOSIS — Z515 Encounter for palliative care: Secondary | ICD-10-CM

## 2018-03-16 DIAGNOSIS — K869 Disease of pancreas, unspecified: Secondary | ICD-10-CM

## 2018-03-16 DIAGNOSIS — Z79899 Other long term (current) drug therapy: Secondary | ICD-10-CM

## 2018-03-16 DIAGNOSIS — K8689 Other specified diseases of pancreas: Secondary | ICD-10-CM

## 2018-03-16 DIAGNOSIS — E43 Unspecified severe protein-calorie malnutrition: Secondary | ICD-10-CM | POA: Diagnosis not present

## 2018-03-16 DIAGNOSIS — R160 Hepatomegaly, not elsewhere classified: Secondary | ICD-10-CM | POA: Diagnosis not present

## 2018-03-16 DIAGNOSIS — Z992 Dependence on renal dialysis: Secondary | ICD-10-CM | POA: Diagnosis not present

## 2018-03-16 DIAGNOSIS — Z7189 Other specified counseling: Secondary | ICD-10-CM

## 2018-03-16 DIAGNOSIS — R180 Malignant ascites: Secondary | ICD-10-CM | POA: Diagnosis not present

## 2018-03-16 DIAGNOSIS — E785 Hyperlipidemia, unspecified: Secondary | ICD-10-CM | POA: Diagnosis not present

## 2018-03-16 DIAGNOSIS — R109 Unspecified abdominal pain: Secondary | ICD-10-CM | POA: Diagnosis not present

## 2018-03-16 DIAGNOSIS — I255 Ischemic cardiomyopathy: Secondary | ICD-10-CM

## 2018-03-16 DIAGNOSIS — I129 Hypertensive chronic kidney disease with stage 1 through stage 4 chronic kidney disease, or unspecified chronic kidney disease: Secondary | ICD-10-CM

## 2018-03-16 DIAGNOSIS — E1122 Type 2 diabetes mellitus with diabetic chronic kidney disease: Secondary | ICD-10-CM

## 2018-03-16 DIAGNOSIS — C7800 Secondary malignant neoplasm of unspecified lung: Secondary | ICD-10-CM

## 2018-03-16 DIAGNOSIS — N186 End stage renal disease: Secondary | ICD-10-CM | POA: Diagnosis not present

## 2018-03-16 DIAGNOSIS — C259 Malignant neoplasm of pancreas, unspecified: Secondary | ICD-10-CM | POA: Diagnosis not present

## 2018-03-16 LAB — COMPREHENSIVE METABOLIC PANEL
ALT: 22 U/L (ref 14–54)
AST: 49 U/L — ABNORMAL HIGH (ref 15–41)
Albumin: 2.2 g/dL — ABNORMAL LOW (ref 3.5–5.0)
Alkaline Phosphatase: 418 U/L — ABNORMAL HIGH (ref 38–126)
Anion gap: 13 (ref 5–15)
BILIRUBIN TOTAL: 1 mg/dL (ref 0.3–1.2)
BUN: 44 mg/dL — ABNORMAL HIGH (ref 6–20)
CALCIUM: 8.4 mg/dL — AB (ref 8.9–10.3)
CO2: 24 mmol/L (ref 22–32)
Chloride: 98 mmol/L — ABNORMAL LOW (ref 101–111)
Creatinine, Ser: 6.19 mg/dL — ABNORMAL HIGH (ref 0.44–1.00)
GFR calc non Af Amer: 6 mL/min — ABNORMAL LOW (ref >60)
GFR, EST AFRICAN AMERICAN: 7 mL/min — AB (ref >60)
Glucose, Bld: 75 mg/dL (ref 65–99)
POTASSIUM: 3.5 mmol/L (ref 3.5–5.1)
Sodium: 135 mmol/L (ref 135–145)
TOTAL PROTEIN: 4.6 g/dL — AB (ref 6.5–8.1)

## 2018-03-16 LAB — CBC
HEMATOCRIT: 30.5 % — AB (ref 36.0–46.0)
Hemoglobin: 10.6 g/dL — ABNORMAL LOW (ref 12.0–15.0)
MCH: 30.5 pg (ref 26.0–34.0)
MCHC: 34.8 g/dL (ref 30.0–36.0)
MCV: 87.6 fL (ref 78.0–100.0)
Platelets: 154 10*3/uL (ref 150–400)
RBC: 3.48 MIL/uL — ABNORMAL LOW (ref 3.87–5.11)
RDW: 17.9 % — ABNORMAL HIGH (ref 11.5–15.5)
WBC: 5.3 10*3/uL (ref 4.0–10.5)

## 2018-03-16 LAB — GLUCOSE, CAPILLARY: GLUCOSE-CAPILLARY: 77 mg/dL (ref 65–99)

## 2018-03-16 LAB — MRSA PCR SCREENING: MRSA BY PCR: POSITIVE — AB

## 2018-03-16 MED ORDER — MUPIROCIN 2 % EX OINT
1.0000 "application " | TOPICAL_OINTMENT | Freq: Two times a day (BID) | CUTANEOUS | Status: DC
  Administered 2018-03-16 (×2): 1 via NASAL
  Filled 2018-03-16 (×2): qty 22

## 2018-03-16 MED ORDER — CHLORHEXIDINE GLUCONATE CLOTH 2 % EX PADS
6.0000 | MEDICATED_PAD | Freq: Every day | CUTANEOUS | Status: DC
  Administered 2018-03-16 – 2018-03-17 (×2): 6 via TOPICAL

## 2018-03-16 NOTE — Progress Notes (Addendum)
Family Medicine Teaching Service Daily Progress Note Intern Pager: [phone]  Patient name: [name] [name] Medical record number: [id number] Date of birth: 07-Mar-1951 Age: 67 y.o. Gender: female  Primary Care Provider: Smiley Houseman, MD Consultants: palliative/oncology/IR Code Status: partial (DNI)  Pt Overview and Major Events to Date:  [name] [name] is a 67 y.o. female presenting with abdominal pain, found to have a mass on her pancreas with likely metastasis. PMH is significant for HFpEF, ESRD, GI bleed, CAD, HTN, HLD, chronic cough.    Assessment and Plan: [name] [name] is a 67 y.o. female presenting with abdominal pain, found to have a mass on her pancreas with likely metastasis. PMH is significant for HFpEF, ESRD, GI bleed, CAD, HTN, HLD, chronic cough.    Abdominal pain 2/2 pancreatic mass:  Patient presents with abdominal pain that has been a chronic issue for her.  She is tender to palpation diffusely across her abdomen and on her back.  Labs significant for electrolyte abnormalities likely due to patient's ESRD, alkaline phosphatase of 476, and bilirubin of 1.6. LFTs unremarkable and lipase WNL.  CT abdomen/pelvis showed a 6.3 cm mass on patient's pancreas with likely metastases to liver and lower lobes of lung.  Also notable for large volume peritoneal ascites and anasarca.  Patient's abdominal pain is likely due to this pancreatic mass that appears to be advanced.  Will admit for symptom control and to arrange goals of care. - admit to med-surg, attending Dr. Nori Riis, vital signs per unit  - dilaudid 0.5 mg Q3H PRN  - CT chest without contrast to further evaluate metastasis - palliative care consulted - will consider IR consult for paracentesis (could help with diagnosis) after speaking with Onc -oncology consulted  Diastolic CHF: Last ECHO 0/04/3015: EF 45-50%, G2DD. Did not appreciate crackles on exam, no pedal edema - management of any volume overload with dialysis -  daily weights  ESRD: Receives HD T/Th/Sat.  DID NOT RECEIVE Tuesday HD - Continue home Renvela, Rena-Vit  - consulted nephro for HD  GI bleed: Patient was FOBT + in the ED.  Has gotten an EGD in February 2018 that showed gastric antral vascular ectasias that were thought to be source of bleeding.  Patient also has diverticulosis.  Hgb 12.8 on admission. Patient is not tachycardic. Appears to be stable from this standpoint. Denied GI bleeding per ED provider's note.  - hold ASA, plavix - SCDs for VTE prophylaxis -could consider touching base with GI as patient has outpatient consult scheduled in the near future   CAD s/p PCI w/ 4 drug eluding stents placed in 01/2016: Patient takes aspirin 81 mg and plavix 75 mg at home. - hold home medications d/t positive FOBT  Hypertension with a hx of hypotension: Hypotension worsened on HD days. BP 106/44 on admission. - Continue home Midodrine 10mg  daily, at bedtime on M/W/F and before HD on T/Th/S  Hyperlipidemia: Well-controlled. Last lipid panel 09/2015: chol 93, HDL 34, LDL 43, TG 81 - Continue home Lipitor  Memory loss: still some confusion although AOx3 am 5/1  unsure what patient's exact level of baseline  neurocognitive dysfunction is.  Last seen at [facility] on 02/22/18 without mention that history was provided by a caregiver.  Patient was not alert during our exam, although she was oriented to person and place.  It is possible that patient's mental status is acutely altered on admission compared to baseline.Ammonia WNL - contact patient's nursing facility and family to learn baseline  Hearing loss: Patient with significant hearing loss that could also contribute to poor communication.  FEN/GI: regular diet Prophylaxis: SCDs  Disposition: admit to med-surg, attending Dr. Nori Riis  Subjective:  Patient still with mild abdominal discomfort.   Concerned about CT findings, gives permission to speak with sister about all care.  Much more  alert today, does not remember a lot of yesterday  Objective: Temp:  [97.8 F (36.6 C)-98.2 F (36.8 C)] 97.8 F (36.6 C) (05/01 0526) Pulse Rate:  [25-76] 58 (05/01 0526) Resp:  [14-28] 16 (05/01 0526) BP: (81-114)/(35-89) 111/48 (05/01 0526) SpO2:  [94 %-100 %] 99 % (05/01 0526) Weight:  [149 lb 11.1 oz (67.9 kg)] 149 lb 11.1 oz (67.9 kg) (05/01 0500) Physical Exam: General: lying in bed, pleasant and conversational albeit occasionally confused Eyes: EOMI, no scleral icterus  ENTM: MMM.  Neck: Supple. Full ROM.  Cardiovascular: RRR. Systolic murmur appreciated.  Respiratory: CTAB. Comfortable WOB.  Gastrointestinal: +BS, soft, abdomen appears distended, diffusely mildly TTP without focal tenderness, no rebound or guarding, does have moderate fluid ascities MSK: No edema of LE present. Moves all extremities.  Derm: Warm and dry. Hyperpigmented areas on the LE.  Neuro: Alert and oriented to person & place. Hard of hearing.    Laboratory: Recent Labs  Lab 03/15/18 1136 03/16/18 0406  WBC 6.0 5.3  HGB 12.8 10.6*  HCT 37.3 30.5*  PLT 183 154   Recent Labs  Lab 03/15/18 1352 03/16/18 0406  NA 133* 135  K 3.9 3.5  CL 95* 98*  CO2 22 24  BUN 38* 44*  CREATININE 5.61* 6.19*  CALCIUM 8.6* 8.4*  PROT 5.0* 4.6*  BILITOT 1.6* 1.0  ALKPHOS 476* 418*  ALT 24 22  AST 52* 49*  GLUCOSE 90 75    Imaging/Diagnostic Tests: Ct Abdomen Pelvis Wo Contrast  Result Date: 03/15/2018 CLINICAL DATA:  67 y/o  F; abdominal pain for 2 months. EXAM: CT ABDOMEN AND PELVIS WITHOUT CONTRAST TECHNIQUE: Multidetector CT imaging of the abdomen and pelvis was performed following the standard protocol without IV contrast. COMPARISON:  03/17/2017 PET-CT. 02/28/2018 lumbar spine MRI. 02/15/2013 CT abdomen and pelvis. FINDINGS: Lower chest: Round atelectasis in the left lower lobe. Interval development of numerous pulmonary nodules throughout the lungs measuring up to 8 mm in the right lower lobe  superior segment (series 3, image 10). Hepatobiliary: Interval development of multiple ill-defined lucent foci throughout the liver best appreciated at the dome (series 3, image 26). No biliary ductal dilatation. Pancreas: Mass centered within the body of pancreas measuring 6.3 x 3.9 x 3.1 cm (AP x ML x CC series 3, image 46 and series 7, image 105). Directly posteriorly there is a additional mass which appears discrete measuring 3.7 x 22 mm, possibly lymphadenopathy (series 3, image 45). Spleen: Normal in size without focal abnormality. Adrenals/Urinary Tract: Right nephrectomy. Atrophic left kidney. No hydronephrosis. Collapsed bladder. Stomach/Bowel: Stomach is within normal limits. Appendix appears normal. No evidence of bowel wall thickening, distention, or inflammatory changes. Sigmoid diverticulosis without findings of acute diverticulitis. Vascular/Lymphatic: Severe calcific atherosclerosis of the abdominal aorta. No adenopathy. Reproductive: Stable 10 mm calcified uterine myoma. Otherwise negative. Other: Small left inguinal hernia containing ascitic fluid. Moderate to large volume of peritoneal ascites. Diffuse subcutaneous edema compatible with anasarca. Musculoskeletal: No acute osseous abnormality is identified. No lytic or blastic lesion is evident. Multilevel spondylosis. IMPRESSION: 1. Mass centered in body of pancreas measuring 6.3 cm. 2. Multiple lucent foci throughout the liver, lymphadenopathy posterior to pancreas,  and new pulmonary nodules throughout the lungs, probable metastatic disease. 3. Stable round atelectasis in left lower lobe. 4. Moderate to large volume peritoneal ascites.  Anasarca. 5. Sigmoid diverticulosis without findings of acute diverticulitis. 6. Severe aortic atherosclerosis. Electronically Signed   By: Kristine Garbe M.D.   On: 03/15/2018 18:08   Dg Chest 2 View  Result Date: 03/15/2018 CLINICAL DATA:  Weakness. EXAM: CHEST - 2 VIEW COMPARISON:  Chest x-ray  dated February 19, 2017. FINDINGS: Stable mild cardiomegaly. Mild diffuse interstitial thickening. Small left pleural effusion. No consolidation or pneumothorax. No acute osseous abnormality. IMPRESSION: Mild interstitial pulmonary edema and small left pleural effusion. Electronically Signed   By: Titus Dubin M.D.   On: 03/15/2018 15:53   Ct Chest Wo Contrast  Result Date: 03/15/2018 CLINICAL DATA:  Pancreatic mass on CT abdomen/pelvis, adenocarcinoma suspected EXAM: CT CHEST WITHOUT CONTRAST TECHNIQUE: Multidetector CT imaging of the chest was performed following the standard protocol without IV contrast. COMPARISON:  Partial comparison to CT abdomen/pelvis dated 03/15/2018. CT chest dated 07/05/2017. FINDINGS: Cardiovascular: Mild cardiomegaly.  No pericardial effusion. No evidence of thoracic aortic aneurysm. Atherosclerotic calcifications of the aortic arch. Three vessel coronary atherosclerosis. Mediastinum/Nodes: No suspicious mediastinal lymphadenopathy. Visualized thyroid is unremarkable. Lungs/Pleura: Evaluation lung parenchyma is constrained by respiratory motion. Multiple bilateral pulmonary nodules, suspicious for metastases. Representative nodules include: --8 mm nodule in the right upper lobe (series 8/image 17) --8 mm nodule in the right upper lobe (series 8/image 24) --6 mm nodule in the superior segment left lower lobe (series 8/image 41) --8 mm nodule in right lower lobe (series 8/image 43) --9 mm nodule in the right lower lobe (series 8/image 46) Rounded atelectasis in the left lower lobe, chronic. No focal consolidation. No pleural effusion or pneumothorax. Upper Abdomen: Visualized upper abdomen is better evaluated on recent dedicated CT abdomen/pelvis, noting an incompletely visualized pancreatic mass (series 4/image 139). Musculoskeletal: Visualized osseous structures are within normal limits. IMPRESSION: Multiple bilateral pulmonary nodules, measuring up to 9 mm in the right lower lobe,  suspicious for metastases. Pancreatic mass is incompletely visualized, better evaluated on recent CT abdomen/pelvis. Aortic Atherosclerosis (ICD10-I70.0). Electronically Signed   By: Julian Hy M.D.   On: 03/15/2018 21:38     Sherene Sires, DO 03/16/2018, 7:16 AM PGY-1, [place] Intern pager: [phone], text pages welcome

## 2018-03-16 NOTE — Consult Note (Signed)
Consultation Note Date: 03/16/2018   Patient Name: [name] [name]  DOB: [birth year]  MRN: [id number]  Age / Sex: 67 y.o., female  PCP: Smiley Houseman, MD Referring Physician: Dickie La, MD  Reason for Consultation: Establishing goals of care  HPI/Patient Profile: 67 y.o. female  with past medical history of ESRD on HD, CHF, GI bleed, CAD, HTN. HLD and hearing loss admitted on 03/15/2018 with abdominal pain. Patient found to have a pancreatic mass with suspected lung and liver mets. Ascites and anasarca present. Patient lives in independent living with her sister, [name] [name].  PMT consulted for [facility].  Clinical Assessment and Goals of Care: I have reviewed medical records including EPIC notes, labs and imaging, received report from Dr. Criss Rosales, assessed the patient and then met at the bedside along with Sister/HCPOA, [name] [name], patient's brother and sister in law to discuss diagnosis prognosis, GOC, EOL wishes, disposition and options.  Upon entering room, patient had just received pain medicine and was resting well.  Did not awaken during conversation.   I introduced Palliative Medicine as specialized medical care for people living with serious illness. It focuses on providing relief from the symptoms and stress of a serious illness. The goal is to improve quality of life for both the patient and the family.  We discussed a brief life review of the patient. The family tells me the patient has had a hard life, but the past 3 years have been the best years of her life. She enjoys living at [facility] with her sister and enjoys spending time with the other residents. They play games often and she helps in [employer].   As far as functional and nutritional status, the family describes a significant decline in the past 2 months. She spent more time asleep than awake. She was still ambulatory and could complete ADLs. Her appetite had declined and she had  lost 20 pounds in 2 months. (Albumin 2.2) Her cognitive status had also declined, she had become very forgetful and easily confused. No longer able to participate in games with other residents she once enjoyed.   We discussed her current illness and what it means in the larger context of her on-going co-morbidities.  Family understands that patient likely has metastatic pancreatic cancer. Natural disease trajectory and expectations at EOL were discussed.   The difference between aggressive medical intervention and comfort care was considered in light of the patient's goals of care. The family is interested in pursuing comfort care. They feel she is not strong enough for aggressive cancer treatment and worry it would lessen her quality of life. They want to discuss this with the patient tomorrow.   Hospice and Palliative Care services outpatient were explained and offered.  Questions and concerns were addressed. The family was encouraged to call with questions or concerns.   Primary Decision Maker PATIENT, if patient unable to make decisions sister, [name] [name] is HCPOA    SUMMARY OF RECOMMENDATIONS   -Meeting scheduled with family tomorrow 5/2 at 68 AM - hopeful patient is more awake and able to participate. Sister and Chauncey Reading will be present along with patient's brother and sister in law - Family leaning towards comfort care, hopeful patient is able to communicate her desires during meeting tomorrow - will discuss code status tomorrow w/patient -Family is interested in hospice care - however patient is on HD so will need to discuss potential discontinuation of HD with patient and family  Code Status/Advance Care Planning:  Limited code -  no intubation   Symptom Management:   Pain controlled currently with dilaudid per family, no N/V, resting comfortably during my visit  Palliative Prophylaxis:   Aspiration, Bowel Regimen, Delirium Protocol, Frequent Pain Assessment and Turn  Reposition  Prognosis:   Unable to determine  Discharge Planning: To Be Determined      Primary Diagnoses: Present on Admission: . Pancreatic cancer metastasized to lung [facility])   I have reviewed the medical record, interviewed the patient and family, and examined the patient. The following aspects are pertinent.  Past Medical History:  Diagnosis Date  . Anemia   . Coronary artery disease    a. 6/16 Cath: Severe multivessel dzs->not surg candidate 2/2 debility/poor venous conduits->Med Rx; b. 01/2016 Cath/PCI: LM 50ost, LAD 61m(2.5x32 & 2.5x20 Promus Prem DES'), D1 70ost, D2 90, LCX 95p/m (3.0x16 Promus Prem DES), 65d, RCA 60p->d, RPDA 95 (2.5x12 Promus Prem DES).  . Diabetes [place] ([facility]   . [place] ([place]       . Hearing difficulty    Mild   . Hyperlipidemia   . Hypertension   . Hyponatremia   . Ischemic cardiomyopathy    a. Echo 04/2015: EF 35%. Cath same time - 45%.  . Memory loss   . Syncope   . [place]rombocyt[place]    Social History   Socioeconomic History  . Marital status: Widowed    Spouse name: Not on file  . Number of children: Not on file  . Years of education: Not on file  . Highest education level: Not on file  Occupational History  . Not on file  Social Needs  . Financial resource strain: Not on file  . Food insecurity:    Worry: Not on file    Inability: Not on file  . Transportation needs:    Medical: Not on file    Non-medical: Not on file  Tobacco Use  . Smoking status: Never Smoker  . Smokeless tobacco: Never Used  Substance and Sexual Activity  . Alcohol use: No  . Drug use: No  . Sexual activity: Not Currently    Birth control/protection: Abstinence  Lifestyle  . Physical activity:    Days per week: Not on file    Minutes per session: Not on file  . Stress: Not on file  Relationships  . Social connections:    Talks on phone: Not on file    Gets together: Not on file    Attends religious  service: Not on file    Active member of club or organization: Not on file    Attends meetings of clubs or organizations: Not on file    Relationship status: Not on file  Other Topics Concern  . Not on file  Social History Narrative      Denies alcohol, tobacco (never smoked), other drugs   Hobbies: reading      Family History  Problem Relation Age of Onset  . Bone cancer Father        Of jaws   . Throat cancer Father   . Heart attack Father   . Heart disease Unknown   . Diabetes Unknown   . Stroke Unknown   . Heart attack Sister   . Stroke Sister   . Stroke Brother    Scheduled Meds: . atorvastatin  40 mg Oral q morning - 10a  . Chlorhexidine Gluconate Cloth  6 each Topical Q0600  . fluticasone  1 spray Each Nare BID  .  lidocaine  1 patch Transdermal Q24H  . loratadine  10 mg Oral Daily  . midodrine  10 mg Oral Daily  . multivitamin  1 tablet Oral QHS  . mupirocin ointment  1 application Nasal BID  . pantoprazole  40 mg Oral BID  . sevelamer carbonate  1,600 mg Oral TID WC   Continuous Infusions: PRN Meds:.acetaminophen, HYDROmorphone (DILAUDID) injection Allergies  Allergen Reactions  . Aleve [Naproxen Sodium] Other (See Comments)    Due to kidneys.  . Contrast Media [Iodinated Diagnostic Agents] Itching, Swelling and Other (See Comments)    REACTION: Redness Per patient 10/27/17 "Cannot use any type of dye."  . Fentanyl Other (See Comments)    Facial swelling   . Motrin [Ibuprofen] Other (See Comments)    Due to kidneys.  . Nsaids Other (See Comments)    KIDNEY FAILURE AND ONLY HAS PARTIAL LEFT KIDNEY REMAINING (RIGHT ONE TAKEN OUT)  . Tape Other (See Comments)    Please use "paper" tape   Review of Systems  Unable to perform ROS: Mental status change    Physical Exam  Constitutional: She is sleeping. No distress.  HENT:  Head: Normocephalic and atraumatic.  Pulmonary/Chest: No accessory muscle usage. No respiratory distress.  Abdominal: She  exhibits distension.  Skin: Skin is warm and dry.    Vital Signs: BP 98/60 (BP Location: Right Arm)   Pulse 65   Temp 97.7 F (36.5 C) (Oral)   Resp 18   Wt 67.9 kg (149 lb 11.1 oz)   SpO2 99%   BMI 26.52 kg/m  Pain Scale: 0-10   Pain Score: 6    SpO2: SpO2: 99 % O2 Device:SpO2: 99 % O2 Flow Rate: .   IO: Intake/output summary:   Intake/Output Summary (Last 24 hours) at 03/16/2018 1300 Last data filed at 03/16/2018 0841 Gross per 24 hour  Intake 120 ml  Output 0 ml  Net 120 ml    LBM:   Baseline Weight: Weight: 67.9 kg (149 lb 11.1 oz) Most recent weight: Weight: 67.9 kg (149 lb 11.1 oz)     Palliative Assessment/Data: PPS 40%     Time In: 1200 Time Out: 1300 Time Total: 60 minutes Greater than 50%  of this time was spent counseling and coordinating care related to the above assessment and plan.  Juel Burrow, DNP, AGNP-C Palliative Medicine Team [phone]

## 2018-03-16 NOTE — Progress Notes (Signed)
HEMATOLOGY/ONCOLOGY CONSULTATION NOTE  Date of Service: 03/16/2018  Patient Care Team: Smiley Houseman, MD as PCP - General  CHIEF COMPLAINTS/PURPOSE OF CONSULTATION:  Pancreatic mass with liver and lung mets and ascites  HISTORY OF PRESENTING ILLNESS:   [name] [name] is a  67 y.o. female who has been referred to [place] by Dr Sherene Sires DO for evaluation and management of newly noted likely widely metastatic pancreatic cancer.  She is accompanied today by her sister whom we are communicating with as the pt is falling asleep from pain medications.   Patient has been living at supervised independent living facility cared for by her sister.   She has multiple medical co-morbidities including ESRD on HD, HTN, DM2, CAD, ischemic Cardiomyopathy,anemia, memory loss among others.  The pt's sister notes that in the last 1-2 month patients health has gone down hill rapidly with significant back and abdominal pain. Lost 20 pounds after not being able to eat much. The pt has been largely independent up until recently when her sister notes that she began forgetting her dialysis visits, among other details of her daily life, and had more trouble walking with her walker. The pt's sister notes that she had been bathing and dressing herself. Yesterday the patient's sister called EMS after significant concerns for her status, and notes that the pt does not recall any of the events of yesterday. The pt will be receiving her dialysis treatment tonight.   The patient's sister notes that the pt has had a very difficult time with pain recently. The patient's sister notes that she and their brother think that comfort measures would be in the patient's best interest.   Of note prior to the patient's visit today, pt has had CT C/A/P completed on 03/15/18 with results revealing 1. Mass centered in body of pancreas measuring 6.3 cm. 2. Multiple lucent foci throughout the liver, lymphadenopathy posterior to  pancreas, and new pulmonary nodules throughout the lungs, probable metastatic disease. 3. Stable round atelectasis in left lower lobe. 4. Moderate to large volume peritoneal ascites.  Anasarca. 5. Sigmoid diverticulosis without findings of acute diverticulitis. 6. Severe aortic atherosclerosis.   Most recent lab results (03/16/18) of CBC  is as follows: all values are WNL except for RBC at 3.48, Hgb at 10.6, HCT at 30.5, RDW at 17.9.  Review of systems could not adequately be collected as pt was sleepy but appears to have discomfort over abd and some abd distention.  On PMHx: HFpEF, ESRD, GI bleed, CAD, HTN, HLD, chronic cough.  On Family Hx the pt's sister reports maternal side uterine cancer, and paternal side colon cancer, and esophogeal cancer.    MEDICAL HISTORY:  Past Medical History:  Diagnosis Date  . Anemia   . Coronary artery disease    a. 6/16 Cath: Severe multivessel dzs->not surg candidate 2/2 debility/poor venous conduits->Med Rx; b. 01/2016 Cath/PCI: LM 50ost, LAD 67m (2.5x32 & 2.5x20 Promus Prem DES'), D1 70ost, D2 90, LCX 95p/m (3.0x16 Promus Prem DES), 65d, RCA 60p->d, RPDA 95 (2.5x12 Promus Prem DES).  . Diabetes mellitus ([facility])   . ESRD (end stage renal disease) on dialysis ([place])       . Hearing difficulty    Mild   . Hyperlipidemia   . Hypertension   . Hyponatremia   . Ischemic cardiomyopathy    a. Echo 04/2015: EF 35%. Cath same time - 45%.  . Memory loss   . Syncope   . Thrombocytopenia ([place])  SURGICAL HISTORY: Past Surgical History:  Procedure Laterality Date  . AV FISTULA PLACEMENT Left 03/07/2013   Procedure: ARTERIOVENOUS (AV) FISTULA CREATION;  Surgeon: Elam Dutch, MD;  Location: [facility];  Service: Vascular;  Laterality: Left;  left brachiocephalic AVF  . CARDIAC CATHETERIZATION N/A 04/25/2015   Procedure: Left Heart Cath and Coronary Angiography;  Surgeon: Belva Crome, MD;  Location: [facility];  Service: Cardiovascular;  Laterality: N/A;   . CARDIAC CATHETERIZATION N/A 02/03/2016   Procedure: Coronary Stent Intervention;  Surgeon: Belva Crome, MD;  Location: [facility];  Service: Cardiovascular;  Laterality: N/A;  . COLONOSCOPY WITH PROPOFOL N/A 12/23/2016   Procedure: COLONOSCOPY WITH PROPOFOL;  Surgeon: Milus Banister, MD;  Location: [facility];  Service: Endoscopy;  Laterality: N/A;  . CORONARY STENT PLACEMENT  02/03/2016   LAD X2     CIRCUMFLEX       RCA   . ESOPHAGOGASTRODUODENOSCOPY Left 02/20/2013   Procedure: ESOPHAGOGASTRODUODENOSCOPY (EGD);  Surgeon: Cleotis Nipper, MD;  Location: [facility];  Service: Endoscopy;  Laterality: Left;  . ESOPHAGOGASTRODUODENOSCOPY (EGD) WITH PROPOFOL N/A 12/23/2016   Procedure: ESOPHAGOGASTRODUODENOSCOPY (EGD) WITH PROPOFOL;  Surgeon: Milus Banister, MD;  Location: [facility];  Service: Endoscopy;  Laterality: N/A;  . 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 Marital status: Widowed    Spouse name: Not on file  . Number of children: Not on file  . Years of education: Not on file  . Highest education level: Not on file  Occupational History  . Not on file  Social Needs  . Financial resource strain: Not on file  . Food insecurity:    Worry: Not on file    Inability: Not on file  . Transportation needs:    Medical: Not on file    Non-medical: Not on file  Tobacco Use  . Smoking status: Never Smoker  . Smokeless tobacco: Never Used  Substance and Sexual Activity  . Alcohol use: No  . Drug use: No  . Sexual activity: Not Currently    Birth control/protection: Abstinence  Lifestyle  . Physical activity:    Days per week: Not on file    Minutes per session: Not on file  . Stress: Not on file  Relationships  . Social connections:    Talks on phone: Not on file    Gets together: Not on file    Attends religious service: Not on file    Active member of club or organization:  Not on file    Attends meetings of clubs or organizations: Not on file    Relationship status: Not on file  . Intimate partner violence:    Fear of current or ex partner: Not on file    Emotionally abused: Not on file    Physically abused: Not on file    Forced sexual activity: Not on file  Other Topics Concern  . Not on file  Social History Narrative      Denies alcohol, tobacco (never smoked), other drugs   Hobbies: reading       FAMILY HISTORY: Family History  Problem Relation Age of Onset  . Bone cancer Father        Of jaws   . Throat cancer Father   . Heart attack Father   . Heart disease Unknown   . Diabetes Unknown   . Stroke Unknown   .  Heart attack Sister   . Stroke Sister   . Stroke Brother     ALLERGIES:  is allergic to aleve [naproxen sodium]; contrast media [iodinated diagnostic agents]; fentanyl; motrin [ibuprofen]; nsaids; and tape.  MEDICATIONS:  Current Facility-Administered Medications  Medication Dose Route Frequency Provider Last Rate Last Dose  . acetaminophen (TYLENOL) tablet 500 mg  500 mg Oral Q6H PRN Kathrene Alu, MD      . atorvastatin (LIPITOR) tablet 40 mg  40 mg Oral q morning - 10a Winfrey, Alcario Drought, MD      . Chlorhexidine Gluconate Cloth 2 % PADS 6 each  6 each Topical Q0600 Dickie La, MD   6 each at 03/16/18 [phone]  . fluticasone (FLONASE) 50 MCG/ACT nasal spray 1 spray  1 spray Each Nare BID Maia Breslow C, MD      . HYDROmorphone (DILAUDID) injection 0.5 mg  0.5 mg Intravenous Q3H PRN Dickie La, MD      . lidocaine (LIDODERM) 5 % 1 patch  1 patch Transdermal Q24H Kathrene Alu, MD   1 patch at 03/15/18 2219  . loratadine (CLARITIN) tablet 10 mg  10 mg Oral Daily Kathrene Alu, MD   10 mg at 03/15/18 2218  . midodrine (PROAMATINE) tablet 10 mg  10 mg Oral Daily Winfrey, Amanda C, MD      . multivitamin (RENA-VIT) tablet 1 tablet  1 tablet Oral QHS Kathrene Alu, MD   1 tablet at 03/15/18 2218  . mupirocin  ointment (BACTROBAN) 2 % 1 application  1 application Nasal BID Dickie La, MD      . pantoprazole (PROTONIX) EC tablet 40 mg  40 mg Oral BID Kathrene Alu, MD   40 mg at 03/15/18 2218  . sevelamer carbonate (RENVELA) tablet 1,600 mg  1,600 mg Oral TID WC Dickie La, MD        REVIEW OF SYSTEMS:    10 Point review of Systems was done is negative except as noted above.  PHYSICAL EXAMINATION: ECOG PERFORMANCE STATUS: 3 - Symptomatic, >50% confined to bed  . Vitals:   03/16/18 0526 03/16/18 0801  BP: (!) 111/48 98/60  Pulse: (!) 58 65  Resp: 16 18  Temp: 97.8 F (36.6 C) 97.7 F (36.5 C)  SpO2: 99% 99%   Filed Weights   03/15/18 2127 03/16/18 0500  Weight: 149 lb 11.1 oz (67.9 kg) 149 lb 11.1 oz (67.9 kg)   .Body mass index is 26.52 kg/m.  GENERAL:alert, in no acute distress and comfortable SKIN: no acute rashes, no significant lesions EYES: conjunctiva are pink and non-injected, sclera anicteric OROPHARYNX: MMM, no exudates, no oropharyngeal erythema or ulceration NECK: supple, no JVD LYMPH:  no palpable lymphadenopathy in the cervical, axillary or inguinal regions LUNGS: clear to auscultation b/l with normal respiratory effort HEART: regular rate & rhythm ABDOMEN:  normoactive bowel sounds , non tender, not distended. Extremity: no pedal edema PSYCH: alert & oriented x 3 with fluent speech NEURO: no focal motor/sensory deficits  LABORATORY DATA:  I have reviewed the data as listed  . CBC Latest Ref Rng & Units 03/16/2018 03/15/2018 01/11/2017  WBC 4.0 - 10.5 K/uL 5.3 6.0 -  Hemoglobin 12.0 - 15.0 g/dL 10.6(L) 12.8 8.2(A)  Hematocrit 36.0 - 46.0 % 30.5(L) 37.3 -  Platelets 150 - 400 K/uL 154 183 -    . CMP Latest Ref Rng & Units 03/16/2018 03/15/2018 02/07/2018  Glucose 65 - 99 mg/dL 75 90 153(H)  BUN 6 - 20 mg/dL 44(H) 38(H) 24  Creatinine 0.44 - 1.00 mg/dL 6.19(H) 5.61(H) 4.86(H)  Sodium 135 - 145 mmol/L 135 133(L) 140  Potassium 3.5 - 5.1 mmol/L 3.5 3.9  3.3(L)  Chloride 101 - 111 mmol/L 98(L) 95(L) 97  CO2 22 - 32 mmol/L 24 22 25   Calcium 8.9 - 10.3 mg/dL 8.4(L) 8.6(L) 9.0  Total Protein 6.5 - 8.1 g/dL 4.6(L) 5.0(L) 6.1  Total Bilirubin 0.3 - 1.2 mg/dL 1.0 1.6(H) 0.5  Alkaline Phos 38 - 126 U/L 418(H) 476(H) 181(H)  AST 15 - 41 U/L 49(H) 52(H) 26  ALT 14 - 54 U/L 22 24 15      RADIOGRAPHIC STUDIES: I have personally reviewed the radiological images as listed and agreed with the findings in the report. Ct Abdomen Pelvis Wo Contrast  Result Date: 03/15/2018 CLINICAL DATA:  67 y/o  F; abdominal pain for 2 months. EXAM: CT ABDOMEN AND PELVIS WITHOUT CONTRAST TECHNIQUE: Multidetector CT imaging of the abdomen and pelvis was performed following the standard protocol without IV contrast. COMPARISON:  03/17/2017 PET-CT. 02/28/2018 lumbar spine MRI. 02/15/2013 CT abdomen and pelvis. FINDINGS: Lower chest: Round atelectasis in the left lower lobe. Interval development of numerous pulmonary nodules throughout the lungs measuring up to 8 mm in the right lower lobe superior segment (series 3, image 10). Hepatobiliary: Interval development of multiple ill-defined lucent foci throughout the liver best appreciated at the dome (series 3, image 26). No biliary ductal dilatation. Pancreas: Mass centered within the body of pancreas measuring 6.3 x 3.9 x 3.1 cm (AP x ML x CC series 3, image 46 and series 7, image 105). Directly posteriorly there is a additional mass which appears discrete measuring 3.7 x 22 mm, possibly lymphadenopathy (series 3, image 45). Spleen: Normal in size without focal abnormality. Adrenals/Urinary Tract: Right nephrectomy. Atrophic left kidney. No hydronephrosis. Collapsed bladder. Stomach/Bowel: Stomach is within normal limits. Appendix appears normal. No evidence of bowel wall thickening, distention, or inflammatory changes. Sigmoid diverticulosis without findings of acute diverticulitis. Vascular/Lymphatic: Severe calcific atherosclerosis of  the abdominal aorta. No adenopathy. Reproductive: Stable 10 mm calcified uterine myoma. Otherwise negative. Other: Small left inguinal hernia containing ascitic fluid. Moderate to large volume of peritoneal ascites. Diffuse subcutaneous edema compatible with anasarca. Musculoskeletal: No acute osseous abnormality is identified. No lytic or blastic lesion is evident. Multilevel spondylosis. IMPRESSION: 1. Mass centered in body of pancreas measuring 6.3 cm. 2. Multiple lucent foci throughout the liver, lymphadenopathy posterior to pancreas, and new pulmonary nodules throughout the lungs, probable metastatic disease. 3. Stable round atelectasis in left lower lobe. 4. Moderate to large volume peritoneal ascites.  Anasarca. 5. Sigmoid diverticulosis without findings of acute diverticulitis. 6. Severe aortic atherosclerosis. Electronically Signed   By: Kristine Garbe M.D.   On: 03/15/2018 18:08   Dg Chest 2 View  Result Date: 03/15/2018 CLINICAL DATA:  Weakness. EXAM: CHEST - 2 VIEW COMPARISON:  Chest x-ray dated February 19, 2017. FINDINGS: Stable mild cardiomegaly. Mild diffuse interstitial thickening. Small left pleural effusion. No consolidation or pneumothorax. No acute osseous abnormality. IMPRESSION: Mild interstitial pulmonary edema and small left pleural effusion. Electronically Signed   By: Titus Dubin M.D.   On: 03/15/2018 15:53   Ct Chest Wo Contrast  Result Date: 03/15/2018 CLINICAL DATA:  Pancreatic mass on CT abdomen/pelvis, adenocarcinoma suspected EXAM: CT CHEST WITHOUT CONTRAST TECHNIQUE: Multidetector CT imaging of the chest was performed following the standard protocol without IV contrast. COMPARISON:  Partial comparison to CT abdomen/pelvis dated 03/15/2018. CT  chest dated 07/05/2017. FINDINGS: Cardiovascular: Mild cardiomegaly.  No pericardial effusion. No evidence of thoracic aortic aneurysm. Atherosclerotic calcifications of the aortic arch. Three vessel coronary atherosclerosis.  Mediastinum/Nodes: No suspicious mediastinal lymphadenopathy. Visualized thyroid is unremarkable. Lungs/Pleura: Evaluation lung parenchyma is constrained by respiratory motion. Multiple bilateral pulmonary nodules, suspicious for metastases. Representative nodules include: --8 mm nodule in the right upper lobe (series 8/image 17) --8 mm nodule in the right upper lobe (series 8/image 24) --6 mm nodule in the superior segment left lower lobe (series 8/image 41) --8 mm nodule in right lower lobe (series 8/image 43) --9 mm nodule in the right lower lobe (series 8/image 46) Rounded atelectasis in the left lower lobe, chronic. No focal consolidation. No pleural effusion or pneumothorax. Upper Abdomen: Visualized upper abdomen is better evaluated on recent dedicated CT abdomen/pelvis, noting an incompletely visualized pancreatic mass (series 4/image 139). Musculoskeletal: Visualized osseous structures are within normal limits. IMPRESSION: Multiple bilateral pulmonary nodules, measuring up to 9 mm in the right lower lobe, suspicious for metastases. Pancreatic mass is incompletely visualized, better evaluated on recent CT abdomen/pelvis. Aortic Atherosclerosis (ICD10-I70.0). Electronically Signed   By: Julian Hy M.D.   On: 03/15/2018 21:38   Mr Lumbar Spine Wo Contrast  Result Date: 02/28/2018 CLINICAL DATA:  Low back pain for 2 months.  No known injury. EXAM: MRI LUMBAR SPINE WITHOUT CONTRAST TECHNIQUE: Multiplanar, multisequence MR imaging of the lumbar spine was performed. No intravenous contrast was administered. COMPARISON:  Plain films lumbar spine 02/09/2018. PET CT scan 03/17/2017. FINDINGS: Segmentation:  Standard. Alignment: Convex right scoliosis with the apex at L2-3 is seen. There is also 0.3 cm retrolisthesis L2 on L3 and L3 on L4. Vertebrae: No fracture or worrisome lesion. Scattered mild degenerative endplate signal change is most notable at L2-3. Conus medullaris and cauda equina: Conus extends  to the L1 level. Conus and cauda equina appear normal. Paraspinal and other soft tissues: Several tiny stones are seen layering dependently in the gallbladder. Moderate volume of ascites is seen. Disc levels: T11-12 is imaged in the sagittal plane only and negative. T12-L1: Minimal disc bulge and mild facet degenerative change. The central canal and foramina are widely patent. L1-2: There is a shallow right paracentral protrusion but the central canal and foramina are open. L2-3: Minimal disc bulge without central canal or foraminal stenosis. L3-4: Shallow disc bulge without central canal or foraminal stenosis. L4-5: Minimal disc bulge, mild facet degenerative change and ligamentum flavum thickening. The central canal and foramina are open. L5-S1: Advanced facet degenerative change and a shallow disc bulge are identified. Disc and facet arthropathy cause moderate right foraminal narrowing. The central canal and left foramen are open. IMPRESSION: Mild multilevel degenerative disease without central canal stenosis. Facet arthropathy and disc bulging cause moderate right foraminal narrowing at L5-S1. Convex right scoliosis. Small gallstones without evidence of cholecystitis. Partial visualization of a moderate volume of abdominal ascites, etiology unknown. Note is made that the patient had a small volume of ascites on the 2018 PET CT scan. Electronically Signed   By: Inge Rise M.D.   On: 02/28/2018 13:43    ASSESSMENT & PLAN:  67 y.o. female with multiple medical co-morbidities as noted above including memory issues with   1. Likely Newly diagnosed Metastatic Pancreatic Carcinoma  Presenting with widely metastatic disease in the liver, lung and likely malignant ascites. Has a large pancreatic mass causing significant discomfort as well.  2. Poor Functional Status ECOG PS 3-4  3. ESRD on HD, CAD ,HTN,  DM2  4. Cancer related anorexia with severe protein calorie malnutrition  5. Memory  issues.  PLAN  -Reviewed the patient labs and discussed CT C/A/P with pt's sister at bedside which revealed a central pancreatic mass 6.3 x 3.9 x 3.1 cm. And findings of likely widely metastatic pancreatic cancer. -CA 19-9 levels pending. -extensively discussed goals of care with sister at bedside and that this clinically is consistent with metastatic pancreatic cancer and is an incurable condition. -given patients memory issues, performance and significant burden of medical co-morbidities she would be a poor candidate for palliative systemic chemotherapy. -therapeutic and diagnostic paracentesis might provide comfort and tissue diagnosis. -given clear clinical picture and lack of candidacy for palliative rx would not push for more invasive workup at this time. -palliative care input. -comfort cares/hospice based approach would likely be in the patients best interest. -Recommend that in light of other medical problems, focusing on comfort cares would be recommended. -she would also likely be unable to tolerate HD as her condition is rapidly declining and given her significant symptom burden. -I shall be available for any other questions/concerns as they arise.    All of the patients questions were answered with apparent satisfaction. The patient knows to call the clinic with any problems, questions or concerns.  I spent 60 minutes counseling the patient face to face. The total time spent in the appointment was 80 minutes and more than 50% was on counseling and direct patient cares.    Sullivan Lone MD [place] AAHIVMS [facility] Hematology/Oncology Physician [facility]  (Office):       [phone] (Work cell):  [phone] (Fax):           [phone]  03/16/2018 9:47 AM  This document serves as a record of services personally performed by Sullivan Lone, MD. It was created on his behalf by Baldwin [place], a trained medical scribe. The creation of this record is based on the scribe's  personal observations and the provider's statements to them.   .I have reviewed the above documentation for accuracy and completeness, and I agree with the above. Sullivan Lone MD MS

## 2018-03-16 NOTE — Consult Note (Addendum)
[facility] Renal Consultation Note    Indication for Consultation:  Management of ESRD/hemodialysis; anemia, hypertension/volume and secondary hyperparathyroidism  HPI: [name] [name] is a 67 y.o. female with ESRD on HD (TTS [facility]), DM, CAD s/p chronic combined CHF EF 45-50%,  severe multi-vessel CAD s/p PCI, restrictive lung disease, recurrent R pleural effusions, Hx GIB, chronic hypotension on midodrine.   She presented to ED with abdominal and back pain and was found to have 6.3cm pancreatic mass on abd CT with multiple lung nodules suggesting metastasis. Also with large volume peritoneal ascites and anasarca. CXR showed mild pulmonary edema. Labs: K 3.5, WBC 5.3, Hgb 10.6. Had +FOBT in ED.   Seen in room with family present. Alert, but disoriented to surroundings. Biggest c/o is back pain. She has been having pain while sitting during her dialysis sessions. Denies CP, SOB, N,V,D. Doesn't have her hearing aids today  Last dialyzed Saturday. She missed dialysis Tuesday because family felt she was too "out of it" and took her to the ED. She has been compliant with treatments and reaching or getting below her dry weight.   Past Medical History:  Diagnosis Date  . Anemia   . Coronary artery disease    a. 6/16 Cath: Severe multivessel dzs->not surg candidate 2/2 debility/poor venous conduits->Med Rx; b. 01/2016 Cath/PCI: LM 50ost, LAD 54m (2.5x32 & 2.5x20 Promus Prem DES'), D1 70ost, D2 90, LCX 95p/m (3.0x16 Promus Prem DES), 65d, RCA 60p->d, RPDA 95 (2.5x12 Promus Prem DES).  . Diabetes mellitus ([place])   . ESRD (end stage renal disease) on dialysis ([place])       . Hearing difficulty    Mild   . Hyperlipidemia   . Hypertension   . Hyponatremia   . Ischemic cardiomyopathy    a. Echo 04/2015: EF 35%. Cath same time - 45%.  . Memory loss   . Syncope   . Thrombocytopenia ([place])    Past Surgical History:  Procedure Laterality Date  . AV FISTULA PLACEMENT Left 03/07/2013    Procedure: ARTERIOVENOUS (AV) FISTULA CREATION;  Surgeon: Elam Dutch, MD;  Location: [place];  Service: Vascular;  Laterality: Left;  left brachiocephalic AVF  . CARDIAC CATHETERIZATION N/A 04/25/2015   Procedure: Left Heart Cath and Coronary Angiography;  Surgeon: Belva Crome, MD;  Location: [place] [facility];  Service: Cardiovascular;  Laterality: N/A;  . CARDIAC CATHETERIZATION N/A 02/03/2016   Procedure: Coronary Stent Intervention;  Surgeon: Belva Crome, MD;  Location: [place] [facility];  Service: Cardiovascular;  Laterality: N/A;  . COLONOSCOPY WITH PROPOFOL N/A 12/23/2016   Procedure: COLONOSCOPY WITH PROPOFOL;  Surgeon: Milus Banister, MD;  Location: [place];  Service: Endoscopy;  Laterality: N/A;  . CORONARY STENT PLACEMENT  02/03/2016   LAD X2     CIRCUMFLEX       RCA   . ESOPHAGOGASTRODUODENOSCOPY Left 02/20/2013   Procedure: ESOPHAGOGASTRODUODENOSCOPY (EGD);  Surgeon: Cleotis Nipper, MD;  Location: [facility];  Service: Endoscopy;  Laterality: Left;  . ESOPHAGOGASTRODUODENOSCOPY (EGD) WITH PROPOFOL N/A 12/23/2016   Procedure: ESOPHAGOGASTRODUODENOSCOPY (EGD) WITH PROPOFOL;  Surgeon: Milus Banister, MD;  Location: [place];  Service: Endoscopy;  Laterality: N/A;  . 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 Bone cancer Father        Of jaws   . Throat cancer Father   . Heart attack Father   . Heart disease  Unknown   . Diabetes Unknown   . Stroke Unknown   . Heart attack Sister   . Stroke Sister   . Stroke Brother    Social History:  reports that she has never smoked. She has never used smokeless tobacco. She reports that she does not drink alcohol or use drugs. Allergies  Allergen Reactions  . Aleve [Naproxen Sodium] Other (See Comments)    Due to kidneys.  . Contrast Media [Iodinated Diagnostic Agents] Itching, Swelling and Other (See Comments)    REACTION: Redness Per patient  10/27/17 "Cannot use any type of dye."  . Fentanyl Other (See Comments)    Facial swelling   . Motrin [Ibuprofen] Other (See Comments)    Due to kidneys.  . Nsaids Other (See Comments)    KIDNEY FAILURE AND ONLY HAS PARTIAL LEFT KIDNEY REMAINING (RIGHT ONE TAKEN OUT)  . Tape Other (See Comments)    Please use "paper" tape   Prior to Admission medications   Medication Sig Start Date End Date Taking? Authorizing Provider  acetaminophen (TYLENOL) 500 MG tablet Take 500 mg by mouth every 6 (six) hours as needed for headache.   Yes [provider]  aspirin 81 MG EC tablet Take 1 tablet (81 mg total) by mouth daily. 12/26/16  Yes [place] Bing, DO  atorvastatin (LIPITOR) 40 MG tablet Take 1 tablet (40 mg total) by mouth every morning. 08/16/17  Yes Smiley Houseman, MD  clopidogrel (PLAVIX) 75 MG tablet Take 1 tablet (75 mg total) by mouth daily. 12/01/17  Yes Belva Crome, MD  fluticasone [facility]) 50 MCG/ACT nasal spray Place 1 spray into both nostrils 2 (two) times daily.   Yes [provider]  lidocaine (LIDODERM) 5 % Place 1 patch onto the skin daily. Remove & Discard patch within 12 hours 02/18/18  Yes Smiley Houseman, MD  loratadine (CLARITIN) 10 MG tablet Take 1 tablet (10 mg total) by mouth daily. 01/10/18  Yes Collene Gobble, MD  midodrine (PROAMATINE) 10 MG tablet Take one (1) tablet (10 mg total) by mouth at bedtime on Mondays, Wednesdays, and Fridays. Take one (1) tablet (10 mg total) by mouth pre-dialysis on Tuesdays, Thursdays, and Saturdays. 11/18/15  Yes [provider]  multivitamin (RENA-VIT) TABS tablet Take 1 tablet by mouth at bedtime. 09/04/14  Yes Leone Haven, MD  nitroGLYCERIN (NITROSTAT) 0.4 MG SL tablet Place 1 tablet (0.4 mg total) under the tongue every 5 (five) minutes as needed for chest pain. 12/01/17  Yes Belva Crome, MD  pantoprazole (PROTONIX) 40 MG tablet Take 1 tablet (40 mg total) by mouth 2 (two) times daily. 01/10/18   Yes Collene Gobble, MD  sevelamer carbonate (RENVELA) 800 MG tablet Take 1,600 mg by mouth See admin instructions. Take 2 tablets by mouth three times daily with meals and take 1 tablet with snacks 01/11/18  Yes [provider]  traMADol (ULTRAM) 50 MG tablet Take 1 tablet (50 mg total) by mouth daily as needed. 03/02/18  Yes Smiley Houseman, MD  oxyCODONE-acetaminophen (PERCOCET/ROXICET) 5-325 MG tablet Take 1 tablet by mouth every 6 (six) hours as needed for severe pain. 03/15/18   Virgel Manifold, MD   Current Facility-Administered Medications  Medication Dose Route Frequency Provider Last Rate Last Dose  . acetaminophen (TYLENOL) tablet 500 mg  500 mg Oral Q6H PRN Kathrene Alu, MD      . atorvastatin (LIPITOR) tablet 40 mg  40 mg Oral q morning - 10a  Kathrene Alu, MD   40 mg at 03/16/18 0953  . Chlorhexidine Gluconate Cloth 2 % PADS 6 each  6 each Topical Q0600 Dickie La, MD   6 each at 03/16/18 [phone]  . fluticasone (FLONASE) 50 MCG/ACT nasal spray 1 spray  1 spray Each Nare BID Maia Breslow C, MD      . HYDROmorphone (DILAUDID) injection 0.5 mg  0.5 mg Intravenous Q3H PRN Dickie La, MD   0.5 mg at 03/16/18 1146  . lidocaine (LIDODERM) 5 % 1 patch  1 patch Transdermal Q24H Kathrene Alu, MD   1 patch at 03/15/18 2219  . loratadine (CLARITIN) tablet 10 mg  10 mg Oral Daily Kathrene Alu, MD   10 mg at 03/16/18 0953  . midodrine (PROAMATINE) tablet 10 mg  10 mg Oral Daily Kathrene Alu, MD   10 mg at 03/16/18 0953  . multivitamin (RENA-VIT) tablet 1 tablet  1 tablet Oral QHS Kathrene Alu, MD   1 tablet at 03/15/18 2218  . mupirocin ointment (BACTROBAN) 2 % 1 application  1 application Nasal BID Dickie La, MD   1 application at 38/18/29 [phone]  . pantoprazole (PROTONIX) EC tablet 40 mg  40 mg Oral BID Kathrene Alu, MD   40 mg at 03/16/18 0953  . sevelamer carbonate (RENVELA) tablet 1,600 mg  1,600 mg Oral TID WC Dickie La, MD   1,600 mg at  03/16/18 0953    ROS: As per HPI otherwise negative.  Physical Exam: Vitals:   03/15/18 2127 03/16/18 0500 03/16/18 0526 03/16/18 0801  BP: (!) 101/43  (!) 111/48 98/60  Pulse: 60  (!) 58 65  Resp: 16  16 18   Temp: 97.8 F (36.6 C)  97.8 F (36.6 C) 97.7 F (36.5 C)  TempSrc: Oral  Oral Oral  SpO2: 99%  99% 99%  Weight: 67.9 kg (149 lb 11.1 oz) 67.9 kg (149 lb 11.1 oz)       General: WDWN female NAD  Head: NCAT sclera not icteric  Neck: Supple. No JVD No masses Lungs: CTA bilaterally without wheezes, rales, or rhonchi. Breathing is unlabored. Heart: RRR  2/6 SEM  Abdomen: soft NT + BS Lower extremities: Trace LE edema, No open wounds  Neuro: A & O  X 2. Moves all extremities spontaneously. Psych:  Responds to questions appropriately with a normal affect. Dialysis Access: LUE AVF +bruit   Labs: Basic Metabolic Panel: Recent Labs  Lab 03/15/18 1352 03/16/18 0406  NA 133* 135  K 3.9 3.5  CL 95* 98*  CO2 22 24  GLUCOSE 90 75  BUN 38* 44*  CREATININE 5.61* 6.19*  CALCIUM 8.6* 8.4*   Liver Function Tests: Recent Labs  Lab 03/15/18 1352 03/16/18 0406  AST 52* 49*  ALT 24 22  ALKPHOS 476* 418*  BILITOT 1.6* 1.0  PROT 5.0* 4.6*  ALBUMIN 2.4* 2.2*   Recent Labs  Lab 03/15/18 1352  LIPASE 16   Recent Labs  Lab 03/15/18 2130  AMMONIA 17   CBC: Recent Labs  Lab 03/15/18 1136 03/16/18 0406  WBC 6.0 5.3  NEUTROABS 4.6  --   HGB 12.8 10.6*  HCT 37.3 30.5*  MCV 88.8 87.6  PLT 183 154   Cardiac Enzymes: No results for input(s): CKTOTAL, CKMB, CKMBINDEX, TROPONINI in the last 168 hours. CBG: Recent Labs  Lab 03/16/18 0648  GLUCAP 77   Iron Studies: No results for input(s): IRON, TIBC, TRANSFERRIN, FERRITIN in  the last 72 hours. Studies/Results: Ct Abdomen Pelvis Wo Contrast  Result Date: 03/15/2018 CLINICAL DATA:  67 y/o  F; abdominal pain for 2 months. EXAM: CT ABDOMEN AND PELVIS WITHOUT CONTRAST TECHNIQUE: Multidetector CT imaging of the  abdomen and pelvis was performed following the standard protocol without IV contrast. COMPARISON:  03/17/2017 PET-CT. 02/28/2018 lumbar spine MRI. 02/15/2013 CT abdomen and pelvis. FINDINGS: Lower chest: Round atelectasis in the left lower lobe. Interval development of numerous pulmonary nodules throughout the lungs measuring up to 8 mm in the right lower lobe superior segment (series 3, image 10). Hepatobiliary: Interval development of multiple ill-defined lucent foci throughout the liver best appreciated at the dome (series 3, image 26). No biliary ductal dilatation. Pancreas: Mass centered within the body of pancreas measuring 6.3 x 3.9 x 3.1 cm (AP x ML x CC series 3, image 46 and series 7, image 105). Directly posteriorly there is a additional mass which appears discrete measuring 3.7 x 22 mm, possibly lymphadenopathy (series 3, image 45). Spleen: Normal in size without focal abnormality. Adrenals/Urinary Tract: Right nephrectomy. Atrophic left kidney. No hydronephrosis. Collapsed bladder. Stomach/Bowel: Stomach is within normal limits. Appendix appears normal. No evidence of bowel wall thickening, distention, or inflammatory changes. Sigmoid diverticulosis without findings of acute diverticulitis. Vascular/Lymphatic: Severe calcific atherosclerosis of the abdominal aorta. No adenopathy. Reproductive: Stable 10 mm calcified uterine myoma. Otherwise negative. Other: Small left inguinal hernia containing ascitic fluid. Moderate to large volume of peritoneal ascites. Diffuse subcutaneous edema compatible with anasarca. Musculoskeletal: No acute osseous abnormality is identified. No lytic or blastic lesion is evident. Multilevel spondylosis. IMPRESSION: 1. Mass centered in body of pancreas measuring 6.3 cm. 2. Multiple lucent foci throughout the liver, lymphadenopathy posterior to pancreas, and new pulmonary nodules throughout the lungs, probable metastatic disease. 3. Stable round atelectasis in left lower lobe.  4. Moderate to large volume peritoneal ascites.  Anasarca. 5. Sigmoid diverticulosis without findings of acute diverticulitis. 6. Severe aortic atherosclerosis. Electronically Signed   By: Kristine Garbe M.D.   On: 03/15/2018 18:08   Dg Chest 2 View  Result Date: 03/15/2018 CLINICAL DATA:  Weakness. EXAM: CHEST - 2 VIEW COMPARISON:  Chest x-ray dated February 19, 2017. FINDINGS: Stable mild cardiomegaly. Mild diffuse interstitial thickening. Small left pleural effusion. No consolidation or pneumothorax. No acute osseous abnormality. IMPRESSION: Mild interstitial pulmonary edema and small left pleural effusion. Electronically Signed   By: Titus Dubin M.D.   On: 03/15/2018 15:53   Ct Chest Wo Contrast  Result Date: 03/15/2018 CLINICAL DATA:  Pancreatic mass on CT abdomen/pelvis, adenocarcinoma suspected EXAM: CT CHEST WITHOUT CONTRAST TECHNIQUE: Multidetector CT imaging of the chest was performed following the standard protocol without IV contrast. COMPARISON:  Partial comparison to CT abdomen/pelvis dated 03/15/2018. CT chest dated 07/05/2017. FINDINGS: Cardiovascular: Mild cardiomegaly.  No pericardial effusion. No evidence of thoracic aortic aneurysm. Atherosclerotic calcifications of the aortic arch. Three vessel coronary atherosclerosis. Mediastinum/Nodes: No suspicious mediastinal lymphadenopathy. Visualized thyroid is unremarkable. Lungs/Pleura: Evaluation lung parenchyma is constrained by respiratory motion. Multiple bilateral pulmonary nodules, suspicious for metastases. Representative nodules include: --8 mm nodule in the right upper lobe (series 8/image 17) --8 mm nodule in the right upper lobe (series 8/image 24) --6 mm nodule in the superior segment left lower lobe (series 8/image 41) --8 mm nodule in right lower lobe (series 8/image 43) --9 mm nodule in the right lower lobe (series 8/image 46) Rounded atelectasis in the left lower lobe, chronic. No focal consolidation. No pleural  effusion or pneumothorax.  Upper Abdomen: Visualized upper abdomen is better evaluated on recent dedicated CT abdomen/pelvis, noting an incompletely visualized pancreatic mass (series 4/image 139). Musculoskeletal: Visualized osseous structures are within normal limits. IMPRESSION: Multiple bilateral pulmonary nodules, measuring up to 9 mm in the right lower lobe, suspicious for metastases. Pancreatic mass is incompletely visualized, better evaluated on recent CT abdomen/pelvis. Aortic Atherosclerosis (ICD10-I70.0). Electronically Signed   By: Julian Hy M.D.   On: 03/15/2018 21:38    Dialysis Orders:  [facility] TTS 4h 180NRe  BFR 425/A1.5x EDW 67.5kg 2K/2.25Ca Profile 4 -L AVF No Heparin (hx GIB) -Hectorol 1 mcg IV TIW   Assessment/Plan: 1. Pancreatic mass with suspected liver/lung mets/ascites on CT   - Further eval per primary team. Palliative care consulted for [place] 2. ESRD - TTS. HD today for missed treatment, then back on schedule.  3. Volume. Chronic hypotension on midodrine. Is at EDW by weights, but some volume on exam, will try challenge EDW down today.  4. Anemia  - Hgb 10.6  FOBT+. No ESA as outpatient. Follow trend. 5. Metabolic bone disease -  Ca ok. Continue Hectorol/Renvela binder 6. Nutrition - Low albumin liberalize diet, pro supp, vitamins  7. DM 8. CHF EF 45-50%  9. CAD,multi-vessel disease s/p PCI, stents   Lynnda Child PA-C [facility] Pager [phone] 03/16/2018, 12:05 PM   Pt seen, examined and agree w A/P as above.  Kelly Splinter MD [employer] pager [phone]   03/16/2018, 2:15 PM

## 2018-03-16 NOTE — Progress Notes (Signed)
FPTS Interim Progress Note  Met with patient's family at bedside.  Patient was sleeping after pain medication (she had consented earlier).  Shared current level of diagnosis as mass of pancrease with liver/lung abnormalities consistent with a likely metastatic cancer.  They are optimistic about palliative discussion tomorrow '@10am'$  with Ms. Elicia Lamp, NP.  They also hope to then have discussion with oncology about prognosis.  Sherene Sires, DO 03/16/2018, 3:43 PM PGY-1, [facility] pager [phone]

## 2018-03-16 NOTE — Progress Notes (Signed)
New Admission Note:  Arrival Method:  Via stretcher from ED  Mental Orientation: Alert & Oriented x2 (self and place) Telemetry: N/A Assessment: Completed Skin: Refer to flowsheet IV: Right Wrist & Forearm Pain: 0/10 Tubes: Safety Measures: Safety Fall Prevention Plan discussed with patient. Admission: Completed 5 [place] Orientation: Patient has been orientated to the room, unit and the staff.  Orders are being reviewed and implemented. Will continue to monitor the patient. Call light has been placed within reach and bed alarm has been activated.   Vassie Moselle, RN  Phone Number: [phone]

## 2018-03-17 ENCOUNTER — Ambulatory Visit: Payer: Medicare Other | Admitting: Nurse Practitioner

## 2018-03-17 ENCOUNTER — Encounter (HOSPITAL_COMMUNITY): Payer: Self-pay | Admitting: Radiology

## 2018-03-17 ENCOUNTER — Observation Stay (HOSPITAL_COMMUNITY): Payer: Medicare Other

## 2018-03-17 DIAGNOSIS — K869 Disease of pancreas, unspecified: Secondary | ICD-10-CM | POA: Diagnosis not present

## 2018-03-17 DIAGNOSIS — N186 End stage renal disease: Secondary | ICD-10-CM | POA: Diagnosis not present

## 2018-03-17 DIAGNOSIS — Z7189 Other specified counseling: Secondary | ICD-10-CM

## 2018-03-17 DIAGNOSIS — C259 Malignant neoplasm of pancreas, unspecified: Secondary | ICD-10-CM

## 2018-03-17 DIAGNOSIS — Z515 Encounter for palliative care: Secondary | ICD-10-CM | POA: Diagnosis not present

## 2018-03-17 DIAGNOSIS — C787 Secondary malignant neoplasm of liver and intrahepatic bile duct: Secondary | ICD-10-CM

## 2018-03-17 DIAGNOSIS — C7800 Secondary malignant neoplasm of unspecified lung: Secondary | ICD-10-CM

## 2018-03-17 DIAGNOSIS — R180 Malignant ascites: Secondary | ICD-10-CM

## 2018-03-17 DIAGNOSIS — Z992 Dependence on renal dialysis: Secondary | ICD-10-CM

## 2018-03-17 HISTORY — PX: IR PARACENTESIS: IMG2679

## 2018-03-17 LAB — RENAL FUNCTION PANEL
Albumin: 2.2 g/dL — ABNORMAL LOW (ref 3.5–5.0)
Anion gap: 13 (ref 5–15)
BUN: 50 mg/dL — ABNORMAL HIGH (ref 6–20)
CALCIUM: 8.4 mg/dL — AB (ref 8.9–10.3)
CO2: 25 mmol/L (ref 22–32)
CREATININE: 6.91 mg/dL — AB (ref 0.44–1.00)
Chloride: 94 mmol/L — ABNORMAL LOW (ref 101–111)
GFR calc Af Amer: 6 mL/min — ABNORMAL LOW (ref >60)
GFR calc non Af Amer: 5 mL/min — ABNORMAL LOW (ref >60)
GLUCOSE: 84 mg/dL (ref 65–99)
Phosphorus: 3.7 mg/dL (ref 2.5–4.6)
Potassium: 3.2 mmol/L — ABNORMAL LOW (ref 3.5–5.1)
SODIUM: 132 mmol/L — AB (ref 135–145)

## 2018-03-17 LAB — CBC
HCT: 35.2 % — ABNORMAL LOW (ref 36.0–46.0)
Hemoglobin: 12.1 g/dL (ref 12.0–15.0)
MCH: 30.2 pg (ref 26.0–34.0)
MCHC: 34.4 g/dL (ref 30.0–36.0)
MCV: 87.8 fL (ref 78.0–100.0)
PLATELETS: 203 10*3/uL (ref 150–400)
RBC: 4.01 MIL/uL (ref 3.87–5.11)
RDW: 18 % — ABNORMAL HIGH (ref 11.5–15.5)
WBC: 6.5 10*3/uL (ref 4.0–10.5)

## 2018-03-17 LAB — CANCER ANTIGEN 19-9: CA 19-9: 1 U/mL (ref 0–35)

## 2018-03-17 MED ORDER — ONDANSETRON HCL 4 MG/2ML IJ SOLN: 4.0000 mg | Freq: Four times a day (QID) | INTRAMUSCULAR | Status: DC | PRN

## 2018-03-17 MED ORDER — SENNA 8.6 MG PO TABS: 1.0000 | ORAL_TABLET | Freq: Every evening | ORAL | Status: DC | PRN

## 2018-03-17 MED ORDER — FLUTICASONE PROPIONATE 50 MCG/ACT NA SUSP
1.0000 | Freq: Two times a day (BID) | NASAL | Status: DC | PRN
  Filled 2018-03-17: qty 16

## 2018-03-17 MED ORDER — PENTAFLUOROPROP-TETRAFLUOROETH EX AERO: 1.0000 "application " | INHALATION_SPRAY | CUTANEOUS | Status: DC | PRN

## 2018-03-17 MED ORDER — HALOPERIDOL LACTATE 2 MG/ML PO CONC
0.5000 mg | ORAL | Status: DC | PRN
  Filled 2018-03-17: qty 0.3

## 2018-03-17 MED ORDER — LIDOCAINE HCL (PF) 1 % IJ SOLN: 5.0000 mL | INTRAMUSCULAR | Status: DC | PRN

## 2018-03-17 MED ORDER — OXYCODONE HCL 20 MG/ML PO CONC: 5.0000 mg | ORAL | Status: DC | PRN

## 2018-03-17 MED ORDER — HYDROMORPHONE HCL 1 MG/ML IJ SOLN
0.2500 mg | INTRAMUSCULAR | Status: DC | PRN
  Administered 2018-03-18: 0.25 mg via INTRAVENOUS
  Filled 2018-03-17: qty 1

## 2018-03-17 MED ORDER — LORAZEPAM 2 MG/ML IJ SOLN
1.0000 mg | INTRAMUSCULAR | Status: DC | PRN
  Administered 2018-03-17: 1 mg via INTRAVENOUS
  Filled 2018-03-17: qty 1

## 2018-03-17 MED ORDER — HEPARIN SODIUM (PORCINE) 1000 UNIT/ML DIALYSIS: 1000.0000 [IU] | INTRAMUSCULAR | Status: DC | PRN

## 2018-03-17 MED ORDER — OXYCODONE HCL 20 MG/ML PO CONC
5.0000 mg | ORAL | Status: DC | PRN
  Administered 2018-03-17: 5 mg via ORAL
  Filled 2018-03-17: qty 1

## 2018-03-17 MED ORDER — LIDOCAINE-PRILOCAINE 2.5-2.5 % EX CREA: 1.0000 "application " | TOPICAL_CREAM | CUTANEOUS | Status: DC | PRN

## 2018-03-17 MED ORDER — HYDROMORPHONE HCL 1 MG/ML IJ SOLN: 0.2500 mg | INTRAMUSCULAR | Status: DC | PRN

## 2018-03-17 MED ORDER — LIDOCAINE HCL (PF) 2 % IJ SOLN
INTRAMUSCULAR | Status: AC
  Filled 2018-03-17: qty 20

## 2018-03-17 MED ORDER — BIOTENE DRY MOUTH MT LIQD: 15.0000 mL | OROMUCOSAL | Status: DC | PRN

## 2018-03-17 MED ORDER — LORAZEPAM 1 MG PO TABS
1.0000 mg | ORAL_TABLET | ORAL | Status: DC | PRN
Start: 2018-03-17 — End: 2018-03-19

## 2018-03-17 MED ORDER — SODIUM CHLORIDE 0.9 % IV SOLN: 100.0000 mL | INTRAVENOUS | Status: DC | PRN

## 2018-03-17 MED ORDER — ONDANSETRON 4 MG PO TBDP: 4.0000 mg | ORAL_TABLET | Freq: Four times a day (QID) | ORAL | Status: DC | PRN

## 2018-03-17 MED ORDER — LIDOCAINE HCL (PF) 2 % IJ SOLN
INTRAMUSCULAR | Status: DC | PRN
  Administered 2018-03-17: 10 mL

## 2018-03-17 MED ORDER — MIDODRINE HCL 5 MG PO TABS
10.0000 mg | ORAL_TABLET | Freq: Once | ORAL | Status: AC
  Administered 2018-03-17: 10 mg via ORAL
  Filled 2018-03-17: qty 2

## 2018-03-17 MED ORDER — LORAZEPAM 2 MG/ML PO CONC: 1.0000 mg | ORAL | Status: DC | PRN

## 2018-03-17 MED ORDER — HALOPERIDOL 0.5 MG PO TABS
0.5000 mg | ORAL_TABLET | ORAL | Status: DC | PRN
  Filled 2018-03-17: qty 1

## 2018-03-17 MED ORDER — TRAMADOL HCL 50 MG PO TABS
50.0000 mg | ORAL_TABLET | Freq: Once | ORAL | Status: DC
  Filled 2018-03-17: qty 1

## 2018-03-17 MED ORDER — HALOPERIDOL LACTATE 5 MG/ML IJ SOLN: 0.5000 mg | INTRAMUSCULAR | Status: DC | PRN

## 2018-03-17 NOTE — Discharge Summary (Signed)
[facility] Discharge Summary  Patient name: [name] [name] Medical record number: [id number] Date of birth: 31-Mar-1951 Age: 67 y.o. Gender: female Date of Admission: 03/15/2018  Date of Discharge: 03/18/18 Admitting Physician: Kathrene Alu, MD  Primary Care Provider: Smiley Houseman, MD Consultants: palliative/oncology/nephrology  Indication for Hospitalization: pancreatic mass on CT with likely liver METS  Discharge Diagnoses/Problem List:  Pancreatic cancer Liver metastasis Lung lmetastasis ESRD on HD Malignant ascites  Disposition: to SNF with hospice   Discharge Condition: stable  Discharge Exam:  General:lying in bed, sleepy Eyes:EOMI, no scleral icterus ENTM:dry mucous membranes Cardiovascular:RRR. Systolic murmur appreciated. Respiratory:CTAB. Comfortable WOB. Gastrointestinal:soft, abdomen appears distended, diffusely mildly TTP without focal tenderness, does have moderate fluid ascities (no noticeable change from yesterday) Neuro:to sleepy for evaluation  Brief Hospital Course:  Patient admitted with abdominal pain/swelling after CT indicated likely pancreatic mass with liver/lung mets.  AV fistula was also clotted preventing initial attempt at HD for her ESRD.  After discussion with palliative/oncology and family, patient elected to undergo therapeutic paracentesis for ascites and transition to comfort care at [facility] where she lives with her sister.  Issues for Follow Up:  1. Patient has elected for comfort care in light of metastatic pancreatic cancer.  As she had her last HD treatment on Saturday her life expectancy is likely days.  Significant Procedures: therapeutic paracentesis, ~1L  Significant Labs and Imaging:  Recent Labs  Lab 03/15/18 1136 03/16/18 0406 03/17/18 0231  WBC 6.0 5.3 6.5  HGB 12.8 10.6* 12.1  HCT 37.3 30.5* 35.2*  PLT 183 154 203   Recent Labs  Lab 03/15/18 1352  03/16/18 0406 03/17/18 0231  NA 133* 135 132*  K 3.9 3.5 3.2*  CL 95* 98* 94*  CO2 22 24 25   GLUCOSE 90 75 84  BUN 38* 44* 50*  CREATININE 5.61* 6.19* 6.91*  CALCIUM 8.6* 8.4* 8.4*  PHOS  --   --  3.7  ALKPHOS 476* 418*  --   AST 52* 49*  --   ALT 24 22  --   ALBUMIN 2.4* 2.2* 2.2*    Ct Abdomen Pelvis Wo Contrast  Result Date: 03/15/2018 CLINICAL DATA:  67 y/o  F; abdominal pain for 2 months. EXAM: CT ABDOMEN AND PELVIS WITHOUT CONTRAST TECHNIQUE: Multidetector CT imaging of the abdomen and pelvis was performed following the standard protocol without IV contrast. COMPARISON:  03/17/2017 PET-CT. 02/28/2018 lumbar spine MRI. 02/15/2013 CT abdomen and pelvis. FINDINGS: Lower chest: Round atelectasis in the left lower lobe. Interval development of numerous pulmonary nodules throughout the lungs measuring up to 8 mm in the right lower lobe superior segment (series 3, image 10). Hepatobiliary: Interval development of multiple ill-defined lucent foci throughout the liver best appreciated at the dome (series 3, image 26). No biliary ductal dilatation. Pancreas: Mass centered within the body of pancreas measuring 6.3 x 3.9 x 3.1 cm (AP x ML x CC series 3, image 46 and series 7, image 105). Directly posteriorly there is a additional mass which appears discrete measuring 3.7 x 22 mm, possibly lymphadenopathy (series 3, image 45). Spleen: Normal in size without focal abnormality. Adrenals/Urinary Tract: Right nephrectomy. Atrophic left kidney. No hydronephrosis. Collapsed bladder. Stomach/Bowel: Stomach is within normal limits. Appendix appears normal. No evidence of bowel wall thickening, distention, or inflammatory changes. Sigmoid diverticulosis without findings of acute diverticulitis. Vascular/Lymphatic: Severe calcific atherosclerosis of the abdominal aorta. No adenopathy. Reproductive: Stable 10 mm calcified uterine myoma. Otherwise negative. Other: Small  left inguinal hernia containing ascitic  fluid. Moderate to large volume of peritoneal ascites. Diffuse subcutaneous edema compatible with anasarca. Musculoskeletal: No acute osseous abnormality is identified. No lytic or blastic lesion is evident. Multilevel spondylosis. IMPRESSION: 1. Mass centered in body of pancreas measuring 6.3 cm. 2. Multiple lucent foci throughout the liver, lymphadenopathy posterior to pancreas, and new pulmonary nodules throughout the lungs, probable metastatic disease. 3. Stable round atelectasis in left lower lobe. 4. Moderate to large volume peritoneal ascites.  Anasarca. 5. Sigmoid diverticulosis without findings of acute diverticulitis. 6. Severe aortic atherosclerosis. Electronically Signed   By: Kristine Garbe M.D.   On: 03/15/2018 18:08   Dg Chest 2 View  Result Date: 03/15/2018 CLINICAL DATA:  Weakness. EXAM: CHEST - 2 VIEW COMPARISON:  Chest x-ray dated February 19, 2017. FINDINGS: Stable mild cardiomegaly. Mild diffuse interstitial thickening. Small left pleural effusion. No consolidation or pneumothorax. No acute osseous abnormality. IMPRESSION: Mild interstitial pulmonary edema and small left pleural effusion. Electronically Signed   By: Titus Dubin M.D.   On: 03/15/2018 15:53   Ct Chest Wo Contrast  Result Date: 03/15/2018 CLINICAL DATA:  Pancreatic mass on CT abdomen/pelvis, adenocarcinoma suspected EXAM: CT CHEST WITHOUT CONTRAST TECHNIQUE: Multidetector CT imaging of the chest was performed following the standard protocol without IV contrast. COMPARISON:  Partial comparison to CT abdomen/pelvis dated 03/15/2018. CT chest dated 07/05/2017. FINDINGS: Cardiovascular: Mild cardiomegaly.  No pericardial effusion. No evidence of thoracic aortic aneurysm. Atherosclerotic calcifications of the aortic arch. Three vessel coronary atherosclerosis. Mediastinum/Nodes: No suspicious mediastinal lymphadenopathy. Visualized thyroid is unremarkable. Lungs/Pleura: Evaluation lung parenchyma is constrained by  respiratory motion. Multiple bilateral pulmonary nodules, suspicious for metastases. Representative nodules include: --8 mm nodule in the right upper lobe (series 8/image 17) --8 mm nodule in the right upper lobe (series 8/image 24) --6 mm nodule in the superior segment left lower lobe (series 8/image 41) --8 mm nodule in right lower lobe (series 8/image 43) --9 mm nodule in the right lower lobe (series 8/image 46) Rounded atelectasis in the left lower lobe, chronic. No focal consolidation. No pleural effusion or pneumothorax. Upper Abdomen: Visualized upper abdomen is better evaluated on recent dedicated CT abdomen/pelvis, noting an incompletely visualized pancreatic mass (series 4/image 139). Musculoskeletal: Visualized osseous structures are within normal limits. IMPRESSION: Multiple bilateral pulmonary nodules, measuring up to 9 mm in the right lower lobe, suspicious for metastases. Pancreatic mass is incompletely visualized, better evaluated on recent CT abdomen/pelvis. Aortic Atherosclerosis (ICD10-I70.0). Electronically Signed   By: Julian Hy M.D.   On: 03/15/2018 21:38    Results/Tests Pending at Time of Discharge:   Discharge Medications:  Allergies as of 03/18/2018      Reactions   Aleve [naproxen Sodium] Other (See Comments)   Due to kidneys.   Contrast Media [iodinated Diagnostic Agents] Itching, Swelling, Other (See Comments)   REACTION: Redness Per patient 10/27/17 "Cannot use any type of dye."   Fentanyl Other (See Comments)   Facial swelling   Motrin [ibuprofen] Other (See Comments)   Due to kidneys.   Nsaids Other (See Comments)   KIDNEY FAILURE AND ONLY HAS PARTIAL LEFT KIDNEY REMAINING (RIGHT ONE TAKEN OUT)   Tape Other (See Comments)   Please use "paper" tape      Medication List    STOP taking these medications   acetaminophen 500 MG tablet Commonly known as:  TYLENOL   aspirin 81 MG EC tablet   atorvastatin 40 MG tablet Commonly known as:  LIPITOR  clopidogrel 75 MG tablet Commonly known as:  PLAVIX   loratadine 10 MG tablet Commonly known as:  CLARITIN   midodrine 10 MG tablet Commonly known as:  PROAMATINE   multivitamin Tabs tablet   nitroGLYCERIN 0.4 MG SL tablet Commonly known as:  NITROSTAT   pantoprazole 40 MG tablet Commonly known as:  PROTONIX   sevelamer carbonate 800 MG tablet Commonly known as:  RENVELA     TAKE these medications   antiseptic oral rinse Liqd Apply 15 mLs topically as needed for dry mouth.   fluticasone 50 MCG/ACT nasal spray Commonly known as:  FLONASE Place 1 spray into both nostrils 2 (two) times daily as needed for allergies or rhinitis. What changed:    when to take this  reasons to take this   haloperidol 0.5 MG tablet Commonly known as:  HALDOL Take 1 tablet (0.5 mg total) by mouth every 4 (four) hours as needed for agitation (or delirium).   HYDROmorphone 1 MG/ML injection Commonly known as:  DILAUDID Inject 0.25 mLs (0.25 mg total) into the vein every 3 (three) hours as needed for severe pain.   lidocaine 5 % Commonly known as:  LIDODERM Place 1 patch onto the skin daily. Remove & Discard patch within 12 hours or as directed by MD What changed:  additional instructions   LORazepam 1 MG tablet Commonly known as:  ATIVAN Take 1 tablet (1 mg total) by mouth every 4 (four) hours as needed for anxiety.   ondansetron 4 MG disintegrating tablet Commonly known as:  ZOFRAN-ODT Take 1 tablet (4 mg total) by mouth every 6 (six) hours as needed for nausea.   oxyCODONE 20 MG/ML concentrated solution Commonly known as:  ROXICODONE INTENSOL Take 0.3 mLs (6 mg total) by mouth every 2 (two) hours as needed for moderate pain (or dyspnea).   oxyCODONE-acetaminophen 5-325 MG tablet Commonly known as:  PERCOCET/ROXICET Take 1 tablet by mouth every 6 (six) hours as needed for severe pain.   senna 8.6 MG Tabs tablet Commonly known as:  SENOKOT Take 1 tablet (8.6 mg total) by mouth  at bedtime as needed for mild constipation.   traMADol 50 MG tablet Commonly known as:  ULTRAM Take 1 tablet (50 mg total) by mouth once for 1 dose. What changed:    when to take this  reasons to take this       Discharge Instructions: Please refer to Patient Instructions section of EMR for full details.  Patient was counseled important signs and symptoms that should prompt return to medical care, changes in medications, dietary instructions, activity restrictions, and follow up appointments.   Follow-Up Appointments: Follow-up Information    Your dialysis center. Schedule an appointment as soon as possible for a visit in 1 day.   Why:  FOR DIALYSIS       Schedule an appointment as soon as possible for a visit  with [facility].   Specialty:  Gastroenterology Contact information: [place] [id number] [phone]       Wyatt Portela, MD.   Specialty:  Oncology Contact information: [address] [place] [address] [place].   Contact information: [place], [place], DO 03/18/2018, 10:38 AM PGY-1, [place]

## 2018-03-17 NOTE — Procedures (Signed)
  US guided RLQ paracentesis  1 liter yellow fluid obtained  Pt tolerated well

## 2018-03-17 NOTE — Progress Notes (Signed)
Unable to access pt's AVF for HD tx tonight. Upon assess of access I found negative thrill and upon auscultation found negative bruit. Attempt cannulation despite this just in case pt had a very quiet access. Pt stuck 2x arterial and 1x venous and got no flash/blood return x3 and pulled clots from the cannulation attempts x3. Pt stated that she has" been having trouble w/ the access and its time to get it cleaned out again I guess".  Dr. Moshe Cipro paged and made aware of this and she returned page promptly. Report called to Tawni Carnes, RN

## 2018-03-17 NOTE — Progress Notes (Signed)
Family Medicine Teaching Service Daily Progress Note Intern Pager: [phone]  Patient name: [name] [name] Medical record number: [id number] Date of birth: 06-05-1951 Age: 67 y.o. Gender: female  Primary Care Provider: Smiley Houseman, MD Consultants: palliative/oncology/IR Code Status: partial (DNI)  Pt Overview and Major Events to Date:  [name] [name] is a 67 y.o. female presenting with abdominal pain, found to have a mass on her pancreas with likely metastasis. PMH is significant for HFpEF, ESRD, GI bleed, CAD, HTN, HLD, chronic cough.    Assessment and Plan: [name] [name] is a 67 y.o. female presenting with abdominal pain, found to have a mass on her pancreas with likely metastasis. PMH is significant for HFpEF, ESRD, GI bleed, CAD, HTN, HLD, chronic cough.    Pancreatic mass (likely cancer with liver/lung mets):  Pain tolerable on current regimen.  Onc on board, patient not chemo candidate.  Palliative family meeting 5/2 @10am .  Labs significant for electrolyte abnormalities likely due to patient's ESRD, alkaline phosphatase of 476, and bilirubin of 1.6. LFTs unremarkable and lipase WNL.  CT abdomen/pelvis showed a 6.3 cm mass on patient's pancreas with likely metastases to liver and lower lobes of lung.  CT chest w/ likely lung mets.  Also notable for large volume peritoneal ascites and anasarca.   - dilaudid 0.25 mg Q3H PRN (patient very drowsy on 0.05mg ) -will try tramadol 50 once for effect and consider scheduling - palliative care consulted - will consider IR consult for paracentesis (could help with diagnosis) after palliative/onc recs -oncology consulted  Diastolic CHF- stable: Last ECHO 06/19/2016: EF 45-50%, G2DD. Did not appreciate crackles on exam, no pedal edema - management of any volume overload with dialysis - daily weights  ESRD: Receives HD T/Th/Sat.  Last HD Saturday 4/27.  Access failed overnight 5/1.   - Continue home Renvela, Rena-Vit  - consulted  nephro for HD -nephro to guide access decision, current likelihood is IR to evaluate to de-clot  GI bleed-Hgb stable, no frank blood: Patient was FOBT + in the ED.  Has gotten an EGD in February 2018 that showed gastric antral vascular ectasias that were thought to be source of bleeding.  Patient also has diverticulosis.   Patient is not tachycardic. Appears to be stable from this standpoint. Denied GI bleeding per ED provider's note.  - hold ASA, plavix - SCDs for VTE prophylaxis  CAD s/p PCI w/ 4 drug eluding stents placed in 01/2016: Patient takes aspirin 81 mg and plavix 75 mg at home. - hold home medications d/t positive FOBT  Hypertension with a hx of hypotension: Had hypotension overnight 5/1.  Hx of Hypotension worsened on HD days. BP 106/44 on admission. - Continue home Midodrine 10mg  daily, at bedtime on M/W/F and before HD on T/Th/S  Hyperlipidemia: Well-controlled. Last lipid panel 09/2015: chol 93, HDL 34, LDL 43, TG 81 - Continue home Lipitor  Memory loss: AOx3 but little recollection of yesterday's conversations Per family baseline is full capacity.  If patient/family do not choose comfort care could consider brain scan for mets  Hearing loss: Patient with significant hearing loss that could also contribute to poor communication.  FEN/GI: regular diet Prophylaxis: SCDs  Disposition: pending palliative/onc recs, needs HD access as well  Subjective:  AOx3, still confused/not remembering conversations from yesterday.  Still with mild belly pain   Objective: Temp:  [97.7 F (36.5 C)-98.3 F (36.8 C)] 98.2 F (36.8 C) (05/02 0453) Pulse Rate:  [54-70] 61 (05/02 0455) Resp:  [16-18]  16 (05/02 0455) BP: (82-118)/(42-80) 111/80 (05/02 0455) SpO2:  [94 %-100 %] 97 % (05/02 0455) Weight:  [149 lb 11.1 oz (67.9 kg)] 149 lb 11.1 oz (67.9 kg) (05/02 0500) Physical Exam: General: lying in bed, conversational albeit occasionally confused with poor memory of  yesterday Eyes: EOMI, no scleral icterus  ENTM: MMM.  Neck: Supple. Full ROM.  Cardiovascular: RRR. Systolic murmur appreciated.  Respiratory: CTAB. Comfortable WOB.  Gastrointestinal: +BS, soft, abdomen appears distended, diffusely mildly TTP without focal tenderness, no rebound or guarding, does have moderate fluid ascities (no noticeable change from yesterday) MSK: No edema of LE present. Moves all extremities.  Derm: Warm and dry. Hyperpigmented areas on the LE.  Neuro: Alert and oriented to person & place. Hard of hearing.    Laboratory: Recent Labs  Lab 03/15/18 1136 03/16/18 0406 03/17/18 0231  WBC 6.0 5.3 6.5  HGB 12.8 10.6* 12.1  HCT 37.3 30.5* 35.2*  PLT 183 154 203   Recent Labs  Lab 03/15/18 1352 03/16/18 0406 03/17/18 0231  NA 133* 135 132*  K 3.9 3.5 3.2*  CL 95* 98* 94*  CO2 22 24 25   BUN 38* 44* 50*  CREATININE 5.61* 6.19* 6.91*  CALCIUM 8.6* 8.4* 8.4*  PROT 5.0* 4.6*  --   BILITOT 1.6* 1.0  --   ALKPHOS 476* 418*  --   ALT 24 22  --   AST 52* 49*  --   GLUCOSE 90 75 84    Imaging/Diagnostic Tests: Ct Abdomen Pelvis Wo Contrast  Result Date: 03/15/2018 CLINICAL DATA:  67 y/o  F; abdominal pain for 2 months. EXAM: CT ABDOMEN AND PELVIS WITHOUT CONTRAST TECHNIQUE: Multidetector CT imaging of the abdomen and pelvis was performed following the standard protocol without IV contrast. COMPARISON:  03/17/2017 PET-CT. 02/28/2018 lumbar spine MRI. 02/15/2013 CT abdomen and pelvis. FINDINGS: Lower chest: Round atelectasis in the left lower lobe. Interval development of numerous pulmonary nodules throughout the lungs measuring up to 8 mm in the right lower lobe superior segment (series 3, image 10). Hepatobiliary: Interval development of multiple ill-defined lucent foci throughout the liver best appreciated at the dome (series 3, image 26). No biliary ductal dilatation. Pancreas: Mass centered within the body of pancreas measuring 6.3 x 3.9 x 3.1 cm (AP x ML x CC  series 3, image 46 and series 7, image 105). Directly posteriorly there is a additional mass which appears discrete measuring 3.7 x 22 mm, possibly lymphadenopathy (series 3, image 45). Spleen: Normal in size without focal abnormality. Adrenals/Urinary Tract: Right nephrectomy. Atrophic left kidney. No hydronephrosis. Collapsed bladder. Stomach/Bowel: Stomach is within normal limits. Appendix appears normal. No evidence of bowel wall thickening, distention, or inflammatory changes. Sigmoid diverticulosis without findings of acute diverticulitis. Vascular/Lymphatic: Severe calcific atherosclerosis of the abdominal aorta. No adenopathy. Reproductive: Stable 10 mm calcified uterine myoma. Otherwise negative. Other: Small left inguinal hernia containing ascitic fluid. Moderate to large volume of peritoneal ascites. Diffuse subcutaneous edema compatible with anasarca. Musculoskeletal: No acute osseous abnormality is identified. No lytic or blastic lesion is evident. Multilevel spondylosis. IMPRESSION: 1. Mass centered in body of pancreas measuring 6.3 cm. 2. Multiple lucent foci throughout the liver, lymphadenopathy posterior to pancreas, and new pulmonary nodules throughout the lungs, probable metastatic disease. 3. Stable round atelectasis in left lower lobe. 4. Moderate to large volume peritoneal ascites.  Anasarca. 5. Sigmoid diverticulosis without findings of acute diverticulitis. 6. Severe aortic atherosclerosis. Electronically Signed   By: Kristine Garbe M.D.   On: 03/15/2018  18:08   Dg Chest 2 View  Result Date: 03/15/2018 CLINICAL DATA:  Weakness. EXAM: CHEST - 2 VIEW COMPARISON:  Chest x-ray dated February 19, 2017. FINDINGS: Stable mild cardiomegaly. Mild diffuse interstitial thickening. Small left pleural effusion. No consolidation or pneumothorax. No acute osseous abnormality. IMPRESSION: Mild interstitial pulmonary edema and small left pleural effusion. Electronically Signed   By: Titus Dubin  M.D.   On: 03/15/2018 15:53   Ct Chest Wo Contrast  Result Date: 03/15/2018 CLINICAL DATA:  Pancreatic mass on CT abdomen/pelvis, adenocarcinoma suspected EXAM: CT CHEST WITHOUT CONTRAST TECHNIQUE: Multidetector CT imaging of the chest was performed following the standard protocol without IV contrast. COMPARISON:  Partial comparison to CT abdomen/pelvis dated 03/15/2018. CT chest dated 07/05/2017. FINDINGS: Cardiovascular: Mild cardiomegaly.  No pericardial effusion. No evidence of thoracic aortic aneurysm. Atherosclerotic calcifications of the aortic arch. Three vessel coronary atherosclerosis. Mediastinum/Nodes: No suspicious mediastinal lymphadenopathy. Visualized thyroid is unremarkable. Lungs/Pleura: Evaluation lung parenchyma is constrained by respiratory motion. Multiple bilateral pulmonary nodules, suspicious for metastases. Representative nodules include: --8 mm nodule in the right upper lobe (series 8/image 17) --8 mm nodule in the right upper lobe (series 8/image 24) --6 mm nodule in the superior segment left lower lobe (series 8/image 41) --8 mm nodule in right lower lobe (series 8/image 43) --9 mm nodule in the right lower lobe (series 8/image 46) Rounded atelectasis in the left lower lobe, chronic. No focal consolidation. No pleural effusion or pneumothorax. Upper Abdomen: Visualized upper abdomen is better evaluated on recent dedicated CT abdomen/pelvis, noting an incompletely visualized pancreatic mass (series 4/image 139). Musculoskeletal: Visualized osseous structures are within normal limits. IMPRESSION: Multiple bilateral pulmonary nodules, measuring up to 9 mm in the right lower lobe, suspicious for metastases. Pancreatic mass is incompletely visualized, better evaluated on recent CT abdomen/pelvis. Aortic Atherosclerosis (ICD10-I70.0). Electronically Signed   By: Julian Hy M.D.   On: 03/15/2018 21:38     Sherene Sires, DO 03/17/2018, 6:34 AM PGY-1, [place] Intern pager: [phone], text pages welcome

## 2018-03-17 NOTE — Progress Notes (Addendum)
[facility] Progress Note   Subjective:  Unable to dialyze last night d/t clotted AVF   Objective Vitals:   03/17/18 0453 03/17/18 0455 03/17/18 0500 03/17/18 0921  BP: (!) 82/45 111/80  (!) 95/35  Pulse: 70 61  (!) 51  Resp: 16 16  18   Temp: 98.2 F (36.8 C)   97.7 F (36.5 C)  TempSrc: Oral   Oral  SpO2: 94% 97%  98%  Weight:   67.9 kg (149 lb 11.1 oz)    Physical Exam General: WNWD female NAD Heart: RRR 2/6 SEM  Lungs: CTAB  Abdomen: soft diffusely tender  Extremities: Trace LE edema Dialysis Access: LUA AVF No bruit/thrill   NW TTS 4h 180NRe  BFR 425/A1.5x EDW 67.5kg 2K/2.25Ca Profile 4 -L AVF No Heparin (hx GIB) -Hectorol 1 mcg IV TIW   Assessment: 1. Pancreatic mass with suspected liver/lung mets/ascites on CT   - Further eval per primary team. Palliative care consulted for [place] 2. Clotted AVF  3. ESRD - TTS.  4. Volume. Chronic hypotension on midodrine. Is at EDW by weights, but some volume on exam, will try challenge EDW down as tolerated  5. Anemia  - Hgb 12.1   FOBT+. No ESA as outpatient. Follow trend. 6. Metabolic bone disease -  Ca ok. Continue Hectorol/Renvela binder 7. Nutrition - Low albumin liberalize diet, pro supp, vitamins  8. DM 9. CHF EF 45-50%  10. CAD,multi-vessel disease s/p PCI, stents   Plan - pt has transitioned to comfort care now , no further dialysis, have cancelled orders for today.    Lynnda Child PA-C [facility] Pager [phone] 03/17/2018,9:38 AM  LOS: 0 days   Pt seen, examined and agree w A/P as above.  Kelly Splinter MD [facility] pager [phone]   03/17/2018, 4:49 PM    Additional Objective Labs: Basic Metabolic Panel: Recent Labs  Lab 03/15/18 1352 03/16/18 0406 03/17/18 0231  NA 133* 135 132*  K 3.9 3.5 3.2*  CL 95* 98* 94*  CO2 22 24 25   GLUCOSE 90 75 84  BUN 38* 44* 50*  CREATININE 5.61* 6.19* 6.91*  CALCIUM 8.6* 8.4* 8.4*  PHOS  --   --  3.7    CBC: Recent Labs  Lab 03/15/18 1136 03/16/18 0406 03/17/18 0231  WBC 6.0 5.3 6.5  NEUTROABS 4.6  --   --   HGB 12.8 10.6* 12.1  HCT 37.3 30.5* 35.2*  MCV 88.8 87.6 87.8  PLT 183 154 203   Blood Culture    Component Value Date/Time   SDES PLEURAL 11/30/2016 1212   SDES PLEURAL 11/30/2016 1212   SPECREQUEST LEFT 11/30/2016 1212   SPECREQUEST LEFT 11/30/2016 1212   CULT NO GROWTH 5 DAYS 11/30/2016 1212   REPTSTATUS 12/05/2016 FINAL 11/30/2016 1212   REPTSTATUS 12/01/2016 FINAL 11/30/2016 1212    Cardiac Enzymes: No results for input(s): CKTOTAL, CKMB, CKMBINDEX, TROPONINI in the last 168 hours. CBG: Recent Labs  Lab 03/16/18 0648  GLUCAP 77   Iron Studies: No results for input(s): IRON, TIBC, TRANSFERRIN, FERRITIN in the last 72 hours. Lab Results  Component Value Date   INR 1.08 12/21/2016   INR 1.10 01/30/2016   INR 1.14 04/25/2015   Medications: . sodium chloride    . sodium chloride     . atorvastatin  40 mg Oral q morning - 10a  . Chlorhexidine Gluconate Cloth  6 each Topical Q0600  . fluticasone  1 spray Each Nare BID  .  lidocaine  1 patch Transdermal Q24H  . loratadine  10 mg Oral Daily  . midodrine  10 mg Oral Daily  . multivitamin  1 tablet Oral QHS  . mupirocin ointment  1 application Nasal BID  . pantoprazole  40 mg Oral BID  . sevelamer carbonate  1,600 mg Oral TID WC  . traMADol  50 mg Oral Once

## 2018-03-17 NOTE — Progress Notes (Signed)
Daily Progress Note   Patient Name: [name] [name]       Date: 03/17/2018 DOB: 08/11/1951  Age: 67 y.o. MRN#: [id number] Attending Physician: Dickie La, MD Primary Care Physician: Smiley Houseman, MD Admit Date: 03/15/2018  Reason for Consultation/Follow-up: Establishing goals of care, Hospice Evaluation, Non pain symptom management, Pain control, Psychosocial/spiritual support, Terminal Care and Withdrawal of life-sustaining treatment  Subjective: More alert today, denies pain, nausea, anxiety. Tells me she is comfortable.   Length of Stay: 0  Current Medications: Scheduled Meds:  . lidocaine  1 patch Transdermal Q24H  . loratadine  10 mg Oral Daily  . traMADol  50 mg Oral Once    Continuous Infusions:   PRN Meds: acetaminophen, antiseptic oral rinse, fluticasone, haloperidol **OR** haloperidol **OR** haloperidol lactate, HYDROmorphone (DILAUDID) injection, LORazepam **OR** LORazepam **OR** LORazepam, ondansetron **OR** ondansetron (ZOFRAN) IV, oxyCODONE **OR** oxyCODONE, senna  Physical Exam  Constitutional: She is oriented to person, place, and time. No distress.  HENT:  Head: Normocephalic and atraumatic.  Pulmonary/Chest: Effort normal and breath sounds normal. No respiratory distress.  Abdominal: She exhibits distension. There is no tenderness.  Musculoskeletal:       Right lower leg: She exhibits no edema.       Left lower leg: She exhibits no edema.  Neurological: She is alert and oriented to person, place, and time.  Periods of confusion  Skin: Skin is warm and dry.  Psychiatric: Her affect is blunt. She is slowed. Cognition and memory are impaired.            Vital Signs: BP (!) 95/35 (BP Location: Right Arm)   Pulse (!) 51   Temp 97.7 F (36.5 C) (Oral)   Resp 18    Wt 67.9 kg (149 lb 11.1 oz)   SpO2 98%   BMI 26.52 kg/m  SpO2: SpO2: 98 % O2 Device: O2 Device: Room Air O2 Flow Rate:    Intake/output summary:   Intake/Output Summary (Last 24 hours) at 03/17/2018 1148 Last data filed at 03/17/2018 0601 Gross per 24 hour  Intake 240 ml  Output 0 ml  Net 240 ml   LBM:   Baseline Weight: Weight: 67.9 kg (149 lb 11.1 oz) Most recent weight: Weight: 67.9 kg (149 lb 11.1 oz)       Palliative Assessment/Data: PPS 40%    Flowsheet Rows     Most Recent Value  Intake Tab  Referral Department  Hospitalist  Unit at Time of Referral  Med/Surg Unit  Palliative Care Primary Diagnosis  Cancer  Date Notified  03/15/18  Palliative Care Type  New Palliative care  Reason for referral  Clarify Goals of Care  Date of Admission  03/15/18  Date first seen by Palliative Care  03/16/18  # of days Palliative referral response time  1 Day(s)  # of days IP prior to Palliative referral  0  Clinical Assessment  Palliative Performance Scale Score  40%  Psychosocial & Spiritual Assessment  Palliative Care Outcomes  Patient/Family meeting held?  Yes  Who was at the meeting?  sister, SIL, brother  Palliative Care Outcomes  Provided psychosocial or spiritual support, ACP counseling assistance  Patient Active Problem List   Diagnosis Date Noted  . Carcinoma of pancreas metastatic to liver ([place])   . Malignant neoplasm metastatic to lung ([place])   . Malignant ascites   . Counseling regarding advance care planning and goals of care   . Pancreatic mass   . Palliative care by specialist   . Goals of care, counseling/discussion   . Abdominal pain   . Pancreatic cancer metastasized to lung ([place]) 03/15/2018  . Osteopenia 10/19/2017  . Atelectasis 03/08/2017  . Pleural effusion 01/13/2017  . GAVE (gastric antral vascular ectasia)   . Diverticulosis of colon without hemorrhage   . Acute blood loss anemia   . GI bleed 12/22/2016  . Upper GI bleed 12/22/2016  .  Antiplatelet or antithrombotic long-term use   . Cardiomyopathy, ischemic 08/09/2016  . Unstable angina ([place]) 02/04/2016  . Coronary artery disease involving native coronary artery of native heart without angina pectoris 02/03/2016  . Chronic cough 12/11/2015  . Hypoxia 12/03/2015  . Facial cellulitis 12/03/2015  . Angioedema 12/02/2015  . Hyperlipidemia   . Diabetes mellitus type 2 in obese ([place]) 05/07/2015  . CAD in native artery 04/25/2015  . Syncope 04/23/2015  . Moderate nonproliferative diabetic retinopathy(362.05) 07/16/2014  . Memory loss 06/12/2014  . ESRD on dialysis ([place]) 03/16/2013  . Nausea and vomiting 02/14/2013  . Severe anemia 02/14/2013  . Depression 08/28/2011  . Constipation 04/18/2011  . Low blood pressure 04/18/2011    Palliative Care Assessment & Plan   HPI: 67 y.o. female  with past medical history of ESRD on HD, CHF, GI bleed, CAD, HTN, HLD, and hearing loss admitted on 03/15/2018 with abdominal pain. Patient found to have a pancreatic mass with suspected lung and liver mets. Ascites and anasarca present. Patient lives in independent living with her sister, Manus Gunning.  PMT consulted for [place].  Assessment: I have reviewed medical records including EPIC notes, labs and imaging, assessed the patient and then met at the bedside along with Sister/HCPOA, Manus Gunning, patient's brother and sister in law  to discuss diagnosis prognosis, [place], EOL wishes, disposition and options.   We discussed her current illness and what it means in the larger context of her on-going co-morbidities.  Natural disease trajectory and expectations at EOL were discussed. We discussed that discontinuation of HD shortens life expectancy.  She tells me "I've been telling God I'm ready to die" and "I just want to be comfortable". Also expresses desires to discontinue HD.   I attempted to elicit values and goals of care important to the patient. At this time, she wants to focus on comfort and quality of  life. She tells me her faith/relationship with God is important to her.    The difference between aggressive medical intervention and comfort care was considered in light of the patient's goals of care. Patient and family agree to focus on comfort.   Advanced directives, concepts specific to code status, artifical feeding and hydration, and rehospitalization were considered and discussed. Patient and family elect DNR status. No artificial feeding/hydration, focus on comfort.   Hospice and Palliative Care services outpatient were explained and offered. Patient wants to go back to [facility] with Hospice support. Family agrees.   She did ask for a paracentesis to relieve discomfort before leaving facility.   Questions and concerns were addressed. The family was encouraged to call with questions or concerns.   Recommendations/Plan:  Comfort care  Social work consult for hospice at [place] w/ dr. Criss Rosales  about request for paracentesis prior to discharge  No further HD or IR intervention w/ fistula  Symptom management: roxicodone 5 mg PO q2hr PRN mod pain, 0.25 mg dilaudid IV  q3hr PRN severe pain, ativan 1 mg q4hr PRN anxiety, haldol 0.5 mg q4hr PRN agitation/delerium, zofram 4 mg q6hr PRN nausea, senna prn   Goals of Care and Additional Recommendations:  Limitations on Scope of Treatment: Avoid Hospitalization, Full Comfort Care, Minimize Medications, Initiate Comfort Feeding, No Artificial Feeding, No Chemotherapy, No Diagnostics, No Glucose Monitoring, No Hemodialysis, No IV Antibiotics, No IV Fluids, No Lab Draws, No Radiation and No Surgical Procedures  Code Status:  DNR  Prognosis:   < 2 weeks d/t ESRD and discontinuation of HD  Discharge Planning:  Home with Hospice ([facility])  Care plan was discussed with patient, sister Manus Gunning, brother [name], sister in law, Dr. Criss Rosales, bedside nurse  Thank you for allowing the Palliative Medicine  Team to assist in the care of this patient.   Time In: 1000 Time Out: 1130 Total Time 90 minutes Prolonged Time Billed  yes       Greater than 50%  of this time was spent counseling and coordinating care related to the above assessment and plan.  Juel Burrow, DNP, AGNP-C Palliative Medicine Team Team Phone # [phone]

## 2018-03-17 NOTE — Care Management Note (Addendum)
Case Management Note  Patient Details  Name: [name] [name] MRN: [id number] Date of Birth: Feb 16, 1951  Subjective/Objective:    History for HFpEF, ESRD, GI bleed, CAD, HTN, HLD, chronic cough. Admitted with Abdominal pain 2/2 pancreatic mass          Action/Plan: Prior to admission patient resided at [facility]. 1:54 pm NCM left message requesting return call regarding patient discharge planning for hospice with "Community Outreach Specialist: Philippa Sicks at [phone]".  In to speak with patient, attempted to discuss discharge planning for Home Hospice.  Patient stated her sister is gone for the day.  NCM left Home hospice provider list in patient room per her request; patient to review with sister tomorrow 03/17/18. Call placed to [facility] [phone] spoke with Nicole Kindred at [facility] said patient sister was their earlier advising them that patient was coming back today.  NCM will continue to follow for discharge needs.  Expected Discharge Date:   To be determined           Expected Discharge Plan:    ALF w/ hospice.  In-House Referral:  Clinical Social Work  Ship broker  CM Consult  Post Acute Care Choice:   Home hospice Choice offered to:   Patient; pending  DME Arranged:    DME Agency:     HH Arranged:    HH Agency:     Status of Service:  In process, will continue to follow   Kristen Cardinal, RN 03/17/2018, 1:38 PM

## 2018-03-18 ENCOUNTER — Telehealth: Payer: Self-pay | Admitting: Internal Medicine

## 2018-03-18 DIAGNOSIS — C787 Secondary malignant neoplasm of liver and intrahepatic bile duct: Secondary | ICD-10-CM | POA: Diagnosis not present

## 2018-03-18 DIAGNOSIS — C259 Malignant neoplasm of pancreas, unspecified: Secondary | ICD-10-CM | POA: Diagnosis not present

## 2018-03-18 DIAGNOSIS — R109 Unspecified abdominal pain: Secondary | ICD-10-CM | POA: Diagnosis not present

## 2018-03-18 DIAGNOSIS — Z515 Encounter for palliative care: Secondary | ICD-10-CM | POA: Diagnosis not present

## 2018-03-18 MED ORDER — HALOPERIDOL LACTATE 2 MG/ML PO CONC: 0.6000 mg | ORAL | 0 refills | Status: AC | PRN

## 2018-03-18 MED ORDER — TRAMADOL HCL 50 MG PO TABS: 50.0000 mg | ORAL_TABLET | Freq: Once | ORAL | Status: DC

## 2018-03-18 MED ORDER — HALOPERIDOL 0.5 MG PO TABS: 0.5000 mg | ORAL_TABLET | ORAL | Status: DC | PRN

## 2018-03-18 MED ORDER — FLUTICASONE PROPIONATE 50 MCG/ACT NA SUSP: 1.0000 | Freq: Two times a day (BID) | NASAL | 2 refills | Status: AC | PRN

## 2018-03-18 MED ORDER — BIOTENE DRY MOUTH MT LIQD: 15.0000 mL | OROMUCOSAL | Status: AC | PRN

## 2018-03-18 MED ORDER — OXYCODONE HCL 20 MG/ML PO CONC: 6.0000 mg | ORAL | 0 refills | Status: AC | PRN

## 2018-03-18 MED ORDER — HYDROMORPHONE HCL 1 MG/ML IJ SOLN: 0.2500 mg | INTRAMUSCULAR | 0 refills | Status: AC | PRN

## 2018-03-18 MED ORDER — HYDROMORPHONE HCL 1 MG/ML IJ SOLN: 0.2500 mg | INTRAMUSCULAR | 0 refills | Status: DC | PRN

## 2018-03-18 MED ORDER — ONDANSETRON 4 MG PO TBDP: 4.0000 mg | ORAL_TABLET | Freq: Four times a day (QID) | ORAL | 0 refills | Status: AC | PRN

## 2018-03-18 MED ORDER — SENNA 8.6 MG PO TABS: 1.0000 | ORAL_TABLET | Freq: Every evening | ORAL | 0 refills | Status: AC | PRN

## 2018-03-18 MED ORDER — LORAZEPAM 2 MG/ML PO CONC: 1.0000 mg | ORAL | 0 refills | Status: AC | PRN

## 2018-03-18 MED ORDER — LORAZEPAM 1 MG PO TABS: 1.0000 mg | ORAL_TABLET | ORAL | 0 refills | Status: DC | PRN

## 2018-03-18 MED ORDER — OXYCODONE HCL 20 MG/ML PO CONC: 6.0000 mg | ORAL | 0 refills | Status: DC | PRN

## 2018-03-18 MED ORDER — LIDOCAINE 5 % EX PTCH: 1.0000 | MEDICATED_PATCH | CUTANEOUS | 0 refills | Status: DC

## 2018-03-18 NOTE — Progress Notes (Addendum)
[facility] Liaison:  Notified by [facility] of family request for Hospice and [place] services at home after discharge. Chart and patient information reviewed and  hospice eligibility, confirmed by Dr. Shelbie Proctor.   Spoke with Manus Gunning, sister to initiate education related to hospice philosophy, services and team approach to care. Family verbalized understanding of the information provided. Per discussion, plan is for discharge to home by [facility] today.   Please send signed completed DNR form home with patient. Patient will need prescriptions for discharge comfort medications.   DME needs discussed and family requested hospital bed with 1/2 rails and OBT for delivery to the home today. [facility] equipment manager Jewel Ysidro Evert notified and will contact [facility] to arrange delivery to the home. The home address has been verified and is correct in the chart; Manus Gunning is family member to be contacted to arrange time of delivery.  Hospital bed needs delivered prior to hospital discharge.   [facility]erral [facility] aware of the above. Completed discharge summary will need to be faxed to [facility] at [phone] when final. Please notify [facility] when patient is ready to leave unit at discharge-call [phone].   [facility] information and contact numbers have been given to [place] during visit.  Please call with any questions.   Thank You, Freddi Starr RN, [facility] Liaison [phone]

## 2018-03-18 NOTE — Progress Notes (Signed)
Notification from [facility] that the bed is delivered to patient's residence. Notification to [facility] for pick-up. Dorthey Sawyer, RN

## 2018-03-18 NOTE — Progress Notes (Addendum)
Spoke with Sister [name] [name] discussed recommendation of hospice and she selected [facility].  States she call them but they stated they need a referral.  NCM advised her that she would contact them as soon as the ends.  [name] [name] states they will need DME: hospital bed.  Referral for Home Hospice called [facility] Liaison "Amy", also provided [name] [name]'s contact information.  Expected Discharge Date:  03/18/18               Expected Discharge Plan:  Home w Hospice Care  In-House Referral:  Clinical Social Work, Hospice / Palliative Care  Discharge planning Services  CM Consult  Post Acute Care Choice:  Hospice Choice offered to:  Sibling, Patient  DME Arranged:  Hospital bed DME Agency:  Hospice and [place]:    [facility] Agency:  Hospice and [place]  Status of Service:  Completed, signed off  Montel Culver Nurse Case Freight forwarder

## 2018-03-18 NOTE — Telephone Encounter (Signed)
[name] [name] with [facility] called wanting to know if [name] will be the attending for this pt once she goes to Hospice. Pt is discharging from the hospital today. Please call [name] [name] back by the end of the day today with these orders. Please advise Her number is [phone]

## 2018-03-18 NOTE — Progress Notes (Signed)
Family Medicine Teaching Service Daily Progress Note Intern Pager: [phone]  Patient name: [name] [name] Medical record number: [id number] Date of birth: 03-12-1951 Age: 67 y.o. Gender: female  Primary Care Provider: Smiley Houseman, MD Consultants: palliative/oncology/IR Code Status: DNR  Pt Overview and Major Events to Date:  [name] [name] is a 67 y.o. female presenting with abdominal pain, found to have a mass on her pancreas with likely metastasis. PMH is significant for HFpEF, ESRD, GI bleed, CAD, HTN, HLD, chronic cough.   5/2 Given prognosis, patient met with palliative and elected to undergo therapeutic paracentesis and then transition to comfort care with hospice at [facility]  Assessment and Plan: [name] [name] is a 67 y.o. female presenting with abdominal pain, found to have a mass on her pancreas with likely metastasis. PMH is significant for HFpEF, ESRD, GI bleed, CAD, HTN, HLD, chronic cough.    Pancreatic mass (likely cancer with liver/lung mets):  Pain tolerable on current regimen.  Theraputic paracentesis 5/2. -patient elects comfort care and d/c to hospice at [facility]  Diastolic CHF- stable: Last ECHO 06/19/2016: EF 45-50%, G2DD. Did not appreciate crackles on exam, no pedal edema - management of any volume overload with dialysis - daily weights  ESRD:Last HD Saturday 4/27.  - Patient stopping HD  GI bleed-Hgb stable, no frank blood: Patient was FOBT + in the ED.  Has gotten an EGD in February 2018 that showed gastric antral vascular ectasias that were thought to be source of bleeding.  Patient also has diverticulosis.   Patient is not tachycardic. Appears to be stable from this standpoint. Denied GI bleeding per ED provider's note.  - hold ASA, plavix - no SCDs for VTE prophylaxis  CAD s/p PCI w/ 4 drug eluding stents placed in 01/2016: Patient takes aspirin 81 mg and plavix 75 mg at home. - holding non-comfort meds  Hypertension with a  hx of hypotension: Had hypotension overnight 5/1.  Hx of Hypotension worsened on HD days. BP 106/44 on admission. - holding non-comfort meds  Hyperlipidemia: Well-controlled. Last lipid panel 09/2015: chol 93, HDL 34, LDL 43, TG 81 - holding non-comfort meds  Memory loss: AOx3 but little recollection of yesterday's conversations.  No further intervention  Hearing loss: Patient with significant hearing loss that could also contribute to poor communication.  FEN/GI: regular diet Prophylaxis: SCDs  Disposition: to hospice with [facility]  Subjective:  Patient was sleeping when I went by, woke up but was very groggy and did not want to talk.  Pleasant.  No pain complaint   Objective: Temp:  [97.5 F (36.4 C)-97.8 F (36.6 C)] 97.5 F (36.4 C) (05/03 0611) Pulse Rate:  [51-68] 57 (05/03 0611) Resp:  [16-18] 16 (05/03 0611) BP: (95-121)/(35-74) 121/62 (05/03 0611) SpO2:  [95 %-100 %] 100 % (05/03 0611) Weight:  [149 lb 11.1 oz (67.9 kg)] 149 lb 11.1 oz (67.9 kg) (05/02 2231) Physical Exam: General: lying in bed, sleepy Eyes: EOMI, no scleral icterus  ENTM: dry mucous membranes Cardiovascular: RRR. Systolic murmur appreciated.  Respiratory: CTAB. Comfortable WOB.  Gastrointestinal: soft, abdomen appears distended, diffusely mildly TTP without focal tenderness, does have moderate fluid ascities (no noticeable change from yesterday) Neuro: to sleepy for evaluation   Laboratory: Recent Labs  Lab 03/15/18 1136 03/16/18 0406 03/17/18 0231  WBC 6.0 5.3 6.5  HGB 12.8 10.6* 12.1  HCT 37.3 30.5* 35.2*  PLT 183 154 203   Recent Labs  Lab 03/15/18 1352 03/16/18 0406 03/17/18 0231  NA 133* 135 132*  K 3.9 3.5 3.2*  CL 95* 98* 94*  CO2 '22 24 25  '$ BUN 38* 44* 50*  CREATININE 5.61* 6.19* 6.91*  CALCIUM 8.6* 8.4* 8.4*  PROT 5.0* 4.6*  --   BILITOT 1.6* 1.0  --   ALKPHOS 476* 418*  --   ALT 24 22  --   AST 52* 49*  --   GLUCOSE 90 75 84    Imaging/Diagnostic  Tests: Ct Abdomen Pelvis Wo Contrast  Result Date: 03/15/2018 CLINICAL DATA:  67 y/o  F; abdominal pain for 2 months. EXAM: CT ABDOMEN AND PELVIS WITHOUT CONTRAST TECHNIQUE: Multidetector CT imaging of the abdomen and pelvis was performed following the standard protocol without IV contrast. COMPARISON:  03/17/2017 PET-CT. 02/28/2018 lumbar spine MRI. 02/15/2013 CT abdomen and pelvis. FINDINGS: Lower chest: Round atelectasis in the left lower lobe. Interval development of numerous pulmonary nodules throughout the lungs measuring up to 8 mm in the right lower lobe superior segment (series 3, image 10). Hepatobiliary: Interval development of multiple ill-defined lucent foci throughout the liver best appreciated at the dome (series 3, image 26). No biliary ductal dilatation. Pancreas: Mass centered within the body of pancreas measuring 6.3 x 3.9 x 3.1 cm (AP x ML x CC series 3, image 46 and series 7, image 105). Directly posteriorly there is a additional mass which appears discrete measuring 3.7 x 22 mm, possibly lymphadenopathy (series 3, image 45). Spleen: Normal in size without focal abnormality. Adrenals/Urinary Tract: Right nephrectomy. Atrophic left kidney. No hydronephrosis. Collapsed bladder. Stomach/Bowel: Stomach is within normal limits. Appendix appears normal. No evidence of bowel wall thickening, distention, or inflammatory changes. Sigmoid diverticulosis without findings of acute diverticulitis. Vascular/Lymphatic: Severe calcific atherosclerosis of the abdominal aorta. No adenopathy. Reproductive: Stable 10 mm calcified uterine myoma. Otherwise negative. Other: Small left inguinal hernia containing ascitic fluid. Moderate to large volume of peritoneal ascites. Diffuse subcutaneous edema compatible with anasarca. Musculoskeletal: No acute osseous abnormality is identified. No lytic or blastic lesion is evident. Multilevel spondylosis. IMPRESSION: 1. Mass centered in body of pancreas measuring 6.3 cm. 2.  Multiple lucent foci throughout the liver, lymphadenopathy posterior to pancreas, and new pulmonary nodules throughout the lungs, probable metastatic disease. 3. Stable round atelectasis in left lower lobe. 4. Moderate to large volume peritoneal ascites.  Anasarca. 5. Sigmoid diverticulosis without findings of acute diverticulitis. 6. Severe aortic atherosclerosis. Electronically Signed   By: Kristine Garbe M.D.   On: 03/15/2018 18:08   Dg Chest 2 View  Result Date: 03/15/2018 CLINICAL DATA:  Weakness. EXAM: CHEST - 2 VIEW COMPARISON:  Chest x-ray dated February 19, 2017. FINDINGS: Stable mild cardiomegaly. Mild diffuse interstitial thickening. Small left pleural effusion. No consolidation or pneumothorax. No acute osseous abnormality. IMPRESSION: Mild interstitial pulmonary edema and small left pleural effusion. Electronically Signed   By: Titus Dubin M.D.   On: 03/15/2018 15:53   Ct Chest Wo Contrast  Result Date: 03/15/2018 CLINICAL DATA:  Pancreatic mass on CT abdomen/pelvis, adenocarcinoma suspected EXAM: CT CHEST WITHOUT CONTRAST TECHNIQUE: Multidetector CT imaging of the chest was performed following the standard protocol without IV contrast. COMPARISON:  Partial comparison to CT abdomen/pelvis dated 03/15/2018. CT chest dated 07/05/2017. FINDINGS: Cardiovascular: Mild cardiomegaly.  No pericardial effusion. No evidence of thoracic aortic aneurysm. Atherosclerotic calcifications of the aortic arch. Three vessel coronary atherosclerosis. Mediastinum/Nodes: No suspicious mediastinal lymphadenopathy. Visualized thyroid is unremarkable. Lungs/Pleura: Evaluation lung parenchyma is constrained by respiratory motion. Multiple bilateral pulmonary nodules, suspicious for metastases. Representative nodules  include: --8 mm nodule in the right upper lobe (series 8/image 17) --8 mm nodule in the right upper lobe (series 8/image 24) --6 mm nodule in the superior segment left lower lobe (series 8/image 41)  --8 mm nodule in right lower lobe (series 8/image 43) --9 mm nodule in the right lower lobe (series 8/image 46) Rounded atelectasis in the left lower lobe, chronic. No focal consolidation. No pleural effusion or pneumothorax. Upper Abdomen: Visualized upper abdomen is better evaluated on recent dedicated CT abdomen/pelvis, noting an incompletely visualized pancreatic mass (series 4/image 139). Musculoskeletal: Visualized osseous structures are within normal limits. IMPRESSION: Multiple bilateral pulmonary nodules, measuring up to 9 mm in the right lower lobe, suspicious for metastases. Pancreatic mass is incompletely visualized, better evaluated on recent CT abdomen/pelvis. Aortic Atherosclerosis (ICD10-I70.0). Electronically Signed   By: Julian Hy M.D.   On: 03/15/2018 21:38     Sherene Sires, DO 03/18/2018, 6:42 AM PGY-1, [place] Intern pager: [phone], text pages welcome

## 2018-03-18 NOTE — Telephone Encounter (Signed)
Called back and spoke with the nurse on call. Unfortunately, I will not be able to be the attending.

## 2018-03-18 NOTE — Evaluation (Signed)
Physical Therapy Evaluation Patient Details Name: [name] [name] MRN: [id number] DOB: [birth year] Today's Date: 03/18/2018   History of Present Illness  Pt is a 67 y/o female admitted secondary to increased abdominal pain. Imaging revealed mass on pancreas, with widespread metastasis. Pt s/p therapeutic paracentsis on 5/2. PMH includes ESRD on HD, CAD, HTN, CHF, DM, ischemic cardiomyopathy, and memory loss.   Clinical Impression  Pt admitted secondary to problem above with deficits below. Pt limited secondary to dizziness when standing. Required gross mod A for mobility. Per notes, pt to transition home with hospice, however, feel there are concerns with mobility, as pt does not have 24/7 assist. Feel pt will be unable to reposition herself to prevent pressure injuries to skin. Also, feel pt will be unable to ambulate to bathroom independently and may soil her bed which would lead to further skin breakdown. Without 24/7 assist feel pt will not be able to perform necessary mobility/ADL/IADL tasks safely. Question if residential hospice would be an option for d/c for pt? If not a candidate, and pt does not have 24/7 support, recommend SNF at d/c. As pt transitioning to comfort care, will not need further skilled PT intervention. Will sign off at this time, however, strongly recommend 24/7 support at d/c.     Follow Up Recommendations Supervision/Assistance - 24 hour;SNF;Other (comment)(??residential hospice)    Equipment Recommendations  Hospital bed    Recommendations for Other Services       Precautions / Restrictions Precautions Precautions: Fall Restrictions Weight Bearing Restrictions: No      Mobility  Bed Mobility Overal bed mobility: Needs Assistance Bed Mobility: Supine to Sit     Supine to sit: Mod assist     General bed mobility comments: Mod a for trunk elevation and for scooting hips to EOB.   Transfers Overall transfer level: Needs assistance Equipment used: Rolling  walker (2 wheeled) Transfers: Sit to/from Stand Sit to Stand: Mod assist         General transfer comment: Mod A for lift assist and steadying. Pt with dizziness upon standing, therefore further mobility deferred.   Ambulation/Gait         Gait velocity: Deferred      Stairs            Wheelchair Mobility    Modified Rankin (Stroke Patients Only)       Balance Overall balance assessment: Needs assistance Sitting-balance support: Feet supported;No upper extremity supported Sitting balance-Leahy Scale: Fair     Standing balance support: Bilateral upper extremity supported;During functional activity Standing balance-Leahy Scale: Poor Standing balance comment: Reliant on BUE support and external support.                              Pertinent Vitals/Pain Pain Assessment: No/denies pain    Home Living Family/patient expects to be discharged to:: Private residence Living Arrangements: Alone Available Help at Discharge: Family;Available PRN/intermittently Type of Home: Independent living facility Home Access: Level entry     Home Layout: One level Home Equipment: Walker - 4 wheels      Prior Function Level of Independence: Independent with assistive device(s)         Comments: Used rollator for ambulation.      Hand Dominance        Extremity/Trunk Assessment   Upper Extremity Assessment Upper Extremity Assessment: Generalized weakness    Lower Extremity Assessment Lower Extremity Assessment: Generalized weakness    Cervical /  Trunk Assessment Cervical / Trunk Assessment: Kyphotic  Communication   Communication: No difficulties  Cognition Arousal/Alertness: Lethargic Behavior During Therapy: WFL for tasks assessed/performed Overall Cognitive Status: History of cognitive impairments - at baseline                                 General Comments: Pt very sleepy throughout, but arousable.       General  Comments      Exercises     Assessment/Plan    PT Assessment Patent does not need any further PT services  PT Problem List         PT Treatment Interventions      PT Goals (Current goals can be found in the Care Plan section)  Acute Rehab PT Goals Patient Stated Goal: none stated  PT Goal Formulation: Patient unable to participate in goal setting Time For Goal Achievement: 03/18/18 Potential to Achieve Goals: Poor    Frequency     Barriers to discharge        Co-evaluation               AM-PAC PT "6 Clicks" Daily Activity  Outcome Measure Difficulty turning over in bed (including adjusting bedclothes, sheets and blankets)?: A Lot Difficulty moving from lying on back to sitting on the side of the bed? : Unable Difficulty sitting down on and standing up from a chair with arms (e.g., wheelchair, bedside commode, etc,.)?: Unable Help needed moving to and from a bed to chair (including a wheelchair)?: A Lot Help needed walking in hospital room?: A Lot Help needed climbing 3-5 steps with a railing? : Total 6 Click Score: 9    End of Session Equipment Utilized During Treatment: Gait belt Activity Tolerance: Treatment limited secondary to medical complications (Comment)(dizziness ) Patient left: in bed;with call bell/phone within reach;with bed alarm set Nurse Communication: Mobility status PT Visit Diagnosis: Other abnormalities of gait and mobility (R26.89)    Time: 0923-3007 PT Time Calculation (min) (ACUTE ONLY): 23 min   Charges:   PT Evaluation $PT Eval Moderate Complexity: 1 Mod PT Treatments $Therapeutic Activity: 8-22 mins   PT G Codes:        Leighton Ruff, PT, DPT  Acute Rehabilitation Services  Pager: [place] 03/18/2018, 3:43 PM

## 2018-03-18 NOTE — Telephone Encounter (Signed)
Will forward to MD to advise. Jerzee Jerome,CMA  

## 2018-03-19 NOTE — Progress Notes (Signed)
MD wrote scripts for patient's home medications.  Spoke with Hospice RN who will meet patient and patient's sister at [place].  [facility] here to transport patient.  All belongings went with the patient.  Earleen Reaper RN-BC, [employer]

## 2018-03-21 ENCOUNTER — Ambulatory Visit: Payer: Medicare Other | Admitting: Physician Assistant

## 2018-03-22 ENCOUNTER — Telehealth: Payer: Self-pay

## 2018-04-16 NOTE — Telephone Encounter (Signed)
[name] [name] with Hospice and [facility] left message on nurse line to inform PCP that patient passed away today at 2:53 pm. If you have any questions call back is [phone]. Danley Danker, RN [facility] RN)

## 2018-04-16 DEATH — deceased

## 2018-06-15 ENCOUNTER — Telehealth: Payer: Self-pay | Admitting: Emergency Medicine

## 2018-06-15 NOTE — Telephone Encounter (Signed)
[name] [name], [name] [name], [place]        [name] [name] per our conversation this patient is due for a CT Chest w/o per Dr. Lamonte Sakai. [name] [name] did a CT chest w/o 03/15/2018. Does the patient still need to do the CT for Dr. Lamonte Sakai   Thanks,  [name] [name]   ------------------------------------ [place] please advise, you wanted to the pt to have repeat CT scan in August 2019. Thanks!

## 2018-06-21 NOTE — Telephone Encounter (Signed)
Looks like Ms Costilow passed away. Will close this note
